# Patient Record
Sex: Female | Born: 1945
Health system: Southern US, Community
[De-identification: ages and names within clinical notes are randomized; demographics above are authoritative.]

## PROBLEM LIST (undated history)

## (undated) DIAGNOSIS — I10 Essential (primary) hypertension: Secondary | ICD-10-CM

## (undated) DIAGNOSIS — M1990 Unspecified osteoarthritis, unspecified site: Secondary | ICD-10-CM

## (undated) DIAGNOSIS — E119 Type 2 diabetes mellitus without complications: Secondary | ICD-10-CM

## (undated) DIAGNOSIS — J449 Chronic obstructive pulmonary disease, unspecified: Secondary | ICD-10-CM

## (undated) DIAGNOSIS — G8220 Paraplegia, unspecified: Secondary | ICD-10-CM

## (undated) DIAGNOSIS — K219 Gastro-esophageal reflux disease without esophagitis: Secondary | ICD-10-CM

## (undated) DIAGNOSIS — E079 Disorder of thyroid, unspecified: Secondary | ICD-10-CM

## (undated) DIAGNOSIS — N12 Tubulo-interstitial nephritis, not specified as acute or chronic: Secondary | ICD-10-CM

## (undated) DIAGNOSIS — J4489 Other specified chronic obstructive pulmonary disease: Secondary | ICD-10-CM

## (undated) DIAGNOSIS — F319 Bipolar disorder, unspecified: Secondary | ICD-10-CM

## (undated) DIAGNOSIS — E669 Obesity, unspecified: Secondary | ICD-10-CM

## (undated) DIAGNOSIS — R0600 Dyspnea, unspecified: Secondary | ICD-10-CM

## (undated) HISTORY — PX: ABDOMINAL HYSTERECTOMY: SHX81

## (undated) HISTORY — PX: BACK SURGERY: SHX140

## (undated) HISTORY — PX: CARPAL TUNNEL RELEASE: SHX101

## (undated) HISTORY — PX: BUNIONECTOMY: SHX129

---

## 2006-06-28 ENCOUNTER — Ambulatory Visit: Payer: Self-pay | Admitting: Cardiology

## 2006-06-28 ENCOUNTER — Encounter: Payer: Self-pay | Admitting: Cardiology

## 2006-06-28 ENCOUNTER — Inpatient Hospital Stay (HOSPITAL_COMMUNITY): Admission: AD | Admit: 2006-06-28 | Discharge: 2006-07-06 | Payer: Self-pay | Admitting: Internal Medicine

## 2006-06-28 ENCOUNTER — Ambulatory Visit: Payer: Self-pay | Admitting: Pulmonary Disease

## 2006-06-29 ENCOUNTER — Encounter (INDEPENDENT_AMBULATORY_CARE_PROVIDER_SITE_OTHER): Payer: Self-pay | Admitting: Specialist

## 2006-06-30 ENCOUNTER — Encounter (INDEPENDENT_AMBULATORY_CARE_PROVIDER_SITE_OTHER): Payer: Self-pay | Admitting: Specialist

## 2006-07-01 ENCOUNTER — Encounter (INDEPENDENT_AMBULATORY_CARE_PROVIDER_SITE_OTHER): Payer: Self-pay | Admitting: Specialist

## 2006-07-03 ENCOUNTER — Ambulatory Visit: Payer: Self-pay | Admitting: Physical Medicine & Rehabilitation

## 2006-07-06 ENCOUNTER — Ambulatory Visit: Payer: Self-pay | Admitting: Internal Medicine

## 2006-07-06 ENCOUNTER — Inpatient Hospital Stay (HOSPITAL_COMMUNITY)
Admission: RE | Admit: 2006-07-06 | Discharge: 2006-07-28 | Payer: Self-pay | Admitting: Physical Medicine & Rehabilitation

## 2006-07-06 ENCOUNTER — Ambulatory Visit: Payer: Self-pay | Admitting: Physical Medicine & Rehabilitation

## 2006-08-09 ENCOUNTER — Ambulatory Visit: Payer: Self-pay | Admitting: Cardiology

## 2006-08-18 ENCOUNTER — Ambulatory Visit: Payer: Self-pay | Admitting: Physician Assistant

## 2006-09-13 ENCOUNTER — Ambulatory Visit (HOSPITAL_COMMUNITY): Admission: RE | Admit: 2006-09-13 | Discharge: 2006-09-13 | Payer: Self-pay | Admitting: Radiology

## 2007-05-16 ENCOUNTER — Ambulatory Visit: Payer: Self-pay | Admitting: Orthopedic Surgery

## 2007-05-16 DIAGNOSIS — G5600 Carpal tunnel syndrome, unspecified upper limb: Secondary | ICD-10-CM

## 2007-06-07 ENCOUNTER — Encounter: Payer: Self-pay | Admitting: Orthopedic Surgery

## 2007-06-25 ENCOUNTER — Ambulatory Visit: Payer: Self-pay | Admitting: Orthopedic Surgery

## 2007-07-10 ENCOUNTER — Ambulatory Visit (HOSPITAL_COMMUNITY): Admission: RE | Admit: 2007-07-10 | Discharge: 2007-07-10 | Payer: Self-pay | Admitting: Orthopedic Surgery

## 2007-07-10 ENCOUNTER — Encounter: Payer: Self-pay | Admitting: Orthopedic Surgery

## 2007-07-12 ENCOUNTER — Ambulatory Visit: Payer: Self-pay | Admitting: Orthopedic Surgery

## 2007-07-13 ENCOUNTER — Ambulatory Visit: Payer: Self-pay | Admitting: Orthopedic Surgery

## 2007-07-23 ENCOUNTER — Ambulatory Visit: Payer: Self-pay | Admitting: Orthopedic Surgery

## 2007-07-25 ENCOUNTER — Telehealth: Payer: Self-pay | Admitting: Orthopedic Surgery

## 2007-09-03 ENCOUNTER — Ambulatory Visit: Payer: Self-pay | Admitting: Orthopedic Surgery

## 2007-09-03 DIAGNOSIS — M654 Radial styloid tenosynovitis [de Quervain]: Secondary | ICD-10-CM | POA: Insufficient documentation

## 2007-09-03 DIAGNOSIS — M19049 Primary osteoarthritis, unspecified hand: Secondary | ICD-10-CM | POA: Insufficient documentation

## 2007-09-13 ENCOUNTER — Telehealth: Payer: Self-pay | Admitting: Orthopedic Surgery

## 2007-09-17 ENCOUNTER — Encounter: Payer: Self-pay | Admitting: Orthopedic Surgery

## 2007-09-18 ENCOUNTER — Encounter: Payer: Self-pay | Admitting: Orthopedic Surgery

## 2007-09-21 ENCOUNTER — Telehealth: Payer: Self-pay | Admitting: Orthopedic Surgery

## 2007-11-19 ENCOUNTER — Ambulatory Visit: Payer: Self-pay | Admitting: Orthopedic Surgery

## 2007-11-19 DIAGNOSIS — M25519 Pain in unspecified shoulder: Secondary | ICD-10-CM

## 2010-08-10 NOTE — H&P (Signed)
[name], [name]                ACCOUNT NO.:  [id number]   MEDICAL RECORD NO.:  [id number]          PATIENT TYPE:  AMB   LOCATION:  SDS                          FACILITY:  [facility]   PHYSICIAN:  Delton See, P.A.   DATE OF BIRTH:  1946/01/10   DATE OF ADMISSION:  09/13/2006  DATE OF DISCHARGE:                              HISTORY & PHYSICAL   ADDENDUM   Please see the history and physical dictated on September 01, 2006.   LABORATORY DATA:  INR is 1.0, PT 13.1, PTT 35.  CBC:  Reveals hemoglobin  11.8, hematocrit 35.2, WBCs 5.6, platelets 264,000, BUN 9, creatinine  0.59, GFR greater than 60, glucose 103, potassium 3.7.  A chest x-ray is  pending.   REVIEW OF SYSTEMS:  Completely negative except for some recent wheezing.  She had a headache yesterday which is unusual for her.  She has  bowel and bladder incontinence as a result of her spinal cord injury.  She had an indwelling Foley catheter.  She is a T11 paraplegic.  She  reports bruising easily on anti-platelet therapy.  She quit smoking in  April.   PHYSICAL EXAMINATION:  Reveals a pleasant, alert, 65 year old white  female in no acute distress.  VITAL SIGNS:  Blood pressure 130/86, pulse 64, respirations 20,  temperature 97.6.  HEENT:  Unremarkable.  Her airway is rated at a 1.  NECK:  Reveals no bruits.  HEART:  Reveals regular rate and rhythm without murmur.  LUNGS:  Reveal slightly decreased breath sounds with wheezing on the  right.  ABDOMEN:  Obese, soft, nontender.  EXTREMITIES:  Reveal pulses to be intact with trace edema.  Her ASA Scale is a 4.  NEUROLOGICAL EXAM:  Mental status:  Patient is alert and oriented and  follows commands.  Cranial nerves II-XII are grossly intact.  Sensation  is intact to light touch in the upper extremities.  She has no sensation  in the lower  extremities.  Motor strength is 5/5 in the upper extremities.  There is  no movement in either lower extremity except for an occasional spasm.  Cerebellar testing is intact in the upper extremities.   For impression and plan, please see the H&P dictated yesterday.      Delton See, P.A.     DR/MEDQ  D:  09/13/2006  T:  09/13/2006  Job:  [id number]   cc:   Danae Orleans. Venetia Maxon, M.D.  Bevelyn Buckles. Bensimhon, MD  Roylene Reason A. Orlin Hilding, M.D.

## 2010-08-10 NOTE — Assessment & Plan Note (Signed)
[name]                          [name]   [name], [name]                       MRN:          [id number]  DATE:08/18/2006                            DOB:          Feb 21, 1946    PRIMARY CARE PHYSICIAN:  Dr. Donzetta Sprung.   HISTORY OF PRESENT ILLNESS:  [name] is a 65 year old female patient  with a fairly complicated Snyder course recently.  She initially  presented to [name] in transferred from [name]  with concerns for a non-ST elevation myocardial infarction.  There were  some concerns of a intraabdominal process ongoing and her EKG was non-  acute and her cardiac catheterization was placed on hold.  I looked into  her records, she did have elevations in her enzymes.  Troponin was as  high as 1.58.  There were some concerns of pyelonephritis, but there  were no signs of infection on testing.  She eventually developed some  severe back spasms and a MRI showed diffuse epidural spinal abscess.  She went to the operating room and had a large subdural and subarachnoid  hemorrhage removed.  She is now a paraplegic.  She eventually was  discharged to rehab.  She denied any chest discomfort.  She spent some  time in rehab and had a followup spinal angiogram prior to discharge.  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  The patient returns to our office today for  followup.  She denies chest pain, shortness of breath.  Denies syncope,  near syncope.  Denies orthopnea, paroxysmal nocturnal dyspnea.  Denies  any lower extremity edema.  Denies any palpitations.   CURRENT MEDICATIONS:  1. Synthroid 25 mcg daily.  2. Aspirin 81 mg daily.  3. HCTZ 25 mg daily.  4. Potassium 20 mEq daily.  5. Coreg 12.5 mg b.i.d.  6. Seroquel 25 mg nightly.  7. Prilosec 20 mg daily.  8. Os-Cal.  9. Dulcolax.  10.Senokot.  11.Zocor 20 mg nightly.  12.Mirapex 0.125  mg nightly.  13.Tylox p.r.n.  14.Flexeril p.r.n.   ALLERGIES:  No known drug allergies.   PHYSICAL EXAMINATION:  She is a well-nourished, well-developed female in  no acute distress arriving in a wheelchair.  Blood pressure 126/86, pulse 67.  HEENT:  Normal.  NECK:  Without JVD at 90 degrees.  CARDIAC:  Normal S1, S2, regular rate and rhythm without murmurs.  LUNGS:  Clear to auscultation bilaterally without wheezing, rhonchi or  rales.  ABDOMEN:  Soft, nontender with normal bowel sounds, no organomegaly.  EXTREMITIES:  Without edema, calves soft and nontender.  SKIN:  Warm and dry.  NEUROLOGIC:  She is alert and oriented x3, cranial nerves II-XII grossly  intact.   Electrocardiogram reveals sinus rhythm with a heart rate of 63, no acute  changes.   DATABASE:  Recent adenosine Myoview study revealing a ejection fraction  of 47% with global hypokinesis.  Images suggestive of reversible defects  in the anterior and inferior distribution - this was reviewed with Dr.  Andee Lineman.  He notes  that these changes were not definite and this was not  felt to be a high risk scan.   IMPRESSION:  1. Probable underlying coronary artery disease with recent non-ST      elevation myocardial infarction.      a.     Low risk Myoview study as noted above with a ejection       fraction of 47%.  2. 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  A  Resulting paraplegia.  B.  Subset neurogenic bowel and bladder.  1. Hypertension.  2. Hyperlipidemia.  3. Hypothyroidism.  4. Bipolar disorder.  5. Gastroesophageal reflux disease.   PLAN:  The patient presents back to the office today for followup.  From  a cardiovascular standpoint she is doing well.  She denies any chest  pain or shortness of breath.  Her Myoview scan is noted above and was  reviewed with Dr. Andee Lineman.  This is  fairly low risk and the changes are  not definite.  I discussed with the patient.  Given the fact that she  probably needs ongoing neurosurgery followup and would not be a  candidate for anticoagulation at this time and the fact that she is not  having any symptoms with a low risk Myoview scan, we have decided to  proceed with medical therapy.  She is in agreement to this.  She was on  Benicar prior to admission.  I think it would be best for her to be back  on a ACE inhibitor or a ARB.  Therefore, I have discontinued her HCTZ  and potassium and placed her on Benicar 10 mg a day.  We will get a BMET  in a week and have her followup with Dr. Andee Lineman in 6 weeks.  She knows  to contact us for sooner followup if needed.   Addendum:  Insurance will not pay for the Benicar.  She was switched to  Lisinopril 5mg  daily.      Tereso Newcomer, PA-C  Electronically Signed      Learta Codding, MD,FACC  Electronically Signed   SW/MedQ  DD: 08/18/2006  DT: 08/18/2006  Job #: [id number]   cc:   Kipp Laurence, MD

## 2010-08-10 NOTE — Op Note (Signed)
NAMECECIL, [name]                ACCOUNT NO.:  [id number]   MEDICAL RECORD NO.:  [id number]          PATIENT TYPE:  AMB   LOCATION:  DAY                           FACILITY:  [facility]   PHYSICIAN:  Vickki Hearing, M.D.DATE OF BIRTH:  April 19, 1945   DATE OF PROCEDURE:  07/10/2007  DATE OF DISCHARGE:                               OPERATIVE REPORT   HISTORY:  This is a 65 year old female with paraplegia who essentially  is an ambulator with assistive devices and primarily uses her upper  extremities, who has longstanding right carpal tunnel syndrome  documented by nerve conduction studies in 1998 and again in 2009.  She  was treated with Neurontin, right carpal tunnel splints did not improve,  symptoms became unbearable, and she presented for surgery.   PREOPERATIVE DIAGNOSIS:  Right carpal tunnel syndrome.   POSTOPERATIVE DIAGNOSIS:  Right carpal tunnel syndrome.   PROCEDURE:  Right carpal tunnel release.   SURGEON:  Vickki Hearing, MD.   ANESTHETIC:  Bier block.   OPERATIVE FINDINGS:  She had a severely compressed, flattened, and  discolored median nerve.  There were no space-occupying lesions.   DETAILS OF PROCEDURE:  The patient identified in the preop holding area  as [name], right hand was marked for surgery, countersigned by the  surgeon.  History and physical was updated.  The patient was taken to  surgery, given Ancef and a Bier block.  After successful Bier block, the  right hand was prepped and draped using sterile technique.  An incision  was made on the radial side of the ring finger from the distal aspect of  the carpal tunnel to the distal wrist, transverse crease, subcutaneous  tissue was divided, and palmar fascia was also divided sharply.  The  distal aspect of the transverse carpal ligament was explored with blunt  dissection.  Blunt dissection was carried down beneath the ligament and  the ligament was released sharply.  The release of the  transverse carpal  ligament was carried approximately with blunt dissection using scissors.  Carpal tunnel was opened, irrigated, explored, and then the incision was  closed with 3-0 nylon interrupted sutures.  10 mL of 0.5% Marcaine was  injected on the radial side of the incision and sterile bandages were  applied.  Tourniquet was released.  The fingertips were pink with good  capillary refill and color.  The patient was taken to recovery room in  stable condition.   POSTOPERATIVE PLAN:  Follow up in 2 days, sutures out in 10-12.  She can  take Percocet for pain.  She is to keep the hand iced, elevated, and she  is allowed to move the fingers.      Vickki Hearing, M.D.  Electronically Signed     SEH/MEDQ  D:  07/10/2007  T:  07/10/2007  Job:  [id number]

## 2010-08-10 NOTE — H&P (Signed)
[name], [name]                ACCOUNT NO.:  [id number]   MEDICAL RECORD NO.:  [id number]           PATIENT TYPE:   LOCATION:                                 FACILITY:   PHYSICIAN:  Marin Roberts, MDDATE OF BIRTH:  February 02, 1946   DATE OF ADMISSION:  09/13/2006  DATE OF DISCHARGE:                              HISTORY & PHYSICAL   CHIEF COMPLAINT:  Spinal cord injury with subsequent T11 paraplegia.   HISTORY OF PRESENT ILLNESS:  This is a very unfortunate 65 year old  female who was seen at [facility] in [place] with a 5-day history of  back pain.  While there she ruled in for a non-ST elevation MI.  She was  transferred to [facility] on June 28, 2006, where she was  admitted by Bevelyn Buckles. Bensimhon, MD.  Shortly after admission she  developed acute onset of T11 paraplegia and was seen in consultation by  Gustavus Messing. Orlin Hilding, M.D., on June 30, 2006.  On July 01, 2006, the  patient was taken to the operating room by Dr. Venetia Maxon as it was suspected  that she had a spinal epidural abscess; however, while in the OR the  patient underwent a T8 through T12 laminectomy for resection of a  subarachnoid and subdural hematoma with obliteration of an intradural  arteriovenous malformation.  The patient was subsequently admitted to  [facility] from April 10-Jul 28, 2006.  Shortly prior to discharge from the hospital the patient had a spinal  arteriogram performed by Dr. Alfredo Batty on Jul 27, 2006.  This showed a  possible fistula on the right at T12.  The patient was also noted to  have residual clot within the canal.  Following a discussion with Dr.  Venetia Maxon, an additional follow-up study was recommended in 6-8 weeks.  The  patient is to be admitted to [facility] on September 13, 2006, for  the follow-up spinal arteriogram to be performed by Dr. Alfredo Batty under  general anesthesia.   PAST MEDICAL HISTORY:  1. 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  She is status      post T8 through T12 laminectomy performed on July 01, 2006, by Dr.      Venetia Maxon.  2. She also has a history of a non-ST elevation MI during that      admission.  A 2 D echo revealed an ejection fraction of 60-65% at      that time.  Following discharge, the patient was seen by the      cardiologist in [place].  An adenosine Myoview was performed.  At that      time her ejection fraction was 47%.  She had global hypokinesis,      although this was felt to be a low-risk study for ongoing ischemia.  3. The patient has a history of hyperlipidemia.  4. History of hypertension.  5. Gastroesophageal reflux disease.  6. Hypothyroidism.  7. Bipolar disorder.  8. Exercise-induced asthma.  9. A  history of renal calculi.  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  11.She has a history of osteoarthritis.   SURGICAL HISTORY:  1. A hysterectomy.  2. She has had multiple lumbar surgeries.  3. She has had carpal tunnel surgery.   She has had previous problems with anesthesia resulting in nausea and  vomiting and headaches.   ALLERGIES:  The patient is allergic to CODEINE.   Medications at the time of her last cardiac follow-up included aspirin,  Synthroid, Coreg, Seroquel, Prilosec, Os-Cal, Dulcolax, Senokot, Zocor,  Mirapex, Tylox, Flexeril and Benicar.   SOCIAL HISTORY:  The patient is separated.  I believe she has several  children.  She lives in [place].  She has a history of tobacco use.  She  uses alcohol rarely.  She has been disabled for some time.   FAMILY HISTORY:  Her mother's medical history is unknown.  Her father  died at an early age from cirrhosis.  She has a brother and a sister  with coronary artery disease.   Review of systems, laboratory data and physical exam is currently  pending with a complete dictation to follow.    IMPRESSION:  1. History of T11 paraplegia secondary to a spinal cord injury felt      secondary to an intradural arteriovenous malformation with      subsequent hemorrhage.  2. Status post T8 through T12 laminectomy for resection of      subarachnoid and subdural hematomas.  3. Coronary artery disease with non-ST elevation myocardial infarction      in April 2008.  4. Recent adenosine Myoview in May 2008 revealing global hypokinesis      with an ejection fraction of 47% but felt to be at low risk for      ischemia.  5. History of hyperlipidemia.  6. History of hypertension.  7. History of tobacco use.  8. Gastroesophageal reflux disease.  9. Hypothyroidism.  10.Bipolar disorder.  11.Exercise-induced asthma.  12.History of renal calculi.  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  14.History of osteoarthritis.  15.Status post multiple lumbar surgeries as well as carpal tunnel      surgery and hysterectomy.  16.Allergy to CODEINE.  17.History of tobacco use.  18.History of spinal arteriogram performed under general anesthesia on      Jul 27, 2006, by Dr. Marin Roberts revealing a possible      fistula on the right at T12 with a residual clot within the canal.      A follow-up study has been recommended.      Delton See, P.A.      Marin Roberts, MD  Electronically Signed    DR/MEDQ  D:  09/12/2006  T:  09/12/2006  Job:  [id number]   cc:   Danae Orleans. Venetia Maxon, M.D.  Bevelyn Buckles. Bensimhon, MD  Roylene Reason A. Orlin Hilding, M.D.  Ellwood Dense, M.D.  Lonia Blood, M.D.

## 2010-08-10 NOTE — H&P (Signed)
[name], [name]                ACCOUNT NO.:  [id number]   MEDICAL RECORD NO.:  [id number]          PATIENT TYPE:  AMB   LOCATION:  SDS                          FACILITY:  [facility]   PHYSICIAN:  Marin Roberts, MDDATE OF BIRTH:  28-May-1945   DATE OF ADMISSION:  09/13/2006  DATE OF DISCHARGE:                              HISTORY & PHYSICAL   ADDENDUM.   Please send copies to the previously noted physicians.   CURRENT MEDICATIONS:  1. Phenylephrine nasal decongestant p.r.n.  2. Omeprazole 20 mg daily.  3. Synthroid 0.125 mg daily.  4. Lisinopril 10 mg daily.  5. Carvedilol 12.5 mg b.i.d.  6. Vytorin 10/40 one daily.  7. Mirapex 0.125 mg daily.  8. Seroquel 25 mg at bedtime.  9. Oxycodone 5/500 q.4 h. p.r.n.  10.Aspirin 81 mg daily.  11.Calcium with vitamin D twice daily.  12.Sennosides 8.6 mg three tablets Monday, Wednesday and Friday.  13.Bisacodyl 10 mg suppository Monday, Wednesday and Friday.      Delton See, P.A.      Marin Roberts, MD  Electronically Signed    DR/MEDQ  D:  09/13/2006  T:  09/13/2006  Job:  [id number]

## 2010-08-10 NOTE — H&P (Signed)
NAMEQUINLEY, [name]                ACCOUNT NO.:  [id number]   MEDICAL RECORD NO.:  [id number]          PATIENT TYPE:  AMB   LOCATION:  DAY                           FACILITY:  [facility]   PHYSICIAN:  Vickki Hearing, M.D.DATE OF BIRTH:  1945-11-18   DATE OF ADMISSION:  07/10/2007  DATE OF DISCHARGE:  LH                              HISTORY & PHYSICAL   CHIEF COMPLAINT:  Pain and paresthesias of the right upper extremity.   HISTORY OF PRESENT ILLNESS:  This is a 65 year old female who presented  to [place] with pain and paresthesias for several years.  She is paralyzed  from the waist down.  She uses her upper extremities for ambulation.  She reports that her whole hand is numb.  Her symptoms worsen in the  right ring finger with radiation up towards her shoulder.  She reports  weakness in the right upper extremity.   She has worn carpal tunnel braces at night and sometimes during the day  taking Percocet and naproxen as well as a pain patch, but did not get  relief.  She denies any neck pain.  She had a nerve conduction study  done in 1994, which was read as bilateral carpal tunnel syndrome.   PAST MEDICAL HISTORY:  She has hypertension, high cholesterol.  She is  bipolar.  She has T12 paraplegia, hypothyroidism, migraines, asthma,  fatty liver, stage III chronic renal disease, reflux, kidney stones,  osteopenia, and coronary artery disease.   PAST SURGICAL HISTORY:  Hysterectomy, bunionectomy, lumbar laminectomy,  left carpal tunnel release, right ureteral stent, and T8 and T12  laminectomy.   FAMILY HISTORY:  Coronary artery disease and arthritis.   SOCIAL HISTORY:  She is retired.  She is separated.  Risk factors  include no alcoholic beverage use.  No smoking.   REVIEW OF SYSTEMS:  She complains of fatigue, COPD, reflux, numbness,  joint pain, joint swelling, osteoporosis, thyroid disease, depression,  anxiety, bipolar disorder, poor vision, sinusitis, hoarseness, and  seasonal  allergies.  The other review of system was negative.   PHYSICAL EXAMINATION:  VITAL SIGNS:  Weight 246, pulse 78, and  respiratory rate 16.  GENERAL:  Appearance, she is well developed and well nourished.  She has  normal body habitus and normal grooming.  SKIN:  She has intact skin without lesions, rashes, or cafe-au-lait  spots.  No bruising.  VASCULAR EXAM:  Normal upper extremity pulses and capillary refill.  No  ischemia, clubbing, or cyanosis.  MOTOR EXAM:  Decreased right hand grip.  Strength to soft touch is  intact on the right with negative compression test and negative Phalen  test at the wrist.  Normal reflexes are noted in the upper extremities.  Her range of motion shows flexion of 60 degrees passive, and extension  of 50 degrees at the wrist.  Tinel sign is negative at the carpal  tunnel.   She is diagnosed with right carpal tunnel syndrome 354.0.   ASSESSMENT AND PLAN:  I would say, this is atypical carpal tunnel  syndrome, however, based on her failed treatment with Neurontin  100 mg 3  times a day and continued symptoms, positive nerve studies.  We were  able to give new nerve studies, which documented her carpal tunnel  syndrome.   I related to her that her chances of success of 80% with surgery,  especially in terms of weakness, which is one of her primary problem. I  discussed the open procedure with her.  I answered her questions.  We  discussed bleeding, infection, and neurovascular injury.  We discussed  the atypical nature of her symptoms.  She still wanted to go head and  have the surgery done with the 80% chance of success.  So, she will have  a right carpal tunnel release.      Vickki Hearing, M.D.  Electronically Signed     SEH/MEDQ  D:  07/09/2007  T:  07/10/2007  Job:  [id number]   cc:   [facility]  Fax: [phone]

## 2010-08-13 NOTE — H&P (Signed)
[name], [name]                ACCOUNT NO.:  [id number]   MEDICAL RECORD NO.:  [id number]          PATIENT TYPE:  IPS   LOCATION:  4011                         FACILITY:  [facility]   PHYSICIAN:  Ellwood Dense, M.D.   DATE OF BIRTH:  05-12-1945   DATE OF ADMISSION:  07/06/2006  DATE OF DISCHARGE:                              HISTORY & PHYSICAL   NEUROLOGIST:  Santina Evans A. Orlin Hilding, M.D.   PRIMARY CARE:  Donzetta Sprung, M.D., in [place].   CARDIOLOGIST:  Bevelyn Buckles. Bensimhon, M.D.   HISTORY OF THE PRESENT ILLNESS:  Ms. Osier is a 65 year old Caucasian  female with history of multiple lumbar laminectomies along with bipolar  disorder.   The patient was admitted into [facility] June 28, 2006, with  severe back pain and spasms for 5 days duration.  She was noted to have  acute loss of lower extremity movement and decreased sensation.  MRI  scan of the spine showed T9-T12 thoracic cord compression consistent  with spinal epidural abscess initially.   The patient underwent T8-T12 laminectomy with resection of a  subarachnoid and subdural hemorrhage with intradural arteriovenous  malformation noted July 01, 2006, with surgery performed by Dr. Venetia Maxon.   The patient was seen postoperatively by Dr. Gala Romney for elevated  troponin levels, although the EKG showed no acute abnormalities.  There  was questionable small non-ST-wave myocardial infarction reported at  that time.  Echocardiogram showed an ejection fraction of 60-65%.  No  anticoagulation was recommended by CVTS.  She was placed on sequential  hose on her lower extremities.  There was a questionable need for  aspirin, which was not decided on initially until they discussed that  plan with Dr. Venetia Maxon.  Cardiac status remained stable with no acute plan  for further workup except a Cardiolite study in the future after acute  rehabilitation.  Pain control has been managed with a Duragesic patch 25  mcg per hour, changed q.72h.,  along with p.r.n. Percocet.  Blood  pressure has been monitored on Coreg and Lasix.   Dr. Venetia Maxon today decided to add aspirin today.  Dr. Gala Romney has  discontinued Lasix and started her on Norvasc and hydrochlorothiazide.   The patient was evaluated by the rehabilitation physicians and felt to  be an appropriate candidate for inpatient rehabilitation.   REVIEW OF SYSTEMS:  Positive for incontinence, reflux, lumbago,  numbness, weakness, and bipolar disorder.   PAST MEDICAL HISTORY:  1. Hypertension.  2. Dyslipidemia.  3. GERD.  4. Bipolar disorder treated with Seroquel q.h.s.  5. Asthma.  6. History of kidney stones with pyelonephritis.  7. Hypothyroidism.  8. History of multiple lumbar laminectomies.   FAMILY HISTORY:  Positive for coronary artery disease.   SOCIAL HISTORY:  The patient lives alone and is on disability.  She  lives in a mobile home with four to five steps to enter.  There is a  local daughter who can assist as needed per patient report.  The patient  does not use alcohol or tobacco.   FUNCTIONAL HISTORY PRIOR TO ADMISSION:  Independent.   ALLERGIES:  CODEINE.   MEDICATIONS PRIOR TO ADMISSION:  1. Seroquel 25 mg p.o. q.h.s.  2. Zetia 10 mg daily.  3. Zocor 20 mg daily.  4. Synthroid 137 mcg daily.  5. Metoprolol 12.5 mg daily.  6. Vytorin 10/20 one tablet daily.  7. Clonidine 0.2 mg b.i.d.  8. Protonix daily.  9. Avapro 300 mg daily.  10.Detrol LA 4 mg daily.   LABORATORY:  Recent hemoglobin was 12.4; hematocrit of 36.7; platelet  count of 323,000; and white count of 11.7.  Recent sodium was 131,  potassium 4.1, chloride 96, CO2 30, BUN 9, and creatinine 0.6.   PHYSICAL EXAMINATION:  GENERAL:  Well-appearing, overweight adult female  lying in bed in no acute discomfort.  VITAL SIGNS:  Blood pressure 155/95 with a pulse 65, respiratory rate  22, temperature 98.7, and O2 saturation 98% on room air.  HEENT:  Normocephalic, nontraumatic.   CARDIOVASCULAR:  Regular rate and rhythm, S1, S2, without murmurs.  ABDOMEN:  Soft, obese, nontender, with positive bowel sounds.  LUNGS:  Clear to auscultation bilaterally.  NEUROLOGIC:  Alert and oriented x3.  Cranial nerves II-XII are intact.  Bilateral upper extremity exam showed 5/5 strength throughout.  Bulk and  tone were normal and reflexes were 2+ and symmetrical.  Sensation was  intact to light touch throughout the bilateral upper extremities.  Examination of her trunk showed decreased sensation below T10 with  normal sensation above.  She has essentially no deep pressure or light  touch sensation below T10 on either leg.  Lower extremity exam showed  flaccid paralysis with 0/5 strength throughout.   IMPRESSION:  1. Status post T9-T12 laminectomy with resection of epidural      hemorrhage.  2. 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  3. 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  4. Pain control with fentanyl patch along with p.r.n. Percocet.  5. Hypertension, on multiple medications.  6. Recent non-ST-wave myocardial infarction with aspirin therapy      initiated.   Presently, the patient has deficits in ADLs, transfers, ambulation, and  bowel and bladder function secondary to the above-noted lower thoracic  cord hemorrhage with subsequent paraplegia.   PLAN:  1. Admit to the rehabilitation unit for daily therapies to include      physical therapy for range of motion, strengthening, bed mobility,      transfers, pre-gait training, gait training and equipment      evaluation.  2. Occupational therapy for range of motion, strengthening, ADLs,      cognitive/perceptual training, splinting, and equipment evaluation.  3. Rehabilitation nursing for skin care, wound care and bowel and      bladder training.  4. Case management to assess home environment, assist with discharge      planning and arrange for appropriate followup care. 5. Social worker to assess family and  social support, counsel patient      and family regarding disability issues, and assist in discharge      planning.  6. Check admission labs including CBC and CMET Friday, July 04, 2006.  7. Monitor hypertension on Coreg 12.5 mg p.o. b.i.d.,      hydrochlorothiazide 25 mg p.o. daily, and Norvasc 5 mg p.o. daily.  8. Continue aspirin 81 mg p.o. daily.  9. Seroquel 25 mg p.o. q.h.s. for bipolar disorder.  10.Zocor 20 mg p.o. daily for dyslipidemia.  11.Flexeril 10 mg p.o. t.i.d. p.r.n. for spasms.  12.Continue Foley tube to bedside drainage at present.  13.Continue Synthroid 25  mcg p.o. daily.  14.Protonix 40 mg p.o. daily.  15.PAS hose when in bed.  16.Keep heels off bed.  17.Bowel program per nursing care.  18.Sorbitol 30 mL p.o. q.12h. p.r.n.  19.Dulcolax suppository one per rectum daily p.r.n.  20.Routine turning to prevent skin breakdown.  21.Nursing instruction in bowel and bladder care along with skin care.   PROGNOSIS:  Fair.   ESTIMATED LENGTH OF STAY:  20-30 days.   GOALS:  Modified independent bed mobility with standby assist to min  assist basic transfers and min assist for more substantial transfers,  with modified independent wheelchair mobility.           ______________________________  Ellwood Dense, M.D.     DC/MEDQ  D:  07/06/2006  T:  07/06/2006  Job:  [id number]

## 2010-08-13 NOTE — Op Note (Signed)
NAMESHAWONDA, KERCE                ACCOUNT NO.:  [id number]   MEDICAL RECORD NO.:  [id number]          PATIENT TYPE:  INP   LOCATION:  3108                         FACILITY:  [facility]   PHYSICIAN:  Danae Orleans. Venetia Maxon, M.D.  DATE OF BIRTH:  [birth year]   DATE OF PROCEDURE:  07/01/2006  DATE OF DISCHARGE:                               OPERATIVE REPORT   PREOPERATIVE DIAGNOSIS:  Spinal epidural abscess T9 through T12.   POSTOPERATIVE DIAGNOSIS:  Subarachnoid and subdural hemorrhage with  intradural arteriovenous malformation.   PROCEDURE:  1. T8 through T12 laminectomy.  2. Resection of subarachnoid and subdural hematoma.  3. Obliteration of intradural arteriovenous malformation.   SURGEON:  Danae Orleans. Venetia Maxon, M.D.   ASSISTANT:  Coletta Memos, M.D.   ANESTHESIA:  General endotracheal anesthesia.   ESTIMATED BLOOD LOSS:  Approximately 600 mL.   COMPLICATIONS:  None.   DISPOSITION:  Neuro ICU.   INDICATIONS:  Loren Vicens is a 65 year old woman with acute onset of  paralysis in both lower extremities and had an MRI which was suggestive  of a spinal epidural abscess.  It was elected to take her emergently to  the operating room.   DESCRIPTION OF PROCEDURE:  The patient was brought to the operating  room. Following satisfactory and uncomplicated induction of general  endotracheal anesthesia and placement of intravenous lines, including  central line and arterial line, she was placed in the prone position on  a Wilson frame.  Her mid back was then prepped and draped in the usual  sterile fashion.  The area of planned incision was infiltrated with  0.25% Marcaine and 0.5% lidocaine with 1:100,000 epinephrine.  An  incision was made over what was felt to be from T8 through T12 level and  carried through copious adipose tissue to the posterior thoracolumbar  fascia which was incised bilaterally with electrocautery.  Subperiosteal  dissection was performed, exposure was performed from what  was felt to  be T8 through L1 levels and this was confirmed on intraoperative x-ray  with an AP film. The total laminectomy of the inferior portion of T8,  T9, T10, T11, and T12, was then performed.   I anticipated finding an epidural spinal abscess but I did and found,  instead, tense dura which appeared bluish and discolored suggestive of  an intradural process. Following the laminectomy, the dura was incised  from T8 to T12 in the midline and this revealed subarachnoid hemorrhage  as well as subdural blood.  There did not appear to be signs of  significant infection.  There was a tremendous amount of clotted blood  in the spinal canal and the microscope was brought into the field and  because of my concern for what appeared to be subarachnoid hemorrhage  and possible arteriovenous malformation, my partner came in to assist  me.  He helped me under the operating microscope to very carefully  removed sheets of laminated blood suggestive of multi-age hemorrhage and  then to remove blood, both ventral to the cord as well as lateral to the  cord.   On right side of midline  at approximately the T10 level, there was a  densely adherent area with arterial bleeding and what appeared to be  arterialized veins and these were cauterized with bipolar cautery as  well as what appeared to be a feeding artery to this abnormality and  this was all carefully resected and hemostasis was assured.  The  intradural space was then irrigated copiously with saline, approximately  2 liters of saline, with clearing of blood and subarachnoid space and  irrigation was also performed caudad and cephalad along the spinal  canal.  Subsequently, the dura was then closed with running 6-0 Prolene  stitches and was a watertight closure. The wound was then closed with #1  Vicryl sutures reapproximating the fascia, 2-0 Vicryl inverted sutures  reapproximated the subcutaneous tissues, and a running 3-0 nylon stitch.  The  wound was dressed with a sterile occlusive dressing.  The patient  was taken intubated to the neuro ICU for further care.      Danae Orleans. Venetia Maxon, M.D.  Electronically Signed     JDS/MEDQ  D:  07/01/2006  T:  07/01/2006  Job:  [id number]

## 2010-08-13 NOTE — Consult Note (Signed)
[name], [name]                ACCOUNT NO.:  [id number]   MEDICAL RECORD NO.:  [id number]          PATIENT TYPE:  INP   LOCATION:  3108                         FACILITY:  [facility]   PHYSICIAN:  Lonia Blood, M.D.       DATE OF BIRTH:  04/20/1945   DATE OF CONSULTATION:  DATE OF DISCHARGE:                                 CONSULTATION   REQUESTING PHYSICIAN:  Dr. [name]   REASON FOR CONSULTATION:  Back pain and abdominal pain.   HISTORY OF PRESENT ILLNESS:  Mrs. Batie is a 65 year old woman with  past medical history of hypertension, hyperlipidemia who woke up 5 days  prior to admission with severe back pain and spasms.  The patient  presented to her primary care physician who admitted the patient to  [facility] on April 1st with the presumed diagnosis of  pyelonephritis.  The patient was noted to have significant leukocytosis,  sepsis-like picture and also to have some elevated troponins.  Because  of the elevated troponin, the patient was promptly transferred to [facility] to the cardiology service.  The patient though continued  to deteriorate to a point where her pain is much worse now and she  currently reports that she is unable to move her lower extremities.  The  patient denies any severe abdominal pain but reports she has got some  diffuse abdominal discomfort and she overall does not feel like she  wants to eat anything.   PAST MEDICAL HISTORY:  1. Hypertension.  2. Hyperlipidemia.  3. Hypothyroidism.  4. Gastroesophageal reflux disease.  5. Chronic back pain status post diskectomy in the past.   CURRENT MEDICATIONS:  1. Norvasc 5 mg daily.  2. Unasyn intravenously.  3. Aspirin.  4. Duragesic patch.  5. NovoLog insulin sliding scale.  6. Labetalol 200 mg three times a day.  7. Tylenol.  8. Xanax.  9. Flexeril as needed.  10.Milk of Magnesia as needed.  11.Nitroglycerin drip.   PATIENT'S SOCIAL HISTORY:  She is separated.  She smokes a  half pack of  cigarettes a day.  Does not drink alcohol.  She is on disability.   PATIENT'S FAMILY HISTORY:  Positive for cirrhosis in father and possible  coronary artery disease in brother and sister.   REVIEW OF SYSTEMS:  Positive for depression, anxiety, diffuse body  aches, severe headache, deconditioning.  Other systems as per HPI.  All  other systems negative.   PHYSICAL EXAMINATION:  Temperature 97.7, blood pressure is 137/71, heart  rate 86, saturation about 95% on 2 liters of oxygen.  The patient is in  no acute distress, lying in the bed.  Alert, oriented to place, person  and time.  HEAD:  Normocephalic, atraumatic.  EYES:  Pupils equal, round, react to light and accommodation.  Extraocular movements intact.  THROAT:  Clear.  NECK:  Supple.  No JVD, no carotid bruits.  CHEST:  Clear to auscultation bilaterally without wheezes, rhonchi or  crackles.  HEART:  Shows regular rate and rhythm without murmurs, rubs or gallops.  ABDOMEN:  Obese and soft.  Bowel sounds are present.  LOWER EXTREMITIES:  Have +1 edema.  Objectively, the patient cannot move  the lower extremities that I can tell.   LABORATORY VALUES:  CK is 1635.  ESR is 52, sodium 137, potassium 4.5,  chloride 106, bicarb 23, BUN 12, creatinine 0.6.  abdominal ultrasound  shows steatosis.  AST 74, ALT 47, white blood cell count 16,000,  hemoglobin 11.  CT scan of the abdomen and pelvis is negative for  anything acute.  Urine culture and blood cultures, no growth to date.   IMPRESSION/RECOMMENDATIONS:  1. Severe back pain of unclear etiology, but I shared concern with the      neurologist consultation that is seeing the patient right now, that      this could be an epidural abscess.  The patient is on her way to a      stat MRI of her back.  This also could be an incomplete treated      pyelonephritis but this is really less likely with a completely      normal renal ultrasound, normal CT scan of the abdomen and  pelvis      and 2 negative urine cultures.  I was actually able to obtain the      results of the urine culture at [facility] and it was      negative for pathogens.  Sepsis-like picture, this is probably      related to an infectious process that remains obscure at this point      in time.  Continuous monitoring of the patient is appropriate until      further testing can be done.  2. 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  3. Mild rhabdomyolysis secondary to the sepsis:  The patient will be      kept on intravenous fluids and her CK total will be closely      monitored.  4. Impaired glucose tolerance:  Outpatient diet and exercise and      follow up with her primary care physician.  We will follow along      this patient with you and adjust treatment as needed depending on      results of the findings.      Lonia Blood, M.D.  Electronically Signed     SL/MEDQ  D:  06/30/2006  T:  07/01/2006  Job:  [id number]

## 2010-08-13 NOTE — Consult Note (Signed)
NAMESUMAYYA, MUHA                ACCOUNT NO.:  [id number]   MEDICAL RECORD NO.:  [id number]          PATIENT TYPE:  INP   LOCATION:  3108                         FACILITY:  [facility]   PHYSICIAN:  Danae Orleans. Venetia Maxon, M.D.  DATE OF BIRTH:  [birth year]   DATE OF CONSULTATION:  DATE OF DISCHARGE:                                 CONSULTATION   DATE OF CONSULTATION:  June 30, 2006   REASON FOR CONSULTATION:  Acute pleuralgia.   HISTORY OF ILLNESS:  [name] is a 65 year old woman with an acute  loss of lower extremity movement and sensation this afternoon.  I was  called at approximately 9:30 p.m. to see about the patient.  I came to  see her about 9:40 p.m. and came to see patient at 10 p.m.  At that  point I elected to take her emergently to the operating room for  thoracic laminectomy for presumed epidural spinal abscess.  She has  flaccid paraplegia in T12 sensory level with decreased rectal tone.  She  was initially admitted to [facility] with nausea and vomiting and  was felt to have presumptively to have pyelonephritis.  She had elevated  cardiac enzymes and was brought to [facility] for rule out MI  and cath but she had [place] 956[address].  Today she complained  of bladder spasm.  This severe spasm of her back and whole body this  afternoon followed by acute paralysis of both lower extremities.  Patient was evaluated by Dr. Orlin Hilding and a MRI was obtained.  The MRI  showed T9-T12 thoracic cord compression with altered signal in the  spinal cord and with what was felt by the radiologist to be consistent  with a spinal epidural abscess.  The patient was on vancomycin and  unison and had received Lovenox this morning.  We had discussed this  with the patient's daughter and the patient plans emergent thoracic  laminectomy and evacuation of epidural spinal abscess.  The patient has  had 3 prior lumbar diskectomies by Dr. Newell Coral.  The risks of surgery  were  explained as was the significant possibility the patient may not  regain function in her lower extremities.      Danae Orleans. Venetia Maxon, M.D.  Electronically Signed     JDS/MEDQ  D:  07/01/2006  T:  07/02/2006  Job:  [id number]

## 2010-08-13 NOTE — Discharge Summary (Signed)
[name], [name]                ACCOUNT NO.:  [id number]   MEDICAL RECORD NO.:  [id number]          PATIENT TYPE:  IPS   LOCATION:  4011                         FACILITY:  [facility]   PHYSICIAN:  Ellwood Dense, M.D.   DATE OF BIRTH:  Aug 14, 1945   DATE OF ADMISSION:  07/06/2006  DATE OF DISCHARGE:  07/27/2006                               DISCHARGE SUMMARY   DISCHARGE DIAGNOSES:  1. 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  2. Neurogenic bowel and bladder.  3. Pain control.  4. Non-ST elevation myocardial infarction.  5. Hypertension.  6. Hyperlipidemia.  7. Hypothyroidism.  8. Bipolar disorder.  9. Gastroesophageal reflux disease.   HISTORY:  This is a 65 year old white female, history of multiple lumbar  laminectomies, bipolar disorder admitted by [facility] April 10  with severe back pain and spasms times 5 days.  Noted acute loss of  lower extremity movement with decreased sensation.  MRI of the spine  showed a thoracic T9-T12 thoracic cord compression consistent with  spinal epidural abscess.  Underwent thoracic T8-T12 laminectomy with  resection of subarachnoid and subdural hemorrhage with intradural  arteriovenous malformation April 5 per Dr. Venetia Maxon.  Follow-up cardiology  services for initial increase in troponin.  The EKG without acute  changes, questionable small NSTE myocardial infarction.  Echocardiogram  with ejection fraction 60 to 65%.  No anticoagulation per neurosurgery  with pulsatile stockings placed.  Cardiac status remained stable.  No  current plan for any further care at this time.  She would receive a  Cardiolite study in the future.  Pain control with Duragesic patch.  Blood pressures monitored with Coreg and Lasix.  Aspirin had been added  to regimen after discussed with neurosurgery per cardiology services.  She was admitted for comprehensive rehab program.   PAST MEDICAL HISTORY:  See discharge diagnoses.   ALLERGIES:  CODEINE.   SOCIAL HISTORY:  No alcohol or tobacco.   Lives alone.  She is on disability.  Local daughter can assist.   MEDICATIONS:  Medications prior to admission were Seroquel, Zetia,  Zocor, Synthroid, metoprolol, Vytorin, Clonidine, Protonix, Avapro,  Detrol and Prilosec.   REHABILITATION HOSPITAL COURSE:  The patient was admitted to inpatient  rehab services with therapies initiated on a 3-hour daily basis  consisting of physical therapy, occupational therapy and rehabilitation  nursing.  The following issues were addressed during the patient's  rehabilitation stay.  Pertaining to Mrs. Magid complete thoracic spinal  cord injury, epidural abscess with paraplegia, surgical site of thoracic  T9-T12 laminectomy, resection of epidural abscess was healing nicely.  Sutures had been removed.  Foley catheter tube remained in place for  neurogenic bowel and bladder.  Initial attempts at intermittent  catheterizations failed as the patient was not able to participate with  this procedure and limited support of family again for catheterizations.  Pain control ongoing.  She remained on low-dose fentanyl patch as well  as Tylox  for breakthrough pain.  Blood pressures were monitored with  follow-up per cardiology services.  She remained on hydrochlorothiazide,  Norvasc and Coreg with diastolic pressures 73, 74.  She denied any  headache or dizziness.  During her rehabilitation course she did have  some elevated white blood cell counts of 34,800 on April 14.  She had  been placed on Primaxin.  Workup of urine study and chest x-ray  negative.  Follow-up MRI of thoracic spine April 14 showed postoperative  changes.  There was some mild residual fluid collection posteriorly at  thoracic T6-T7.  No cord compression noted.  She remained on Primaxin.  White count improved to with 7.5  ultimately.  Her Primaxin had been  discontinued on April 21 and monitored.  She remained on Synthroid for  hypothyroidism.  She had a documented history of bipolar disorder.  She  remained on Seroquel 25 mg at bedtime.  Functionally she was simple set  up supervision for upper body dressing in the sitting position, minimal  assist for sliding board transfers, max assist car transfers, propelling  her wheelchair with supervision.  She was discharged to home with  family.   Latest labs showed a hemoglobin 10.8, hematocrit 31.7, WBC 7.5,  platelets 370,000.  Sodium 132, potassium 4.1, BUN 12, creatinine of  0.8.   Discharge medications at time of dictation included Synthroid 25 mcg  daily, fentanyl patch 25 mcg change every 72 hours, aspirin 81 mg daily,  hydrochlorothiazide 25 mg daily, Norvasc 5 mg daily, potassium chloride  20 mEq daily, Coreg 12.5 mg twice daily, Zocor 20 mg daily, Seroquel 25  mg at bedtime, Prilosec 20 mg daily, Os-Cal 500 mg daily, Ambien 10 mg  at bedtime as needed, Tylox 5-325 mg one or two tablets every 6 hours as  needed pain dispense of 60 tablets.   ACTIVITY:  As tolerated.   DIET:  Regular.   SPECIAL INSTRUCTIONS:  Continue therapies as advised per rehab services.   FOLLOW UP:  Follow-up with Dr. Ellwood Dense outpatient rehab services  as advised.  Dr. Kerri Perches neurosurgery.  Follow-up  cardiology  services for Cardiolite study in the near future.      [name], P.A.    ______________________________  Ellwood Dense, M.D.    DA/MEDQ  D:  07/26/2006  T:  07/26/2006  Job:  [id number]   cc:   Ellwood Dense, M.D.  Catherine A. Orlin Hilding, M.D.  Donzetta Sprung  Dr. Hermenia Fiscal D. Venetia Maxon, M.D.

## 2010-08-13 NOTE — H&P (Signed)
NAMESAYDA, GRABLE                ACCOUNT NO.:  [id number]   MEDICAL RECORD NO.:  [id number]          PATIENT TYPE:  INP   LOCATION:  2807                         FACILITY:  [facility]   PHYSICIAN:  Bevelyn Buckles. Bensimhon, MDDATE OF BIRTH:  14-Mar-1946   DATE OF ADMISSION:  06/28/2006  DATE OF DISCHARGE:                              HISTORY & PHYSICAL   PRIMARY CARDIOLOGIST:  Bevelyn Buckles. Bensimhon, MD.   CHIEF COMPLAINT:  Ms. Espaillat is a 65 year old Caucasian female initially  admitted to [facility] with complaints of vomiting, sweat, chills  and back pain that started around 4 a.m. on April 1st.  The back pain  started around 2 p.m. the prior day.  She states she was unable to keep  any medicines down.  She went to her primary care physician's office, a  Dr. Dimas Aguas, up in [employer] [employer].  The patient was sent to the emergency room from  there around 4 o'clock on the 1st.  On arrival to [facility] Emergency  Room, the patient was found to be afebrile.  However, she was very  diaphoretic, pale, complained of back pain and flank pain and nauseated.  Her blood pressure was 206/106 with a heart rate of 55, temp of 97.9.  She was satting 100 on 2 L.  The patient had a stat CT scan done of the  pelvis and lumbar spine.  CT scan showed perinephric stranding on the  right side, most compatible with pyelonephritis.  The patient was  treated with morphine, Tylenol, Zofran and Ativan with mild relief in  symptoms.  EKG showed sinus tach with no acute ST or T wave changes.  For some reason, it was decided to check cardiac markers.  The patient  was found to have a troponin of 1.88 and a CK-MB of 23.8.  Second set,  troponin 2.54, CK-MB 27.1.  The patient also had a flu culture that was  negative, strep screen that was negative.  Urinalysis showed moderate  blood, 10-20 epithelials, moderate bacteria and no WBCs.  The patient's  blood sugar level was elevated at 159.  BUN and creatinine within normal  at 16  and 0.9.  The patient was also found to have lipase within normal  limits, SGOT elevated at 74 and an SGPT at 59.  Chest x-ray showed no  acute findings.  The patient was transferred to ICU at [facility].  In the ICU, her vital signs remained reasonably stable  although she was still hypertensive and extremely diaphoretic.  At 3  a.m. she began having sinus tachycardia at a rate of 110-140 in the  setting of elevated troponin.  It was felt that her nausea and  diaphoresis were most likely related to a cardiac event.  EKG was  repeated that showed no significant changes.  She was started on IV  nitroglycerin and received an injection of Lovenox.  Dr. Dimas Aguas then  spoke with Joellyn Rued, PA-C here at [facility] with request to  transfer the patient here for further evaluation.  The patient was taken  immediately to the cath lab where  it was noted she had a WBC count of  23,000.  She was still extremely diaphoretic, denying any chest pain.  Dr. Gala Romney in to examine the patient.   PAST MEDICAL HISTORY:  1. Hypertension.  2. Hypercholesterolemia.  3. GERD.  4. Bipolar disorder.  5. Exercise-induced asthma.  6. History of kidney stones and pyelonephritis.  Questionable, the      patient may have a urinary stent.  7. Hypothyroidism.  8. Chronic back pain.  9. Osteoarthritis.  10.Status post discectomy times three.  She states she is disabled.   ALLERGIES:  CODEINE.   MEDICATIONS:  1. Seroquel 25 mg daily at bedtime.  2. Zetia 10 mg.  3. Zocor 20 mg.  4. Synthroid 112 mcg and 25 mcg daily.  5. Metoprolol 12.5 mg daily.  6. Vytorin 10/20.  7. Clonidine 0.2 twice daily.  8. Protonix.  9. Avapro 300.  10.Aspirin 325.  11.Detrol LA.  12.Fluticasone nasal spray for allergies.   SOCIAL HISTORY:  She lives in [place] alone.  She is disabled.  She has an  adult daughter.  She smokes one pack a day of cigarettes.  No exercise.  Has an occasional cocktail while watching  [employer].  Diet is  regular.   FAMILY HISTORY:  Essentially unknown, did not know her mother.  Father  deceased at an early age secondary to cirrhosis.  She has a brother and  sister who she states she thinks has coronary artery disease.   REVIEW OF SYSTEMS:  Positive for chills, sweats, headache, hoarseness,  shortness of breath, dyspnea on exertion, occasional palpitations,  increased frequency and urgency with voiding, generalized weakness over  the last few days, depression, anxiety, myalgia, arthralgia, pain in  back, nausea, vomiting, GERD symptoms and flank pain.   PHYSICAL EXAMINATION:  VITAL SIGNS:  Temp currently is 98.8 here, pulse  108, respirations 20, blood pressure 152/109.  She is satting 96% on 2  L.  GENERAL:  She is in no acute distress.  However, her skin is very warm  to touch.  Her face is flushed.  She is extremely diaphoretic.  HEENT:  Normocephalic atraumatic.  Pupils equal, round and reactive to  light.  Sclerae are clear.  NECK:  Supple without lymphadenopathy.  No bruits.  No JVD.  CARDIOVASCULAR:  S1 and S2.  Tachycardic.  Pulses 2+ and equal without  bruits.  LUNGS:  Clear to auscultation, somewhat distant breath sounds.  SKIN:  Flushed, warm, diaphoretic.  ABDOMEN:  Soft.  She has some tenderness in her left lower quadrant.  Otherwise, denies any discomfort.  Positive bowel sounds.  EXTREMITIES:  Lower extremities without clubbing, cyanosis or edema.  NEUROLOGICAL:  She is alert and oriented times three.  Cranial nerves II-  XII grossly intact.   LABORATORY DATA:  Chest x-ray at [facility] showing no acute findings.  CT  impression was pyelonephritis.  EKG:  Sinus tach without ST or T wave  changes.  Lab work done at [facility] this morning shows a WBC of  23, H&H 14.3 and 42, platelets 368,000.  Sodium 138, potassium 3.4,  chloride 101, CO2 21, BUN 16, creatinine 0.9 with a glucose of 159. Cardiac markers as stated above.  UA as stated  above.  Liver enzymes  elevated, as stated above.   IMPRESSION AND PLAN:  Dr. Jesusita Oka Bensimhon in to examine and assess the  patient with non-ST elevated myocardial infarction and questionable  pyelonephritis, questionable cholecystitis, stable from cardiac  perspective  at this time.  We will check an abdominal ultrasound, renal  ultrasound, echo, blood culture, urine culture, start Unasyn, continue  nitroglycerin, start heparin.  Plan on cathing once other issues are  sorted out.      Dorian Pod, ACNP      Bevelyn Buckles. Bensimhon, MD  Electronically Signed    MB/MEDQ  D:  06/28/2006  T:  06/28/2006  Job:  [id number]

## 2010-08-13 NOTE — Discharge Summary (Signed)
[name], DAUENHAUER                ACCOUNT NO.:  [id number]   MEDICAL RECORD NO.:  [id number]          PATIENT TYPE:  INP   LOCATION:  3028                         FACILITY:  [facility]   PHYSICIAN:  Bevelyn Buckles. 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   SECONDARY DIAGNOSES:  1. Probable non-ST-elevation myocardial infarction.  2. History of chronic back pain status post multiple surgeries.  3. History of nephrolithiasis.  4. Hypertension.   HISTORY OF PRESENT ILLNESS:  For complete history of present illness,  please see dictated H&P on June 28, 2006.   HOSPITAL COURSE:  [name] is a 65 year old woman with a history of  obesity, chronic back pain secondary to degenerative disc disease status  post multiple surgeries as well as nephrolithiasis and hypertension.  On  April 1, she presented to Dr. Jeannette How office in [place] complaining of  right flank pain, vomiting, sweats and chills.  She was unable to keep  any medicines down.  Dr. Dimas Aguas evaluated her and sent her to the  [facility] emergency room, she was diaphoretic and pale at the time.  Initial blood pressure was 206/106, she was afebrile, however had a  significant leukocytosis.  Abdominal CT was performed which showed right  perinephric stranding suggestive of pyelonephritis.  A urinalysis was  obtained which showed copious amounts of blood, but no significant  pyuria.  Cardiac markers were checked and these were elevated.  She  continued to have flank and abdominal pain with some radiation to her  chest so she was sent emergently to the [facility] catheterization lab  for possible catheterization.  I evaluated her in the holding area, at  that time her EKG was nonacute,  denied any chest pain, but did have some  mild abdominal and flank pain.  Her white count was 23,000.  At that  point, the decision was made to forego catheterization to further  evaluate what was thought to be an intraabdominal process.  Most  probably pyelonephritis or possible nephrolithiasis of cholecystitis.  Given the possible non-ST-elevation myocardial infarction, she was  continued on heparin.  Abdominal and renal ultrasound were ordered and  she was started on Unasyn.   Her abdominal ultrasound showed diffuse fatty infiltration of her liver  with a normal gallbladder and no evidence of hydronephrosis.  On the  floor, she continued to have ongoing back and flank pain, she said this  was fairly typical for her and just felt would feel better if she would  get out of bed.  She was evaluated by Dr. Laverle Patter in urology who had also  some concerns over pyelonephritis, but this was confounded by the lack  of pyuria or bacteria in her urine so I thought this was less likely.  Urine cytology was ordered to further evaluate her hematuria and a  hematuria protocol CT was ordered.  [name] continued  to complain of  chronic back pain and was requiring high amounts of  pain medication.   On April 4 given her ongoing symptoms, full abdominal and pelvis CT scan  was obtained with and without contrast to rule out abdominal aortic  dissection and to rule out any other intraabdominal process.  This  showed no evidence of intraabdominal abscess or inflammatory process,  there was a colonic ileus and diffuse fatty infiltration of the liver.  There was mention of lower lumbar spine degenerative and postoperative  changes, but no acute spinal process.  Later that day, the patient  experienced what she called a severe back spasm and developed acute  numbness and weakness in her lower extremities.  Neurology was consulted  and came to evaluate her immediately.  There was some concern over a  cord  compression.  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  Dr. Venetia Maxon from neurology was consulted and she was  taken immediately to the operating room for presumed epidural abscess.   In the operating room, it was discovered that the patient had a very  large subdural and subarachnoid hemorrhage with multi-age blood from  what appeared to be acute and chronic bleeding.  The blood was evacuated  and the patient was found to have arteriovenous malformation which was  cauterized.  Postoperatively, the patient had persistent paraplegia and  was insensate from the waist down.   Throughout the remainder of her hospital course she denied any chest  pain or shortness of breath.  Given her lack of symptoms and her  inability to use any further anticoagulants, invasive cardiac workup was  deferred and patient was treated medically.  Her antihypertensive  regimen was also titrated.  She is being transferred today to the rehab  floor.  Dr. Venetia Maxon from neurosurgery has recommended following up with a  spinal arteriogram in one week.   MEDICATIONS ON DISCHARGE:  Will be:  1. Fentanyl patch.  2. Protonix 40 a day.  3. Synthroid 25 a day.  4. Lasix 20 mg daily.  5. Potassium 20 daily.  6. Coreg 12.5 b.i.d.  7. Simvastatin 20.  8. Norvasc 5 mg a day.  9. Aspirin 81 mg a day.   FOLLOWUP AFTER DISCHARGE:  Will be with Dr. Venetia Maxon in neurosurgery for  followup arteriogram, she will also follow up with me, Dr. Gala Romney, in  the [facility] as scheduled.   Total time of discharge encounter is 45 minutes.      Bevelyn Buckles. Bensimhon, MD  Electronically Signed     DRB/MEDQ  D:  07/06/2006  T:  07/06/2006  Job:  [id number]

## 2010-08-13 NOTE — Consult Note (Signed)
[name], [name]                ACCOUNT NO.:  [id number]   MEDICAL RECORD NO.:  [id number]          PATIENT TYPE:  INP   LOCATION:  3316                         FACILITY:  [facility]   PHYSICIAN:  Heloise Purpura, MD      DATE OF BIRTH:  1945-09-22   DATE OF CONSULTATION:  06/29/2006  DATE OF DISCHARGE:                                 CONSULTATION   REQUESTING PHYSICIAN:  Bevelyn Buckles. Bensimhon, M.D.   REASON FOR CONSULTATION:  Pyelonephritis.   HISTORY:  [name] is a 65 year old female with a history of chronic  back pain, who is status post three procedures on her lumbar spine by  Dr. Newell Coral in the past.  She awoke from sleep approximately 3 days ago  with nausea and vomiting.  She then began having the onset of right  lower back pain approximately 2 p.m. the following day.  She did have  radiation of this pain to her right hip.  She denies any fever.  She was  therefore evaluated by her primary care physician and subsequently sent  to the emergency room at [facility].  She was admitted to  [facility], after she was found to have elevated cardiac enzymes  consistent with a non-ST elevated myocardial infarction.  She was noted  to have a leukocytosis with a white blood count of 23,000.  Based on the  fact that she had back pain, and a urinalysis, and CT scan, she was felt  to have pyelonephritis and was begun on empiric antibiotic therapy.  She  was subsequently sent to [facility] further cardiac treatment  and evaluation.   GENITOURINARY REVIEW OF SYSTEMS:  The patient does have a history of  overactive bladder that has been managed with Detrol.  Recently, her  urinary urgency and frequency have worsened.  She denies any dysuria.  She will have occasional urinary incontinence associated with her urge.  She denies a history of gross hematuria.  She does have a history of  kidney stones and is status post a ureteroscope procedure approximately  1 year ago.   She denies a history of GU surgery or trauma.   PAST MEDICAL HISTORY:  1. Hypertension.  2. Hypercholesterolemia.  3. Gastroesophageal reflux disease.  4. Bipolar disorder.  5. Exercise-induced asthma.  6. History of kidney stones.  7. Hypothyroidism.  8. Chronic back pain.  9. Osteoarthritis.   PAST SURGICAL HISTORY:  1. The patient has undergone a hysterectomy.  2. Ureteroscopic stone removal.  3. Lumbar back surgery three times.   MEDICATIONS:  Home medications include Seroquel, Zetia, Zocor,  Synthroid, Metoprolol, Vytorin, Clonidine, Protonix, Avapro, aspirin,  Detrol LA,  Fluticasone nasal spray.   CURRENT MEDICATIONS:  Unasyn, aspirin, Lovenox, Vytorin, Lopressor,  insulin, K-Dur, Xanax, Flexeril, Guaifenesin, Dilaudid, nitroglycerin,  Zofran,   ALLERGIES:  CODEINE.   FAMILY HISTORY:  The patient was not raised by her biological family.  She states that there is a significant history of coronary artery  disease.  She denies any knowledge of GU malignancy in the family.   SOCIAL HISTORY:  The patient  is a long-time smoker.  She smokes about a  half pack of cigarettes per day and has done this since age 73.  She  drinks alcohol only occasionally.  She lives alone.   REVIEW OF SYSTEMS:  A complete review of systems was performed.  Pertinent positives include a history recently of headache, hoarseness,  sweats, chills, shortness of breath, dyspnea on exertion, occasional  palpitations, generalized fatigue and weakness, anxiety, myalgias,  arthralgias, nausea, vomiting, and flank pain.  All other systems are  reviewed and are otherwise negative.   PHYSICAL EXAMINATION:  VITAL SIGNS:  The patient is currently afebrile  and with stable vital signs.  CONSTITUTIONAL:  Well-nourished and well-developed, obese woman who is  in no acute distress.  HEENT:  Normocephalic, atraumatic, oropharynx is clear.  CARDIOVASCULAR:  Regular rate and rhythm without obvious murmurs.   LUNGS:  Clear bilaterally.  ABDOMEN:  Obese, soft, nontender, nondistended without abdominal masses  or bruits.  BACK:  No CVA tenderness.  GU:  Normal female genitalia.  The patient has an in-dwelling Foley  catheter draining grossly clear urine.  EXTREMITIES:  Trace bilateral lower extremity edema.  NEUROLOGIC:  The patient does have some numbness over the lateral aspect  of her right upper thigh.  Otherwise no obvious focal deficits.   OUTSIDE RECORDS:  The patient's records from [facility] were  reviewed.  Specifically, the urinalysis demonstrated 75-100 red blood  cells.  However, this was nitrite and leukocyte esterase negative.  There were no white blood cells present.  There was moderate bacteria  and 10-20 epithelial cells, most likely consistent with a contaminated  specimen and unlikely to represent infection.  This does represent  significant hematuria, however.  The patient also had a serum creatinine  of 0.9.  Her white blood count was noted to be 23,000 on admission to  [facility].  The patient also had a CT scan performed in [place],  which was independently reviewed today.  This demonstrates bilateral  renal lesions consistent with simple cysts.  There is no hydronephrosis  or evidence of ureteral obstruction.  There is a very small 1-to-2-mm  lower pole left renal calculus which appears to be non-obstructing.  These findings do not indicate any obvious urologic source for the  patient's pain from a urologic standpoint.  The patient does have some  mild stranding around her right kidney.   CURRENT LABORATORY RESULTS AND IMAGING:  The patient's current  creatinine is 0.98.  Her white blood count is currently 18,000,  hemoglobin 13.4.  Blood cultures are negative, but currently pending.  The patient did undergo an abdominal ultrasound which was independently reviewed.  This demonstrates that the patient's renal lesions noted on  her non-enhanced CT scan were  consistent with simple renal cysts.  No  evidence of hydronephrosis or renal masses.   IMPRESSION:  1. Microscopic hematuria.  2. Right back pain.   RECOMMENDATIONS:  Based on the patient's physical examination as well as  reviewing her laboratory results and imaging studies, I think that she  most likely does not have pyelonephritis.  Her urinalysis at [facility] is more consistent with a contaminated specimen and not true  infection.  However, I think in light of not having another obvious  source for her leukocytosis, it would be reasonable to consider empiric  treatment, despite the fact her overall clinical scenario is not  consistent.  Regardless, there does not appear to be any evidence of  ureteral obstruction  which would require intervention from a urologic  standpoint at this time.   The patient does appear to have significant hematuria.  This will  require further evaluation with a hematuria protocol CT scan.  The  patient will also require a urine cytology which will be sent today.  She will then need followup with a urologist for outpatient cystoscopy.  Thank you very much for this consultation.           ______________________________  Heloise Purpura, MD  Electronically Signed     LB/MEDQ  D:  06/29/2006  T:  06/29/2006  Job:  [id number]   cc:   Bevelyn Buckles. Bensimhon, MD

## 2010-08-13 NOTE — Discharge Summary (Signed)
[name], HARVIE                ACCOUNT NO.:  [id number]   MEDICAL RECORD NO.:  [id number]          PATIENT TYPE:  IPS   LOCATION:  4011                         FACILITY:  [facility]   PHYSICIAN:  Ellwood Dense, M.D.   DATE OF BIRTH:  06-19-1945   DATE OF ADMISSION:  07/06/2006  DATE OF DISCHARGE:  07/28/2006                               DISCHARGE SUMMARY   ADDENDUM:  The patient initially scheduled for discharge Jul 27, 2006,  however, follow-up per neurosurgery had recommended spinal angiogram  prior to discharge.  Thus this was arranged for Jul 27, 2006, discharge  extended to Jul 28, 2006.  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  However, there was a contribution from a distal branch near the  presumed fistula site to the anterior spinal artery.  This was discussed  with Dr. Kerri Perches of neurosurgery who elected not to treat the fistula  at this time but to review the images with outside consultation and  possibly plan treatment at a future date.  This had all been discussed  with the patient and family.  [name] remained medically stable  throughout this ordeal.  Discharge set for Jul 28, 2006.  She was  discharged in stable condition.  No medication changes were noted from  previous body of prior discharge summary.      [name], P.A.    ______________________________  Ellwood Dense, M.D.    DA/MEDQ  D:  07/28/2006  T:  07/28/2006  Job:  [id number]

## 2010-08-13 NOTE — Consult Note (Signed)
NAMEANNALEIGHA, WOO                ACCOUNT NO.:  [id number]   MEDICAL RECORD NO.:  [id number]          PATIENT TYPE:  INP   LOCATION:  3316                         FACILITY:  [facility]   PHYSICIAN:  Gustavus Messing. Orlin Hilding, M.D.DATE OF BIRTH:  December 13, 1945   DATE OF CONSULTATION:  06/30/2006  DATE OF DISCHARGE:                                 CONSULTATION   NEUROLOGY CONSULTATION NOTE:   CHIEF COMPLAINT:  Leg numbness and weakness.   HISTORY OF PRESENT ILLNESS:  [name] is a 65 year old white woman  with past medical history significant for multiple back surgeries with  chronic back pain syndrome and history of bipolar disorder who presented  to [facility] with nausea and vomiting and fever, chills,  diaphoresis, unable to keep medication down, with back pain and flank  pain.  She was felt to likely have pyelonephritis with some elevated  blood pressures.  She was treated with medication.  While she was there,  an EKG showed some sinus tachycardia and cardiac enzymes were obtained  and they were positive, so she was transported to [facility] for question of a  non Q wave MI.  It was felt that she required a catheterization, so she  was sent over for that.  However, she had an elevated white blood cell  count and could not have a catheterization.  Since she has been here,  she has been complaining again of back pain because she is unable to get  up in a chair.  She says lying flat is uncomfortable.  She has had a  Foley in and has been complaining of what she calls bladder spasms even  though she really cannot be urinating with a Foley in.  These tend to be  associated with spasms of her whole body including her back and she said  that starting yesterday she began to have some tingling and numbness in  her legs.  Today she had a bad spasm of her bladder which triggered a  spasm of her whole back with arching of her back and what sounds like an  opisthotonic kind of posturing.   Following that she had immediate  weakness of her legs. She could not move them at all, and they were  completely numb.  The back pain is less now.   REVIEW OF SYSTEMS:  Negative for chest pain.  She does have the nausea  and the vomiting.  Negative for dizziness or blurry vision, double  vision.  No stool incontinence.  She does have what she calls spasms of  her abdomen all over.   PAST MEDICAL HISTORY:  Significant for hypertension, hyperlipidemia,  morbid obesity, GERD, bipolar disorder, exercise-induced asthma, history  of kidney stones and previous pyelonephritis, hypothyroidism,  osteoarthritis, remote hysterectomy for precancerous changes, multiple  lumbar laminectomies, followed by Dr. Newell Coral, on disability because of  this, carpal tunnel surgery, previous history of spastic bladder and  stress incontinence.   MEDICATIONS:  At home, have been Seroquel 25 mg nightly, Zetia 10 mg a  day, Zocor 20 mg daily, Synthroid 137 mcg daily, metoprolol 12.5 mg  daily, Vytorin 10/20 once a day, clonidine 0.2 twice a day, Protonix,  Avapro 300 mg daily, aspirin 325 mg daily, Detrol LA probably 4 mg, and  fluticasone nasal spray.   In the hospital she is also on Norvasc 5 mg a day, Unasyn 3 gm IV q.6h.  for 3 days, aspirin 125 mg daily, fentanyl 25 mcg every 72 hours,  sliding scale insulin, labetalol, Tylenol, Xanax, Flexeril, guaifenesin,  hydroxyzine, Dilaudid, Percocet, and morphine.   ALLERGIES:  TO CODEINE.   SOCIAL HISTORY:  No alcohol or drugs.  She does smoke, a pack and a half  of cigarettes a day.  She does use hydrocodone for her back pain.   FAMILY HISTORY:  Positive for heart disease.   OBJECTIVE:  VITAL SIGNS:  On exam, temperature is 97.  Blood pressure is  137/71, heart rate 86, respirations 13, 95% saturation on 2 liters.  GENERAL:  She is obese.  She is comfortable-appearing.  She does not  appear to be in distress or to be concerned about her situation.  HEAD:   Normocephalic, atraumatic.  NECK:  Supple.  ABDOMEN:  Obese.  She has lots of bruisings from injections and IV sites  around her arms.  NEUROLOGIC EXAM:  She is awake, alert, and appropriate, with normal  language.  Cranial nerves are grossly intact without any asymmetry.  On  her motor exam, she has normal upper extremities 5/5 strength, good  grips.  Her lower extremities are completely flaccid with no movement at  all.  They are externally rotated.  Reflex exam:  Reflexes are trace in  the upper extremities, absent in the lower extremities.  Downgoing toes  to plantar stimulation.  She cannot do coordination of lower  extremities.  Sensory:  There seems to be a level at about L1 on both  sides to pin prick.  She does have superficial abdominal reflexes, both  upper and lower.  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  It is not possible to really comment on the  condition of the spinal cord or conus.  Per  the resident (Dr. Liliane Channel)  exam, rectal tone is lax.   IMPRESSION:  Sudden onset of paraplegia, possibly lower thoracic upper  lumbar level.  Rule out a cord lesion or compression lesion.  Could be  psychogenic but this would be a diagnosis of exclusion.   RECOMMENDATIONS:  Stat MRI of the T-spine to include the conus and  proceed from there.      Catherine A. Orlin Hilding, M.D.  Electronically Signed     CAW/MEDQ  D:  06/30/2006  T:  07/02/2006  Job:  [id number]

## 2010-12-21 LAB — BASIC METABOLIC PANEL
CO2: 27
Chloride: 102
Creatinine, Ser: 0.71
GFR calc Af Amer: >60
Sodium: 136

## 2010-12-21 LAB — CBC
Hemoglobin: 11.1 — ABNORMAL LOW
MCHC: 34.5
MCV: 85.5
RBC: 3.74 — ABNORMAL LOW

## 2010-12-21 LAB — DIFFERENTIAL
Basophils Relative: 1
Eosinophils Absolute: 0.4
Monocytes Absolute: 0.5
Monocytes Relative: 8

## 2011-01-12 LAB — DIFFERENTIAL
Basophils Absolute: 0.1
Eosinophils Relative: 0
Lymphocytes Relative: 36
Neutro Abs: 3

## 2011-01-12 LAB — CBC
Platelets: 264
RDW: 14.4 — ABNORMAL HIGH

## 2011-01-12 LAB — BASIC METABOLIC PANEL
BUN: 9
Calcium: 9.5
GFR calc non Af Amer: >60
Glucose, Bld: 103 — ABNORMAL HIGH

## 2011-01-12 LAB — APTT: aPTT: 35

## 2011-01-12 LAB — PROTIME-INR: Prothrombin Time: 13.1

## 2011-11-17 ENCOUNTER — Telehealth: Payer: Self-pay | Admitting: Orthopedic Surgery

## 2011-11-17 NOTE — Telephone Encounter (Signed)
Per [facility], ph # [phone],  Requests appointment for patient for problem: bilateral shoulder pain, per the facility physician.  We had requested notes for Dr. Romeo Apple to review. Notes received.  Dr. Romeo Apple recommends: 1 - Get Xrays 2 - Can't evaluate here; evaluation will have to be done at facility  I had called and left messages at [facility] beginning 11/11/11.  Follow up calls through 11/17/11, and reached Patsy, [facility] contact person, on 11/17/11.  Relayed Dr. Mort Sawyers recommendations.   Order needs to be entered for Xrays and faxed to [facility] at # [phone], and to [facility], [phone].    Dr. Romeo Apple to then advise after Xray results are received.     (*NOTE* The patient had carpal tunnel release surgery by Dr. Romeo Apple on 07/10/07, per operative report in [facility] electronic records system.)

## 2012-09-03 DIAGNOSIS — E785 Hyperlipidemia, unspecified: Secondary | ICD-10-CM | POA: Diagnosis not present

## 2012-09-03 DIAGNOSIS — Z79899 Other long term (current) drug therapy: Secondary | ICD-10-CM | POA: Diagnosis not present

## 2012-09-03 DIAGNOSIS — E039 Hypothyroidism, unspecified: Secondary | ICD-10-CM | POA: Diagnosis not present

## 2012-09-11 DIAGNOSIS — B373 Candidiasis of vulva and vagina: Secondary | ICD-10-CM | POA: Diagnosis not present

## 2012-10-11 ENCOUNTER — Telehealth: Payer: Self-pay | Admitting: Orthopedic Surgery

## 2012-10-11 ENCOUNTER — Emergency Department (HOSPITAL_COMMUNITY): Payer: PRIVATE HEALTH INSURANCE

## 2012-10-11 ENCOUNTER — Encounter (HOSPITAL_COMMUNITY): Payer: Self-pay | Admitting: *Deleted

## 2012-10-11 ENCOUNTER — Emergency Department (HOSPITAL_COMMUNITY)
Admission: EM | Admit: 2012-10-11 | Discharge: 2012-10-11 | Disposition: A | Discharge status: Home / Self Care | Payer: PRIVATE HEALTH INSURANCE | Attending: Emergency Medicine | Admitting: Emergency Medicine

## 2012-10-11 DIAGNOSIS — E079 Disorder of thyroid, unspecified: Secondary | ICD-10-CM | POA: Insufficient documentation

## 2012-10-11 DIAGNOSIS — S82102A Unspecified fracture of upper end of left tibia, initial encounter for closed fracture: Secondary | ICD-10-CM

## 2012-10-11 DIAGNOSIS — S0990XA Unspecified injury of head, initial encounter: Secondary | ICD-10-CM | POA: Insufficient documentation

## 2012-10-11 DIAGNOSIS — Z791 Long term (current) use of non-steroidal anti-inflammatories (NSAID): Secondary | ICD-10-CM | POA: Insufficient documentation

## 2012-10-11 DIAGNOSIS — Y9210 Unspecified residential institution as the place of occurrence of the external cause: Secondary | ICD-10-CM | POA: Insufficient documentation

## 2012-10-11 DIAGNOSIS — E119 Type 2 diabetes mellitus without complications: Secondary | ICD-10-CM | POA: Insufficient documentation

## 2012-10-11 DIAGNOSIS — G8220 Paraplegia, unspecified: Secondary | ICD-10-CM | POA: Insufficient documentation

## 2012-10-11 DIAGNOSIS — S298XXA Other specified injuries of thorax, initial encounter: Secondary | ICD-10-CM | POA: Insufficient documentation

## 2012-10-11 DIAGNOSIS — Z87891 Personal history of nicotine dependence: Secondary | ICD-10-CM | POA: Insufficient documentation

## 2012-10-11 DIAGNOSIS — Z79899 Other long term (current) drug therapy: Secondary | ICD-10-CM | POA: Insufficient documentation

## 2012-10-11 DIAGNOSIS — Z7982 Long term (current) use of aspirin: Secondary | ICD-10-CM | POA: Insufficient documentation

## 2012-10-11 DIAGNOSIS — F319 Bipolar disorder, unspecified: Secondary | ICD-10-CM | POA: Insufficient documentation

## 2012-10-11 DIAGNOSIS — S82209A Unspecified fracture of shaft of unspecified tibia, initial encounter for closed fracture: Secondary | ICD-10-CM | POA: Insufficient documentation

## 2012-10-11 DIAGNOSIS — Y9389 Activity, other specified: Secondary | ICD-10-CM | POA: Insufficient documentation

## 2012-10-11 DIAGNOSIS — R509 Fever, unspecified: Secondary | ICD-10-CM | POA: Insufficient documentation

## 2012-10-11 DIAGNOSIS — S8990XA Unspecified injury of unspecified lower leg, initial encounter: Secondary | ICD-10-CM | POA: Diagnosis not present

## 2012-10-11 DIAGNOSIS — S82101A Unspecified fracture of upper end of right tibia, initial encounter for closed fracture: Secondary | ICD-10-CM

## 2012-10-11 DIAGNOSIS — R279 Unspecified lack of coordination: Secondary | ICD-10-CM | POA: Diagnosis not present

## 2012-10-11 DIAGNOSIS — W06XXXA Fall from bed, initial encounter: Secondary | ICD-10-CM | POA: Insufficient documentation

## 2012-10-11 DIAGNOSIS — I10 Essential (primary) hypertension: Secondary | ICD-10-CM | POA: Insufficient documentation

## 2012-10-11 HISTORY — DX: Disorder of thyroid, unspecified: E07.9

## 2012-10-11 HISTORY — DX: Essential (primary) hypertension: I10

## 2012-10-11 HISTORY — DX: Paraplegia, unspecified: G82.20

## 2012-10-11 HISTORY — DX: Bipolar disorder, unspecified: F31.9

## 2012-10-11 HISTORY — DX: Type 2 diabetes mellitus without complications: E11.9

## 2012-10-11 NOTE — Telephone Encounter (Signed)
You can tell them this and please be specific I am not to make any commentary on this patient until I see the x-rays and on not talking about a report I want to see the film

## 2012-10-11 NOTE — Telephone Encounter (Signed)
10/11/12, 12:52 p.m - attempted call back to [facility] - no answer following several rings. 10/11/12, 1:01 p.m.- same - no answer (try fax if no answer by next contact attempt)  10/11/12 2:59 p.m. - called back to [facility], asked for Tracey, not available at this time, therefore, asked for nurse, who was overhead paged, Judy Pimple.  I relayed response per Dr. Mort Sawyers note.  Per Harriett Sine, states that house physician notes that there have been conflicting reports on the mobile Xray report, so he has recommended that patient be transported to [facility].  She states EMS is there now to transport there.

## 2012-10-11 NOTE — ED Notes (Signed)
Pt Presents from [facility] secondary x-rays taken in regards to a fall sustained on 7/14. Staff states pt c/o bilateral lower extremity pain. Pt is a paraplegic x 6 yrs and has no feeling below umbilicus.  Pt reports is here for a CT of her head due to decreasing memory and headaches. Pt denies pain at this time. Pt currently has a foley catheter intact, patent and draining lt yellow urine. NAD noted.

## 2012-10-11 NOTE — ED Provider Notes (Signed)
History  This chart was scribed for Ward Givens, MD by Bennett Scrape, ED Scribe. This patient was seen in room APA14/APA14 and the patient's care was started at 3:30 PM.  CSN: [id number] Arrival date & time 10/11/12  1507  First MD Initiated Contact with Patient 10/11/12 1520     Chief Complaint  Patient presents with  . Fall  . Sent by Nursing Home     Patient is a 67 y.o. female presenting with fall. The history is provided by the patient. No language interpreter was used.  Fall This is a new problem. The current episode started more than 2 days ago. Episode frequency: once. The problem has been resolved. Associated symptoms include headaches. Nothing aggravates the symptoms. Nothing relieves the symptoms.    HPI Comments: [name] is a 67 y.o. female who is a paraplegicfrom around the umbilicus and down brought in by ambulance from [facility], who presents to the Emergency Department for radiology reports secondary to a fall. Pt states that her legs fell out of her bed  on her bed on 10/08/12 while she was being bathed. She states that she caught herself on the railing and her left ribs became pressed against the railing and her knees were bent up under her. Pt states that she was wearing her foot braces at the time but thinks she had trauma to the feet or ankles. She denies head trauma but states that since the fall she has experienced increased leg swelling. Per [facility] personnel, pt has had three different radiology reports that have conflicting results and they are requesting clarification. Right leg film was reported on the left leg. Left leg was reported as a right leg fx. Staff has tried to call radiologist but "the situation was just too confusing". Pt also reports a fever today of 99 and worsening of chronic HAs. Baseline is 97.2 and pt states EMS reported temp was 99. She denies being on oxygen at home. She reports recent upper chest congestion with dry cough that  is being treated with a nebulizer for several weeks. 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  She denies pain in her legs b/o her paraplegia, just has a lot of swelling.   PCP is Dr. Felecia Shelling NP Larose Kells with [facility] is [facility] PCP Orthopedist Dr Romeo Apple  Past Medical History  Diagnosis Date  . Paraplegic spinal paralysis   . Hypertension   . Bipolar 1 disorder   . Thyroid disease   . Diabetes mellitus without complication    Past Surgical History  Procedure Laterality Date  . Back surgery    . Abdominal hysterectomy     No family history on file. History  Substance Use Topics  . Smoking status: Former Games developer  . Smokeless tobacco: Not on file  . Alcohol Use: No  lives in NH  No OB history provided.   Review of Systems  Constitutional: Positive for fever.  HENT: Positive for congestion ("upper chest" ).   Cardiovascular: Positive for leg swelling (increased from baseline).  Neurological: Positive for headaches. Negative for syncope.  All other systems reviewed and are negative.    Allergies  Codeine and Propranolol hcl  Home Medications   Current Outpatient Rx  Name  Route  Sig  Dispense  Refill  . acetaminophen (TYLENOL) 500 MG tablet   Oral   Take 500 mg by  mouth every 6 (six) hours as needed for pain.         Marland Kitchen albuterol (PROVENTIL HFA) 108 (90 BASE) MCG/ACT inhaler   Inhalation   Inhale 2 puffs into the lungs 3 (three) times daily.         Marland Kitchen aspirin EC 81 MG tablet   Oral   Take 81 mg by mouth daily.         . baclofen (LIORESAL) 10 MG tablet   Oral   Take 10 mg by mouth 3 (three) times daily.         Marland Kitchen buPROPion (WELLBUTRIN XL) 150 MG 24 hr tablet   Oral   Take 150 mg by mouth daily.         . calcium citrate-vitamin D 500-400 MG-UNIT   Oral   Chew 1 tablet by mouth 2 (two) times daily.         . carvedilol (COREG) 12.5 MG  tablet   Oral   Take 12.5 mg by mouth 2 (two) times daily with a meal.         . clonazePAM (KLONOPIN) 1 MG tablet   Oral   Take 1 mg by mouth 3 (three) times daily.         . collagenase (SANTYL) ointment   Topical   Apply 1 application topically daily. Apply to bilateral buttocks area(s), apply calcium alginate, cover with dressing         . diclofenac (VOLTAREN) 50 MG EC tablet   Oral   Take 50 mg by mouth 2 (two) times daily.         . divalproex (DEPAKOTE) 250 MG DR tablet   Oral   Take 250 mg by mouth daily.         . fentaNYL (DURAGESIC - DOSED MCG/HR) 100 MCG/HR   Transdermal   Place 1 patch onto the skin every 3 (three) days.         . furosemide (LASIX) 40 MG tablet   Oral   Take 40 mg by mouth daily.         Marland Kitchen gabapentin (NEURONTIN) 300 MG capsule   Oral   Take 300 mg by mouth 3 (three) times daily.         Marland Kitchen glipiZIDE (GLUCOTROL XL) 5 MG 24 hr tablet   Oral   Take 5 mg by mouth daily.         . Glucosamine-Chondroitin (GLUCOSAMINE CHONDR COMPLEX PO)   Oral   Take 1 tablet by mouth 3 (three) times daily.         Marland Kitchen guaiFENesin (ROBITUSSIN) 100 MG/5ML SOLN   Oral   Take 10 mLs by mouth every 6 (six) hours as needed (for cough/congestion).         Marland Kitchen ipratropium-albuterol (DUONEB) 0.5-2.5 (3) MG/3ML SOLN   Nebulization   Take 3 mLs by nebulization every 6 (six) hours.         . iron polysaccharides (POLY-IRON 150) 150 MG capsule   Oral   Take 150 mg by mouth daily.         Marland Kitchen levothyroxine (SYNTHROID, LEVOTHROID) 200 MCG tablet   Oral   Take 200 mcg by mouth daily. Takes with Synthroid (Levothyroxine) for a total of daily at 6:00am         . levothyroxine (SYNTHROID, LEVOTHROID) 25 MCG tablet   Oral   Take 25 mcg by mouth daily. Takes with Synthroid (Levothyroxine) for a total of  daily at 6:00am         . lisinopril (PRINIVIL,ZESTRIL) 5 MG tablet   Oral   Take 5 mg by mouth daily.         Marland Kitchen  loratadine (CLARITIN) 10 MG tablet   Oral   Take 10 mg by mouth daily.         . Menthol, Topical Analgesic, (BIOFREEZE) 4 % GEL   Apply externally   Apply 1 application topically 2 (two) times daily. Applied to right and left shoulders/arms twice daily on morning and evening shifts for pain         . metFORMIN (GLUCOPHAGE) 500 MG tablet   Oral   Take 500 mg by mouth daily.         . montelukast (SINGULAIR) 10 MG tablet   Oral   Take 10 mg by mouth daily.         . Multiple Vitamin (MULTIVITAMIN WITH MINERALS) TABS   Oral   Take 1 tablet by mouth daily. DAILY-VITE         . Omega-3 Fatty Acids (SEA-OMEGA PO)   Oral   Take 1 capsule by mouth 2 (two) times daily.         Marland Kitchen omeprazole (PRILOSEC) 20 MG capsule   Oral   Take 20 mg by mouth daily.         . OxyCODONE HCl 7.5 MG TABA   Oral   Take 1 tablet by mouth every 6 (six) hours as needed (for pain).         Bertram Gala Glycol-Propyl Glycol (SYSTANE) 0.4-0.3 % SOLN   Both Eyes   Place 1 drop into both eyes 2 (two) times daily.         . polyethylene glycol powder (GLYCOLAX/MIRALAX) powder   Oral   Take 17 g by mouth daily.         . potassium chloride (K-DUR) 10 MEQ tablet   Oral   Take 10 mEq by mouth daily.         . pseudoephedrine-guaifenesin (MUCINEX D) 60-600 MG per tablet   Oral   Take 1 tablet by mouth every 12 (twelve) hours as needed for congestion.         Marland Kitchen pyridOXINE (VITAMIN B-6) 100 MG tablet   Oral   Take 100 mg by mouth 2 (two) times daily.         . rizatriptan (MAXALT) 5 MG tablet   Oral   Take 5 mg by mouth every 6 (six) hours as needed (for headache).         . SALINE NASAL MIST NA   Nasal   Place 2 sprays into the nose 3 (three) times daily.         . sertraline (ZOLOFT) 25 MG tablet   Oral   Take 25 mg by mouth daily.           Triage Vitals: BP 96/48  Pulse 79  Temp(Src) 98.5 F (36.9 C) (Oral)  Resp 22  SpO2 95%  Vital signs normal except  borderline hypotension   Physical Exam  Nursing note and vitals reviewed. Constitutional: She is oriented to person, place, and time. She appears well-developed and well-nourished.  Non-toxic appearance. She does not appear ill. No distress.  HENT:  Head: Normocephalic and atraumatic.  Right Ear: External ear normal.  Left Ear: External ear normal.  Nose: Nose normal. No mucosal edema or rhinorrhea.  Mouth/Throat: Oropharynx is clear and moist  and mucous membranes are normal. No dental abscesses or edematous.  Eyes: Conjunctivae and EOM are normal. Pupils are equal, round, and reactive to light.  Neck: Normal range of motion and full passive range of motion without pain. Neck supple.  Cardiovascular: Normal rate, regular rhythm and normal heart sounds.  Exam reveals no gallop and no friction rub.   No murmur heard. Pulmonary/Chest: Effort normal and breath sounds normal. No respiratory distress. She has no wheezes. She has no rhonchi. She has no rales. She exhibits tenderness (tenderness to left lower rib cage). She exhibits no crepitus.  Abdominal: Soft. Normal appearance and bowel sounds are normal. She exhibits no distension. There is no tenderness. There is no rebound and no guarding.  Musculoskeletal: She exhibits edema.  Moves all extremities well. Pt is insensate and is moving her legs around, bilateral legs are diffusely swollen. She is noted to have a distal LE brace to support her foot/ankle bilaterally  Neurological: She is alert and oriented to person, place, and time. She has normal strength. No cranial nerve deficit.  Skin: Skin is warm, dry and intact. No rash noted. No erythema. No pallor.  Psychiatric: She has a normal mood and affect. Her speech is normal and behavior is normal. Her mood appears not anxious.    ED Course  Procedures (including critical care time)  DIAGNOSTIC STUDIES: Oxygen Saturation is 95% on [place], normal by my interpretation.    COORDINATION OF  CARE: 3:50 PM-Discussed treatment plan which includes xrays with pt at bedside and pt agreed to plan.   Pt given results of her xrays. Pt placed in a posterior splint.    Dg Ribs Unilateral W/chest Left  10/11/2012   *RADIOLOGY REPORT*  Clinical Data: Pain and tenderness at anterior lower left ribs post fall from bed, paraplegia, cough for 7 weeks  LEFT RIBS AND CHEST - 3+ VIEW  Comparison: 09/13/2006  Findings: Enlargement of cardiac silhouette with pulmonary vascular congestion. Tortuous aorta. Hyperlucent right lung versus left similar to previous exam. No definite infiltrate, pleural effusion or pneumothorax. Bones demineralized. No definite fracture or bone destruction.  IMPRESSION: Enlargement of cardiac silhouette. Emphysematous changes with asymmetric lucency of the right lung versus left, similar to previous exam. No definite acute left rib abnormalities.   Original Report Authenticated By: Ulyses Southward, M.D.   Dg Femur Left  10/11/2012   *RADIOLOGY REPORT*  Clinical Data: Pain, fell out of bed, paraplegia  LEFT FEMUR - 2 VIEW  Comparison: None  Findings: Osseous demineralization. Degenerative changes left knee with joint space narrowing and spur formation. No femoral fracture or dislocation. Questionable deformity of the proximal tibia.  IMPRESSION: No acute left femoral abnormalities. Questionable deformity of the proximal left tibia on lateral view, please refer to report of left tibia/fibula radiographs.   Original Report Authenticated By: Ulyses Southward, M.D.   Dg Femur Right  10/11/2012   *RADIOLOGY REPORT*  Clinical Data: Larey Seat out of bed, question fractures on outside radiographs, history paraplegia  RIGHT FEMUR - 2 VIEW  Comparison: None.  Findings: Degradation of image quality secondary to body habitus. Diffuse osseous demineralization. Knee joint space narrowing. Mild widening of right hip joint is seen. No definite acute femoral fracture, dislocation, or bone destruction. Questionable  deformity of the proximal tibia on lateral view.  IMPRESSION: Marked osseous demineralization. No acute femoral fracture or dislocation. Widening of the right hip joint, nonspecific but can be seen with joint effusions. Questionable deformity of the proximal tibia, see report of right  tibia/fibula radiographs.   Original Report Authenticated By: Ulyses Southward, M.D.   Dg Tibia/fibula Left  10/11/2012   *RADIOLOGY REPORT*  Clinical Data: Pain, tenderness, fell out of bed, paraplegia  LEFT TIBIA AND FIBULA - 2 VIEW  Comparison: None  Findings: Marked osseous demineralization. Metaphyseal fracture of the proximal left tibia, mildly displaced. Unable to assess for intra-articular extension due to body habitus and underpenetration at knee. Diffuse soft tissue swelling. Remainder of tibia appears intact. Suspect fracture of fibular neck as well.  IMPRESSION: Probable fibular neck fracture. Displaced metaphyseal fracture of the proximal left tibia; unable to adequately visualize the fracture or the knee joint to exclude intra-articular extension. Consider dedicated knee radiographs or CT imaging.   Original Report Authenticated By: Ulyses Southward, M.D.   Dg Tibia/fibula Right  10/11/2012   *RADIOLOGY REPORT*  Clinical Data: Paraplegia, fell from bed, abnormal outside radiographs question fracture  RIGHT TIBIA AND FIBULA - 2 VIEW  Comparison: None  Findings: Diffuse osseous demineralization. Scattered soft tissue swelling. Underpenetration at the knee. However an oblique fracture of the proximal tibial metaphysis is identified without definite articular extension. Fibular neck is suboptimally visualized, cannot exclude fibular neck fracture. Distal aspects of the tibia and fibula appear intact.  IMPRESSION: Oblique metaphyseal fracture of the proximal right tibia. Cannot exclude fibular neck fracture.   Original Report Authenticated By: Ulyses Southward, M.D.   Dg Knee Complete 4 Views Left  10/11/2012   *RADIOLOGY REPORT*   Clinical Data: Paraplegic and fall.  LEFT KNEE - COMPLETE 4+ VIEW  Comparison: None.  Findings: Mildly displaced fractures of the proximal tibia and fibula. The proximal tibia fracture is mildly comminuted. Difficult to exclude fracture extension towards the tibial plateau. Small suprapatellar joint effusion.  The knee is located.  IMPRESSION: Mild displaced fractures of the proximal tibia and fibula. Difficult to exclude tibial plateau involvement.   Original Report Authenticated By: Richarda Overlie, M.D.   Dg Knee Complete 4 Views Right  10/11/2012   *RADIOLOGY REPORT*  Clinical Data: Paraplegic and fall.  RIGHT KNEE - COMPLETE 4+ VIEW  Comparison: Left knee 10/11/2012  Findings: Four views of the right knee were obtained.  Mildly displaced fracture of the proximal fibula.  Mildly displaced fracture of the proximal tibia.  Cortical displacement along the medial aspect of the tibial fracture. The tibial fracture may extend to the tibial plateau and articulating surface.  Evidence for a small joint effusion.  Mild degenerative changes in the knee. The knee is located.  IMPRESSION:  Mildly displaced fractures of the proximal tibia and fibula. The tibial fracture probably extends to the tibial plateau.   Original Report Authenticated By: Richarda Overlie, M.D.   Dg Foot Complete Left  10/11/2012   *RADIOLOGY REPORT*  Clinical Data: Larey Seat out of bed, paraplegia  LEFT FOOT - COMPLETE 3+ VIEW  Comparison: None  Findings: Diffuse soft tissue swelling. Marked osseous demineralization. No acute fracture, dislocation, or bone destruction. Degenerative changes first MTP joint. Small plantar calcaneal spur.  IMPRESSION: No definite acute bony abnormalities identified on exam limited by degree of demineralization.   Original Report Authenticated By: Ulyses Southward, M.D.   Dg Foot Complete Right  10/11/2012   *RADIOLOGY REPORT*  Clinical Data: Paraplegia, fell from bed  RIGHT FOOT COMPLETE - 3+ VIEW  Comparison: None  Findings: Exam  limited by severe degree of osseous demineralization. Joint spaces grossly preserved. No definite acute fracture, dislocation or bone destruction. Plantar calcaneal spur.  IMPRESSION: No definite acute osseous abnormalities identified  on exam limited by the severe degree of osseous demineralization.   Original Report Authenticated By: Ulyses Southward, M.D.    1. Fracture, tibia and fibula, proximal, right, closed, initial encounter   2. Fracture, tibia and fibula, proximal, left, closed, initial encounter     Plan discharge  Devoria Albe, MD, FACEP     MDM  patient with paraplegia who has fractures of bilateral proximal tibia/fibulas. She is not ambulatory and she was placed into a splint until she can be put in a cast. Her fractures would heal with immobilization.    I personally performed the services described in this documentation, which was scribed in my presence. The recorded information has been reviewed and considered.   Devoria Albe, MD, Armando Gang    Ward Givens, MD 10/11/12 [id number]

## 2012-10-11 NOTE — Telephone Encounter (Signed)
Received call from [facility] [place], per Gerarda Gunther, regarding this patient, resident at their facility -- states patient had Xray through mobile unit, following injury, fall, 10/08/12, and that Xray shows Fracture of left tibia.  [facility] states that patient has "immobilizer brace" on left leg, however, would need to be transported by Extra large stretcher.  She's therefore asking if Dr. Romeo Apple can see her at [facility], as stretcher would not fit through office door.  Please advise.  [facility]'s ph# is [phone].

## 2012-10-11 NOTE — ED Notes (Signed)
Pt asked to finish her meal before splints applied.

## 2012-10-11 NOTE — ED Notes (Signed)
Pt presents from [facility] facility secondary to a fall sustained on 10/08/2012. X-rays confirm a fracture lt femoral  Shaft, per Staff at [facility]. Pt c/o bilateral lower extremity pain today.

## 2012-10-15 ENCOUNTER — Telehealth: Payer: Self-pay | Admitting: Orthopedic Surgery

## 2012-10-15 NOTE — Telephone Encounter (Signed)
Call received from Hal Neer, NP, at [facility] [place], ph [phone].  Previous phone note indicates that facility had taken patient to [facility] ED 10/11/12, for injury to right and left ankle, knee, for Xrays, due to the mobile Xray unit films not providing enough information.  [place] Constable NP states no records are available at this time through [facility] as to what type of fracture, which she needs to confirm; also, does patient need to be scheduled here for fol/up; if so, how soon?  In addition, she needs to know if patient needs to be on anti-coagulants?  (We have not heard directly from scheduling side of [facility] at this point)  Please advise.

## 2012-10-15 NOTE — Telephone Encounter (Signed)
Routing to Dr Harrison 

## 2012-10-16 ENCOUNTER — Other Ambulatory Visit (HOSPITAL_COMMUNITY): Payer: Self-pay | Admitting: Internal Medicine

## 2012-10-16 DIAGNOSIS — R4182 Altered mental status, unspecified: Secondary | ICD-10-CM

## 2012-10-16 NOTE — Telephone Encounter (Signed)
I have communicated this information back to Danise Mina (Her direct ph# [phone]).  [facility] facility provided her direct phone #.  She said she still needs to know what type of fracture(s) and also needs to know if patient is to be placed on anti-coagulant.

## 2012-10-16 NOTE — Telephone Encounter (Signed)
She needs to stay in knee immobilizers for 12 week   Check skin daily

## 2012-10-16 NOTE — Telephone Encounter (Signed)
Fracture type are in xray report  anticoag per medical physician

## 2012-10-16 NOTE — Telephone Encounter (Signed)
I called back to facility, [facility], as our nurse has been unable to reach Hal Neer, NP, regarding Dr. Mort Sawyers response.  I spoke with patient's nurse at [place], Alta Corning.  Faxed the note to her attention to fax# [phone].

## 2012-10-19 ENCOUNTER — Ambulatory Visit (HOSPITAL_COMMUNITY)
Admission: RE | Admit: 2012-10-19 | Discharge: 2012-10-19 | Disposition: A | Discharge status: Home / Self Care | Payer: PRIVATE HEALTH INSURANCE | Source: Ambulatory Visit | Attending: Internal Medicine | Admitting: Internal Medicine

## 2012-10-19 DIAGNOSIS — R279 Unspecified lack of coordination: Secondary | ICD-10-CM | POA: Diagnosis not present

## 2012-10-19 DIAGNOSIS — Z7401 Bed confinement status: Secondary | ICD-10-CM | POA: Diagnosis not present

## 2012-10-19 DIAGNOSIS — R4182 Altered mental status, unspecified: Secondary | ICD-10-CM | POA: Insufficient documentation

## 2012-10-24 ENCOUNTER — Telehealth: Payer: Self-pay | Admitting: Orthopedic Surgery

## 2012-10-24 NOTE — Telephone Encounter (Signed)
[name] at [facility] called today to ask about a followup appointment for [name], which she said should be 12 weeks from 10/15/12.  Will not be able to see patient in the office due to mobility issues. Please advise

## 2012-10-25 NOTE — Telephone Encounter (Signed)
xrays both knees 8/31 send report to me

## 2012-10-25 NOTE — Telephone Encounter (Signed)
Called [facility] for Thurnell [name], she was not in today so I gave Dr.Harrison's reply to Heriberto Antigua

## 2012-11-06 DIAGNOSIS — E119 Type 2 diabetes mellitus without complications: Secondary | ICD-10-CM | POA: Diagnosis not present

## 2012-11-06 DIAGNOSIS — Z79899 Other long term (current) drug therapy: Secondary | ICD-10-CM | POA: Diagnosis not present

## 2012-11-15 ENCOUNTER — Telehealth: Payer: Self-pay | Admitting: Orthopedic Surgery

## 2012-11-15 NOTE — Telephone Encounter (Signed)
Judy Pimple, nurse at [facility] wanted you to know that [name]' knee immoblizers will not stay in place.  Said they have them wrapped with Ace wrap, but  When moving her, they are moving.   Please advise.

## 2012-11-15 NOTE — Telephone Encounter (Signed)
Tell them to Do the best you can

## 2012-11-16 NOTE — Telephone Encounter (Signed)
Gave reply to April Roberts at [facility]

## 2012-11-21 ENCOUNTER — Telehealth: Payer: Self-pay | Admitting: *Deleted

## 2012-11-21 NOTE — Telephone Encounter (Signed)
Call from [facility], nurse at [facility], stating the DON at [facility] wanted her to make you aware of the situation with this patient.  States patient has two wounds from knee immobilizer, one on the back of the right ankle measuring 1cm x 2cm and one to the back of the left ankle. The wound on the left ankle is the worse measuring 5cm x 3.5 cm, unstageable, with black eschar. The nurse practiconer ordered duoderm dressings  every 3 days, until she sees wound doctor Monday. States patient is seeing you Friday at hospital. Please advise? [phone] # to [facility]

## 2012-11-22 ENCOUNTER — Telehealth: Payer: Self-pay | Admitting: Orthopedic Surgery

## 2012-11-22 NOTE — Telephone Encounter (Signed)
Call them back   I am not seeing her at hospital Friday  My advice is to send her to a wound care center

## 2012-11-27 ENCOUNTER — Ambulatory Visit (HOSPITAL_COMMUNITY)
Admission: RE | Admit: 2012-11-27 | Discharge: 2012-11-27 | Disposition: A | Discharge status: Home / Self Care | Payer: PRIVATE HEALTH INSURANCE | Source: Ambulatory Visit | Attending: Internal Medicine | Admitting: Internal Medicine

## 2012-11-27 ENCOUNTER — Other Ambulatory Visit (HOSPITAL_COMMUNITY): Payer: Self-pay | Admitting: Internal Medicine

## 2012-11-27 DIAGNOSIS — S82202A Unspecified fracture of shaft of left tibia, initial encounter for closed fracture: Secondary | ICD-10-CM

## 2012-11-27 DIAGNOSIS — Z4789 Encounter for other orthopedic aftercare: Secondary | ICD-10-CM | POA: Insufficient documentation

## 2012-11-27 DIAGNOSIS — S82201A Unspecified fracture of shaft of right tibia, initial encounter for closed fracture: Secondary | ICD-10-CM

## 2012-11-28 ENCOUNTER — Telehealth: Payer: Self-pay | Admitting: Orthopedic Surgery

## 2012-11-28 NOTE — Telephone Encounter (Signed)
Cala Bradford Burkhart/NP at [facility] asked if you will look at the XR for [name] done yesterday at [facility].  Also said the wound care doctor has removed the splint Due to it rubbing  The sore on Kamren's heel.  Said he recommended a different type of splint  Be used if possible. Please let her know if you want to make any changes after reviewing the XR Kimberly's # [phone]

## 2012-11-29 ENCOUNTER — Other Ambulatory Visit: Payer: Self-pay | Admitting: Orthopedic Surgery

## 2012-11-29 DIAGNOSIS — S82142D Displaced bicondylar fracture of left tibia, subsequent encounter for closed fracture with routine healing: Secondary | ICD-10-CM

## 2012-11-29 DIAGNOSIS — S82141A Displaced bicondylar fracture of right tibia, initial encounter for closed fracture: Secondary | ICD-10-CM

## 2012-11-29 NOTE — Telephone Encounter (Signed)
Please send patient for knee xrays   Ok to remove braces

## 2012-11-29 NOTE — Telephone Encounter (Signed)
Left a message for [name] to call our office

## 2012-11-30 ENCOUNTER — Ambulatory Visit (HOSPITAL_COMMUNITY)
Admission: RE | Admit: 2012-11-30 | Discharge: 2012-11-30 | Disposition: A | Discharge status: Home / Self Care | Payer: PRIVATE HEALTH INSURANCE | Source: Ambulatory Visit | Attending: Orthopedic Surgery | Admitting: Orthopedic Surgery

## 2012-11-30 DIAGNOSIS — S82141A Displaced bicondylar fracture of right tibia, initial encounter for closed fracture: Secondary | ICD-10-CM

## 2012-11-30 DIAGNOSIS — S82142D Displaced bicondylar fracture of left tibia, subsequent encounter for closed fracture with routine healing: Secondary | ICD-10-CM

## 2012-11-30 DIAGNOSIS — Z4789 Encounter for other orthopedic aftercare: Secondary | ICD-10-CM | POA: Insufficient documentation

## 2012-11-30 NOTE — Telephone Encounter (Signed)
11/30/12 - Spoke with April Roberts/[facility] and gave Dr. Mort Sawyers instructions.

## 2012-12-03 DIAGNOSIS — E119 Type 2 diabetes mellitus without complications: Secondary | ICD-10-CM | POA: Diagnosis not present

## 2012-12-03 DIAGNOSIS — Z79899 Other long term (current) drug therapy: Secondary | ICD-10-CM | POA: Diagnosis not present

## 2012-12-06 ENCOUNTER — Telehealth: Payer: Self-pay | Admitting: Orthopedic Surgery

## 2012-12-06 NOTE — Telephone Encounter (Signed)
Regarding results of knee Xrays (11/30/12)- -     Please contact [facility] at [place], [facility] [phone], per call from nurse, Audelia Acton, or you may directly call nurse supervisor, Moshe Cipro, to advise. States patient is not in braces, as previously noted and advised.

## 2012-12-13 NOTE — Telephone Encounter (Signed)
Routing to Dr Harrison 

## 2012-12-13 NOTE — Telephone Encounter (Signed)
12/13/12 Call received from [facility] facility nursing supervisor, Eloisa Northern, following up on Xray results, and also asking if Dr. Romeo Apple can see the patient, possibly at [facility]?  She said "patient's fractures of both legs are not healing".  She also states they can transport patient there, if so. Please call # [phone], and ask to be connected to her directly.

## 2012-12-18 ENCOUNTER — Telehealth: Payer: Self-pay | Admitting: Orthopedic Surgery

## 2012-12-19 NOTE — Telephone Encounter (Signed)
Advice: xrays are ok worry; about the wound   dont need to call returned call already

## 2012-12-20 ENCOUNTER — Telehealth: Payer: Self-pay | Admitting: Orthopedic Surgery

## 2013-06-10 NOTE — Telephone Encounter (Signed)
Patient came into office on 06/10/13 and spoke with Nurse Javier Glazier who provider her with a script for headaches.

## 2013-09-06 NOTE — Telephone Encounter (Signed)
Patient called and advised she had appt with [facility] on 11/26/12.

## 2013-10-22 ENCOUNTER — Emergency Department (HOSPITAL_COMMUNITY): Payer: PRIVATE HEALTH INSURANCE

## 2013-10-22 ENCOUNTER — Inpatient Hospital Stay (HOSPITAL_COMMUNITY)
Admission: EM | Admit: 2013-10-22 | Discharge: 2013-10-25 | DRG: 871 | Disposition: A | Discharge status: Skilled Nursing Facility | Payer: PRIVATE HEALTH INSURANCE | Attending: Internal Medicine | Admitting: Internal Medicine

## 2013-10-22 ENCOUNTER — Encounter (HOSPITAL_COMMUNITY): Payer: Self-pay | Admitting: Internal Medicine

## 2013-10-22 DIAGNOSIS — E119 Type 2 diabetes mellitus without complications: Secondary | ICD-10-CM | POA: Diagnosis present

## 2013-10-22 DIAGNOSIS — A0472 Enterocolitis due to Clostridium difficile, not specified as recurrent: Secondary | ICD-10-CM | POA: Diagnosis not present

## 2013-10-22 DIAGNOSIS — K219 Gastro-esophageal reflux disease without esophagitis: Secondary | ICD-10-CM

## 2013-10-22 DIAGNOSIS — D649 Anemia, unspecified: Secondary | ICD-10-CM | POA: Diagnosis not present

## 2013-10-22 DIAGNOSIS — F319 Bipolar disorder, unspecified: Secondary | ICD-10-CM

## 2013-10-22 DIAGNOSIS — Z87891 Personal history of nicotine dependence: Secondary | ICD-10-CM | POA: Diagnosis not present

## 2013-10-22 DIAGNOSIS — J449 Chronic obstructive pulmonary disease, unspecified: Secondary | ICD-10-CM | POA: Diagnosis present

## 2013-10-22 DIAGNOSIS — N12 Tubulo-interstitial nephritis, not specified as acute or chronic: Secondary | ICD-10-CM | POA: Diagnosis not present

## 2013-10-22 DIAGNOSIS — I10 Essential (primary) hypertension: Secondary | ICD-10-CM

## 2013-10-22 DIAGNOSIS — N319 Neuromuscular dysfunction of bladder, unspecified: Secondary | ICD-10-CM | POA: Diagnosis not present

## 2013-10-22 DIAGNOSIS — I119 Hypertensive heart disease without heart failure: Secondary | ICD-10-CM | POA: Diagnosis not present

## 2013-10-22 DIAGNOSIS — J45909 Unspecified asthma, uncomplicated: Secondary | ICD-10-CM | POA: Diagnosis not present

## 2013-10-22 DIAGNOSIS — F411 Generalized anxiety disorder: Secondary | ICD-10-CM | POA: Diagnosis not present

## 2013-10-22 DIAGNOSIS — A419 Sepsis, unspecified organism: Principal | ICD-10-CM

## 2013-10-22 DIAGNOSIS — D72829 Elevated white blood cell count, unspecified: Secondary | ICD-10-CM | POA: Diagnosis not present

## 2013-10-22 DIAGNOSIS — E039 Hypothyroidism, unspecified: Secondary | ICD-10-CM | POA: Diagnosis present

## 2013-10-22 DIAGNOSIS — N10 Acute tubulo-interstitial nephritis: Secondary | ICD-10-CM | POA: Diagnosis present

## 2013-10-22 DIAGNOSIS — M159 Polyosteoarthritis, unspecified: Secondary | ICD-10-CM | POA: Diagnosis not present

## 2013-10-22 DIAGNOSIS — Z7982 Long term (current) use of aspirin: Secondary | ICD-10-CM | POA: Diagnosis not present

## 2013-10-22 DIAGNOSIS — J4489 Other specified chronic obstructive pulmonary disease: Secondary | ICD-10-CM | POA: Diagnosis present

## 2013-10-22 DIAGNOSIS — G8220 Paraplegia, unspecified: Secondary | ICD-10-CM | POA: Diagnosis not present

## 2013-10-22 DIAGNOSIS — I739 Peripheral vascular disease, unspecified: Secondary | ICD-10-CM | POA: Diagnosis not present

## 2013-10-22 DIAGNOSIS — Z8701 Personal history of pneumonia (recurrent): Secondary | ICD-10-CM | POA: Diagnosis not present

## 2013-10-22 DIAGNOSIS — E079 Disorder of thyroid, unspecified: Secondary | ICD-10-CM

## 2013-10-22 DIAGNOSIS — Z79899 Other long term (current) drug therapy: Secondary | ICD-10-CM | POA: Diagnosis not present

## 2013-10-22 DIAGNOSIS — G9340 Encephalopathy, unspecified: Secondary | ICD-10-CM

## 2013-10-22 DIAGNOSIS — E669 Obesity, unspecified: Secondary | ICD-10-CM | POA: Diagnosis present

## 2013-10-22 DIAGNOSIS — F329 Major depressive disorder, single episode, unspecified: Secondary | ICD-10-CM | POA: Diagnosis not present

## 2013-10-22 DIAGNOSIS — F3289 Other specified depressive episodes: Secondary | ICD-10-CM | POA: Diagnosis not present

## 2013-10-22 DIAGNOSIS — E871 Hypo-osmolality and hyponatremia: Secondary | ICD-10-CM | POA: Diagnosis not present

## 2013-10-22 DIAGNOSIS — N390 Urinary tract infection, site not specified: Secondary | ICD-10-CM

## 2013-10-22 DIAGNOSIS — R4182 Altered mental status, unspecified: Secondary | ICD-10-CM | POA: Diagnosis not present

## 2013-10-22 DIAGNOSIS — M6281 Muscle weakness (generalized): Secondary | ICD-10-CM | POA: Diagnosis not present

## 2013-10-22 HISTORY — DX: Other specified chronic obstructive pulmonary disease: J44.89

## 2013-10-22 HISTORY — DX: Tubulo-interstitial nephritis, not specified as acute or chronic: N12

## 2013-10-22 HISTORY — DX: Obesity, unspecified: E66.9

## 2013-10-22 HISTORY — DX: Gastro-esophageal reflux disease without esophagitis: K21.9

## 2013-10-22 HISTORY — DX: Chronic obstructive pulmonary disease, unspecified: J44.9

## 2013-10-22 LAB — URINALYSIS, ROUTINE W REFLEX MICROSCOPIC
Glucose, UA: NEGATIVE mg/dL
Ketones, ur: NEGATIVE mg/dL
NITRITE: NEGATIVE
PH: 7.5 (ref 5.0–8.0)
SPECIFIC GRAVITY, URINE: 1.015 (ref 1.005–1.030)
UROBILINOGEN UA: 0.2 mg/dL (ref 0.0–1.0)

## 2013-10-22 LAB — CBC WITH DIFFERENTIAL/PLATELET
BASOS PCT: 0 % (ref 0–1)
Basophils Absolute: 0 10*3/uL (ref 0.0–0.1)
EOS PCT: 0 % (ref 0–5)
Eosinophils Absolute: 0.1 10*3/uL (ref 0.0–0.7)
HEMATOCRIT: 41.2 % (ref 36.0–46.0)
HEMOGLOBIN: 13.4 g/dL (ref 12.0–15.0)
Lymphocytes Relative: 20 % (ref 12–46)
Lymphs Abs: 5.2 10*3/uL — ABNORMAL HIGH (ref 0.7–4.0)
MCH: 28 pg (ref 26.0–34.0)
MCHC: 32.5 g/dL (ref 30.0–36.0)
MCV: 86 fL (ref 78.0–100.0)
MONO ABS: 2 10*3/uL — AB (ref 0.1–1.0)
MONOS PCT: 7 % (ref 3–12)
NEUTROS ABS: 19.1 10*3/uL — AB (ref 1.7–7.7)
Neutrophils Relative %: 72 % (ref 43–77)
Platelets: ADEQUATE 10*3/uL (ref 150–400)
RBC: 4.79 MIL/uL (ref 3.87–5.11)
RDW: 14.3 % (ref 11.5–15.5)
WBC: 26.4 10*3/uL — ABNORMAL HIGH (ref 4.0–10.5)

## 2013-10-22 LAB — COMPREHENSIVE METABOLIC PANEL
ALBUMIN: 3 g/dL — AB (ref 3.5–5.2)
ALK PHOS: 105 U/L (ref 39–117)
ALT: 16 U/L (ref 0–35)
ANION GAP: 11 (ref 5–15)
AST: 22 U/L (ref 0–37)
BUN: 32 mg/dL — ABNORMAL HIGH (ref 6–23)
CHLORIDE: 94 meq/L — AB (ref 96–112)
CO2: 30 mEq/L (ref 19–32)
Calcium: 8.8 mg/dL (ref 8.4–10.5)
Creatinine, Ser: 1 mg/dL (ref 0.50–1.10)
GFR calc Af Amer: 67 mL/min — ABNORMAL LOW (ref >90)
GFR calc non Af Amer: 58 mL/min — ABNORMAL LOW (ref >90)
Glucose, Bld: 127 mg/dL — ABNORMAL HIGH (ref 70–99)
POTASSIUM: 5.2 meq/L (ref 3.7–5.3)
SODIUM: 135 meq/L — AB (ref 137–147)
TOTAL PROTEIN: 7.1 g/dL (ref 6.0–8.3)
Total Bilirubin: 0.5 mg/dL (ref 0.3–1.2)

## 2013-10-22 LAB — URINE MICROSCOPIC-ADD ON

## 2013-10-22 LAB — HEMOGLOBIN A1C
HEMOGLOBIN A1C: 6.1 % — AB (ref <5.7)
Mean Plasma Glucose: 128 mg/dL — ABNORMAL HIGH (ref <117)

## 2013-10-22 LAB — GLUCOSE, CAPILLARY
GLUCOSE-CAPILLARY: 113 mg/dL — AB (ref 70–99)
GLUCOSE-CAPILLARY: 138 mg/dL — AB (ref 70–99)

## 2013-10-22 LAB — LACTIC ACID, PLASMA: Lactic Acid, Venous: 1.6 mmol/L (ref 0.5–2.2)

## 2013-10-22 LAB — CLOSTRIDIUM DIFFICILE BY PCR: Toxigenic C. Difficile by PCR: POSITIVE — AB

## 2013-10-22 LAB — TSH: TSH: 0.35 u[IU]/mL (ref 0.350–4.500)

## 2013-10-22 MED ORDER — PROMETHAZINE HCL 12.5 MG PO TABS
12.5000 mg | ORAL_TABLET | Freq: Four times a day (QID) | ORAL | Status: DC | PRN
  Administered 2013-10-25: 12.5 mg via ORAL
  Filled 2013-10-22: qty 1

## 2013-10-22 MED ORDER — MAGNESIUM CITRATE PO SOLN: 1.0000 | Freq: Once | ORAL | Status: AC | PRN

## 2013-10-22 MED ORDER — ACETAMINOPHEN 650 MG RE SUPP: 650.0000 mg | Freq: Four times a day (QID) | RECTAL | Status: DC | PRN

## 2013-10-22 MED ORDER — TIZANIDINE HCL 4 MG PO TABS: 2.0000 mg | ORAL_TABLET | Freq: Four times a day (QID) | ORAL | Status: DC | PRN

## 2013-10-22 MED ORDER — INSULIN ASPART 100 UNIT/ML ~~LOC~~ SOLN
0.0000 [IU] | Freq: Three times a day (TID) | SUBCUTANEOUS | Status: DC
  Administered 2013-10-23 – 2013-10-24 (×2): 2 [IU] via SUBCUTANEOUS

## 2013-10-22 MED ORDER — OXYCODONE HCL 5 MG PO TABS
7.5000 mg | ORAL_TABLET | Freq: Four times a day (QID) | ORAL | Status: DC | PRN
  Administered 2013-10-23 – 2013-10-25 (×6): 7.5 mg via ORAL
  Filled 2013-10-22 (×6): qty 2

## 2013-10-22 MED ORDER — LEVOTHYROXINE SODIUM 25 MCG PO TABS: 25.0000 ug | ORAL_TABLET | Freq: Every day | ORAL | Status: DC

## 2013-10-22 MED ORDER — RISAQUAD PO CAPS
1.0000 | ORAL_CAPSULE | Freq: Two times a day (BID) | ORAL | Status: AC
  Administered 2013-10-22 – 2013-10-23 (×4): 1 via ORAL
  Filled 2013-10-22 (×4): qty 1

## 2013-10-22 MED ORDER — ALENDRONATE SODIUM 70 MG PO TABS: 70.0000 mg | ORAL_TABLET | ORAL | Status: DC

## 2013-10-22 MED ORDER — EZETIMIBE 10 MG PO TABS
10.0000 mg | ORAL_TABLET | Freq: Every day | ORAL | Status: DC
  Administered 2013-10-22 – 2013-10-25 (×4): 10 mg via ORAL
  Filled 2013-10-22 (×4): qty 1

## 2013-10-22 MED ORDER — ONDANSETRON HCL 4 MG/2ML IJ SOLN: 4.0000 mg | Freq: Four times a day (QID) | INTRAMUSCULAR | Status: DC | PRN

## 2013-10-22 MED ORDER — IPRATROPIUM-ALBUTEROL 0.5-2.5 (3) MG/3ML IN SOLN
3.0000 mL | Freq: Three times a day (TID) | RESPIRATORY_TRACT | Status: DC
  Administered 2013-10-22 – 2013-10-24 (×5): 3 mL via RESPIRATORY_TRACT
  Filled 2013-10-22 (×5): qty 3

## 2013-10-22 MED ORDER — VANCOMYCIN 50 MG/ML ORAL SOLUTION
ORAL | Status: AC
  Filled 2013-10-22: qty 10

## 2013-10-22 MED ORDER — BUPROPION HCL ER (XL) 300 MG PO TB24
300.0000 mg | ORAL_TABLET | Freq: Every day | ORAL | Status: DC
  Administered 2013-10-22 – 2013-10-24 (×3): 300 mg via ORAL
  Filled 2013-10-22 (×7): qty 1

## 2013-10-22 MED ORDER — INSULIN ASPART 100 UNIT/ML ~~LOC~~ SOLN
0.0000 [IU] | Freq: Every day | SUBCUTANEOUS | Status: DC
  Administered 2013-10-24: 2 [IU] via SUBCUTANEOUS

## 2013-10-22 MED ORDER — LORATADINE 10 MG PO TABS
10.0000 mg | ORAL_TABLET | Freq: Every day | ORAL | Status: DC
  Administered 2013-10-22 – 2013-10-25 (×4): 10 mg via ORAL
  Filled 2013-10-22 (×4): qty 1

## 2013-10-22 MED ORDER — BUPROPION HCL ER (XL) 150 MG PO TB24
150.0000 mg | ORAL_TABLET | Freq: Every day | ORAL | Status: DC
  Administered 2013-10-22 – 2013-10-24 (×3): 150 mg via ORAL
  Filled 2013-10-22 (×7): qty 1

## 2013-10-22 MED ORDER — PANTOPRAZOLE SODIUM 40 MG PO TBEC
40.0000 mg | DELAYED_RELEASE_TABLET | Freq: Every day | ORAL | Status: DC
  Administered 2013-10-22 – 2013-10-25 (×4): 40 mg via ORAL
  Filled 2013-10-22 (×4): qty 1

## 2013-10-22 MED ORDER — ACETAMINOPHEN 325 MG PO TABS
650.0000 mg | ORAL_TABLET | Freq: Four times a day (QID) | ORAL | Status: DC | PRN
  Administered 2013-10-23: 650 mg via ORAL
  Filled 2013-10-22: qty 2

## 2013-10-22 MED ORDER — VITAMIN B-6 50 MG PO TABS
100.0000 mg | ORAL_TABLET | Freq: Two times a day (BID) | ORAL | Status: DC
  Administered 2013-10-22 – 2013-10-25 (×7): 100 mg via ORAL
  Filled 2013-10-22 (×7): qty 2

## 2013-10-22 MED ORDER — DEXTROSE 5 % IV SOLN
1.0000 g | Freq: Once | INTRAVENOUS | Status: AC
Start: 2013-10-22 — End: 2013-10-22
  Administered 2013-10-22: 1 g via INTRAVENOUS
  Filled 2013-10-22: qty 10

## 2013-10-22 MED ORDER — ALUM & MAG HYDROXIDE-SIMETH 200-200-20 MG/5ML PO SUSP: 30.0000 mL | ORAL | Status: DC | PRN

## 2013-10-22 MED ORDER — SODIUM CHLORIDE 0.9 % IV BOLUS (SEPSIS)
1000.0000 mL | Freq: Once | INTRAVENOUS | Status: AC
  Administered 2013-10-22: 1000 mL via INTRAVENOUS

## 2013-10-22 MED ORDER — ALBUTEROL SULFATE (2.5 MG/3ML) 0.083% IN NEBU: 3.0000 mL | INHALATION_SOLUTION | RESPIRATORY_TRACT | Status: DC | PRN

## 2013-10-22 MED ORDER — DEXTROSE 5 % IV SOLN
1.0000 g | INTRAVENOUS | Status: DC
  Administered 2013-10-23 – 2013-10-25 (×3): 1 g via INTRAVENOUS
  Filled 2013-10-22 (×7): qty 10

## 2013-10-22 MED ORDER — MONTELUKAST SODIUM 10 MG PO TABS
10.0000 mg | ORAL_TABLET | Freq: Every day | ORAL | Status: DC
  Administered 2013-10-22 – 2013-10-25 (×4): 10 mg via ORAL
  Filled 2013-10-22 (×4): qty 1

## 2013-10-22 MED ORDER — ONDANSETRON HCL 4 MG PO TABS: 4.0000 mg | ORAL_TABLET | Freq: Four times a day (QID) | ORAL | Status: DC | PRN

## 2013-10-22 MED ORDER — ASPIRIN EC 81 MG PO TBEC
81.0000 mg | DELAYED_RELEASE_TABLET | Freq: Every day | ORAL | Status: DC
  Administered 2013-10-22 – 2013-10-25 (×4): 81 mg via ORAL
  Filled 2013-10-22 (×4): qty 1

## 2013-10-22 MED ORDER — IPRATROPIUM-ALBUTEROL 0.5-2.5 (3) MG/3ML IN SOLN
3.0000 mL | Freq: Four times a day (QID) | RESPIRATORY_TRACT | Status: DC
  Administered 2013-10-22: 3 mL via RESPIRATORY_TRACT
  Filled 2013-10-22: qty 3

## 2013-10-22 MED ORDER — DM-GUAIFENESIN ER 30-600 MG PO TB12
1.0000 | ORAL_TABLET | Freq: Two times a day (BID) | ORAL | Status: DC
  Administered 2013-10-22 – 2013-10-25 (×7): 1 via ORAL
  Filled 2013-10-22 (×7): qty 1

## 2013-10-22 MED ORDER — SODIUM CHLORIDE 0.9 % IJ SOLN
3.0000 mL | Freq: Two times a day (BID) | INTRAMUSCULAR | Status: DC
  Administered 2013-10-22 – 2013-10-25 (×6): 3 mL via INTRAVENOUS

## 2013-10-22 MED ORDER — SODIUM CHLORIDE 0.9 % IV SOLN
INTRAVENOUS | Status: AC
  Administered 2013-10-22: 14:00:00 via INTRAVENOUS

## 2013-10-22 MED ORDER — VANCOMYCIN 50 MG/ML ORAL SOLUTION
125.0000 mg | Freq: Four times a day (QID) | ORAL | Status: DC
  Administered 2013-10-22 – 2013-10-25 (×10): 125 mg via ORAL
  Filled 2013-10-22 (×23): qty 2.5

## 2013-10-22 MED ORDER — LORAZEPAM 0.5 MG PO TABS
0.2500 mg | ORAL_TABLET | Freq: Four times a day (QID) | ORAL | Status: DC | PRN
  Administered 2013-10-23: 0.25 mg via ORAL
  Filled 2013-10-22: qty 1

## 2013-10-22 MED ORDER — GABAPENTIN 300 MG PO CAPS
300.0000 mg | ORAL_CAPSULE | Freq: Three times a day (TID) | ORAL | Status: DC
  Administered 2013-10-22 – 2013-10-25 (×9): 300 mg via ORAL
  Filled 2013-10-22 (×9): qty 1

## 2013-10-22 MED ORDER — BACLOFEN 10 MG PO TABS
10.0000 mg | ORAL_TABLET | Freq: Two times a day (BID) | ORAL | Status: DC
  Administered 2013-10-22 – 2013-10-25 (×7): 10 mg via ORAL
  Filled 2013-10-22 (×7): qty 1

## 2013-10-22 MED ORDER — LEVOTHYROXINE SODIUM 50 MCG PO TABS
225.0000 ug | ORAL_TABLET | Freq: Every day | ORAL | Status: DC
  Administered 2013-10-22 – 2013-10-25 (×4): 225 ug via ORAL
  Filled 2013-10-22 (×4): qty 5

## 2013-10-22 MED ORDER — POLYETHYLENE GLYCOL 3350 17 G PO PACK
17.0000 g | PACK | Freq: Every day | ORAL | Status: DC
  Administered 2013-10-22: 17 g via ORAL
  Filled 2013-10-22: qty 1

## 2013-10-22 MED ORDER — PSEUDOEPHEDRINE-GUAIFENESIN ER 60-600 MG PO TB12: 1.0000 | ORAL_TABLET | Freq: Two times a day (BID) | ORAL | Status: DC

## 2013-10-22 MED ORDER — ALBUTEROL SULFATE (2.5 MG/3ML) 0.083% IN NEBU: 3.0000 mL | INHALATION_SOLUTION | Freq: Three times a day (TID) | RESPIRATORY_TRACT | Status: DC

## 2013-10-22 MED ORDER — POLYSACCHARIDE IRON COMPLEX 150 MG PO CAPS
150.0000 mg | ORAL_CAPSULE | Freq: Every day | ORAL | Status: DC
  Administered 2013-10-22 – 2013-10-25 (×4): 150 mg via ORAL
  Filled 2013-10-22 (×4): qty 1

## 2013-10-22 MED ORDER — ADULT MULTIVITAMIN W/MINERALS CH
1.0000 | ORAL_TABLET | Freq: Every day | ORAL | Status: DC
  Administered 2013-10-22 – 2013-10-25 (×4): 1 via ORAL
  Filled 2013-10-22 (×4): qty 1

## 2013-10-22 MED ORDER — DICLOFENAC SODIUM 50 MG PO TBEC
50.0000 mg | DELAYED_RELEASE_TABLET | Freq: Two times a day (BID) | ORAL | Status: DC
  Administered 2013-10-22 – 2013-10-25 (×7): 50 mg via ORAL
  Filled 2013-10-22 (×13): qty 1

## 2013-10-22 MED ORDER — CLONAZEPAM 0.5 MG PO TABS
1.0000 mg | ORAL_TABLET | Freq: Three times a day (TID) | ORAL | Status: DC
  Administered 2013-10-22 – 2013-10-25 (×9): 1 mg via ORAL
  Filled 2013-10-22 (×9): qty 2

## 2013-10-22 MED ORDER — ENOXAPARIN SODIUM 80 MG/0.8ML ~~LOC~~ SOLN
80.0000 mg | SUBCUTANEOUS | Status: DC
  Administered 2013-10-22 – 2013-10-23 (×2): 80 mg via SUBCUTANEOUS
  Filled 2013-10-22 (×3): qty 0.8

## 2013-10-22 NOTE — H&P (Signed)
[facility] History and Physical  [name] MVE:[id number] DOB: [birth year] DOA: 10/22/2013  Referring physician:  PCP: Rosita Fire, MD   Chief Complaint: AMS  HPI: [name] is a 68 y.o. female with a past medical history of paraplegic spinal paralysis, hypertension, bipolar disorder, thyroid disease, diabetes and recently pneumonia presents to the emergency department from a nursing facility where she resides with the chief complaint of altered mental status. Initial evaluation in the emergency department revealed leukocytosis with a white count of 26.4, hypotension and tachycardia and temperature of 99.7 a likely related to a urinalysis consistent with UTI.  Information is obtained from the patient and the caregiver who is at the bedside. She reports she awakened this morning and felt confused. She denied any pain or discomfort. Caregiver confirms patient recently completed antibiotics for pneumonia diagnosed earlier this month. She denies any coughing shortness of breath or chest pain. There is been no report of fever chills. Patient has chronic indwelling Foley catheter that was changed July 5 of this year. There is been no abdominal pain nausea vomiting. No complaints of  headache or decreased appetite.  Workup in the emergency room reveals a sodium level of 135 serum glucose 127 albumin 3.0 white count 26.4. Urinalysis with many bacteria 7-10 RBCs too numerous to count WBCs moderate leukocytes. Her blood pressure was slightly soft at 90/50 and heart rate 101. CT of the head was without acute abnormality and chest x-ray with new right basilar atelectasis. She was provided with 3 L of normal saline intravenously and 1 g of Rocephin while in the emergency department. At the time of my exam she is much more alert and oriented to person and place. She is non-toxic appearing  Review of Systems:  10 point review of systems completed and all systems are negative except as indicated  in the history of present illness   Past Medical History  Diagnosis Date  . Paraplegic spinal paralysis   . Hypertension   . Bipolar 1 disorder   . Thyroid disease   . Diabetes mellitus without complication    Past Surgical History  Procedure Laterality Date  . Back surgery    . Abdominal hysterectomy     Social History:  reports that she has quit smoking. She does not have any smokeless tobacco history on file. She reports that she does not drink alcohol or use illicit drugs. She is a paraplegic and lives at [facility] facility. She does not smoke she does not drink. Allergies  Allergen Reactions  . Codeine   . Propranolol Hcl     No family history on file.   Prior to Admission medications   Medication Sig Start Date End Date Taking? Authorizing Provider  acetaminophen (TYLENOL) 500 MG tablet Take 500 mg by mouth every 6 (six) hours as needed for pain.   Yes Historical Provider, MD  acidophilus (RISAQUAD) CAPS capsule Take 1 capsule by mouth 2 (two) times daily. For 20 days starting on 10/03/13   Yes Historical Provider, MD  albuterol (PROVENTIL HFA) 108 (90 BASE) MCG/ACT inhaler Inhale 2 puffs into the lungs 3 (three) times daily.   Yes Historical Provider, MD  alendronate (FOSAMAX) 70 MG tablet Take 70 mg by mouth once a week. Take with a full glass of water on an empty stomach.   Yes Historical Provider, MD  alum & mag hydroxide-simeth (MAALOX/MYLANTA) 200-200-20 MG/5ML suspension Take 30 mLs by mouth every 4 (four) hours as needed for indigestion or heartburn.  Yes Historical Provider, MD  aspirin EC 81 MG tablet Take 81 mg by mouth daily.   Yes Historical Provider, MD  baclofen (LIORESAL) 10 MG tablet Take 10 mg by mouth 2 (two) times daily.    Yes Historical Provider, MD  buPROPion (WELLBUTRIN XL) 150 MG 24 hr tablet Take 150 mg by mouth daily.   Yes Historical Provider, MD  buPROPion (WELLBUTRIN XL) 300 MG 24 hr tablet Take 300 mg by mouth daily.   Yes Historical  Provider, MD  calcium citrate-vitamin D 500-400 MG-UNIT Chew 1 tablet by mouth 2 (two) times daily.   Yes Historical Provider, MD  carvedilol (COREG) 12.5 MG tablet Take 12.5 mg by mouth 2 (two) times daily with a meal.   Yes Historical Provider, MD  cetirizine (ZYRTEC) 10 MG tablet Take 10 mg by mouth daily.   Yes Historical Provider, MD  clonazePAM (KLONOPIN) 1 MG tablet Take 1 mg by mouth 3 (three) times daily.   Yes Historical Provider, MD  diclofenac (VOLTAREN) 50 MG EC tablet Take 50 mg by mouth 2 (two) times daily.   Yes Historical Provider, MD  ezetimibe (ZETIA) 10 MG tablet Take 10 mg by mouth daily.   Yes Historical Provider, MD  fentaNYL (DURAGESIC - DOSED MCG/HR) 25 MCG/HR patch Place 25 mcg onto the skin every 3 (three) days. In addition to the 100 mcg patch   Yes Historical Provider, MD  furosemide (LASIX) 40 MG tablet Take 40 mg by mouth daily.   Yes Historical Provider, MD  gabapentin (NEURONTIN) 300 MG capsule Take 300 mg by mouth 3 (three) times daily.   Yes Historical Provider, MD  Glucosamine-Chondroitin (GLUCOSAMINE CHONDR COMPLEX PO) Take 1 tablet by mouth 3 (three) times daily.   Yes Historical Provider, MD  ipratropium-albuterol (DUONEB) 0.5-2.5 (3) MG/3ML SOLN Take 3 mLs by nebulization every 6 (six) hours.   Yes Historical Provider, MD  iron polysaccharides (POLY-IRON 150) 150 MG capsule Take 150 mg by mouth daily.   Yes Historical Provider, MD  levothyroxine (SYNTHROID, LEVOTHROID) 200 MCG tablet Take 200 mcg by mouth daily. Takes with Synthroid (Levothyroxine) 71mcg for a total of 21mcg daily at 6:00am   Yes Historical Provider, MD  levothyroxine (SYNTHROID, LEVOTHROID) 25 MCG tablet Take 25 mcg by mouth daily. Takes with Synthroid (Levothyroxine) 234mcg for a total of 233mcg daily at 6:00am   Yes Historical Provider, MD  lisinopril (PRINIVIL,ZESTRIL) 5 MG tablet Take 5 mg by mouth daily.   Yes Historical Provider, MD  LORazepam (ATIVAN) 0.5 MG tablet Take 0.25 mg by mouth  every 6 (six) hours as needed for anxiety.   Yes Historical Provider, MD  montelukast (SINGULAIR) 10 MG tablet Take 10 mg by mouth daily.   Yes Historical Provider, MD  Multiple Vitamin (MULTIVITAMIN WITH MINERALS) TABS Take 1 tablet by mouth daily. DAILY-VITE   Yes Historical Provider, MD  Omega-3 Fatty Acids (SEA-OMEGA PO) Take 1 capsule by mouth 2 (two) times daily.   Yes Historical Provider, MD  omeprazole (PRILOSEC) 20 MG capsule Take 20 mg by mouth daily.   Yes Historical Provider, MD  OxyCODONE HCl 7.5 MG TABA Take 1 tablet by mouth every 6 (six) hours as needed (for pain).   Yes Historical Provider, MD  polyethylene glycol powder (GLYCOLAX/MIRALAX) powder Take 17 g by mouth daily.   Yes Historical Provider, MD  potassium chloride (K-DUR) 10 MEQ tablet Take 10 mEq by mouth daily.   Yes Historical Provider, MD  promethazine (PHENERGAN) 12.5 MG tablet Take 12.5  mg by mouth every 6 (six) hours as needed for nausea or vomiting.   Yes Historical Provider, MD  pseudoephedrine-guaifenesin (MUCINEX D) 60-600 MG per tablet Take 1 tablet by mouth every 12 (twelve) hours as needed for congestion.   Yes Historical Provider, MD  pyridOXINE (VITAMIN B-6) 100 MG tablet Take 100 mg by mouth 2 (two) times daily.   Yes Historical Provider, MD  rizatriptan (MAXALT) 10 MG tablet Take 10 mg by mouth every 8 (eight) hours as needed for migraine. May repeat in 2 hours if needed   Yes Historical Provider, MD  tiZANidine (ZANAFLEX) 2 MG tablet Take 2 mg by mouth every 6 (six) hours as needed for muscle spasms.   Yes Historical Provider, MD  fentaNYL (DURAGESIC - DOSED MCG/HR) 100 MCG/HR Place 1 patch onto the skin every 3 (three) days.    Historical Provider, MD   Physical Exam: Filed Vitals:   10/22/13 0730 10/22/13 0732 10/22/13 0922 10/22/13 1022  BP: 99/70 99/70 106/81 90/51  Pulse:  101 95 99  Temp:   98.5 F (36.9 C)   TempSrc:   Oral   Resp: 15 18 18 18   Weight:      SpO2:  100% 100% 94%    Wt  Readings from Last 3 Encounters:  10/22/13 156.945 kg (346 lb)  10/11/12 156.945 kg (346 lb)  05/16/07 111.585 kg (246 lb)    General:  Appears calm and comfortable obese somewhat pale Eyes: PERRL, normal lids, irises & conjunctiva ENT: Weakness membranes of her mouth are somewhat pale very dry, ears are clear nose without drainage Neck: no LAD, masses or thyromegaly Cardiovascular: Tachycardic but regular I hear no murmur no gallop no rub trace pitting lower extremity edema Respiratory: Normal effort Abdomen: Obese soft positive bowel sounds throughout nontender to palpation no mass organomegaly noted Skin: no rash or induration seen on limited exam, abrasion top of all right foot Musculoskeletal: Joints without swelling/erythema full range of motion to upper extremities Psychiatric: grossly normal mood and affect, speech fluent and appropriate Neurologic: Speech is clear facial symmetry cranial nerves II through XII grossly intact she is oriented to self and place. Has trouble with detail memory           Labs on Admission:  Basic Metabolic Panel:  Recent Labs Lab 10/22/13 0752  NA 135*  K 5.2  CL 94*  CO2 30  GLUCOSE 127*  BUN 32*  CREATININE 1.00  CALCIUM 8.8   Liver Function Tests:  Recent Labs Lab 10/22/13 0752  AST 22  ALT 16  ALKPHOS 105  BILITOT 0.5  PROT 7.1  ALBUMIN 3.0*   No results found for this basename: LIPASE, AMYLASE,  in the last 168 hours No results found for this basename: AMMONIA,  in the last 168 hours CBC:  Recent Labs Lab 10/22/13 0752  WBC 26.4*  NEUTROABS 19.1*  HGB 13.4  HCT 41.2  MCV 86.0  PLT PLATELET CLUMPS NOTED ON SMEAR, COUNT APPEARS ADEQUATE   Cardiac Enzymes: No results found for this basename: CKTOTAL, CKMB, CKMBINDEX, TROPONINI,  in the last 168 hours  BNP (last 3 results) No results found for this basename: PROBNP,  in the last 8760 hours CBG: No results found for this basename: GLUCAP,  in the last 168  hours  Radiological Exams on Admission: Ct Head Wo Contrast  10/22/2013   CLINICAL DATA:  Altered mental status.  EXAM: CT HEAD WITHOUT CONTRAST  TECHNIQUE: Contiguous axial images were obtained from the  base of the skull through the vertex without intravenous contrast.  COMPARISON:  Head CT 10/19/2012.  FINDINGS: No acute intracranial abnormalities. Specifically, no evidence of acute intracranial hemorrhage, no definite findings of acute/subacute cerebral ischemia, no mass, mass effect, hydrocephalus or abnormal intra or extra-axial fluid collections. Visualized paranasal sinuses and mastoids are well pneumatized. No acute displaced skull fractures are identified.  IMPRESSION: *No acute intracranial abnormalities. *The appearance of the brain is normal.   Electronically Signed   By: Vinnie Langton M.D.   On: 10/22/2013 08:11   Dg Chest Portable 1 View  10/22/2013   CLINICAL DATA:  Altered mental status.  EXAM: PORTABLE CHEST - 1 VIEW  COMPARISON:  10/11/2012.  FINDINGS: Cardiopericardial silhouette appears within normal limits. Monitoring leads project over the chest. The LEFT lung is clear. Mediastinal contours are within normal limits. There is new density at the RIGHT lung base which appears linear, most compatible with subsegmental atelectasis, probably within the RIGHT lower lobe.  IMPRESSION: New RIGHT basilar linear opacity most compatible subsegmental atelectasis.   Electronically Signed   By: Dereck Ligas M.D.   On: 10/22/2013 07:39    EKG: Independently reviewed. SR  Assessment/Plan Principal Problem:   Sepsis: Likely secondary to acute pyelonephritis. Will admit to telemetry. Will continue with supportive therapy specifically  IV fluids. Will hold home anti-hypertensive medications. VS every 4 hours x2.  Will continue Rocephin that was initiated in the emergency department. Will get urine cultures.  Asymmetric improved at the time of my exam.  Active Problems:    Pyelonephritis:  Patient has chronic indwelling Foley catheter with monthly changes. Last tubing changed July 5 with bag change on July 22. Will continue antibiotics as above. Will monitor intake and output. Await urine culture. If no improvement will consider renal ultrasound.    Leukocytosis: Related to above. Therapies as above. Will monitor    Hyponatremia: Mild.  Patient appears clinically dry. Will provide IV fluids.    Encephalopathy acute: Patient is already improved after her IV fluids and antibiotics was started in emergency department. T. of her head without acute changes. Will continue to monitor    Paraplegic spinal paralysis: Stable at baseline. Will continue her home medication. Consider physical therapy     Hypertension: Home  medications include lisinopril, Lasix and Coreg. Will hold these for now given her mild hypotension. Will monitor closely and resume as indicated.    Bipolar 1 disorder: Appears to be stable at baseline. Will continue her home medications    Thyroid disease: We'll check a TSH. Continue her home Synthroid    Diabetes mellitus without complication: Appears to be diet controlled. Will continue car modified diet will check a hemoglobin A1c will use sliding scale insulin for optimal control  COPD. Not oxygen dependent. Patient with recent pneumonia diagnosis as well. She completed antibiotics this month. Appears to be at baseline. Chest x-ray does indicate new atelectasis. Continue home inhalers and nebulizers. She appears to be a baseline.      Code Status: full  DVT Prophylaxis: Family Communication: caregiver at bedside Disposition Plan: back to facility hopefully 48 hours  Time spent: 65 minutes  [facility] Pager [phone]  **Disclaimer: This note may have been dictated with voice recognition software. Similar sounding words can inadvertently be transcribed and this note may contain transcription errors which may not have been corrected upon  publication of note.**

## 2013-10-22 NOTE — Progress Notes (Signed)

## 2013-10-22 NOTE — Progress Notes (Signed)
Nutrition Brief Note  RD screened due to low braden score of 11.  Wt Readings from Last 15 Encounters:  10/22/13 346 lb (156.945 kg)  10/11/12 346 lb (156.945 kg)  05/16/07 246 lb (111.585 kg)   Pt admitted with sepsis. No complaints of poor appetite. Noted hx of weight gain over the past several years. Wt has been stable x 1 year.  There is no height on file to calculate BMI. Unable to calculate BMI due to insufficient information.  Current diet order is Heart Healthy/ Carb modified, patient is consuming approximately n/a% of meals at this time. Labs and medications reviewed.   No nutrition interventions warranted at this time. If nutrition issues arise, please consult RD.   Sharyn Brilliant A. Jimmye Norman, RD, LDN Pager: [phone]

## 2013-10-22 NOTE — ED Notes (Signed)
Pt to department from [facility].  Per report, pt was found to be more confused than normal.  Per EMS, 2 Fentanyl patches were removed from leg.  Foley in place, urine dark and sediment.

## 2013-10-22 NOTE — Progress Notes (Signed)
ANTIBIOTIC CONSULT NOTE - INITIAL  Pharmacy Consult for Rocephin Indication: UTI  Allergies  Allergen Reactions  . Codeine   . Propranolol Hcl     Patient Measurements: Weight: 346 lb (156.945 kg)  Vital Signs: Temp: 98.5 F (36.9 C) (07/28 2423) Temp src: Oral (07/28 0922) BP: 92/58 mmHg (07/28 1155) Pulse Rate: 95 (07/28 1155) Intake/Output from previous day:   Intake/Output from this shift:    Labs:  Recent Labs  10/22/13 0752  WBC 26.4*  HGB 13.4  PLT PLATELET CLUMPS NOTED ON SMEAR, COUNT APPEARS ADEQUATE  CREATININE 1.00   CrCl is unknown because there is no height on file for the current visit. No results found for this basename: VANCOTROUGH, VANCOPEAK, VANCORANDOM, GENTTROUGH, GENTPEAK, GENTRANDOM, TOBRATROUGH, TOBRAPEAK, TOBRARND, AMIKACINPEAK, AMIKACINTROU, AMIKACIN,  in the last 72 hours   Microbiology: No results found for this or any previous visit (from the past 720 hour(s)).  Medical History: Past Medical History  Diagnosis Date  . Paraplegic spinal paralysis   . Hypertension   . Bipolar 1 disorder   . Thyroid disease   . Diabetes mellitus without complication   . Obesity   . Pyelonephritis   . COPD with asthma   . GERD (gastroesophageal reflux disease)     Medications:  Prescriptions prior to admission  Medication Sig Dispense Refill  . acetaminophen (TYLENOL) 500 MG tablet Take 500 mg by mouth every 6 (six) hours as needed for pain.      Marland Kitchen acidophilus (RISAQUAD) CAPS capsule Take 1 capsule by mouth 2 (two) times daily. For 20 days starting on 10/03/13      . albuterol (PROVENTIL HFA) 108 (90 BASE) MCG/ACT inhaler Inhale 2 puffs into the lungs 3 (three) times daily.      Marland Kitchen alendronate (FOSAMAX) 70 MG tablet Take 70 mg by mouth once a week. Take with a full glass of water on an empty stomach.      Marland Kitchen alum & mag hydroxide-simeth (MAALOX/MYLANTA) 200-200-20 MG/5ML suspension Take 30 mLs by mouth every 4 (four) hours as needed for indigestion or  heartburn.      Marland Kitchen aspirin EC 81 MG tablet Take 81 mg by mouth daily.      . baclofen (LIORESAL) 10 MG tablet Take 10 mg by mouth 2 (two) times daily.       Marland Kitchen buPROPion (WELLBUTRIN XL) 150 MG 24 hr tablet Take 150 mg by mouth daily.      Marland Kitchen buPROPion (WELLBUTRIN XL) 300 MG 24 hr tablet Take 300 mg by mouth daily.      . calcium citrate-vitamin D 500-400 MG-UNIT Chew 1 tablet by mouth 2 (two) times daily.      . carvedilol (COREG) 12.5 MG tablet Take 12.5 mg by mouth 2 (two) times daily with a meal.      . cetirizine (ZYRTEC) 10 MG tablet Take 10 mg by mouth daily.      . clonazePAM (KLONOPIN) 1 MG tablet Take 1 mg by mouth 3 (three) times daily.      . diclofenac (VOLTAREN) 50 MG EC tablet Take 50 mg by mouth 2 (two) times daily.      Marland Kitchen ezetimibe (ZETIA) 10 MG tablet Take 10 mg by mouth daily.      . fentaNYL (DURAGESIC - DOSED MCG/HR) 25 MCG/HR patch Place 25 mcg onto the skin every 3 (three) days. In addition to the 100 mcg patch      . furosemide (LASIX) 40 MG tablet Take 40 mg by  mouth daily.      Marland Kitchen gabapentin (NEURONTIN) 300 MG capsule Take 300 mg by mouth 3 (three) times daily.      . Glucosamine-Chondroitin (GLUCOSAMINE CHONDR COMPLEX PO) Take 1 tablet by mouth 3 (three) times daily.      Marland Kitchen ipratropium-albuterol (DUONEB) 0.5-2.5 (3) MG/3ML SOLN Take 3 mLs by nebulization every 6 (six) hours.      . iron polysaccharides (POLY-IRON 150) 150 MG capsule Take 150 mg by mouth daily.      Marland Kitchen levothyroxine (SYNTHROID, LEVOTHROID) 200 MCG tablet Take 200 mcg by mouth daily. Takes with Synthroid (Levothyroxine) 63mcg for a total of 232mcg daily at 6:00am      . levothyroxine (SYNTHROID, LEVOTHROID) 25 MCG tablet Take 25 mcg by mouth daily. Takes with Synthroid (Levothyroxine) 235mcg for a total of 242mcg daily at 6:00am      . lisinopril (PRINIVIL,ZESTRIL) 5 MG tablet Take 5 mg by mouth daily.      Marland Kitchen LORazepam (ATIVAN) 0.5 MG tablet Take 0.25 mg by mouth every 6 (six) hours as needed for anxiety.       . montelukast (SINGULAIR) 10 MG tablet Take 10 mg by mouth daily.      . Multiple Vitamin (MULTIVITAMIN WITH MINERALS) TABS Take 1 tablet by mouth daily. DAILY-VITE      . Omega-3 Fatty Acids (SEA-OMEGA PO) Take 1 capsule by mouth 2 (two) times daily.      Marland Kitchen omeprazole (PRILOSEC) 20 MG capsule Take 20 mg by mouth daily.      . OxyCODONE HCl 7.5 MG TABA Take 1 tablet by mouth every 6 (six) hours as needed (for pain).      . polyethylene glycol powder (GLYCOLAX/MIRALAX) powder Take 17 g by mouth daily.      . potassium chloride (K-DUR) 10 MEQ tablet Take 10 mEq by mouth daily.      . promethazine (PHENERGAN) 12.5 MG tablet Take 12.5 mg by mouth every 6 (six) hours as needed for nausea or vomiting.      . pseudoephedrine-guaifenesin (MUCINEX D) 60-600 MG per tablet Take 1 tablet by mouth every 12 (twelve) hours as needed for congestion.      Marland Kitchen pyridOXINE (VITAMIN B-6) 100 MG tablet Take 100 mg by mouth 2 (two) times daily.      . rizatriptan (MAXALT) 10 MG tablet Take 10 mg by mouth every 8 (eight) hours as needed for migraine. May repeat in 2 hours if needed      . tiZANidine (ZANAFLEX) 2 MG tablet Take 2 mg by mouth every 6 (six) hours as needed for muscle spasms.      . fentaNYL (DURAGESIC - DOSED MCG/HR) 100 MCG/HR Place 1 patch onto the skin every 3 (three) days.       Assessment: 68yo morbidly obese paraplegic female admitted with AMS.  Urinalysis consistent with UTI.  Urine cx pending.  Pt was give Rocephin 1gm IV on admission.  Goal of Therapy:  Eradicate infection.  Plan:  Rocephin 1gm IV q24hrs Deescalate antibiotics to PO choice when appropriate Monitor labs, progress, and cultures  Hart Robinsons A 10/22/2013,12:09 PM

## 2013-10-22 NOTE — Progress Notes (Signed)
Received call from lab. POSITIVE for C-DIFF. Notified MD. Placed on oral vancomycin.

## 2013-10-22 NOTE — ED Provider Notes (Signed)
CSN: [id number]     Arrival date & time 10/22/13  1607 History  This chart was scribed for Maudry Diego, MD by Ludger Nutting, ED Scribe. This patient was seen in room APA14/APA14 and the patient's care was started 7:24 AM.    Chief Complaint  Patient presents with  . Altered Mental Status    Patient is a 68 y.o. female presenting with altered mental status. The history is provided by the patient, the nursing home and medical records. No language interpreter was used.  Altered Mental Status Presenting symptoms: confusion and lethargy   Severity:  Moderate Most recent episode:  Today Episode history:  Single Timing:  Constant Progression:  Unchanged Chronicity:  New Context: nursing home resident and recent infection   Associated symptoms: no abdominal pain, no hallucinations, no headaches, no rash and no seizures     HPI Comments: [name] is a 68 y.o. female who presents to the Emergency Department complaining of AMS that began today. Patient presents from [facility] via EMS for confusion. Patient's nurse from [facility] states patient was also drooling and repeating words/sentences prior to arrival. Per nursing note, patient was diagnosed with right lower lobe pneumonia in early July and finished taking Levaquin on 10/12/13. Patient has a history of paraplegic spinal paralysis. Patient complains of lower back pain.     Past Medical History  Diagnosis Date  . Paraplegic spinal paralysis   . Hypertension   . Bipolar 1 disorder   . Thyroid disease   . Diabetes mellitus without complication    Past Surgical History  Procedure Laterality Date  . Back surgery    . Abdominal hysterectomy     No family history on file. History  Substance Use Topics  . Smoking status: Former Research scientist (life sciences)  . Smokeless tobacco: Not on file  . Alcohol Use: No   OB History   Grav Para Term Preterm Abortions TAB SAB Ect Mult Living                 Review of Systems  Constitutional: Negative for appetite  change and fatigue.  HENT: Negative for congestion, ear discharge and sinus pressure.   Eyes: Negative for discharge.  Respiratory: Negative for cough.   Cardiovascular: Negative for chest pain.  Gastrointestinal: Negative for abdominal pain and diarrhea.  Genitourinary: Negative for frequency and hematuria.  Musculoskeletal: Positive for back pain.  Skin: Negative for rash.  Neurological: Negative for seizures and headaches.  Psychiatric/Behavioral: Positive for confusion. Negative for hallucinations.      Allergies  Codeine and Propranolol hcl  Home Medications   Prior to Admission medications   Medication Sig Start Date End Date Taking? Authorizing Provider  acetaminophen (TYLENOL) 500 MG tablet Take 500 mg by mouth every 6 (six) hours as needed for pain.    Historical Provider, MD  albuterol (PROVENTIL HFA) 108 (90 BASE) MCG/ACT inhaler Inhale 2 puffs into the lungs 3 (three) times daily.    Historical Provider, MD  aspirin EC 81 MG tablet Take 81 mg by mouth daily.    Historical Provider, MD  baclofen (LIORESAL) 10 MG tablet Take 10 mg by mouth 3 (three) times daily.    Historical Provider, MD  buPROPion (WELLBUTRIN XL) 150 MG 24 hr tablet Take 150 mg by mouth daily.    Historical Provider, MD  calcium citrate-vitamin D 500-400 MG-UNIT Chew 1 tablet by mouth 2 (two) times daily.    Historical Provider, MD  carvedilol (COREG) 12.5 MG tablet Take 12.5  mg by mouth 2 (two) times daily with a meal.    Historical Provider, MD  clonazePAM (KLONOPIN) 1 MG tablet Take 1 mg by mouth 3 (three) times daily.    Historical Provider, MD  collagenase (SANTYL) ointment Apply 1 application topically daily. Apply to bilateral buttocks area(s), apply calcium alginate, cover with dressing    Historical Provider, MD  diclofenac (VOLTAREN) 50 MG EC tablet Take 50 mg by mouth 2 (two) times daily.    Historical Provider, MD  divalproex (DEPAKOTE) 250 MG DR tablet Take 250 mg by mouth daily.    Historical  Provider, MD  fentaNYL (DURAGESIC - DOSED MCG/HR) 100 MCG/HR Place 1 patch onto the skin every 3 (three) days.    Historical Provider, MD  furosemide (LASIX) 40 MG tablet Take 40 mg by mouth daily.    Historical Provider, MD  gabapentin (NEURONTIN) 300 MG capsule Take 300 mg by mouth 3 (three) times daily.    Historical Provider, MD  glipiZIDE (GLUCOTROL XL) 5 MG 24 hr tablet Take 5 mg by mouth daily.    Historical Provider, MD  Glucosamine-Chondroitin (GLUCOSAMINE CHONDR COMPLEX PO) Take 1 tablet by mouth 3 (three) times daily.    Historical Provider, MD  guaiFENesin (ROBITUSSIN) 100 MG/5ML SOLN Take 10 mLs by mouth every 6 (six) hours as needed (for cough/congestion).    Historical Provider, MD  ipratropium-albuterol (DUONEB) 0.5-2.5 (3) MG/3ML SOLN Take 3 mLs by nebulization every 6 (six) hours.    Historical Provider, MD  iron polysaccharides (POLY-IRON 150) 150 MG capsule Take 150 mg by mouth daily.    Historical Provider, MD  levothyroxine (SYNTHROID, LEVOTHROID) 200 MCG tablet Take 200 mcg by mouth daily. Takes with Synthroid (Levothyroxine) 26mcg for a total of 24mcg daily at 6:00am    Historical Provider, MD  levothyroxine (SYNTHROID, LEVOTHROID) 25 MCG tablet Take 25 mcg by mouth daily. Takes with Synthroid (Levothyroxine) 223mcg for a total of 214mcg daily at 6:00am    Historical Provider, MD  lisinopril (PRINIVIL,ZESTRIL) 5 MG tablet Take 5 mg by mouth daily.    Historical Provider, MD  loratadine (CLARITIN) 10 MG tablet Take 10 mg by mouth daily.    Historical Provider, MD  Menthol, Topical Analgesic, (BIOFREEZE) 4 % GEL Apply 1 application topically 2 (two) times daily. Applied to right and left shoulders/arms twice daily on morning and evening shifts for pain    Historical Provider, MD  metFORMIN (GLUCOPHAGE) 500 MG tablet Take 500 mg by mouth daily.    Historical Provider, MD  montelukast (SINGULAIR) 10 MG tablet Take 10 mg by mouth daily.    Historical Provider, MD  Multiple Vitamin  (MULTIVITAMIN WITH MINERALS) TABS Take 1 tablet by mouth daily. DAILY-VITE    Historical Provider, MD  Omega-3 Fatty Acids (SEA-OMEGA PO) Take 1 capsule by mouth 2 (two) times daily.    Historical Provider, MD  omeprazole (PRILOSEC) 20 MG capsule Take 20 mg by mouth daily.    Historical Provider, MD  OxyCODONE HCl 7.5 MG TABA Take 1 tablet by mouth every 6 (six) hours as needed (for pain).    Historical Provider, MD  Polyethyl Glycol-Propyl Glycol (SYSTANE) 0.4-0.3 % SOLN Place 1 drop into both eyes 2 (two) times daily.    Historical Provider, MD  polyethylene glycol powder (GLYCOLAX/MIRALAX) powder Take 17 g by mouth daily.    Historical Provider, MD  potassium chloride (K-DUR) 10 MEQ tablet Take 10 mEq by mouth daily.    Historical Provider, MD  pseudoephedrine-guaifenesin [facility] D)  60-600 MG per tablet Take 1 tablet by mouth every 12 (twelve) hours as needed for congestion.    Historical Provider, MD  pyridOXINE (VITAMIN B-6) 100 MG tablet Take 100 mg by mouth 2 (two) times daily.    Historical Provider, MD  rizatriptan (MAXALT) 5 MG tablet Take 5 mg by mouth every 6 (six) hours as needed (for headache).    Historical Provider, MD  SALINE NASAL MIST NA Place 2 sprays into the nose 3 (three) times daily.    Historical Provider, MD  sertraline (ZOLOFT) 25 MG tablet Take 25 mg by mouth daily.    Historical Provider, MD   BP 94/55  Pulse 100  Temp(Src) 99.7 F (37.6 C) (Oral)  Resp 18  Wt 346 lb (156.945 kg)  SpO2 98% Physical Exam  Nursing note and vitals reviewed. Constitutional: She is oriented to person, place, and time. She appears well-developed.  Mildly lethargic   HENT:  Head: Normocephalic.  Mouth/Throat: Mucous membranes are dry.  Eyes: Conjunctivae and EOM are normal. No scleral icterus.  Neck: Neck supple. No thyromegaly present.  Cardiovascular: Regular rhythm and normal heart sounds.  Tachycardia present.  Exam reveals no gallop and no friction rub.   No murmur  heard. Pulmonary/Chest: Effort normal and breath sounds normal. No stridor. She has no wheezes. She has no rales. She exhibits no tenderness.  Abdominal: Soft. She exhibits no distension. There is no tenderness. There is no rebound.  Musculoskeletal: She exhibits no edema.  Unable to move lower extremities due to paraplegia.   Lymphadenopathy:    She has no cervical adenopathy.  Neurological: She is oriented to person, place, and time. She exhibits normal muscle tone. Coordination normal.  Skin: No rash noted. No erythema.  Psychiatric: She has a normal mood and affect. Her behavior is normal.    ED Course  Procedures (including critical care time)  DIAGNOSTIC STUDIES: Oxygen Saturation is 98% on 2 L/min, normal by my interpretation.    COORDINATION OF CARE: 7:34 AM Discussed treatment plan with pt at bedside and pt agreed to plan.  8:58 AM Patient updated on lab results. She understands plans to be admitted.    Labs Review Labs Reviewed  URINALYSIS, ROUTINE W REFLEX MICROSCOPIC - Abnormal; Notable for the following:    APPearance CLOUDY (*)    Hgb urine dipstick TRACE (*)    Bilirubin Urine SMALL (*)    Protein, ur TRACE (*)    Leukocytes, UA MODERATE (*)    All other components within normal limits  COMPREHENSIVE METABOLIC PANEL - Abnormal; Notable for the following:    Sodium 135 (*)    Chloride 94 (*)    Glucose, Bld 127 (*)    BUN 32 (*)    Albumin 3.0 (*)    GFR calc non Af Amer 58 (*)    GFR calc Af Amer 67 (*)    All other components within normal limits  CBC WITH DIFFERENTIAL - Abnormal; Notable for the following:    WBC 26.4 (*)    Neutro Abs 19.1 (*)    Lymphs Abs 5.2 (*)    Monocytes Absolute 2.0 (*)    All other components within normal limits  URINE MICROSCOPIC-ADD ON - Abnormal; Notable for the following:    Bacteria, UA MANY (*)    Crystals CA OXALATE CRYSTALS (*)    All other components within normal limits  LACTIC ACID, PLASMA    Imaging  Review No results found.   EKG Interpretation  Date/Time:  Tuesday October 22 2013 07:42:21 EDT Ventricular Rate:  99 PR Interval:  163 QRS Duration: 92 QT Interval:  339 QTC Calculation: 435 R Axis:   31 Text Interpretation:  Sinus rhythm Low voltage, extremity leads Baseline  wander in lead(s) V2 Confirmed by Demarrion Meiklejohn  MD, Tenecia Ignasiak ([id number]) on 10/22/2013  9:24:58 AM     CRITICAL CARE Performed by: Lilas Diefendorf L Total critical care time: 40 Critical care time was exclusive of separately billable procedures and treating other patients. Critical care was necessary to treat or prevent imminent or life-threatening deterioration. Critical care was time spent personally by me on the following activities: development of treatment plan with patient and/or surrogate as well as nursing, discussions with consultants, evaluation of patient's response to treatment, examination of patient, obtaining history from patient or surrogate, ordering and performing treatments and interventions, ordering and review of laboratory studies, ordering and review of radiographic studies, pulse oximetry and re-evaluation of patient's condition.   MDM   Final diagnoses:  None    The chart was scribed for me under my direct supervision.  I personally performed the history, physical, and medical decision making and all procedures in the evaluation of this patient.Maudry Diego, MD 10/22/13 [id number]

## 2013-10-22 NOTE — ED Notes (Signed)
MD at bedside. 

## 2013-10-22 NOTE — H&P (Signed)
The patient was seen and examined. Her medical record, laboratory studies, and vital signs were reviewed. She was discussed with nurse practitioner, Ms. Renard Hamper. Agree with her assessment and plan. The patient is a 68 year old woman with a history of paraplegic spinal paralysis, hypertension, and hypothyroidism, who presented from [facility] skilled nursing facility when the nursing staff found her to have altered mental status. The CT scan of her head revealed no acute intracranial abnormalities. Her white blood cell count was elevated at 26.4. Her urinalysis revealed too numerous to count WBCs and many bacteria. This afternoon, she is totally alert and oriented. She just ate 100% of her lunch. She has been admitted for management and treatment of a urinary tract infection, consistent with acute pyelonephritis. We'll continue management as outlined by Ms. Black.

## 2013-10-22 NOTE — ED Notes (Signed)
Sheri from lab called and reported that clean catch urine sample "too solidified and had too much mucous in the sample to be tested." Lab requesting in and out cath. Pt primary RN aware and reported pt has indwelling urinary catheter. Primary RN reported would call lab back and discuss pt care plan.

## 2013-10-22 NOTE — Progress Notes (Signed)
Tech informed me that patient has had multiple loose stools today that smelled foul. Placed on contact [facility]ark) Specimen sent for c-diff.

## 2013-10-23 LAB — GLUCOSE, CAPILLARY
GLUCOSE-CAPILLARY: 82 mg/dL (ref 70–99)
Glucose-Capillary: 62 mg/dL — ABNORMAL LOW (ref 70–99)
Glucose-Capillary: 134 mg/dL — ABNORMAL HIGH (ref 70–99)
Glucose-Capillary: 85 mg/dL (ref 70–99)

## 2013-10-23 LAB — BASIC METABOLIC PANEL
Anion gap: 11 (ref 5–15)
BUN: 24 mg/dL — ABNORMAL HIGH (ref 6–23)
CO2: 25 meq/L (ref 19–32)
CREATININE: 0.56 mg/dL (ref 0.50–1.10)
Calcium: 7.9 mg/dL — ABNORMAL LOW (ref 8.4–10.5)
Chloride: 103 mEq/L (ref 96–112)
GFR calc Af Amer: >90 mL/min (ref >90)
Glucose, Bld: 74 mg/dL (ref 70–99)
Potassium: 4.2 mEq/L (ref 3.7–5.3)
Sodium: 139 mEq/L (ref 137–147)

## 2013-10-23 LAB — CBC
HEMATOCRIT: 33.9 % — AB (ref 36.0–46.0)
Hemoglobin: 10.8 g/dL — ABNORMAL LOW (ref 12.0–15.0)
MCH: 27.8 pg (ref 26.0–34.0)
MCHC: 31.9 g/dL (ref 30.0–36.0)
MCV: 87.4 fL (ref 78.0–100.0)
Platelets: 244 10*3/uL (ref 150–400)
RBC: 3.88 MIL/uL (ref 3.87–5.11)
RDW: 14.3 % (ref 11.5–15.5)
WBC: 12.3 10*3/uL — ABNORMAL HIGH (ref 4.0–10.5)

## 2013-10-23 MED ORDER — FENTANYL 25 MCG/HR TD PT72
25.0000 ug | MEDICATED_PATCH | TRANSDERMAL | Status: DC
  Administered 2013-10-23: 25 ug via TRANSDERMAL
  Filled 2013-10-23: qty 1

## 2013-10-23 MED ORDER — FENTANYL 100 MCG/HR TD PT72
100.0000 ug | MEDICATED_PATCH | TRANSDERMAL | Status: DC
  Administered 2013-10-23: 100 ug via TRANSDERMAL
  Filled 2013-10-23: qty 1

## 2013-10-23 NOTE — Progress Notes (Signed)
Subjective: Patient was admitted yesterday due to altered mental status. She was found to have UTI and C.diffi colitis. Patient is started on treatment and feeling much better. Her mental status has improved.  Objective: Vital signs in last 24 hours: Temp:  [98 F (36.7 C)-98.8 F (37.1 C)] 98 F (36.7 C) (07/29 0712) Pulse Rate:  [79-99] 83 (07/29 0712) Resp:  [9-21] 18 (07/29 0712) BP: (87-106)/(42-81) 99/49 mmHg (07/29 0712) SpO2:  [90 %-100 %] 94 % (07/29 0727) Weight change:  Last BM Date: 10/22/13  Intake/Output from previous day: 07/28 0701 - 07/29 0700 In: 240 [P.O.:240] Out: 2503 [Urine:2500; Stool:3]  PHYSICAL EXAM General appearance: alert and no distress Resp: clear to auscultation bilaterally Cardio: S1, S2 normal GI: soft, non-tender; bowel sounds normal; no masses,  no organomegaly Extremities: paraplegic  Lab Results:  Results for orders placed during the hospital encounter of 10/22/13 (from the past 48 hour(s))  URINALYSIS, ROUTINE W REFLEX MICROSCOPIC     Status: Abnormal   Collection Time    10/22/13  7:17 AM      Result Value Ref Range   Color, Urine YELLOW  YELLOW   APPearance CLOUDY (*) CLEAR   Specific Gravity, Urine 1.015  1.005 - 1.030   pH 7.5  5.0 - 8.0   Glucose, UA NEGATIVE  NEGATIVE mg/dL   Hgb urine dipstick TRACE (*) NEGATIVE   Bilirubin Urine SMALL (*) NEGATIVE   Ketones, ur NEGATIVE  NEGATIVE mg/dL   Protein, ur TRACE (*) NEGATIVE mg/dL   Urobilinogen, UA 0.2  0.0 - 1.0 mg/dL   Nitrite NEGATIVE  NEGATIVE   Leukocytes, UA MODERATE (*) NEGATIVE  URINE MICROSCOPIC-ADD ON     Status: Abnormal   Collection Time    10/22/13  7:17 AM      Result Value Ref Range   WBC, UA TOO NUMEROUS TO COUNT  <3 WBC/hpf   RBC / HPF 7-10  <3 RBC/hpf   Bacteria, UA MANY (*) RARE   Crystals CA OXALATE CRYSTALS (*) NEGATIVE  COMPREHENSIVE METABOLIC PANEL     Status: Abnormal   Collection Time    10/22/13  7:52 AM      Result Value Ref Range   Sodium  135 (*) 137 - 147 mEq/L   Potassium 5.2  3.7 - 5.3 mEq/L   Chloride 94 (*) 96 - 112 mEq/L   CO2 30  19 - 32 mEq/L   Glucose, Bld 127 (*) 70 - 99 mg/dL   BUN 32 (*) 6 - 23 mg/dL   Creatinine, Ser 1.00  0.50 - 1.10 mg/dL   Calcium 8.8  8.4 - 10.5 mg/dL   Total Protein 7.1  6.0 - 8.3 g/dL   Albumin 3.0 (*) 3.5 - 5.2 g/dL   AST 22  0 - 37 U/L   ALT 16  0 - 35 U/L   Alkaline Phosphatase 105  39 - 117 U/L   Total Bilirubin 0.5  0.3 - 1.2 mg/dL   GFR calc non Af Amer 58 (*) >90 mL/min   GFR calc Af Amer 67 (*) >90 mL/min   Comment: (NOTE)     The eGFR has been calculated using the CKD EPI equation.     This calculation has not been validated in all clinical situations.     eGFR's persistently <90 mL/min signify possible Chronic Kidney     Disease.   Anion gap 11  5 - 15  CBC WITH DIFFERENTIAL     Status: Abnormal  Collection Time    10/22/13  7:52 AM      Result Value Ref Range   WBC 26.4 (*) 4.0 - 10.5 K/uL   RBC 4.79  3.87 - 5.11 MIL/uL   Hemoglobin 13.4  12.0 - 15.0 g/dL   HCT 41.2  36.0 - 46.0 %   MCV 86.0  78.0 - 100.0 fL   MCH 28.0  26.0 - 34.0 pg   MCHC 32.5  30.0 - 36.0 g/dL   RDW 14.3  11.5 - 15.5 %   Platelets    150 - 400 K/uL   Value: PLATELET CLUMPS NOTED ON SMEAR, COUNT APPEARS ADEQUATE   Neutrophils Relative % 72  43 - 77 %   Neutro Abs 19.1 (*) 1.7 - 7.7 K/uL   Lymphocytes Relative 20  12 - 46 %   Lymphs Abs 5.2 (*) 0.7 - 4.0 K/uL   Monocytes Relative 7  3 - 12 %   Monocytes Absolute 2.0 (*) 0.1 - 1.0 K/uL   Eosinophils Relative 0  0 - 5 %   Eosinophils Absolute 0.1  0.0 - 0.7 K/uL   Basophils Relative 0  0 - 1 %   Basophils Absolute 0.0  0.0 - 0.1 K/uL  TSH     Status: None   Collection Time    10/22/13  7:52 AM      Result Value Ref Range   TSH 0.350  0.350 - 4.500 uIU/mL   Comment: Performed at [facility] A1C     Status: Abnormal   Collection Time    10/22/13  7:52 AM      Result Value Ref Range   Hemoglobin A1C 6.1 (*) <5.7 %    Comment: (NOTE)                                                                               According to the ADA Clinical Practice Recommendations for 2011, when     HbA1c is used as a screening test:      >=6.5%   Diagnostic of Diabetes Mellitus               (if abnormal result is confirmed)     5.7-6.4%   Increased risk of developing Diabetes Mellitus     References:Diagnosis and Classification of Diabetes Mellitus,Diabetes     TKZS,0109,32(TFTDD 1):S62-S69 and Standards of Medical Care in             Diabetes - 2011,Diabetes Care,2011,34 (Suppl 1):S11-S61.   Mean Plasma Glucose 128 (*) <117 mg/dL   Comment: Performed at [facility] ACID, PLASMA     Status: None   Collection Time    10/22/13  7:54 AM      Result Value Ref Range   Lactic Acid, Venous 1.6  0.5 - 2.2 mmol/L  GLUCOSE, CAPILLARY     Status: Abnormal   Collection Time    10/22/13  4:18 PM      Result Value Ref Range   Glucose-Capillary 113 (*) 70 - 99 mg/dL  CLOSTRIDIUM DIFFICILE BY PCR     Status: Abnormal   Collection Time    10/22/13  8:19 PM  Result Value Ref Range   C difficile by pcr POSITIVE (*) NEGATIVE   Comment: CRITICAL RESULT CALLED TO, READ BACK BY AND VERIFIED WITH:     WALKER,L ON 10/22/13 AT 2145 BY LOY,C  GLUCOSE, CAPILLARY     Status: Abnormal   Collection Time    10/22/13  9:33 PM      Result Value Ref Range   Glucose-Capillary 138 (*) 70 - 99 mg/dL  BASIC METABOLIC PANEL     Status: Abnormal   Collection Time    10/23/13  6:00 AM      Result Value Ref Range   Sodium 139  137 - 147 mEq/L   Potassium 4.2  3.7 - 5.3 mEq/L   Comment: DELTA CHECK NOTED   Chloride 103  96 - 112 mEq/L   CO2 25  19 - 32 mEq/L   Glucose, Bld 74  70 - 99 mg/dL   BUN 24 (*) 6 - 23 mg/dL   Creatinine, Ser 0.56  0.50 - 1.10 mg/dL   Calcium 7.9 (*) 8.4 - 10.5 mg/dL   GFR calc non Af Amer >90  >90 mL/min   GFR calc Af Amer >90  >90 mL/min   Comment: (NOTE)     The eGFR has been calculated  using the CKD EPI equation.     This calculation has not been validated in all clinical situations.     eGFR's persistently <90 mL/min signify possible Chronic Kidney     Disease.   Anion gap 11  5 - 15    ABGS No results found for this basename: PHART, PCO2, PO2ART, TCO2, HCO3,  in the last 72 hours CULTURES Recent Results (from the past 240 hour(s))  CLOSTRIDIUM DIFFICILE BY PCR     Status: Abnormal   Collection Time    10/22/13  8:19 PM      Result Value Ref Range Status   C difficile by pcr POSITIVE (*) NEGATIVE Final   Comment: CRITICAL RESULT CALLED TO, READ BACK BY AND VERIFIED WITH:     WALKER,L ON 10/22/13 AT 2145 BY LOY,C   Studies/Results: Ct Head Wo Contrast  10/22/2013   CLINICAL DATA:  Altered mental status.  EXAM: CT HEAD WITHOUT CONTRAST  TECHNIQUE: Contiguous axial images were obtained from the base of the skull through the vertex without intravenous contrast.  COMPARISON:  Head CT 10/19/2012.  FINDINGS: No acute intracranial abnormalities. Specifically, no evidence of acute intracranial hemorrhage, no definite findings of acute/subacute cerebral ischemia, no mass, mass effect, hydrocephalus or abnormal intra or extra-axial fluid collections. Visualized paranasal sinuses and mastoids are well pneumatized. No acute displaced skull fractures are identified.  IMPRESSION: *No acute intracranial abnormalities. *The appearance of the brain is normal.   Electronically Signed   By: Vinnie Langton M.D.   On: 10/22/2013 08:11   Dg Chest Portable 1 View  10/22/2013   CLINICAL DATA:  Altered mental status.  EXAM: PORTABLE CHEST - 1 VIEW  COMPARISON:  10/11/2012.  FINDINGS: Cardiopericardial silhouette appears within normal limits. Monitoring leads project over the chest. The LEFT lung is clear. Mediastinal contours are within normal limits. There is new density at the RIGHT lung base which appears linear, most compatible with subsegmental atelectasis, probably within the RIGHT lower  lobe.  IMPRESSION: New RIGHT basilar linear opacity most compatible subsegmental atelectasis.   Electronically Signed   By: Dereck Ligas M.D.   On: 10/22/2013 07:39    Medications: I have reviewed the patient's current medications.  Assesment: Principal Problem:   Sepsis secondary to UTI Active Problems:   UTI (lower urinary tract infection)   Paraplegic spinal paralysis   Hypertension   Bipolar 1 disorder   Diabetes mellitus without complication   Pyelonephritis   Leukocytosis   Hyponatremia   Encephalopathy acute   Obesity   Hypothyroidism c.diffi colitis   Plan: Medications reviewed Continue Iv Rocephin Continue oral vancomycin Continue regular treatment    LOS: 1 day   [name] 10/23/2013, 7:55 AM

## 2013-10-23 NOTE — Clinical Social Work Psychosocial (Signed)
Clinical Social Work Department BRIEF PSYCHOSOCIAL ASSESSMENT 10/23/2013  Patient:  [name], [name]     Account Number:  [id number]     Admit date:  10/22/2013  Clinical Social Worker:  Wyatt Haste  Date/Time:  10/23/2013 09:21 AM  Referred by:  CSW  Date Referred:  10/23/2013 Referred for  SNF Placement   Other Referral:   Interview type:  Patient Other interview type:    PSYCHOSOCIAL DATA Living Status:  FACILITY Admitted from facility:  [facility] Level of care:  [place] Primary support name:  [name] Primary support relationship to patient:  CHILD, ADULT Degree of support available:   limited    CURRENT CONCERNS Current Concerns  Post-Acute Placement   Other Concerns:    SOCIAL WORK ASSESSMENT / PLAN CSW met with pt at bedside. Pt alert and oriented and reports she has been a resident at [facility] for over 6 years. She states yesterday she was very confused and did not recognize any staff at [facility]. Came to ED for evaluation and admitted with sepsis due to UTI. Pt said she has a daughter, [name] who came to visit in hospital yesterday. She lives in [place], but only visits about monthly. Pt feels they are close, just do not have regular contact. Pt requests to return to [facility] at d/c. Per Jackelyn Poling at facility, pt is long term care. She requires a lift for transfers, but pt prefers to stay in bed per report. Okay for return.   Assessment/plan status:  Psychosocial Support/Ongoing Assessment of Needs Other assessment/ plan:   Information/referral to community resources:   [facility]    PATIENT'S/FAMILY'S RESPONSE TO PLAN OF CARE: Pt reports positive feelings regarding return to [facility] when medically stable. She is very relieved to be feeling more like herself this morning. CSW will continue to follow.       Benay Pike, [place]

## 2013-10-23 NOTE — Progress Notes (Signed)
UR chart review completed.  

## 2013-10-23 NOTE — Progress Notes (Signed)
Hypoglycemic Event  CBG: 62  Treatment: 4oz orange juice and breakfast was served within the next 15 minutes.  Symptoms: None  Follow-up CBG: [id number] CBG Result:82  Comments/MD notified:Dr. Albertina Senegal, Venita Sheffield  Remember to initiate Hypoglycemia Order Set & complete

## 2013-10-24 LAB — GLUCOSE, CAPILLARY
Glucose-Capillary: 106 mg/dL — ABNORMAL HIGH (ref 70–99)
Glucose-Capillary: 90 mg/dL (ref 70–99)
Glucose-Capillary: 137 mg/dL — ABNORMAL HIGH (ref 70–99)
Glucose-Capillary: 81 mg/dL (ref 70–99)
Glucose-Capillary: 207 mg/dL — ABNORMAL HIGH (ref 70–99)

## 2013-10-24 LAB — URINE CULTURE

## 2013-10-24 NOTE — Progress Notes (Signed)
Subjective: Patient feels better today. Her diarrhea is slowing. No fever or chills.  Objective: Vital signs in last 24 hours: Temp:  [98.3 F (36.8 C)-98.6 F (37 C)] 98.6 F (37 C) (07/30 0659) Pulse Rate:  [85-92] 92 (07/30 0659) Resp:  [20] 20 (07/30 0659) BP: (102-110)/(54-70) 110/70 mmHg (07/30 0659) SpO2:  [95 %-97 %] 96 % (07/30 0659) Weight change:  Last BM Date: 10/23/13  Intake/Output from previous day: 07/29 0701 - 07/30 0700 In: 893 [P.O.:840; I.V.:3; IV Piggyback:50] Out: 4851 [Urine:4850; Stool:1]  PHYSICAL EXAM General appearance: alert and no distress Resp: clear to auscultation bilaterally Cardio: S1, S2 normal GI: soft, non-tender; bowel sounds normal; no masses,  no organomegaly Extremities: paraplegic  Lab Results:  Results for orders placed during the hospital encounter of 10/22/13 (from the past 48 hour(s))  COMPREHENSIVE METABOLIC PANEL     Status: Abnormal   Collection Time    10/22/13  7:52 AM      Result Value Ref Range   Sodium 135 (*) 137 - 147 mEq/L   Potassium 5.2  3.7 - 5.3 mEq/L   Chloride 94 (*) 96 - 112 mEq/L   CO2 30  19 - 32 mEq/L   Glucose, Bld 127 (*) 70 - 99 mg/dL   BUN 32 (*) 6 - 23 mg/dL   Creatinine, Ser 1.00  0.50 - 1.10 mg/dL   Calcium 8.8  8.4 - 10.5 mg/dL   Total Protein 7.1  6.0 - 8.3 g/dL   Albumin 3.0 (*) 3.5 - 5.2 g/dL   AST 22  0 - 37 U/L   ALT 16  0 - 35 U/L   Alkaline Phosphatase 105  39 - 117 U/L   Total Bilirubin 0.5  0.3 - 1.2 mg/dL   GFR calc non Af Amer 58 (*) >90 mL/min   GFR calc Af Amer 67 (*) >90 mL/min   Comment: (NOTE)     The eGFR has been calculated using the CKD EPI equation.     This calculation has not been validated in all clinical situations.     eGFR's persistently <90 mL/min signify possible Chronic Kidney     Disease.   Anion gap 11  5 - 15  CBC WITH DIFFERENTIAL     Status: Abnormal   Collection Time    10/22/13  7:52 AM      Result Value Ref Range   WBC 26.4 (*) 4.0 - 10.5 K/uL    RBC 4.79  3.87 - 5.11 MIL/uL   Hemoglobin 13.4  12.0 - 15.0 g/dL   HCT 41.2  36.0 - 46.0 %   MCV 86.0  78.0 - 100.0 fL   MCH 28.0  26.0 - 34.0 pg   MCHC 32.5  30.0 - 36.0 g/dL   RDW 14.3  11.5 - 15.5 %   Platelets    150 - 400 K/uL   Value: PLATELET CLUMPS NOTED ON SMEAR, COUNT APPEARS ADEQUATE   Neutrophils Relative % 72  43 - 77 %   Neutro Abs 19.1 (*) 1.7 - 7.7 K/uL   Lymphocytes Relative 20  12 - 46 %   Lymphs Abs 5.2 (*) 0.7 - 4.0 K/uL   Monocytes Relative 7  3 - 12 %   Monocytes Absolute 2.0 (*) 0.1 - 1.0 K/uL   Eosinophils Relative 0  0 - 5 %   Eosinophils Absolute 0.1  0.0 - 0.7 K/uL   Basophils Relative 0  0 - 1 %   Basophils Absolute  0.0  0.0 - 0.1 K/uL  TSH     Status: None   Collection Time    10/22/13  7:52 AM      Result Value Ref Range   TSH 0.350  0.350 - 4.500 uIU/mL   Comment: Performed at [place] A1C     Status: Abnormal   Collection Time    10/22/13  7:52 AM      Result Value Ref Range   Hemoglobin A1C 6.1 (*) <5.7 %   Comment: (NOTE)                                                                               According to the ADA Clinical Practice Recommendations for 2011, when     HbA1c is used as a screening test:      >=6.5%   Diagnostic of Diabetes Mellitus               (if abnormal result is confirmed)     5.7-6.4%   Increased risk of developing Diabetes Mellitus     References:Diagnosis and Classification of Diabetes Mellitus,Diabetes     DEYC,1448,18(HUDJS 1):S62-S69 and Standards of Medical Care in             Diabetes - 2011,Diabetes Care,2011,34 (Suppl 1):S11-S61.   Mean Plasma Glucose 128 (*) <117 mg/dL   Comment: Performed at [place] ACID, PLASMA     Status: None   Collection Time    10/22/13  7:54 AM      Result Value Ref Range   Lactic Acid, Venous 1.6  0.5 - 2.2 mmol/L  GLUCOSE, CAPILLARY     Status: Abnormal   Collection Time    10/22/13  4:18 PM      Result Value Ref Range    Glucose-Capillary 113 (*) 70 - 99 mg/dL  CLOSTRIDIUM DIFFICILE BY PCR     Status: Abnormal   Collection Time    10/22/13  8:19 PM      Result Value Ref Range   C difficile by pcr POSITIVE (*) NEGATIVE   Comment: CRITICAL RESULT CALLED TO, READ BACK BY AND VERIFIED WITH:     WALKER,L ON 10/22/13 AT 2145 BY LOY,C  GLUCOSE, CAPILLARY     Status: Abnormal   Collection Time    10/22/13  9:33 PM      Result Value Ref Range   Glucose-Capillary 138 (*) 70 - 99 mg/dL  BASIC METABOLIC PANEL     Status: Abnormal   Collection Time    10/23/13  6:00 AM      Result Value Ref Range   Sodium 139  137 - 147 mEq/L   Potassium 4.2  3.7 - 5.3 mEq/L   Comment: DELTA CHECK NOTED   Chloride 103  96 - 112 mEq/L   CO2 25  19 - 32 mEq/L   Glucose, Bld 74  70 - 99 mg/dL   BUN 24 (*) 6 - 23 mg/dL   Creatinine, Ser 0.56  0.50 - 1.10 mg/dL   Calcium 7.9 (*) 8.4 - 10.5 mg/dL   GFR calc non Af Amer >90  >90 mL/min   GFR calc Af  Amer >90  >90 mL/min   Comment: (NOTE)     The eGFR has been calculated using the CKD EPI equation.     This calculation has not been validated in all clinical situations.     eGFR's persistently <90 mL/min signify possible Chronic Kidney     Disease.   Anion gap 11  5 - 15  GLUCOSE, CAPILLARY     Status: Abnormal   Collection Time    10/23/13  7:55 AM      Result Value Ref Range   Glucose-Capillary 62 (*) 70 - 99 mg/dL   Comment 1 Notify RN    CBC     Status: Abnormal   Collection Time    10/23/13  8:13 AM      Result Value Ref Range   WBC 12.3 (*) 4.0 - 10.5 K/uL   RBC 3.88  3.87 - 5.11 MIL/uL   Hemoglobin 10.8 (*) 12.0 - 15.0 g/dL   HCT 33.9 (*) 36.0 - 46.0 %   MCV 87.4  78.0 - 100.0 fL   MCH 27.8  26.0 - 34.0 pg   MCHC 31.9  30.0 - 36.0 g/dL   RDW 14.3  11.5 - 15.5 %   Platelets 244  150 - 400 K/uL  GLUCOSE, CAPILLARY     Status: None   Collection Time    10/23/13  8:23 AM      Result Value Ref Range   Glucose-Capillary 82  70 - 99 mg/dL   Comment 1 Notify RN     GLUCOSE, CAPILLARY     Status: Abnormal   Collection Time    10/23/13 11:31 AM      Result Value Ref Range   Glucose-Capillary 134 (*) 70 - 99 mg/dL   Comment 1 Notify RN    GLUCOSE, CAPILLARY     Status: None   Collection Time    10/23/13  4:30 PM      Result Value Ref Range   Glucose-Capillary 85  70 - 99 mg/dL   Comment 1 Notify RN    GLUCOSE, CAPILLARY     Status: Abnormal   Collection Time    10/23/13  9:02 PM      Result Value Ref Range   Glucose-Capillary 106 (*) 70 - 99 mg/dL  GLUCOSE, CAPILLARY     Status: None   Collection Time    10/24/13  7:32 AM      Result Value Ref Range   Glucose-Capillary 90  70 - 99 mg/dL   Comment 1 Notify RN      ABGS No results found for this basename: PHART, PCO2, PO2ART, TCO2, HCO3,  in the last 72 hours CULTURES Recent Results (from the past 240 hour(s))  CLOSTRIDIUM DIFFICILE BY PCR     Status: Abnormal   Collection Time    10/22/13  8:19 PM      Result Value Ref Range Status   C difficile by pcr POSITIVE (*) NEGATIVE Final   Comment: CRITICAL RESULT CALLED TO, READ BACK BY AND VERIFIED WITH:     WALKER,L ON 10/22/13 AT 2145 BY LOY,C   Studies/Results: Ct Head Wo Contrast  10/22/2013   CLINICAL DATA:  Altered mental status.  EXAM: CT HEAD WITHOUT CONTRAST  TECHNIQUE: Contiguous axial images were obtained from the base of the skull through the vertex without intravenous contrast.  COMPARISON:  Head CT 10/19/2012.  FINDINGS: No acute intracranial abnormalities. Specifically, no evidence of acute intracranial hemorrhage, no definite findings of  acute/subacute cerebral ischemia, no mass, mass effect, hydrocephalus or abnormal intra or extra-axial fluid collections. Visualized paranasal sinuses and mastoids are well pneumatized. No acute displaced skull fractures are identified.  IMPRESSION: *No acute intracranial abnormalities. *The appearance of the brain is normal.   Electronically Signed   By: Vinnie Langton M.D.   On: 10/22/2013  08:11    Medications: I have reviewed the patient's current medications.  Assesment: Principal Problem:   Sepsis secondary to UTI Active Problems:   UTI (lower urinary tract infection)   Paraplegic spinal paralysis   Hypertension   Bipolar 1 disorder   Diabetes mellitus without complication   Pyelonephritis   Leukocytosis   Hyponatremia   Encephalopathy acute   Obesity   Hypothyroidism c.diffi colitis   Plan: Medications reviewed Continue Iv Rocephin Continue oral vancomycin Continue regular treatment    LOS: 2 days   Konstantin Lehnen 10/24/2013, 7:44 AM

## 2013-10-24 NOTE — Care Management Note (Addendum)
    Page 1 of 1   10/25/2013     10:45:06 AM CARE MANAGEMENT NOTE 10/25/2013  Patient:  [name], [name]   Account Number:  [id number]  Date Initiated:  10/24/2013  Documentation initiated by:  Theophilus Kinds  Subjective/Objective Assessment:   Pt admitted from [facility] sepsis and c diff. Pt will return to facility at discharge.     Action/Plan:   CSW to arrange discharge to facility.   Anticipated DC Date:  10/28/2013   Anticipated DC Plan:  SKILLED NURSING FACILITY  In-house referral  Clinical Social Worker      DC Planning Services  CM consult      Choice offered to / List presented to:             Status of service:  Completed, signed off Medicare Important Message given?  YES (If response is "NO", the following Medicare IM given date fields will be blank) Date Medicare IM given:  10/25/2013 Medicare IM given by:  Jolene Provost Date Additional Medicare IM given:   Additional Medicare IM given by:    Discharge Disposition:  [place]  Per UR Regulation:    If discussed at Long Length of Stay Meetings, dates discussed:    Comments:  10/25/2013 [place], RN, BSN, [facility] Patient plans for D/C today to [facility]. CSW to make D/C arrangements to SNF. Patient and Pt's RN aware of D/C arrangements. No CM needs noted.  10/24/13 [place], Therapist, sports BSN CM

## 2013-10-25 DIAGNOSIS — A419 Sepsis, unspecified organism: Secondary | ICD-10-CM | POA: Diagnosis not present

## 2013-10-25 DIAGNOSIS — F319 Bipolar disorder, unspecified: Secondary | ICD-10-CM | POA: Diagnosis not present

## 2013-10-25 DIAGNOSIS — F411 Generalized anxiety disorder: Secondary | ICD-10-CM | POA: Diagnosis not present

## 2013-10-25 DIAGNOSIS — I739 Peripheral vascular disease, unspecified: Secondary | ICD-10-CM | POA: Diagnosis not present

## 2013-10-25 DIAGNOSIS — E119 Type 2 diabetes mellitus without complications: Secondary | ICD-10-CM | POA: Diagnosis not present

## 2013-10-25 DIAGNOSIS — N319 Neuromuscular dysfunction of bladder, unspecified: Secondary | ICD-10-CM | POA: Diagnosis not present

## 2013-10-25 DIAGNOSIS — M6281 Muscle weakness (generalized): Secondary | ICD-10-CM | POA: Diagnosis not present

## 2013-10-25 DIAGNOSIS — N12 Tubulo-interstitial nephritis, not specified as acute or chronic: Secondary | ICD-10-CM | POA: Diagnosis not present

## 2013-10-25 DIAGNOSIS — E669 Obesity, unspecified: Secondary | ICD-10-CM | POA: Diagnosis not present

## 2013-10-25 DIAGNOSIS — D72829 Elevated white blood cell count, unspecified: Secondary | ICD-10-CM | POA: Diagnosis not present

## 2013-10-25 DIAGNOSIS — M159 Polyosteoarthritis, unspecified: Secondary | ICD-10-CM | POA: Diagnosis not present

## 2013-10-25 DIAGNOSIS — J45909 Unspecified asthma, uncomplicated: Secondary | ICD-10-CM | POA: Diagnosis not present

## 2013-10-25 DIAGNOSIS — G9340 Encephalopathy, unspecified: Secondary | ICD-10-CM | POA: Diagnosis not present

## 2013-10-25 DIAGNOSIS — D649 Anemia, unspecified: Secondary | ICD-10-CM | POA: Diagnosis not present

## 2013-10-25 DIAGNOSIS — A0472 Enterocolitis due to Clostridium difficile, not specified as recurrent: Secondary | ICD-10-CM | POA: Diagnosis not present

## 2013-10-25 DIAGNOSIS — E039 Hypothyroidism, unspecified: Secondary | ICD-10-CM | POA: Diagnosis not present

## 2013-10-25 DIAGNOSIS — G8220 Paraplegia, unspecified: Secondary | ICD-10-CM | POA: Diagnosis not present

## 2013-10-25 DIAGNOSIS — F3289 Other specified depressive episodes: Secondary | ICD-10-CM | POA: Diagnosis not present

## 2013-10-25 DIAGNOSIS — I119 Hypertensive heart disease without heart failure: Secondary | ICD-10-CM | POA: Diagnosis not present

## 2013-10-25 DIAGNOSIS — F329 Major depressive disorder, single episode, unspecified: Secondary | ICD-10-CM | POA: Diagnosis not present

## 2013-10-25 DIAGNOSIS — E871 Hypo-osmolality and hyponatremia: Secondary | ICD-10-CM | POA: Diagnosis not present

## 2013-10-25 LAB — BASIC METABOLIC PANEL
ANION GAP: 11 (ref 5–15)
BUN: 11 mg/dL (ref 6–23)
CHLORIDE: 103 meq/L (ref 96–112)
CO2: 25 mEq/L (ref 19–32)
Calcium: 8.6 mg/dL (ref 8.4–10.5)
Creatinine, Ser: 0.47 mg/dL — ABNORMAL LOW (ref 0.50–1.10)
GFR calc Af Amer: >90 mL/min (ref >90)
GFR calc non Af Amer: >90 mL/min (ref >90)
GLUCOSE: 97 mg/dL (ref 70–99)
Potassium: 4.5 mEq/L (ref 3.7–5.3)
Sodium: 139 mEq/L (ref 137–147)

## 2013-10-25 LAB — CBC
HCT: 32.3 % — ABNORMAL LOW (ref 36.0–46.0)
Hemoglobin: 10.3 g/dL — ABNORMAL LOW (ref 12.0–15.0)
MCH: 27.6 pg (ref 26.0–34.0)
MCHC: 31.9 g/dL (ref 30.0–36.0)
MCV: 86.6 fL (ref 78.0–100.0)
PLATELETS: 225 10*3/uL (ref 150–400)
RBC: 3.73 MIL/uL — AB (ref 3.87–5.11)
RDW: 13.9 % (ref 11.5–15.5)
WBC: 6.1 10*3/uL (ref 4.0–10.5)

## 2013-10-25 LAB — GLUCOSE, CAPILLARY: GLUCOSE-CAPILLARY: 99 mg/dL (ref 70–99)

## 2013-10-25 MED ORDER — CIPROFLOXACIN HCL 500 MG PO TABS: 500.0000 mg | ORAL_TABLET | Freq: Two times a day (BID) | ORAL | Status: DC

## 2013-10-25 MED ORDER — VANCOMYCIN HCL 125 MG PO CAPS: 125.0000 mg | ORAL_CAPSULE | Freq: Four times a day (QID) | ORAL | Status: DC

## 2013-10-25 NOTE — Discharge Summary (Signed)
Physician Discharge Summary  Patient ID: [name] MRN: [id number] DOB/AGE: Jan 05, 1946 68 y.o. Primary Care Physician:Anayansi Rundquist, MD Admit date: 10/22/2013 Discharge date: 10/25/2013    Discharge Diagnoses:   Principal Problem:   Sepsis secondary to UTI Active Problems:   UTI (lower urinary tract infection)   Paraplegic spinal paralysis   Hypertension   Bipolar 1 disorder   Diabetes mellitus without complication   Pyelonephritis   Leukocytosis   Hyponatremia   Encephalopathy acute   Obesity   Hypothyroidism C.diffi Colitis   Medication List         acetaminophen 500 MG tablet  Commonly known as:  TYLENOL  Take 500 mg by mouth every 6 (six) hours as needed for pain.     acidophilus Caps capsule  Take 1 capsule by mouth 2 (two) times daily. For 20 days starting on 10/03/13     alendronate 70 MG tablet  Commonly known as:  FOSAMAX  Take 70 mg by mouth once a week. Take with a full glass of water on an empty stomach.     alum & mag hydroxide-simeth 200-200-20 MG/5ML suspension  Commonly known as:  MAALOX/MYLANTA  Take 30 mLs by mouth every 4 (four) hours as needed for indigestion or heartburn.     aspirin EC 81 MG tablet  Take 81 mg by mouth daily.     baclofen 10 MG tablet  Commonly known as:  LIORESAL  Take 10 mg by mouth 2 (two) times daily.     buPROPion 150 MG 24 hr tablet  Commonly known as:  WELLBUTRIN XL  Take 150 mg by mouth daily.     buPROPion 300 MG 24 hr tablet  Commonly known as:  WELLBUTRIN XL  Take 300 mg by mouth daily.     calcium citrate-vitamin D 500-400 MG-UNIT chewable tablet  Chew 1 tablet by mouth 2 (two) times daily.     carvedilol 12.5 MG tablet  Commonly known as:  COREG  Take 12.5 mg by mouth 2 (two) times daily with a meal.     cetirizine 10 MG tablet  Commonly known as:  ZYRTEC  Take 10 mg by mouth daily.     ciprofloxacin 500 MG tablet  Commonly known as:  CIPRO  Take 1 tablet (500 mg total) by mouth 2 (two)  times daily.     clonazePAM 1 MG tablet  Commonly known as:  KLONOPIN  Take 1 mg by mouth 3 (three) times daily.     diclofenac 50 MG EC tablet  Commonly known as:  VOLTAREN  Take 50 mg by mouth 2 (two) times daily.     ezetimibe 10 MG tablet  Commonly known as:  ZETIA  Take 10 mg by mouth daily.     fentaNYL 25 MCG/HR patch  Commonly known as:  DURAGESIC - dosed mcg/hr  Place 25 mcg onto the skin every 3 (three) days. In addition to the 100 mcg patch     fentaNYL 100 MCG/HR  Commonly known as:  Faison - dosed mcg/hr  Place 1 patch onto the skin every 3 (three) days.     furosemide 40 MG tablet  Commonly known as:  LASIX  Take 40 mg by mouth daily.     gabapentin 300 MG capsule  Commonly known as:  NEURONTIN  Take 300 mg by mouth 3 (three) times daily.     GLUCOSAMINE CHONDR COMPLEX PO  Take 1 tablet by mouth 3 (three) times daily.     ipratropium-albuterol  0.5-2.5 (3) MG/3ML Soln  Commonly known as:  DUONEB  Take 3 mLs by nebulization every 6 (six) hours.     levothyroxine 25 MCG tablet  Commonly known as:  SYNTHROID, LEVOTHROID  Take 25 mcg by mouth daily. Takes with Synthroid (Levothyroxine) 231mcg for a total of 254mcg daily at 6:00am     levothyroxine 200 MCG tablet  Commonly known as:  SYNTHROID, LEVOTHROID  Take 200 mcg by mouth daily. Takes with Synthroid (Levothyroxine) 40mcg for a total of 261mcg daily at 6:00am     lisinopril 5 MG tablet  Commonly known as:  PRINIVIL,ZESTRIL  Take 5 mg by mouth daily.     LORazepam 0.5 MG tablet  Commonly known as:  ATIVAN  Take 0.25 mg by mouth every 6 (six) hours as needed for anxiety.     montelukast 10 MG tablet  Commonly known as:  SINGULAIR  Take 10 mg by mouth daily.     multivitamin with minerals Tabs tablet  Take 1 tablet by mouth daily. DAILY-VITE     omeprazole 20 MG capsule  Commonly known as:  PRILOSEC  Take 20 mg by mouth daily.     OxyCODONE HCl 7.5 MG Taba  Take 1 tablet by mouth every 6  (six) hours as needed (for pain).     POLY-IRON 150 150 MG capsule  Generic drug:  iron polysaccharides  Take 150 mg by mouth daily.     polyethylene glycol powder powder  Commonly known as:  GLYCOLAX/MIRALAX  Take 17 g by mouth daily.     potassium chloride 10 MEQ tablet  Commonly known as:  K-DUR  Take 10 mEq by mouth daily.     promethazine 12.5 MG tablet  Commonly known as:  PHENERGAN  Take 12.5 mg by mouth every 6 (six) hours as needed for nausea or vomiting.     PROVENTIL HFA 108 (90 BASE) MCG/ACT inhaler  Generic drug:  albuterol  Inhale 2 puffs into the lungs 3 (three) times daily.     pseudoephedrine-guaifenesin 60-600 MG per tablet  Commonly known as:  MUCINEX D  Take 1 tablet by mouth every 12 (twelve) hours as needed for congestion.     pyridOXINE 100 MG tablet  Commonly known as:  VITAMIN B-6  Take 100 mg by mouth 2 (two) times daily.     rizatriptan 10 MG tablet  Commonly known as:  MAXALT  Take 10 mg by mouth every 8 (eight) hours as needed for migraine. May repeat in 2 hours if needed     SEA-OMEGA PO  Take 1 capsule by mouth 2 (two) times daily.     tiZANidine 2 MG tablet  Commonly known as:  ZANAFLEX  Take 2 mg by mouth every 6 (six) hours as needed for muscle spasms.     vancomycin 125 MG capsule  Commonly known as:  VANCOCIN HCL  Take 1 capsule (125 mg total) by mouth 4 (four) times daily.        Discharged Condition: improved    Consults: None  Significant Diagnostic Studies: Ct Head Wo Contrast  10/22/2013   CLINICAL DATA:  Altered mental status.  EXAM: CT HEAD WITHOUT CONTRAST  TECHNIQUE: Contiguous axial images were obtained from the base of the skull through the vertex without intravenous contrast.  COMPARISON:  Head CT 10/19/2012.  FINDINGS: No acute intracranial abnormalities. Specifically, no evidence of acute intracranial hemorrhage, no definite findings of acute/subacute cerebral ischemia, no mass, mass effect, hydrocephalus or  abnormal intra  or extra-axial fluid collections. Visualized paranasal sinuses and mastoids are well pneumatized. No acute displaced skull fractures are identified.  IMPRESSION: *No acute intracranial abnormalities. *The appearance of the brain is normal.   Electronically Signed   By: Vinnie Langton M.D.   On: 10/22/2013 08:11   Dg Chest Portable 1 View  10/22/2013   CLINICAL DATA:  Altered mental status.  EXAM: PORTABLE CHEST - 1 VIEW  COMPARISON:  10/11/2012.  FINDINGS: Cardiopericardial silhouette appears within normal limits. Monitoring leads project over the chest. The LEFT lung is clear. Mediastinal contours are within normal limits. There is new density at the RIGHT lung base which appears linear, most compatible with subsegmental atelectasis, probably within the RIGHT lower lobe.  IMPRESSION: New RIGHT basilar linear opacity most compatible subsegmental atelectasis.   Electronically Signed   By: Dereck Ligas M.D.   On: 10/22/2013 07:39    Lab Results: Basic Metabolic Panel:  Recent Labs  10/23/13 0600 10/25/13 0611  NA 139 139  K 4.2 4.5  CL 103 103  CO2 25 25  GLUCOSE 74 97  BUN 24* 11  CREATININE 0.56 0.47*  CALCIUM 7.9* 8.6   Liver Function Tests: No results found for this basename: AST, ALT, ALKPHOS, BILITOT, PROT, ALBUMIN,  in the last 72 hours   CBC:  Recent Labs  10/23/13 0813 10/25/13 0611  WBC 12.3* 6.1  HGB 10.8* 10.3*  HCT 33.9* 32.3*  MCV 87.4 86.6  PLT 244 225    Recent Results (from the past 240 hour(s))  URINE CULTURE     Status: None   Collection Time    10/22/13  3:38 PM      Result Value Ref Range Status   Specimen Description URINE, CLEAN CATCH   Final   Special Requests NONE   Final   Culture  Setup Time     Final   Value: 10/23/2013 00:16     Performed at [facility]     Final   Value: >=100,000 COLONIES/ML     Performed at [facility]   Culture     Final   Value: Multiple bacterial morphotypes  present, none predominant. Suggest appropriate recollection if clinically indicated.     Performed at [facility]   Report Status 10/24/2013 FINAL   Final  CLOSTRIDIUM DIFFICILE BY PCR     Status: Abnormal   Collection Time    10/22/13  8:19 PM      Result Value Ref Range Status   C difficile by pcr POSITIVE (*) NEGATIVE Final   Comment: CRITICAL RESULT CALLED TO, READ BACK BY AND VERIFIED WITH:     WALKER,L ON 10/22/13 AT 2145 BY [facility] Course:  This is a 68 years old female with history of multiple medical illnesses who was a resident of nursing admitted due to change in mental status and diarrhea. Patient was found to have UTI and C.diffi Colitis. Patient was treated treated with IV antibiotics and oral vancomycin. Patient has improved and is back to her baseline.  Discharge Exam: Blood pressure 140/69, pulse 75, temperature 98.1 F (36.7 C), temperature source Oral, resp. rate 20, height 5\' 9"  (1.753 m), weight 156.945 kg (346 lb), SpO2 96.00%.   Disposition:  Nursing home      Signed: Anelis Hrivnak   10/25/2013, 8:40 AM

## 2013-10-25 NOTE — Progress Notes (Signed)
Late entry for 1100: pt d/c via EMS, back to [facility]. IV's d/c without complications. Report was called to nurse at [place]. No questions at this time. Room was checked for pts belongings. Marry Guan

## 2013-10-25 NOTE — Clinical Social Work Note (Signed)
Pt d/c today back to [facility]. Pt and facility aware and agreeable. D/C summary faxed. Pt to transfer via [facility]. Pt does not want CSW to call her daughter and said she will do it later today.   Benay Pike, [place]

## 2013-10-26 NOTE — Progress Notes (Addendum)
Late entry for 10/25/13 at 0900: pt received 7.5 mg oxycodone, unable to waste in pyxis because had been d/c in pyxis when I went to chart the waste. 2.5 mg wasted. Marry Guan

## 2014-01-31 DIAGNOSIS — L539 Erythematous condition, unspecified: Secondary | ICD-10-CM | POA: Diagnosis not present

## 2014-01-31 DIAGNOSIS — G8221 Paraplegia, complete: Secondary | ICD-10-CM | POA: Diagnosis not present

## 2014-01-31 DIAGNOSIS — N311 Reflex neuropathic bladder, not elsewhere classified: Secondary | ICD-10-CM | POA: Diagnosis not present

## 2014-01-31 DIAGNOSIS — T63394A Toxic effect of venom of other spider, undetermined, initial encounter: Secondary | ICD-10-CM | POA: Diagnosis not present

## 2014-01-31 DIAGNOSIS — R6889 Other general symptoms and signs: Secondary | ICD-10-CM | POA: Diagnosis not present

## 2014-01-31 DIAGNOSIS — J45998 Other asthma: Secondary | ICD-10-CM | POA: Diagnosis not present

## 2014-01-31 DIAGNOSIS — N3289 Other specified disorders of bladder: Secondary | ICD-10-CM | POA: Diagnosis not present

## 2014-01-31 DIAGNOSIS — G8929 Other chronic pain: Secondary | ICD-10-CM | POA: Diagnosis not present

## 2014-02-02 DIAGNOSIS — G8929 Other chronic pain: Secondary | ICD-10-CM | POA: Diagnosis not present

## 2014-02-02 DIAGNOSIS — G8221 Paraplegia, complete: Secondary | ICD-10-CM | POA: Diagnosis not present

## 2014-02-02 DIAGNOSIS — L539 Erythematous condition, unspecified: Secondary | ICD-10-CM | POA: Diagnosis not present

## 2014-02-02 DIAGNOSIS — T63394A Toxic effect of venom of other spider, undetermined, initial encounter: Secondary | ICD-10-CM | POA: Diagnosis not present

## 2014-02-02 DIAGNOSIS — N311 Reflex neuropathic bladder, not elsewhere classified: Secondary | ICD-10-CM | POA: Diagnosis not present

## 2014-02-02 DIAGNOSIS — N3289 Other specified disorders of bladder: Secondary | ICD-10-CM | POA: Diagnosis not present

## 2014-02-02 DIAGNOSIS — J45998 Other asthma: Secondary | ICD-10-CM | POA: Diagnosis not present

## 2014-02-02 DIAGNOSIS — R6889 Other general symptoms and signs: Secondary | ICD-10-CM | POA: Diagnosis not present

## 2014-02-09 DIAGNOSIS — L0291 Cutaneous abscess, unspecified: Secondary | ICD-10-CM | POA: Diagnosis not present

## 2014-02-25 DIAGNOSIS — L539 Erythematous condition, unspecified: Secondary | ICD-10-CM | POA: Diagnosis not present

## 2014-02-25 DIAGNOSIS — R6889 Other general symptoms and signs: Secondary | ICD-10-CM | POA: Diagnosis not present

## 2014-02-25 DIAGNOSIS — G8221 Paraplegia, complete: Secondary | ICD-10-CM | POA: Diagnosis not present

## 2014-02-25 DIAGNOSIS — J45998 Other asthma: Secondary | ICD-10-CM | POA: Diagnosis not present

## 2014-02-25 DIAGNOSIS — N3289 Other specified disorders of bladder: Secondary | ICD-10-CM | POA: Diagnosis not present

## 2014-02-25 DIAGNOSIS — T63394A Toxic effect of venom of other spider, undetermined, initial encounter: Secondary | ICD-10-CM | POA: Diagnosis not present

## 2014-02-25 DIAGNOSIS — N311 Reflex neuropathic bladder, not elsewhere classified: Secondary | ICD-10-CM | POA: Diagnosis not present

## 2014-02-25 DIAGNOSIS — G8929 Other chronic pain: Secondary | ICD-10-CM | POA: Diagnosis not present

## 2014-03-02 DIAGNOSIS — N311 Reflex neuropathic bladder, not elsewhere classified: Secondary | ICD-10-CM | POA: Diagnosis not present

## 2014-03-02 DIAGNOSIS — M109 Gout, unspecified: Secondary | ICD-10-CM | POA: Diagnosis not present

## 2014-03-02 DIAGNOSIS — G8221 Paraplegia, complete: Secondary | ICD-10-CM | POA: Diagnosis not present

## 2014-03-02 DIAGNOSIS — N3289 Other specified disorders of bladder: Secondary | ICD-10-CM | POA: Diagnosis not present

## 2014-03-02 DIAGNOSIS — J45998 Other asthma: Secondary | ICD-10-CM | POA: Diagnosis not present

## 2014-03-02 DIAGNOSIS — L539 Erythematous condition, unspecified: Secondary | ICD-10-CM | POA: Diagnosis not present

## 2014-03-02 DIAGNOSIS — T63394A Toxic effect of venom of other spider, undetermined, initial encounter: Secondary | ICD-10-CM | POA: Diagnosis not present

## 2014-03-02 DIAGNOSIS — R6889 Other general symptoms and signs: Secondary | ICD-10-CM | POA: Diagnosis not present

## 2014-03-02 DIAGNOSIS — G8929 Other chronic pain: Secondary | ICD-10-CM | POA: Diagnosis not present

## 2014-03-11 DIAGNOSIS — N390 Urinary tract infection, site not specified: Secondary | ICD-10-CM | POA: Diagnosis not present

## 2014-03-11 DIAGNOSIS — R6889 Other general symptoms and signs: Secondary | ICD-10-CM | POA: Diagnosis not present

## 2014-03-11 DIAGNOSIS — M129 Arthropathy, unspecified: Secondary | ICD-10-CM | POA: Diagnosis not present

## 2014-03-11 DIAGNOSIS — M109 Gout, unspecified: Secondary | ICD-10-CM | POA: Diagnosis not present

## 2014-03-14 DIAGNOSIS — R6889 Other general symptoms and signs: Secondary | ICD-10-CM | POA: Diagnosis not present

## 2014-03-14 DIAGNOSIS — N390 Urinary tract infection, site not specified: Secondary | ICD-10-CM | POA: Diagnosis not present

## 2014-03-14 DIAGNOSIS — M109 Gout, unspecified: Secondary | ICD-10-CM | POA: Diagnosis not present

## 2014-03-14 DIAGNOSIS — M129 Arthropathy, unspecified: Secondary | ICD-10-CM | POA: Diagnosis not present

## 2014-03-16 DIAGNOSIS — R6889 Other general symptoms and signs: Secondary | ICD-10-CM | POA: Diagnosis not present

## 2014-03-16 DIAGNOSIS — M129 Arthropathy, unspecified: Secondary | ICD-10-CM | POA: Diagnosis not present

## 2014-03-16 DIAGNOSIS — N390 Urinary tract infection, site not specified: Secondary | ICD-10-CM | POA: Diagnosis not present

## 2014-03-16 DIAGNOSIS — M109 Gout, unspecified: Secondary | ICD-10-CM | POA: Diagnosis not present

## 2014-03-18 DIAGNOSIS — K5900 Constipation, unspecified: Secondary | ICD-10-CM | POA: Diagnosis not present

## 2014-03-18 DIAGNOSIS — M109 Gout, unspecified: Secondary | ICD-10-CM | POA: Diagnosis not present

## 2014-03-18 DIAGNOSIS — R6889 Other general symptoms and signs: Secondary | ICD-10-CM | POA: Diagnosis not present

## 2014-03-18 DIAGNOSIS — L0291 Cutaneous abscess, unspecified: Secondary | ICD-10-CM | POA: Diagnosis not present

## 2014-03-27 ENCOUNTER — Emergency Department (HOSPITAL_COMMUNITY): Payer: PRIVATE HEALTH INSURANCE

## 2014-03-27 ENCOUNTER — Inpatient Hospital Stay (HOSPITAL_COMMUNITY)
Admission: EM | Admit: 2014-03-27 | Discharge: 2014-04-01 | DRG: 871 | Disposition: A | Discharge status: Skilled Nursing Facility | Payer: PRIVATE HEALTH INSURANCE | Attending: Internal Medicine | Admitting: Internal Medicine

## 2014-03-27 ENCOUNTER — Encounter (HOSPITAL_COMMUNITY): Payer: Self-pay | Admitting: *Deleted

## 2014-03-27 DIAGNOSIS — Z87891 Personal history of nicotine dependence: Secondary | ICD-10-CM

## 2014-03-27 DIAGNOSIS — J449 Chronic obstructive pulmonary disease, unspecified: Secondary | ICD-10-CM | POA: Diagnosis present

## 2014-03-27 DIAGNOSIS — L89150 Pressure ulcer of sacral region, unstageable: Secondary | ICD-10-CM | POA: Diagnosis present

## 2014-03-27 DIAGNOSIS — D649 Anemia, unspecified: Secondary | ICD-10-CM | POA: Diagnosis not present

## 2014-03-27 DIAGNOSIS — F32A Depression, unspecified: Secondary | ICD-10-CM | POA: Diagnosis present

## 2014-03-27 DIAGNOSIS — Z9049 Acquired absence of other specified parts of digestive tract: Secondary | ICD-10-CM | POA: Diagnosis present

## 2014-03-27 DIAGNOSIS — G9340 Encephalopathy, unspecified: Secondary | ICD-10-CM | POA: Diagnosis present

## 2014-03-27 DIAGNOSIS — Z6841 Body Mass Index (BMI) 40.0 and over, adult: Secondary | ICD-10-CM

## 2014-03-27 DIAGNOSIS — R197 Diarrhea, unspecified: Secondary | ICD-10-CM | POA: Diagnosis present

## 2014-03-27 DIAGNOSIS — I10 Essential (primary) hypertension: Secondary | ICD-10-CM | POA: Diagnosis present

## 2014-03-27 DIAGNOSIS — J45909 Unspecified asthma, uncomplicated: Secondary | ICD-10-CM | POA: Diagnosis present

## 2014-03-27 DIAGNOSIS — F419 Anxiety disorder, unspecified: Secondary | ICD-10-CM | POA: Diagnosis not present

## 2014-03-27 DIAGNOSIS — R4182 Altered mental status, unspecified: Secondary | ICD-10-CM | POA: Diagnosis not present

## 2014-03-27 DIAGNOSIS — J9600 Acute respiratory failure, unspecified whether with hypoxia or hypercapnia: Secondary | ICD-10-CM | POA: Diagnosis not present

## 2014-03-27 DIAGNOSIS — E079 Disorder of thyroid, unspecified: Secondary | ICD-10-CM | POA: Diagnosis not present

## 2014-03-27 DIAGNOSIS — L89159 Pressure ulcer of sacral region, unspecified stage: Secondary | ICD-10-CM | POA: Diagnosis present

## 2014-03-27 DIAGNOSIS — E871 Hypo-osmolality and hyponatremia: Secondary | ICD-10-CM | POA: Diagnosis not present

## 2014-03-27 DIAGNOSIS — J9690 Respiratory failure, unspecified, unspecified whether with hypoxia or hypercapnia: Secondary | ICD-10-CM

## 2014-03-27 DIAGNOSIS — G8220 Paraplegia, unspecified: Secondary | ICD-10-CM | POA: Diagnosis present

## 2014-03-27 DIAGNOSIS — Z885 Allergy status to narcotic agent status: Secondary | ICD-10-CM

## 2014-03-27 DIAGNOSIS — L259 Unspecified contact dermatitis, unspecified cause: Secondary | ICD-10-CM | POA: Diagnosis present

## 2014-03-27 DIAGNOSIS — I119 Hypertensive heart disease without heart failure: Secondary | ICD-10-CM | POA: Diagnosis not present

## 2014-03-27 DIAGNOSIS — Z7401 Bed confinement status: Secondary | ICD-10-CM | POA: Diagnosis not present

## 2014-03-27 DIAGNOSIS — A419 Sepsis, unspecified organism: Secondary | ICD-10-CM | POA: Diagnosis not present

## 2014-03-27 DIAGNOSIS — F339 Major depressive disorder, recurrent, unspecified: Secondary | ICD-10-CM | POA: Diagnosis not present

## 2014-03-27 DIAGNOSIS — E119 Type 2 diabetes mellitus without complications: Secondary | ICD-10-CM | POA: Diagnosis not present

## 2014-03-27 DIAGNOSIS — E039 Hypothyroidism, unspecified: Secondary | ICD-10-CM | POA: Diagnosis present

## 2014-03-27 DIAGNOSIS — F0390 Unspecified dementia without behavioral disturbance: Secondary | ICD-10-CM | POA: Diagnosis present

## 2014-03-27 DIAGNOSIS — R279 Unspecified lack of coordination: Secondary | ICD-10-CM | POA: Diagnosis not present

## 2014-03-27 DIAGNOSIS — Z888 Allergy status to other drugs, medicaments and biological substances status: Secondary | ICD-10-CM | POA: Diagnosis not present

## 2014-03-27 DIAGNOSIS — M6281 Muscle weakness (generalized): Secondary | ICD-10-CM | POA: Diagnosis not present

## 2014-03-27 DIAGNOSIS — T50904A Poisoning by unspecified drugs, medicaments and biological substances, undetermined, initial encounter: Secondary | ICD-10-CM | POA: Diagnosis not present

## 2014-03-27 DIAGNOSIS — N12 Tubulo-interstitial nephritis, not specified as acute or chronic: Secondary | ICD-10-CM | POA: Diagnosis not present

## 2014-03-27 DIAGNOSIS — J9691 Respiratory failure, unspecified with hypoxia: Secondary | ICD-10-CM | POA: Diagnosis not present

## 2014-03-27 DIAGNOSIS — F319 Bipolar disorder, unspecified: Secondary | ICD-10-CM | POA: Diagnosis present

## 2014-03-27 DIAGNOSIS — R401 Stupor: Secondary | ICD-10-CM | POA: Diagnosis not present

## 2014-03-27 DIAGNOSIS — N390 Urinary tract infection, site not specified: Secondary | ICD-10-CM | POA: Diagnosis not present

## 2014-03-27 DIAGNOSIS — K219 Gastro-esophageal reflux disease without esophagitis: Secondary | ICD-10-CM | POA: Diagnosis present

## 2014-03-27 DIAGNOSIS — M150 Primary generalized (osteo)arthritis: Secondary | ICD-10-CM | POA: Diagnosis not present

## 2014-03-27 DIAGNOSIS — R6521 Severe sepsis with septic shock: Secondary | ICD-10-CM | POA: Diagnosis present

## 2014-03-27 DIAGNOSIS — E860 Dehydration: Secondary | ICD-10-CM | POA: Diagnosis present

## 2014-03-27 DIAGNOSIS — R509 Fever, unspecified: Secondary | ICD-10-CM

## 2014-03-27 DIAGNOSIS — N179 Acute kidney failure, unspecified: Secondary | ICD-10-CM | POA: Diagnosis present

## 2014-03-27 DIAGNOSIS — J9601 Acute respiratory failure with hypoxia: Secondary | ICD-10-CM | POA: Diagnosis present

## 2014-03-27 DIAGNOSIS — E785 Hyperlipidemia, unspecified: Secondary | ICD-10-CM | POA: Diagnosis not present

## 2014-03-27 DIAGNOSIS — I739 Peripheral vascular disease, unspecified: Secondary | ICD-10-CM | POA: Diagnosis not present

## 2014-03-27 DIAGNOSIS — E669 Obesity, unspecified: Secondary | ICD-10-CM | POA: Diagnosis not present

## 2014-03-27 DIAGNOSIS — J189 Pneumonia, unspecified organism: Secondary | ICD-10-CM | POA: Diagnosis not present

## 2014-03-27 DIAGNOSIS — Z7982 Long term (current) use of aspirin: Secondary | ICD-10-CM | POA: Diagnosis not present

## 2014-03-27 DIAGNOSIS — F329 Major depressive disorder, single episode, unspecified: Secondary | ICD-10-CM | POA: Diagnosis present

## 2014-03-27 DIAGNOSIS — A047 Enterocolitis due to Clostridium difficile: Secondary | ICD-10-CM | POA: Diagnosis not present

## 2014-03-27 DIAGNOSIS — D72829 Elevated white blood cell count, unspecified: Secondary | ICD-10-CM | POA: Diagnosis not present

## 2014-03-27 LAB — COMPREHENSIVE METABOLIC PANEL
ALT: 31 U/L (ref 0–35)
ANION GAP: 10 (ref 5–15)
AST: 46 U/L — ABNORMAL HIGH (ref 0–37)
Albumin: 3.7 g/dL (ref 3.5–5.2)
Alkaline Phosphatase: 128 U/L — ABNORMAL HIGH (ref 39–117)
BUN: 50 mg/dL — AB (ref 6–23)
CALCIUM: 8.9 mg/dL (ref 8.4–10.5)
CO2: 26 mmol/L (ref 19–32)
CREATININE: 1.65 mg/dL — AB (ref 0.50–1.10)
Chloride: 102 mEq/L (ref 96–112)
GFR, EST AFRICAN AMERICAN: 36 mL/min — AB (ref >90)
GFR, EST NON AFRICAN AMERICAN: 31 mL/min — AB (ref >90)
Glucose, Bld: 201 mg/dL — ABNORMAL HIGH (ref 70–99)
Potassium: 5.6 mmol/L — ABNORMAL HIGH (ref 3.5–5.1)
Sodium: 138 mmol/L (ref 135–145)
Total Bilirubin: 0.6 mg/dL (ref 0.3–1.2)
Total Protein: 7.6 g/dL (ref 6.0–8.3)

## 2014-03-27 LAB — URINALYSIS, ROUTINE W REFLEX MICROSCOPIC
BILIRUBIN URINE: NEGATIVE
GLUCOSE, UA: NEGATIVE mg/dL
KETONES UR: NEGATIVE mg/dL
Nitrite: NEGATIVE
PH: 5.5 (ref 5.0–8.0)
Protein, ur: NEGATIVE mg/dL
Specific Gravity, Urine: 1.015 (ref 1.005–1.030)
Urobilinogen, UA: 0.2 mg/dL (ref 0.0–1.0)

## 2014-03-27 LAB — RAPID URINE DRUG SCREEN, HOSP PERFORMED
Amphetamines: NOT DETECTED
BARBITURATES: NOT DETECTED
Benzodiazepines: NOT DETECTED
COCAINE: NOT DETECTED
Opiates: NOT DETECTED
Tetrahydrocannabinol: NOT DETECTED

## 2014-03-27 LAB — URINE MICROSCOPIC-ADD ON

## 2014-03-27 LAB — BLOOD GAS, ARTERIAL
Acid-base deficit: 2.7 mmol/L — ABNORMAL HIGH (ref 0.0–2.0)
Bicarbonate: 20.6 mEq/L (ref 20.0–24.0)
Drawn by: 25788
FIO2: 100 %
MECHVT: 500 mL
O2 Saturation: 97.7 %
PCO2 ART: 29.9 mmHg — AB (ref 35.0–45.0)
PEEP: 5 cmH2O
Patient temperature: 37
RATE: 14 resp/min
TCO2: 18 mmol/L (ref 0–100)
pH, Arterial: 7.453 — ABNORMAL HIGH (ref 7.350–7.450)
pO2, Arterial: 464 mmHg — ABNORMAL HIGH (ref 80.0–100.0)

## 2014-03-27 LAB — GLUCOSE, CAPILLARY
GLUCOSE-CAPILLARY: 184 mg/dL — AB (ref 70–99)
GLUCOSE-CAPILLARY: 169 mg/dL — AB (ref 70–99)
Glucose-Capillary: 123 mg/dL — ABNORMAL HIGH (ref 70–99)

## 2014-03-27 LAB — CBC WITH DIFFERENTIAL/PLATELET
Basophils Absolute: 0 10*3/uL (ref 0.0–0.1)
Basophils Relative: 0 % (ref 0–1)
EOS ABS: 0 10*3/uL (ref 0.0–0.7)
EOS PCT: 0 % (ref 0–5)
HCT: 41.5 % (ref 36.0–46.0)
Hemoglobin: 12.3 g/dL (ref 12.0–15.0)
LYMPHS ABS: 2.5 10*3/uL (ref 0.7–4.0)
Lymphocytes Relative: 8 % — ABNORMAL LOW (ref 12–46)
MCH: 25.9 pg — AB (ref 26.0–34.0)
MCHC: 29.6 g/dL — AB (ref 30.0–36.0)
MCV: 87.4 fL (ref 78.0–100.0)
MONO ABS: 1.6 10*3/uL — AB (ref 0.1–1.0)
Monocytes Relative: 5 % (ref 3–12)
Neutro Abs: 27.3 10*3/uL — ABNORMAL HIGH (ref 1.7–7.7)
Neutrophils Relative %: 87 % — ABNORMAL HIGH (ref 43–77)
PLATELETS: 404 10*3/uL — AB (ref 150–400)
RBC: 4.75 MIL/uL (ref 3.87–5.11)
RDW: 14.4 % (ref 11.5–15.5)
WBC: 31.4 10*3/uL — ABNORMAL HIGH (ref 4.0–10.5)

## 2014-03-27 LAB — PROTIME-INR
INR: 1.26 (ref 0.00–1.49)
Prothrombin Time: 15.9 seconds — ABNORMAL HIGH (ref 11.6–15.2)

## 2014-03-27 LAB — I-STAT CG4 LACTIC ACID, ED: LACTIC ACID, VENOUS: 2.81 mmol/L — AB (ref 0.5–2.2)

## 2014-03-27 LAB — ACETAMINOPHEN LEVEL

## 2014-03-27 LAB — CLOSTRIDIUM DIFFICILE BY PCR: Toxigenic C. Difficile by PCR: NEGATIVE

## 2014-03-27 LAB — SALICYLATE LEVEL

## 2014-03-27 LAB — TROPONIN I

## 2014-03-27 LAB — ETHANOL: Alcohol, Ethyl (B): <5 mg/dL (ref 0–9)

## 2014-03-27 LAB — MRSA PCR SCREENING: MRSA by PCR: POSITIVE — AB

## 2014-03-27 MED ORDER — SODIUM CHLORIDE 0.9 % IV SOLN
10.0000 ug/h | INTRAVENOUS | Status: DC
  Administered 2014-03-27: 10 ug/h via INTRAVENOUS
  Filled 2014-03-27 (×2): qty 50

## 2014-03-27 MED ORDER — ENOXAPARIN SODIUM 80 MG/0.8ML ~~LOC~~ SOLN
80.0000 mg | SUBCUTANEOUS | Status: DC
  Administered 2014-03-28 – 2014-03-31 (×4): 80 mg via SUBCUTANEOUS
  Filled 2014-03-27 (×4): qty 0.8

## 2014-03-27 MED ORDER — VANCOMYCIN HCL IN DEXTROSE 1-5 GM/200ML-% IV SOLN
1000.0000 mg | Freq: Once | INTRAVENOUS | Status: AC
  Administered 2014-03-27: 1000 mg via INTRAVENOUS
  Filled 2014-03-27: qty 200

## 2014-03-27 MED ORDER — SODIUM POLYSTYRENE SULFONATE 15 GM/60ML PO SUSP
30.0000 g | Freq: Once | ORAL | Status: AC
  Administered 2014-03-27: 30 g
  Filled 2014-03-27: qty 120

## 2014-03-27 MED ORDER — SODIUM CHLORIDE 0.9 % IV SOLN
5.0000 mg/h | INTRAVENOUS | Status: DC
  Administered 2014-03-27: 2 mg/h via INTRAVENOUS
  Filled 2014-03-27 (×2): qty 10

## 2014-03-27 MED ORDER — MIDAZOLAM HCL 50 MG/10ML IJ SOLN
INTRAMUSCULAR | Status: AC
  Filled 2014-03-27: qty 1

## 2014-03-27 MED ORDER — INSULIN ASPART 100 UNIT/ML ~~LOC~~ SOLN
0.0000 [IU] | Freq: Four times a day (QID) | SUBCUTANEOUS | Status: DC
  Administered 2014-03-27: 3 [IU] via SUBCUTANEOUS
  Administered 2014-03-28 (×2): 2 [IU] via SUBCUTANEOUS

## 2014-03-27 MED ORDER — SUCCINYLCHOLINE CHLORIDE 20 MG/ML IJ SOLN
100.0000 mg | Freq: Once | INTRAMUSCULAR | Status: AC
  Administered 2014-03-27: 100 mg via INTRAVENOUS

## 2014-03-27 MED ORDER — ETOMIDATE 2 MG/ML IV SOLN
20.0000 mg | Freq: Once | INTRAVENOUS | Status: AC
  Administered 2014-03-27: 20 mg via INTRAVENOUS

## 2014-03-27 MED ORDER — VANCOMYCIN HCL IN DEXTROSE 1-5 GM/200ML-% IV SOLN
1000.0000 mg | INTRAVENOUS | Status: DC
  Filled 2014-03-27 (×4): qty 200

## 2014-03-27 MED ORDER — SODIUM CHLORIDE 0.9 % IV SOLN: 250.0000 mL | INTRAVENOUS | Status: DC | PRN

## 2014-03-27 MED ORDER — MIDAZOLAM HCL 5 MG/5ML IJ SOLN
INTRAMUSCULAR | Status: AC
  Filled 2014-03-27: qty 5

## 2014-03-27 MED ORDER — ETOMIDATE 2 MG/ML IV SOLN
INTRAVENOUS | Status: AC
  Filled 2014-03-27: qty 20

## 2014-03-27 MED ORDER — SUCCINYLCHOLINE CHLORIDE 20 MG/ML IJ SOLN
INTRAMUSCULAR | Status: AC
  Filled 2014-03-27: qty 1

## 2014-03-27 MED ORDER — NALOXONE HCL 1 MG/ML IJ SOLN
2.0000 mg | Freq: Once | INTRAMUSCULAR | Status: AC
  Administered 2014-03-27: 2 mg via INTRAVENOUS

## 2014-03-27 MED ORDER — PIPERACILLIN-TAZOBACTAM 3.375 G IVPB
3.3750 g | Freq: Once | INTRAVENOUS | Status: AC
  Administered 2014-03-27: 3.375 g via INTRAVENOUS
  Filled 2014-03-27: qty 50

## 2014-03-27 MED ORDER — MIDAZOLAM HCL 2 MG/2ML IJ SOLN
4.0000 mg | Freq: Once | INTRAMUSCULAR | Status: AC
  Administered 2014-03-27: 4 mg via INTRAVENOUS

## 2014-03-27 MED ORDER — ENOXAPARIN SODIUM 30 MG/0.3ML ~~LOC~~ SOLN: 30.0000 mg | SUBCUTANEOUS | Status: DC

## 2014-03-27 MED ORDER — ROCURONIUM BROMIDE 50 MG/5ML IV SOLN
INTRAVENOUS | Status: AC
  Filled 2014-03-27: qty 2

## 2014-03-27 MED ORDER — SODIUM CHLORIDE 0.9 % IV BOLUS (SEPSIS)
1000.0000 mL | Freq: Once | INTRAVENOUS | Status: AC
  Administered 2014-03-27: 1000 mL via INTRAVENOUS

## 2014-03-27 MED ORDER — ONDANSETRON HCL 4 MG/2ML IJ SOLN: 4.0000 mg | Freq: Four times a day (QID) | INTRAMUSCULAR | Status: DC | PRN

## 2014-03-27 MED ORDER — PANTOPRAZOLE SODIUM 40 MG IV SOLR
40.0000 mg | Freq: Every day | INTRAVENOUS | Status: DC
  Administered 2014-03-27 – 2014-03-31 (×5): 40 mg via INTRAVENOUS
  Filled 2014-03-27 (×5): qty 40

## 2014-03-27 MED ORDER — VANCOMYCIN HCL 500 MG IV SOLR
500.0000 mg | Freq: Once | INTRAVENOUS | Status: AC
  Administered 2014-03-27: 500 mg via INTRAVENOUS
  Filled 2014-03-27: qty 500

## 2014-03-27 MED ORDER — SODIUM CHLORIDE 0.9 % IV SOLN
INTRAVENOUS | Status: DC
  Administered 2014-03-27 (×2): via INTRAVENOUS
  Administered 2014-03-28 – 2014-03-29 (×2): 1000 mL via INTRAVENOUS
  Administered 2014-03-29: 50 mL via INTRAVENOUS

## 2014-03-27 MED ORDER — PIPERACILLIN-TAZOBACTAM 3.375 G IVPB
3.3750 g | Freq: Three times a day (TID) | INTRAVENOUS | Status: DC
  Administered 2014-03-27 – 2014-04-01 (×13): 3.375 g via INTRAVENOUS
  Filled 2014-03-27 (×19): qty 50

## 2014-03-27 MED ORDER — CHLORHEXIDINE GLUCONATE 0.12 % MT SOLN
15.0000 mL | Freq: Two times a day (BID) | OROMUCOSAL | Status: DC
  Administered 2014-03-27 – 2014-04-01 (×10): 15 mL via OROMUCOSAL
  Filled 2014-03-27 (×10): qty 15

## 2014-03-27 MED ORDER — LIDOCAINE HCL (CARDIAC) 20 MG/ML IV SOLN
INTRAVENOUS | Status: AC
  Filled 2014-03-27: qty 5

## 2014-03-27 MED ORDER — SODIUM CHLORIDE 0.9 % IV SOLN: INTRAVENOUS | Status: DC

## 2014-03-27 MED ORDER — NALOXONE HCL 1 MG/ML IJ SOLN
INTRAMUSCULAR | Status: AC
  Filled 2014-03-27: qty 2

## 2014-03-27 MED ORDER — CETYLPYRIDINIUM CHLORIDE 0.05 % MT LIQD
7.0000 mL | Freq: Four times a day (QID) | OROMUCOSAL | Status: DC
  Administered 2014-03-28 – 2014-04-01 (×17): 7 mL via OROMUCOSAL

## 2014-03-27 NOTE — ED Notes (Signed)
Per Dr. Wyvonnia Dusky able to " titrate Fentanyl as needed"

## 2014-03-27 NOTE — ED Notes (Addendum)
Voice order per DR. Ray to increase Versed drip to 4mg /hr and 4mg  versed IV push.

## 2014-03-27 NOTE — ED Provider Notes (Signed)
CSN: [id number]     Arrival date & time    History  This chart was scribed for Ezequiel Essex, MD by Chester Holstein, ED Scribe. This patient was seen in room APA01/[facility]01 and the patient's care was started at 12:37 PM.    Chief Complaint  Patient presents with  . Loss of Consciousness    LEVEL 5 CAVEAT Patient is a 68 y.o. female presenting with syncope. The history is limited by the condition of the patient.  Loss of Consciousness   HPI Comments: [name] is a 68 y.o. female brought in by ambulance, who presents to the Emergency Department complaining of LOC 45 min PTA. Per EMS pt was conscious about 30 prior to their arrival. Pt was unresponsive on arrival. Pt is paraplegic with PMHx of DM, anxiety and chronic UTIs.  Per EMS pt is currently being treated for a UTI.  EMS notes pinpoint pupils, and regular pulse. Pt lives at [facility]. Staff notes pt may have taken extra pain medication, states her BP 90/50 en route.  Pt's bowels active on site.    Past Medical History  Diagnosis Date  . Paraplegic spinal paralysis   . Hypertension   . Bipolar 1 disorder   . Thyroid disease   . Diabetes mellitus without complication   . Obesity   . Pyelonephritis   . COPD with asthma   . GERD (gastroesophageal reflux disease)    Past Surgical History  Procedure Laterality Date  . Back surgery    . Abdominal hysterectomy     Family History  Problem Relation Age of Onset  . Heart disease Mother    History  Substance Use Topics  . Smoking status: Former Research scientist (life sciences)  . Smokeless tobacco: Not on file  . Alcohol Use: No   OB History    No data available     Review of Systems  Unable to perform ROS Cardiovascular: Positive for syncope.  Neurological: Positive for syncope.    Level 5 caveat, unresponsive   Allergies  Codeine and Propranolol hcl  Home Medications   Prior to Admission medications   Medication Sig Start Date End Date Taking? Authorizing Provider  acetaminophen  (TYLENOL) 500 MG tablet Take 500 mg by mouth every 6 (six) hours as needed for pain.   Yes Historical Provider, MD  acetaminophen (TYLENOL) 650 MG CR tablet Take 1,300 mg by mouth every 6 (six) hours as needed for pain.   Yes Historical Provider, MD  acidophilus (RISAQUAD) CAPS capsule Take 1 capsule by mouth daily.   Yes Historical Provider, MD  albuterol (PROVENTIL HFA) 108 (90 BASE) MCG/ACT inhaler Inhale 2 puffs into the lungs every 8 (eight) hours as needed (Asthma).    Yes Historical Provider, MD  alendronate (FOSAMAX) 70 MG tablet Take 70 mg by mouth once a week. Take with a full glass of water on an empty stomach.   Yes Historical Provider, MD  alum & mag hydroxide-simeth (MAALOX/MYLANTA) 200-200-20 MG/5ML suspension Take 30 mLs by mouth every 4 (four) hours as needed for indigestion or heartburn.   Yes Historical Provider, MD  aspirin EC 81 MG tablet Take 81 mg by mouth daily.   Yes Historical Provider, MD  baclofen (LIORESAL) 10 MG tablet Take 10 mg by mouth 2 (two) times daily.    Yes Historical Provider, MD  buPROPion (WELLBUTRIN XL) 150 MG 24 hr tablet Take 150 mg by mouth daily.   Yes Historical Provider, MD  buPROPion (WELLBUTRIN XL) 300 MG 24  hr tablet Take 300 mg by mouth daily.   Yes Historical Provider, MD  calcium citrate-vitamin D 500-400 MG-UNIT Chew 1 tablet by mouth 2 (two) times daily.   Yes Historical Provider, MD  carvedilol (COREG) 12.5 MG tablet Take 12.5 mg by mouth 2 (two) times daily with a meal.   Yes Historical Provider, MD  cetirizine (ZYRTEC) 10 MG tablet Take 10 mg by mouth daily.   Yes Historical Provider, MD  clonazePAM (KLONOPIN) 1 MG tablet Take 1 mg by mouth 3 (three) times daily.   Yes Historical Provider, MD  Cranberry 475 MG CAPS Take 1 capsule by mouth daily.   Yes Historical Provider, MD  diclofenac (VOLTAREN) 50 MG EC tablet Take 50 mg by mouth 2 (two) times daily.   Yes Historical Provider, MD  ezetimibe (ZETIA) 10 MG tablet Take 10 mg by mouth daily.    Yes Historical Provider, MD  fentaNYL (DURAGESIC - DOSED MCG/HR) 100 MCG/HR Place 1 patch onto the skin every 3 (three) days.   Yes Historical Provider, MD  fentaNYL (DURAGESIC - DOSED MCG/HR) 25 MCG/HR patch Place 25 mcg onto the skin every 3 (three) days. In addition to the 100 mcg patch   Yes Historical Provider, MD  furosemide (LASIX) 40 MG tablet Take 40 mg by mouth daily.   Yes Historical Provider, MD  gabapentin (NEURONTIN) 300 MG capsule Take 300 mg by mouth 3 (three) times daily.   Yes Historical Provider, MD  Glucosamine-Chondroitin (GLUCOSAMINE CHONDR COMPLEX PO) Take 1 tablet by mouth 3 (three) times daily.   Yes Historical Provider, MD  ipratropium-albuterol (DUONEB) 0.5-2.5 (3) MG/3ML SOLN Take 3 mLs by nebulization every 6 (six) hours as needed (shortness of breath).    Yes Historical Provider, MD  iron polysaccharides (POLY-IRON 150) 150 MG capsule Take 150 mg by mouth daily.   Yes Historical Provider, MD  levothyroxine (SYNTHROID, LEVOTHROID) 200 MCG tablet Take 200 mcg by mouth daily. Takes with Synthroid (Levothyroxine) 28mcg for a total of 240mcg daily at 6:00am   Yes Historical Provider, MD  levothyroxine (SYNTHROID, LEVOTHROID) 25 MCG tablet Take 25 mcg by mouth daily. Takes with Synthroid (Levothyroxine) 261mcg for a total of 273mcg daily at 6:00am   Yes Historical Provider, MD  lisinopril (PRINIVIL,ZESTRIL) 5 MG tablet Take 5 mg by mouth daily.   Yes Historical Provider, MD  LORazepam (ATIVAN) 0.5 MG tablet Take 0.25 mg by mouth every 6 (six) hours as needed for anxiety.   Yes Historical Provider, MD  montelukast (SINGULAIR) 10 MG tablet Take 10 mg by mouth daily.   Yes Historical Provider, MD  Multiple Vitamin (MULTIVITAMIN WITH MINERALS) TABS Take 1 tablet by mouth daily. DAILY-VITE   Yes Historical Provider, MD  Omega-3 Fatty Acids (SEA-OMEGA PO) Take 1 capsule by mouth 2 (two) times daily.   Yes Historical Provider, MD  omeprazole (PRILOSEC) 20 MG capsule Take 20 mg by  mouth daily.   Yes Historical Provider, MD  oxyCODONE (ROXICODONE) 15 MG immediate release tablet Take 7.5 mg by mouth every 6 (six) hours as needed for pain.  03/17/14  Yes Historical Provider, MD  OxyCODONE HCl 7.5 MG TABA Take 1 tablet by mouth every 6 (six) hours as needed (for pain).   Yes Historical Provider, MD  polyethylene glycol powder (GLYCOLAX/MIRALAX) powder Take 17 g by mouth daily.   Yes Historical Provider, MD  potassium chloride (K-DUR) 10 MEQ tablet Take 10 mEq by mouth daily.   Yes Historical Provider, MD  promethazine (PHENERGAN) 12.5 MG  tablet Take 12.5 mg by mouth every 6 (six) hours as needed for nausea or vomiting.   Yes Historical Provider, MD  pseudoephedrine-guaifenesin (MUCINEX D) 60-600 MG per tablet Take 1 tablet by mouth every 12 (twelve) hours as needed for congestion.   Yes Historical Provider, MD  pyridOXINE (VITAMIN B-6) 100 MG tablet Take 100 mg by mouth 2 (two) times daily.   Yes Historical Provider, MD  rizatriptan (MAXALT) 10 MG tablet Take 10 mg by mouth every 8 (eight) hours as needed for migraine. May repeat in 2 hours if needed   Yes Historical Provider, MD  sulfamethoxazole-trimethoprim (BACTRIM DS,SEPTRA DS) 800-160 MG per tablet Take 1 tablet by mouth daily.   Yes Historical Provider, MD  tiZANidine (ZANAFLEX) 2 MG tablet Take 2 mg by mouth every 6 (six) hours as needed for muscle spasms.   Yes Historical Provider, MD  ciprofloxacin (CIPRO) 500 MG tablet Take 1 tablet (500 mg total) by mouth 2 (two) times daily. Patient not taking: Reported on 03/27/2014 10/25/13   Rosita Fire, MD  vancomycin (VANCOCIN HCL) 125 MG capsule Take 1 capsule (125 mg total) by mouth 4 (four) times daily. Patient not taking: Reported on 03/27/2014 10/25/13   Rosita Fire, MD   BP 122/68 mmHg  Pulse 111  Temp(Src) 97.4 F (36.3 C) (Axillary)  Resp 18  Ht 5\' 10"  (1.778 m)  Wt 346 lb (156.945 kg)  BMI 49.65 kg/m2  SpO2 100% Physical Exam  Constitutional: She appears  well-developed and well-nourished. She appears distressed.  Responding to pain only, grimaces  HENT:  Head: Normocephalic and atraumatic.  Mouth/Throat: Oropharynx is clear and moist. No oropharyngeal exudate.  Eyes: Conjunctivae and EOM are normal. Pupils are equal, round, and reactive to light.  Pupils pinpoint  Neck: Normal range of motion. Neck supple.  Cardiovascular: Normal rate, regular rhythm and normal heart sounds.   No murmur heard. Pulmonary/Chest: Effort normal and breath sounds normal. No respiratory distress. She exhibits no tenderness.  Course rhonchi bilaterally  Abdominal: Soft. There is no tenderness. There is no rebound and no guarding.  Obese abdomen, indwelling Foley in place Extensive erythematous rash to perineum and buttocks  Musculoskeletal: Normal range of motion.  Paraplegia of lower extremities  Neurological:  Minimally responsive to pain, grimaces only, does not move extremities.  Skin: Skin is warm and dry.  Nursing note and vitals reviewed.   ED Course  INTUBATION Date/Time: 03/27/2014 1:26 PM Performed by: Ezequiel Essex Authorized by: Ezequiel Essex Consent: The procedure was performed in an emergent situation. Risks and benefits: risks, benefits and alternatives were discussed Consent given by: patient Patient identity confirmed: provided demographic data and verbally with patient Time out: Immediately prior to procedure a "time out" was called to verify the correct patient, procedure, equipment, support staff and site/side marked as required. Indications: respiratory failure and  airway protection Intubation method: video-assisted Patient status: paralyzed (RSI) Preoxygenation: nonrebreather mask Sedatives: etomidate Paralytic: succinylcholine Laryngoscope size: Mac 4 Tube size: 7.5 mm Tube type: cuffed Number of attempts: 1 Ventilation between attempts: BVM Cricoid pressure: no Cords visualized: yes Post-procedure assessment:  chest rise Breath sounds: equal Cuff inflated: yes ETT to lip: 23 cm Tube secured with: ETT holder Chest x-ray interpreted by me and radiologist. Chest x-ray findings: endotracheal tube in appropriate position Patient tolerance: Patient tolerated the procedure well with no immediate complications   (including critical care time) DIAGNOSTIC STUDIES: Oxygen Saturation is 99% on oxygen, normal by my interpretation.    COORDINATION OF CARE:  1:05 PM Discussed treatment plan with patient at beside, the patient agrees with the plan and has no further questions at this time.   Labs Review Labs Reviewed  CBC WITH DIFFERENTIAL - Abnormal; Notable for the following:    WBC 31.4 (*)    MCH 25.9 (*)    MCHC 29.6 (*)    Platelets 404 (*)    Neutrophils Relative % 87 (*)    Lymphocytes Relative 8 (*)    Neutro Abs 27.3 (*)    Monocytes Absolute 1.6 (*)    All other components within normal limits  COMPREHENSIVE METABOLIC PANEL - Abnormal; Notable for the following:    Potassium 5.6 (*)    Glucose, Bld 201 (*)    BUN 50 (*)    Creatinine, Ser 1.65 (*)    AST 46 (*)    Alkaline Phosphatase 128 (*)    GFR calc non Af Amer 31 (*)    GFR calc Af Amer 36 (*)    All other components within normal limits  PROTIME-INR - Abnormal; Notable for the following:    Prothrombin Time 15.9 (*)    All other components within normal limits  URINALYSIS, ROUTINE W REFLEX MICROSCOPIC - Abnormal; Notable for the following:    Hgb urine dipstick LARGE (*)    Leukocytes, UA SMALL (*)    All other components within normal limits  ACETAMINOPHEN LEVEL - Abnormal; Notable for the following:    Acetaminophen (Tylenol), Serum <10.0 (*)    All other components within normal limits  BLOOD GAS, ARTERIAL - Abnormal; Notable for the following:    pH, Arterial 7.453 (*)    pCO2 arterial 29.9 (*)    pO2, Arterial 464.0 (*)    Acid-base deficit 2.7 (*)    All other components within normal limits  URINE  MICROSCOPIC-ADD ON - Abnormal; Notable for the following:    Squamous Epithelial / LPF FEW (*)    Bacteria, UA FEW (*)    All other components within normal limits  GLUCOSE, CAPILLARY - Abnormal; Notable for the following:    Glucose-Capillary 184 (*)    All other components within normal limits  I-STAT CG4 LACTIC ACID, ED - Abnormal; Notable for the following:    Lactic Acid, Venous 2.81 (*)    All other components within normal limits  CULTURE, BLOOD (ROUTINE X 2)  CULTURE, BLOOD (ROUTINE X 2)  URINE CULTURE  MRSA PCR SCREENING  CLOSTRIDIUM DIFFICILE BY PCR  TROPONIN I  URINE RAPID DRUG SCREEN (HOSP PERFORMED)  ETHANOL  SALICYLATE LEVEL  COMPREHENSIVE METABOLIC PANEL  CBC  BLOOD GAS, ARTERIAL    Imaging Review Ct Head Wo Contrast  03/27/2014   CLINICAL DATA:  Hypoxia; unresponsive  EXAM: CT HEAD WITHOUT CONTRAST  TECHNIQUE: Contiguous axial images were obtained from the base of the skull through the vertex without intravenous contrast.  COMPARISON:  October 22, 2013  FINDINGS: A degree of motion artifact makes this study less than optimal. Ventricles are normal in size and configuration. There is no apparent intracranial mass, hemorrhage, extra-axial fluid collection, or midline shift. No focal gray-white compartment lesions are identified. No acute infarct is apparent. The bony calvarium appears intact. The mastoid air cells are clear. Endotracheal tube is present.  IMPRESSION: Motion artifact makes this study somewhat less than optimal. No intracranial mass or hemorrhage is appreciable. No extra-axial fluid collection is seen. The gray-white compartments appear unremarkable allowing for some degradation due to motion artifact. No acute infarct appreciable on  this study.   Electronically Signed   By: Lowella Grip M.D.   On: 03/27/2014 15:05   Dg Chest Portable 1 View  03/27/2014   CLINICAL DATA:  Loss of consciousness.  Unresponsive.  EXAM: PORTABLE CHEST - 1 VIEW  COMPARISON:   10/22/2013  FINDINGS: The endotracheal tube is 5 cm above carina. The NG tube is coursing down the esophagus into the stomach. Low lung volumes with vascular crowding atelectasis. There is tortuosity of the thoracic aorta.  IMPRESSION: Support apparatus in good position without complicating features.  Low lung volumes with vascular crowding and atelectasis.   Electronically Signed   By: Kalman Jewels M.D.   On: 03/27/2014 13:48     EKG Interpretation   Date/Time:  Thursday March 27 2014 12:42:00 EST Ventricular Rate:  90 PR Interval:  181 QRS Duration: 94 QT Interval:  364 QTC Calculation: 445 R Axis:   11 Text Interpretation:  Sinus rhythm Low voltage, extremity leads No  significant change was found Confirmed by Gabriellah Rabel  MD, Shanica Castellanos ([id number]) on  03/27/2014 1:20:14 PM     12:43 PM Pt is responding to painful stimuli only.  12:45 PM Pt given naloxone injection.  12:59 PM No change noted.  1:00 PM Pt given naloxone injection.    MDM   Final diagnoses:  Acute respiratory failure, unspecified whether with hypoxia or hypercapnia  Sepsis, due to unspecified organism  Overdose, undetermined intent, initial encounter   Patient from nursing home unresponsive. Concern for overdose of pain medication. Narcan given without response by EMS intranasally. Patient with history of paraplegia and indwelling Foley. Otherwise independent with ADLs by report.  CBG normal. Patient responds to sternal rub minimally does not follow commands does not open eyes. Additional Narcan given without response.  Emergency contact numbers have no answer. Patient for code by records. Patient intubated due to decreased mental status and inability to protect airway.  Intubated as above. CXR clear.  Labs with WBC 31, AKI, lactate 2.8, K 5.2 CT head negative.  IVF, antibiotics, cultures. Empiric treatment for probable sepsis from UTI.  Suspect element of sedation from medications as well. HR 100-120s.  BP  088-110 systolic. D/w Dr. Halford Chessman who feels patient can stay at [facility]. D/w Dr. Anastasio Champion who will admit to ICU.  CRITICAL CARE Performed by: Ezequiel Essex Total critical care time: 60 Critical care time was exclusive of separately billable procedures and treating other patients. Critical care was necessary to treat or prevent imminent or life-threatening deterioration. Critical care was time spent personally by me on the following activities: development of treatment plan with patient and/or surrogate as well as nursing, discussions with consultants, evaluation of patient's response to treatment, examination of patient, obtaining history from patient or surrogate, ordering and performing treatments and interventions, ordering and review of laboratory studies, ordering and review of radiographic studies, pulse oximetry and re-evaluation of patient's condition.   I personally performed the services described in this documentation, which was scribed in my presence. The recorded information has been reviewed and is accurate.   Ezequiel Essex, MD 03/27/14 [id number]

## 2014-03-27 NOTE — H&P (Signed)
[facility] History and Physical  [name]:[id number] DOB: 06/22/1945 DOA: 03/27/2014  Referring physician: ER PCP: Rosita Fire, MD   Chief Complaint: Altered mental status  HPI: [name] is a 68 y.o. female  This is a 68 year old lady, who is paraplegic, who presented with altered mental status which occurred rather rapidly. She was apparently in her usual state only hour before brought in by the emergency medical services and then became rapidly unresponsive. She was rapidly intubated and is on a ventilator for airway protection. It was felt that she may have had some sort of overdose on her pain medications. She was hypotensive. Patient cannot give me any history as she is currently on a ventilator. She is sedated. She was noted to have myoclonic jerking movements after being given several doses of Narcan to try and reverse the effect of opioids. Urine is noted to be consistent with UTI. She is now being admitted for further management.   Review of Systems:  Patient unable to give me a review of systems secondary to her altered mental status.  Past Medical History  Diagnosis Date  . Paraplegic spinal paralysis   . Hypertension   . Bipolar 1 disorder   . Thyroid disease   . Diabetes mellitus without complication   . Obesity   . Pyelonephritis   . COPD with asthma   . GERD (gastroesophageal reflux disease)    Past Surgical History  Procedure Laterality Date  . Back surgery    . Abdominal hysterectomy     Social History:  reports that she has quit smoking. She does not have any smokeless tobacco history on file. She reports that she does not drink alcohol or use illicit drugs.  Allergies  Allergen Reactions  . Codeine   . Propranolol Hcl     Family History  Problem Relation Age of Onset  . Heart disease Mother      Prior to Admission medications   Medication Sig Start Date End Date Taking? Authorizing Provider  acetaminophen (TYLENOL) 500 MG  tablet Take 500 mg by mouth every 6 (six) hours as needed for pain.   Yes Historical Provider, MD  acetaminophen (TYLENOL) 650 MG CR tablet Take 1,300 mg by mouth every 6 (six) hours as needed for pain.   Yes Historical Provider, MD  acidophilus (RISAQUAD) CAPS capsule Take 1 capsule by mouth daily.   Yes Historical Provider, MD  albuterol (PROVENTIL HFA) 108 (90 BASE) MCG/ACT inhaler Inhale 2 puffs into the lungs every 8 (eight) hours as needed (Asthma).    Yes Historical Provider, MD  alendronate (FOSAMAX) 70 MG tablet Take 70 mg by mouth once a week. Take with a full glass of water on an empty stomach.   Yes Historical Provider, MD  alum & mag hydroxide-simeth (MAALOX/MYLANTA) 200-200-20 MG/5ML suspension Take 30 mLs by mouth every 4 (four) hours as needed for indigestion or heartburn.   Yes Historical Provider, MD  aspirin EC 81 MG tablet Take 81 mg by mouth daily.   Yes Historical Provider, MD  baclofen (LIORESAL) 10 MG tablet Take 10 mg by mouth 2 (two) times daily.    Yes Historical Provider, MD  buPROPion (WELLBUTRIN XL) 150 MG 24 hr tablet Take 150 mg by mouth daily.   Yes Historical Provider, MD  buPROPion (WELLBUTRIN XL) 300 MG 24 hr tablet Take 300 mg by mouth daily.   Yes Historical Provider, MD  calcium citrate-vitamin D 500-400 MG-UNIT Chew 1 tablet by mouth 2 (two)  times daily.   Yes Historical Provider, MD  carvedilol (COREG) 12.5 MG tablet Take 12.5 mg by mouth 2 (two) times daily with a meal.   Yes Historical Provider, MD  cetirizine (ZYRTEC) 10 MG tablet Take 10 mg by mouth daily.   Yes Historical Provider, MD  clonazePAM (KLONOPIN) 1 MG tablet Take 1 mg by mouth 3 (three) times daily.   Yes Historical Provider, MD  Cranberry 475 MG CAPS Take 1 capsule by mouth daily.   Yes Historical Provider, MD  diclofenac (VOLTAREN) 50 MG EC tablet Take 50 mg by mouth 2 (two) times daily.   Yes Historical Provider, MD  ezetimibe (ZETIA) 10 MG tablet Take 10 mg by mouth daily.   Yes Historical  Provider, MD  fentaNYL (DURAGESIC - DOSED MCG/HR) 100 MCG/HR Place 1 patch onto the skin every 3 (three) days.   Yes Historical Provider, MD  fentaNYL (DURAGESIC - DOSED MCG/HR) 25 MCG/HR patch Place 25 mcg onto the skin every 3 (three) days. In addition to the 100 mcg patch   Yes Historical Provider, MD  furosemide (LASIX) 40 MG tablet Take 40 mg by mouth daily.   Yes Historical Provider, MD  gabapentin (NEURONTIN) 300 MG capsule Take 300 mg by mouth 3 (three) times daily.   Yes Historical Provider, MD  Glucosamine-Chondroitin (GLUCOSAMINE CHONDR COMPLEX PO) Take 1 tablet by mouth 3 (three) times daily.   Yes Historical Provider, MD  ipratropium-albuterol (DUONEB) 0.5-2.5 (3) MG/3ML SOLN Take 3 mLs by nebulization every 6 (six) hours as needed (shortness of breath).    Yes Historical Provider, MD  iron polysaccharides (POLY-IRON 150) 150 MG capsule Take 150 mg by mouth daily.   Yes Historical Provider, MD  levothyroxine (SYNTHROID, LEVOTHROID) 200 MCG tablet Take 200 mcg by mouth daily. Takes with Synthroid (Levothyroxine) 9mcg for a total of 252mcg daily at 6:00am   Yes Historical Provider, MD  levothyroxine (SYNTHROID, LEVOTHROID) 25 MCG tablet Take 25 mcg by mouth daily. Takes with Synthroid (Levothyroxine) 227mcg for a total of 226mcg daily at 6:00am   Yes Historical Provider, MD  lisinopril (PRINIVIL,ZESTRIL) 5 MG tablet Take 5 mg by mouth daily.   Yes Historical Provider, MD  LORazepam (ATIVAN) 0.5 MG tablet Take 0.25 mg by mouth every 6 (six) hours as needed for anxiety.   Yes Historical Provider, MD  montelukast (SINGULAIR) 10 MG tablet Take 10 mg by mouth daily.   Yes Historical Provider, MD  Multiple Vitamin (MULTIVITAMIN WITH MINERALS) TABS Take 1 tablet by mouth daily. DAILY-VITE   Yes Historical Provider, MD  Omega-3 Fatty Acids (SEA-OMEGA PO) Take 1 capsule by mouth 2 (two) times daily.   Yes Historical Provider, MD  omeprazole (PRILOSEC) 20 MG capsule Take 20 mg by mouth daily.   Yes  Historical Provider, MD  oxyCODONE (ROXICODONE) 15 MG immediate release tablet Take 7.5 mg by mouth every 6 (six) hours as needed for pain.  03/17/14  Yes Historical Provider, MD  OxyCODONE HCl 7.5 MG TABA Take 1 tablet by mouth every 6 (six) hours as needed (for pain).   Yes Historical Provider, MD  polyethylene glycol powder (GLYCOLAX/MIRALAX) powder Take 17 g by mouth daily.   Yes Historical Provider, MD  potassium chloride (K-DUR) 10 MEQ tablet Take 10 mEq by mouth daily.   Yes Historical Provider, MD  promethazine (PHENERGAN) 12.5 MG tablet Take 12.5 mg by mouth every 6 (six) hours as needed for nausea or vomiting.   Yes Historical Provider, MD  pseudoephedrine-guaifenesin [facility] D) 60-600  MG per tablet Take 1 tablet by mouth every 12 (twelve) hours as needed for congestion.   Yes Historical Provider, MD  pyridOXINE (VITAMIN B-6) 100 MG tablet Take 100 mg by mouth 2 (two) times daily.   Yes Historical Provider, MD  rizatriptan (MAXALT) 10 MG tablet Take 10 mg by mouth every 8 (eight) hours as needed for migraine. May repeat in 2 hours if needed   Yes Historical Provider, MD  sulfamethoxazole-trimethoprim (BACTRIM DS,SEPTRA DS) 800-160 MG per tablet Take 1 tablet by mouth daily.   Yes Historical Provider, MD  tiZANidine (ZANAFLEX) 2 MG tablet Take 2 mg by mouth every 6 (six) hours as needed for muscle spasms.   Yes Historical Provider, MD  ciprofloxacin (CIPRO) 500 MG tablet Take 1 tablet (500 mg total) by mouth 2 (two) times daily. Patient not taking: Reported on 03/27/2014 10/25/13   Rosita Fire, MD  vancomycin (VANCOCIN HCL) 125 MG capsule Take 1 capsule (125 mg total) by mouth 4 (four) times daily. Patient not taking: Reported on 03/27/2014 10/25/13   Rosita Fire, MD   Physical Exam: Filed Vitals:   03/27/14 1400 03/27/14 1430 03/27/14 1500 03/27/14 1515  BP: 100/78 118/77 113/96 122/68  Pulse: 96 112 108 111  Temp:      TempSrc:      Resp: 18 20 13 18   SpO2: 100% 100% 100% 100%      Wt Readings from Last 3 Encounters:  10/22/13 156.945 kg (346 lb)  10/11/12 156.945 kg (346 lb)  05/16/07 111.585 kg (246 lb)    General:  Intubated and on mechanical ventilation. She looks clinically dehydrated. She is hemodynamically stable. She is having intermittent myoclonic jerks. Eyes: PERRL, normal lids, irises & conjunctiva ENT: grossly normal hearing, lips & tongue Neck: no LAD, masses or thyromegaly Cardiovascular: RRR, no m/r/g. No LE edema. Telemetry: SR, no arrhythmias  Respiratory: CTA bilaterally, no w/r/r. Normal respiratory effort. Abdomen: soft, ntnd Skin: She apparently has skin breakdown in her lower back and posterior thigh area-I have not examined this at the present time due to difficulty moving her but this is been noted by nursing staff. Musculoskeletal: grossly normal tone BUE/BLE Psychiatric: Not assessed. Neurologic: grossly non-focal. myoclonic jerking movements.           Labs on Admission:  Basic Metabolic Panel:  Recent Labs Lab 03/27/14 1250  NA 138  K 5.6*  CL 102  CO2 26  GLUCOSE 201*  BUN 50*  CREATININE 1.65*  CALCIUM 8.9   Liver Function Tests:  Recent Labs Lab 03/27/14 1250  AST 46*  ALT 31  ALKPHOS 128*  BILITOT 0.6  PROT 7.6  ALBUMIN 3.7   No results for input(s): LIPASE, AMYLASE in the last 168 hours. No results for input(s): AMMONIA in the last 168 hours. CBC:  Recent Labs Lab 03/27/14 1250  WBC 31.4*  NEUTROABS 27.3*  HGB 12.3  HCT 41.5  MCV 87.4  PLT 404*   Cardiac Enzymes:  Recent Labs Lab 03/27/14 1250  TROPONINI <0.03    BNP (last 3 results) No results for input(s): PROBNP in the last 8760 hours. CBG: No results for input(s): GLUCAP in the last 168 hours.  Radiological Exams on Admission: Ct Head Wo Contrast  03/27/2014   CLINICAL DATA:  Hypoxia; unresponsive  EXAM: CT HEAD WITHOUT CONTRAST  TECHNIQUE: Contiguous axial images were obtained from the base of the skull through the vertex  without intravenous contrast.  COMPARISON:  October 22, 2013  FINDINGS: A degree  of motion artifact makes this study less than optimal. Ventricles are normal in size and configuration. There is no apparent intracranial mass, hemorrhage, extra-axial fluid collection, or midline shift. No focal gray-white compartment lesions are identified. No acute infarct is apparent. The bony calvarium appears intact. The mastoid air cells are clear. Endotracheal tube is present.  IMPRESSION: Motion artifact makes this study somewhat less than optimal. No intracranial mass or hemorrhage is appreciable. No extra-axial fluid collection is seen. The gray-white compartments appear unremarkable allowing for some degradation due to motion artifact. No acute infarct appreciable on this study.   Electronically Signed   By: Lowella Grip M.D.   On: 03/27/2014 15:05   Dg Chest Portable 1 View  03/27/2014   CLINICAL DATA:  Loss of consciousness.  Unresponsive.  EXAM: PORTABLE CHEST - 1 VIEW  COMPARISON:  10/22/2013  FINDINGS: The endotracheal tube is 5 cm above carina. The NG tube is coursing down the esophagus into the stomach. Low lung volumes with vascular crowding atelectasis. There is tortuosity of the thoracic aorta.  IMPRESSION: Support apparatus in good position without complicating features.  Low lung volumes with vascular crowding and atelectasis.   Electronically Signed   By: Kalman Jewels M.D.   On: 03/27/2014 13:48    EKG: Independently reviewed. Normal sinus rhythm without any acute ST-T wave changes.  Assessment/Plan   1. Altered mental status-I think this is a combination of acute renal failure/dehydration compounding excessive opioid ingestion together with UTI. We will treat her with aggressive intravenous fluids and broad-spectrum antibiotics. She'll be treated in intensive care unit. 2. Sepsis secondary to UTI. Treat with antibiotics. 3. Acute respiratory failure-she is really not been able to maintain  airway and she has been placed on a ventilator. We will ask pulmonology to manage the ventilator. 4. Hypertension, stable. 5. Acute renal failure/dehydration-she will need IV fluids. 6. Paraplegic, chronic. 7. Diabetes-monitor and sliding scale of insulin.  Further recommendations will depend on patient's hospital progress.  Code Status: Full code DVT Prophylaxis: Lovenox. Family Communication: No family available at the present time.  Disposition Plan: Depending on progress.   Time spent: 60 minutes.  Doree Albee [facility] Pager [phone].

## 2014-03-27 NOTE — Progress Notes (Signed)
ANTIBIOTIC CONSULT NOTE - INITIAL  Pharmacy Consult for Vancomycin and Zosyn Indication: sepsis  Allergies  Allergen Reactions  . Codeine   . Propranolol Hcl    Patient Measurements: Height: 5\' 10"  (177.8 cm) Weight: (!) 346 lb (156.945 kg) IBW/kg (Calculated) : 68.5  Vital Signs: Temp: 98.7 F (37.1 C) (12/31 1313) Temp Source: Rectal (12/31 1313) BP: 122/68 mmHg (12/31 1515) Pulse Rate: 111 (12/31 1515) Intake/Output from previous day:   Intake/Output from this shift: Total I/O In: -  Out: 200 [Urine:200]  Labs:  Recent Labs  03/27/14 1250  WBC 31.4*  HGB 12.3  PLT 404*  CREATININE 1.65*   Estimated Creatinine Clearance: 55 mL/min (by C-G formula based on Cr of 1.65). No results for input(s): VANCOTROUGH, VANCOPEAK, VANCORANDOM, GENTTROUGH, GENTPEAK, GENTRANDOM, TOBRATROUGH, TOBRAPEAK, TOBRARND, AMIKACINPEAK, AMIKACINTROU, AMIKACIN in the last 72 hours.   Microbiology: No results found for this or any previous visit (from the past 720 hour(s)).  Medical History: Past Medical History  Diagnosis Date  . Paraplegic spinal paralysis   . Hypertension   . Bipolar 1 disorder   . Thyroid disease   . Diabetes mellitus without complication   . Obesity   . Pyelonephritis   . COPD with asthma   . GERD (gastroesophageal reflux disease)    Anti-infectives    Start     Dose/Rate Route Frequency Ordered Stop   03/28/14 1000  vancomycin (VANCOCIN) IVPB 1000 mg/200 mL premix     1,000 mg200 mL/hr over 60 Minutes Intravenous Every 24 hours 03/27/14 1632     03/27/14 2200  piperacillin-tazobactam (ZOSYN) IVPB 3.375 g     3.375 g12.5 mL/hr over 240 Minutes Intravenous Every 8 hours 03/27/14 1557     03/27/14 1430  vancomycin (VANCOCIN) IVPB 1000 mg/200 mL premix     1,000 mg200 mL/hr over 60 Minutes Intravenous  Once 03/27/14 1419     03/27/14 1430  piperacillin-tazobactam (ZOSYN) IVPB 3.375 g     3.375 g12.5 mL/hr over 240 Minutes Intravenous  Once 03/27/14 1419  03/27/14 1603     Assessment: 68yo obese female who is paraplegic.  Admitted for LOC and suspected sepsis.  SCr elevated.  Normalized ClCr ~35-40 Pt received Zosyn 3.375gm and Vancomycin 1gm on admission  Goal of Therapy:  Vancomycin trough level 15-20 mcg/ml Eradicate infection.  Plan:  Zosyn 3.375gm IV q8h, each dose over 4 hrs Vancomycin 1500mg  IV total today (additional 500mg ) then Vancomycin 1000mg  IV q24hrs Check trough at steady state F/U SCr and renal fxn, adjust dosing if needed Monitor labs, renal fxn, and cultures  Hart Robinsons A 03/27/2014,4:33 PM

## 2014-03-27 NOTE — ED Notes (Signed)
Pt had large amount of BM. Pt cleaned total of 3 times. Lines changed. Pt currently on ventilator at this time. Pt starting to gag with tube. Orders received for sedation.

## 2014-03-27 NOTE — Progress Notes (Signed)
emD new arrival eval  Patient Active Problem List   Diagnosis Date Noted  . Respiratory failure 03/27/2014  . Acute respiratory failure 03/27/2014  . UTI (lower urinary tract infection) 10/22/2013  . Pyelonephritis 10/22/2013  . Sepsis 10/22/2013  . Leukocytosis 10/22/2013  . Hyponatremia 10/22/2013  . Encephalopathy acute 10/22/2013  . Obesity 10/22/2013  . Hypothyroidism 10/22/2013  . Sepsis secondary to UTI 10/22/2013  . Paraplegic spinal paralysis   . Hypertension   . Bipolar 1 disorder   . Thyroid disease   . Diabetes mellitus without complication   . GERD (gastroesophageal reflux disease)   . SHOULDER PAIN 11/19/2007  . HAND, ARTHRITIS, DEGEN./OSTEO 09/03/2007  . 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 CARPAL TUNNEL SYNDROME 05/16/2007    Camera exam Filed Vitals:   03/27/14 1515 03/27/14 1600 03/27/14 1758 03/27/14 2000  BP: 122/68   117/68  Pulse: 111     Temp:   97.4 F (36.3 C) 99.3 F (37.4 C)  TempSrc:   Axillary Axillary  Resp: 18   14  Height:  5\' 10"  (1.778 m)    Weight:  156.945 kg (346 lb)    SpO2: 100%       Exam  - synch with vent  - no agitation - looks stable   LABS PULMONARY  Recent Labs Lab 03/27/14 1434  PHART 7.453*  PCO2ART 29.9*  PO2ART 464.0*  HCO3 20.6  TCO2 18.0  O2SAT 97.7    CBC  Recent Labs Lab 03/27/14 1250  HGB 12.3  HCT 41.5  WBC 31.4*  PLT 404*    COAGULATION  Recent Labs Lab 03/27/14 1250  INR 1.26    CARDIAC   Recent Labs Lab 03/27/14 1250  TROPONINI <0.03   No results for input(s): PROBNP in the last 168 hours.   CHEMISTRY  Recent Labs Lab 03/27/14 1250  NA 138  K 5.6*  CL 102  CO2 26  GLUCOSE 201*  BUN 50*  CREATININE 1.65*  CALCIUM 8.9   Estimated Creatinine Clearance: 55 mL/min (by C-G formula based on Cr of 1.65).   LIVER  Recent Labs Lab 03/27/14 1250  AST 46*  ALT 31  ALKPHOS 128*  BILITOT 0.6  PROT 7.6  ALBUMIN 3.7  INR 1.26     INFECTIOUS  Recent Labs Lab  03/27/14 1435  LATICACIDVEN 2.81*     ENDOCRINE CBG (last 3)   Recent Labs  03/27/14 1249 03/27/14 1705  GLUCAP 184* 169*         IMAGING x48h Ct Head Wo Contrast  03/27/2014   CLINICAL DATA:  Hypoxia; unresponsive  EXAM: CT HEAD WITHOUT CONTRAST  TECHNIQUE: Contiguous axial images were obtained from the base of the skull through the vertex without intravenous contrast.  COMPARISON:  October 22, 2013  FINDINGS: A degree of motion artifact makes this study less than optimal. Ventricles are normal in size and configuration. There is no apparent intracranial mass, hemorrhage, extra-axial fluid collection, or midline shift. No focal gray-white compartment lesions are identified. No acute infarct is apparent. The bony calvarium appears intact. The mastoid air cells are clear. Endotracheal tube is present.  IMPRESSION: Motion artifact makes this study somewhat less than optimal. No intracranial mass or hemorrhage is appreciable. No extra-axial fluid collection is seen. The gray-white compartments appear unremarkable allowing for some degradation due to motion artifact. No acute infarct appreciable on this study.   Electronically Signed   By: Lowella Grip M.D.   On: 03/27/2014 15:05   Dg Chest Portable  1 View  03/27/2014   CLINICAL DATA:  Loss of consciousness.  Unresponsive.  EXAM: PORTABLE CHEST - 1 VIEW  COMPARISON:  10/22/2013  FINDINGS: The endotracheal tube is 5 cm above carina. The NG tube is coursing down the esophagus into the stomach. Low lung volumes with vascular crowding atelectasis. There is tortuosity of the thoracic aorta.  IMPRESSION: Support apparatus in good position without complicating features.  Low lung volumes with vascular crowding and atelectasis.   Electronically Signed   By: Kalman Jewels M.D.   On: 03/27/2014 13:48   A)   ICD-9-CM ICD-10-CM   1. Acute respiratory failure, unspecified whether with hypoxia or hypercapnia 518.81 J96.00   2. Altered mental  status 780.97 R41.82 DG Chest Portable 1 View     DG Chest Portable 1 View     CT Head Wo Contrast     CT Head Wo Contrast  3. Sepsis, due to unspecified organism 038.9 A41.9    995.91    4. Overdose, undetermined intent, initial encounter 977.9 T50.904A    E980.5      P No eICU intervention at the moment   Dr. Brand Males, M.D., [facility].C.P Pulmonary and Critical Care Medicine Staff Physician [facility] Pager: [phone], If no answer or between  15:00h - 7:00h: call [phone]  03/27/2014 9:47 PM

## 2014-03-27 NOTE — ED Notes (Signed)
Per Dr. Wyvonnia Dusky to intubate pt. Respiratory at [facility]sd.

## 2014-03-27 NOTE — ED Notes (Signed)
Daughter here and spoke with Dr. Wyvonnia Dusky

## 2014-03-27 NOTE — ED Notes (Signed)
76F foley from facility removed and new foley inserted.

## 2014-03-27 NOTE — ED Notes (Signed)
Pt was found by staff at [facility], pt is not arousable. EMS was called. Pt is not responsive on admission. Staff questioned to EMS that pt may have taken extra pain medication.

## 2014-03-27 NOTE — ED Notes (Signed)
Pt still restless and jerky motion with arms. Will consult MD for increase of sedation

## 2014-03-27 NOTE — ED Notes (Signed)
ETT tubed placed and placement checked. 23cc at lip rt. Positive color change.

## 2014-03-27 NOTE — ED Notes (Addendum)
Per Dr. Sharren Bridge increase versed to 5mg .

## 2014-03-28 ENCOUNTER — Inpatient Hospital Stay (HOSPITAL_COMMUNITY): Payer: PRIVATE HEALTH INSURANCE

## 2014-03-28 DIAGNOSIS — R6521 Severe sepsis with septic shock: Secondary | ICD-10-CM

## 2014-03-28 DIAGNOSIS — A419 Sepsis, unspecified organism: Secondary | ICD-10-CM | POA: Diagnosis present

## 2014-03-28 DIAGNOSIS — F329 Major depressive disorder, single episode, unspecified: Secondary | ICD-10-CM | POA: Diagnosis present

## 2014-03-28 DIAGNOSIS — N179 Acute kidney failure, unspecified: Secondary | ICD-10-CM | POA: Diagnosis present

## 2014-03-28 DIAGNOSIS — F32A Depression, unspecified: Secondary | ICD-10-CM | POA: Diagnosis present

## 2014-03-28 DIAGNOSIS — L89150 Pressure ulcer of sacral region, unstageable: Secondary | ICD-10-CM | POA: Diagnosis present

## 2014-03-28 DIAGNOSIS — E039 Hypothyroidism, unspecified: Secondary | ICD-10-CM | POA: Diagnosis present

## 2014-03-28 DIAGNOSIS — R197 Diarrhea, unspecified: Secondary | ICD-10-CM | POA: Diagnosis present

## 2014-03-28 LAB — BLOOD GAS, ARTERIAL
ACID-BASE DEFICIT: 1.4 mmol/L (ref 0.0–2.0)
Acid-Base Excess: 0.7 mmol/L (ref 0.0–2.0)
BICARBONATE: 22.1 meq/L (ref 20.0–24.0)
Drawn by: 22223
FIO2: 40 %
MECHVT: 500 mL
O2 SAT: 96.1 %
PCO2 ART: 32.5 mmHg — AB (ref 35.0–45.0)
PEEP/CPAP: 5 cmH2O
RATE: 14 resp/min
TCO2: 19.2 mmol/L (ref 0–100)
pH, Arterial: 7.447 (ref 7.350–7.450)
pO2, Arterial: 110 mmHg — ABNORMAL HIGH (ref 80.0–100.0)

## 2014-03-28 LAB — CBC
HEMATOCRIT: 40.8 % (ref 36.0–46.0)
Hemoglobin: 12.6 g/dL (ref 12.0–15.0)
MCH: 26.9 pg (ref 26.0–34.0)
MCHC: 30.9 g/dL (ref 30.0–36.0)
MCV: 87 fL (ref 78.0–100.0)
PLATELETS: 259 10*3/uL (ref 150–400)
RBC: 4.69 MIL/uL (ref 3.87–5.11)
RDW: 14.7 % (ref 11.5–15.5)
WBC: 19.3 10*3/uL — AB (ref 4.0–10.5)

## 2014-03-28 LAB — GLUCOSE, CAPILLARY
GLUCOSE-CAPILLARY: 113 mg/dL — AB (ref 70–99)
GLUCOSE-CAPILLARY: 134 mg/dL — AB (ref 70–99)
GLUCOSE-CAPILLARY: 132 mg/dL — AB (ref 70–99)
GLUCOSE-CAPILLARY: 128 mg/dL — AB (ref 70–99)
Glucose-Capillary: 110 mg/dL — ABNORMAL HIGH (ref 70–99)
Glucose-Capillary: 106 mg/dL — ABNORMAL HIGH (ref 70–99)

## 2014-03-28 LAB — COMPREHENSIVE METABOLIC PANEL
ALK PHOS: 79 U/L (ref 39–117)
ALT: 21 U/L (ref 0–35)
AST: 35 U/L (ref 0–37)
Albumin: 2.9 g/dL — ABNORMAL LOW (ref 3.5–5.2)
Anion gap: 13 (ref 5–15)
BUN: 40 mg/dL — ABNORMAL HIGH (ref 6–23)
CHLORIDE: 111 meq/L (ref 96–112)
CO2: 18 mmol/L — ABNORMAL LOW (ref 19–32)
Calcium: 7.7 mg/dL — ABNORMAL LOW (ref 8.4–10.5)
Creatinine, Ser: 0.92 mg/dL (ref 0.50–1.10)
GFR calc Af Amer: 74 mL/min — ABNORMAL LOW (ref >90)
GFR calc non Af Amer: 63 mL/min — ABNORMAL LOW (ref >90)
Glucose, Bld: 130 mg/dL — ABNORMAL HIGH (ref 70–99)
Potassium: 4.7 mmol/L (ref 3.5–5.1)
Sodium: 142 mmol/L (ref 135–145)
Total Bilirubin: 0.6 mg/dL (ref 0.3–1.2)
Total Protein: 6.1 g/dL (ref 6.0–8.3)

## 2014-03-28 MED ORDER — BACLOFEN 10 MG PO TABS
10.0000 mg | ORAL_TABLET | Freq: Two times a day (BID) | ORAL | Status: DC
  Administered 2014-03-28 – 2014-04-01 (×8): 10 mg via ORAL
  Filled 2014-03-28 (×13): qty 1

## 2014-03-28 MED ORDER — BUPROPION HCL ER (XL) 300 MG PO TB24
300.0000 mg | ORAL_TABLET | Freq: Every day | ORAL | Status: DC
  Administered 2014-03-29 – 2014-04-01 (×4): 300 mg via ORAL
  Filled 2014-03-28 (×6): qty 1

## 2014-03-28 MED ORDER — MUPIROCIN 2 % EX OINT
1.0000 "application " | TOPICAL_OINTMENT | Freq: Two times a day (BID) | CUTANEOUS | Status: DC
  Administered 2014-03-28 – 2014-04-01 (×8): 1 via NASAL
  Filled 2014-03-28 (×2): qty 22

## 2014-03-28 MED ORDER — BUPROPION HCL ER (XL) 150 MG PO TB24
ORAL_TABLET | ORAL | Status: AC
Start: 2014-03-28 — End: 2014-03-28
  Filled 2014-03-28: qty 3

## 2014-03-28 MED ORDER — LORAZEPAM 2 MG/ML IJ SOLN
2.0000 mg | INTRAMUSCULAR | Status: AC
  Administered 2014-03-28: 2 mg via INTRAMUSCULAR

## 2014-03-28 MED ORDER — VANCOMYCIN HCL IN DEXTROSE 1-5 GM/200ML-% IV SOLN
INTRAVENOUS | Status: AC
  Filled 2014-03-28: qty 200

## 2014-03-28 MED ORDER — VANCOMYCIN HCL 1000 MG IV SOLR
INTRAVENOUS | Status: AC
  Filled 2014-03-28: qty 1000

## 2014-03-28 MED ORDER — LEVOTHYROXINE SODIUM 25 MCG PO TABS
25.0000 ug | ORAL_TABLET | Freq: Every day | ORAL | Status: DC
  Administered 2014-03-29 – 2014-04-01 (×4): 25 ug via ORAL
  Filled 2014-03-28 (×4): qty 1

## 2014-03-28 MED ORDER — INSULIN ASPART 100 UNIT/ML ~~LOC~~ SOLN: 0.0000 [IU] | Freq: Every day | SUBCUTANEOUS | Status: DC

## 2014-03-28 MED ORDER — SODIUM CHLORIDE 0.9 % IJ SOLN: 10.0000 mL | INTRAMUSCULAR | Status: DC | PRN

## 2014-03-28 MED ORDER — INSULIN ASPART 100 UNIT/ML ~~LOC~~ SOLN
0.0000 [IU] | Freq: Three times a day (TID) | SUBCUTANEOUS | Status: DC
  Administered 2014-03-31: 2 [IU] via SUBCUTANEOUS
  Administered 2014-04-01: 1 [IU] via SUBCUTANEOUS

## 2014-03-28 MED ORDER — VANCOMYCIN 50 MG/ML ORAL SOLUTION
125.0000 mg | Freq: Four times a day (QID) | ORAL | Status: DC
  Administered 2014-03-29 (×3): 125 mg via ORAL
  Filled 2014-03-28 (×12): qty 2.5

## 2014-03-28 MED ORDER — ASPIRIN EC 81 MG PO TBEC
81.0000 mg | DELAYED_RELEASE_TABLET | Freq: Every day | ORAL | Status: DC
  Administered 2014-03-28 – 2014-04-01 (×5): 81 mg via ORAL
  Filled 2014-03-28 (×5): qty 1

## 2014-03-28 MED ORDER — CHLORHEXIDINE GLUCONATE CLOTH 2 % EX PADS
6.0000 | MEDICATED_PAD | Freq: Every day | CUTANEOUS | Status: DC
  Administered 2014-03-29 – 2014-04-01 (×3): 6 via TOPICAL

## 2014-03-28 MED ORDER — SODIUM CHLORIDE 0.9 % IJ SOLN
10.0000 mL | Freq: Two times a day (BID) | INTRAMUSCULAR | Status: DC
  Administered 2014-03-28: 30 mL
  Administered 2014-03-29 – 2014-04-01 (×6): 10 mL

## 2014-03-28 MED ORDER — LORAZEPAM 2 MG/ML IJ SOLN
INTRAMUSCULAR | Status: AC
  Filled 2014-03-28: qty 1

## 2014-03-28 MED ORDER — BUPROPION HCL ER (XL) 150 MG PO TB24
150.0000 mg | ORAL_TABLET | Freq: Every day | ORAL | Status: DC
  Administered 2014-03-28 – 2014-04-01 (×3): 150 mg via ORAL
  Filled 2014-03-28 (×6): qty 1

## 2014-03-28 MED ORDER — VANCOMYCIN HCL IN DEXTROSE 1-5 GM/200ML-% IV SOLN
1000.0000 mg | Freq: Three times a day (TID) | INTRAVENOUS | Status: DC
  Administered 2014-03-28 – 2014-03-30 (×6): 1000 mg via INTRAVENOUS
  Filled 2014-03-28 (×10): qty 200

## 2014-03-28 MED ORDER — LORAZEPAM 0.5 MG PO TABS: 2.0000 mg | ORAL_TABLET | ORAL | Status: AC

## 2014-03-28 MED ORDER — LEVOTHYROXINE SODIUM 100 MCG PO TABS
200.0000 ug | ORAL_TABLET | Freq: Every day | ORAL | Status: DC
  Administered 2014-03-29 – 2014-04-01 (×4): 200 ug via ORAL
  Filled 2014-03-28 (×4): qty 2

## 2014-03-28 NOTE — Care Management Utilization Note (Signed)
UR completed 

## 2014-03-28 NOTE — Progress Notes (Signed)
ANTIBIOTIC CONSULT NOTE - follow up  Pharmacy Consult for Vancomycin and Zosyn Indication: sepsis  Allergies  Allergen Reactions  . Codeine   . Propranolol Hcl    Patient Measurements: Height: 5\' 10"  (177.8 cm) Weight: (!) 350 lb 8.5 oz (159 kg) IBW/kg (Calculated) : 68.5  Vital Signs: Temp: 99.5 F (37.5 C) (01/01 0400) Temp Source: Axillary (01/01 0400) BP: 115/54 mmHg (01/01 0700) Pulse Rate: 96 (12/31 2300) Intake/Output from previous day: 12/31 0701 - 01/01 0700 In: 2873.9 [I.V.:1873.9; IV Piggyback:1000] Out: 900 [Urine:800; Emesis/NG output:100] Intake/Output from this shift:    Labs:  Recent Labs  03/27/14 1250 03/28/14 0521  WBC 31.4* 19.3*  HGB 12.3 12.6  PLT 404* 259  CREATININE 1.65* 0.92   Estimated Creatinine Clearance: 99.4 mL/min (by C-G formula based on Cr of 0.92). No results for input(s): VANCOTROUGH, VANCOPEAK, VANCORANDOM, GENTTROUGH, GENTPEAK, GENTRANDOM, TOBRATROUGH, TOBRAPEAK, TOBRARND, AMIKACINPEAK, AMIKACINTROU, AMIKACIN in the last 72 hours.   Microbiology: Recent Results (from the past 720 hour(s))  MRSA PCR Screening     Status: Abnormal   Collection Time: 03/27/14  4:30 PM  Result Value Ref Range Status   MRSA by PCR POSITIVE (A) NEGATIVE Final    Comment:        The GeneXpert MRSA Assay (FDA approved for NASAL specimens only), is one component of a comprehensive MRSA colonization surveillance program. It is not intended to diagnose MRSA infection nor to guide or monitor treatment for MRSA infections. RESULT CALLED TO, READ BACK BY AND VERIFIED WITH: DANIELS,J. AT 1933 ON 03/27/2014 BY BAUGHAM,M.   Clostridium Difficile by PCR     Status: None   Collection Time: 03/27/14  5:00 PM  Result Value Ref Range Status   C difficile by pcr NEGATIVE NEGATIVE Final   Medical History: Past Medical History  Diagnosis Date  . Paraplegic spinal paralysis   . Hypertension   . Bipolar 1 disorder   . Thyroid disease   . Diabetes  mellitus without complication   . Obesity   . Pyelonephritis   . COPD with asthma   . GERD (gastroesophageal reflux disease)    Anti-infectives    Start     Dose/Rate Route Frequency Ordered Stop   03/28/14 1000  vancomycin (VANCOCIN) IVPB 1000 mg/200 mL premix  Status:  Discontinued     1,000 mg200 mL/hr over 60 Minutes Intravenous Every 24 hours 03/27/14 1632 03/28/14 0855   03/28/14 1000  vancomycin (VANCOCIN) IVPB 1000 mg/200 mL premix     1,000 mg200 mL/hr over 60 Minutes Intravenous Every 8 hours 03/28/14 0855     03/27/14 2200  piperacillin-tazobactam (ZOSYN) IVPB 3.375 g     3.375 g12.5 mL/hr over 240 Minutes Intravenous Every 8 hours 03/27/14 1557     03/27/14 1800  vancomycin (VANCOCIN) 500 mg in sodium chloride 0.9 % 100 mL IVPB     500 mg100 mL/hr over 60 Minutes Intravenous  Once 03/27/14 1639 03/27/14 1843   03/27/14 1430  vancomycin (VANCOCIN) IVPB 1000 mg/200 mL premix     1,000 mg200 mL/hr over 60 Minutes Intravenous  Once 03/27/14 1419 03/27/14 1653   03/27/14 1430  piperacillin-tazobactam (ZOSYN) IVPB 3.375 g     3.375 g12.5 mL/hr over 240 Minutes Intravenous  Once 03/27/14 1419 03/27/14 1603     Assessment: 69yo morbidly obese female who is paraplegic.  Admitted for LOC and suspected sepsis.  SCr elevated on admission but has improved with hydration.  Estimated Creatinine Clearance: 99.4 mL/min (by C-G formula  based on Cr of 0.92).  Goal of Therapy:  Vancomycin trough level 15-20 mcg/ml Eradicate infection.  Plan:   Zosyn 3.375gm IV q8h, each dose over 4 hrs  Increase Vancomycin to 1000mg  IV q8hrs  Check trough at steady state  F/U SCr and renal fxn, adjust dosing if needed  Monitor labs, renal fxn, and cultures  Hart Robinsons A 03/28/2014,8:57 AM

## 2014-03-28 NOTE — Progress Notes (Addendum)
MULTIPLE ATTEMPTS TO RESTART IV BY  SEVERAL NURSES  AFTER PT PULLED BOTH IV'S OUT.

## 2014-03-28 NOTE — Procedures (Signed)
Central Venous Catheter Insertion Procedure Note KIERSTAN AUER [id number] Sep 19, 1945  Procedure: Insertion of Central Venous Catheter Indications: Assessment of intravascular volume, Drug and/or fluid administration and Frequent blood sampling  Procedure Details Consent: Risks of procedure as well as the alternatives and risks of each were explained to the (patient/caregiver).  Consent for procedure obtained. Time Out: Verified patient identification, verified procedure, site/side was marked, verified correct patient position, special equipment/implants available, medications/allergies/relevent history reviewed, required imaging and test results available.  Performed  Maximum sterile technique was used including antiseptics, cap, gloves, gown, hand hygiene, mask and sheet. Skin prep: Iodine solution; local anesthetic administered A antimicrobial bonded/coated triple lumen catheter was placed in the right femoral vein due to multiple attempts, no other available access using the Seldinger technique.  Evaluation Blood flow good Complications: No apparent complications Patient did tolerate procedure well.   Naylee Frankowski A 03/28/2014, 7:10 PM

## 2014-03-28 NOTE — Progress Notes (Signed)
Extubated Pt to a 50%VM. Pt's SATS were 100%. RT decreased O2 to 4 LNC and her SATS are still 100%. The Pt was very non compliant and had issues keeping her clam. She is able to talk and answer question appropriately. DR Maryland Pink wrote the extubation order.

## 2014-03-28 NOTE — Progress Notes (Signed)
Pt has severely excoriated red and open areas on the entirety of her Rectum, buttocks, upper posterior leg and popliteal region of bilateral legs. Areas were cleansed and covered in a thin cream, and foam dressings were applied to areas that were open. Flexi-seal was inserted due to constant thin stool.

## 2014-03-28 NOTE — Progress Notes (Signed)
Pt very agitated. She has mits on and restraints and is still moving all around the bed

## 2014-03-28 NOTE — Clinical Social Work Psychosocial (Signed)
Clinical Social Work Department BRIEF PSYCHOSOCIAL ASSESSMENT 03/28/2014  Patient:  [name], [name]     Account Number:  [id number]     Admit date:  03/27/2014  Clinical Social Worker:  Wyatt Haste  Date/Time:  03/28/2014 11:06 AM  Referred by:  CSW  Date Referred:  03/28/2014 Referred for  SNF Placement   Other Referral:   Interview type:  Family Other interview type:   daughter- [name]    PSYCHOSOCIAL DATA Living Status:  FACILITY Admitted from facility:  [facility] Level of care:  [place] Primary support name:  [name] Primary support relationship to patient:  CHILD, ADULT Degree of support available:   limited    CURRENT CONCERNS Current Concerns  Post-Acute Placement   Other Concerns:    SOCIAL WORK ASSESSMENT / PLAN CSW spoke with pt's daughter, [name] on phone. Pt admitted with acute respiratory failure and sepsis secondary to UTI. Pt is currently on ventilator. [name] reports she came to hospital last night. Pt has been a resident at [facility] for almost 7 years. 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 [name] states that she cared for pt for awhile, but then needed to seek placement to meet her needs. [name] has had limited involvement recently due to "problems" for her at home. CSW provided brief support. [name] is interested in pt possibly looking at placement in [place] to be closer to her. CSW asked her if she feels pt would be agreeable to that since she has been at [facility] for so long. [name] indicates she is not sure. CSW agreed to discuss this further with pt next week. Per [name] at [facility], pt is nursing level of care and okay to return. At one point, pt was using electric wheelchair, but [name] reports that recently she has been requesting to stay in bed. As of today, pt has traditional Medicare.   Assessment/plan status:  Psychosocial Support/Ongoing Assessment of Needs Other assessment/ plan:   Information/referral to  community resources:   [facility]    PATIENT'S/FAMILY'S RESPONSE TO PLAN OF CARE: Pt unable to participate in assessment. Daughter is interested in possible [facility] placement which will be explored further next week if pt is interested.       Benay Pike, [place]

## 2014-03-28 NOTE — Progress Notes (Signed)
PROGRESS NOTE  [name]:[id number] DOB: Jun 19, 1945 DOA: 03/27/2014 PCP: [name], MD  HPI/Recap of past 23 hours: 69 year old female with history of hypothyroidism, diabetes mellitus and paraplegia who resides at a skilled nursing facility and was brought in on 12/31 for altered mental status and then became unresponsive. Patient was intubated and placed on the ventilator. She was noted to have a white count of 31 and was hypotensive. Blood cultures were drawn and patient was placed in the ICU.  By the following day, white blood cell count had dropped from 31-19. Patient still somewhat hypotensive, but as the day progressed became more awake today point where she became agitated and removed her IVs. Given her degree of alertness, she was able to be extubated. When I went to talk to her, she stated that no one had been checking on her at her skilled nursing facility allegedly and she been having problems with continued diarrhea. C. difficile PCR was done on admission which was negative, however patient is noted to have foul-smelling very liquidy brown stool currently. She is also noted to have significant decubitus and skin breakdown on her backside. Patient himself actually denies any complaints.  Assessment/Plan: Principal Problem:   Severe sepsis with septic shock with acute respiratory failure and encephalopathy: Initial shock, encephalopathy and respiratory failure resolved. Suspect that the underlying cause given markedly elevated white blood cell count and diarrhea his C. difficile. Patient had been on Cipro in the last month. Repeat C. difficile PCR and have started by mouth vancomycin for completeness sake. Central line to be placed. Active Problems:   Paraplegic spinal paralysis: Continue air mattress   Hypertension: Holding antihypertensives   Diabetes mellitus without complication: Sliding scale, checking A1c  Morbid obesity: Patient is criteria with BMI greater than  40    Decubitus ulcer of sacral region, unstageable: Wound care to see   Diarrhea: As above, checking C. difficile again   Hypothyroid: Resuming Synthroid, checking TSH   Depression: Resuming Wellbutrin Acute renal failure: Secondary to septic shock with hypotension, resolved with IV fluids   Code Status: Full code  Family Communication: Left message with daughter  Disposition Plan: Keep in stepdown to pressure stabilized   Consultants:  Pulmonary  Procedures:  Central line placement done 1/1  Antibiotics:  IV vancomycin 12/31-present  IV Zosyn 12/31-present  by mouth vancomycin 1/1-present   Objective: BP 152/79 mmHg  Pulse 125  Temp(Src) 100.4 F (38 C) (Axillary)  Resp 26  Ht 5\' 10"  (1.778 m)  Wt 159 kg (350 lb 8.5 oz)  BMI 50.30 kg/m2  SpO2 100%  Intake/Output Summary (Last 24 hours) at 03/28/14 1816 Last data filed at 03/28/14 1200  Gross per 24 hour  Intake 3973.86 ml  Output    700 ml  Net 3273.86 ml   Filed Weights   03/27/14 1600 03/28/14 0500  Weight: 156.945 kg (346 lb) 159 kg (350 lb 8.5 oz)    Exam:   General:  Alert and oriented 3, slightly agitated  Cardiovascular: Regular rhythm, tachycardic  Respiratory: Decreased breath sounds throughout secondary to body habitus  Abdomen: Soft, obese, nontender, hypoactive bowel sounds  Musculoskeletal: Trace pitting edema bilaterally  Skin: Back side with unstageable decubitus and breakdown   Data Reviewed: Basic Metabolic Panel:  Recent Labs Lab 03/27/14 1250 03/28/14 0521  NA 138 142  K 5.6* 4.7  CL 102 111  CO2 26 18*  GLUCOSE 201* 130*  BUN 50* 40*  CREATININE 1.65* 0.92  CALCIUM  8.9 7.7*   Liver Function Tests:  Recent Labs Lab 03/27/14 1250 03/28/14 0521  AST 46* 35  ALT 31 21  ALKPHOS 128* 79  BILITOT 0.6 0.6  PROT 7.6 6.1  ALBUMIN 3.7 2.9*   No results for input(s): LIPASE, AMYLASE in the last 168 hours. No results for input(s): AMMONIA in the last 168  hours. CBC:  Recent Labs Lab 03/27/14 1250 03/28/14 0521  WBC 31.4* 19.3*  NEUTROABS 27.3*  --   HGB 12.3 12.6  HCT 41.5 40.8  MCV 87.4 87.0  PLT 404* 259   Cardiac Enzymes:    Recent Labs Lab 03/27/14 1250  TROPONINI <0.03   BNP (last 3 results) No results for input(s): PROBNP in the last 8760 hours. CBG:  Recent Labs Lab 03/28/14 0537 03/28/14 0917 03/28/14 1141 03/28/14 1414 03/28/14 1621  GLUCAP 113* 134* 110* 106* 132*    Recent Results (from the past 240 hour(s))  MRSA PCR Screening     Status: Abnormal   Collection Time: 03/27/14  4:30 PM  Result Value Ref Range Status   MRSA by PCR POSITIVE (A) NEGATIVE Final    Comment:        The GeneXpert MRSA Assay (FDA approved for NASAL specimens only), is one component of a comprehensive MRSA colonization surveillance program. It is not intended to diagnose MRSA infection nor to guide or monitor treatment for MRSA infections. RESULT CALLED TO, READ BACK BY AND VERIFIED WITH: DANIELS,J. AT 1933 ON 03/27/2014 BY BAUGHAM,M.   Clostridium Difficile by PCR     Status: None   Collection Time: 03/27/14  5:00 PM  Result Value Ref Range Status   C difficile by pcr NEGATIVE NEGATIVE Final     Studies: Ct Head Wo Contrast  03/27/2014   CLINICAL DATA:  Hypoxia; unresponsive  EXAM: CT HEAD WITHOUT CONTRAST  TECHNIQUE: Contiguous axial images were obtained from the base of the skull through the vertex without intravenous contrast.  COMPARISON:  October 22, 2013  FINDINGS: A degree of motion artifact makes this study less than optimal. Ventricles are normal in size and configuration. There is no apparent intracranial mass, hemorrhage, extra-axial fluid collection, or midline shift. No focal gray-white compartment lesions are identified. No acute infarct is apparent. The bony calvarium appears intact. The mastoid air cells are clear. Endotracheal tube is present.  IMPRESSION: Motion artifact makes this study somewhat less  than optimal. No intracranial mass or hemorrhage is appreciable. No extra-axial fluid collection is seen. The gray-white compartments appear unremarkable allowing for some degradation due to motion artifact. No acute infarct appreciable on this study.   Electronically Signed   By: Lowella Grip M.D.   On: 03/27/2014 15:05   Dg Chest Portable 1 View  03/27/2014   CLINICAL DATA:  Loss of consciousness.  Unresponsive.  EXAM: PORTABLE CHEST - 1 VIEW  COMPARISON:  10/22/2013  FINDINGS: The endotracheal tube is 5 cm above carina. The NG tube is coursing down the esophagus into the stomach. Low lung volumes with vascular crowding atelectasis. There is tortuosity of the thoracic aorta.  IMPRESSION: Support apparatus in good position without complicating features.  Low lung volumes with vascular crowding and atelectasis.   Electronically Signed   By: Kalman Jewels M.D.   On: 03/27/2014 13:48    Scheduled Meds: . antiseptic oral rinse  7 mL Mouth Rinse QID  . aspirin EC  81 mg Oral Daily  . baclofen  10 mg Oral BID  . buPROPion  150  mg Oral Daily  . buPROPion  300 mg Oral Daily  . chlorhexidine  15 mL Mouth Rinse BID  . enoxaparin (LOVENOX) injection  80 mg Subcutaneous Q24H  . insulin aspart  0-15 Units Subcutaneous QID  . levothyroxine  200 mcg Oral Daily  . levothyroxine  25 mcg Oral Daily  . pantoprazole (PROTONIX) IV  40 mg Intravenous QHS  . piperacillin-tazobactam (ZOSYN)  IV  3.375 g Intravenous Q8H  . vancomycin  125 mg Oral 4 times per day  . vancomycin  1,000 mg Intravenous Q8H    Continuous Infusions: . sodium chloride 150 mL/hr at 03/28/14 1200  . fentaNYL infusion INTRAVENOUS 25 mcg/hr (03/28/14 1200)  . midazolam (VERSED) infusion 2 mg/hr (03/28/14 1200)     Time spent: 45 minutes  [facility] Pager [phone]. If 7PM-7AM, please contact night-coverage at [email or web address], password [facility] 03/28/2014, 6:16 PM  LOS: 1 day

## 2014-03-28 NOTE — Clinical Documentation Improvement (Signed)
Presents with Sepsis secondary to UTI; has history of paraplegia and indwelling foley catheter; catheter changed in ED.   Please clarify if you feel the patient has: Sepsis 2/2 UTI 2/2 indwelling foley                 Sepsis 2/2 UTI unrelated to foley catheter                 Other Condition   Thank You, Zoila Shutter ,RN Clinical Documentation Specialist:  [facility]

## 2014-03-28 NOTE — Consult Note (Signed)
Consult requested by: Dr. Legrand Rams Consult requested for respiratory failure:  HPI: This is a 69 year old Caucasian female who is known to be paraplegic and who had respiratory failure from altered mental status. She had apparently been in her usual state of health and then had rapidly progressive alteration in mental status to the point that she became unresponsive. She was intubated and placed on a ventilator for airway protection. She remains intubated now. The history is from the medical record because she is unable to provide any history and there is no family available  Past Medical History  Diagnosis Date  . Paraplegic spinal paralysis   . Hypertension   . Bipolar 1 disorder   . Thyroid disease   . Diabetes mellitus without complication   . Obesity   . Pyelonephritis   . COPD with asthma   . GERD (gastroesophageal reflux disease)      Family History  Problem Relation Age of Onset  . Heart disease Mother      History   Social History  . Marital Status: Single    Spouse Name: N/A    Number of Children: N/A  . Years of Education: N/A   Social History Main Topics  . Smoking status: Former Research scientist (life sciences)  . Smokeless tobacco: None  . Alcohol Use: No  . Drug Use: No  . Sexual Activity: No   Other Topics Concern  . None   Social History Narrative     ROS: Unobtainable    Objective: Vital signs in last 24 hours: Temp:  [97.4 F (36.3 C)-100.1 F (37.8 C)] 99.5 F (37.5 C) (01/01 0400) Pulse Rate:  [89-123] 110 (01/01 1100) Resp:  [8-24] 17 (01/01 1100) BP: (80-143)/(17-96) 113/61 mmHg (01/01 1100) SpO2:  [89 %-100 %] 100 % (01/01 1100) FiO2 (%):  [40 %-100 %] 40 % (01/01 0726) Weight:  [156.945 kg (346 lb)-159 kg (350 lb 8.5 oz)] 159 kg (350 lb 8.5 oz) (01/01 0500) Weight change:  Last BM Date: 03/28/14  Intake/Output from previous day: 12/31 0701 - 01/01 0700 In: 2873.9 [I.V.:1873.9; IV Piggyback:1000] Out: 900 [Urine:800; Emesis/NG output:100]  PHYSICAL  EXAM She is intubated and sedated. She is obese. Her pupils reactand throat are clear. Her neck is supple. Her chest shows rhonchi bilaterally. Her heart is regular without gallop. Her abdomen is soft no masses are felt she does not have peripheral edema. I can't assess her central nervous system any further  Lab Results: Basic Metabolic Panel:  Recent Labs  03/27/14 1250 03/28/14 0521  NA 138 142  K 5.6* 4.7  CL 102 111  CO2 26 18*  GLUCOSE 201* 130*  BUN 50* 40*  CREATININE 1.65* 0.92  CALCIUM 8.9 7.7*   Liver Function Tests:  Recent Labs  03/27/14 1250 03/28/14 0521  AST 46* 35  ALT 31 21  ALKPHOS 128* 79  BILITOT 0.6 0.6  PROT 7.6 6.1  ALBUMIN 3.7 2.9*   No results for input(s): LIPASE, AMYLASE in the last 72 hours. No results for input(s): AMMONIA in the last 72 hours. CBC:  Recent Labs  03/27/14 1250 03/28/14 0521  WBC 31.4* 19.3*  NEUTROABS 27.3*  --   HGB 12.3 12.6  HCT 41.5 40.8  MCV 87.4 87.0  PLT 404* 259   Cardiac Enzymes:  Recent Labs  03/27/14 1250  TROPONINI <0.03   BNP: No results for input(s): PROBNP in the last 72 hours. D-Dimer: No results for input(s): DDIMER in the last 72 hours. CBG:  Recent Labs  03/27/14 1249 03/27/14 1705 03/27/14 2350 03/28/14 0537 03/28/14 0917  GLUCAP 184* 169* 123* 113* 134*   Hemoglobin A1C: No results for input(s): HGBA1C in the last 72 hours. Fasting Lipid Panel: No results for input(s): CHOL, HDL, LDLCALC, TRIG, CHOLHDL, LDLDIRECT in the last 72 hours. Thyroid Function Tests: No results for input(s): TSH, T4TOTAL, FREET4, T3FREE, THYROIDAB in the last 72 hours. Anemia Panel: No results for input(s): VITAMINB12, FOLATE, FERRITIN, TIBC, IRON, RETICCTPCT in the last 72 hours. Coagulation:  Recent Labs  03/27/14 1250  LABPROT 15.9*  INR 1.26   Urine Drug Screen: Drugs of Abuse     Component Value Date/Time   LABOPIA NONE DETECTED 03/27/2014 1505   COCAINSCRNUR NONE DETECTED  03/27/2014 1505   LABBENZ NONE DETECTED 03/27/2014 1505   AMPHETMU NONE DETECTED 03/27/2014 1505   THCU NONE DETECTED 03/27/2014 1505   LABBARB NONE DETECTED 03/27/2014 1505    Alcohol Level:  Recent Labs  03/27/14 1250  ETH <5   Urinalysis:  Recent Labs  03/27/14 1505  COLORURINE YELLOW  LABSPEC 1.015  PHURINE 5.5  GLUCOSEU NEGATIVE  HGBUR LARGE*  BILIRUBINUR NEGATIVE  KETONESUR NEGATIVE  PROTEINUR NEGATIVE  UROBILINOGEN 0.2  NITRITE NEGATIVE  LEUKOCYTESUR SMALL*   Misc. Labs:   ABGS:  Recent Labs  03/28/14 0500  PHART 7.447  PO2ART 110.0*  TCO2 19.2  HCO3 22.1     MICROBIOLOGY: Recent Results (from the past 240 hour(s))  MRSA PCR Screening     Status: Abnormal   Collection Time: 03/27/14  4:30 PM  Result Value Ref Range Status   MRSA by PCR POSITIVE (A) NEGATIVE Final    Comment:        The GeneXpert MRSA Assay (FDA approved for NASAL specimens only), is one component of a comprehensive MRSA colonization surveillance program. It is not intended to diagnose MRSA infection nor to guide or monitor treatment for MRSA infections. RESULT CALLED TO, READ BACK BY AND VERIFIED WITH: DANIELS,J. AT 1933 ON 03/27/2014 BY BAUGHAM,M.   Clostridium Difficile by PCR     Status: None   Collection Time: 03/27/14  5:00 PM  Result Value Ref Range Status   C difficile by pcr NEGATIVE NEGATIVE Final    Studies/Results: Ct Head Wo Contrast  03/27/2014   CLINICAL DATA:  Hypoxia; unresponsive  EXAM: CT HEAD WITHOUT CONTRAST  TECHNIQUE: Contiguous axial images were obtained from the base of the skull through the vertex without intravenous contrast.  COMPARISON:  October 22, 2013  FINDINGS: A degree of motion artifact makes this study less than optimal. Ventricles are normal in size and configuration. There is no apparent intracranial mass, hemorrhage, extra-axial fluid collection, or midline shift. No focal gray-white compartment lesions are identified. No acute infarct  is apparent. The bony calvarium appears intact. The mastoid air cells are clear. Endotracheal tube is present.  IMPRESSION: Motion artifact makes this study somewhat less than optimal. No intracranial mass or hemorrhage is appreciable. No extra-axial fluid collection is seen. The gray-white compartments appear unremarkable allowing for some degradation due to motion artifact. No acute infarct appreciable on this study.   Electronically Signed   By: Lowella Grip M.D.   On: 03/27/2014 15:05   Dg Chest Portable 1 View  03/27/2014   CLINICAL DATA:  Loss of consciousness.  Unresponsive.  EXAM: PORTABLE CHEST - 1 VIEW  COMPARISON:  10/22/2013  FINDINGS: The endotracheal tube is 5 cm above carina. The NG tube is coursing down the esophagus into the stomach. Low  lung volumes with vascular crowding atelectasis. There is tortuosity of the thoracic aorta.  IMPRESSION: Support apparatus in good position without complicating features.  Low lung volumes with vascular crowding and atelectasis.   Electronically Signed   By: Kalman Jewels M.D.   On: 03/27/2014 13:48    Medications:  Prior to Admission:  Prescriptions prior to admission  Medication Sig Dispense Refill Last Dose  . acetaminophen (TYLENOL) 500 MG tablet Take 500 mg by mouth every 6 (six) hours as needed for pain.   unknown  . acetaminophen (TYLENOL) 650 MG CR tablet Take 1,300 mg by mouth every 6 (six) hours as needed for pain.   03/13/2014 at Unknown time  . acidophilus (RISAQUAD) CAPS capsule Take 1 capsule by mouth daily.   03/27/2014 at Unknown time  . albuterol (PROVENTIL HFA) 108 (90 BASE) MCG/ACT inhaler Inhale 2 puffs into the lungs every 8 (eight) hours as needed (Asthma).    unknown  . alendronate (FOSAMAX) 70 MG tablet Take 70 mg by mouth once a week. Take with a full glass of water on an empty stomach.   03/19/2014 at Unknown time  . alum & mag hydroxide-simeth (MAALOX/MYLANTA) 200-200-20 MG/5ML suspension Take 30 mLs by mouth every 4  (four) hours as needed for indigestion or heartburn.   03/19/2014 at Unknown time  . aspirin EC 81 MG tablet Take 81 mg by mouth daily.   03/26/2014 at Unknown time  . baclofen (LIORESAL) 10 MG tablet Take 10 mg by mouth 2 (two) times daily.    03/27/2014 at Unknown time  . buPROPion (WELLBUTRIN XL) 150 MG 24 hr tablet Take 150 mg by mouth daily.   03/27/2014 at Unknown time  . buPROPion (WELLBUTRIN XL) 300 MG 24 hr tablet Take 300 mg by mouth daily.   03/27/2014 at Unknown time  . calcium citrate-vitamin D 500-400 MG-UNIT Chew 1 tablet by mouth 2 (two) times daily.   03/27/2014 at Unknown time  . carvedilol (COREG) 12.5 MG tablet Take 12.5 mg by mouth 2 (two) times daily with a meal.   03/27/2014 at 01000  . cetirizine (ZYRTEC) 10 MG tablet Take 10 mg by mouth daily.   03/27/2014 at Unknown time  . clonazePAM (KLONOPIN) 1 MG tablet Take 1 mg by mouth 3 (three) times daily.   03/27/2014 at Unknown time  . Cranberry 475 MG CAPS Take 1 capsule by mouth daily.   03/27/2014 at Unknown time  . diclofenac (VOLTAREN) 50 MG EC tablet Take 50 mg by mouth 2 (two) times daily.   03/26/2014 at Unknown time  . ezetimibe (ZETIA) 10 MG tablet Take 10 mg by mouth daily.   03/27/2014 at Unknown time  . fentaNYL (DURAGESIC - DOSED MCG/HR) 100 MCG/HR Place 1 patch onto the skin every 3 (three) days.   03/24/2014 at Unknown time  . fentaNYL (DURAGESIC - DOSED MCG/HR) 25 MCG/HR patch Place 25 mcg onto the skin every 3 (three) days. In addition to the 100 mcg patch   03/24/2014 at Unknown time  . furosemide (LASIX) 40 MG tablet Take 40 mg by mouth daily.   03/27/2014 at Unknown time  . gabapentin (NEURONTIN) 300 MG capsule Take 300 mg by mouth 3 (three) times daily.   03/27/2014 at Unknown time  . Glucosamine-Chondroitin (GLUCOSAMINE CHONDR COMPLEX PO) Take 1 tablet by mouth 3 (three) times daily.   03/27/2014 at Unknown time  . ipratropium-albuterol (DUONEB) 0.5-2.5 (3) MG/3ML SOLN Take 3 mLs by nebulization every 6  (six) hours as  needed (shortness of breath).    unknown  . iron polysaccharides (POLY-IRON 150) 150 MG capsule Take 150 mg by mouth daily.   03/27/2014 at Unknown time  . levothyroxine (SYNTHROID, LEVOTHROID) 200 MCG tablet Take 200 mcg by mouth daily. Takes with Synthroid (Levothyroxine) 37mcg for a total of 237mcg daily at 6:00am   03/27/2014 at Unknown time  . levothyroxine (SYNTHROID, LEVOTHROID) 25 MCG tablet Take 25 mcg by mouth daily. Takes with Synthroid (Levothyroxine) 276mcg for a total of 217mcg daily at 6:00am   03/27/2014  . lisinopril (PRINIVIL,ZESTRIL) 5 MG tablet Take 5 mg by mouth daily.   03/27/2014 at Unknown time  . LORazepam (ATIVAN) 0.5 MG tablet Take 0.25 mg by mouth every 6 (six) hours as needed for anxiety.   02/25/2014 at Unknown time  . montelukast (SINGULAIR) 10 MG tablet Take 10 mg by mouth daily.   03/27/2014 at Unknown time  . Multiple Vitamin (MULTIVITAMIN WITH MINERALS) TABS Take 1 tablet by mouth daily. DAILY-VITE   03/27/2014 at Unknown time  . Omega-3 Fatty Acids (SEA-OMEGA PO) Take 1 capsule by mouth 2 (two) times daily.   03/27/2014 at Unknown time  . omeprazole (PRILOSEC) 20 MG capsule Take 20 mg by mouth daily.   03/27/2014 at Unknown time  . oxyCODONE (ROXICODONE) 15 MG immediate release tablet Take 7.5 mg by mouth every 6 (six) hours as needed for pain.    unknown  . OxyCODONE HCl 7.5 MG TABA Take 1 tablet by mouth every 6 (six) hours as needed (for pain).   03/21/2014 at Unknown time  . polyethylene glycol powder (GLYCOLAX/MIRALAX) powder Take 17 g by mouth daily.   03/27/2014 at Unknown time  . potassium chloride (K-DUR) 10 MEQ tablet Take 10 mEq by mouth daily.   03/27/2014 at Unknown time  . promethazine (PHENERGAN) 12.5 MG tablet Take 12.5 mg by mouth every 6 (six) hours as needed for nausea or vomiting.   03/21/2014 at Unknown time  . pseudoephedrine-guaifenesin (MUCINEX D) 60-600 MG per tablet Take 1 tablet by mouth every 12 (twelve) hours as needed for  congestion.   03/21/2014 at Unknown time  . pyridOXINE (VITAMIN B-6) 100 MG tablet Take 100 mg by mouth 2 (two) times daily.   03/27/2014 at Unknown time  . rizatriptan (MAXALT) 10 MG tablet Take 10 mg by mouth every 8 (eight) hours as needed for migraine. May repeat in 2 hours if needed   03/21/2014 at Unknown time  . sulfamethoxazole-trimethoprim (BACTRIM DS,SEPTRA DS) 800-160 MG per tablet Take 1 tablet by mouth daily.   03/27/2014 at Unknown time  . tiZANidine (ZANAFLEX) 2 MG tablet Take 2 mg by mouth every 6 (six) hours as needed for muscle spasms.   unknown  . ciprofloxacin (CIPRO) 500 MG tablet Take 1 tablet (500 mg total) by mouth 2 (two) times daily. (Patient not taking: Reported on 03/27/2014) 10 tablet 0   . vancomycin (VANCOCIN HCL) 125 MG capsule Take 1 capsule (125 mg total) by mouth 4 (four) times daily. (Patient not taking: Reported on 03/27/2014) 30 capsule 0    Scheduled: . antiseptic oral rinse  7 mL Mouth Rinse QID  . chlorhexidine  15 mL Mouth Rinse BID  . enoxaparin (LOVENOX) injection  80 mg Subcutaneous Q24H  . insulin aspart  0-15 Units Subcutaneous QID  . pantoprazole (PROTONIX) IV  40 mg Intravenous QHS  . piperacillin-tazobactam (ZOSYN)  IV  3.375 g Intravenous Q8H  . vancomycin  1,000 mg Intravenous Q8H   Continuous: .  sodium chloride 150 mL/hr at 03/28/14 0600  . fentaNYL infusion INTRAVENOUS 25 mcg/hr (03/28/14 0600)  . midazolam (VERSED) infusion 3 mg/hr (03/28/14 0600)   XOV:ANVBTY chloride, ondansetron (ZOFRAN) IV  Assesment: She was admitted with urinary tract infection and sepsis. She has respiratory failure because of her altered mental status and for airway protection. Based on my review of the chart  she is known to have lung disease. I don't think she is ready for extubation. She is on broad-spectrum antibiotics. Active Problems:   UTI (lower urinary tract infection)   Paraplegic spinal paralysis   Hypertension   Diabetes mellitus without  complication   Encephalopathy acute   Obesity   Sepsis secondary to UTI   Respiratory failure   Acute respiratory failure    Plan: Continue current treatments. It appears that she is recovering from sepsis and her blood pressure is better. If she has more trouble with blood pressure stress dose steroids would be appropriate. Otherwise continue broad-spectrum antibiotics and ventilator support fentanyl and Versed    LOS: 1 day   Jansen Goodpasture L 03/28/2014, 11:12 AM

## 2014-03-28 NOTE — Progress Notes (Signed)
Attempted multiple sticks for picc with curling of wires and bending of introducers xs 3. Dr Arnoldo Morale in and inserted femeral line with difiiculty as well. Instructed nurses that if patient pulls out line she is to be sent to interventional radiology.

## 2014-03-29 ENCOUNTER — Encounter (HOSPITAL_COMMUNITY): Payer: PRIVATE HEALTH INSURANCE

## 2014-03-29 DIAGNOSIS — E079 Disorder of thyroid, unspecified: Secondary | ICD-10-CM

## 2014-03-29 LAB — BLOOD GAS, ARTERIAL
Acid-base deficit: 2.1 mmol/L — ABNORMAL HIGH (ref 0.0–2.0)
BICARBONATE: 21.1 meq/L (ref 20.0–24.0)
DRAWN BY: 21310
O2 Content: 3 L/min
O2 Saturation: 97.9 %
PATIENT TEMPERATURE: 37
TCO2: 18.8 mmol/L (ref 0–100)
pCO2 arterial: 29.5 mmHg — ABNORMAL LOW (ref 35.0–45.0)
pH, Arterial: 7.469 — ABNORMAL HIGH (ref 7.350–7.450)
pO2, Arterial: 112 mmHg — ABNORMAL HIGH (ref 80.0–100.0)

## 2014-03-29 LAB — BASIC METABOLIC PANEL
ANION GAP: 7 (ref 5–15)
Anion gap: 4 — ABNORMAL LOW (ref 5–15)
BUN: 20 mg/dL (ref 6–23)
BUN: 15 mg/dL (ref 6–23)
CO2: 22 mmol/L (ref 19–32)
CO2: 23 mmol/L (ref 19–32)
Calcium: 7.9 mg/dL — ABNORMAL LOW (ref 8.4–10.5)
Calcium: 7.4 mg/dL — ABNORMAL LOW (ref 8.4–10.5)
Chloride: 115 mEq/L — ABNORMAL HIGH (ref 96–112)
Chloride: 113 mEq/L — ABNORMAL HIGH (ref 96–112)
Creatinine, Ser: 0.54 mg/dL (ref 0.50–1.10)
Creatinine, Ser: 0.46 mg/dL — ABNORMAL LOW (ref 0.50–1.10)
GFR calc Af Amer: >90 mL/min (ref >90)
GFR calc Af Amer: >90 mL/min (ref >90)
GFR calc non Af Amer: >90 mL/min (ref >90)
GLUCOSE: 107 mg/dL — AB (ref 70–99)
Glucose, Bld: 130 mg/dL — ABNORMAL HIGH (ref 70–99)
Potassium: 2.4 mmol/L — CL (ref 3.5–5.1)
Potassium: 2.8 mmol/L — ABNORMAL LOW (ref 3.5–5.1)
SODIUM: 144 mmol/L (ref 135–145)
SODIUM: 140 mmol/L (ref 135–145)

## 2014-03-29 LAB — GLUCOSE, CAPILLARY
GLUCOSE-CAPILLARY: 83 mg/dL (ref 70–99)
Glucose-Capillary: 98 mg/dL (ref 70–99)
Glucose-Capillary: 118 mg/dL — ABNORMAL HIGH (ref 70–99)
Glucose-Capillary: 105 mg/dL — ABNORMAL HIGH (ref 70–99)

## 2014-03-29 LAB — CBC
HCT: 32.8 % — ABNORMAL LOW (ref 36.0–46.0)
Hemoglobin: 10.5 g/dL — ABNORMAL LOW (ref 12.0–15.0)
MCH: 27.4 pg (ref 26.0–34.0)
MCHC: 32 g/dL (ref 30.0–36.0)
MCV: 85.6 fL (ref 78.0–100.0)
Platelets: 217 10*3/uL (ref 150–400)
RBC: 3.83 MIL/uL — ABNORMAL LOW (ref 3.87–5.11)
RDW: 14.5 % (ref 11.5–15.5)
WBC: 13.1 10*3/uL — ABNORMAL HIGH (ref 4.0–10.5)

## 2014-03-29 LAB — CLOSTRIDIUM DIFFICILE BY PCR: CDIFFPCR: NEGATIVE

## 2014-03-29 LAB — TSH: TSH: 0.201 u[IU]/mL — AB (ref 0.350–4.500)

## 2014-03-29 LAB — BRAIN NATRIURETIC PEPTIDE: B Natriuretic Peptide: 73 pg/mL (ref 0.0–100.0)

## 2014-03-29 MED ORDER — POTASSIUM CHLORIDE 10 MEQ/100ML IV SOLN
10.0000 meq | INTRAVENOUS | Status: AC
  Administered 2014-03-29 (×4): 10 meq via INTRAVENOUS
  Filled 2014-03-29: qty 100

## 2014-03-29 MED ORDER — LORAZEPAM 0.5 MG PO TABS
0.5000 mg | ORAL_TABLET | Freq: Two times a day (BID) | ORAL | Status: DC
  Administered 2014-03-29 – 2014-04-01 (×7): 0.5 mg via ORAL
  Filled 2014-03-29 (×6): qty 1

## 2014-03-29 MED ORDER — LORAZEPAM 0.5 MG PO TABS
0.5000 mg | ORAL_TABLET | Freq: Four times a day (QID) | ORAL | Status: DC | PRN
  Filled 2014-03-29: qty 1

## 2014-03-29 MED ORDER — SODIUM CHLORIDE 0.9 % IJ SOLN
10.0000 mL | Freq: Two times a day (BID) | INTRAMUSCULAR | Status: DC
  Administered 2014-03-29 – 2014-03-31 (×4): 10 mL

## 2014-03-29 MED ORDER — PRO-STAT SUGAR FREE PO LIQD
30.0000 mL | Freq: Three times a day (TID) | ORAL | Status: DC
  Administered 2014-03-29 – 2014-04-01 (×9): 30 mL via ORAL
  Filled 2014-03-29 (×6): qty 30

## 2014-03-29 MED ORDER — VANCOMYCIN 50 MG/ML ORAL SOLUTION
ORAL | Status: AC
  Filled 2014-03-29: qty 5

## 2014-03-29 MED ORDER — CARVEDILOL 12.5 MG PO TABS
12.5000 mg | ORAL_TABLET | Freq: Two times a day (BID) | ORAL | Status: DC
  Administered 2014-03-29 – 2014-04-01 (×7): 12.5 mg via ORAL
  Filled 2014-03-29 (×7): qty 1

## 2014-03-29 NOTE — Consult Note (Addendum)
WOC wound consult note Reason for Consult: Severe contact dermatitis resulting from numerous diarrheal stools and periurethral catheter leakage.  Large area of erythema with pinpoint partial thickness tissue loss on buttocks, perineal area and bilateral thighs consistent with moisture associated dermatitis (MASD), specifically incontinence associate dermatitis (IAD). Additionally, the patient presents with intertriginous dermatitis (ITD) in the inframammary and beneath the pannus.   Wound type: Moisture associated skin damage, specifically ITD and IAD. There is evidence of  healed pressure ulcers on the left LE (posterior at Achilles tendon) and in the sacral area. Pressure Ulcer POA: No Wound bed: scattered open area on the bilateral thighs embedded within a large area of erythema with maceration and surface peeling Drainage (amount, consistency, odor) serous  Periwound:macerated. No satellite lesions noted. Dressing procedure/placement/frequency: Patient is on a therapeutic mattress with low air loss feature for moisture management.  I will add cleansing of affected tissue with our house pH balanced, no rinse cleanser, our Sween 24 moisturizer and application of our silicone based clear zinc moisture barrier.  Patient to be turned side to side with her HOB at or below a 30-degree angle except for meals.  Supine position to be avoided.  I will provide an antimicrobial textile (InterDry Ag+) for the areas of intertriginous dermatitis in the inframammary and abdominal skin folds. Instructions for use of this product are provide in the orders for the bedside Nursing staff. [facility] nursing team will not follow, but will remain available to this patient, the nursing and medical team.  Please re-consult if needed. Thanks, Maudie Flakes, MSN, RN, [place], [place], [place] [phone])

## 2014-03-29 NOTE — Progress Notes (Signed)
She is very confused but otherwise much improved. She has no new complaints. She was able to be extubated yesterday. She is still somewhat agitated. She continues to have significant diarrhea. Her oxygen saturation is 98% on room air now.  She is awake and alert but confused. Her chest is clear. Her heart is regular without gallop. Abdomen is soft bowel sounds are hyperactive she has a rectal tube in place and is producing copious amounts of liquid stool.  Laboratory work shows her white blood count has come down and her potassium is low.  She does not appear to have any current respiratory issues. I think she probably has Clostridium difficile and she is being worked up and treated for that. She certainly has some sort of severe diarrheal illness. I will plan to follow peripherally.

## 2014-03-29 NOTE — Progress Notes (Signed)
PROGRESS NOTE  [name] NAT:[id number] DOB: [birth year] DOA: 03/27/2014 PCP: [name], MD  HPI/Recap of past 8 hours: 69 year old female with history of hypothyroidism, diabetes mellitus and paraplegia who resides at a skilled nursing facility and was brought in on 12/31 for altered mental status and then became unresponsive. Patient was intubated and placed on the ventilator. She was noted to have a white count of 31 and was hypotensive. Blood cultures were drawn and patient was placed in the ICU.  By the following day, white blood cell count had dropped from 31-19. Patient still somewhat hypotensive, but as the day progressed became more awake today point where she became agitated and removed her IVs. Given her degree of alertness, she was able to be extubated. Patient stated that no one had been checking on her at her skilled nursing facility allegedly and she been having problems with continued diarrhea. C. difficile PCR was done on admission which was negative, however patient is noted to have foul-smelling very liquidy brown stool. She is also noted to have significant decubitus and skin breakdown on her backside. With some difficulty, surgery able to place a femoral line.  Overnight, patient with some confusion, somewhat agitated. Mittens placed to prevent central line removal. Lab work today much improved with stable blood pressures, white blood cell count. Continues to have liquid stool, however repeat C. difficile culture again today negative.  Assessment/Plan: Principal Problem:   Severe sepsis with septic shock with acute respiratory failure and encephalopathy: Initial shock, encephalopathy and respiratory failure resolved. Suspected that the underlying cause given markedly elevated white blood cell count and diarrhea, this was C. difficile. However repeat sleep differential PCR negative. We'll stop by mouth vancomycin and discussed with infectious disease. Check stool cultures.  Other potential source is backside. Diarrhea could be from hyperthyroidism. See below..   Active Problems:   Paraplegic spinal paralysis: Continue air mattress   Hypertension: Elevated blood pressures, resuming Coreg.   Diabetes mellitus without complication: Sliding scale, checking A1c  Acute encephalopathy: Patient suspected underlying dementia.  Morbid obesity: Patient is criteria with BMI greater than 40    Decubitus ulcer of sacral region, unstageable: Wound care to see. Could be source of septic shock.    Diarrhea: C. difficile negative. Checking stool cultures. Could be from hyperthyroidism. See below.    Hypothyroid: Resuming Synthroid. TSH noted to be moderately suppressed, checking free T4 and free T3. Given agitation, tachycardia, diarrhea, delirium-we certainly need to be concerned about thyroid storm.     Depression: Resuming Wellbutrin Acute renal failure: Secondary to septic shock with hypotension, resolved with IV fluids   Code Status: Full code  Family Communication: Left message with daughter  Disposition Plan: With improvement in blood pressure and white blood cell count, transferring out of stepdown to telemetry bed   Consultants:  Pulmonary-Signed off  Procedures:  Central line placement done 1/1  Antibiotics:  IV vancomycin 12/31-present  IV Zosyn 12/31-present  by mouth vancomycin 1/1-1/2   Objective: BP 122/62 mmHg  Pulse 117  Temp(Src) 99.3 F (37.4 C) (Oral)  Resp 14  Ht 5\' 10"  (1.778 m)  Wt 156.945 kg (346 lb)  BMI 49.65 kg/m2  SpO2 98%  Intake/Output Summary (Last 24 hours) at 03/29/14 1340 Last data filed at 03/29/14 1100  Gross per 24 hour  Intake 5766.57 ml  Output      0 ml  Net 5766.57 ml   Filed Weights   03/27/14 1600 03/28/14 0500 03/29/14 0725  Weight:  156.945 kg (346 lb) 159 kg (350 lb 8.5 oz) 156.945 kg (346 lb)    Exam:   General:  Alert and oriented 2, still somewhat delirious and agitated    Cardiovascular: Regular rhythm, tachycardic  Respiratory: Decreased breath sounds throughout secondary to body habitus  Abdomen: Soft, obese, nontender, hypoactive bowel sounds  Musculoskeletal: Trace pitting edema bilaterally  Skin: Back side with unstageable decubitus and breakdown   Data Reviewed: Basic Metabolic Panel:  Recent Labs Lab 03/27/14 1250 03/28/14 0521 03/29/14 0429  NA 138 142 144  K 5.6* 4.7 2.4*  CL 102 111 115*  CO2 26 18* 22  GLUCOSE 201* 130* 130*  BUN 50* 40* 20  CREATININE 1.65* 0.92 0.54  CALCIUM 8.9 7.7* 7.9*   Liver Function Tests:  Recent Labs Lab 03/27/14 1250 03/28/14 0521  AST 46* 35  ALT 31 21  ALKPHOS 128* 79  BILITOT 0.6 0.6  PROT 7.6 6.1  ALBUMIN 3.7 2.9*   No results for input(s): LIPASE, AMYLASE in the last 168 hours. No results for input(s): AMMONIA in the last 168 hours. CBC:  Recent Labs Lab 03/27/14 1250 03/28/14 0521 03/29/14 0429  WBC 31.4* 19.3* 13.1*  NEUTROABS 27.3*  --   --   HGB 12.3 12.6 10.5*  HCT 41.5 40.8 32.8*  MCV 87.4 87.0 85.6  PLT 404* 259 217   Cardiac Enzymes:    Recent Labs Lab 03/27/14 1250  TROPONINI <0.03   BNP (last 3 results) No results for input(s): PROBNP in the last 8760 hours. CBG:  Recent Labs Lab 03/28/14 1414 03/28/14 1621 03/28/14 2123 03/29/14 0805 03/29/14 1141  GLUCAP 106* 132* 128* 98 118*    Recent Results (from the past 240 hour(s))  Blood culture (routine x 2)     Status: None (Preliminary result)   Collection Time: 03/27/14  1:55 PM  Result Value Ref Range Status   Specimen Description BLOOD RIGHT HAND  Final   Special Requests BOTTLES DRAWN AEROBIC ONLY 2CC  Final   Culture NO GROWTH 2 DAYS  Final   Report Status PENDING  Incomplete  Blood culture (routine x 2)     Status: None (Preliminary result)   Collection Time: 03/27/14  3:11 PM  Result Value Ref Range Status   Specimen Description BLOOD LEFT HAND  Final   Special Requests   Final     BOTTLES DRAWN AEROBIC AND ANAEROBIC AEB=5CC ANA=4CC   Culture NO GROWTH 2 DAYS  Final   Report Status PENDING  Incomplete  MRSA PCR Screening     Status: Abnormal   Collection Time: 03/27/14  4:30 PM  Result Value Ref Range Status   MRSA by PCR POSITIVE (A) NEGATIVE Final    Comment:        The GeneXpert MRSA Assay (FDA approved for NASAL specimens only), is one component of a comprehensive MRSA colonization surveillance program. It is not intended to diagnose MRSA infection nor to guide or monitor treatment for MRSA infections. RESULT CALLED TO, READ BACK BY AND VERIFIED WITH: DANIELS,J. AT 1933 ON 03/27/2014 BY BAUGHAM,M.   Clostridium Difficile by PCR     Status: None   Collection Time: 03/27/14  5:00 PM  Result Value Ref Range Status   C difficile by pcr NEGATIVE NEGATIVE Final  Clostridium Difficile by PCR     Status: None   Collection Time: 03/29/14  5:50 AM  Result Value Ref Range Status   C difficile by pcr NEGATIVE NEGATIVE Final  Studies: Dg Chest Port 1 View  03/28/2014   CLINICAL DATA:  Respiratory failure.  EXAM: PORTABLE CHEST - 1 VIEW  COMPARISON:  March 27, 2014.  FINDINGS: Endotracheal and nasogastric tubes have been removed. No pneumothorax or pleural effusion is noted. Stable cardiomediastinal silhouette. Stable mild central pulmonary vascular congestion. Stable minimal bibasilar subsegmental atelectasis is noted. Bony thorax is intact.  IMPRESSION: Stable mild central pulmonary vascular congestion. Stable minimal bibasilar subsegmental atelectasis.   Electronically Signed   By: Sabino Dick M.D.   On: 03/28/2014 21:07    Scheduled Meds: . antiseptic oral rinse  7 mL Mouth Rinse QID  . aspirin EC  81 mg Oral Daily  . baclofen  10 mg Oral BID  . buPROPion  150 mg Oral Daily  . buPROPion  300 mg Oral Daily  . carvedilol  12.5 mg Oral BID WC  . chlorhexidine  15 mL Mouth Rinse BID  . Chlorhexidine Gluconate Cloth  6 each Topical Q0600  . enoxaparin  (LOVENOX) injection  80 mg Subcutaneous Q24H  . feeding supplement (PRO-STAT SUGAR FREE 64)  30 mL Oral TID WC  . insulin aspart  0-5 Units Subcutaneous QHS  . insulin aspart  0-9 Units Subcutaneous TID WC  . levothyroxine  200 mcg Oral QAC breakfast  . levothyroxine  25 mcg Oral QAC breakfast  . LORazepam  0.5 mg Oral BID  . mupirocin ointment  1 application Nasal BID  . pantoprazole (PROTONIX) IV  40 mg Intravenous QHS  . piperacillin-tazobactam (ZOSYN)  IV  3.375 g Intravenous Q8H  . sodium chloride  10-40 mL Intracatheter Q12H  . vancomycin  1,000 mg Intravenous Q8H    Continuous Infusions: . sodium chloride 150 mL/hr at 03/29/14 1100     Time spent: 40 minutes  [facility] Pager [phone]. If 7PM-7AM, please contact night-coverage at [email or web address], password [facility] 03/29/2014, 1:40 PM  LOS: 2 days

## 2014-03-29 NOTE — Plan of Care (Signed)
Problem: ICU Phase Progression Outcomes Goal: O2 sats trending toward baseline Outcome: Progressing Extubated 03/28/14, at present on 3Liters nasal cannula Goal: Hemodynamically stable Outcome: Progressing Remains tachycardic Goal: Pain controlled with appropriate interventions Outcome: Not Applicable Date Met:  03/50/09 No complaints of pain Goal: Initial discharge plan identified Outcome: Progressing Return to [facility] Goal: Voiding-avoid urinary catheter unless indicated Outcome: Not Progressing Patient came to hospital with chronic foley catheter.  Was changed out in ED

## 2014-03-29 NOTE — Progress Notes (Signed)
INITIAL NUTRITION ASSESSMENT  DOCUMENTATION CODES Per approved criteria  -Morbid Obesity   INTERVENTION: 30 ml Prostat TID  NUTRITION DIAGNOSIS: Increased nutrient needs related to wound healing as evidenced by estimated needs.   Goal: Pt will meet >90% of estimated needs  Monitor:  PO/supplement intake, labs, weight changes, I/O's  Reason for Assessment: Consult to assess needs  69 y.o. female  Admitting Dx: Severe sepsis with septic shock  This is a 69 year old lady, who is paraplegic, who presented with altered mental status which occurred rather rapidly. She was apparently in her usual state only hour before brought in by the emergency medical services and then became rapidly unresponsive.  ASSESSMENT: Pt is a resident of [facility] who was admitted with AMS. She was intubated on admission, but extubated on 03/28/13.  She is currently on a Heart Healthy diet, but eating poorly (15%).  Her weight has been stable over the past year, She has increased nutritional needs due to wound healing. Will add protein modular for additional nutrition support.  Labs reviewed. K: 2.4, Cl: 115, Calcium: 7.9, Glucose: 130, CBGS: 98-128.   Height: Ht Readings from Last 1 Encounters:  03/27/14 5\' 10"  (1.778 m)    Weight: Wt Readings from Last 1 Encounters:  03/29/14 346 lb (156.945 kg)    Ideal Body Weight: 144#  % Ideal Body Weight: 240%  Wt Readings from Last 10 Encounters:  03/29/14 346 lb (156.945 kg)  10/22/13 346 lb (156.945 kg)  10/11/12 346 lb (156.945 kg)  05/16/07 246 lb (111.585 kg)    Usual Body Weight: 346#  % Usual Body Weight: 100%  BMI:  Body mass index is 49.65 kg/(m^2). Extreme obesity, class III  Estimated Nutritional Needs: Kcal: 2400-2600 Protein: 115-125 grams Fluid: 2.4-2.6 L  Skin: stage II pressure ulcer on lower buttocks  Diet Order: Diet Heart  EDUCATION NEEDS: -Education not appropriate at this time   Intake/Output Summary (Last 24 hours)  at 03/29/14 1240 Last data filed at 03/29/14 1100  Gross per 24 hour  Intake 5766.57 ml  Output      0 ml  Net 5766.57 ml    Last BM: 03/29/14  Labs:   Recent Labs Lab 03/27/14 1250 03/28/14 0521 03/29/14 0429  NA 138 142 144  K 5.6* 4.7 2.4*  CL 102 111 115*  CO2 26 18* 22  BUN 50* 40* 20  CREATININE 1.65* 0.92 0.54  CALCIUM 8.9 7.7* 7.9*  GLUCOSE 201* 130* 130*    CBG (last 3)   Recent Labs  03/28/14 2123 03/29/14 0805 03/29/14 1141  GLUCAP 128* 98 118*    Scheduled Meds: . antiseptic oral rinse  7 mL Mouth Rinse QID  . aspirin EC  81 mg Oral Daily  . baclofen  10 mg Oral BID  . buPROPion  150 mg Oral Daily  . buPROPion  300 mg Oral Daily  . carvedilol  12.5 mg Oral BID WC  . chlorhexidine  15 mL Mouth Rinse BID  . Chlorhexidine Gluconate Cloth  6 each Topical Q0600  . enoxaparin (LOVENOX) injection  80 mg Subcutaneous Q24H  . insulin aspart  0-5 Units Subcutaneous QHS  . insulin aspart  0-9 Units Subcutaneous TID WC  . levothyroxine  200 mcg Oral QAC breakfast  . levothyroxine  25 mcg Oral QAC breakfast  . LORazepam  0.5 mg Oral BID  . mupirocin ointment  1 application Nasal BID  . pantoprazole (PROTONIX) IV  40 mg Intravenous QHS  . piperacillin-tazobactam (ZOSYN)  IV  3.375 g Intravenous Q8H  . sodium chloride  10-40 mL Intracatheter Q12H  . vancomycin  125 mg Oral 4 times per day  . vancomycin  1,000 mg Intravenous Q8H    Continuous Infusions: . sodium chloride 150 mL/hr at 03/29/14 1100    Past Medical History  Diagnosis Date  . Paraplegic spinal paralysis   . Hypertension   . Bipolar 1 disorder   . Thyroid disease   . Diabetes mellitus without complication   . Obesity   . Pyelonephritis   . COPD with asthma   . GERD (gastroesophageal reflux disease)     Past Surgical History  Procedure Laterality Date  . Back surgery    . Abdominal hysterectomy      Redonna Wilbert A. Jimmye Norman, RD, LDN, CDE Pager: [phone]

## 2014-03-29 NOTE — Progress Notes (Signed)
PT IS ALERT AND CONFUSED. FORGETFUL.REPEATING QUESTION. OCCASSIONAL HALLCINATIONS. O2 SAT 100% ON ROOM AIR. HR 110 IN SINUS TACH. FOLEY CATH PATENT DRAINING AMBER URINE. FLEXISEAL INTACT DRAINING WATERY BROWN LIQUID STOOL.- BILATERAL MITTS IN PLACE ON HANDS TO PREVENT PT FROM PULLING OUT RT FEMORAL.PT REMAINS ON SIZE WISE BED. REPORT CALLED TO Hebert Soho RN.

## 2014-03-30 DIAGNOSIS — E039 Hypothyroidism, unspecified: Secondary | ICD-10-CM

## 2014-03-30 LAB — CBC
HEMATOCRIT: 30.6 % — AB (ref 36.0–46.0)
Hemoglobin: 9.7 g/dL — ABNORMAL LOW (ref 12.0–15.0)
MCH: 27.2 pg (ref 26.0–34.0)
MCHC: 31.7 g/dL (ref 30.0–36.0)
MCV: 85.7 fL (ref 78.0–100.0)
Platelets: 179 10*3/uL (ref 150–400)
RBC: 3.57 MIL/uL — ABNORMAL LOW (ref 3.87–5.11)
RDW: 14.2 % (ref 11.5–15.5)
WBC: 8.6 10*3/uL (ref 4.0–10.5)

## 2014-03-30 LAB — URINE CULTURE
Colony Count: NO GROWTH
Culture: NO GROWTH

## 2014-03-30 LAB — BASIC METABOLIC PANEL
Anion gap: 6 (ref 5–15)
BUN: 10 mg/dL (ref 6–23)
CALCIUM: 8 mg/dL — AB (ref 8.4–10.5)
CO2: 23 mmol/L (ref 19–32)
Chloride: 113 mEq/L — ABNORMAL HIGH (ref 96–112)
Creatinine, Ser: 0.42 mg/dL — ABNORMAL LOW (ref 0.50–1.10)
GFR calc Af Amer: >90 mL/min (ref >90)
GFR calc non Af Amer: >90 mL/min (ref >90)
Glucose, Bld: 92 mg/dL (ref 70–99)
Potassium: 2.8 mmol/L — ABNORMAL LOW (ref 3.5–5.1)
Sodium: 142 mmol/L (ref 135–145)

## 2014-03-30 LAB — GLUCOSE, CAPILLARY
GLUCOSE-CAPILLARY: 103 mg/dL — AB (ref 70–99)
GLUCOSE-CAPILLARY: 93 mg/dL (ref 70–99)
Glucose-Capillary: 80 mg/dL (ref 70–99)
Glucose-Capillary: 91 mg/dL (ref 70–99)

## 2014-03-30 LAB — MAGNESIUM: Magnesium: 1.6 mg/dL (ref 1.5–2.5)

## 2014-03-30 LAB — HEMOGLOBIN A1C
Hgb A1c MFr Bld: 5.9 % — ABNORMAL HIGH (ref <5.7)
MEAN PLASMA GLUCOSE: 123 mg/dL — AB (ref <117)

## 2014-03-30 LAB — VANCOMYCIN, TROUGH: Vancomycin Tr: 25.9 ug/mL (ref 10.0–20.0)

## 2014-03-30 LAB — T4, FREE: Free T4: 1.52 ng/dL (ref 0.80–1.80)

## 2014-03-30 LAB — T3, FREE: T3, Free: 2 pg/mL — ABNORMAL LOW (ref 2.3–4.2)

## 2014-03-30 MED ORDER — VANCOMYCIN HCL IN DEXTROSE 1-5 GM/200ML-% IV SOLN
1000.0000 mg | Freq: Two times a day (BID) | INTRAVENOUS | Status: DC
  Administered 2014-03-30 – 2014-04-01 (×4): 1000 mg via INTRAVENOUS
  Filled 2014-03-30 (×6): qty 200

## 2014-03-30 MED ORDER — QUETIAPINE FUMARATE 25 MG PO TABS
25.0000 mg | ORAL_TABLET | Freq: Every day | ORAL | Status: DC
  Administered 2014-03-30 – 2014-03-31 (×2): 25 mg via ORAL
  Filled 2014-03-30 (×2): qty 1

## 2014-03-30 MED ORDER — POTASSIUM CHLORIDE CRYS ER 20 MEQ PO TBCR
40.0000 meq | EXTENDED_RELEASE_TABLET | Freq: Three times a day (TID) | ORAL | Status: AC
  Administered 2014-03-30 (×3): 40 meq via ORAL
  Filled 2014-03-30: qty 4
  Filled 2014-03-30 (×2): qty 2

## 2014-03-30 NOTE — Progress Notes (Signed)
ANTIBIOTIC CONSULT NOTE - follow up  Pharmacy Consult for Vancomycin and Zosyn Indication: sepsis  Allergies  Allergen Reactions  . Codeine   . Propranolol Hcl    Patient Measurements: Height: 5\' 10"  (177.8 cm) Weight: (!) 346 lb (156.945 kg) IBW/kg (Calculated) : 68.5  Vital Signs: Temp: 98.6 F (37 C) (01/03 0619) Temp Source: Oral (01/03 0619) BP: 140/68 mmHg (01/03 0619) Pulse Rate: 76 (01/03 0619) Intake/Output from previous day: 01/02 0701 - 01/03 0700 In: 2200 [P.O.:1200; I.V.:600; IV Piggyback:400] Out: 1400 [Urine:1400] Intake/Output from this shift:    Labs:  Recent Labs  03/28/14 0521 03/29/14 0429 03/29/14 1257 03/30/14 0829  WBC 19.3* 13.1*  --  8.6  HGB 12.6 10.5*  --  9.7*  PLT 259 217  --  179  CREATININE 0.92 0.54 0.46* 0.42*   Estimated Creatinine Clearance: 113.5 mL/min (by C-G formula based on Cr of 0.42).  Recent Labs  03/30/14 0829  VANCOTROUGH 25.9*    Microbiology: Recent Results (from the past 720 hour(s))  Blood culture (routine x 2)     Status: None (Preliminary result)   Collection Time: 03/27/14  1:55 PM  Result Value Ref Range Status   Specimen Description BLOOD RIGHT HAND  Final   Special Requests BOTTLES DRAWN AEROBIC ONLY 2CC  Final   Culture NO GROWTH 2 DAYS  Final   Report Status PENDING  Incomplete  Blood culture (routine x 2)     Status: None (Preliminary result)   Collection Time: 03/27/14  3:11 PM  Result Value Ref Range Status   Specimen Description BLOOD LEFT HAND  Final   Special Requests   Final    BOTTLES DRAWN AEROBIC AND ANAEROBIC AEB=5CC ANA=4CC   Culture NO GROWTH 2 DAYS  Final   Report Status PENDING  Incomplete  MRSA PCR Screening     Status: Abnormal   Collection Time: 03/27/14  4:30 PM  Result Value Ref Range Status   MRSA by PCR POSITIVE (A) NEGATIVE Final    Comment:        The GeneXpert MRSA Assay (FDA approved for NASAL specimens only), is one component of a comprehensive MRSA  colonization surveillance program. It is not intended to diagnose MRSA infection nor to guide or monitor treatment for MRSA infections. RESULT CALLED TO, READ BACK BY AND VERIFIED WITH: DANIELS,J. AT 1933 ON 03/27/2014 BY BAUGHAM,M.   Clostridium Difficile by PCR     Status: None   Collection Time: 03/27/14  5:00 PM  Result Value Ref Range Status   C difficile by pcr NEGATIVE NEGATIVE Final  Clostridium Difficile by PCR     Status: None   Collection Time: 03/29/14  5:50 AM  Result Value Ref Range Status   C difficile by pcr NEGATIVE NEGATIVE Final   Medical History: Past Medical History  Diagnosis Date  . Paraplegic spinal paralysis   . Hypertension   . Bipolar 1 disorder   . Thyroid disease   . Diabetes mellitus without complication   . Obesity   . Pyelonephritis   . COPD with asthma   . GERD (gastroesophageal reflux disease)    Anti-infectives    Start     Dose/Rate Route Frequency Ordered Stop   03/30/14 1800  vancomycin (VANCOCIN) IVPB 1000 mg/200 mL premix     1,000 mg200 mL/hr over 60 Minutes Intravenous Every 12 hours 03/30/14 0920     03/28/14 1830  vancomycin (VANCOCIN) 50 mg/mL oral solution 125 mg  Status:  Discontinued  125 mg Oral 4 times per day 03/28/14 1815 03/29/14 1340   03/28/14 1000  vancomycin (VANCOCIN) IVPB 1000 mg/200 mL premix  Status:  Discontinued     1,000 mg200 mL/hr over 60 Minutes Intravenous Every 24 hours 03/27/14 1632 03/28/14 0855   03/28/14 1000  vancomycin (VANCOCIN) IVPB 1000 mg/200 mL premix  Status:  Discontinued     1,000 mg200 mL/hr over 60 Minutes Intravenous Every 8 hours 03/28/14 0855 03/30/14 0920   03/27/14 2200  piperacillin-tazobactam (ZOSYN) IVPB 3.375 g     3.375 g12.5 mL/hr over 240 Minutes Intravenous Every 8 hours 03/27/14 1557     03/27/14 1800  vancomycin (VANCOCIN) 500 mg in sodium chloride 0.9 % 100 mL IVPB     500 mg100 mL/hr over 60 Minutes Intravenous  Once 03/27/14 1639 03/27/14 1843   03/27/14 1430   vancomycin (VANCOCIN) IVPB 1000 mg/200 mL premix     1,000 mg200 mL/hr over 60 Minutes Intravenous  Once 03/27/14 1419 03/27/14 1653   03/27/14 1430  piperacillin-tazobactam (ZOSYN) IVPB 3.375 g     3.375 g12.5 mL/hr over 240 Minutes Intravenous  Once 03/27/14 1419 03/27/14 1603     Assessment: 69yo morbidly obese female who is paraplegic.  Admitted for LOC and suspected sepsis.  SCr elevated on admission but has improved with hydration.  Estimated Creatinine Clearance: 113.5 mL/min (by C-G formula based on Cr of 0.42).   Afebrile.    Recent Labs  03/30/14 0829  VANCOTROUGH 25.9*   Goal of Therapy:  Vancomycin trough level 15-20 mcg/ml Eradicate infection.  Plan:   Zosyn 3.375gm IV q8h, each dose over 4 hrs  Decrease Vancomycin to 1000mg  IV q12hrs  Re-Check trough at steady state if warranted  Deescalate antibiotics when improved / appropriate  Monitor labs, renal fxn, and cultures  Hart Robinsons A 03/30/2014,9:21 AM

## 2014-03-30 NOTE — Progress Notes (Signed)
Pt has been awake all night - carrying on a conversation as if someone is in the room with her.  She appears to be talking to someone but noone is there.

## 2014-03-30 NOTE — Progress Notes (Signed)
PROGRESS NOTE  [name] AST:[id number] DOB: [birth year] DOA: 03/27/2014 PCP: [name], MD  HPI/Recap of past 31 hours: 69 year old female with history of hypothyroidism, diabetes mellitus and paraplegia who resides at a skilled nursing facility and was brought in on 12/31 for altered mental status and then became unresponsive. Patient was intubated and placed on the ventilator. She was noted to have a white count of 31 and was hypotensive. Blood cultures were drawn and patient was placed in the ICU.  By the following day, white blood cell count had dropped from 31-19. Patient still somewhat hypotensive, but as the day progressed became more awake today point where she became agitated and removed her IVs. Given her degree of alertness, she was able to be extubated. Patient stated that no one had been checking on her at her skilled nursing facility allegedly and she been having problems with continued diarrhea. C. difficile PCR was done on admission which was negative, however patient is noted to have foul-smelling very liquidy brown stool. She is also noted to have significant decubitus and skin breakdown on her backside. With some difficulty, surgery able to place a femoral line.  Patient remained confused. Easily agitated. Able to be transferred to floor. Second C. difficile PCR negative  Today patient with no focal complaints, but has been quite agitated all night. Suspect that this is less encephalopathy and more delirium from mania from her bipolar disorder. Heart rate has since stabilized  Assessment/Plan: Principal Problem:   Severe sepsis with septic shock with acute respiratory failure and encephalopathy: Initial shock, encephalopathy and respiratory failure resolved. Suspected that the underlying cause given markedly elevated white blood cell count and diarrhea, this was C. difficile. However repeat sleep differential PCR negative. We'll stop by mouth vancomycin and case discussed  with infectious disease. Check stool cultures. Other potential source is backside. Diarrhea could be from hyperthyroidism. See below..   Active Problems:   Paraplegic spinal paralysis: Continue air mattress   Hypertension: Elevated blood pressures, resuming Coreg.   Diabetes mellitus without complication: Sliding scale, checking A1c, CBGs staying in check  Bipolar disorder: Suspect she is in acute mania phase and delirious from this. Have started nightly Seroquel and will follow daily morning EKGs  Acute encephalopathy: Patient suspected underlying dementia.  Morbid obesity: Patient is criteria with BMI greater than 40    Decubitus ulcer of sacral region, unstageable: Wound care to see. Could be source of septic shock.    Diarrhea: C. difficile negative. Checking stool cultures. Could be from hyperthyroidism. See below.    Hypothyroid: Resuming Synthroid. TSH noted to be moderately suppressed, checking free T4 and free T3. Given agitation, tachycardia, diarrhea, delirium-we certainly need to be concerned about thyroid storm.   -However with significant improvement in heart rate overnight, I am less worried about this    Depression: Resuming Wellbutrin Acute renal failure: Secondary to septic shock with hypotension, resolved with IV fluids   Code Status: Full code  Family Communication: Left message with daughter  Disposition Plan: With improvement in blood pressure and white blood cell count, transferring out of stepdown to telemetry bed   Consultants:  Pulmonary-Signed off  Discussed with infectious disease  Procedures:  Femoral Central line placement done 1/1  Antibiotics:  IV vancomycin 12/31-present  IV Zosyn 12/31-present  by mouth vancomycin 1/1-1/2   Objective: BP 140/68 mmHg  Pulse 76  Temp(Src) 98.6 F (37 C) (Oral)  Resp 16  Ht 5\' 10"  (1.778 m)  Wt 156.945 kg (  346 lb)  BMI 49.65 kg/m2  SpO2 95%  Intake/Output Summary (Last 24 hours) at 03/30/14  1332 Last data filed at 03/30/14 0620  Gross per 24 hour  Intake    480 ml  Output   1400 ml  Net   -920 ml   Filed Weights   03/27/14 1600 03/28/14 0500 03/29/14 0725  Weight: 156.945 kg (346 lb) 159 kg (350 lb 8.5 oz) 156.945 kg (346 lb)    Exam:   General:  Agitated, delirious  Cardiovascular: Regular rhythm, regular rate  Respiratory: Decreased breath sounds throughout secondary to body habitus  Abdomen: Soft, obese, nontender, hypoactive bowel sounds  Musculoskeletal: Trace pitting edema bilaterally  Skin: Back side with unstageable decubitus and breakdown   Data Reviewed: Basic Metabolic Panel:  Recent Labs Lab 03/27/14 1250 03/28/14 0521 03/29/14 0429 03/29/14 1257 03/30/14 0829  NA 138 142 144 140 142  K 5.6* 4.7 2.4* 2.8* 2.8*  CL 102 111 115* 113* 113*  CO2 26 18* 22 23 23   GLUCOSE 201* 130* 130* 107* 92  BUN 50* 40* 20 15 10   CREATININE 1.65* 0.92 0.54 0.46* 0.42*  CALCIUM 8.9 7.7* 7.9* 7.4* 8.0*  MG  --   --   --   --  1.6   Liver Function Tests:  Recent Labs Lab 03/27/14 1250 03/28/14 0521  AST 46* 35  ALT 31 21  ALKPHOS 128* 79  BILITOT 0.6 0.6  PROT 7.6 6.1  ALBUMIN 3.7 2.9*   No results for input(s): LIPASE, AMYLASE in the last 168 hours. No results for input(s): AMMONIA in the last 168 hours. CBC:  Recent Labs Lab 03/27/14 1250 03/28/14 0521 03/29/14 0429 03/30/14 0829  WBC 31.4* 19.3* 13.1* 8.6  NEUTROABS 27.3*  --   --   --   HGB 12.3 12.6 10.5* 9.7*  HCT 41.5 40.8 32.8* 30.6*  MCV 87.4 87.0 85.6 85.7  PLT 404* 259 217 179   Cardiac Enzymes:    Recent Labs Lab 03/27/14 1250  TROPONINI <0.03   BNP (last 3 results) No results for input(s): PROBNP in the last 8760 hours. CBG:  Recent Labs Lab 03/29/14 1141 03/29/14 1627 03/29/14 2204 03/30/14 0742 03/30/14 1151  GLUCAP 118* 105* 83 80 91    Recent Results (from the past 240 hour(s))  Blood culture (routine x 2)     Status: None (Preliminary result)    Collection Time: 03/27/14  1:55 PM  Result Value Ref Range Status   Specimen Description BLOOD RIGHT HAND  Final   Special Requests BOTTLES DRAWN AEROBIC ONLY 2CC  Final   Culture NO GROWTH 3 DAYS  Final   Report Status PENDING  Incomplete  Urine culture     Status: None   Collection Time: 03/27/14  3:05 PM  Result Value Ref Range Status   Specimen Description URINE, CATHETERIZED  Final   Special Requests NONE  Final   Colony Count NO GROWTH Performed at [facility]   Final   Culture NO GROWTH Performed at [facility]   Final   Report Status 03/30/2014 FINAL  Final  Blood culture (routine x 2)     Status: None (Preliminary result)   Collection Time: 03/27/14  3:11 PM  Result Value Ref Range Status   Specimen Description BLOOD LEFT HAND  Final   Special Requests   Final    BOTTLES DRAWN AEROBIC AND ANAEROBIC AEB=5CC ANA=4CC   Culture NO GROWTH 3 DAYS  Final   Report  Status PENDING  Incomplete  MRSA PCR Screening     Status: Abnormal   Collection Time: 03/27/14  4:30 PM  Result Value Ref Range Status   MRSA by PCR POSITIVE (A) NEGATIVE Final    Comment:        The GeneXpert MRSA Assay (FDA approved for NASAL specimens only), is one component of a comprehensive MRSA colonization surveillance program. It is not intended to diagnose MRSA infection nor to guide or monitor treatment for MRSA infections. RESULT CALLED TO, READ BACK BY AND VERIFIED WITH: DANIELS,J. AT 1933 ON 03/27/2014 BY BAUGHAM,M.   Clostridium Difficile by PCR     Status: None   Collection Time: 03/27/14  5:00 PM  Result Value Ref Range Status   C difficile by pcr NEGATIVE NEGATIVE Final  Clostridium Difficile by PCR     Status: None   Collection Time: 03/29/14  5:50 AM  Result Value Ref Range Status   C difficile by pcr NEGATIVE NEGATIVE Final     Studies: No results found.  Scheduled Meds: . antiseptic oral rinse  7 mL Mouth Rinse QID  . aspirin EC  81 mg Oral Daily  .  baclofen  10 mg Oral BID  . buPROPion  150 mg Oral Daily  . buPROPion  300 mg Oral Daily  . carvedilol  12.5 mg Oral BID WC  . chlorhexidine  15 mL Mouth Rinse BID  . Chlorhexidine Gluconate Cloth  6 each Topical Q0600  . enoxaparin (LOVENOX) injection  80 mg Subcutaneous Q24H  . feeding supplement (PRO-STAT SUGAR FREE 64)  30 mL Oral TID WC  . insulin aspart  0-5 Units Subcutaneous QHS  . insulin aspart  0-9 Units Subcutaneous TID WC  . levothyroxine  200 mcg Oral QAC breakfast  . levothyroxine  25 mcg Oral QAC breakfast  . LORazepam  0.5 mg Oral BID  . mupirocin ointment  1 application Nasal BID  . pantoprazole (PROTONIX) IV  40 mg Intravenous QHS  . piperacillin-tazobactam (ZOSYN)  IV  3.375 g Intravenous Q8H  . potassium chloride  40 mEq Oral TID WC  . QUEtiapine  25 mg Oral QHS  . sodium chloride  10-40 mL Intracatheter Q12H  . sodium chloride  10-40 mL Intracatheter Q12H  . vancomycin  1,000 mg Intravenous Q12H    Continuous Infusions:     Time spent: 25 minutes  [facility] Pager [phone]. If 7PM-7AM, please contact night-coverage at [email or web address], password [facility] 03/30/2014, 1:32 PM  LOS: 3 days

## 2014-03-31 ENCOUNTER — Inpatient Hospital Stay (HOSPITAL_COMMUNITY): Payer: PRIVATE HEALTH INSURANCE

## 2014-03-31 LAB — BASIC METABOLIC PANEL
Anion gap: 7 (ref 5–15)
BUN: 7 mg/dL (ref 6–23)
CALCIUM: 8.1 mg/dL — AB (ref 8.4–10.5)
CO2: 21 mmol/L (ref 19–32)
Chloride: 115 mEq/L — ABNORMAL HIGH (ref 96–112)
Creatinine, Ser: 0.51 mg/dL (ref 0.50–1.10)
GFR calc Af Amer: >90 mL/min (ref >90)
GLUCOSE: 102 mg/dL — AB (ref 70–99)
Potassium: 2.8 mmol/L — ABNORMAL LOW (ref 3.5–5.1)
SODIUM: 143 mmol/L (ref 135–145)

## 2014-03-31 LAB — GLUCOSE, CAPILLARY
GLUCOSE-CAPILLARY: 105 mg/dL — AB (ref 70–99)
GLUCOSE-CAPILLARY: 98 mg/dL (ref 70–99)
Glucose-Capillary: 155 mg/dL — ABNORMAL HIGH (ref 70–99)
Glucose-Capillary: 115 mg/dL — ABNORMAL HIGH (ref 70–99)

## 2014-03-31 MED ORDER — ACETAMINOPHEN 325 MG PO TABS
650.0000 mg | ORAL_TABLET | Freq: Four times a day (QID) | ORAL | Status: DC | PRN
  Administered 2014-03-31: 650 mg via ORAL
  Filled 2014-03-31: qty 2

## 2014-03-31 MED ORDER — POTASSIUM CHLORIDE CRYS ER 20 MEQ PO TBCR
40.0000 meq | EXTENDED_RELEASE_TABLET | Freq: Two times a day (BID) | ORAL | Status: AC
  Administered 2014-03-31 (×2): 40 meq via ORAL
  Filled 2014-03-31 (×2): qty 2

## 2014-03-31 MED ORDER — METRONIDAZOLE 500 MG PO TABS
500.0000 mg | ORAL_TABLET | Freq: Three times a day (TID) | ORAL | Status: DC
  Administered 2014-03-31 – 2014-04-01 (×4): 500 mg via ORAL
  Filled 2014-03-31 (×4): qty 1

## 2014-03-31 NOTE — Clinical Social Work Note (Signed)
CSW attempted to meet with pt, but pt did not respond to any questions. CSW followed up with her daughter, [name], regarding possible placement in [place] as she mentioned last week. [name] states that after further thought, she feels pt would prefer to stay at [facility] as she does not like change. She has been in touch with [facility] regarding pt's room. CSW will continue to follow.  Benay Pike, [place]

## 2014-03-31 NOTE — Care Management Note (Addendum)
    Page 1 of 1   04/01/2014     2:54:38 PM CARE MANAGEMENT NOTE 04/01/2014  Patient:  [name], [name]   Account Number:  [id number]  Date Initiated:  03/31/2014  Documentation initiated by:  Theophilus Kinds  Subjective/Objective Assessment:   Pt admitted from [facility] with sepsis. Pt will return to facility at discharge.     Action/Plan:   CSW is aware and will arrange discharge to facility when medically stable.   Anticipated DC Date:  04/02/2014   Anticipated DC Plan:  SKILLED NURSING FACILITY  In-house referral  Clinical Social Worker      DC Planning Services  CM consult      Choice offered to / List presented to:             Status of service:  Completed, signed off Medicare Important Message given?  YES (If response is "NO", the following Medicare IM given date fields will be blank) Date Medicare IM given:  04/01/2014 Medicare IM given by:  Theophilus Kinds Date Additional Medicare IM given:   Additional Medicare IM given by:    Discharge Disposition:  [place]  Per UR Regulation:    If discussed at Long Length of Stay Meetings, dates discussed:    Comments:  04/01/14 [place], RN BSN CM Pt discharged back to [facility] today. CSw to arrange discharge to facility.  03/31/14 [place], RN BSN CM

## 2014-03-31 NOTE — Progress Notes (Signed)
Notified Dr. Maryland Pink to inquire if the patient could have tylenol order.  New orders given and followed.

## 2014-03-31 NOTE — Progress Notes (Signed)
PROGRESS NOTE  [name] ZTI:[id number] DOB: 08-16-1945 DOA: 03/27/2014 PCP: [name], MD  HPI/Recap of past 55 hours: 69 year old female with history of hypothyroidism, diabetes mellitus and paraplegia who resides at a skilled nursing facility and was brought in on 12/31 for altered mental status and then became unresponsive. Patient was intubated and placed on the ventilator. She was noted to have a white count of 31 and was hypotensive. Blood cultures were drawn and patient was placed in the ICU.  By the following day, white blood cell count had dropped from 31-19. Patient still somewhat hypotensive, but as the day progressed became more awake today point where she became agitated and removed her IVs. Given her degree of alertness, she was able to be extubated. Patient stated that no one had been checking on her at her skilled nursing facility allegedly and she been having problems with continued diarrhea. C. difficile PCR was done on admission which was negative, however patient is noted to have foul-smelling very liquidy brown stool. She is also noted to have significant decubitus and skin breakdown on her backside. With some difficulty, surgery able to place a femoral line.  Patient remained confused. Easily agitated. Able to be transferred to floor. Second C. difficile PCR negative.  Tachycardia had resolved at that point.  Patient has slowly become more agitated and delirious, suspect she is in manic portion of bipolar disorder and started on Seroquel on evening of 1/3.  Today, patient is complete opposite where she will not verbally respond, but does stare. She does not really follow commands. Still having low-grade temperatures  Assessment/Plan: Principal Problem:   Severe sepsis with septic shock with acute respiratory failure and encephalopathy: Initial shock, encephalopathy and respiratory failure resolved. Suspected that the underlying cause given markedly elevated white blood  cell count and diarrhea, this was C. difficile. However repeat sleep differential PCR negative. We'll stop by mouth vancomycin and case discussed with infectious disease. Check stool cultures. Given low-grade temps, will repeat chest x-ray and if unrevealing, would favor re-starting by mouth vancomycin  Active Problems:   Paraplegic spinal paralysis: Continue air mattress   Hypertension: Elevated blood pressures, resuming Coreg.   Diabetes mellitus without complication: Sliding scale, checking A1c, CBGs staying in check  Bipolar disorder: Suspect she is in acute mania phase and delirious from this. Have started nightly Seroquel and will follow daily morning EKGs. Now she looks to be much more withdrawn  Acute encephalopathy: Patient suspected underlying dementia and bipolar disorder.  Morbid obesity: Patient is criteria with BMI greater than 40    Decubitus ulcer of sacral region, unstageable: Appreciate wound care's help. Does not appear to be the actual source of her septic shock    Diarrhea: C. difficile negative. Checking stool cultures.    Hypothyroid: Resuming Synthroid. TSH noted to be moderately suppressed, checking free T4 and free T3. Given agitation, tachycardia, diarrhea, delirium-initially was concerned about are starting, however things improved too quickly for that. Lab work only notes normal free T4 and low free T3, likely subacute hyperthyroidism if that. Would favor when she is stable, repeat labs in 1-2 months    Depression: Resuming Wellbutrin Acute renal failure: Secondary to septic shock with hypotension, resolved with IV fluids   Code Status: Full code  Family Communication: Left message with daughter  Disposition Plan: Discharged to skilled nursing once infection source felt to be recognized and psychosis better stabilized   Consultants:  Pulmonary-Signed off  Discussed with infectious disease  Procedures:  Femoral Central line placement done  1/1  Antibiotics:  IV vancomycin 12/31-present  IV Zosyn 12/31-present  by mouth vancomycin 1/1-1/2   Objective: BP 169/78 mmHg  Pulse 67  Temp(Src) 100.8 F (38.2 C) (Oral)  Resp 20  Ht 5\' 10"  (1.778 m)  Wt 156.945 kg (346 lb)  BMI 49.65 kg/m2  SpO2 97%  Intake/Output Summary (Last 24 hours) at 03/31/14 1647 Last data filed at 03/31/14 1425  Gross per 24 hour  Intake    600 ml  Output   1500 ml  Net   -900 ml   Filed Weights   03/27/14 1600 03/28/14 0500 03/29/14 0725  Weight: 156.945 kg (346 lb) 159 kg (350 lb 8.5 oz) 156.945 kg (346 lb)    Exam:   General:  Nonverbal, stares  Cardiovascular: Regular rhythm, regular rate  Respiratory: Decreased breath sounds throughout secondary to body habitus  Abdomen: Soft, obese, nontender, hypoactive bowel sounds  Musculoskeletal: Trace pitting edema bilaterally  Skin: Back side with unstageable decubitus and breakdown   Data Reviewed: Basic Metabolic Panel:  Recent Labs Lab 03/28/14 0521 03/29/14 0429 03/29/14 1257 03/30/14 0829 03/31/14 0636  NA 142 144 140 142 143  K 4.7 2.4* 2.8* 2.8* 2.8*  CL 111 115* 113* 113* 115*  CO2 18* 22 23 23 21   GLUCOSE 130* 130* 107* 92 102*  BUN 40* 20 15 10 7   CREATININE 0.92 0.54 0.46* 0.42* 0.51  CALCIUM 7.7* 7.9* 7.4* 8.0* 8.1*  MG  --   --   --  1.6  --    Liver Function Tests:  Recent Labs Lab 03/27/14 1250 03/28/14 0521  AST 46* 35  ALT 31 21  ALKPHOS 128* 79  BILITOT 0.6 0.6  PROT 7.6 6.1  ALBUMIN 3.7 2.9*   No results for input(s): LIPASE, AMYLASE in the last 168 hours. No results for input(s): AMMONIA in the last 168 hours. CBC:  Recent Labs Lab 03/27/14 1250 03/28/14 0521 03/29/14 0429 03/30/14 0829  WBC 31.4* 19.3* 13.1* 8.6  NEUTROABS 27.3*  --   --   --   HGB 12.3 12.6 10.5* 9.7*  HCT 41.5 40.8 32.8* 30.6*  MCV 87.4 87.0 85.6 85.7  PLT 404* 259 217 179   Cardiac Enzymes:    Recent Labs Lab 03/27/14 1250  TROPONINI <0.03   BNP  (last 3 results) No results for input(s): PROBNP in the last 8760 hours. CBG:  Recent Labs Lab 03/30/14 1647 03/30/14 2202 03/31/14 0806 03/31/14 1148 03/31/14 1639  GLUCAP 103* 93 105* 155* 115*    Recent Results (from the past 240 hour(s))  Blood culture (routine x 2)     Status: None (Preliminary result)   Collection Time: 03/27/14  1:55 PM  Result Value Ref Range Status   Specimen Description BLOOD RIGHT HAND  Final   Special Requests BOTTLES DRAWN AEROBIC ONLY 2CC  Final   Culture NO GROWTH 4 DAYS  Final   Report Status PENDING  Incomplete  Urine culture     Status: None   Collection Time: 03/27/14  3:05 PM  Result Value Ref Range Status   Specimen Description URINE, CATHETERIZED  Final   Special Requests NONE  Final   Colony Count NO GROWTH Performed at [facility]   Final   Culture NO GROWTH Performed at [facility]   Final   Report Status 03/30/2014 FINAL  Final  Blood culture (routine x 2)     Status: None (Preliminary result)  Collection Time: 03/27/14  3:11 PM  Result Value Ref Range Status   Specimen Description BLOOD LEFT HAND  Final   Special Requests   Final    BOTTLES DRAWN AEROBIC AND ANAEROBIC AEB=5CC ANA=4CC   Culture NO GROWTH 4 DAYS  Final   Report Status PENDING  Incomplete  MRSA PCR Screening     Status: Abnormal   Collection Time: 03/27/14  4:30 PM  Result Value Ref Range Status   MRSA by PCR POSITIVE (A) NEGATIVE Final    Comment:        The GeneXpert MRSA Assay (FDA approved for NASAL specimens only), is one component of a comprehensive MRSA colonization surveillance program. It is not intended to diagnose MRSA infection nor to guide or monitor treatment for MRSA infections. RESULT CALLED TO, READ BACK BY AND VERIFIED WITH: DANIELS,J. AT 1933 ON 03/27/2014 BY BAUGHAM,M.   Clostridium Difficile by PCR     Status: None   Collection Time: 03/27/14  5:00 PM  Result Value Ref Range Status   C difficile by pcr  NEGATIVE NEGATIVE Final  Clostridium Difficile by PCR     Status: None   Collection Time: 03/29/14  5:50 AM  Result Value Ref Range Status   C difficile by pcr NEGATIVE NEGATIVE Final     Studies: No results found.  Scheduled Meds: . antiseptic oral rinse  7 mL Mouth Rinse QID  . aspirin EC  81 mg Oral Daily  . baclofen  10 mg Oral BID  . buPROPion  150 mg Oral Daily  . buPROPion  300 mg Oral Daily  . carvedilol  12.5 mg Oral BID WC  . chlorhexidine  15 mL Mouth Rinse BID  . Chlorhexidine Gluconate Cloth  6 each Topical Q0600  . enoxaparin (LOVENOX) injection  80 mg Subcutaneous Q24H  . feeding supplement (PRO-STAT SUGAR FREE 64)  30 mL Oral TID WC  . insulin aspart  0-5 Units Subcutaneous QHS  . insulin aspart  0-9 Units Subcutaneous TID WC  . levothyroxine  200 mcg Oral QAC breakfast  . levothyroxine  25 mcg Oral QAC breakfast  . LORazepam  0.5 mg Oral BID  . mupirocin ointment  1 application Nasal BID  . pantoprazole (PROTONIX) IV  40 mg Intravenous QHS  . piperacillin-tazobactam (ZOSYN)  IV  3.375 g Intravenous Q8H  . QUEtiapine  25 mg Oral QHS  . sodium chloride  10-40 mL Intracatheter Q12H  . sodium chloride  10-40 mL Intracatheter Q12H  . vancomycin  1,000 mg Intravenous Q12H    Continuous Infusions:     Time spent: 25 minutes  [place] [facility] Pager [phone]. If 7PM-7AM, please contact night-coverage at [email or web address], password [facility] 03/31/2014, 4:47 PM  LOS: 4 days

## 2014-04-01 DIAGNOSIS — E871 Hypo-osmolality and hyponatremia: Secondary | ICD-10-CM | POA: Diagnosis not present

## 2014-04-01 DIAGNOSIS — F411 Generalized anxiety disorder: Secondary | ICD-10-CM | POA: Diagnosis present

## 2014-04-01 DIAGNOSIS — I959 Hypotension, unspecified: Secondary | ICD-10-CM | POA: Diagnosis not present

## 2014-04-01 DIAGNOSIS — B377 Candidal sepsis: Secondary | ICD-10-CM | POA: Diagnosis present

## 2014-04-01 DIAGNOSIS — G9340 Encephalopathy, unspecified: Secondary | ICD-10-CM | POA: Diagnosis not present

## 2014-04-01 DIAGNOSIS — R739 Hyperglycemia, unspecified: Secondary | ICD-10-CM | POA: Diagnosis not present

## 2014-04-01 DIAGNOSIS — Z90710 Acquired absence of both cervix and uterus: Secondary | ICD-10-CM | POA: Diagnosis not present

## 2014-04-01 DIAGNOSIS — F419 Anxiety disorder, unspecified: Secondary | ICD-10-CM | POA: Diagnosis not present

## 2014-04-01 DIAGNOSIS — I739 Peripheral vascular disease, unspecified: Secondary | ICD-10-CM | POA: Diagnosis not present

## 2014-04-01 DIAGNOSIS — M479 Spondylosis, unspecified: Secondary | ICD-10-CM | POA: Diagnosis present

## 2014-04-01 DIAGNOSIS — R279 Unspecified lack of coordination: Secondary | ICD-10-CM | POA: Diagnosis not present

## 2014-04-01 DIAGNOSIS — Z79899 Other long term (current) drug therapy: Secondary | ICD-10-CM | POA: Diagnosis not present

## 2014-04-01 DIAGNOSIS — B9689 Other specified bacterial agents as the cause of diseases classified elsewhere: Secondary | ICD-10-CM | POA: Diagnosis not present

## 2014-04-01 DIAGNOSIS — R079 Chest pain, unspecified: Secondary | ICD-10-CM | POA: Diagnosis not present

## 2014-04-01 DIAGNOSIS — F339 Major depressive disorder, recurrent, unspecified: Secondary | ICD-10-CM | POA: Diagnosis not present

## 2014-04-01 DIAGNOSIS — R197 Diarrhea, unspecified: Secondary | ICD-10-CM | POA: Diagnosis not present

## 2014-04-01 DIAGNOSIS — L8995 Pressure ulcer of unspecified site, unstageable: Secondary | ICD-10-CM | POA: Diagnosis not present

## 2014-04-01 DIAGNOSIS — Z885 Allergy status to narcotic agent status: Secondary | ICD-10-CM | POA: Diagnosis not present

## 2014-04-01 DIAGNOSIS — N390 Urinary tract infection, site not specified: Secondary | ICD-10-CM | POA: Diagnosis not present

## 2014-04-01 DIAGNOSIS — G894 Chronic pain syndrome: Secondary | ICD-10-CM | POA: Diagnosis present

## 2014-04-01 DIAGNOSIS — Z7982 Long term (current) use of aspirin: Secondary | ICD-10-CM | POA: Diagnosis not present

## 2014-04-01 DIAGNOSIS — E876 Hypokalemia: Secondary | ICD-10-CM | POA: Diagnosis not present

## 2014-04-01 DIAGNOSIS — E46 Unspecified protein-calorie malnutrition: Secondary | ICD-10-CM | POA: Diagnosis not present

## 2014-04-01 DIAGNOSIS — L739 Follicular disorder, unspecified: Secondary | ICD-10-CM | POA: Diagnosis present

## 2014-04-01 DIAGNOSIS — H04123 Dry eye syndrome of bilateral lacrimal glands: Secondary | ICD-10-CM | POA: Diagnosis not present

## 2014-04-01 DIAGNOSIS — J9600 Acute respiratory failure, unspecified whether with hypoxia or hypercapnia: Secondary | ICD-10-CM | POA: Diagnosis not present

## 2014-04-01 DIAGNOSIS — M4649 Discitis, unspecified, multiple sites in spine: Secondary | ICD-10-CM | POA: Diagnosis present

## 2014-04-01 DIAGNOSIS — N183 Chronic kidney disease, stage 3 (moderate): Secondary | ICD-10-CM | POA: Diagnosis not present

## 2014-04-01 DIAGNOSIS — R000 Tachycardia, unspecified: Secondary | ICD-10-CM | POA: Diagnosis not present

## 2014-04-01 DIAGNOSIS — Z87891 Personal history of nicotine dependence: Secondary | ICD-10-CM | POA: Diagnosis not present

## 2014-04-01 DIAGNOSIS — E039 Hypothyroidism, unspecified: Secondary | ICD-10-CM | POA: Diagnosis not present

## 2014-04-01 DIAGNOSIS — J449 Chronic obstructive pulmonary disease, unspecified: Secondary | ICD-10-CM | POA: Diagnosis present

## 2014-04-01 DIAGNOSIS — F3131 Bipolar disorder, current episode depressed, mild: Secondary | ICD-10-CM | POA: Diagnosis not present

## 2014-04-01 DIAGNOSIS — M4626 Osteomyelitis of vertebra, lumbar region: Secondary | ICD-10-CM | POA: Diagnosis present

## 2014-04-01 DIAGNOSIS — G8220 Paraplegia, unspecified: Secondary | ICD-10-CM | POA: Diagnosis present

## 2014-04-01 DIAGNOSIS — Z7983 Long term (current) use of bisphosphonates: Secondary | ICD-10-CM | POA: Diagnosis not present

## 2014-04-01 DIAGNOSIS — R0902 Hypoxemia: Secondary | ICD-10-CM | POA: Diagnosis not present

## 2014-04-01 DIAGNOSIS — M6281 Muscle weakness (generalized): Secondary | ICD-10-CM | POA: Diagnosis not present

## 2014-04-01 DIAGNOSIS — F3130 Bipolar disorder, current episode depressed, mild or moderate severity, unspecified: Secondary | ICD-10-CM | POA: Diagnosis not present

## 2014-04-01 DIAGNOSIS — Z7289 Other problems related to lifestyle: Secondary | ICD-10-CM | POA: Diagnosis not present

## 2014-04-01 DIAGNOSIS — N12 Tubulo-interstitial nephritis, not specified as acute or chronic: Secondary | ICD-10-CM | POA: Diagnosis not present

## 2014-04-01 DIAGNOSIS — D72829 Elevated white blood cell count, unspecified: Secondary | ICD-10-CM | POA: Diagnosis not present

## 2014-04-01 DIAGNOSIS — M109 Gout, unspecified: Secondary | ICD-10-CM | POA: Diagnosis not present

## 2014-04-01 DIAGNOSIS — K219 Gastro-esophageal reflux disease without esophagitis: Secondary | ICD-10-CM | POA: Diagnosis not present

## 2014-04-01 DIAGNOSIS — I10 Essential (primary) hypertension: Secondary | ICD-10-CM | POA: Diagnosis present

## 2014-04-01 DIAGNOSIS — G061 Intraspinal abscess and granuloma: Secondary | ICD-10-CM | POA: Diagnosis present

## 2014-04-01 DIAGNOSIS — W06XXXA Fall from bed, initial encounter: Secondary | ICD-10-CM | POA: Diagnosis not present

## 2014-04-01 DIAGNOSIS — H04213 Epiphora due to excess lacrimation, bilateral lacrimal glands: Secondary | ICD-10-CM | POA: Diagnosis not present

## 2014-04-01 DIAGNOSIS — N179 Acute kidney failure, unspecified: Secondary | ICD-10-CM | POA: Diagnosis not present

## 2014-04-01 DIAGNOSIS — M160 Bilateral primary osteoarthritis of hip: Secondary | ICD-10-CM | POA: Diagnosis not present

## 2014-04-01 DIAGNOSIS — M150 Primary generalized (osteo)arthritis: Secondary | ICD-10-CM | POA: Diagnosis not present

## 2014-04-01 DIAGNOSIS — A047 Enterocolitis due to Clostridium difficile: Secondary | ICD-10-CM | POA: Diagnosis not present

## 2014-04-01 DIAGNOSIS — J189 Pneumonia, unspecified organism: Secondary | ICD-10-CM | POA: Diagnosis not present

## 2014-04-01 DIAGNOSIS — L89150 Pressure ulcer of sacral region, unstageable: Secondary | ICD-10-CM | POA: Diagnosis not present

## 2014-04-01 DIAGNOSIS — Z7401 Bed confinement status: Secondary | ICD-10-CM | POA: Diagnosis not present

## 2014-04-01 DIAGNOSIS — B009 Herpesviral infection, unspecified: Secondary | ICD-10-CM | POA: Diagnosis not present

## 2014-04-01 DIAGNOSIS — L259 Unspecified contact dermatitis, unspecified cause: Secondary | ICD-10-CM | POA: Diagnosis not present

## 2014-04-01 DIAGNOSIS — Z888 Allergy status to other drugs, medicaments and biological substances status: Secondary | ICD-10-CM | POA: Diagnosis not present

## 2014-04-01 DIAGNOSIS — A419 Sepsis, unspecified organism: Secondary | ICD-10-CM | POA: Diagnosis not present

## 2014-04-01 DIAGNOSIS — F319 Bipolar disorder, unspecified: Secondary | ICD-10-CM | POA: Diagnosis present

## 2014-04-01 DIAGNOSIS — R401 Stupor: Secondary | ICD-10-CM | POA: Diagnosis not present

## 2014-04-01 DIAGNOSIS — I119 Hypertensive heart disease without heart failure: Secondary | ICD-10-CM | POA: Diagnosis not present

## 2014-04-01 DIAGNOSIS — E669 Obesity, unspecified: Secondary | ICD-10-CM | POA: Diagnosis not present

## 2014-04-01 DIAGNOSIS — M129 Arthropathy, unspecified: Secondary | ICD-10-CM | POA: Diagnosis not present

## 2014-04-01 DIAGNOSIS — Z960 Presence of urogenital implants: Secondary | ICD-10-CM | POA: Diagnosis present

## 2014-04-01 DIAGNOSIS — K5900 Constipation, unspecified: Secondary | ICD-10-CM | POA: Diagnosis present

## 2014-04-01 DIAGNOSIS — J45909 Unspecified asthma, uncomplicated: Secondary | ICD-10-CM | POA: Diagnosis not present

## 2014-04-01 DIAGNOSIS — S29019A Strain of muscle and tendon of unspecified wall of thorax, initial encounter: Secondary | ICD-10-CM | POA: Diagnosis not present

## 2014-04-01 DIAGNOSIS — E119 Type 2 diabetes mellitus without complications: Secondary | ICD-10-CM | POA: Diagnosis present

## 2014-04-01 DIAGNOSIS — D649 Anemia, unspecified: Secondary | ICD-10-CM | POA: Diagnosis not present

## 2014-04-01 DIAGNOSIS — S134XXA Sprain of ligaments of cervical spine, initial encounter: Secondary | ICD-10-CM | POA: Diagnosis not present

## 2014-04-01 DIAGNOSIS — R251 Tremor, unspecified: Secondary | ICD-10-CM | POA: Diagnosis present

## 2014-04-01 DIAGNOSIS — R6521 Severe sepsis with septic shock: Secondary | ICD-10-CM | POA: Diagnosis not present

## 2014-04-01 DIAGNOSIS — Z79891 Long term (current) use of opiate analgesic: Secondary | ICD-10-CM | POA: Diagnosis not present

## 2014-04-01 DIAGNOSIS — E785 Hyperlipidemia, unspecified: Secondary | ICD-10-CM | POA: Diagnosis not present

## 2014-04-01 LAB — CULTURE, BLOOD (ROUTINE X 2)
CULTURE: NO GROWTH
Culture: NO GROWTH

## 2014-04-01 LAB — GLUCOSE, CAPILLARY
GLUCOSE-CAPILLARY: 131 mg/dL — AB (ref 70–99)
GLUCOSE-CAPILLARY: 110 mg/dL — AB (ref 70–99)

## 2014-04-01 LAB — BASIC METABOLIC PANEL
ANION GAP: 8 (ref 5–15)
BUN: 9 mg/dL (ref 6–23)
CALCIUM: 7.8 mg/dL — AB (ref 8.4–10.5)
CHLORIDE: 118 meq/L — AB (ref 96–112)
CO2: 21 mmol/L (ref 19–32)
Creatinine, Ser: 0.71 mg/dL (ref 0.50–1.10)
GFR calc non Af Amer: 88 mL/min — ABNORMAL LOW (ref >90)
Glucose, Bld: 133 mg/dL — ABNORMAL HIGH (ref 70–99)
Potassium: 3 mmol/L — ABNORMAL LOW (ref 3.5–5.1)
SODIUM: 147 mmol/L — AB (ref 135–145)

## 2014-04-01 LAB — CBC
HCT: 31 % — ABNORMAL LOW (ref 36.0–46.0)
Hemoglobin: 10 g/dL — ABNORMAL LOW (ref 12.0–15.0)
MCH: 27.5 pg (ref 26.0–34.0)
MCHC: 32.3 g/dL (ref 30.0–36.0)
MCV: 85.2 fL (ref 78.0–100.0)
Platelets: 167 10*3/uL (ref 150–400)
RBC: 3.64 MIL/uL — AB (ref 3.87–5.11)
RDW: 14.4 % (ref 11.5–15.5)
WBC: 8.9 10*3/uL (ref 4.0–10.5)

## 2014-04-01 MED ORDER — QUETIAPINE FUMARATE 25 MG PO TABS: 12.5000 mg | ORAL_TABLET | Freq: Every day | ORAL | Status: DC

## 2014-04-01 MED ORDER — METRONIDAZOLE 500 MG PO TABS: 500.0000 mg | ORAL_TABLET | Freq: Three times a day (TID) | ORAL | Status: AC

## 2014-04-01 MED ORDER — OXYCODONE HCL 5 MG PO CAPS
5.0000 mg | ORAL_CAPSULE | Freq: Four times a day (QID) | ORAL | Status: DC | PRN
Start: 2014-04-01 — End: 2014-05-20

## 2014-04-01 MED ORDER — PRO-STAT SUGAR FREE PO LIQD: 30.0000 mL | Freq: Three times a day (TID) | ORAL | Status: DC

## 2014-04-01 MED ORDER — FENTANYL 25 MCG/HR TD PT72: 25.0000 ug | MEDICATED_PATCH | TRANSDERMAL | Status: DC

## 2014-04-01 MED ORDER — LORAZEPAM 0.5 MG PO TABS
0.2500 mg | ORAL_TABLET | Freq: Four times a day (QID) | ORAL | Status: DC | PRN
Start: 2014-04-01 — End: 2014-05-20

## 2014-04-01 NOTE — Discharge Summary (Signed)
Discharge Summary  [name] CBS:[id number] DOB: 01-Jul-1945  PCP: [name], MD  Admit date: 03/27/2014 Discharge date: 04/01/2014  Time spent: 35 minutes  Recommendations for Outpatient Follow-up:  1. New medication: Flagyl 500 mg every 8 hours 13 days 2. Medication change: Patient's fentanyl patch decreased from 125 g per hour to 25 mcg/hr 3. Medication change: Patient's scheduled Klonopin discontinued. She will continue on her when necessary Xanax 4. Medication change: Patient on OxyIR 15 mg and 7.5 mg as needed. This is being decreased to 5 mg by mouth every 4 hours when necessary 5. New medication: Seroquel 7.5 mg by mouth daily at bedtime 6. Medication change: Patient previous in Prilosec. This medication be discontinued given concerns for C. Difficile 7. Patient being discharged back to [facility] skilled nursing facility 8. Patient will continue with chronic Foley catheter which was in prior to admission. 9. Patient will continue with flexiseal rectal pouch under her stools have become more solid 10. Patient will need follow-up thyroid function studies done in 6 weeks 11. Patient needs to be on an air mattress overlay 12. Wound care instructions: Skin care to buttocks, perineal area and bilateral thighs affected by contact dermatitis:  Cleanse with bedside care bathing solution, apply Sween 24 moisturizer and top with Critic Aid clear moisture barrier once daily and PRN immediately following any episodes of incontinence not contained by indwelling bowel management system or urinary catheter.  Turn and reposition every two hours and keep HOB at or below a 30 degree Further wound care: Skin care for intertriginous dermatitis between skin folds of abdomen and beneath bilateral breasts (inframammory areas):  Use InterDry AG+  per the instructions below: Measure and cut length of InterDry Ag+ to fit in skin folds that have skin breakdown Tuck InterDry  Ag+ fabric into skin folds in a  single layer, allow for 2 inches of overhang from skin edges to allow for wicking to occur May remove to bathe; dry area thoroughly and then tuck into affected areas again  Do not apply any creams or ointments when using InterDry Ag+ DO NOT THROW AWAY FOR 5 DAYS unless soiled with stool DO NOT [facility] product, this will inactivate the silver in the material  New sheet of Interdry Ag+ should be applied after 5 days of use if patient continues to have skin breakdown    Discharge Diagnoses:  Active Hospital Problems   Diagnosis Date Noted  . Severe sepsis with septic shock 03/28/2014  . Decubitus ulcer of sacral region, unstageable 03/28/2014  . Diarrhea 03/28/2014  . Hypothyroid 03/28/2014  . Depression 03/28/2014  . ARF (acute renal failure) 03/28/2014  . Acute respiratory failure 03/27/2014  . Encephalopathy acute 10/22/2013  . Morbid obesity 10/22/2013  . Diabetes mellitus without complication   . Hypertension   . Paraplegic spinal paralysis     Resolved Hospital Problems   Diagnosis Date Noted Date Resolved  No resolved problems to display.    Discharge Condition: Improved, being discharged to skilled nursing  Diet recommendation: Carb modified  Filed Weights   03/27/14 1600 03/28/14 0500 03/29/14 0725  Weight: 156.945 kg (346 lb) 159 kg (350 lb 8.5 oz) 156.945 kg (346 lb)    History of present illness:  69 year old female with history of hypothyroidism, diabetes mellitus and paraplegia who resides at a skilled nursing facility and was brought in on 12/31 for altered mental status and then became unresponsive. Patient was intubated and placed on the ventilator. She was noted to have a  white count of 31 and was hypotensive. Blood cultures were drawn and patient was placed in the ICU.  Hospital Course:  By the following day, white blood cell count had dropped from 31-19. Patient still somewhat hypotensive, but as the day progressed became more awake today point where she became  agitated and removed her IVs. Given her degree of alertness, she was able to be extubated. Patient stated that no one had been checking on her at her skilled nursing facility allegedly and she been having problems with continued diarrhea. C. difficile PCR was done on admission which was negative, however patient is noted to have foul-smelling very liquidy brown stool. She is also noted to have significant decubitus and skin breakdown on her backside. With some difficulty, surgery able to place a femoral line. Patient remained confused. Easily agitated. Able to be transferred to floor. Second C. difficile PCR negative. Tachycardia had resolved at that point. Patient has slowly become more agitated and delirious, suspect she is in manic portion of bipolar disorder and started on Seroquel on evening of 1/3.  Principal Problem:  Severe sepsis with septic shock with acute respiratory failure and encephalopathy: Initial shock, encephalopathy and respiratory failure resolved. Suspected that the underlying cause given markedly elevated white blood cell count and diarrhea, this was C. difficile. However repeat C. difficile PCR negative. We'll stop by mouth vancomycin and case discussed with infectious disease. Check stool cultures. Given low-grade temps, chest x-ray repeated which was unrevealing so patient started on by mouth Flagyl and has remained afebrile since. At this time, we'll discontinue broad-spectrum IV antibiotics after 6 days and continue by mouth Flagyl treating source of infection is bacterial got causing diarrhea, possibly C. difficile  Active Problems:  Paraplegic spinal paralysis: Continue air mattress   Hypertension: Following resolution of septic shock, pressure started to trend up. Patient's antihypertensive slowly resumed. She will continue Lasix and Coreg and lisinopril as she was previously taking after discharge.   Diabetes mellitus without complication: Sliding scale only. CBG  states stable during this admission. A1c done at 5.9.  Bipolar disorder: Suspect she is in acute mania phase and delirious from this. Have started nightly Seroquel and followed with daily EKGs which noted no QT prolongation. Patient more withdrawn so upon discharge, will decrease to 7.5 daily at bedtime  Acute encephalopathy: Patient suspected underlying dementia and bipolar disorder.  Morbid obesity: Patient is criteria with BMI greater than 40   Decubitus ulcer of sacral region, unstageable: Appreciate wound care's help. Does not appear to be the actual source of her septic shock. Wound care as above   Diarrhea: C. difficile negative. Stool cultures no growth to date. Given no other source found and patient has been on intermittent antibiotics prior to admission, would go ahead and treat this as a bacterial diarrheal infection, possibly C. difficile and patient be discharged on 13 more days of by mouth Flagyl every 8 hours   Hypothyroid: Resuming Synthroid. TSH noted to be moderately suppressed, checking free T4 and free T3. Given agitation, tachycardia, diarrhea, delirium-initially was concerned about are starting, however things improved too quickly for that. Lab work only notes normal free T4 and low free T3, likely subacute hyperthyroidism if that. Would favor when she is stable, repeat labs in 1-2 months   Depression: Resuming Wellbutrin  Acute renal failure: Secondary to septic shock with hypotension, resolved with IV fluids   Procedures:  Placement central line 1/1  Intubation 12/31-1/1  Consultations:  Pulmonary critical care  Case  discussed with infectious disease  Discharge Exam: BP 154/86 mmHg  Pulse 57  Temp(Src) 99.9 F (37.7 C) (Oral)  Resp 20  Ht 5\' 10"  (1.778 m)  Wt 156.945 kg (346 lb)  BMI 49.65 kg/m2  SpO2 98%  General: With drawn, no acute distress Cardiovascular: Regular rate and rhythm, S1-S2 Respiratory: Clear to auscultation  bilaterally  Discharge Instructions You were cared for by a hospitalist during your hospital stay. If you have any questions about your discharge medications or the care you received while you were in the hospital after you are discharged, you can call the unit and asked to speak with the hospitalist on call if the hospitalist that took care of you is not available. Once you are discharged, your primary care physician will handle any further medical issues. Please note that NO REFILLS for any discharge medications will be authorized once you are discharged, as it is imperative that you return to your primary care physician (or establish a relationship with a primary care physician if you do not have one) for your aftercare needs so that they can reassess your need for medications and monitor your lab values.     Medication List    STOP taking these medications        clonazePAM 1 MG tablet  Commonly known as:  KLONOPIN     GLUCOSAMINE CHONDR COMPLEX PO     multivitamin with minerals Tabs tablet     omeprazole 20 MG capsule  Commonly known as:  PRILOSEC     oxyCODONE 15 MG immediate release tablet  Commonly known as:  ROXICODONE     OxyCODONE HCl 7.5 MG Taba  Replaced by:  oxycodone 5 MG capsule     polyethylene glycol powder powder  Commonly known as:  GLYCOLAX/MIRALAX     potassium chloride 10 MEQ tablet  Commonly known as:  K-DUR     pseudoephedrine-guaifenesin 60-600 MG per tablet  Commonly known as:  MUCINEX D     pyridOXINE 100 MG tablet  Commonly known as:  VITAMIN B-6     rizatriptan 10 MG tablet  Commonly known as:  MAXALT     sulfamethoxazole-trimethoprim 800-160 MG per tablet  Commonly known as:  BACTRIM DS,SEPTRA DS      TAKE these medications        acetaminophen 500 MG tablet  Commonly known as:  TYLENOL  Take 500 mg by mouth every 6 (six) hours as needed for pain.     acidophilus Caps capsule  Take 1 capsule by mouth daily.     alendronate 70 MG  tablet  Commonly known as:  FOSAMAX  Take 70 mg by mouth once a week. Take with a full glass of water on an empty stomach.     alum & mag hydroxide-simeth 200-200-20 MG/5ML suspension  Commonly known as:  MAALOX/MYLANTA  Take 30 mLs by mouth every 4 (four) hours as needed for indigestion or heartburn.     aspirin EC 81 MG tablet  Take 81 mg by mouth daily.     baclofen 10 MG tablet  Commonly known as:  LIORESAL  Take 10 mg by mouth 2 (two) times daily.     buPROPion 150 MG 24 hr tablet  Commonly known as:  WELLBUTRIN XL  Take 150 mg by mouth daily.     buPROPion 300 MG 24 hr tablet  Commonly known as:  WELLBUTRIN XL  Take 300 mg by mouth daily.     calcium citrate-vitamin D  500-400 MG-UNIT chewable tablet  Chew 1 tablet by mouth 2 (two) times daily.     carvedilol 12.5 MG tablet  Commonly known as:  COREG  Take 12.5 mg by mouth 2 (two) times daily with a meal.     cetirizine 10 MG tablet  Commonly known as:  ZYRTEC  Take 10 mg by mouth daily.     Cranberry 475 MG Caps  Take 1 capsule by mouth daily.     diclofenac 50 MG EC tablet  Commonly known as:  VOLTAREN  Take 50 mg by mouth 2 (two) times daily.     ezetimibe 10 MG tablet  Commonly known as:  ZETIA  Take 10 mg by mouth daily.     feeding supplement (PRO-STAT SUGAR FREE 64) Liqd  Take 30 mLs by mouth 3 (three) times daily with meals.     fentaNYL 25 MCG/HR patch  Commonly known as:  DURAGESIC - dosed mcg/hr  Place 1 patch (25 mcg total) onto the skin every 3 (three) days.     furosemide 40 MG tablet  Commonly known as:  LASIX  Take 40 mg by mouth daily.     gabapentin 300 MG capsule  Commonly known as:  NEURONTIN  Take 300 mg by mouth 3 (three) times daily.     ipratropium-albuterol 0.5-2.5 (3) MG/3ML Soln  Commonly known as:  DUONEB  Take 3 mLs by nebulization every 6 (six) hours as needed (shortness of breath).     levothyroxine 25 MCG tablet  Commonly known as:  SYNTHROID, LEVOTHROID  Take 25  mcg by mouth daily. Takes with Synthroid (Levothyroxine) 239mcg for a total of 226mcg daily at 6:00am     levothyroxine 200 MCG tablet  Commonly known as:  SYNTHROID, LEVOTHROID  Take 200 mcg by mouth daily. Takes with Synthroid (Levothyroxine) 59mcg for a total of 21mcg daily at 6:00am     lisinopril 5 MG tablet  Commonly known as:  PRINIVIL,ZESTRIL  Take 5 mg by mouth daily.     LORazepam 0.5 MG tablet  Commonly known as:  ATIVAN  Take 0.5 tablets (0.25 mg total) by mouth every 6 (six) hours as needed for anxiety.     metroNIDAZOLE 500 MG tablet  Commonly known as:  FLAGYL  Take 1 tablet (500 mg total) by mouth 3 (three) times daily.     montelukast 10 MG tablet  Commonly known as:  SINGULAIR  Take 10 mg by mouth daily.     oxycodone 5 MG capsule  Commonly known as:  OXY-IR  Take 1 capsule (5 mg total) by mouth every 6 (six) hours as needed.     POLY-IRON 150 150 MG capsule  Generic drug:  iron polysaccharides  Take 150 mg by mouth daily.     promethazine 12.5 MG tablet  Commonly known as:  PHENERGAN  Take 12.5 mg by mouth every 6 (six) hours as needed for nausea or vomiting.     PROVENTIL HFA 108 (90 BASE) MCG/ACT inhaler  Generic drug:  albuterol  Inhale 2 puffs into the lungs every 8 (eight) hours as needed (Asthma).     QUEtiapine 25 MG tablet  Commonly known as:  SEROQUEL  Take 0.5 tablets (12.5 mg total) by mouth at bedtime.     SEA-OMEGA PO  Take 1 capsule by mouth 2 (two) times daily.     tiZANidine 2 MG tablet  Commonly known as:  ZANAFLEX  Take 2 mg by mouth every 6 (six) hours as  needed for muscle spasms.       Allergies  Allergen Reactions  . Codeine   . Propranolol Hcl       The results of significant diagnostics from this hospitalization (including imaging, microbiology, ancillary and laboratory) are listed below for reference.    Significant Diagnostic Studies: Ct Head Wo Contrast  03/27/2014   CLINICAL DATA:  Hypoxia; unresponsive   EXAM: CT HEAD WITHOUT CONTRAST  TECHNIQUE: Contiguous axial images were obtained from the base of the skull through the vertex without intravenous contrast.  COMPARISON:  October 22, 2013  FINDINGS: A degree of motion artifact makes this study less than optimal. Ventricles are normal in size and configuration. There is no apparent intracranial mass, hemorrhage, extra-axial fluid collection, or midline shift. No focal gray-white compartment lesions are identified. No acute infarct is apparent. The bony calvarium appears intact. The mastoid air cells are clear. Endotracheal tube is present.  IMPRESSION: Motion artifact makes this study somewhat less than optimal. No intracranial mass or hemorrhage is appreciable. No extra-axial fluid collection is seen. The gray-white compartments appear unremarkable allowing for some degradation due to motion artifact. No acute infarct appreciable on this study.   Electronically Signed   By: Lowella Grip M.D.   On: 03/27/2014 15:05   Dg Chest Port 1 View  03/31/2014   CLINICAL DATA:  Fever.  Cough.  Possible aspiration.  EXAM: PORTABLE CHEST - 1 VIEW  COMPARISON:  03/28/2014  FINDINGS: Low lung volumes are demonstrated. Diffuse pulmonary vascular congestion again demonstrated. No focal areas of pulmonary consolidation seen. No evidence of pleural effusion. Heart size is stable.  IMPRESSION: Low lung volumes and diffuse pulmonary vascular congestion. No focal consolidation or pleural effusion.   Electronically Signed   By: Earle Gell M.D.   On: 03/31/2014 16:33   Dg Chest Port 1 View  03/28/2014   CLINICAL DATA:  Respiratory failure.  EXAM: PORTABLE CHEST - 1 VIEW  COMPARISON:  March 27, 2014.  FINDINGS: Endotracheal and nasogastric tubes have been removed. No pneumothorax or pleural effusion is noted. Stable cardiomediastinal silhouette. Stable mild central pulmonary vascular congestion. Stable minimal bibasilar subsegmental atelectasis is noted. Bony thorax is intact.   IMPRESSION: Stable mild central pulmonary vascular congestion. Stable minimal bibasilar subsegmental atelectasis.   Electronically Signed   By: Sabino Dick M.D.   On: 03/28/2014 21:07   Dg Chest Portable 1 View  03/27/2014   CLINICAL DATA:  Loss of consciousness.  Unresponsive.  EXAM: PORTABLE CHEST - 1 VIEW  COMPARISON:  10/22/2013  FINDINGS: The endotracheal tube is 5 cm above carina. The NG tube is coursing down the esophagus into the stomach. Low lung volumes with vascular crowding atelectasis. There is tortuosity of the thoracic aorta.  IMPRESSION: Support apparatus in good position without complicating features.  Low lung volumes with vascular crowding and atelectasis.   Electronically Signed   By: Kalman Jewels M.D.   On: 03/27/2014 13:48    Microbiology: Recent Results (from the past 240 hour(s))  Blood culture (routine x 2)     Status: None   Collection Time: 03/27/14  1:55 PM  Result Value Ref Range Status   Specimen Description BLOOD RIGHT HAND  Final   Special Requests BOTTLES DRAWN AEROBIC ONLY [place]  Final   Culture NO GROWTH 5 DAYS  Final   Report Status 04/01/2014 FINAL  Final  Urine culture     Status: None   Collection Time: 03/27/14  3:05 PM  Result Value Ref  Range Status   Specimen Description URINE, CATHETERIZED  Final   Special Requests NONE  Final   Colony Count NO GROWTH Performed at [facility]   Final   Culture NO GROWTH Performed at [facility]   Final   Report Status 03/30/2014 FINAL  Final  Blood culture (routine x 2)     Status: None   Collection Time: 03/27/14  3:11 PM  Result Value Ref Range Status   Specimen Description BLOOD LEFT HAND  Final   Special Requests   Final    BOTTLES DRAWN AEROBIC AND ANAEROBIC AEB=5CC ANA=4CC   Culture NO GROWTH 5 DAYS  Final   Report Status 04/01/2014 FINAL  Final  MRSA PCR Screening     Status: Abnormal   Collection Time: 03/27/14  4:30 PM  Result Value Ref Range Status   MRSA by PCR POSITIVE  (A) NEGATIVE Final    Comment:        The GeneXpert MRSA Assay (FDA approved for NASAL specimens only), is one component of a comprehensive MRSA colonization surveillance program. It is not intended to diagnose MRSA infection nor to guide or monitor treatment for MRSA infections. RESULT CALLED TO, READ BACK BY AND VERIFIED WITH: DANIELS,J. AT 1933 ON 03/27/2014 BY BAUGHAM,M.   Clostridium Difficile by PCR     Status: None   Collection Time: 03/27/14  5:00 PM  Result Value Ref Range Status   C difficile by pcr NEGATIVE NEGATIVE Final  Clostridium Difficile by PCR     Status: None   Collection Time: 03/29/14  5:50 AM  Result Value Ref Range Status   C difficile by pcr NEGATIVE NEGATIVE Final  Stool culture     Status: None (Preliminary result)   Collection Time: 03/30/14  3:58 PM  Result Value Ref Range Status   Specimen Description STOOL  Final   Special Requests NONE  Final   Culture   Final    NO SUSPICIOUS COLONIES, CONTINUING TO HOLD Performed at [facility]    Report Status PENDING  Incomplete     Labs: Basic Metabolic Panel:  Recent Labs Lab 03/29/14 0429 03/29/14 1257 03/30/14 0829 03/31/14 0636 04/01/14 0634  NA 144 140 142 143 147*  K 2.4* 2.8* 2.8* 2.8* 3.0*  CL 115* 113* 113* 115* 118*  CO2 22 23 23 21 21   GLUCOSE 130* 107* 92 102* 133*  BUN 20 15 10 7 9   CREATININE 0.54 0.46* 0.42* 0.51 0.71  CALCIUM 7.9* 7.4* 8.0* 8.1* 7.8*  MG  --   --  1.6  --   --    Liver Function Tests:  Recent Labs Lab 03/27/14 1250 03/28/14 0521  AST 46* 35  ALT 31 21  ALKPHOS 128* 79  BILITOT 0.6 0.6  PROT 7.6 6.1  ALBUMIN 3.7 2.9*   No results for input(s): LIPASE, AMYLASE in the last 168 hours. No results for input(s): AMMONIA in the last 168 hours. CBC:  Recent Labs Lab 03/27/14 1250 03/28/14 0521 03/29/14 0429 03/30/14 0829 04/01/14 0634  WBC 31.4* 19.3* 13.1* 8.6 8.9  NEUTROABS 27.3*  --   --   --   --   HGB 12.3 12.6 10.5* 9.7* 10.0*   HCT 41.5 40.8 32.8* 30.6* 31.0*  MCV 87.4 87.0 85.6 85.7 85.2  PLT 404* 259 217 179 167   Cardiac Enzymes:  Recent Labs Lab 03/27/14 1250  TROPONINI <0.03   BNP: BNP (last 3 results) No results for input(s): PROBNP in  the last 8760 hours. CBG:  Recent Labs Lab 03/31/14 1148 03/31/14 1639 03/31/14 2113 04/01/14 0735 04/01/14 1123  GLUCAP 155* 115* 98 131* 110*       Signed:  Tad Fancher K  [facility] 04/01/2014, 2:32 PM

## 2014-04-01 NOTE — Progress Notes (Signed)
Patient with orders to be discharged to [facility]. Report called to Nurse. Discharge packet sent with patient. Patient stable. Patient transported via EMS.

## 2014-04-01 NOTE — Clinical Social Work Note (Signed)
Pt d/c today back to [facility]. CSW notified pt's daughter, [name] by [employer]. Debbie at facility aware of foley and flexiseal. D/C summary faxed. Pt to transfer via [facility].  Benay Pike, [place]

## 2014-04-02 DIAGNOSIS — L259 Unspecified contact dermatitis, unspecified cause: Secondary | ICD-10-CM | POA: Diagnosis not present

## 2014-04-02 DIAGNOSIS — E876 Hypokalemia: Secondary | ICD-10-CM | POA: Diagnosis not present

## 2014-04-02 DIAGNOSIS — L8995 Pressure ulcer of unspecified site, unstageable: Secondary | ICD-10-CM | POA: Diagnosis not present

## 2014-04-02 DIAGNOSIS — R197 Diarrhea, unspecified: Secondary | ICD-10-CM | POA: Diagnosis not present

## 2014-04-02 DIAGNOSIS — N183 Chronic kidney disease, stage 3 (moderate): Secondary | ICD-10-CM | POA: Diagnosis not present

## 2014-04-02 DIAGNOSIS — N390 Urinary tract infection, site not specified: Secondary | ICD-10-CM | POA: Diagnosis not present

## 2014-04-02 DIAGNOSIS — R401 Stupor: Secondary | ICD-10-CM | POA: Diagnosis not present

## 2014-04-02 LAB — STOOL CULTURE

## 2014-04-02 NOTE — Progress Notes (Signed)
UR chart review completed.  

## 2014-04-03 DIAGNOSIS — N183 Chronic kidney disease, stage 3 (moderate): Secondary | ICD-10-CM | POA: Diagnosis not present

## 2014-04-03 DIAGNOSIS — R197 Diarrhea, unspecified: Secondary | ICD-10-CM | POA: Diagnosis not present

## 2014-04-03 DIAGNOSIS — E876 Hypokalemia: Secondary | ICD-10-CM | POA: Diagnosis not present

## 2014-04-03 DIAGNOSIS — L8995 Pressure ulcer of unspecified site, unstageable: Secondary | ICD-10-CM | POA: Diagnosis not present

## 2014-04-03 DIAGNOSIS — R401 Stupor: Secondary | ICD-10-CM | POA: Diagnosis not present

## 2014-04-03 DIAGNOSIS — N390 Urinary tract infection, site not specified: Secondary | ICD-10-CM | POA: Diagnosis not present

## 2014-04-04 DIAGNOSIS — E876 Hypokalemia: Secondary | ICD-10-CM | POA: Diagnosis not present

## 2014-04-04 DIAGNOSIS — N183 Chronic kidney disease, stage 3 (moderate): Secondary | ICD-10-CM | POA: Diagnosis not present

## 2014-04-04 DIAGNOSIS — R197 Diarrhea, unspecified: Secondary | ICD-10-CM | POA: Diagnosis not present

## 2014-04-04 DIAGNOSIS — N390 Urinary tract infection, site not specified: Secondary | ICD-10-CM | POA: Diagnosis not present

## 2014-04-04 DIAGNOSIS — R401 Stupor: Secondary | ICD-10-CM | POA: Diagnosis not present

## 2014-04-04 DIAGNOSIS — L8995 Pressure ulcer of unspecified site, unstageable: Secondary | ICD-10-CM | POA: Diagnosis not present

## 2014-04-08 DIAGNOSIS — W06XXXA Fall from bed, initial encounter: Secondary | ICD-10-CM | POA: Diagnosis not present

## 2014-04-08 DIAGNOSIS — E46 Unspecified protein-calorie malnutrition: Secondary | ICD-10-CM | POA: Diagnosis not present

## 2014-04-08 DIAGNOSIS — B009 Herpesviral infection, unspecified: Secondary | ICD-10-CM | POA: Diagnosis not present

## 2014-04-08 DIAGNOSIS — H04213 Epiphora due to excess lacrimation, bilateral lacrimal glands: Secondary | ICD-10-CM | POA: Diagnosis not present

## 2014-04-09 DIAGNOSIS — L259 Unspecified contact dermatitis, unspecified cause: Secondary | ICD-10-CM | POA: Diagnosis not present

## 2014-04-15 DIAGNOSIS — E785 Hyperlipidemia, unspecified: Secondary | ICD-10-CM | POA: Diagnosis not present

## 2014-04-15 DIAGNOSIS — R739 Hyperglycemia, unspecified: Secondary | ICD-10-CM | POA: Diagnosis not present

## 2014-04-16 DIAGNOSIS — L259 Unspecified contact dermatitis, unspecified cause: Secondary | ICD-10-CM | POA: Diagnosis not present

## 2014-04-21 DIAGNOSIS — N390 Urinary tract infection, site not specified: Secondary | ICD-10-CM | POA: Diagnosis not present

## 2014-04-22 DIAGNOSIS — F419 Anxiety disorder, unspecified: Secondary | ICD-10-CM | POA: Diagnosis not present

## 2014-04-22 DIAGNOSIS — F3130 Bipolar disorder, current episode depressed, mild or moderate severity, unspecified: Secondary | ICD-10-CM | POA: Diagnosis not present

## 2014-04-23 DIAGNOSIS — L259 Unspecified contact dermatitis, unspecified cause: Secondary | ICD-10-CM | POA: Diagnosis not present

## 2014-05-01 DIAGNOSIS — K5900 Constipation, unspecified: Secondary | ICD-10-CM | POA: Diagnosis not present

## 2014-05-01 DIAGNOSIS — M129 Arthropathy, unspecified: Secondary | ICD-10-CM | POA: Diagnosis not present

## 2014-05-01 DIAGNOSIS — H04123 Dry eye syndrome of bilateral lacrimal glands: Secondary | ICD-10-CM | POA: Diagnosis not present

## 2014-05-01 DIAGNOSIS — M109 Gout, unspecified: Secondary | ICD-10-CM | POA: Diagnosis not present

## 2014-05-02 DIAGNOSIS — H04123 Dry eye syndrome of bilateral lacrimal glands: Secondary | ICD-10-CM | POA: Diagnosis not present

## 2014-05-02 DIAGNOSIS — M109 Gout, unspecified: Secondary | ICD-10-CM | POA: Diagnosis not present

## 2014-05-02 DIAGNOSIS — M129 Arthropathy, unspecified: Secondary | ICD-10-CM | POA: Diagnosis not present

## 2014-05-02 DIAGNOSIS — K5900 Constipation, unspecified: Secondary | ICD-10-CM | POA: Diagnosis not present

## 2014-05-05 DIAGNOSIS — R0902 Hypoxemia: Secondary | ICD-10-CM | POA: Diagnosis not present

## 2014-05-05 DIAGNOSIS — R000 Tachycardia, unspecified: Secondary | ICD-10-CM | POA: Diagnosis not present

## 2014-05-05 DIAGNOSIS — I959 Hypotension, unspecified: Secondary | ICD-10-CM | POA: Diagnosis not present

## 2014-05-05 DIAGNOSIS — S134XXA Sprain of ligaments of cervical spine, initial encounter: Secondary | ICD-10-CM | POA: Diagnosis not present

## 2014-05-05 DIAGNOSIS — S29019A Strain of muscle and tendon of unspecified wall of thorax, initial encounter: Secondary | ICD-10-CM | POA: Diagnosis not present

## 2014-05-05 DIAGNOSIS — R401 Stupor: Secondary | ICD-10-CM | POA: Diagnosis not present

## 2014-05-06 DIAGNOSIS — R0902 Hypoxemia: Secondary | ICD-10-CM | POA: Diagnosis not present

## 2014-05-06 DIAGNOSIS — R000 Tachycardia, unspecified: Secondary | ICD-10-CM | POA: Diagnosis not present

## 2014-05-06 DIAGNOSIS — I959 Hypotension, unspecified: Secondary | ICD-10-CM | POA: Diagnosis not present

## 2014-05-06 DIAGNOSIS — R401 Stupor: Secondary | ICD-10-CM | POA: Diagnosis not present

## 2014-05-06 DIAGNOSIS — S29019A Strain of muscle and tendon of unspecified wall of thorax, initial encounter: Secondary | ICD-10-CM | POA: Diagnosis not present

## 2014-05-06 DIAGNOSIS — S134XXA Sprain of ligaments of cervical spine, initial encounter: Secondary | ICD-10-CM | POA: Diagnosis not present

## 2014-05-07 ENCOUNTER — Inpatient Hospital Stay (HOSPITAL_COMMUNITY)
Admission: EM | Admit: 2014-05-07 | Discharge: 2014-05-20 | DRG: 853 | Disposition: A | Discharge status: Skilled Nursing Facility | Payer: Medicare Other | Attending: Internal Medicine | Admitting: Internal Medicine

## 2014-05-07 ENCOUNTER — Emergency Department (HOSPITAL_COMMUNITY): Payer: Medicare Other

## 2014-05-07 ENCOUNTER — Encounter (HOSPITAL_COMMUNITY): Payer: Self-pay

## 2014-05-07 DIAGNOSIS — D72829 Elevated white blood cell count, unspecified: Secondary | ICD-10-CM | POA: Diagnosis not present

## 2014-05-07 DIAGNOSIS — Z885 Allergy status to narcotic agent status: Secondary | ICD-10-CM | POA: Diagnosis not present

## 2014-05-07 DIAGNOSIS — J9600 Acute respiratory failure, unspecified whether with hypoxia or hypercapnia: Secondary | ICD-10-CM | POA: Diagnosis not present

## 2014-05-07 DIAGNOSIS — I10 Essential (primary) hypertension: Secondary | ICD-10-CM | POA: Diagnosis present

## 2014-05-07 DIAGNOSIS — R0902 Hypoxemia: Secondary | ICD-10-CM | POA: Diagnosis not present

## 2014-05-07 DIAGNOSIS — Z888 Allergy status to other drugs, medicaments and biological substances status: Secondary | ICD-10-CM | POA: Diagnosis not present

## 2014-05-07 DIAGNOSIS — G8220 Paraplegia, unspecified: Secondary | ICD-10-CM | POA: Diagnosis not present

## 2014-05-07 DIAGNOSIS — B999 Unspecified infectious disease: Secondary | ICD-10-CM | POA: Diagnosis not present

## 2014-05-07 DIAGNOSIS — G8929 Other chronic pain: Secondary | ICD-10-CM | POA: Diagnosis not present

## 2014-05-07 DIAGNOSIS — L739 Follicular disorder, unspecified: Secondary | ICD-10-CM | POA: Diagnosis present

## 2014-05-07 DIAGNOSIS — M5136 Other intervertebral disc degeneration, lumbar region: Secondary | ICD-10-CM | POA: Diagnosis not present

## 2014-05-07 DIAGNOSIS — M869 Osteomyelitis, unspecified: Secondary | ICD-10-CM | POA: Diagnosis not present

## 2014-05-07 DIAGNOSIS — M4646 Discitis, unspecified, lumbar region: Secondary | ICD-10-CM | POA: Diagnosis not present

## 2014-05-07 DIAGNOSIS — A419 Sepsis, unspecified organism: Secondary | ICD-10-CM

## 2014-05-07 DIAGNOSIS — R251 Tremor, unspecified: Secondary | ICD-10-CM | POA: Diagnosis present

## 2014-05-07 DIAGNOSIS — R6521 Severe sepsis with septic shock: Secondary | ICD-10-CM | POA: Diagnosis not present

## 2014-05-07 DIAGNOSIS — E119 Type 2 diabetes mellitus without complications: Secondary | ICD-10-CM | POA: Diagnosis present

## 2014-05-07 DIAGNOSIS — M5186 Other intervertebral disc disorders, lumbar region: Secondary | ICD-10-CM | POA: Diagnosis not present

## 2014-05-07 DIAGNOSIS — K5900 Constipation, unspecified: Secondary | ICD-10-CM | POA: Diagnosis present

## 2014-05-07 DIAGNOSIS — Z7982 Long term (current) use of aspirin: Secondary | ICD-10-CM

## 2014-05-07 DIAGNOSIS — M4804 Spinal stenosis, thoracic region: Secondary | ICD-10-CM | POA: Diagnosis not present

## 2014-05-07 DIAGNOSIS — M479 Spondylosis, unspecified: Secondary | ICD-10-CM | POA: Diagnosis present

## 2014-05-07 DIAGNOSIS — R401 Stupor: Secondary | ICD-10-CM | POA: Diagnosis not present

## 2014-05-07 DIAGNOSIS — G062 Extradural and subdural abscess, unspecified: Secondary | ICD-10-CM | POA: Diagnosis not present

## 2014-05-07 DIAGNOSIS — M549 Dorsalgia, unspecified: Secondary | ICD-10-CM | POA: Diagnosis not present

## 2014-05-07 DIAGNOSIS — A047 Enterocolitis due to Clostridium difficile: Secondary | ICD-10-CM | POA: Diagnosis not present

## 2014-05-07 DIAGNOSIS — J449 Chronic obstructive pulmonary disease, unspecified: Secondary | ICD-10-CM | POA: Diagnosis present

## 2014-05-07 DIAGNOSIS — M4300 Spondylolysis, site unspecified: Secondary | ICD-10-CM | POA: Diagnosis not present

## 2014-05-07 DIAGNOSIS — IMO0001 Reserved for inherently not codable concepts without codable children: Secondary | ICD-10-CM | POA: Diagnosis present

## 2014-05-07 DIAGNOSIS — Z960 Presence of urogenital implants: Secondary | ICD-10-CM | POA: Diagnosis present

## 2014-05-07 DIAGNOSIS — S29019A Strain of muscle and tendon of unspecified wall of thorax, initial encounter: Secondary | ICD-10-CM | POA: Diagnosis not present

## 2014-05-07 DIAGNOSIS — R197 Diarrhea, unspecified: Secondary | ICD-10-CM | POA: Diagnosis not present

## 2014-05-07 DIAGNOSIS — M109 Gout, unspecified: Secondary | ICD-10-CM | POA: Diagnosis present

## 2014-05-07 DIAGNOSIS — G894 Chronic pain syndrome: Secondary | ICD-10-CM | POA: Diagnosis present

## 2014-05-07 DIAGNOSIS — M47816 Spondylosis without myelopathy or radiculopathy, lumbar region: Secondary | ICD-10-CM | POA: Diagnosis not present

## 2014-05-07 DIAGNOSIS — E038 Other specified hypothyroidism: Secondary | ICD-10-CM | POA: Diagnosis present

## 2014-05-07 DIAGNOSIS — M4645 Discitis, unspecified, thoracolumbar region: Secondary | ICD-10-CM | POA: Diagnosis not present

## 2014-05-07 DIAGNOSIS — E079 Disorder of thyroid, unspecified: Secondary | ICD-10-CM | POA: Diagnosis not present

## 2014-05-07 DIAGNOSIS — N390 Urinary tract infection, site not specified: Secondary | ICD-10-CM | POA: Diagnosis not present

## 2014-05-07 DIAGNOSIS — B377 Candidal sepsis: Principal | ICD-10-CM | POA: Diagnosis present

## 2014-05-07 DIAGNOSIS — K219 Gastro-esophageal reflux disease without esophagitis: Secondary | ICD-10-CM | POA: Diagnosis present

## 2014-05-07 DIAGNOSIS — E669 Obesity, unspecified: Secondary | ICD-10-CM | POA: Diagnosis not present

## 2014-05-07 DIAGNOSIS — Z9889 Other specified postprocedural states: Secondary | ICD-10-CM | POA: Diagnosis not present

## 2014-05-07 DIAGNOSIS — M4626 Osteomyelitis of vertebra, lumbar region: Secondary | ICD-10-CM | POA: Diagnosis present

## 2014-05-07 DIAGNOSIS — N179 Acute kidney failure, unspecified: Secondary | ICD-10-CM | POA: Diagnosis not present

## 2014-05-07 DIAGNOSIS — E039 Hypothyroidism, unspecified: Secondary | ICD-10-CM | POA: Diagnosis not present

## 2014-05-07 DIAGNOSIS — Z90710 Acquired absence of both cervix and uterus: Secondary | ICD-10-CM | POA: Diagnosis not present

## 2014-05-07 DIAGNOSIS — L89150 Pressure ulcer of sacral region, unstageable: Secondary | ICD-10-CM | POA: Diagnosis not present

## 2014-05-07 DIAGNOSIS — Z978 Presence of other specified devices: Secondary | ICD-10-CM

## 2014-05-07 DIAGNOSIS — Z7983 Long term (current) use of bisphosphonates: Secondary | ICD-10-CM | POA: Diagnosis not present

## 2014-05-07 DIAGNOSIS — N12 Tubulo-interstitial nephritis, not specified as acute or chronic: Secondary | ICD-10-CM | POA: Diagnosis not present

## 2014-05-07 DIAGNOSIS — F319 Bipolar disorder, unspecified: Secondary | ICD-10-CM | POA: Diagnosis not present

## 2014-05-07 DIAGNOSIS — J45909 Unspecified asthma, uncomplicated: Secondary | ICD-10-CM | POA: Diagnosis present

## 2014-05-07 DIAGNOSIS — J189 Pneumonia, unspecified organism: Secondary | ICD-10-CM | POA: Diagnosis not present

## 2014-05-07 DIAGNOSIS — I953 Hypotension of hemodialysis: Secondary | ICD-10-CM

## 2014-05-07 DIAGNOSIS — S134XXA Sprain of ligaments of cervical spine, initial encounter: Secondary | ICD-10-CM | POA: Diagnosis not present

## 2014-05-07 DIAGNOSIS — M4806 Spinal stenosis, lumbar region: Secondary | ICD-10-CM | POA: Diagnosis not present

## 2014-05-07 DIAGNOSIS — Z79891 Long term (current) use of opiate analgesic: Secondary | ICD-10-CM | POA: Diagnosis not present

## 2014-05-07 DIAGNOSIS — R000 Tachycardia, unspecified: Secondary | ICD-10-CM | POA: Diagnosis not present

## 2014-05-07 DIAGNOSIS — Z79899 Other long term (current) drug therapy: Secondary | ICD-10-CM | POA: Diagnosis not present

## 2014-05-07 DIAGNOSIS — G061 Intraspinal abscess and granuloma: Secondary | ICD-10-CM | POA: Diagnosis present

## 2014-05-07 DIAGNOSIS — M4649 Discitis, unspecified, multiple sites in spine: Secondary | ICD-10-CM | POA: Diagnosis present

## 2014-05-07 DIAGNOSIS — B9689 Other specified bacterial agents as the cause of diseases classified elsewhere: Secondary | ICD-10-CM | POA: Diagnosis not present

## 2014-05-07 DIAGNOSIS — Z87891 Personal history of nicotine dependence: Secondary | ICD-10-CM | POA: Diagnosis not present

## 2014-05-07 DIAGNOSIS — F411 Generalized anxiety disorder: Secondary | ICD-10-CM | POA: Diagnosis present

## 2014-05-07 DIAGNOSIS — Z7289 Other problems related to lifestyle: Secondary | ICD-10-CM | POA: Diagnosis not present

## 2014-05-07 DIAGNOSIS — M4620 Osteomyelitis of vertebra, site unspecified: Secondary | ICD-10-CM | POA: Diagnosis not present

## 2014-05-07 DIAGNOSIS — I959 Hypotension, unspecified: Secondary | ICD-10-CM | POA: Insufficient documentation

## 2014-05-07 DIAGNOSIS — E785 Hyperlipidemia, unspecified: Secondary | ICD-10-CM | POA: Diagnosis not present

## 2014-05-07 DIAGNOSIS — G9340 Encephalopathy, unspecified: Secondary | ICD-10-CM | POA: Diagnosis not present

## 2014-05-07 DIAGNOSIS — E871 Hypo-osmolality and hyponatremia: Secondary | ICD-10-CM | POA: Diagnosis not present

## 2014-05-07 DIAGNOSIS — E069 Thyroiditis, unspecified: Secondary | ICD-10-CM | POA: Diagnosis not present

## 2014-05-07 DIAGNOSIS — F339 Major depressive disorder, recurrent, unspecified: Secondary | ICD-10-CM | POA: Diagnosis not present

## 2014-05-07 DIAGNOSIS — A0472 Enterocolitis due to Clostridium difficile, not specified as recurrent: Secondary | ICD-10-CM | POA: Diagnosis present

## 2014-05-07 DIAGNOSIS — T8351XD Infection and inflammatory reaction due to indwelling urinary catheter, subsequent encounter: Secondary | ICD-10-CM | POA: Diagnosis not present

## 2014-05-07 LAB — CBC WITH DIFFERENTIAL/PLATELET
BASOS PCT: 1 % (ref 0–1)
Basophils Absolute: 0.1 10*3/uL (ref 0.0–0.1)
Eosinophils Absolute: 0.1 10*3/uL (ref 0.0–0.7)
Eosinophils Relative: 0 % (ref 0–5)
HCT: 28.6 % — ABNORMAL LOW (ref 36.0–46.0)
HEMOGLOBIN: 9 g/dL — AB (ref 12.0–15.0)
LYMPHS ABS: 2.6 10*3/uL (ref 0.7–4.0)
Lymphocytes Relative: 12 % (ref 12–46)
MCH: 26.9 pg (ref 26.0–34.0)
MCHC: 31.5 g/dL (ref 30.0–36.0)
MCV: 85.6 fL (ref 78.0–100.0)
MONO ABS: 1.5 10*3/uL — AB (ref 0.1–1.0)
MONOS PCT: 7 % (ref 3–12)
Neutro Abs: 17.8 10*3/uL — ABNORMAL HIGH (ref 1.7–7.7)
Neutrophils Relative %: 80 % — ABNORMAL HIGH (ref 43–77)
Platelets: 331 10*3/uL (ref 150–400)
RBC: 3.34 MIL/uL — AB (ref 3.87–5.11)
RDW: 16.4 % — ABNORMAL HIGH (ref 11.5–15.5)
WBC: 22.1 10*3/uL — ABNORMAL HIGH (ref 4.0–10.5)

## 2014-05-07 LAB — I-STAT CG4 LACTIC ACID, ED
LACTIC ACID, VENOUS: 1.55 mmol/L (ref 0.5–2.0)
LACTIC ACID, VENOUS: 0.76 mmol/L (ref 0.5–2.0)

## 2014-05-07 LAB — COMPREHENSIVE METABOLIC PANEL
ALT: 27 U/L (ref 0–35)
ANION GAP: 10 (ref 5–15)
AST: 18 U/L (ref 0–37)
Albumin: 2.6 g/dL — ABNORMAL LOW (ref 3.5–5.2)
Alkaline Phosphatase: 211 U/L — ABNORMAL HIGH (ref 39–117)
BILIRUBIN TOTAL: 0.8 mg/dL (ref 0.3–1.2)
BUN: 64 mg/dL — AB (ref 6–23)
CALCIUM: 8.7 mg/dL (ref 8.4–10.5)
CHLORIDE: 100 mmol/L (ref 96–112)
CO2: 20 mmol/L (ref 19–32)
CREATININE: 1.32 mg/dL — AB (ref 0.50–1.10)
GFR, EST AFRICAN AMERICAN: 48 mL/min — AB (ref >90)
GFR, EST NON AFRICAN AMERICAN: 41 mL/min — AB (ref >90)
Glucose, Bld: 80 mg/dL (ref 70–99)
Potassium: 4.5 mmol/L (ref 3.5–5.1)
Sodium: 130 mmol/L — ABNORMAL LOW (ref 135–145)
Total Protein: 7 g/dL (ref 6.0–8.3)

## 2014-05-07 LAB — URINALYSIS, ROUTINE W REFLEX MICROSCOPIC
BILIRUBIN URINE: NEGATIVE
Glucose, UA: NEGATIVE mg/dL
Ketones, ur: NEGATIVE mg/dL
NITRITE: NEGATIVE
PH: 5.5 (ref 5.0–8.0)
SPECIFIC GRAVITY, URINE: 1.015 (ref 1.005–1.030)
UROBILINOGEN UA: 0.2 mg/dL (ref 0.0–1.0)

## 2014-05-07 LAB — URINE MICROSCOPIC-ADD ON

## 2014-05-07 LAB — TSH: TSH: 0.372 u[IU]/mL (ref 0.350–4.500)

## 2014-05-07 LAB — I-STAT CHEM 8, ED
BUN: 61 mg/dL — ABNORMAL HIGH (ref 6–23)
Calcium, Ion: 1.24 mmol/L (ref 1.13–1.30)
Chloride: 98 mmol/L (ref 96–112)
Creatinine, Ser: 1.5 mg/dL — ABNORMAL HIGH (ref 0.50–1.10)
Glucose, Bld: 82 mg/dL (ref 70–99)
HCT: 30 % — ABNORMAL LOW (ref 36.0–46.0)
Hemoglobin: 10.2 g/dL — ABNORMAL LOW (ref 12.0–15.0)
Potassium: 4.5 mmol/L (ref 3.5–5.1)
SODIUM: 134 mmol/L — AB (ref 135–145)
TCO2: 17 mmol/L (ref 0–100)

## 2014-05-07 LAB — TROPONIN I

## 2014-05-07 MED ORDER — EZETIMIBE 10 MG PO TABS
10.0000 mg | ORAL_TABLET | Freq: Every day | ORAL | Status: DC
  Administered 2014-05-08 – 2014-05-20 (×13): 10 mg via ORAL
  Filled 2014-05-07 (×13): qty 1

## 2014-05-07 MED ORDER — ACETAMINOPHEN 650 MG RE SUPP: 650.0000 mg | Freq: Four times a day (QID) | RECTAL | Status: DC | PRN

## 2014-05-07 MED ORDER — SODIUM CHLORIDE 0.9 % IV BOLUS (SEPSIS)
1000.0000 mL | Freq: Once | INTRAVENOUS | Status: AC
  Administered 2014-05-07: 1000 mL via INTRAVENOUS

## 2014-05-07 MED ORDER — QUETIAPINE FUMARATE 25 MG PO TABS
50.0000 mg | ORAL_TABLET | Freq: Every day | ORAL | Status: DC
  Administered 2014-05-09: 50 mg via ORAL
  Filled 2014-05-07: qty 2
  Filled 2014-05-07 (×2): qty 1
  Filled 2014-05-07: qty 2

## 2014-05-07 MED ORDER — LORAZEPAM 0.5 MG PO TABS
0.2500 mg | ORAL_TABLET | Freq: Four times a day (QID) | ORAL | Status: DC | PRN
  Administered 2014-05-09: 0.25 mg via ORAL
  Filled 2014-05-07: qty 1

## 2014-05-07 MED ORDER — QUETIAPINE 12.5 MG HALF TABLET: 12.5000 mg | ORAL_TABLET | Freq: Every day | ORAL | Status: DC

## 2014-05-07 MED ORDER — ONDANSETRON HCL 4 MG PO TABS: 4.0000 mg | ORAL_TABLET | Freq: Four times a day (QID) | ORAL | Status: DC | PRN

## 2014-05-07 MED ORDER — SODIUM CHLORIDE 0.9 % IV SOLN
INTRAVENOUS | Status: DC
  Administered 2014-05-07 – 2014-05-18 (×5): via INTRAVENOUS

## 2014-05-07 MED ORDER — BUPROPION HCL ER (XL) 150 MG PO TB24
150.0000 mg | ORAL_TABLET | Freq: Every day | ORAL | Status: DC
  Administered 2014-05-08 – 2014-05-20 (×12): 150 mg via ORAL
  Filled 2014-05-07 (×14): qty 1

## 2014-05-07 MED ORDER — BACLOFEN 10 MG PO TABS
10.0000 mg | ORAL_TABLET | Freq: Two times a day (BID) | ORAL | Status: DC
  Administered 2014-05-08 – 2014-05-20 (×24): 10 mg via ORAL
  Filled 2014-05-07 (×28): qty 1

## 2014-05-07 MED ORDER — TIZANIDINE HCL 4 MG PO TABS
2.0000 mg | ORAL_TABLET | Freq: Four times a day (QID) | ORAL | Status: DC | PRN
  Administered 2014-05-13 – 2014-05-19 (×8): 2 mg via ORAL
  Filled 2014-05-07 (×12): qty 1

## 2014-05-07 MED ORDER — ASPIRIN EC 81 MG PO TBEC
81.0000 mg | DELAYED_RELEASE_TABLET | Freq: Every day | ORAL | Status: DC
  Administered 2014-05-08 – 2014-05-20 (×13): 81 mg via ORAL
  Filled 2014-05-07 (×13): qty 1

## 2014-05-07 MED ORDER — CETYLPYRIDINIUM CHLORIDE 0.05 % MT LIQD
7.0000 mL | Freq: Two times a day (BID) | OROMUCOSAL | Status: DC
  Administered 2014-05-08 – 2014-05-12 (×8): 7 mL via OROMUCOSAL

## 2014-05-07 MED ORDER — LEVOTHYROXINE SODIUM 25 MCG PO TABS
25.0000 ug | ORAL_TABLET | Freq: Every day | ORAL | Status: DC
  Administered 2014-05-08 – 2014-05-20 (×13): 25 ug via ORAL
  Filled 2014-05-07 (×15): qty 1

## 2014-05-07 MED ORDER — IPRATROPIUM-ALBUTEROL 0.5-2.5 (3) MG/3ML IN SOLN: 3.0000 mL | Freq: Four times a day (QID) | RESPIRATORY_TRACT | Status: DC | PRN

## 2014-05-07 MED ORDER — VANCOMYCIN HCL IN DEXTROSE 1-5 GM/200ML-% IV SOLN
1000.0000 mg | Freq: Once | INTRAVENOUS | Status: AC
  Administered 2014-05-07: 1000 mg via INTRAVENOUS
  Filled 2014-05-07: qty 200

## 2014-05-07 MED ORDER — ONDANSETRON HCL 4 MG/2ML IJ SOLN
4.0000 mg | Freq: Four times a day (QID) | INTRAMUSCULAR | Status: DC | PRN
  Administered 2014-05-19: 4 mg via INTRAVENOUS
  Filled 2014-05-07: qty 2

## 2014-05-07 MED ORDER — GABAPENTIN 300 MG PO CAPS
300.0000 mg | ORAL_CAPSULE | Freq: Three times a day (TID) | ORAL | Status: DC
  Administered 2014-05-08 – 2014-05-09 (×5): 300 mg via ORAL
  Filled 2014-05-07 (×6): qty 1

## 2014-05-07 MED ORDER — LEVOTHYROXINE SODIUM 200 MCG PO TABS
200.0000 ug | ORAL_TABLET | Freq: Every day | ORAL | Status: DC
  Administered 2014-05-08 – 2014-05-20 (×13): 200 ug via ORAL
  Filled 2014-05-07 (×10): qty 1
  Filled 2014-05-07: qty 2
  Filled 2014-05-07 (×5): qty 1

## 2014-05-07 MED ORDER — SODIUM CHLORIDE 0.9 % IV BOLUS (SEPSIS)
1000.0000 mL | Freq: Once | INTRAVENOUS | Status: DC
  Administered 2014-05-07: 1000 mL via INTRAVENOUS

## 2014-05-07 MED ORDER — SODIUM CHLORIDE 0.9 % IV SOLN
1000.0000 mL | Freq: Once | INTRAVENOUS | Status: AC
  Administered 2014-05-07: 500 mL via INTRAVENOUS

## 2014-05-07 MED ORDER — ACETAMINOPHEN 325 MG PO TABS
650.0000 mg | ORAL_TABLET | Freq: Four times a day (QID) | ORAL | Status: DC | PRN
  Administered 2014-05-10: 650 mg via ORAL
  Administered 2014-05-12 – 2014-05-13 (×3): 325 mg via ORAL
  Administered 2014-05-13 – 2014-05-20 (×11): 650 mg via ORAL
  Filled 2014-05-07 (×16): qty 2

## 2014-05-07 MED ORDER — PIPERACILLIN-TAZOBACTAM 3.375 G IVPB 30 MIN
3.3750 g | Freq: Once | INTRAVENOUS | Status: AC
  Administered 2014-05-07: 3.375 g via INTRAVENOUS
  Filled 2014-05-07: qty 50

## 2014-05-07 MED ORDER — ENOXAPARIN SODIUM 40 MG/0.4ML ~~LOC~~ SOLN
40.0000 mg | SUBCUTANEOUS | Status: DC
  Administered 2014-05-08: 40 mg via SUBCUTANEOUS
  Filled 2014-05-07: qty 0.4

## 2014-05-07 NOTE — ED Notes (Signed)
Pt here from [facility] for evaluation of tremors. Pt states she has has these tremors for three days. When nurse at [facility], called report she said pt has altered mental status, tremors that started this morning and possible C- DIFF. Pt is alert and oriented. States she had a BM yesterday and one today

## 2014-05-07 NOTE — H&P (Addendum)
PCP:   [name], MD   Chief Complaint:  Tremors  HPI: 69 year old female who   has a past medical history of Paraplegic spinal paralysis; Hypertension; Bipolar 1 disorder; Thyroid disease; Diabetes mellitus without complication; Obesity; Pyelonephritis; COPD with asthma; and GERD (gastroesophageal reflux disease). Patient was brought from [place] home after she complained of jerking tremors to both upper extremities for past 3 days. In the ED patient was found to be hypotensive with blood pressure 82/52, also patient found to have abnormal UA and complained of auditory hallucinations. Patient started on vancomycin and Zosyn for sepsis due to UTI. Lactic acid was normal with 1.55 and repeat lactic acid 0.76. Patient did have high white count of 22.1. She complains of diarrhea for past few days. Denies nausea or vomiting. Patient is alert and oriented 3, able to answer all the questions she is mentating very well despite the low blood pressure. Abdominal x-ray showed advanced destructive changes in the L1-L2 level, with recommendation for MRI for possible osteoma mellitus/discitis for further evaluation. Patient has chronic Foley catheter, she was diagnosed with UTI recently on January 27.   Allergies:   Allergies  Allergen Reactions  . Codeine   . Propranolol Hcl       Past Medical History  Diagnosis Date  . Paraplegic spinal paralysis   . Hypertension   . Bipolar 1 disorder   . Thyroid disease   . Diabetes mellitus without complication   . Obesity   . Pyelonephritis   . COPD with asthma   . GERD (gastroesophageal reflux disease)     Past Surgical History  Procedure Laterality Date  . Back surgery    . Abdominal hysterectomy      Prior to Admission medications   Medication Sig Start Date End Date Taking? Authorizing Provider  acetaminophen (TYLENOL) 500 MG tablet Take 500 mg by mouth every 6 (six) hours as needed for pain.   Yes Historical Provider, MD    acidophilus (RISAQUAD) CAPS capsule Take 1 capsule by mouth daily.   Yes Historical Provider, MD  aspirin EC 81 MG tablet Take 81 mg by mouth daily.   Yes Historical Provider, MD  b complex vitamins tablet Take 1 tablet by mouth daily.   Yes Historical Provider, MD  baclofen (LIORESAL) 10 MG tablet Take 10 mg by mouth 2 (two) times daily.    Yes Historical Provider, MD  buPROPion (WELLBUTRIN XL) 150 MG 24 hr tablet Take 150 mg by mouth daily.   Yes Historical Provider, MD  calcium citrate-vitamin D 500-400 MG-UNIT Chew 1 tablet by mouth 2 (two) times daily.   Yes Historical Provider, MD  carvedilol (COREG) 12.5 MG tablet Take 12.5 mg by mouth 2 (two) times daily with a meal.   Yes Historical Provider, MD  cetirizine (ZYRTEC) 10 MG tablet Take 10 mg by mouth daily.   Yes Historical Provider, MD  colchicine 0.6 MG tablet Take 0.6 mg by mouth 2 (two) times daily.   Yes Historical Provider, MD  Cranberry 475 MG CAPS Take 1 capsule by mouth daily.   Yes Historical Provider, MD  dextromethorphan-guaiFENesin (MUCINEX DM) 30-600 MG per 12 hr tablet Take 1 tablet by mouth 2 (two) times daily as needed for cough.   Yes Historical Provider, MD  diphenhydrAMINE (BENADRYL) 12.5 MG chewable tablet Chew 12.5 mg by mouth 4 (four) times daily as needed for allergies.   Yes Historical Provider, MD  ezetimibe (ZETIA) 10 MG tablet Take 10 mg by mouth  daily.   Yes Historical Provider, MD  fentaNYL (DURAGESIC - DOSED MCG/HR) 25 MCG/HR patch Place 1 patch (25 mcg total) onto the skin every 3 (three) days. 04/01/14  Yes Annita Brod, MD  furosemide (LASIX) 40 MG tablet Take 40 mg by mouth daily.   Yes Historical Provider, MD  gabapentin (NEURONTIN) 300 MG capsule Take 300 mg by mouth 3 (three) times daily.   Yes Historical Provider, MD  hydroxypropyl methylcellulose / hypromellose (ISOPTO TEARS / GONIOVISC) 2.5 % ophthalmic solution Place 1 drop into both eyes as needed for dry eyes.   Yes Historical Provider, MD   iron polysaccharides (POLY-IRON 150) 150 MG capsule Take 150 mg by mouth daily.   Yes Historical Provider, MD  levothyroxine (SYNTHROID, LEVOTHROID) 200 MCG tablet Take 200 mcg by mouth daily. Takes with Synthroid (Levothyroxine) 25 mcg for a total of 240mcg daily at 6:00am   Yes Historical Provider, MD  levothyroxine (SYNTHROID, LEVOTHROID) 25 MCG tablet Take 25 mcg by mouth daily. Takes with Synthroid (Levothyroxine) 214mcg for a total of 28mcg daily at 6:00am   Yes Historical Provider, MD  lisinopril (PRINIVIL,ZESTRIL) 5 MG tablet Take 5 mg by mouth daily.   Yes Historical Provider, MD  montelukast (SINGULAIR) 10 MG tablet Take 10 mg by mouth daily.   Yes Historical Provider, MD  Omega-3 Fatty Acids (SEA-OMEGA PO) Take 1 capsule by mouth 2 (two) times daily.   Yes Historical Provider, MD  QUEtiapine (SEROQUEL) 50 MG tablet Take 50 mg by mouth at bedtime.   Yes Historical Provider, MD  senna-docusate (SENOKOT-S) 8.6-50 MG per tablet Take 1 tablet by mouth at bedtime.   Yes Historical Provider, MD  vitamin C (ASCORBIC ACID) 500 MG tablet Take 500 mg by mouth 2 (two) times daily.   Yes Historical Provider, MD  albuterol (PROVENTIL HFA) 108 (90 BASE) MCG/ACT inhaler Inhale 2 puffs into the lungs every 8 (eight) hours as needed (Asthma).     Historical Provider, MD  alendronate (FOSAMAX) 70 MG tablet Take 70 mg by mouth once a week. Take with a full glass of water on an empty stomach.    Historical Provider, MD  alum & mag hydroxide-simeth (MAALOX/MYLANTA) 200-200-20 MG/5ML suspension Take 30 mLs by mouth every 4 (four) hours as needed for indigestion or heartburn.    Historical Provider, MD  Amino Acids-Protein Hydrolys (FEEDING SUPPLEMENT, PRO-STAT SUGAR FREE 64,) LIQD Take 30 mLs by mouth 3 (three) times daily with meals. Patient not taking: Reported on 05/07/2014 04/01/14   Annita Brod, MD  ipratropium-albuterol (DUONEB) 0.5-2.5 (3) MG/3ML SOLN Take 3 mLs by nebulization every 6 (six) hours as  needed (shortness of breath).     Historical Provider, MD  LORazepam (ATIVAN) 0.5 MG tablet Take 0.5 tablets (0.25 mg total) by mouth every 6 (six) hours as needed for anxiety. 04/01/14   Annita Brod, MD  oxycodone (OXY-IR) 5 MG capsule Take 1 capsule (5 mg total) by mouth every 6 (six) hours as needed. 04/01/14   Annita Brod, MD  promethazine (PHENERGAN) 12.5 MG tablet Take 12.5 mg by mouth every 6 (six) hours as needed for nausea or vomiting.    Historical Provider, MD  QUEtiapine (SEROQUEL) 25 MG tablet Take 0.5 tablets (12.5 mg total) by mouth at bedtime. Patient not taking: Reported on 05/07/2014 04/01/14   Annita Brod, MD  tiZANidine (ZANAFLEX) 2 MG tablet Take 2 mg by mouth every 6 (six) hours as needed for muscle spasms.    Historical  Provider, MD    Social History:  reports that she has quit smoking. She does not have any smokeless tobacco history on file. She reports that she does not drink alcohol or use illicit drugs.  Family History  Problem Relation Age of Onset  . Heart disease Mother      All the positives are listed in BOLD  Review of Systems:  HEENT: Headache, blurred vision, runny nose, sore throat Neck: Hypothyroidism, hyperthyroidism,,lymphadenopathy Chest : Shortness of breath, history of COPD, Asthma Heart : Chest pain, history of coronary arterey disease GI:  Nausea, vomiting, diarrhea, constipation, GERD GU: Dysuria, urgency, frequency of urination, hematuria Neuro: Stroke, seizures, syncope Psych: Depression, anxiety, hallucinations   Physical Exam: Blood pressure 91/76, pulse 76, temperature 99.4 F (37.4 C), temperature source Rectal, resp. rate 26, SpO2 99 %. Constitutional:   Patient is a well-developed and well-nourished female* in no acute distress and cooperative with exam. Head: Normocephalic and atraumatic Mouth: Mucus membranes moist Eyes: PERRL, EOMI, conjunctivae normal Neck: Supple, No Thyromegaly Cardiovascular: RRR, S1 normal,  S2 normal Pulmonary/Chest: CTAB, no wheezes, rales, or rhonchi Abdominal: Soft. Non-tender, non-distended, bowel sounds are normal, no masses, organomegaly, or guarding present.  Neurological: A&O x3, Strength is normal and symmetric bilaterallyin the upper extremities , paraplegia, cranial nerve II-XII are grossly intact, no focal motor deficit, sensory intact to light touch bilaterally.  Extremities : No Cyanosis, Clubbing, trace edema bilaterally in the lower extremities. Mild tremors in the upper extremities.  Labs on Admission:  Basic Metabolic Panel:  Recent Labs Lab 05/07/14 1330 05/07/14 1559  NA 130* 134*  K 4.5 4.5  CL 100 98  CO2 20  --   GLUCOSE 80 82  BUN 64* 61*  CREATININE 1.32* 1.50*  CALCIUM 8.7  --    Liver Function Tests:  Recent Labs Lab 05/07/14 1330  AST 18  ALT 27  ALKPHOS 211*  BILITOT 0.8  PROT 7.0  ALBUMIN 2.6*   No results for input(s): LIPASE, AMYLASE in the last 168 hours. No results for input(s): AMMONIA in the last 168 hours. CBC:  Recent Labs Lab 05/07/14 1330 05/07/14 1559  WBC 22.1*  --   NEUTROABS 17.8*  --   HGB 9.0* 10.2*  HCT 28.6* 30.0*  MCV 85.6  --   PLT 331  --    Cardiac Enzymes:  Recent Labs Lab 05/07/14 1330  TROPONINI <0.03    BNP (last 3 results)  Recent Labs  03/29/14 0429  BNP 73.0    ProBNP (last 3 results) No results for input(s): PROBNP in the last 8760 hours.  CBG: No results for input(s): GLUCAP in the last 168 hours.  Radiological Exams on Admission: Dg Abd Acute W/chest  05/07/2014   CLINICAL DATA:  69 year old female with diaphoresis, diarrhea and hypotension.  EXAM: ACUTE ABDOMEN SERIES (ABDOMEN 2 VIEW & CHEST 1 VIEW)  COMPARISON:  Prior chest x-ray 03/31/2014 ; CT abdomen/pelvis 06/30/2006  FINDINGS: Cardiac and mediastinal contours are within normal limits. Bibasilar atelectasis. No focal airspace consolidation. No evidence of free air on the up right radiograph. Multiple radiographs  are used to encompass the entirety of the abdomen. Radiographs are limited by underpenetration secondary to patient body habitus. No evidence of small bowel obstruction. Moderate colonic stool burden. Advanced destructive changes at the L1-L2 level  IMPRESSION: 1. No acute cardiopulmonary process. 2. Normal, not obstructed bowel gas pattern. 3. Advanced destructive changes are noted at the L1-L2 level which represents an interval finding compared to a  remote CT scan of the abdomen from April of 2008. More precise acuity of these findings is limited without prior imaging for comparison. If there is clinical concern for osteomyelitis/discitis consider further evaluation with MRI.   Electronically Signed   By: Jacqulynn Cadet M.D.   On: 05/07/2014 17:07    EKG: Independently reviewed. Accelerated junctional rhythm   Assessment/Plan Active Problems:   Sepsis   Hypothyroidism   UTI   Paraplegia  Sepsis Patient presenting with high white count, hypotension abnormal UA. Lactic acid has been normal. We'll start the patient empirically on vancomycin and Zosyn for sepsis due to UTI. Urine culture has been obtained. Her previous urine culture obtained from the 27th January showed sensitivity to Zosyn as per the ED physician. Will continue with Zosyn and vancomycin at this time. Patient is hypotensive, and has received 2 L of IV fluids in the ED. One extra liters of fluid has been ordered by the ED physician. Patient will be continued on IV fluids normal saline at 125 mL per hour.  Destructive changes at L1-L2 on x-ray X-ray of the abdomen showed destructive changes of L1-L2, will order MRI of the lumbar spine to rule out underlying osteitis/discitis. In the meantime patient is already on antibiotics with vancomycin and Zosyn.  Acute kidney injury Patient has creatinine of 1.5, her baseline creatinine is 0.71. Will continue with IV fluids and follow BMP in a.m.  Hypothyroidism Continue  Synthroid  Tremors Patient has h/o tremors, denies any h/o seizures.  Diarrhea Will check stool for C. Difficile  History of paraplegia Patient has paraplegia, has chronic Foley catheter in place.  DVT prophylaxis Lovenox  Code status: full code   Family discussion: no family at bedside    Time Spent on Admission:  60 minutes  [facility] Pager: [phone] 05/07/2014, 8:41 PM  If 7PM-7AM, please contact night-coverage  [email or web address]  Password [email or web address]

## 2014-05-07 NOTE — Plan of Care (Signed)
Problem: Phase I Progression Outcomes Goal: Voiding-avoid urinary catheter unless indicated Outcome: Not Met (add Reason) Chronic catheter use       

## 2014-05-07 NOTE — ED Notes (Signed)
Pericare done.  Entire area to buttock noted to have broken, redness.  Protective cream applied.

## 2014-05-07 NOTE — ED Notes (Signed)
Pt arrived to ED with foley intact

## 2014-05-07 NOTE — ED Notes (Signed)
Dr pickering informed of pts SBP 70's when asleep and 90's when awake.  Verbal order givne to given 500 ml NS bolus.

## 2014-05-07 NOTE — ED Provider Notes (Signed)
CSN: [id number]     Arrival date & time 05/07/14  1342 History  This chart was scribed for [employer]. Alvino Chapel, MD by Edison Simon, ED Scribe. This patient was seen in room 3S06C/3S06C-01 and the patient's care was started at 3:06 PM.    Chief Complaint  Patient presents with  . Tremors   The history is provided by the patient. No language interpreter was used.    HPI Comments: [name] is a 69 y.o. female resident at [facility] who presents to the Emergency Department complaining of "jerking" tremors to both arms with onset 3 days ago. She states she has had tremors for 8 years but has not had "jerking" until 3 days ago, and states it has worsened progressively. She reports associated decreased food intake since yesterday; she states she thinks she can eat but needs help eating due to jerking. She reports associated diaphoresis and diarrhea. She is hypotensive, measuring at 82/52. She is paralyzed from the waist down. She denies vomiting, fever, lightheadedness, or dizziness.  Past Medical History  Diagnosis Date  . Paraplegic spinal paralysis   . Hypertension   . Bipolar 1 disorder   . Thyroid disease   . Diabetes mellitus without complication   . Obesity   . Pyelonephritis   . COPD with asthma   . GERD (gastroesophageal reflux disease)    Past Surgical History  Procedure Laterality Date  . Back surgery    . Abdominal hysterectomy     Family History  Problem Relation Age of Onset  . Heart disease Mother    History  Substance Use Topics  . Smoking status: Former Research scientist (life sciences)  . Smokeless tobacco: Not on file  . Alcohol Use: No   OB History    No data available     Review of Systems  Constitutional: Positive for diaphoresis. Negative for fever.  Gastrointestinal: Positive for diarrhea. Negative for nausea and vomiting.  Neurological: Positive for tremors. Negative for dizziness and light-headedness.  All other systems reviewed and are negative.    Allergies  Codeine and  Propranolol hcl  Home Medications   Prior to Admission medications   Medication Sig Start Date End Date Taking? Authorizing Provider  acetaminophen (TYLENOL) 500 MG tablet Take 500 mg by mouth every 6 (six) hours as needed for pain.   Yes Historical Provider, MD  acidophilus (RISAQUAD) CAPS capsule Take 1 capsule by mouth daily.   Yes Historical Provider, MD  aspirin EC 81 MG tablet Take 81 mg by mouth daily.   Yes Historical Provider, MD  b complex vitamins tablet Take 1 tablet by mouth daily.   Yes Historical Provider, MD  baclofen (LIORESAL) 10 MG tablet Take 10 mg by mouth 2 (two) times daily.    Yes Historical Provider, MD  buPROPion (WELLBUTRIN XL) 150 MG 24 hr tablet Take 150 mg by mouth daily.   Yes Historical Provider, MD  calcium citrate-vitamin D 500-400 MG-UNIT Chew 1 tablet by mouth 2 (two) times daily.   Yes Historical Provider, MD  carvedilol (COREG) 12.5 MG tablet Take 12.5 mg by mouth 2 (two) times daily with a meal.   Yes Historical Provider, MD  cetirizine (ZYRTEC) 10 MG tablet Take 10 mg by mouth daily.   Yes Historical Provider, MD  colchicine 0.6 MG tablet Take 0.6 mg by mouth 2 (two) times daily.   Yes Historical Provider, MD  Cranberry 475 MG CAPS Take 1 capsule by mouth daily.   Yes Historical Provider, MD  dextromethorphan-guaiFENesin (  MUCINEX DM) 30-600 MG per 12 hr tablet Take 1 tablet by mouth 2 (two) times daily as needed for cough.   Yes Historical Provider, MD  diphenhydrAMINE (BENADRYL) 12.5 MG chewable tablet Chew 12.5 mg by mouth 4 (four) times daily as needed for allergies.   Yes Historical Provider, MD  ezetimibe (ZETIA) 10 MG tablet Take 10 mg by mouth daily.   Yes Historical Provider, MD  fentaNYL (DURAGESIC - DOSED MCG/HR) 25 MCG/HR patch Place 1 patch (25 mcg total) onto the skin every 3 (three) days. 04/01/14  Yes Annita Brod, MD  furosemide (LASIX) 40 MG tablet Take 40 mg by mouth daily.   Yes Historical Provider, MD  gabapentin (NEURONTIN) 300 MG  capsule Take 300 mg by mouth 3 (three) times daily.   Yes Historical Provider, MD  hydroxypropyl methylcellulose / hypromellose (ISOPTO TEARS / GONIOVISC) 2.5 % ophthalmic solution Place 1 drop into both eyes as needed for dry eyes.   Yes Historical Provider, MD  iron polysaccharides (POLY-IRON 150) 150 MG capsule Take 150 mg by mouth daily.   Yes Historical Provider, MD  levothyroxine (SYNTHROID, LEVOTHROID) 200 MCG tablet Take 200 mcg by mouth daily. Takes with Synthroid (Levothyroxine) 25 mcg for a total of 224mcg daily at 6:00am   Yes Historical Provider, MD  levothyroxine (SYNTHROID, LEVOTHROID) 25 MCG tablet Take 25 mcg by mouth daily. Takes with Synthroid (Levothyroxine) 239mcg for a total of 278mcg daily at 6:00am   Yes Historical Provider, MD  lisinopril (PRINIVIL,ZESTRIL) 5 MG tablet Take 5 mg by mouth daily.   Yes Historical Provider, MD  montelukast (SINGULAIR) 10 MG tablet Take 10 mg by mouth daily.   Yes Historical Provider, MD  Omega-3 Fatty Acids (SEA-OMEGA PO) Take 1 capsule by mouth 2 (two) times daily.   Yes Historical Provider, MD  QUEtiapine (SEROQUEL) 50 MG tablet Take 50 mg by mouth at bedtime.   Yes Historical Provider, MD  senna-docusate (SENOKOT-S) 8.6-50 MG per tablet Take 1 tablet by mouth at bedtime.   Yes Historical Provider, MD  vitamin C (ASCORBIC ACID) 500 MG tablet Take 500 mg by mouth 2 (two) times daily.   Yes Historical Provider, MD  albuterol (PROVENTIL HFA) 108 (90 BASE) MCG/ACT inhaler Inhale 2 puffs into the lungs every 8 (eight) hours as needed (Asthma).     Historical Provider, MD  alendronate (FOSAMAX) 70 MG tablet Take 70 mg by mouth once a week. Take with a full glass of water on an empty stomach.    Historical Provider, MD  alum & mag hydroxide-simeth (MAALOX/MYLANTA) 200-200-20 MG/5ML suspension Take 30 mLs by mouth every 4 (four) hours as needed for indigestion or heartburn.    Historical Provider, MD  Amino Acids-Protein Hydrolys (FEEDING SUPPLEMENT,  PRO-STAT SUGAR FREE 64,) LIQD Take 30 mLs by mouth 3 (three) times daily with meals. Patient not taking: Reported on 05/07/2014 04/01/14   Annita Brod, MD  ipratropium-albuterol (DUONEB) 0.5-2.5 (3) MG/3ML SOLN Take 3 mLs by nebulization every 6 (six) hours as needed (shortness of breath).     Historical Provider, MD  LORazepam (ATIVAN) 0.5 MG tablet Take 0.5 tablets (0.25 mg total) by mouth every 6 (six) hours as needed for anxiety. 04/01/14   Annita Brod, MD  oxycodone (OXY-IR) 5 MG capsule Take 1 capsule (5 mg total) by mouth every 6 (six) hours as needed. 04/01/14   Annita Brod, MD  promethazine (PHENERGAN) 12.5 MG tablet Take 12.5 mg by mouth every 6 (six) hours as  needed for nausea or vomiting.    Historical Provider, MD  QUEtiapine (SEROQUEL) 25 MG tablet Take 0.5 tablets (12.5 mg total) by mouth at bedtime. Patient not taking: Reported on 05/07/2014 04/01/14   Annita Brod, MD  tiZANidine (ZANAFLEX) 2 MG tablet Take 2 mg by mouth every 6 (six) hours as needed for muscle spasms.    Historical Provider, MD   BP 119/54 mmHg  Pulse 82  Temp(Src) 98.5 F (36.9 C) (Oral)  Resp 18  Ht 5\' 7"  (1.702 m)  Wt 238 lb 5.1 oz (108.1 kg)  BMI 37.32 kg/m2  SpO2 100% Physical Exam  Constitutional: She is oriented to person, place, and time. She appears well-developed and well-nourished.  HENT:  Head: Normocephalic and atraumatic.  Eyes: Conjunctivae are normal.  Neck: Normal range of motion. Neck supple.  Pulmonary/Chest: Effort normal and breath sounds normal. No respiratory distress. She has no wheezes. She has no rales.  Abdominal: Soft. She exhibits no distension. There is no tenderness.  Genitourinary:  Reddening of perianal area No infection or abscess  Musculoskeletal: Normal range of motion.  good grip strength bilaterally  Neurological: She is alert and oriented to person, place, and time.  Skin: Skin is warm. She is diaphoretic.  Psychiatric: She has a normal mood and  affect.  Nursing note and vitals reviewed.   ED Course  Procedures   COORDINATION OF CARE: 11:37 PM Discussed treatment plan with patient at beside, the patient agrees with the plan and has no further questions at this time.   Labs Review Labs Reviewed  CLOSTRIDIUM DIFFICILE BY PCR - Abnormal; Notable for the following:    C difficile by pcr POSITIVE (*)    All other components within normal limits  COMPREHENSIVE METABOLIC PANEL - Abnormal; Notable for the following:    Sodium 130 (*)    BUN 64 (*)    Creatinine, Ser 1.32 (*)    Albumin 2.6 (*)    Alkaline Phosphatase 211 (*)    GFR calc non Af Amer 41 (*)    GFR calc Af Amer 48 (*)    All other components within normal limits  CBC WITH DIFFERENTIAL/PLATELET - Abnormal; Notable for the following:    WBC 22.1 (*)    RBC 3.34 (*)    Hemoglobin 9.0 (*)    HCT 28.6 (*)    RDW 16.4 (*)    Neutrophils Relative % 80 (*)    Neutro Abs 17.8 (*)    Monocytes Absolute 1.5 (*)    All other components within normal limits  URINALYSIS, ROUTINE W REFLEX MICROSCOPIC - Abnormal; Notable for the following:    Hgb urine dipstick LARGE (*)    Protein, ur TRACE (*)    Leukocytes, UA LARGE (*)    All other components within normal limits  URINE MICROSCOPIC-ADD ON - Abnormal; Notable for the following:    Squamous Epithelial / LPF FEW (*)    Bacteria, UA MANY (*)    All other components within normal limits  CBC - Abnormal; Notable for the following:    WBC 13.6 (*)    RBC 3.37 (*)    Hemoglobin 9.0 (*)    HCT 29.2 (*)    RDW 16.5 (*)    All other components within normal limits  COMPREHENSIVE METABOLIC PANEL - Abnormal; Notable for the following:    BUN 51 (*)    Albumin 2.5 (*)    Alkaline Phosphatase 183 (*)    GFR calc non  Af Amer 66 (*)    GFR calc Af Amer 77 (*)    All other components within normal limits  I-STAT CHEM 8, ED - Abnormal; Notable for the following:    Sodium 134 (*)    BUN 61 (*)    Creatinine, Ser 1.50 (*)     Hemoglobin 10.2 (*)    HCT 30.0 (*)    All other components within normal limits  CULTURE, BLOOD (ROUTINE X 2)  CULTURE, BLOOD (ROUTINE X 2)  MRSA PCR SCREENING  URINE CULTURE  TSH  TROPONIN I  BASIC METABOLIC PANEL  CBC  I-STAT CG4 LACTIC ACID, ED  I-STAT CG4 LACTIC ACID, ED    Imaging Review Mr Thoracic Spine W Wo Contrast  05/08/2014   : ADDENDUM REPORT: 05/08/2014 19:55  ADDENDUM: Critical Value/emergent results were called by telephone at the time of interpretation on 05/08/2014 at 7:55 pm to Dr. Darrick Meigs , who verbally acknowledged these results.  Electronically Signed By: Franchot Gallo M.D. On: 05/08/2014 19:55  CLINICAL DATA: History of spinal abscess. Sepsis. Elevated white blood count 22000. Hypertension. Abnormal x-ray at L2-3.  EXAM: MRI THORACIC AND LUMBAR SPINE WITHOUT AND WITH CONTRAST  TECHNIQUE: Multiplanar and multiecho pulse sequences of the thoracic and lumbar spine were obtained without and with intravenous contrast.  CONTRAST: 34mL MULTIHANCE GADOBENATE DIMEGLUMINE 529 MG/ML IV SOLN  COMPARISON: Thoracic MRI 07/10/2006 and 06/30/2006  FINDINGS: MR THORACIC SPINE FINDINGS  Image quality degraded by motion.  Multiple hemangiomata in the thoracic spine vertebral bodies including T4, T8, T10, T12. Negative for fracture. Postcontrast imaging significantly degraded by motion however no evidence of neoplasm is identified  The spinal canal is markedly abnormal. There is a large fluid collection in the spinal canal beginning at approximately T3 and extending third T11. Based on axial images, this appears to be extramedullary and has CSF signal intensity. There is some layering low signal material within the cyst on gradient echo imaging suggesting there may be blood within the fluid. This is compressing the cord beginning at T4 and extending through T12. The cord is difficult to identify below T7 and appears markedly atrophic. Postcontrast imaging does not have significant dural  enhancement suggesting epidural abscess.  Large mass within the spinal canal the T7 level of uncertain etiology. This is low signal on T1 and T2 and could represent a hematoma or large calcified disc fragment. This also is contributing to significant cord compression.  Multilevel disc degeneration and spondylosis most prominent at C6-7, T7-8, T8-9, and T9-10 contribute to spinal stenosis.  There has been decompressive laminectomy T10 through T12. Correlate with prior surgical findings.  MR LUMBAR SPINE FINDINGS  Image quality degraded by significant motion.  Bony destruction at L2-3 centered at the disc space with extensive fluid between the upper L2 lower L3 vertebral bodies. This area shows diffuse enhancement following contrast infusion. In addition, there is diffuse dural enhancement in the lower lumbar canal suggesting spinal infection and epidural infection. Enhancement extends into the facet joints bilaterally. There is rotation of L2 on L3 secondary to instability. There is severe spinal stenosis at L2-3 with marked compression of the thecal sac.  Epidural fluid collection extends to T11 with compression of the cord as described above. Conus medullaris terminates at T12-L1. Possible syrinx in the distal conus.  Chronic degenerative changes are present throughout the lumbar spine involving the disc spaces and facet joints.  IMPRESSION: Image quality is significantly degraded by motion in the thoracic and lumbar spine.  Large  extramedullary fluid collection in the thoracic spine from T4 through T11. This is compressing the cord. There is marked cord atrophy especially at T7-8 and below. This fluid collection could be related to chronic hematoma as some of the signal characteristics suggests layering blood. There is also a non cystic mass within the spinal canal compressing the cord at the T7 level which could be hematoma. Calcified disc or epidural mass are other considerations.  The marked destructive process  at L2-3 with severe spinal stenosis. There is extensive enhancement of a large fluid collection between the L2 and L3 vertebral bodies extending into the facet joints bilaterally, concerning for osteomyelitis and abscess, particularly given the patient's clinical findings.   Electronically Signed   By: Franchot Gallo M.D.   On: 05/08/2014 20:03   Mr Lumbar Spine W Wo Contrast  05/08/2014   ADDENDUM REPORT: 05/08/2014 19:55  ADDENDUM: Critical Value/emergent results were called by telephone at the time of interpretation on 05/08/2014 at 7:55 pm to Dr. Darrick Meigs , who verbally acknowledged these results.   Electronically Signed   By: Franchot Gallo M.D.   On: 05/08/2014 19:55   05/08/2014   CLINICAL DATA:  History of spinal abscess. Sepsis. Elevated white blood count 22000. Hypertension. Abnormal x-ray at L2-3.  EXAM: MRI THORACIC AND LUMBAR SPINE WITHOUT AND WITH CONTRAST  TECHNIQUE: Multiplanar and multiecho pulse sequences of the thoracic and lumbar spine were obtained without and with intravenous contrast.  CONTRAST:  59mL MULTIHANCE GADOBENATE DIMEGLUMINE 529 MG/ML IV SOLN  COMPARISON:  Thoracic MRI 07/10/2006 and 06/30/2006  FINDINGS: MR THORACIC SPINE FINDINGS  Image quality degraded by motion.  Multiple hemangiomata in the thoracic spine vertebral bodies including T4, T8, T10, T12. Negative for fracture. Postcontrast imaging significantly degraded by motion however no evidence of neoplasm is identified  The spinal canal is markedly abnormal. There is a large fluid collection in the spinal canal beginning at approximately T3 and extending third T11. Based on axial images, this appears to be extramedullary and has CSF signal intensity. There is some layering low signal material within the cyst on gradient echo imaging suggesting there may be blood within the fluid. This is compressing the cord beginning at T4 and extending through T12. The cord is difficult to identify below T7 and appears markedly atrophic.  Postcontrast imaging does not have significant dural enhancement suggesting epidural abscess.  Large mass within the spinal canal the T7 level of uncertain etiology. This is low signal on T1 and T2 and could represent a hematoma or large calcified disc fragment. This also is contributing to significant cord compression.  Multilevel disc degeneration and spondylosis most prominent at C6-7, T7-8, T8-9, and T9-10 contribute to spinal stenosis.  There has been decompressive laminectomy T10 through T12. Correlate with prior surgical findings.  MR LUMBAR SPINE FINDINGS  Image quality degraded by significant motion.  Bony destruction at L2-3 centered at the disc space with extensive fluid between the upper L2 lower L3 vertebral bodies. This area shows diffuse enhancement following contrast infusion. In addition, there is diffuse dural enhancement in the lower lumbar canal suggesting spinal infection and epidural infection. Enhancement extends into the facet joints bilaterally. There is rotation of L2 on L3 secondary to instability. There is severe spinal stenosis at L2-3 with marked compression of the thecal sac.  Epidural fluid collection extends to T11 with compression of the cord as described above. Conus medullaris terminates at T12-L1. Possible syrinx in the distal conus.  Chronic degenerative changes are present  throughout the lumbar spine involving the disc spaces and facet joints.  IMPRESSION: Image quality is significantly degraded by motion in the thoracic and lumbar spine.  Large extramedullary fluid collection in the thoracic spine from T4 through T11. This is compressing the cord. There is marked cord atrophy especially at T7-8 and below. This fluid collection could be related to chronic hematoma as some of the signal characteristics suggests layering blood. There is also a non cystic mass within the spinal canal compressing the cord at the T7 level which could be hematoma. Calcified disc or epidural mass are  other considerations.  The marked destructive process at L2-3 with severe spinal stenosis. There is extensive enhancement of a large fluid collection between the L2 and L3 vertebral bodies extending into the facet joints bilaterally, concerning for osteomyelitis and abscess, particularly given the patient's clinical findings.  Electronically Signed: By: Franchot Gallo M.D. On: 05/08/2014 19:46   Dg Abd Acute W/chest  05/07/2014   CLINICAL DATA:  69 year old female with diaphoresis, diarrhea and hypotension.  EXAM: ACUTE ABDOMEN SERIES (ABDOMEN 2 VIEW & CHEST 1 VIEW)  COMPARISON:  Prior chest x-ray 03/31/2014 ; CT abdomen/pelvis 06/30/2006  FINDINGS: Cardiac and mediastinal contours are within normal limits. Bibasilar atelectasis. No focal airspace consolidation. No evidence of free air on the up right radiograph. Multiple radiographs are used to encompass the entirety of the abdomen. Radiographs are limited by underpenetration secondary to patient body habitus. No evidence of small bowel obstruction. Moderate colonic stool burden. Advanced destructive changes at the L1-L2 level  IMPRESSION: 1. No acute cardiopulmonary process. 2. Normal, not obstructed bowel gas pattern. 3. Advanced destructive changes are noted at the L1-L2 level which represents an interval finding compared to a remote CT scan of the abdomen from April of 2008. More precise acuity of these findings is limited without prior imaging for comparison. If there is clinical concern for osteomyelitis/discitis consider further evaluation with MRI.   Electronically Signed   By: Jacqulynn Cadet M.D.   On: 05/07/2014 17:07     EKG Interpretation None      MDM   Final diagnoses:  Hypotension  UTI Sepsis  Patient presents with hypotension and fever. Has urinary tract infection. Also had x-ray on acute abdominal series that showed some generation or destructive changes of L1-L2. Osteomyelitis is considered. Patient has repeated fluid boluses.  Lactic acid has been normal twice. Covered with Zosyn and vancomycin. Required repeated fluid boluses for hypotension patient had blood pressures measuring the 60s but she was awake and appropriate so there was some question of the validity of the measurement. Admitted to internal medicine.  I personally performed the services described in this documentation, which was scribed in my presence. The recorded information has been reviewed and is accurate.    Jasper Riling. Alvino Chapel, MD 05/08/14 [phone]

## 2014-05-08 ENCOUNTER — Inpatient Hospital Stay (HOSPITAL_COMMUNITY): Payer: Medicare Other

## 2014-05-08 ENCOUNTER — Ambulatory Visit (HOSPITAL_COMMUNITY)
Admit: 2014-05-08 | Discharge: 2014-05-08 | Disposition: A | Discharge status: Home / Self Care | Payer: Medicare Other | Source: Home / Self Care | Attending: Internal Medicine | Admitting: Internal Medicine

## 2014-05-08 DIAGNOSIS — A0472 Enterocolitis due to Clostridium difficile, not specified as recurrent: Secondary | ICD-10-CM | POA: Diagnosis present

## 2014-05-08 DIAGNOSIS — T8351XD Infection and inflammatory reaction due to indwelling urinary catheter, subsequent encounter: Secondary | ICD-10-CM

## 2014-05-08 DIAGNOSIS — Z960 Presence of urogenital implants: Secondary | ICD-10-CM

## 2014-05-08 DIAGNOSIS — I10 Essential (primary) hypertension: Secondary | ICD-10-CM

## 2014-05-08 DIAGNOSIS — A419 Sepsis, unspecified organism: Secondary | ICD-10-CM

## 2014-05-08 DIAGNOSIS — Z978 Presence of other specified devices: Secondary | ICD-10-CM

## 2014-05-08 DIAGNOSIS — M4804 Spinal stenosis, thoracic region: Secondary | ICD-10-CM | POA: Diagnosis not present

## 2014-05-08 DIAGNOSIS — M4806 Spinal stenosis, lumbar region: Secondary | ICD-10-CM | POA: Diagnosis not present

## 2014-05-08 DIAGNOSIS — N390 Urinary tract infection, site not specified: Secondary | ICD-10-CM | POA: Diagnosis present

## 2014-05-08 LAB — MRSA PCR SCREENING: MRSA by PCR: NEGATIVE

## 2014-05-08 LAB — CBC
HCT: 29.2 % — ABNORMAL LOW (ref 36.0–46.0)
Hemoglobin: 9 g/dL — ABNORMAL LOW (ref 12.0–15.0)
MCH: 26.7 pg (ref 26.0–34.0)
MCHC: 30.8 g/dL (ref 30.0–36.0)
MCV: 86.6 fL (ref 78.0–100.0)
PLATELETS: 304 10*3/uL (ref 150–400)
RBC: 3.37 MIL/uL — ABNORMAL LOW (ref 3.87–5.11)
RDW: 16.5 % — ABNORMAL HIGH (ref 11.5–15.5)
WBC: 13.6 10*3/uL — ABNORMAL HIGH (ref 4.0–10.5)

## 2014-05-08 LAB — COMPREHENSIVE METABOLIC PANEL
ALT: 22 U/L (ref 0–35)
AST: 14 U/L (ref 0–37)
Albumin: 2.5 g/dL — ABNORMAL LOW (ref 3.5–5.2)
Alkaline Phosphatase: 183 U/L — ABNORMAL HIGH (ref 39–117)
Anion gap: 11 (ref 5–15)
BUN: 51 mg/dL — AB (ref 6–23)
CHLORIDE: 107 mmol/L (ref 96–112)
CO2: 22 mmol/L (ref 19–32)
CREATININE: 0.89 mg/dL (ref 0.50–1.10)
Calcium: 8.5 mg/dL (ref 8.4–10.5)
GFR, EST AFRICAN AMERICAN: 77 mL/min — AB (ref >90)
GFR, EST NON AFRICAN AMERICAN: 66 mL/min — AB (ref >90)
GLUCOSE: 85 mg/dL (ref 70–99)
Potassium: 4.3 mmol/L (ref 3.5–5.1)
Sodium: 140 mmol/L (ref 135–145)
Total Bilirubin: 0.7 mg/dL (ref 0.3–1.2)
Total Protein: 6.6 g/dL (ref 6.0–8.3)

## 2014-05-08 LAB — CLOSTRIDIUM DIFFICILE BY PCR: Toxigenic C. Difficile by PCR: POSITIVE — AB

## 2014-05-08 MED ORDER — LORAZEPAM 2 MG/ML IJ SOLN: 1.0000 mg | Freq: Once | INTRAMUSCULAR | Status: DC

## 2014-05-08 MED ORDER — GADOBENATE DIMEGLUMINE 529 MG/ML IV SOLN
20.0000 mL | Freq: Once | INTRAVENOUS | Status: AC | PRN
  Administered 2014-05-08: 20 mL via INTRAVENOUS

## 2014-05-08 MED ORDER — VANCOMYCIN 50 MG/ML ORAL SOLUTION
125.0000 mg | Freq: Four times a day (QID) | ORAL | Status: DC
  Administered 2014-05-08 – 2014-05-09 (×3): 125 mg via ORAL
  Filled 2014-05-08 (×9): qty 2.5

## 2014-05-08 MED ORDER — VANCOMYCIN HCL 10 G IV SOLR
1250.0000 mg | Freq: Two times a day (BID) | INTRAVENOUS | Status: DC
  Administered 2014-05-08 – 2014-05-09 (×3): 1250 mg via INTRAVENOUS
  Filled 2014-05-08 (×6): qty 1250

## 2014-05-08 MED ORDER — PIPERACILLIN-TAZOBACTAM 3.375 G IVPB
3.3750 g | Freq: Three times a day (TID) | INTRAVENOUS | Status: DC
  Administered 2014-05-08 – 2014-05-09 (×4): 3.375 g via INTRAVENOUS
  Filled 2014-05-08 (×8): qty 50

## 2014-05-08 MED ORDER — VANCOMYCIN HCL 10 G IV SOLR
1250.0000 mg | INTRAVENOUS | Status: DC
  Filled 2014-05-08: qty 1250

## 2014-05-08 MED ORDER — PIPERACILLIN-TAZOBACTAM 3.375 G IVPB
INTRAVENOUS | Status: AC
  Filled 2014-05-08: qty 50

## 2014-05-08 MED ORDER — ENOXAPARIN SODIUM 60 MG/0.6ML ~~LOC~~ SOLN
50.0000 mg | SUBCUTANEOUS | Status: DC
  Administered 2014-05-09 – 2014-05-10 (×2): 50 mg via SUBCUTANEOUS
  Filled 2014-05-08 (×4): qty 0.6

## 2014-05-08 MED ORDER — LORAZEPAM 2 MG/ML IJ SOLN
INTRAMUSCULAR | Status: AC
  Administered 2014-05-08: 1 mg via INTRAVENOUS
  Filled 2014-05-08: qty 1

## 2014-05-08 MED ORDER — LORAZEPAM 2 MG/ML IJ SOLN
1.0000 mg | Freq: Once | INTRAMUSCULAR | Status: AC
  Administered 2014-05-08: 1 mg via INTRAVENOUS
  Filled 2014-05-08: qty 0.5

## 2014-05-08 NOTE — Progress Notes (Addendum)
Called and discussed with neurosurgeon on call Dr. Vertell Limber, who reviewed the MRI films from today. Most of the changes in the thoracic spine appear chronic, changes in the L1-L2 appear acute with osteomyelitis/abscess patient has history of long standing paraplegia. Nothing to offer from neurosurgical standpoint at this time. Patient will be transferred to [facility] stepdown. Will need infectious disease consultation in a.m.

## 2014-05-08 NOTE — Progress Notes (Signed)
Got a call from radiologist Dr. Carlis Abbott to discuss the MRI findings. 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 Also large extramedullary fluid collection in thoracic spine from T4 through the 11. Noncystic mass within the spinal canal compressing the cord at the T7 level which could be hematoma. Calcified disc or epidural mass are other considerations. Patient was transferred to [facility] for MRI and she has not still returned back to [facility]. Discussed with the nursing staff that patient needs to stay at [facility]. Burnis Medin try to arrange for the transfer if patient is already on way back to [facility] by ambulance.

## 2014-05-08 NOTE — Care Management Note (Signed)
    Page 1 of 1   05/08/2014     2:57:28 PM CARE MANAGEMENT NOTE 05/08/2014  Patient:  [name], [name]   Account Number:  [id number]  Date Initiated:  05/08/2014  Documentation initiated by:  Jolene Provost  Subjective/Objective Assessment:   Admitted for sepsis. Pt is from [facility]. Pt plans to return to [facility] at discharge. CSW is aware and arrange for placement at discharge. No CM needs identified.     Action/Plan:   Anticipated DC Date:  05/11/2014   Anticipated DC Plan:  SKILLED NURSING FACILITY  In-house referral  Clinical Social Worker      DC Planning Services  CM consult      Choice offered to / List presented to:             Status of service:  Completed, signed off Medicare Important Message given?   (If response is "NO", the following Medicare IM given date fields will be blank) Date Medicare IM given:   Medicare IM given by:   Date Additional Medicare IM given:   Additional Medicare IM given by:    Discharge Disposition:  [place]  Per UR Regulation:  Reviewed for med. necessity/level of care/duration of stay  If discussed at [place] of Stay Meetings, dates discussed:    Comments:  05/08/2014 [place], RN, MSN, CM

## 2014-05-08 NOTE — Progress Notes (Signed)
Patient transferring to [place] room 6, report called.

## 2014-05-08 NOTE — Clinical Social Work Psychosocial (Signed)
Clinical Social Work Department BRIEF PSYCHOSOCIAL ASSESSMENT 05/08/2014  Patient:  [name], [name]     Account Number:  [id number]     Admit date:  05/07/2014  Clinical Social Worker:  [name]  Date/Time:  05/08/2014 01:12 PM  Referred by:  CSW  Date Referred:  05/08/2014 Referred for  SNF Placement   Other Referral:   Interview type:  Patient Other interview type:    PSYCHOSOCIAL DATA Living Status:  FACILITY Admitted from facility:  [facility] Level of care:  [place] Primary support name:  [name] Primary support relationship to patient:  CHILD, ADULT Degree of support available:   limited    CURRENT CONCERNS Current Concerns  Post-Acute Placement   Other Concerns:    SOCIAL WORK ASSESSMENT / PLAN CSW met with pt at bedside. Pt alert and oriented and reports she has been a resident at [facility] for about 7 years. She has been paraplegic for about 8 years. Her daughter, [name] cared for her for awhile, but then needed to look at SNF to meet her needs. Pt states, "[facility] is exactly where I want to be." She became tearful when asking if she could return there. CSW assured her that per [name] at facility, pt is okay to return. Pt states that her daughter visits occasionally. [name] states that pt is nursing level of care. She was getting to her electric wheelchair, but has been refusing recently and wanting to stay in bed. Pt states that she is uncomfortable in the wheelchair and prefers to stay in bed.   Assessment/plan status:  Psychosocial Support/Ongoing Assessment of Needs Other assessment/ plan:   Information/referral to community resources:   [facility]    PATIENT'S/FAMILY'S RESPONSE TO PLAN OF CARE: Pt is eager to return to [facility] when medically stable. CSW will continue to follow.       [name], [place]

## 2014-05-08 NOTE — Progress Notes (Signed)
[facility] for Vancomycin & Zosyn Indication: rule out sepsis  Allergies  Allergen Reactions  . Codeine   . Propranolol Hcl     Patient Measurements: Height: 5\' 7"  (170.2 cm) Weight: 238 lb 5.1 oz (108.1 kg) IBW/kg (Calculated) : 61.6  Vital Signs: Temp: 98.2 F (36.8 C) (02/10 2145) Temp Source: Oral (02/10 2145) BP: 85/42 mmHg (02/10 2345) Pulse Rate: 70 (02/10 2345) Intake/Output from previous day: 02/10 0701 - 02/11 0700 In: 116.7 [I.V.:116.7] Out: -  Intake/Output from this shift: Total I/O In: 116.7 [I.V.:116.7] Out: -   Labs:  Recent Labs  05/07/14 1330 05/07/14 1559  WBC 22.1*  --   HGB 9.0* 10.2*  PLT 331  --   CREATININE 1.32* 1.50*   Estimated Creatinine Clearance: 46.7 mL/min (by C-G formula based on Cr of 1.5). No results for input(s): VANCOTROUGH, VANCOPEAK, VANCORANDOM, GENTTROUGH, GENTPEAK, GENTRANDOM, TOBRATROUGH, TOBRAPEAK, TOBRARND, AMIKACINPEAK, AMIKACINTROU, AMIKACIN in the last 72 hours.   Microbiology: Recent Results (from the past 720 hour(s))  Culture, blood (routine x 2)     Status: None (Preliminary result)   Collection Time: 05/07/14  3:30 PM  Result Value Ref Range Status   Specimen Description BLOOD LEFT ANTECUBITAL  Final   Special Requests BOTTLES DRAWN AEROBIC AND ANAEROBIC 6CC  Final   Culture PENDING  Incomplete   Report Status PENDING  Incomplete    Anti-infectives    Start     Dose/Rate Route Frequency Ordered Stop   05/08/14 0100  piperacillin-tazobactam (ZOSYN) IVPB 3.375 g     3.375 g 12.5 mL/hr over 240 Minutes Intravenous Every 8 hours 05/08/14 0033     05/07/14 1530  piperacillin-tazobactam (ZOSYN) IVPB 3.375 g     3.375 g 100 mL/hr over 30 Minutes Intravenous  Once 05/07/14 1519 05/07/14 1620   05/07/14 1530  vancomycin (VANCOCIN) IVPB 1000 mg/200 mL premix     1,000 mg 200 mL/hr over 60 Minutes Intravenous  Once 05/07/14 1519 05/07/14 1815      Assessment: [name]  admitted from NH with UTI, possible sepsis.  WBC elevated on admission.  Patient afebrile. Lactic acid normal & trending down.  Cx data pending.  MRI pending to r/o diskitis.  Acute kidney injury noted.  Scr increased from baseline & repeat labs trending up from admission.  Estimated NCrCl ~ 40-45 ml/min  Vancomycin 2/10>> Zosyn 2/10>>  Goal of Therapy:  Vancomycin trough level 15-20 mcg/ml  Plan:  Zosyn 3.375gm IV Q8h to be infused over 4hrs Vancomycin 1250mg  IV q24h Check Vancomycin trough at steady state Monitor renal function and cx data  Duration of therapy per MD- de-escalate antibiotics once appropriate  [name], [name] 05/08/2014,12:36 AM

## 2014-05-08 NOTE — Progress Notes (Signed)
Received call from [facility] that Dr. Darrick Meigs had initiated the transfer to [facility], a bed was available, and that the truck transporting the patient was turning back around and the patient would not be coming back to [facility]. Asked for me to take any patient belongings to the ED to be transferred by a [facility] truck that was picking up another patient. Placed patient's personal gown and foley leg strap along with personal care items and chart paperwork in a patient belonging bag, labled,  and taken to the ED.

## 2014-05-08 NOTE — Progress Notes (Signed)
ANTIBIOTIC CONSULT NOTE - follow up  Pharmacy Consult for Vancomycin & Zosyn Indication: rule out sepsis  Allergies  Allergen Reactions  . Codeine   . Propranolol Hcl    Patient Measurements: Height: 5\' 7"  (170.2 cm) Weight: 238 lb 5.1 oz (108.1 kg) IBW/kg (Calculated) : 61.6  Vital Signs: Temp: 98.1 F (36.7 C) (02/11 0400) Temp Source: Oral (02/11 0400) BP: 96/48 mmHg (02/11 0500) Pulse Rate: 72 (02/11 0230) Intake/Output from previous day: 02/10 0701 - 02/11 0700 In: 704.2 [I.V.:654.2; IV Piggyback:50] Out: 2650 [Urine:2650] Intake/Output from this shift:    Labs:  Recent Labs  05/07/14 1330 05/07/14 1559 05/08/14 0505  WBC 22.1*  --  13.6*  HGB 9.0* 10.2* 9.0*  PLT 331  --  304  CREATININE 1.32* 1.50* 0.89   Estimated Creatinine Clearance: 78.7 mL/min (by C-G formula based on Cr of 0.89). No results for input(s): VANCOTROUGH, VANCOPEAK, VANCORANDOM, GENTTROUGH, GENTPEAK, GENTRANDOM, TOBRATROUGH, TOBRAPEAK, TOBRARND, AMIKACINPEAK, AMIKACINTROU, AMIKACIN in the last 72 hours.   Microbiology: Recent Results (from the past 720 hour(s))  Culture, blood (routine x 2)     Status: None (Preliminary result)   Collection Time: 05/07/14  3:30 PM  Result Value Ref Range Status   Specimen Description BLOOD LEFT ANTECUBITAL  Final   Special Requests BOTTLES DRAWN AEROBIC AND ANAEROBIC 6CC  Final   Culture PENDING  Incomplete   Report Status PENDING  Incomplete  Clostridium Difficile by PCR     Status: Abnormal   Collection Time: 05/07/14  9:30 PM  Result Value Ref Range Status   C difficile by pcr POSITIVE (A) NEGATIVE Final    Comment: RESULT CALLED TO, READ BACK BY AND VERIFIED WITH: KEITH A AT 0154 ON [id number] BY FORSYTH K   MRSA PCR Screening     Status: None   Collection Time: 05/07/14  9:30 PM  Result Value Ref Range Status   MRSA by PCR NEGATIVE NEGATIVE Final    Comment:        The GeneXpert MRSA Assay (FDA approved for NASAL specimens only), is one  component of a comprehensive MRSA colonization surveillance program. It is not intended to diagnose MRSA infection nor to guide or monitor treatment for MRSA infections.     Anti-infectives    Start     Dose/Rate Route Frequency Ordered Stop   05/08/14 1000  vancomycin (VANCOCIN) 1,250 mg in sodium chloride 0.9 % 250 mL IVPB  Status:  Discontinued     1,250 mg 166.7 mL/hr over 90 Minutes Intravenous Every 24 hours 05/08/14 0043 05/08/14 0813   05/08/14 1000  vancomycin (VANCOCIN) 1,250 mg in sodium chloride 0.9 % 250 mL IVPB     1,250 mg 166.7 mL/hr over 90 Minutes Intravenous Every 12 hours 05/08/14 0813     05/08/14 0100  piperacillin-tazobactam (ZOSYN) IVPB 3.375 g     3.375 g 12.5 mL/hr over 240 Minutes Intravenous Every 8 hours 05/08/14 0033     05/07/14 1530  piperacillin-tazobactam (ZOSYN) IVPB 3.375 g     3.375 g 100 mL/hr over 30 Minutes Intravenous  Once 05/07/14 1519 05/07/14 1620   05/07/14 1530  vancomycin (VANCOCIN) IVPB 1000 mg/200 mL premix     1,000 mg 200 mL/hr over 60 Minutes Intravenous  Once 05/07/14 1519 05/07/14 1815     Assessment: [name] admitted from NH with UTI, possible sepsis.  WBC elevated on admission.  Patient afebrile. Lactic acid normal & trending down.  Cx data pending.  MRI pending to  r/o diskitis.  Acute kidney injury noted.  Scr has improved today.  Normalized clcr ~ 64ml/min  Vancomycin 2/10>> Zosyn 2/10>>  Goal of Therapy:  Vancomycin trough level 15-20 mcg/ml  Plan:  Zosyn 3.375gm IV Q8h to be infused over 4hrs Vancomycin 1250mg  IV q12h Check Vancomycin trough at steady state Monitor renal function and cx data  Duration of therapy per MD- de-escalate antibiotics once appropriate  Hart Robinsons A 05/08/2014,8:15 AM

## 2014-05-08 NOTE — Progress Notes (Signed)
Patient has not returned from having MRI at [facility]. Will assume care once she returns.

## 2014-05-08 NOTE — Care Management Utilization Note (Signed)
UR completed 

## 2014-05-08 NOTE — Progress Notes (Signed)
[facility] PROGRESS NOTE  [name] KZS:[id number] DOB: [birth year] DOA: 05/07/2014 PCP: [name], MD  Assessment/Plan: 1. Sepsis. Source could be multifactorial at this point. Patient's stool tested positive for C. Difficile. She's been started on oral vancomycin. Leukocytosis has started to improve. She does have an abnormal UA with urine culture currently pending. Her abdominal imaging indicated possible destructive changes in L1-L2. This will need to be further evaluated by MRI. Unfortunately, patient's body habitus prohibits MRI at [facility]. Since they have a larger bore scanner at [facility], we will try and obtain MRI at [facility]. She is on broad-spectrum antibiotics. Follow-up blood cultures. Hemodynamics appear to be improving 2. Acute kidney injury. Appears to be improving with hydration. Continue current treatments. 3. Urinary tract infection in a patient with chronic Foley catheter. Continue antibiotics and follow-up urine culture. 4. Clostridium difficile. Patient started on oral vancomycin. She will likely need a prolonged taper. 5. Possible discitis. Abdominal x-ray shows L1-L2 destructive changes. She will undergo MRI to further evaluate for possible discitis. 6. History of paraplegia. Patient reports that she's been a paraplegic due to ruptured blood vessel to her spine result in ischemia. She has been a paraplegic for approximately 8 years now 7. Hypothyroidism. Continue Synthroid  Code Status: full code Family Communication: no family present Disposition Plan: discharge back to [facility] when clinically improved   Consultants:    Procedures:    Antibiotics:  Vancomycin IV 2/10>>>  Zosyn 2/10>>  Vancomycin po 2/11>>  HPI/Subjective: Reports having loose stools. Feels tremors are improving. No shortness of breath  Objective: Filed Vitals:   05/08/14 0818  BP:   Pulse: 73  Temp:   Resp:     Intake/Output Summary (Last 24 hours) at  05/08/14 1026 Last data filed at 05/08/14 0559  Gross per 24 hour  Intake 704.17 ml  Output   2650 ml  Net -1945.83 ml   Filed Weights   05/07/14 2145 05/08/14 0500  Weight: 108.1 kg (238 lb 5.1 oz) 108.1 kg (238 lb 5.1 oz)    Exam:   General:  NAD  Cardiovascular: s1, s2, rrr  Respiratory: cta b  Abdomen: soft, nt, nd, bs+  Musculoskeletal: 1+ edema b/l  Skin: large area of erythema/excoriation noted over buttocks, no clear ulcer/skin break down   Data Reviewed: Basic Metabolic Panel:  Recent Labs Lab 05/07/14 1330 05/07/14 1559 05/08/14 0505  NA 130* 134* 140  K 4.5 4.5 4.3  CL 100 98 107  CO2 20  --  22  GLUCOSE 80 82 85  BUN 64* 61* 51*  CREATININE 1.32* 1.50* 0.89  CALCIUM 8.7  --  8.5   Liver Function Tests:  Recent Labs Lab 05/07/14 1330 05/08/14 0505  AST 18 14  ALT 27 22  ALKPHOS 211* 183*  BILITOT 0.8 0.7  PROT 7.0 6.6  ALBUMIN 2.6* 2.5*   No results for input(s): LIPASE, AMYLASE in the last 168 hours. No results for input(s): AMMONIA in the last 168 hours. CBC:  Recent Labs Lab 05/07/14 1330 05/07/14 1559 05/08/14 0505  WBC 22.1*  --  13.6*  NEUTROABS 17.8*  --   --   HGB 9.0* 10.2* 9.0*  HCT 28.6* 30.0* 29.2*  MCV 85.6  --  86.6  PLT 331  --  304   Cardiac Enzymes:  Recent Labs Lab 05/07/14 1330  TROPONINI <0.03   BNP (last 3 results)  Recent Labs  03/29/14 0429  BNP 73.0    ProBNP (  last 3 results) No results for input(s): PROBNP in the last 8760 hours.  CBG: No results for input(s): GLUCAP in the last 168 hours.  Recent Results (from the past 240 hour(s))  Culture, blood (routine x 2)     Status: None (Preliminary result)   Collection Time: 05/07/14  3:30 PM  Result Value Ref Range Status   Specimen Description BLOOD LEFT ANTECUBITAL  Final   Special Requests BOTTLES DRAWN AEROBIC AND ANAEROBIC 6CC  Final   Culture NO GROWTH 1 DAY  Final   Report Status PENDING  Incomplete  Culture, blood (routine x 2)      Status: None (Preliminary result)   Collection Time: 05/07/14  3:45 PM  Result Value Ref Range Status   Specimen Description BLOOD LEFT ARM DRAWN BY RN DMA  Final   Special Requests BOTTLES DRAWN AEROBIC AND ANAEROBIC 6CC  Final   Culture NO GROWTH 1 DAY  Final   Report Status PENDING  Incomplete  Clostridium Difficile by PCR     Status: Abnormal   Collection Time: 05/07/14  9:30 PM  Result Value Ref Range Status   C difficile by pcr POSITIVE (A) NEGATIVE Final    Comment: RESULT CALLED TO, READ BACK BY AND VERIFIED WITH: KEITH A AT 0154 ON [id number] BY FORSYTH K   MRSA PCR Screening     Status: None   Collection Time: 05/07/14  9:30 PM  Result Value Ref Range Status   MRSA by PCR NEGATIVE NEGATIVE Final    Comment:        The GeneXpert MRSA Assay (FDA approved for NASAL specimens only), is one component of a comprehensive MRSA colonization surveillance program. It is not intended to diagnose MRSA infection nor to guide or monitor treatment for MRSA infections.      Studies: Dg Abd Acute W/chest  05/07/2014   CLINICAL DATA:  69 year old female with diaphoresis, diarrhea and hypotension.  EXAM: ACUTE ABDOMEN SERIES (ABDOMEN 2 VIEW & CHEST 1 VIEW)  COMPARISON:  Prior chest x-ray 03/31/2014 ; CT abdomen/pelvis 06/30/2006  FINDINGS: Cardiac and mediastinal contours are within normal limits. Bibasilar atelectasis. No focal airspace consolidation. No evidence of free air on the up right radiograph. Multiple radiographs are used to encompass the entirety of the abdomen. Radiographs are limited by underpenetration secondary to patient body habitus. No evidence of small bowel obstruction. Moderate colonic stool burden. Advanced destructive changes at the L1-L2 level  IMPRESSION: 1. No acute cardiopulmonary process. 2. Normal, not obstructed bowel gas pattern. 3. Advanced destructive changes are noted at the L1-L2 level which represents an interval finding compared to a remote CT scan of the  abdomen from April of 2008. More precise acuity of these findings is limited without prior imaging for comparison. If there is clinical concern for osteomyelitis/discitis consider further evaluation with MRI.   Electronically Signed   By: Jacqulynn Cadet M.D.   On: 05/07/2014 17:07    Scheduled Meds: . antiseptic oral rinse  7 mL Mouth Rinse BID  . aspirin EC  81 mg Oral Daily  . baclofen  10 mg Oral BID  . buPROPion  150 mg Oral Daily  . enoxaparin (LOVENOX) injection  50 mg Subcutaneous Q24H  . ezetimibe  10 mg Oral Daily  . gabapentin  300 mg Oral TID  . levothyroxine  200 mcg Oral QAC breakfast  . levothyroxine  25 mcg Oral QAC breakfast  . piperacillin-tazobactam (ZOSYN)  IV  3.375 g Intravenous Q8H  . QUEtiapine  50 mg Oral QHS  . vancomycin  1,250 mg Intravenous Q12H  . vancomycin  125 mg Oral QID   Continuous Infusions: . sodium chloride 125 mL/hr at 05/08/14 2423    Active Problems:   Paraplegic spinal paralysis   Thyroid disease   Sepsis   Diarrhea   Hypothyroid   ARF (acute renal failure)   Enteritis due to Clostridium difficile   UTI (urinary tract infection)   Chronic indwelling Foley catheter    Time spent: 45mins    Shanna Un  [facility] Pager [phone]. If 7PM-7AM, please contact night-coverage at [email or web address], password [facility] 05/08/2014, 10:26 AM  LOS: 1 day

## 2014-05-08 NOTE — Clinical Documentation Improvement (Signed)
   MD's, NP's, and PA's   Patient with "chronic indwelling foley" documentation of "Sepsis" due to UTI please clarify if "indwelling foley" is also cause of UTI. Thank you  Possible Clinical Conditions?  Sepsis with UTI with indwelling foley  Sepsis due to an internal device  Bacterial infection of unknown etiology / source  Other Condition   Cannot clinically Determine     Risk Factors: Paraplegic w spinal stenosis h/o UTI, acute renal failure, pyelonephritis  Treatment: IV antibiotics started  Thank You, Ree Kida ,RN Clinical Documentation Specialist:  [place] [employer]

## 2014-05-09 DIAGNOSIS — M47816 Spondylosis without myelopathy or radiculopathy, lumbar region: Secondary | ICD-10-CM

## 2014-05-09 DIAGNOSIS — M869 Osteomyelitis, unspecified: Secondary | ICD-10-CM | POA: Diagnosis present

## 2014-05-09 DIAGNOSIS — G8220 Paraplegia, unspecified: Secondary | ICD-10-CM

## 2014-05-09 DIAGNOSIS — F319 Bipolar disorder, unspecified: Secondary | ICD-10-CM

## 2014-05-09 DIAGNOSIS — F411 Generalized anxiety disorder: Secondary | ICD-10-CM

## 2014-05-09 DIAGNOSIS — E038 Other specified hypothyroidism: Secondary | ICD-10-CM

## 2014-05-09 DIAGNOSIS — B377 Candidal sepsis: Principal | ICD-10-CM | POA: Diagnosis present

## 2014-05-09 DIAGNOSIS — G894 Chronic pain syndrome: Secondary | ICD-10-CM | POA: Diagnosis present

## 2014-05-09 DIAGNOSIS — A047 Enterocolitis due to Clostridium difficile: Secondary | ICD-10-CM

## 2014-05-09 DIAGNOSIS — M4620 Osteomyelitis of vertebra, site unspecified: Secondary | ICD-10-CM

## 2014-05-09 DIAGNOSIS — E069 Thyroiditis, unspecified: Secondary | ICD-10-CM

## 2014-05-09 DIAGNOSIS — M4646 Discitis, unspecified, lumbar region: Secondary | ICD-10-CM

## 2014-05-09 DIAGNOSIS — N390 Urinary tract infection, site not specified: Secondary | ICD-10-CM

## 2014-05-09 DIAGNOSIS — N179 Acute kidney failure, unspecified: Secondary | ICD-10-CM | POA: Diagnosis present

## 2014-05-09 DIAGNOSIS — M4645 Discitis, unspecified, thoracolumbar region: Secondary | ICD-10-CM | POA: Diagnosis present

## 2014-05-09 DIAGNOSIS — E039 Hypothyroidism, unspecified: Secondary | ICD-10-CM

## 2014-05-09 DIAGNOSIS — A419 Sepsis, unspecified organism: Secondary | ICD-10-CM

## 2014-05-09 DIAGNOSIS — R197 Diarrhea, unspecified: Secondary | ICD-10-CM

## 2014-05-09 DIAGNOSIS — M479 Spondylosis, unspecified: Secondary | ICD-10-CM | POA: Diagnosis present

## 2014-05-09 DIAGNOSIS — I10 Essential (primary) hypertension: Secondary | ICD-10-CM

## 2014-05-09 LAB — COMPREHENSIVE METABOLIC PANEL
ALK PHOS: 175 U/L — AB (ref 39–117)
ALT: 17 U/L (ref 0–35)
AST: 16 U/L (ref 0–37)
Albumin: 2.4 g/dL — ABNORMAL LOW (ref 3.5–5.2)
Anion gap: 13 (ref 5–15)
BILIRUBIN TOTAL: 1 mg/dL (ref 0.3–1.2)
BUN: 19 mg/dL (ref 6–23)
CO2: 17 mmol/L — AB (ref 19–32)
Calcium: 8.8 mg/dL (ref 8.4–10.5)
Chloride: 110 mmol/L (ref 96–112)
Creatinine, Ser: 0.71 mg/dL (ref 0.50–1.10)
GFR, EST NON AFRICAN AMERICAN: 88 mL/min — AB (ref >90)
GLUCOSE: 72 mg/dL (ref 70–99)
POTASSIUM: 3.9 mmol/L (ref 3.5–5.1)
SODIUM: 140 mmol/L (ref 135–145)
Total Protein: 6.7 g/dL (ref 6.0–8.3)

## 2014-05-09 LAB — CBC
HCT: 30.8 % — ABNORMAL LOW (ref 36.0–46.0)
Hemoglobin: 9.3 g/dL — ABNORMAL LOW (ref 12.0–15.0)
MCH: 26.3 pg (ref 26.0–34.0)
MCHC: 30.2 g/dL (ref 30.0–36.0)
MCV: 87.3 fL (ref 78.0–100.0)
PLATELETS: 345 10*3/uL (ref 150–400)
RBC: 3.53 MIL/uL — ABNORMAL LOW (ref 3.87–5.11)
RDW: 17 % — AB (ref 11.5–15.5)
WBC: 8.3 10*3/uL (ref 4.0–10.5)

## 2014-05-09 LAB — URINE CULTURE

## 2014-05-09 LAB — LACTIC ACID, PLASMA: LACTIC ACID, VENOUS: 0.8 mmol/L (ref 0.5–2.0)

## 2014-05-09 LAB — MAGNESIUM: Magnesium: 1.8 mg/dL (ref 1.5–2.5)

## 2014-05-09 MED ORDER — LORAZEPAM 0.5 MG PO TABS
0.5000 mg | ORAL_TABLET | ORAL | Status: DC | PRN
  Administered 2014-05-10 – 2014-05-12 (×4): 0.5 mg via ORAL
  Filled 2014-05-09 (×4): qty 1

## 2014-05-09 MED ORDER — QUETIAPINE FUMARATE 25 MG PO TABS
75.0000 mg | ORAL_TABLET | Freq: Every day | ORAL | Status: DC
  Administered 2014-05-09 – 2014-05-19 (×11): 75 mg via ORAL
  Filled 2014-05-09 (×12): qty 1

## 2014-05-09 MED ORDER — GABAPENTIN 400 MG PO CAPS
400.0000 mg | ORAL_CAPSULE | Freq: Three times a day (TID) | ORAL | Status: DC
  Administered 2014-05-09 – 2014-05-11 (×6): 400 mg via ORAL
  Filled 2014-05-09 (×7): qty 1

## 2014-05-09 MED ORDER — OXYCODONE HCL 5 MG PO TABS
5.0000 mg | ORAL_TABLET | Freq: Four times a day (QID) | ORAL | Status: DC | PRN
  Administered 2014-05-09 – 2014-05-16 (×21): 5 mg via ORAL
  Filled 2014-05-09 (×23): qty 1

## 2014-05-09 NOTE — Progress Notes (Signed)
[facility] TEAM 1 - Stepdown/ICU TEAM Progress Note  [name] ZOX:[id number] DOB: 09-10-1945 DOA: 05/07/2014 PCP: [name], MD  Admit HPI / Brief Narrative: 69 year old WF PMHx ; Bipolar 1 disorder, anxiety, chronic pain syndrome, Paraplegic spinal paralysis; Hypertension; Thyroid disease; Diabetes mellitus without complication; Obesity; Pyelonephritis; COPD with asthma; and GERD (gastroesophageal reflux disease). Patient was brought from [facility] home after she complained of jerking tremors to both upper extremities for past 3 days. In the ED patient was found to be hypotensive with blood pressure 82/52, also patient found to have abnormal UA and complained of auditory hallucinations. Patient started on vancomycin and Zosyn for sepsis due to UTI. Lactic acid was normal with 1.55 and repeat lactic acid 0.76. Patient did have high white count of 22.1. She complains of diarrhea for past few days. Denies nausea or vomiting. Patient is alert and oriented 3, able to answer all the questions she is mentating very well despite the low blood pressure. Abdominal x-ray showed advanced destructive changes in the L1-L2 level, with recommendation for MRI for possible osteoma mellitus/discitis for further evaluation. Patient has chronic Foley catheter, she was diagnosed with UTI recently on January 27.   HPI/Subjective: 2/12 A/O 4, complains of neuropathic pain in hands and arms, requests her narcotics.  Assessment/Plan: Bipolar 1 disorder -Seroquel 75 mg  QHS   Anxiety -Lorazepam 0.5 mg q 4 hr PRN   Chronic pain syndrome -Description patient's arm pain more consistent with neuropathic pain  -Oxycodone 5 mg q 6 hr PRN Pain (home regimen) -Increase Neurontin 400 mg TID -Continue Zanaflex 2 mg q 6hr PRN  Sepsis unspecified organism -Sepsis physiology beginning to resolve -Continue to monitor CBC, CMP, magnesium  Destructive changes at L1-L2 on x-ray -X-ray of the abdomen showed  destructive changes of L1-L2, will order MRI of the lumbar spine to rule out underlying osteitis/discitis.  -Per ID DC all antibiotics will obtain fluid from hematoma and back on Monday or Tuesday by IR.  Acute kidney injury Patient has creatinine of 1.5, her baseline creatinine is 0.71. Will continue with IV fluids and follow BMP in a.m.  Hypothyroidism -Obtain TSH -Continue Synthroid 225 g daily  HTN -Continue to hold BP medication -Continue normal saline 62ml/hr  Tremors -Most likely secondary to patient's extremely low BP have resolved   Diarrhea -Positive for C. difficile, colonized per ID   Paraplegia -Patient has paraplegia, has chronic Foley catheter in place; when last change?   Code Status: FULL Family Communication: no family present at time of exam Disposition Plan: Resolution sepsis    Consultants: Dr.Cornelius N [facility] (ID)    Procedure/Significant Events: 05/14/2009 MRI T-spine/L-spine with and without contrast;Large extramedullary fluid collection in the thoracic spine from T4- T11. This is compressing the cord. -marked cord atrophy especially at T7-8 and below. Could be related to chronic hematoma as some of the signal characteristics suggests layering blood.  -non cystic mass within the spinal canal compressing the cord at the T7 level could be hematoma.  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 -Extensive enhancement large fluid collection between L2-L3 vertebral bodies extending into the facet joints bilaterally, concerning for osteomyelitis and abscess,    Culture 2/10 blood left antecubital/arm NGTD 2/10 urine multiple bacterial morphotypes 2/10 C. difficile positive by PCR, (ID believes colonization)   Antibiotics: Zosyn 2/10>> stopped 2/12 Vancomycin 2/10>> stopped 2/12  DVT prophylaxis: Lovenox   Devices    LINES / TUBES:      Continuous Infusions: .  sodium chloride 75 mL/hr at 05/09/14 0031     Objective: VITAL SIGNS: Temp: 99 F (37.2 C) (02/12 2002) Temp Source: Oral (02/12 2002) BP: 128/65 mmHg (02/12 2002) Pulse Rate: 84 (02/12 2002) SPO2; FIO2:   Intake/Output Summary (Last 24 hours) at 05/09/14 2057 Last data filed at 05/09/14 2003  Gross per 24 hour  Intake    825 ml  Output   1175 ml  Net   -350 ml     Exam: General: A/O 4, NAD, No acute respiratory distress Lungs: Clear to auscultation bilaterally without wheezes or crackles Cardiovascular: Regular rate and rhythm without murmur gallop or rub normal S1 and S2 Abdomen: Morbidly Obese, Nontender, nondistended, soft, bowel sounds positive, no rebound, no ascites, no appreciable mass Extremities: No significant cyanosis, positive clubbing, positive 3+  bilateral lower extremities edema     Data Reviewed: Basic Metabolic Panel:  Recent Labs Lab 05/07/14 1330 05/07/14 1559 05/08/14 0505 05/09/14 0900  NA 130* 134* 140 140  K 4.5 4.5 4.3 3.9  CL 100 98 107 110  CO2 20  --  22 17*  GLUCOSE 80 82 85 72  BUN 64* 61* 51* 19  CREATININE 1.32* 1.50* 0.89 0.71  CALCIUM 8.7  --  8.5 8.8  MG  --   --   --  1.8   Liver Function Tests:  Recent Labs Lab 05/07/14 1330 05/08/14 0505 05/09/14 0900  AST 18 14 16   ALT 27 22 17   ALKPHOS 211* 183* 175*  BILITOT 0.8 0.7 1.0  PROT 7.0 6.6 6.7  ALBUMIN 2.6* 2.5* 2.4*   No results for input(s): LIPASE, AMYLASE in the last 168 hours. No results for input(s): AMMONIA in the last 168 hours. CBC:  Recent Labs Lab 05/07/14 1330 05/07/14 1559 05/08/14 0505 05/09/14 0900  WBC 22.1*  --  13.6* 8.3  NEUTROABS 17.8*  --   --   --   HGB 9.0* 10.2* 9.0* 9.3*  HCT 28.6* 30.0* 29.2* 30.8*  MCV 85.6  --  86.6 87.3  PLT 331  --  304 345   Cardiac Enzymes:  Recent Labs Lab 05/07/14 1330  TROPONINI <0.03   BNP (last 3 results)  Recent Labs  03/29/14 0429  BNP 73.0    ProBNP (last 3 results) No results for input(s): PROBNP in the last 8760  hours.  CBG: No results for input(s): GLUCAP in the last 168 hours.  Recent Results (from the past 240 hour(s))  Culture, blood (routine x 2)     Status: None (Preliminary result)   Collection Time: 05/07/14  3:30 PM  Result Value Ref Range Status   Specimen Description BLOOD LEFT ANTECUBITAL  Final   Special Requests BOTTLES DRAWN AEROBIC AND ANAEROBIC 6CC  Final   Culture NO GROWTH 2 DAYS  Final   Report Status PENDING  Incomplete  Culture, blood (routine x 2)     Status: None (Preliminary result)   Collection Time: 05/07/14  3:45 PM  Result Value Ref Range Status   Specimen Description BLOOD LEFT ARM DRAWN BY RN DMA  Final   Special Requests BOTTLES DRAWN AEROBIC AND ANAEROBIC 6CC  Final   Culture NO GROWTH 2 DAYS  Final   Report Status PENDING  Incomplete  Urine culture     Status: None   Collection Time: 05/07/14  4:38 PM  Result Value Ref Range Status   Specimen Description URINE, CATHETERIZED  Final   Special Requests NONE  Final   Colony Count  Final    70,000 COLONIES/ML Performed at [facility]    Culture   Final    Multiple bacterial morphotypes present, none predominant. Suggest appropriate recollection if clinically indicated. Performed at [facility]    Report Status 05/09/2014 FINAL  Final  Clostridium Difficile by PCR     Status: Abnormal   Collection Time: 05/07/14  9:30 PM  Result Value Ref Range Status   C difficile by pcr POSITIVE (A) NEGATIVE Final    Comment: RESULT CALLED TO, READ BACK BY AND VERIFIED WITH: KEITH A AT 0154 ON [id number] BY FORSYTH K   MRSA PCR Screening     Status: None   Collection Time: 05/07/14  9:30 PM  Result Value Ref Range Status   MRSA by PCR NEGATIVE NEGATIVE Final    Comment:        The GeneXpert MRSA Assay (FDA approved for NASAL specimens only), is one component of a comprehensive MRSA colonization surveillance program. It is not intended to diagnose MRSA infection nor to guide or monitor treatment  for MRSA infections.      Studies:  Recent x-ray studies have been reviewed in detail by the Attending Physician  Scheduled Meds:  Scheduled Meds: . antiseptic oral rinse  7 mL Mouth Rinse BID  . aspirin EC  81 mg Oral Daily  . baclofen  10 mg Oral BID  . buPROPion  150 mg Oral Daily  . enoxaparin (LOVENOX) injection  50 mg Subcutaneous Q24H  . ezetimibe  10 mg Oral Daily  . gabapentin  400 mg Oral TID  . levothyroxine  200 mcg Oral QAC breakfast  . levothyroxine  25 mcg Oral QAC breakfast  . LORazepam  1 mg Intravenous Once  . QUEtiapine  75 mg Oral QHS    Time spent on care of this patient: 40 mins   Allie Bossier [facility]  [facility] Office  [phone] Pager - [phone]  On-Call/Text Page:      [email or web address]      password [email or web address]  If 7PM-7AM, please contact night-coverage [email or web address] Password [facility] 05/09/2014, 8:57 PM   LOS: 2 days   Care during the described time interval was provided by me .  I have reviewed this patient's available data, including medical history, events of note, physical examination, radiology studies and test results as part of my evaluation  Dia Crawford, MD [phone] Pager

## 2014-05-09 NOTE — Consult Note (Signed)
[facility] for Infectious Disease    Date of Admission:  05/07/2014  Date of Consult:  05/09/2014  Reason for Consult: Lumbar discitis that is progressive Referring Physician: Dr Sherral Hammers   HPI: [name] is an 69 y.o. female. With history of paraplegia due to spinal stenosis bipolar disorder diabetes mellitus obesity COPD who is brought to [facility] after complaining of tremors in both upper extremities for 3 days. In the ER she was hypotensive and had an abnormal urinalysis. She was having some auditory hallucinations. Patient was initially started on vancomycin and Zosyn.  Blood cultures had been taken at admission and are no growth to date. Urine culture only grew 75,000 colony-forming units.  In the interim she had an MRI performed of her spine which showed:     Large extramedullary fluid collection in the thoracic spine from T4 through T11. This is compressing the cord. There is marked cord atrophy especially at T7-8 and below. This fluid collection could be related to chronic hematoma as some of the signal characteristics suggests layering blood. There is also a non cystic mass within the spinal canal compressing the cord at the T7 level which could be hematoma. Calcified disc or epidural mass are other considerations.  The marked destructive process at L2-3 with severe spinal stenosis. There is extensive enhancement of a large fluid collection between the L2 and L3 vertebral bodies extending into the facet joints bilaterally, concerning for osteomyelitis and abscess,    Dr. Julaine Fusi of neurosurgery view the films and felt that the L1-L2 findings were consistent with acute osteomyelitis abscess. He do not feel that neurosurgery would improve her clinical situation. He recommended consultation with [place].  Patient currently is in the stepdown unit and is hemodynamically stable. Apparently she was constipated and underwent a disimpaction. The stool that was taken from  her after she was disimpacted has been tested for C. difficile and been found positive for C. difficile PCR though she was not having diarrhea and still does not have diarrhea but rather constipation.   Past Medical History  Diagnosis Date  . Paraplegic spinal paralysis   . Hypertension   . Bipolar 1 disorder   . Thyroid disease   . Diabetes mellitus without complication   . Obesity   . Pyelonephritis   . COPD with asthma   . GERD (gastroesophageal reflux disease)     Past Surgical History  Procedure Laterality Date  . Back surgery    . Abdominal hysterectomy    ergies:   Allergies  Allergen Reactions  . Codeine   . Propranolol Hcl      Medications: I have reviewed patients current medications as documented in Epic Anti-infectives    Start     Dose/Rate Route Frequency Ordered Stop   05/08/14 1000  vancomycin (VANCOCIN) 1,250 mg in sodium chloride 0.9 % 250 mL IVPB  Status:  Discontinued     1,250 mg 166.7 mL/hr over 90 Minutes Intravenous Every 24 hours 05/08/14 0043 05/08/14 0813   05/08/14 1000  vancomycin (VANCOCIN) 1,250 mg in sodium chloride 0.9 % 250 mL IVPB  Status:  Discontinued     1,250 mg 166.7 mL/hr over 90 Minutes Intravenous Every 12 hours 05/08/14 0813 05/09/14 1526   05/08/14 1000  vancomycin (VANCOCIN) 50 mg/mL oral solution 125 mg  Status:  Discontinued     125 mg Oral 4 times daily 05/08/14 0942 05/09/14 1526   05/08/14 0100  piperacillin-tazobactam (ZOSYN) IVPB 3.375 g  Status:  Discontinued     3.375 g 12.5 mL/hr over 240 Minutes Intravenous Every 8 hours 05/08/14 0033 05/09/14 1526   05/07/14 1530  piperacillin-tazobactam (ZOSYN) IVPB 3.375 g     3.375 g 100 mL/hr over 30 Minutes Intravenous  Once 05/07/14 1519 05/07/14 1620   05/07/14 1530  vancomycin (VANCOCIN) IVPB 1000 mg/200 mL premix     1,000 mg 200 mL/hr over 60 Minutes Intravenous  Once 05/07/14 1519 05/07/14 1815      Social History:  reports that she has quit smoking. She does not  have any smokeless tobacco history on file. She reports that she does not drink alcohol or use illicit drugs.  Family History  Problem Relation Age of Onset  . Heart disease Mother     As in HPI and primary teams notes otherwise 12 point review of systems is negative  Blood pressure 123/67, pulse 94, temperature 98.7 F (37.1 C), temperature source Oral, resp. rate 23, height '5\' 7"'  (1.702 m), weight 238 lb 5.1 oz (108.1 kg), SpO2 100 %. General: Alert and awake, oriented x3, not in any acute distress. HEENT: anicteric sclera, , EOMI, oropharynx clear and without exudate CVS regular rate, normal r,  no murmur rubs or gallops Chest: clear to auscultation bilaterally, no wheezing, rales or rhonchi Abdomen: soft nontender, nondistended, normal bowel sounds, Extremities: No ulcers or breakdown  Skin: no rashes Neuro: Paraplegic   Results for orders placed or performed during the hospital encounter of 05/07/14 (from the past 48 hour(s))  I-Stat CG4 Lactic Acid, ED     Status: None   Collection Time: 05/07/14  7:29 PM  Result Value Ref Range   Lactic Acid, Venous 0.76 0.5 - 2.0 mmol/L  Clostridium Difficile by PCR     Status: Abnormal   Collection Time: 05/07/14  9:30 PM  Result Value Ref Range   C difficile by pcr POSITIVE (A) NEGATIVE    Comment: RESULT CALLED TO, READ BACK BY AND VERIFIED WITH: KEITH A AT 0154 ON [id number] BY FORSYTH K   MRSA PCR Screening     Status: None   Collection Time: 05/07/14  9:30 PM  Result Value Ref Range   MRSA by PCR NEGATIVE NEGATIVE    Comment:        The GeneXpert MRSA Assay (FDA approved for NASAL specimens only), is one component of a comprehensive MRSA colonization surveillance program. It is not intended to diagnose MRSA infection nor to guide or monitor treatment for MRSA infections.   CBC     Status: Abnormal   Collection Time: 05/08/14  5:05 AM  Result Value Ref Range   WBC 13.6 (H) 4.0 - 10.5 K/uL   RBC 3.37 (L) 3.87 - 5.11 MIL/uL    Hemoglobin 9.0 (L) 12.0 - 15.0 g/dL   HCT 29.2 (L) 36.0 - 46.0 %   MCV 86.6 78.0 - 100.0 fL   MCH 26.7 26.0 - 34.0 pg   MCHC 30.8 30.0 - 36.0 g/dL   RDW 16.5 (H) 11.5 - 15.5 %   Platelets 304 150 - 400 K/uL  Comprehensive metabolic panel     Status: Abnormal   Collection Time: 05/08/14  5:05 AM  Result Value Ref Range   Sodium 140 135 - 145 mmol/L   Potassium 4.3 3.5 - 5.1 mmol/L   Chloride 107 96 - 112 mmol/L    Comment: DELTA CHECK NOTED   CO2 22 19 - 32 mmol/L   Glucose, Bld 85 70 - 99 mg/dL  BUN 51 (H) 6 - 23 mg/dL   Creatinine, Ser 0.89 0.50 - 1.10 mg/dL   Calcium 8.5 8.4 - 10.5 mg/dL   Total Protein 6.6 6.0 - 8.3 g/dL   Albumin 2.5 (L) 3.5 - 5.2 g/dL   AST 14 0 - 37 U/L   ALT 22 0 - 35 U/L   Alkaline Phosphatase 183 (H) 39 - 117 U/L   Total Bilirubin 0.7 0.3 - 1.2 mg/dL   GFR calc non Af Amer 66 (L) >90 mL/min   GFR calc Af Amer 77 (L) >90 mL/min    Comment: (NOTE) The eGFR has been calculated using the CKD EPI equation. This calculation has not been validated in all clinical situations. eGFR's persistently <90 mL/min signify possible Chronic Kidney Disease.    Anion gap 11 5 - 15  Comprehensive metabolic panel     Status: Abnormal   Collection Time: 05/09/14  9:00 AM  Result Value Ref Range   Sodium 140 135 - 145 mmol/L   Potassium 3.9 3.5 - 5.1 mmol/L   Chloride 110 96 - 112 mmol/L   CO2 17 (L) 19 - 32 mmol/L   Glucose, Bld 72 70 - 99 mg/dL   BUN 19 6 - 23 mg/dL   Creatinine, Ser 0.71 0.50 - 1.10 mg/dL   Calcium 8.8 8.4 - 10.5 mg/dL   Total Protein 6.7 6.0 - 8.3 g/dL   Albumin 2.4 (L) 3.5 - 5.2 g/dL   AST 16 0 - 37 U/L   ALT 17 0 - 35 U/L   Alkaline Phosphatase 175 (H) 39 - 117 U/L   Total Bilirubin 1.0 0.3 - 1.2 mg/dL   GFR calc non Af Amer 88 (L) >90 mL/min   GFR calc Af Amer >90 >90 mL/min    Comment: (NOTE) The eGFR has been calculated using the CKD EPI equation. This calculation has not been validated in all clinical situations. eGFR's  persistently <90 mL/min signify possible Chronic Kidney Disease.    Anion gap 13 5 - 15  CBC     Status: Abnormal   Collection Time: 05/09/14  9:00 AM  Result Value Ref Range   WBC 8.3 4.0 - 10.5 K/uL   RBC 3.53 (L) 3.87 - 5.11 MIL/uL   Hemoglobin 9.3 (L) 12.0 - 15.0 g/dL   HCT 30.8 (L) 36.0 - 46.0 %   MCV 87.3 78.0 - 100.0 fL   MCH 26.3 26.0 - 34.0 pg   MCHC 30.2 30.0 - 36.0 g/dL   RDW 17.0 (H) 11.5 - 15.5 %   Platelets 345 150 - 400 K/uL  Magnesium     Status: None   Collection Time: 05/09/14  9:00 AM  Result Value Ref Range   Magnesium 1.8 1.5 - 2.5 mg/dL  Lactic acid, plasma     Status: None   Collection Time: 05/09/14  9:00 AM  Result Value Ref Range   Lactic Acid, Venous 0.8 0.5 - 2.0 mmol/L   '@BRIEFLABTABLE' (sdes,specrequest,cult,reptstatus)   ) Recent Results (from the past 720 hour(s))  Culture, blood (routine x 2)     Status: None (Preliminary result)   Collection Time: 05/07/14  3:30 PM  Result Value Ref Range Status   Specimen Description BLOOD LEFT ANTECUBITAL  Final   Special Requests BOTTLES DRAWN AEROBIC AND ANAEROBIC 6CC  Final   Culture NO GROWTH 2 DAYS  Final   Report Status PENDING  Incomplete  Culture, blood (routine x 2)     Status: None (Preliminary result)  Collection Time: 05/07/14  3:45 PM  Result Value Ref Range Status   Specimen Description BLOOD LEFT ARM DRAWN BY RN DMA  Final   Special Requests BOTTLES DRAWN AEROBIC AND ANAEROBIC 6CC  Final   Culture NO GROWTH 2 DAYS  Final   Report Status PENDING  Incomplete  Urine culture     Status: None   Collection Time: 05/07/14  4:38 PM  Result Value Ref Range Status   Specimen Description URINE, CATHETERIZED  Final   Special Requests NONE  Final   Colony Count   Final    70,000 COLONIES/ML Performed at [facility]    Culture   Final    Multiple bacterial morphotypes present, none predominant. Suggest appropriate recollection if clinically indicated. Performed at [facility]    Report Status 05/09/2014 FINAL  Final  Clostridium Difficile by PCR     Status: Abnormal   Collection Time: 05/07/14  9:30 PM  Result Value Ref Range Status   C difficile by pcr POSITIVE (A) NEGATIVE Final    Comment: RESULT CALLED TO, READ BACK BY AND VERIFIED WITH: KEITH A AT 0154 ON [id number] BY FORSYTH K   MRSA PCR Screening     Status: None   Collection Time: 05/07/14  9:30 PM  Result Value Ref Range Status   MRSA by PCR NEGATIVE NEGATIVE Final    Comment:        The GeneXpert MRSA Assay (FDA approved for NASAL specimens only), is one component of a comprehensive MRSA colonization surveillance program. It is not intended to diagnose MRSA infection nor to guide or monitor treatment for MRSA infections.      Impression/Recommendation  Active Problems:   Paraplegic spinal paralysis   Thyroid disease   Sepsis   Diarrhea   Hypothyroid   ARF (acute renal failure)   Enteritis due to Clostridium difficile   UTI (urinary tract infection)   Chronic indwelling Foley catheter   [name] is a 69 y.o. female with  paraplegia and new lumbar discitis osteomyelitis and abscess, C. difficile PCR positive stool.  #1 Lumbar discitis osteomyelitis abscess:  --Discontinue her IV antibiotics while she is in the hospital and can be observed safely.  --Carefully follow-up her admission blood cultures and I would not react to them unless they were both positive  --Plan on interventional radiology guided aspirate of her disc space plus minus abscess for cultures to be obtained for bacteria fungi and AFB.  #2 C. difficile positive stool:  Her clinical syndrome did not fit with C. difficile colitis and her stool should not of been tested in this scenario. At present I feel that she is a patient who is C. difficile positive and colonized with C. difficile but is not showing evidence of active colitis.  Therefore I would not start her on oral vancomycin but I would keep  her on enteric precautions.  #3 Screening: check HIV and hep C   05/09/2014, 7:14 PM   Thank you so much for this interesting consult  Fernando Salinas for [place] [phone] (pager) [phone] (office) 05/09/2014, 7:14 PM  Rhina Brackett [place] 05/09/2014, 7:14 PM

## 2014-05-10 DIAGNOSIS — E079 Disorder of thyroid, unspecified: Secondary | ICD-10-CM

## 2014-05-10 DIAGNOSIS — Z9889 Other specified postprocedural states: Secondary | ICD-10-CM

## 2014-05-10 DIAGNOSIS — M869 Osteomyelitis, unspecified: Secondary | ICD-10-CM

## 2014-05-10 LAB — CBC WITH DIFFERENTIAL/PLATELET
BASOS PCT: 1 % (ref 0–1)
Basophils Absolute: 0 10*3/uL (ref 0.0–0.1)
Eosinophils Absolute: 0.1 10*3/uL (ref 0.0–0.7)
Eosinophils Relative: 2 % (ref 0–5)
HCT: 29 % — ABNORMAL LOW (ref 36.0–46.0)
Hemoglobin: 9 g/dL — ABNORMAL LOW (ref 12.0–15.0)
Lymphocytes Relative: 25 % (ref 12–46)
Lymphs Abs: 2.1 10*3/uL (ref 0.7–4.0)
MCH: 26.9 pg (ref 26.0–34.0)
MCHC: 31 g/dL (ref 30.0–36.0)
MCV: 86.6 fL (ref 78.0–100.0)
MONO ABS: 0.7 10*3/uL (ref 0.1–1.0)
Monocytes Relative: 8 % (ref 3–12)
Neutro Abs: 5.7 10*3/uL (ref 1.7–7.7)
Neutrophils Relative %: 66 % (ref 43–77)
Platelets: 325 10*3/uL (ref 150–400)
RBC: 3.35 MIL/uL — AB (ref 3.87–5.11)
RDW: 16.9 % — AB (ref 11.5–15.5)
WBC: 8.7 10*3/uL (ref 4.0–10.5)

## 2014-05-10 LAB — COMPREHENSIVE METABOLIC PANEL
ALK PHOS: 149 U/L — AB (ref 39–117)
ALT: 16 U/L (ref 0–35)
AST: 17 U/L (ref 0–37)
Albumin: 2.2 g/dL — ABNORMAL LOW (ref 3.5–5.2)
Anion gap: 11 (ref 5–15)
BILIRUBIN TOTAL: 0.7 mg/dL (ref 0.3–1.2)
BUN: 9 mg/dL (ref 6–23)
CALCIUM: 8 mg/dL — AB (ref 8.4–10.5)
CHLORIDE: 105 mmol/L (ref 96–112)
CO2: 19 mmol/L (ref 19–32)
CREATININE: 0.51 mg/dL (ref 0.50–1.10)
GFR calc Af Amer: >90 mL/min (ref >90)
Glucose, Bld: 101 mg/dL — ABNORMAL HIGH (ref 70–99)
Potassium: 3.4 mmol/L — ABNORMAL LOW (ref 3.5–5.1)
SODIUM: 135 mmol/L (ref 135–145)
Total Protein: 6.4 g/dL (ref 6.0–8.3)

## 2014-05-10 LAB — MAGNESIUM: Magnesium: 1.4 mg/dL — ABNORMAL LOW (ref 1.5–2.5)

## 2014-05-10 LAB — TSH: TSH: 0.547 u[IU]/mL (ref 0.350–4.500)

## 2014-05-10 MED ORDER — POLYVINYL ALCOHOL 1.4 % OP SOLN
1.0000 [drp] | OPHTHALMIC | Status: DC | PRN
  Administered 2014-05-16 – 2014-05-18 (×2): 1 [drp] via OPHTHALMIC
  Filled 2014-05-10 (×2): qty 15

## 2014-05-10 MED ORDER — DIPHENHYDRAMINE HCL 25 MG PO CAPS
25.0000 mg | ORAL_CAPSULE | Freq: Four times a day (QID) | ORAL | Status: DC | PRN
  Administered 2014-05-10 – 2014-05-17 (×13): 25 mg via ORAL
  Filled 2014-05-10 (×13): qty 1

## 2014-05-10 MED ORDER — GERHARDT'S BUTT CREAM
TOPICAL_CREAM | Freq: Every day | CUTANEOUS | Status: DC
  Administered 2014-05-10 – 2014-05-13 (×4): via TOPICAL
  Administered 2014-05-14: 1 via TOPICAL
  Administered 2014-05-15 – 2014-05-17 (×3): via TOPICAL
  Administered 2014-05-18: 1 via TOPICAL
  Administered 2014-05-19 – 2014-05-20 (×2): via TOPICAL
  Filled 2014-05-10 (×6): qty 1

## 2014-05-10 NOTE — Progress Notes (Signed)
TEAM 1 - Stepdown/ICU TEAM Progress Note  [name] SWF:[id number] DOB: 1946/03/09 DOA: 05/07/2014 PCP: [name], MD  Admit HPI / Brief Narrative: 69 year old WF PMHx ; Bipolar 1 disorder, anxiety, chronic pain syndrome, Paraplegic spinal paralysis; Hypertension; Thyroid disease; Diabetes mellitus without complication; Obesity; Pyelonephritis; COPD with asthma; and GERD (gastroesophageal reflux disease). Patient was brought from [place] home after she complained of jerking tremors to both upper extremities for past 3 days. In the ED patient was found to be hypotensive with blood pressure 82/52, also patient found to have abnormal UA and complained of auditory hallucinations. Patient started on vancomycin and Zosyn for sepsis due to UTI. Lactic acid was normal with 1.55 and repeat lactic acid 0.76. Patient did have high white count of 22.1. She complains of diarrhea for past few days. Denies nausea or vomiting. Patient is alert and oriented 3, able to answer all the questions she is mentating very well despite the low blood pressure. Abdominal x-ray showed advanced destructive changes in the L1-L2 level, with recommendation for MRI for possible osteoma mellitus/discitis for further evaluation. Patient has chronic Foley catheter, she was diagnosed with UTI recently on January 27.   HPI/Subjective: 2/13 A/O 4, no complaints  Assessment/Plan: Bipolar 1 disorder -Continue Seroquel 75 mg  QHS   Anxiety -Continue Lorazepam 0.5 mg q 4 hr PRN   Chronic pain syndrome -Description patient's arm pain more consistent with neuropathic pain  -Oxycodone 5 mg q 6 hr PRN Pain (home regimen) -Continue Neurontin 400 mg TID -Continue Zanaflex 2 mg q 6hr PRN  Sepsis unspecified organism -Sepsis physiology beginning to resolve -Continue to monitor CBC, CMP, magnesium  Destructive changes at L1-L2 on x-ray -X-ray of the abdomen showed destructive changes of L1-L2, will order MRI  of the lumbar spine to rule out underlying osteitis/discitis.  -Per ID DC all antibiotics will obtain fluid from hematoma and back on Monday or Tuesday by IR.  Acute kidney injury(baseline creatinine is 0.71) -Patient Creatinine now within normal limits.  -Continue home saline 75 ml/hr  Hypothyroidism -TSH within normal limit -Continue Synthroid 225 g daily  HTN -Continue to hold BP medication -Continue normal saline 11ml/hr  Tremors -Most likely secondary to patient's extremely low BP have resolved   Diarrhea -Positive for C. difficile, colonized per ID   Paraplegia -Patient has paraplegia, has chronic Foley catheter in place; when last change?   Code Status: FULL Family Communication: no family present at time of exam Disposition Plan: Resolution sepsis    Consultants: Dr.Cornelius N [facility] (ID)    Procedure/Significant Events: 05/14/2009 MRI T-spine/L-spine with and without contrast;Large extramedullary fluid collection in the thoracic spine from T4- T11. This is compressing the cord. -marked cord atrophy especially at T7-8 and below. Could be related to chronic hematoma as some of the signal characteristics suggests layering blood.  -non cystic mass within the spinal canal compressing the cord at the T7 level could be hematoma.  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 -Extensive enhancement large fluid collection between L2-L3 vertebral bodies extending into the facet joints bilaterally, concerning for osteomyelitis and abscess,    Culture 2/10 blood left antecubital/arm NGTD 2/10 urine multiple bacterial morphotypes 2/10 C. difficile positive by PCR, (ID believes colonization)   Antibiotics: Zosyn 2/10>> stopped 2/12 Vancomycin 2/10>> stopped 2/12  DVT prophylaxis: Lovenox   Devices    LINES / TUBES:      Continuous Infusions: . sodium chloride 75 mL/hr at 05/09/14 2202  Objective: VITAL SIGNS: Temp: 98.9 F (37.2 C)  (02/13 1700) Temp Source: Oral (02/13 1700) BP: 120/84 mmHg (02/13 1241) Pulse Rate: 89 (02/13 1241) SPO2; FIO2:   Intake/Output Summary (Last 24 hours) at 05/10/14 1748 Last data filed at 05/10/14 1700  Gross per 24 hour  Intake   1125 ml  Output   2150 ml  Net  -1025 ml     Exam: General: A/O 4, NAD, No acute respiratory distress Lungs: Clear to auscultation bilaterally without wheezes or crackles Cardiovascular: Regular rate and rhythm without murmur gallop or rub normal S1 and S2 Abdomen: Morbidly Obese, Nontender, nondistended, soft, bowel sounds positive, no rebound, no ascites, no appreciable mass Extremities: No significant cyanosis, positive clubbing, positive 3+  bilateral lower extremities edema     Data Reviewed: Basic Metabolic Panel:  Recent Labs Lab 05/07/14 1330 05/07/14 1559 05/08/14 0505 05/09/14 0900 05/10/14 0320  NA 130* 134* 140 140 135  K 4.5 4.5 4.3 3.9 3.4*  CL 100 98 107 110 105  CO2 20  --  22 17* 19  GLUCOSE 80 82 85 72 101*  BUN 64* 61* 51* 19 9  CREATININE 1.32* 1.50* 0.89 0.71 0.51  CALCIUM 8.7  --  8.5 8.8 8.0*  MG  --   --   --  1.8 1.4*   Liver Function Tests:  Recent Labs Lab 05/07/14 1330 05/08/14 0505 05/09/14 0900 05/10/14 0320  AST 18 14 16 17   ALT 27 22 17 16   ALKPHOS 211* 183* 175* 149*  BILITOT 0.8 0.7 1.0 0.7  PROT 7.0 6.6 6.7 6.4  ALBUMIN 2.6* 2.5* 2.4* 2.2*   No results for input(s): LIPASE, AMYLASE in the last 168 hours. No results for input(s): AMMONIA in the last 168 hours. CBC:  Recent Labs Lab 05/07/14 1330 05/07/14 1559 05/08/14 0505 05/09/14 0900 05/10/14 0320  WBC 22.1*  --  13.6* 8.3 8.7  NEUTROABS 17.8*  --   --   --  5.7  HGB 9.0* 10.2* 9.0* 9.3* 9.0*  HCT 28.6* 30.0* 29.2* 30.8* 29.0*  MCV 85.6  --  86.6 87.3 86.6  PLT 331  --  304 345 325   Cardiac Enzymes:  Recent Labs Lab 05/07/14 1330  TROPONINI <0.03   BNP (last 3 results)  Recent Labs  03/29/14 0429  BNP 73.0     ProBNP (last 3 results) No results for input(s): PROBNP in the last 8760 hours.  CBG: No results for input(s): GLUCAP in the last 168 hours.  Recent Results (from the past 240 hour(s))  Culture, blood (routine x 2)     Status: None (Preliminary result)   Collection Time: 05/07/14  3:30 PM  Result Value Ref Range Status   Specimen Description BLOOD LEFT ANTECUBITAL  Final   Special Requests BOTTLES DRAWN AEROBIC AND ANAEROBIC 6CC  Final   Culture NO GROWTH 3 DAYS  Final   Report Status PENDING  Incomplete  Culture, blood (routine x 2)     Status: None (Preliminary result)   Collection Time: 05/07/14  3:45 PM  Result Value Ref Range Status   Specimen Description BLOOD LEFT ARM DRAWN BY RN DMA  Final   Special Requests BOTTLES DRAWN AEROBIC AND ANAEROBIC 6CC  Final   Culture NO GROWTH 3 DAYS  Final   Report Status PENDING  Incomplete  Urine culture     Status: None   Collection Time: 05/07/14  4:38 PM  Result Value Ref Range Status   Specimen Description URINE,  CATHETERIZED  Final   Special Requests NONE  Final   Colony Count   Final    70,000 COLONIES/ML Performed at [facility]    Culture   Final    Multiple bacterial morphotypes present, none predominant. Suggest appropriate recollection if clinically indicated. Performed at [facility]    Report Status 05/09/2014 FINAL  Final  Clostridium Difficile by PCR     Status: Abnormal   Collection Time: 05/07/14  9:30 PM  Result Value Ref Range Status   C difficile by pcr POSITIVE (A) NEGATIVE Final    Comment: RESULT CALLED TO, READ BACK BY AND VERIFIED WITH: KEITH A AT 0154 ON [id number] BY FORSYTH K   MRSA PCR Screening     Status: None   Collection Time: 05/07/14  9:30 PM  Result Value Ref Range Status   MRSA by PCR NEGATIVE NEGATIVE Final    Comment:        The GeneXpert MRSA Assay (FDA approved for NASAL specimens only), is one component of a comprehensive MRSA colonization surveillance program. It  is not intended to diagnose MRSA infection nor to guide or monitor treatment for MRSA infections.      Studies:  Recent x-ray studies have been reviewed in detail by the Attending Physician  Scheduled Meds:  Scheduled Meds: . antiseptic oral rinse  7 mL Mouth Rinse BID  . aspirin EC  81 mg Oral Daily  . baclofen  10 mg Oral BID  . buPROPion  150 mg Oral Daily  . enoxaparin (LOVENOX) injection  50 mg Subcutaneous Q24H  . ezetimibe  10 mg Oral Daily  . gabapentin  400 mg Oral TID  . Gerhardt's butt cream   Topical Daily  . levothyroxine  200 mcg Oral QAC breakfast  . levothyroxine  25 mcg Oral QAC breakfast  . LORazepam  1 mg Intravenous Once  . QUEtiapine  75 mg Oral QHS    Time spent on care of this patient: 40 mins   Allie Bossier [facility]  [facility] Office  [phone] Pager - [phone]  On-Call/Text Page:      [email or web address]      password [email or web address]  If 7PM-7AM, please contact night-coverage [email or web address] Password [facility] 05/10/2014, 5:48 PM   LOS: 3 days   Care during the described time interval was provided by me .  I have reviewed this patient's available data, including medical history, events of note, physical examination, radiology studies and test results as part of my evaluation  Dia Crawford, MD [phone] Pager

## 2014-05-10 NOTE — Progress Notes (Signed)
Pt converted spontaneously back to NSR.

## 2014-05-10 NOTE — Progress Notes (Signed)
Began receiving CCMD alarms regarding elevated heart rate. Upon assessment pts heart rate was ranging in the 103-140's. 12 lead EKG obtained and Dr. Sherral Hammers notified of results. New order received, wll implement and continue to monitor.

## 2014-05-11 DIAGNOSIS — G061 Intraspinal abscess and granuloma: Secondary | ICD-10-CM | POA: Diagnosis present

## 2014-05-11 DIAGNOSIS — IMO0001 Reserved for inherently not codable concepts without codable children: Secondary | ICD-10-CM | POA: Diagnosis present

## 2014-05-11 MED ORDER — GABAPENTIN 100 MG PO CAPS
500.0000 mg | ORAL_CAPSULE | Freq: Three times a day (TID) | ORAL | Status: DC
Start: 2014-05-11 — End: 2014-05-12
  Administered 2014-05-11 – 2014-05-12 (×3): 500 mg via ORAL
  Filled 2014-05-11 (×4): qty 1

## 2014-05-11 NOTE — Progress Notes (Signed)
[place] TEAM 1 - Stepdown/ICU TEAM Progress Note  [name] ALP:[id number] DOB: Aug 13, 1945 DOA: 05/07/2014 PCP: [name], MD  Admit HPI / Brief Narrative: 69 year old WF PMHx ; Bipolar 1 disorder, anxiety, chronic pain syndrome, Paraplegic spinal paralysis; Hypertension; Thyroid disease; Diabetes mellitus without complication; Obesity; Pyelonephritis; COPD with asthma; and GERD (gastroesophageal reflux disease). Patient was brought from [place] home after she complained of jerking tremors to both upper extremities for past 3 days. In the ED patient was found to be hypotensive with blood pressure 82/52, also patient found to have abnormal UA and complained of auditory hallucinations. Patient started on vancomycin and Zosyn for sepsis due to UTI. Lactic acid was normal with 1.55 and repeat lactic acid 0.76. Patient did have high white count of 22.1. She complains of diarrhea for past few days. Denies nausea or vomiting. Patient is alert and oriented 3, able to answer all the questions she is mentating very well despite the low blood pressure. Abdominal x-ray showed advanced destructive changes in the L1-L2 level, with recommendation for MRI for possible osteoma mellitus/discitis for further evaluation. Patient has chronic Foley catheter, she was diagnosed with UTI recently on January 27.   HPI/Subjective: 2/14 A/O 4, patient sitting comfortably in bed eating dinner however demands more narcotic medication.   Assessment/Plan: Bipolar 1 disorder -Continue Seroquel 75 mg  QHS   Anxiety -Continue Lorazepam 0.5 mg q 4 hr PRN   Chronic pain syndrome (primary team should only adjust pain medication) -Description patient's arm pain more consistent with neuropathic pain  -Oxycodone 5 mg q 6 hr PRN Pain (home regimen as prescribed) -Increase Neurontin 500 mg TID -Continue Zanaflex 2 mg q 6hr PRN  Drug seeking behavior -Patient continues to demand increasing doses of narcotics  even though the description of her pain is neuropathic and her gabapentin has been increased over her home dose on admission.  Sepsis unspecified organism -Sepsis physiology resolved -Continue to monitor CBC, CMP, magnesium  Destructive changes at L1-L2 on x-ray -X-ray of the abdomen showed destructive changes of L1-L2,  - MRI of the lumbar spine possible abscess/osteitis/discitis.  -Per ID DC all antibiotics will obtain fluid from hematoma and back on Monday or Tuesday by IR.  Acute kidney injury(baseline creatinine is 0.71) -Patient Creatinine now within normal limits.  -Patient eating and drinking normally decrease normal saline to KVO   Hypothyroidism -TSH within normal limit -Continue Synthroid 225 g daily  HTN -Controlled without BP medication -Continue normal saline at [place] -Most likely secondary to patient's extremely low BP have resolved   Diarrhea -Positive for C. difficile, colonized per ID   Paraplegia -Patient has paraplegia, has chronic Foley catheter in place; when last change?   Code Status: FULL Family Communication: no family present at time of exam Disposition Plan: Resolution sepsis    Consultants: Dr.Cornelius N [facility] (ID)    Procedure/Significant Events: 05/14/2009 MRI T-spine/L-spine with and without contrast;Large extramedullary fluid collection in the thoracic spine from T4- T11. This is compressing the cord. -marked cord atrophy especially at T7-8 and below. Could be related to chronic hematoma as some of the signal characteristics suggests layering blood.  -non cystic mass within the spinal canal compressing the cord at the T7 level could be hematoma.  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 -Extensive enhancement large fluid collection between L2-L3 vertebral bodies extending into the facet joints bilaterally, concerning for osteomyelitis and abscess,    Culture 2/10 blood left antecubital/arm NGTD 2/10  urine multiple bacterial morphotypes 2/10 C. difficile positive by PCR, (ID believes colonization)   Antibiotics: Zosyn 2/10>> stopped 2/12 Vancomycin 2/10>> stopped 2/12  DVT prophylaxis: Discontinue Lovenox 2/14 for procedure on Monday or Tuesday by IR SCD  Devices    LINES / TUBES:      Continuous Infusions: . sodium chloride 75 mL/hr at 05/11/14 0500    Objective: VITAL SIGNS: Temp: 98.3 F (36.8 C) (02/14 1423) Temp Source: Oral (02/14 1423) SPO2; FIO2:   Intake/Output Summary (Last 24 hours) at 05/11/14 1851 Last data filed at 05/11/14 1444  Gross per 24 hour  Intake 1688.75 ml  Output   1525 ml  Net 163.75 ml     Exam: General: A/O 4, NAD, No acute respiratory distress Lungs: Clear to auscultation bilaterally without wheezes or crackles Cardiovascular: Regular rate and rhythm without murmur gallop or rub normal S1 and S2 Abdomen: Morbidly Obese, Nontender, nondistended, soft, bowel sounds positive, no rebound, no ascites, no appreciable mass Extremities: No significant cyanosis, positive clubbing, positive 3+  bilateral lower extremities edema     Data Reviewed: Basic Metabolic Panel:  Recent Labs Lab 05/07/14 1330 05/07/14 1559 05/08/14 0505 05/09/14 0900 05/10/14 0320  NA 130* 134* 140 140 135  K 4.5 4.5 4.3 3.9 3.4*  CL 100 98 107 110 105  CO2 20  --  22 17* 19  GLUCOSE 80 82 85 72 101*  BUN 64* 61* 51* 19 9  CREATININE 1.32* 1.50* 0.89 0.71 0.51  CALCIUM 8.7  --  8.5 8.8 8.0*  MG  --   --   --  1.8 1.4*   Liver Function Tests:  Recent Labs Lab 05/07/14 1330 05/08/14 0505 05/09/14 0900 05/10/14 0320  AST 18 14 16 17   ALT 27 22 17 16   ALKPHOS 211* 183* 175* 149*  BILITOT 0.8 0.7 1.0 0.7  PROT 7.0 6.6 6.7 6.4  ALBUMIN 2.6* 2.5* 2.4* 2.2*   No results for input(s): LIPASE, AMYLASE in the last 168 hours. No results for input(s): AMMONIA in the last 168 hours. CBC:  Recent Labs Lab 05/07/14 1330 05/07/14 1559  05/08/14 0505 05/09/14 0900 05/10/14 0320  WBC 22.1*  --  13.6* 8.3 8.7  NEUTROABS 17.8*  --   --   --  5.7  HGB 9.0* 10.2* 9.0* 9.3* 9.0*  HCT 28.6* 30.0* 29.2* 30.8* 29.0*  MCV 85.6  --  86.6 87.3 86.6  PLT 331  --  304 345 325   Cardiac Enzymes:  Recent Labs Lab 05/07/14 1330  TROPONINI <0.03   BNP (last 3 results)  Recent Labs  03/29/14 0429  BNP 73.0    ProBNP (last 3 results) No results for input(s): PROBNP in the last 8760 hours.  CBG: No results for input(s): GLUCAP in the last 168 hours.  Recent Results (from the past 240 hour(s))  Culture, blood (routine x 2)     Status: None (Preliminary result)   Collection Time: 05/07/14  3:30 PM  Result Value Ref Range Status   Specimen Description BLOOD LEFT ANTECUBITAL  Final   Special Requests BOTTLES DRAWN AEROBIC AND ANAEROBIC 6CC  Final   Culture NO GROWTH 4 DAYS  Final   Report Status PENDING  Incomplete  Culture, blood (routine x 2)     Status: None (Preliminary result)   Collection Time: 05/07/14  3:45 PM  Result Value Ref Range Status   Specimen Description BLOOD LEFT ARM DRAWN BY RN DMA  Final   Special Requests BOTTLES  DRAWN AEROBIC AND ANAEROBIC 6CC  Final   Culture NO GROWTH 4 DAYS  Final   Report Status PENDING  Incomplete  Urine culture     Status: None   Collection Time: 05/07/14  4:38 PM  Result Value Ref Range Status   Specimen Description URINE, CATHETERIZED  Final   Special Requests NONE  Final   Colony Count   Final    70,000 COLONIES/ML Performed at [facility]    Culture   Final    Multiple bacterial morphotypes present, none predominant. Suggest appropriate recollection if clinically indicated. Performed at [facility]    Report Status 05/09/2014 FINAL  Final  Clostridium Difficile by PCR     Status: Abnormal   Collection Time: 05/07/14  9:30 PM  Result Value Ref Range Status   C difficile by pcr POSITIVE (A) NEGATIVE Final    Comment: RESULT CALLED TO, READ BACK  BY AND VERIFIED WITH: KEITH A AT 0154 ON [id number] BY FORSYTH K   MRSA PCR Screening     Status: None   Collection Time: 05/07/14  9:30 PM  Result Value Ref Range Status   MRSA by PCR NEGATIVE NEGATIVE Final    Comment:        The GeneXpert MRSA Assay (FDA approved for NASAL specimens only), is one component of a comprehensive MRSA colonization surveillance program. It is not intended to diagnose MRSA infection nor to guide or monitor treatment for MRSA infections.      Studies:  Recent x-ray studies have been reviewed in detail by the Attending Physician  Scheduled Meds:  Scheduled Meds: . antiseptic oral rinse  7 mL Mouth Rinse BID  . aspirin EC  81 mg Oral Daily  . baclofen  10 mg Oral BID  . buPROPion  150 mg Oral Daily  . ezetimibe  10 mg Oral Daily  . gabapentin  500 mg Oral TID  . Gerhardt's butt cream   Topical Daily  . levothyroxine  200 mcg Oral QAC breakfast  . levothyroxine  25 mcg Oral QAC breakfast  . LORazepam  1 mg Intravenous Once  . QUEtiapine  75 mg Oral QHS    Time spent on care of this patient: 40 mins   Allie Bossier [facility] [place]  [facility] Office  [phone] Pager - [phone]  On-Call/Text Page:      [email or web address]      password [email or web address]  If 7PM-7AM, please contact night-coverage [email or web address] Password [facility] 05/11/2014, 6:51 PM   LOS: 4 days   Care during the described time interval was provided by me .  I have reviewed this patient's available data, including medical history, events of note, physical examination, radiology studies and test results as part of my evaluation  Dia Crawford, MD [phone] Pager

## 2014-05-12 LAB — BASIC METABOLIC PANEL
Anion gap: 6 (ref 5–15)
BUN: <5 mg/dL — ABNORMAL LOW (ref 6–23)
CHLORIDE: 106 mmol/L (ref 96–112)
CO2: 25 mmol/L (ref 19–32)
Calcium: 8 mg/dL — ABNORMAL LOW (ref 8.4–10.5)
Creatinine, Ser: 0.31 mg/dL — ABNORMAL LOW (ref 0.50–1.10)
GFR calc non Af Amer: >90 mL/min (ref >90)
Glucose, Bld: 110 mg/dL — ABNORMAL HIGH (ref 70–99)
Potassium: 3.3 mmol/L — ABNORMAL LOW (ref 3.5–5.1)
Sodium: 137 mmol/L (ref 135–145)

## 2014-05-12 LAB — HEPATITIS C ANTIBODY (REFLEX): HCV AB: NEGATIVE

## 2014-05-12 LAB — HIV ANTIBODY (ROUTINE TESTING W REFLEX): HIV SCREEN 4TH GENERATION: NONREACTIVE

## 2014-05-12 MED ORDER — POTASSIUM CHLORIDE CRYS ER 20 MEQ PO TBCR
40.0000 meq | EXTENDED_RELEASE_TABLET | Freq: Once | ORAL | Status: AC
  Administered 2014-05-12: 40 meq via ORAL
  Filled 2014-05-12: qty 2

## 2014-05-12 MED ORDER — ENOXAPARIN SODIUM 60 MG/0.6ML ~~LOC~~ SOLN
50.0000 mg | SUBCUTANEOUS | Status: DC
  Filled 2014-05-12 (×2): qty 0.6

## 2014-05-12 MED ORDER — MAGNESIUM SULFATE 2 GM/50ML IV SOLN
2.0000 g | Freq: Once | INTRAVENOUS | Status: AC
  Administered 2014-05-12: 2 g via INTRAVENOUS
  Filled 2014-05-12: qty 50

## 2014-05-12 MED ORDER — GABAPENTIN 400 MG PO CAPS
400.0000 mg | ORAL_CAPSULE | Freq: Three times a day (TID) | ORAL | Status: DC
Start: 2014-05-12 — End: 2014-05-20
  Administered 2014-05-12 – 2014-05-20 (×24): 400 mg via ORAL
  Filled 2014-05-12 (×26): qty 1

## 2014-05-12 NOTE — Progress Notes (Signed)
Utilization review completed.  

## 2014-05-12 NOTE — Progress Notes (Signed)
Medicare Important Message given? YES  (If response is "NO", the following Medicare IM given date fields will be blank)  Date Medicare IM given: 05/12/14 Medicare IM given by:  Celinda Dethlefs  

## 2014-05-12 NOTE — Clinical Social Work Note (Signed)
Patient transferred to [facility] from [facility] on 05/09/13. Patient admitted from [facility] and plans return at dc.  CSW will update [facility] and follow for return. Eduard Clos, MSW, [place]

## 2014-05-12 NOTE — Progress Notes (Signed)
[facility] for Infectious Disease    Subjective: She has had 3 loose bm in past 24 hours now  Antibiotics:  Anti-infectives    Start     Dose/Rate Route Frequency Ordered Stop   05/08/14 1000  vancomycin (VANCOCIN) 1,250 mg in sodium chloride 0.9 % 250 mL IVPB  Status:  Discontinued     1,250 mg 166.7 mL/hr over 90 Minutes Intravenous Every 24 hours 05/08/14 0043 05/08/14 0813   05/08/14 1000  vancomycin (VANCOCIN) 1,250 mg in sodium chloride 0.9 % 250 mL IVPB  Status:  Discontinued     1,250 mg 166.7 mL/hr over 90 Minutes Intravenous Every 12 hours 05/08/14 0813 05/09/14 1526   05/08/14 1000  vancomycin (VANCOCIN) 50 mg/mL oral solution 125 mg  Status:  Discontinued     125 mg Oral 4 times daily 05/08/14 0942 05/09/14 1526   05/08/14 0100  piperacillin-tazobactam (ZOSYN) IVPB 3.375 g  Status:  Discontinued     3.375 g 12.5 mL/hr over 240 Minutes Intravenous Every 8 hours 05/08/14 0033 05/09/14 1526   05/07/14 1530  piperacillin-tazobactam (ZOSYN) IVPB 3.375 g     3.375 g 100 mL/hr over 30 Minutes Intravenous  Once 05/07/14 1519 05/07/14 1620   05/07/14 1530  vancomycin (VANCOCIN) IVPB 1000 mg/200 mL premix     1,000 mg 200 mL/hr over 60 Minutes Intravenous  Once 05/07/14 1519 05/07/14 1815      Medications: Scheduled Meds: . antiseptic oral rinse  7 mL Mouth Rinse BID  . aspirin EC  81 mg Oral Daily  . baclofen  10 mg Oral BID  . buPROPion  150 mg Oral Daily  . ezetimibe  10 mg Oral Daily  . gabapentin  500 mg Oral TID  . Gerhardt's butt cream   Topical Daily  . levothyroxine  200 mcg Oral QAC breakfast  . levothyroxine  25 mcg Oral QAC breakfast  . LORazepam  1 mg Intravenous Once  . QUEtiapine  75 mg Oral QHS   Continuous Infusions: . sodium chloride 10 mL/hr at 05/12/14 0500   PRN Meds:.acetaminophen **OR** acetaminophen, diphenhydrAMINE, ipratropium-albuterol, LORazepam, ondansetron **OR** ondansetron (ZOFRAN) IV, oxyCODONE, polyvinyl alcohol,  tiZANidine    Objective: Weight change:   Intake/Output Summary (Last 24 hours) at 05/12/14 1345 Last data filed at 05/12/14 1100  Gross per 24 hour  Intake 1374.83 ml  Output   2875 ml  Net -1500.17 ml   Blood pressure 125/78, pulse 97, temperature 98.5 F (36.9 C), temperature source Oral, resp. rate 32, height 5\' 7"  (1.702 m), weight 238 lb 5.1 oz (108.1 kg), SpO2 97 %. Temp:  [98 F (36.7 C)-100.3 F (37.9 C)] 98.5 F (36.9 C) (02/15 1100) Pulse Rate:  [87-97] 97 (02/15 0445) Resp:  [17-32] 32 (02/15 0445) BP: (125-146)/(74-82) 125/78 mmHg (02/15 0700) SpO2:  [97 %-100 %] 97 % (02/15 0445)  Physical Exam: General: Alert and awake, oriented x3, not in any acute distress. HEENT: anicteric sclera, , EOMI, oropharynx clear and without exudate CVS regular rate, normal r, no murmur rubs or gallops Chest: clear to auscultation bilaterally, no wheezing, rales or rhonchi Abdomen: soft nontender, nondistended, normal bowel sounds, Extremities: No ulcers or breakdown  Skin: no rashes Neuro: Paraplegic  CBC:  CBC Latest Ref Rng 05/10/2014 05/09/2014 05/08/2014  WBC 4.0 - 10.5 K/uL 8.7 8.3 13.6(H)  Hemoglobin 12.0 - 15.0 g/dL 9.0(L) 9.3(L) 9.0(L)  Hematocrit 36.0 - 46.0 % 29.0(L) 30.8(L) 29.2(L)  Platelets 150 - 400 K/uL 325 345 304  BMET  Recent Labs  05/10/14 0320 05/12/14 0245  NA 135 137  K 3.4* 3.3*  CL 105 106  CO2 19 25  GLUCOSE 101* 110*  BUN 9 <5*  CREATININE 0.51 0.31*  CALCIUM 8.0* 8.0*     Liver Panel   Recent Labs  05/10/14 0320  PROT 6.4  ALBUMIN 2.2*  AST 17  ALT 16  ALKPHOS 149*  BILITOT 0.7       Sedimentation Rate No results for input(s): ESRSEDRATE in the last 72 hours. C-Reactive Protein No results for input(s): CRP in the last 72 hours.  Micro Results: Recent Results (from the past 720 hour(s))  Culture, blood (routine x 2)     Status: None (Preliminary result)   Collection Time: 05/07/14  3:30 PM  Result Value  Ref Range Status   Specimen Description BLOOD LEFT ANTECUBITAL  Final   Special Requests BOTTLES DRAWN AEROBIC AND ANAEROBIC 6CC  Final   Culture NO GROWTH 4 DAYS  Final   Report Status PENDING  Incomplete  Culture, blood (routine x 2)     Status: None (Preliminary result)   Collection Time: 05/07/14  3:45 PM  Result Value Ref Range Status   Specimen Description BLOOD LEFT ARM DRAWN BY RN DMA  Final   Special Requests BOTTLES DRAWN AEROBIC AND ANAEROBIC 6CC  Final   Culture NO GROWTH 4 DAYS  Final   Report Status PENDING  Incomplete  Urine culture     Status: None   Collection Time: 05/07/14  4:38 PM  Result Value Ref Range Status   Specimen Description URINE, CATHETERIZED  Final   Special Requests NONE  Final   Colony Count   Final    70,000 COLONIES/ML Performed at [facility]    Culture   Final    Multiple bacterial morphotypes present, none predominant. Suggest appropriate recollection if clinically indicated. Performed at [facility]    Report Status 05/09/2014 FINAL  Final  Clostridium Difficile by PCR     Status: Abnormal   Collection Time: 05/07/14  9:30 PM  Result Value Ref Range Status   C difficile by pcr POSITIVE (A) NEGATIVE Final    Comment: RESULT CALLED TO, READ BACK BY AND VERIFIED WITH: KEITH A AT 0154 ON [id number] BY FORSYTH K   MRSA PCR Screening     Status: None   Collection Time: 05/07/14  9:30 PM  Result Value Ref Range Status   MRSA by PCR NEGATIVE NEGATIVE Final    Comment:        The GeneXpert MRSA Assay (FDA approved for NASAL specimens only), is one component of a comprehensive MRSA colonization surveillance program. It is not intended to diagnose MRSA infection nor to guide or monitor treatment for MRSA infections.     Studies/Results: No results found.    Assessment/Plan:  Active Problems:   Paraplegic spinal paralysis   Thyroid disease   Sepsis   Diarrhea   Hypothyroid   ARF (acute renal failure)   Enteritis  due to Clostridium difficile   UTI (urinary tract infection)   Chronic indwelling Foley catheter   Osteomyelitis   Discitis of thoracolumbar region   Anxiety state   Chronic pain syndrome   Sepsis due to Candida species   Spondylarthrosis   Acute renal failure syndrome   Other specified hypothyroidism   Essential hypertension   Paraplegia   Abscess in epidural space of lumbar spine   Blood poisoning    [name]  is a 69 y.o. female with with paraplegia and new lumbar discitis osteomyelitis and abscess, C. difficile PCR positive stool.   #1 Lumbar discitis osteomyelitis abscess: I had DC her IV antibiotics in hope that IR could get aspirate for cultures but they are uncomfortable doing so because part of the patient's abscess doesn't ball their cord.  There is a reconsult going back to neurosurgery. As mentioned in my initial note Dr.Stern felt no indication for Neurosurgery.  Whatever the means IF IT IS FEASABLE and SAFE I WOULD LIKE TO HAVE AN ASPIRATE FROM HER ABSCESS , DISC SPACE FOR BACTERIAL CULTURES SO THAT I CAN GIVE HER TARGETTED IV ANTIBIOTICS pRESSLEY GIVEN HER KNOWN PRIOR c. DIFFICILE INFECTION AND c. DIFFICILE COLONIZATION PLUS MINUS INFECTION.   #2 C. difficile positive stool:  Her clinical syndrome did not fit with C. difficile colitis when she was first checked (after manual disimpaction) She is having more loose stools now so I may pull trigger on treating her for CDI esp if we are starting other abx  Therefore I would not start her on oral vancomycin but I would keep her on enteric precautions.   #3 Screening:  HIV and hep C negative  Dr. Megan Salon pick up the service tomorrow.     LOS: 5 days   Alcide Evener 05/12/2014, 1:45 PM

## 2014-05-12 NOTE — Progress Notes (Signed)
Patient ID: [name], female   DOB: 18-Mar-1946, 69 y.o.   MRN: [id number]    Request made for T4-11 and or L2-3 epidural abscess aspiration/drain Dr Laurence Ferrari has reviewed imaging  Feels best to have Neuro surgery evaluate and maybe take pt to surgery. Abscess is involving spinal cord  Discussed with Dr Thereasa Solo

## 2014-05-12 NOTE — Progress Notes (Signed)
[facility] TEAM 1 - Stepdown/ICU TEAM Progress Note  DALICIA KISNER UYQ:[id number] DOB: 1945/08/04 DOA: 05/07/2014 PCP: [name], MD  Admit HPI / Brief Narrative: 69 year old F Hx Bipolar disorder, anxiety, chronic pain syndrome, Paraplegic spinal paralysis; Hypertension; Thyroid disease; Diabetes mellitus without complication; Obesity; COPD; and GERD who was brought from [place] home after she complained of jerking tremors to both upper extremities for 3 days.   In the ED patient was found to be hypotensive with blood pressure 82/52, found to have an abnormal UA, and complained of auditory hallucinations. Patient started on vancomycin and Zosyn for sepsis due to UTI. Lactic acid was normal with 1.55 and repeat lactic acid 0.76. Patient did have high white count of 22.  Abdominal x-ray showed advanced destructive changes in the L1-L2 level, with recommendation for MRI for possible osteomyellitus/discitis.  Patient has chronic Foley catheter, she was diagnosed with UTI recently on January 27.  HPI/Subjective: Pt has no new complaints today.  She is quite talkative, but denies any new problems.   Assessment/Plan:  Lumabar dicitis v/s osteomyelitis on Xray -X-ray of the abdomen suggested destructive changes of L1-L2 -MRI of the lumbar spine noted possible abscess/osteitis/discitis -Per ID DC all antibiotics and obtain fluid from hematoma on Monday or Tuesday by IR -IR has reviewed the films and states the area is not amenable to percutaneous sampling/aspiration -ID has asked NS to re-evaluate for sampling    C diff positive stool -per ID   Bipolar disorder -Continue Seroquel 75 mg  QHS   Anxiety -Continue Lorazepam 0.5 mg q 4 hr PRN   Chronic pain syndrome (primary team should be the only ones to adjust pain medication) -Oxycodone 5 mg q 6 hr PRN pain (home regimen as prescribed) -Neurontin 500 mg TID -Continue Zanaflex 2 mg q 6hr PRN  Drug seeking behavior -Pt made no  attempts today to have her med regimen adjusted   Sepsis unspecified organism -Sepsis physiology resolved -Continue to monitor   Acute kidney injury -baseline creatinine is 0.71 -creatinine now within normal limits  Hypothyroidism -TSH within normal limits -Continue Synthroid 225 g daily  HTN -Controlled without BP medication  Tremors -Most likely secondary to patient's extremely low BP - resolved    Paraplegia -Patient has paraplegia w/ chronic Foley catheter in place   Code Status: FULL Family Communication: no family present at time of exam Disposition Plan: SDU   Consultants: ID  Procedure/Significant Events: 05/14/2009 MRI T-spine/L-spine with and without contrast;Large extramedullary fluid collection in the thoracic spine from T4- T11. This is compressing the cord. -marked cord atrophy especially at T7-8 and below. Could be related to chronic hematoma as some of the signal characteristics suggests layering blood.  -non cystic mass within the spinal canal compressing the cord at the T7 level could be hematoma.  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 -Extensive enhancement large fluid collection between L2-L3 vertebral bodies extending into the facet joints bilaterally, concerning for osteomyelitis and abscess,   Antibiotics: Zosyn 2/10 > 2/12 Vancomycin 2/10 > 2/12  DVT prophylaxis: Discontinued Lovenox 2/14 for procedure on Monday or Tuesday - resume tonight until plans finalized  SCDs  Objective: Blood pressure 125/78, pulse 97, temperature 98 F (36.7 C), temperature source Oral, resp. rate 32, height 5\' 7"  (1.702 m), weight 108.1 kg (238 lb 5.1 oz), SpO2 97 %.  Intake/Output Summary (Last 24 hours) at 05/12/14 1128 Last data filed at 05/12/14 0700  Gross per 24 hour  Intake 1374.83 ml  Output   3175 ml  Net -1800.17 ml   Exam: General: No acute respiratory distress Lungs: Clear to auscultation bilaterally without wheezes or  crackles Cardiovascular: Regular rate and rhythm without murmur gallop or rub  Abdomen: Morbidly obese, nontender, nondistended, soft, bowel sounds positive, no rebound, no ascites, no appreciable mass Extremities: No significant cyanosis, 2+ bilateral lower extremities edema     Data Reviewed: Basic Metabolic Panel:  Recent Labs Lab 05/07/14 1330 05/07/14 1559 05/08/14 0505 05/09/14 0900 05/10/14 0320 05/12/14 0245  NA 130* 134* 140 140 135 137  K 4.5 4.5 4.3 3.9 3.4* 3.3*  CL 100 98 107 110 105 106  CO2 20  --  22 17* 19 25  GLUCOSE 80 82 85 72 101* 110*  BUN 64* 61* 51* 19 9 <5*  CREATININE 1.32* 1.50* 0.89 0.71 0.51 0.31*  CALCIUM 8.7  --  8.5 8.8 8.0* 8.0*  MG  --   --   --  1.8 1.4*  --    Liver Function Tests:  Recent Labs Lab 05/07/14 1330 05/08/14 0505 05/09/14 0900 05/10/14 0320  AST 18 14 16 17   ALT 27 22 17 16   ALKPHOS 211* 183* 175* 149*  BILITOT 0.8 0.7 1.0 0.7  PROT 7.0 6.6 6.7 6.4  ALBUMIN 2.6* 2.5* 2.4* 2.2*   CBC:  Recent Labs Lab 05/07/14 1330 05/07/14 1559 05/08/14 0505 05/09/14 0900 05/10/14 0320  WBC 22.1*  --  13.6* 8.3 8.7  NEUTROABS 17.8*  --   --   --  5.7  HGB 9.0* 10.2* 9.0* 9.3* 9.0*  HCT 28.6* 30.0* 29.2* 30.8* 29.0*  MCV 85.6  --  86.6 87.3 86.6  PLT 331  --  304 345 325   Cardiac Enzymes:  Recent Labs Lab 05/07/14 1330  TROPONINI <0.03   BNP (last 3 results)  Recent Labs  03/29/14 0429  BNP 73.0    Recent Results (from the past 240 hour(s))  Culture, blood (routine x 2)     Status: None (Preliminary result)   Collection Time: 05/07/14  3:30 PM  Result Value Ref Range Status   Specimen Description BLOOD LEFT ANTECUBITAL  Final   Special Requests BOTTLES DRAWN AEROBIC AND ANAEROBIC 6CC  Final   Culture NO GROWTH 4 DAYS  Final   Report Status PENDING  Incomplete  Culture, blood (routine x 2)     Status: None (Preliminary result)   Collection Time: 05/07/14  3:45 PM  Result Value Ref Range Status    Specimen Description BLOOD LEFT ARM DRAWN BY RN DMA  Final   Special Requests BOTTLES DRAWN AEROBIC AND ANAEROBIC 6CC  Final   Culture NO GROWTH 4 DAYS  Final   Report Status PENDING  Incomplete  Urine culture     Status: None   Collection Time: 05/07/14  4:38 PM  Result Value Ref Range Status   Specimen Description URINE, CATHETERIZED  Final   Special Requests NONE  Final   Colony Count   Final    70,000 COLONIES/ML Performed at [facility]    Culture   Final    Multiple bacterial morphotypes present, none predominant. Suggest appropriate recollection if clinically indicated. Performed at [facility]    Report Status 05/09/2014 FINAL  Final  Clostridium Difficile by PCR     Status: Abnormal   Collection Time: 05/07/14  9:30 PM  Result Value Ref Range Status   C difficile by pcr POSITIVE (A) NEGATIVE Final    Comment: RESULT  CALLED TO, READ BACK BY AND VERIFIED WITH: KEITH A AT 0154 ON [id number] BY FORSYTH K   MRSA PCR Screening     Status: None   Collection Time: 05/07/14  9:30 PM  Result Value Ref Range Status   MRSA by PCR NEGATIVE NEGATIVE Final    Comment:        The GeneXpert MRSA Assay (FDA approved for NASAL specimens only), is one component of a comprehensive MRSA colonization surveillance program. It is not intended to diagnose MRSA infection nor to guide or monitor treatment for MRSA infections.      Studies:  Recent x-ray studies have been reviewed in detail by the Attending Physician  Scheduled Meds:  Scheduled Meds: . antiseptic oral rinse  7 mL Mouth Rinse BID  . aspirin EC  81 mg Oral Daily  . baclofen  10 mg Oral BID  . buPROPion  150 mg Oral Daily  . ezetimibe  10 mg Oral Daily  . gabapentin  500 mg Oral TID  . Gerhardt's butt cream   Topical Daily  . levothyroxine  200 mcg Oral QAC breakfast  . levothyroxine  25 mcg Oral QAC breakfast  . LORazepam  1 mg Intravenous Once  . QUEtiapine  75 mg Oral QHS    Time spent on care  of this patient: 35 mins  Cherene Altes, MD [facility] For Consults/Admissions - Flow Manager - [phone] Office  [phone]  Contact MD directly via text page:      [email or web address]      password [facility]  05/12/2014, 11:28 AM   LOS: 5 days

## 2014-05-13 DIAGNOSIS — G062 Extradural and subdural abscess, unspecified: Secondary | ICD-10-CM

## 2014-05-13 DIAGNOSIS — M4645 Discitis, unspecified, thoracolumbar region: Secondary | ICD-10-CM

## 2014-05-13 LAB — COMPREHENSIVE METABOLIC PANEL
ALT: 13 U/L (ref 0–35)
AST: 18 U/L (ref 0–37)
Albumin: 2.3 g/dL — ABNORMAL LOW (ref 3.5–5.2)
Alkaline Phosphatase: 99 U/L (ref 39–117)
Anion gap: 6 (ref 5–15)
BILIRUBIN TOTAL: 0.4 mg/dL (ref 0.3–1.2)
BUN: 5 mg/dL — ABNORMAL LOW (ref 6–23)
CHLORIDE: 105 mmol/L (ref 96–112)
CO2: 26 mmol/L (ref 19–32)
Calcium: 8.1 mg/dL — ABNORMAL LOW (ref 8.4–10.5)
Creatinine, Ser: 0.46 mg/dL — ABNORMAL LOW (ref 0.50–1.10)
GLUCOSE: 104 mg/dL — AB (ref 70–99)
POTASSIUM: 3.8 mmol/L (ref 3.5–5.1)
SODIUM: 137 mmol/L (ref 135–145)
Total Protein: 6 g/dL (ref 6.0–8.3)

## 2014-05-13 LAB — CBC
HCT: 29.7 % — ABNORMAL LOW (ref 36.0–46.0)
Hemoglobin: 9.3 g/dL — ABNORMAL LOW (ref 12.0–15.0)
MCH: 26.1 pg (ref 26.0–34.0)
MCHC: 31.3 g/dL (ref 30.0–36.0)
MCV: 83.4 fL (ref 78.0–100.0)
PLATELETS: 268 10*3/uL (ref 150–400)
RBC: 3.56 MIL/uL — AB (ref 3.87–5.11)
RDW: 16.4 % — ABNORMAL HIGH (ref 11.5–15.5)
WBC: 8.4 10*3/uL (ref 4.0–10.5)

## 2014-05-13 LAB — MAGNESIUM: Magnesium: 1.6 mg/dL (ref 1.5–2.5)

## 2014-05-13 MED ORDER — VANCOMYCIN 50 MG/ML ORAL SOLUTION
125.0000 mg | Freq: Four times a day (QID) | ORAL | Status: DC
  Administered 2014-05-13 – 2014-05-20 (×30): 125 mg via ORAL
  Filled 2014-05-13 (×33): qty 2.5

## 2014-05-13 NOTE — Progress Notes (Signed)
Patient ID: [name], female   DOB: 1946/01/01, 69 y.o.   MRN: [id number]         [facility] for Infectious Disease    Date of Admission:  05/07/2014   off antibiotics 4 days  Active Problems:   Paraplegic spinal paralysis   Thyroid disease   Sepsis   Diarrhea   Hypothyroid   ARF (acute renal failure)   Enteritis due to Clostridium difficile   UTI (urinary tract infection)   Chronic indwelling Foley catheter   Osteomyelitis   Discitis of thoracolumbar region   Anxiety state   Chronic pain syndrome   Sepsis due to Candida species   Spondylarthrosis   Acute renal failure syndrome   Other specified hypothyroidism   Essential hypertension   Paraplegia   Abscess in epidural space of lumbar spine   Blood poisoning   . aspirin EC  81 mg Oral Daily  . baclofen  10 mg Oral BID  . buPROPion  150 mg Oral Daily  . enoxaparin (LOVENOX) injection  50 mg Subcutaneous Q24H  . ezetimibe  10 mg Oral Daily  . gabapentin  400 mg Oral TID  . Gerhardt's butt cream   Topical Daily  . levothyroxine  200 mcg Oral QAC breakfast  . levothyroxine  25 mcg Oral QAC breakfast  . QUEtiapine  75 mg Oral QHS    Subjective: Ms. 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 She underwent surgery in 2012. She also has a history of recurrent C. difficile colitis. She was treated for PCR positive colitis last July and treated presumptively again last December. She was hospitalized again recently with hallucinations and hypotension. Admission blood cultures are negative. She was found to have progressive destruction at L2-3 associated with a large paraspinous fluid compatible with osteomyelitis and abscess. She states that she waxes and wanes between constipation and diarrhea. She was disimpacted on admission. She is now having more loose stools than usual.  Review of Systems: Pertinent items are noted in HPI.  Past Medical  History  Diagnosis Date  . Paraplegic spinal paralysis   . Hypertension   . Bipolar 1 disorder   . Thyroid disease   . Diabetes mellitus without complication   . Obesity   . Pyelonephritis   . COPD with asthma   . GERD (gastroesophageal reflux disease)     History  Substance Use Topics  . Smoking status: Former Research scientist (life sciences)  . Smokeless tobacco: Not on file  . Alcohol Use: No    Family History  Problem Relation Age of Onset  . Heart disease Mother    Allergies  Allergen Reactions  . Codeine   . Propranolol Hcl     OBJECTIVE: Blood pressure 140/79, pulse 86, temperature 97.8 F (36.6 C), temperature source Oral, resp. rate 27, height 5\' 7"  (1.702 m), weight 238 lb 5.1 oz (108.1 kg), SpO2 95 %. General: Alert and talkative. She is in no distress Skin: No rash Lungs: Clear Cor: Regular S1 and S2 with no murmur Abdomen: Soft with quiet bowel sounds  Lab Results Lab Results  Component Value Date   WBC 8.4 05/13/2014   HGB 9.3* 05/13/2014   HCT 29.7* 05/13/2014   MCV 83.4 05/13/2014   PLT 268 05/13/2014    Lab Results  Component Value Date   CREATININE 0.46* 05/13/2014   BUN 5* 05/13/2014   NA 137 05/13/2014   K 3.8 05/13/2014   CL 105 05/13/2014  CO2 26 05/13/2014    Lab Results  Component Value Date   ALT 13 05/13/2014   AST 18 05/13/2014   ALKPHOS 99 05/13/2014   BILITOT 0.4 05/13/2014     Microbiology: Recent Results (from the past 240 hour(s))  Culture, blood (routine x 2)     Status: None (Preliminary result)   Collection Time: 05/07/14  3:30 PM  Result Value Ref Range Status   Specimen Description BLOOD LEFT ANTECUBITAL  Final   Special Requests BOTTLES DRAWN AEROBIC AND ANAEROBIC 6CC  Final   Culture NO GROWTH 4 DAYS  Final   Report Status PENDING  Incomplete  Culture, blood (routine x 2)     Status: None (Preliminary result)   Collection Time: 05/07/14  3:45 PM  Result Value Ref Range Status   Specimen Description BLOOD LEFT ARM DRAWN BY RN  DMA  Final   Special Requests BOTTLES DRAWN AEROBIC AND ANAEROBIC 6CC  Final   Culture NO GROWTH 4 DAYS  Final   Report Status PENDING  Incomplete  Urine culture     Status: None   Collection Time: 05/07/14  4:38 PM  Result Value Ref Range Status   Specimen Description URINE, CATHETERIZED  Final   Special Requests NONE  Final   Colony Count   Final    70,000 COLONIES/ML Performed at [facility]    Culture   Final    Multiple bacterial morphotypes present, none predominant. Suggest appropriate recollection if clinically indicated. Performed at [facility]    Report Status 05/09/2014 FINAL  Final  Clostridium Difficile by PCR     Status: Abnormal   Collection Time: 05/07/14  9:30 PM  Result Value Ref Range Status   C difficile by pcr POSITIVE (A) NEGATIVE Final    Comment: RESULT CALLED TO, READ BACK BY AND VERIFIED WITH: KEITH A AT 0154 ON [id number] BY FORSYTH K   MRSA PCR Screening     Status: None   Collection Time: 05/07/14  9:30 PM  Result Value Ref Range Status   MRSA by PCR NEGATIVE NEGATIVE Final    Comment:        The GeneXpert MRSA Assay (FDA approved for NASAL specimens only), is one component of a comprehensive MRSA colonization surveillance program. It is not intended to diagnose MRSA infection nor to guide or monitor treatment for MRSA infections.    MRI THORACIC AND LUMBAR SPINE WITHOUT AND WITH CONTRAST 05/08/2014  IMPRESSION: Image quality is significantly degraded by motion in the thoracic and lumbar spine.  Large extramedullary fluid collection in the thoracic spine from T4 through T11. This is compressing the cord. There is marked cord atrophy especially at T7-8 and below. This fluid collection could be related to chronic hematoma as some of the signal characteristics suggests layering blood. There is also a non cystic mass within the spinal canal compressing the cord at the T7 level which could be hematoma. Calcified disc or  epidural mass are other considerations.  The marked destructive process at L2-3 with severe spinal stenosis. There is extensive enhancement of a large fluid collection between the L2 and L3 vertebral bodies extending into the facet joints bilaterally, concerning for osteomyelitis and abscess, particularly given the patient's clinical findings.  Electronically Signed: By: Franchot Gallo M.D. On: 05/08/2014 19:46   Assessment: I suspect the primary source of her presenting sepsis is severe vertebral infection. I agree with my partner, Dr. Tommy Medal, that it is clearly preferable to obtain fluid for  stains and cultures to help guide antibiotic therapy. Empiric therapy would required a broad antibiotic regimen that may not be optimally effective and would almost certainly complicate her C. difficile infection/colonization. Given that her loose stools are now more frequent than what she considers normal I will start oral vancomycin. I feel she should be back on contact precautions.  Plan: 1. Contact/enteric precautions 2. Start oral vancomycin 3. Recommend paraspinal fluid sampling by interventional radiology or neurosurgery before making a decision about systemic antibiotic therapy  Michel Bickers, MD [facility] for [place] [phone] pager   [phone] cell 05/13/2014, 10:03 AM

## 2014-05-13 NOTE — Clinical Documentation Improvement (Signed)
    PLEASE NOTE IF PRESENT ON ADMISSION  MD's, NP's, and PA's    Per nursing notes patient has a stage II pressure ulcer to lower buttocks and a stage I to left heel. If these clinical conditions are appropriate to this admission, please document in notes and d/c summary.  Thank you    Possible Clinical Conditions?   Stage  I  Pressure Ulcer   (reddening of the skin) Stage  II Pressure Ulcer  (blister open or unopened) Stage  III Pressure Ulcer (through all layers skin) Stage IV Pressure Ulcer   (through skin & underlying  muscle, tendons, and bones) Other Condition Cannot Clinically Determine   Treatment: Skin Care protocal  Thank You, Ree Kida ,RN Clinical Documentation Specialist:  [phone]  [facility] Information Management

## 2014-05-13 NOTE — Progress Notes (Signed)
INITIAL NUTRITION ASSESSMENT  DOCUMENTATION CODES Per approved criteria  -Not Applicable   INTERVENTION:  Snacks TID between meals to maximize protein intake.  NUTRITION DIAGNOSIS: Increased nutrient needs related to multiple wounds as evidenced by estimated protein and calorie needs.   Goal: Intake to meet >90% of estimated nutrition needs.  Monitor:  PO intake, labs, weight trend.  Reason for Assessment: Low [place]  69 y.o. female  Admitting Dx: Tremors  ASSESSMENT: Patient admitted from nursing facility on 2/10 with complaints of jerking tremors to upper extremities for 3 days. History of paraplegic spinal paralysis.    Patient reports that she usually eats [place] Yogurt TID between meals to increase protein intake. She eats very well and likes to eat. Discussed the importance of adequate protein intake to promote wound healing. Reviewed good sources of dietary protein. Patient agreed to high protein snacks TID between meals to maximize protein intake.   Height: Ht Readings from Last 1 Encounters:  05/07/14 5\' 7"  (1.702 m)    Weight: Wt Readings from Last 1 Encounters:  05/08/14 238 lb 5.1 oz (108.1 kg)    Ideal Body Weight: 61.4 kg  % Ideal Body Weight: 176%  Wt Readings from Last 10 Encounters:  05/08/14 238 lb 5.1 oz (108.1 kg)  03/29/14 346 lb (156.945 kg)  10/22/13 346 lb (156.945 kg)  10/11/12 346 lb (156.945 kg)  05/16/07 246 lb (111.585 kg)    Usual Body Weight: unknown  % Usual Body Weight: N/A  BMI:  Body mass index is 37.32 kg/(m^2).  Estimated Nutritional Needs: Kcal: 1800-2000 Protein: 120-135 gm Fluid: >/= 2 L  Skin: stage 1 pressure ulcer to heel, stage 2 pressure ulcer to buttocks  Diet Order: Diet regular  EDUCATION NEEDS: -Education needs addressed   Intake/Output Summary (Last 24 hours) at 05/13/14 1419 Last data filed at 05/13/14 1120  Gross per 24 hour  Intake    630 ml  Output   2401 ml  Net  -1771 ml    Last BM:  2/14   Labs:   Recent Labs Lab 05/09/14 0900 05/10/14 0320 05/12/14 0245 05/13/14 0305  NA 140 135 137 137  K 3.9 3.4* 3.3* 3.8  CL 110 105 106 105  CO2 17* 19 25 26   BUN 19 9 <5* 5*  CREATININE 0.71 0.51 0.31* 0.46*  CALCIUM 8.8 8.0* 8.0* 8.1*  MG 1.8 1.4*  --  1.6  GLUCOSE 72 101* 110* 104*    CBG (last 3)  No results for input(s): GLUCAP in the last 72 hours.  Scheduled Meds: . aspirin EC  81 mg Oral Daily  . baclofen  10 mg Oral BID  . buPROPion  150 mg Oral Daily  . enoxaparin (LOVENOX) injection  50 mg Subcutaneous Q24H  . ezetimibe  10 mg Oral Daily  . gabapentin  400 mg Oral TID  . Gerhardt's butt cream   Topical Daily  . levothyroxine  200 mcg Oral QAC breakfast  . levothyroxine  25 mcg Oral QAC breakfast  . QUEtiapine  75 mg Oral QHS  . vancomycin  125 mg Oral 4 times per day    Continuous Infusions: . sodium chloride 10 mL/hr at 05/12/14 2000    Past Medical History  Diagnosis Date  . Paraplegic spinal paralysis   . Hypertension   . Bipolar 1 disorder   . Thyroid disease   . Diabetes mellitus without complication   . Obesity   . Pyelonephritis   . COPD with asthma   .  GERD (gastroesophageal reflux disease)     Past Surgical History  Procedure Laterality Date  . Back surgery    . Abdominal hysterectomy      Molli Barrows, [place], LDN, [place] Pager [phone] After Hours Pager [phone]

## 2014-05-13 NOTE — Consult Note (Signed)
CC:  Chief Complaint  Patient presents with  . Tremors    HPI: [name] is a 69 y.o. female seen at the request of Dr. Sherral Hammers. The patient is admitted over the last 6 days initially presenting with tremors, and found to be hypotensive with sepsis. 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 Abdominal x-ray was done during hospitalization which demonstrated destructive changes at the upper lumbar levels and therefore MRI of the thoracic and lumbar spine was recently done. The stem inserted to the possibility of spinal osteomyelitis/discitis especially at the L2-3 level.  PMH: Past Medical History  Diagnosis Date  . Paraplegic spinal paralysis   . Hypertension   . Bipolar 1 disorder   . Thyroid disease   . Diabetes mellitus without complication   . Obesity   . Pyelonephritis   . COPD with asthma   . GERD (gastroesophageal reflux disease)     PSH: Past Surgical History  Procedure Laterality Date  . Back surgery    . Abdominal hysterectomy      SH: History  Substance Use Topics  . Smoking status: Former Research scientist (life sciences)  . Smokeless tobacco: Not on file  . Alcohol Use: No    MEDS: Prior to Admission medications   Medication Sig Start Date End Date Taking? Authorizing Provider  acetaminophen (TYLENOL) 500 MG tablet Take 500 mg by mouth every 6 (six) hours as needed for pain.   Yes Historical Provider, MD  acidophilus (RISAQUAD) CAPS capsule Take 1 capsule by mouth daily.   Yes Historical Provider, MD  aspirin EC 81 MG tablet Take 81 mg by mouth daily.   Yes Historical Provider, MD  b complex vitamins tablet Take 1 tablet by mouth daily.   Yes Historical Provider, MD  baclofen (LIORESAL) 10 MG tablet Take 10 mg by mouth 2 (two) times daily.    Yes Historical Provider, MD  buPROPion (WELLBUTRIN XL) 150 MG 24 hr tablet Take 150 mg by mouth daily.   Yes Historical Provider, MD  calcium citrate-vitamin  D 500-400 MG-UNIT Chew 1 tablet by mouth 2 (two) times daily.   Yes Historical Provider, MD  carvedilol (COREG) 12.5 MG tablet Take 12.5 mg by mouth 2 (two) times daily with a meal.   Yes Historical Provider, MD  cetirizine (ZYRTEC) 10 MG tablet Take 10 mg by mouth daily.   Yes Historical Provider, MD  colchicine 0.6 MG tablet Take 0.6 mg by mouth 2 (two) times daily.   Yes Historical Provider, MD  Cranberry 475 MG CAPS Take 1 capsule by mouth daily.   Yes Historical Provider, MD  dextromethorphan-guaiFENesin (MUCINEX DM) 30-600 MG per 12 hr tablet Take 1 tablet by mouth 2 (two) times daily as needed for cough.   Yes Historical Provider, MD  diphenhydrAMINE (BENADRYL) 12.5 MG chewable tablet Chew 12.5 mg by mouth 4 (four) times daily as needed for allergies.   Yes Historical Provider, MD  ezetimibe (ZETIA) 10 MG tablet Take 10 mg by mouth daily.   Yes Historical Provider, MD  fentaNYL (DURAGESIC - DOSED MCG/HR) 25 MCG/HR patch Place 1 patch (25 mcg total) onto the skin every 3 (three) days. 04/01/14  Yes Annita Brod, MD  furosemide (LASIX) 40 MG tablet Take 40 mg by mouth daily.   Yes Historical Provider, MD  gabapentin (NEURONTIN) 300 MG capsule Take 300 mg by mouth 3 (three) times daily.   Yes Historical Provider, MD  hydroxypropyl methylcellulose / hypromellose (ISOPTO TEARS / GONIOVISC) 2.5 % ophthalmic solution Place 1 drop into both eyes as needed for dry eyes.   Yes Historical Provider, MD  iron polysaccharides (POLY-IRON 150) 150 MG capsule Take 150 mg by mouth daily.   Yes Historical Provider, MD  levothyroxine (SYNTHROID, LEVOTHROID) 200 MCG tablet Take 200 mcg by mouth daily. Takes with Synthroid (Levothyroxine) 25 mcg for a total of 225mcg daily at 6:00am   Yes Historical Provider, MD  levothyroxine (SYNTHROID, LEVOTHROID) 25 MCG tablet Take 25 mcg by mouth daily. Takes with Synthroid (Levothyroxine) 267mcg for a total of 269mcg daily at 6:00am   Yes Historical Provider, MD  lisinopril  (PRINIVIL,ZESTRIL) 5 MG tablet Take 5 mg by mouth daily.   Yes Historical Provider, MD  montelukast (SINGULAIR) 10 MG tablet Take 10 mg by mouth daily.   Yes Historical Provider, MD  Omega-3 Fatty Acids (SEA-OMEGA PO) Take 1 capsule by mouth 2 (two) times daily.   Yes Historical Provider, MD  QUEtiapine (SEROQUEL) 50 MG tablet Take 50 mg by mouth at bedtime.   Yes Historical Provider, MD  senna-docusate (SENOKOT-S) 8.6-50 MG per tablet Take 1 tablet by mouth at bedtime.   Yes Historical Provider, MD  vitamin C (ASCORBIC ACID) 500 MG tablet Take 500 mg by mouth 2 (two) times daily.   Yes Historical Provider, MD  albuterol (PROVENTIL HFA) 108 (90 BASE) MCG/ACT inhaler Inhale 2 puffs into the lungs every 8 (eight) hours as needed (Asthma).     Historical Provider, MD  alendronate (FOSAMAX) 70 MG tablet Take 70 mg by mouth once a week. Take with a full glass of water on an empty stomach.    Historical Provider, MD  alum & mag hydroxide-simeth (MAALOX/MYLANTA) 200-200-20 MG/5ML suspension Take 30 mLs by mouth every 4 (four) hours as needed for indigestion or heartburn.    Historical Provider, MD  Amino Acids-Protein Hydrolys (FEEDING SUPPLEMENT, PRO-STAT SUGAR FREE 64,) LIQD Take 30 mLs by mouth 3 (three) times daily with meals. Patient not taking: Reported on 05/07/2014 04/01/14   Annita Brod, MD  ipratropium-albuterol (DUONEB) 0.5-2.5 (3) MG/3ML SOLN Take 3 mLs by nebulization every 6 (six) hours as needed (shortness of breath).     Historical Provider, MD  LORazepam (ATIVAN) 0.5 MG tablet Take 0.5 tablets (0.25 mg total) by mouth every 6 (six) hours as needed for anxiety. 04/01/14   Annita Brod, MD  oxycodone (OXY-IR) 5 MG capsule Take 1 capsule (5 mg total) by mouth every 6 (six) hours as needed. 04/01/14   Annita Brod, MD  promethazine (PHENERGAN) 12.5 MG tablet Take 12.5 mg by mouth every 6 (six) hours as needed for nausea or vomiting.    Historical Provider, MD  QUEtiapine (SEROQUEL) 25  MG tablet Take 0.5 tablets (12.5 mg total) by mouth at bedtime. Patient not taking: Reported on 05/07/2014 04/01/14   Annita Brod, MD  tiZANidine (ZANAFLEX) 2 MG tablet Take 2 mg by mouth every 6 (six) hours as needed for muscle spasms.    Historical Provider, MD    ALLERGY: Allergies  Allergen Reactions  . Codeine   . Propranolol Hcl     ROS: ROS  NEUROLOGIC EXAM: Awake, alert, oriented Memory and concentration grossly intact Speech fluent, appropriate CN grossly intact Motor exam: Upper Extremities Deltoid Bicep Tricep Grip  Right 5/5 5/5 5/5 5/5  Left 5/5 5/5 5/5 5/5   Lower Extremity IP Quad PF DF EHL  Right 0/5 -->  Left 0/5 -->       Long-term Foley catheter in place  [facility]: MRI of the thoracic and lumbar spine was reviewed. 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 In the lumbar spine, there is distraction of the inferior portion of L2, L2-L3 disc space, and the superior portion of L3. There is some enhancement at this area. This is superimposed on fairly advanced degenerative lumbar disease causing significant L2-3 stenosis.  IMPRESSION: - 69 y.o. 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  PLAN: - Proceed with percutaneous biopsy of the L2-3 lesion  I did discuss the MRI findings with the patient. Given her lack of neurologic function in the lower extremities, I did tell her that she did not require any type of decompressive surgical procedure for her thoracic or lumbar spine. I therefore told her that it would be my recommendation to proceed with biopsy in interventional radiology. The patient appeared to understand our discussion. All her questions were answered.

## 2014-05-13 NOTE — Progress Notes (Signed)
[place] TEAM 1 - Stepdown/ICU TEAM Progress Note  [name] SHF:[id number] DOB: 30-Apr-1945 DOA: 05/07/2014 PCP: [name], MD  Admit HPI / Brief Narrative: 69 year old WF PMHx ; Bipolar 1 disorder, anxiety, chronic pain syndrome, Paraplegic spinal paralysis; Hypertension; Thyroid disease; Diabetes mellitus without complication; Obesity; Pyelonephritis; COPD with asthma; and GERD (gastroesophageal reflux disease). Patient was brought from [facility] home after she complained of jerking tremors to both upper extremities for past 3 days. In the ED patient was found to be hypotensive with blood pressure 82/52, also patient found to have abnormal UA and complained of auditory hallucinations. Patient started on vancomycin and Zosyn for sepsis due to UTI. Lactic acid was normal with 1.55 and repeat lactic acid 0.76. Patient did have high white count of 22.1. She complains of diarrhea for past few days. Denies nausea or vomiting. Patient is alert and oriented 3, able to answer all the questions she is mentating very well despite the low blood pressure. Abdominal x-ray showed advanced destructive changes in the L1-L2 level, with recommendation for MRI for possible osteoma mellitus/discitis for further evaluation. Patient has chronic Foley catheter, she was diagnosed with UTI recently on January 27.   HPI/Subjective: 2/16 A/O 4, patient sitting comfortably in bed eating dinner however upset that LP did not take place today. Explained to patient that IR had requested neurosurgery first see her and give their okay for procedure.    Assessment/Plan: Bipolar 1 disorder -Continue Seroquel 75 mg  QHS   Anxiety -Continue Lorazepam 0.5 mg q 4 hr PRN   Chronic pain syndrome (primary team should only adjust pain medication) -Description patient's arm pain more consistent with neuropathic pain  -Oxycodone 5 mg q 6 hr PRN Pain (home regimen as prescribed) -Increase Neurontin 500 mg TID -Continue  Zanaflex 2 mg q 6hr PRN  Drug seeking behavior -Patient continues to demand increasing doses of narcotics even though the description of her pain is neuropathic and her gabapentin has been increased over her home dose on admission.  Sepsis unspecified organism -Sepsis physiology resolved -Continue to monitor CBC, CMP, magnesium  Destructive changes at L1-L2 on x-ray -X-ray of the abdomen showed destructive changes of L1-L2,  - MRI of the lumbar spine possible abscess/osteitis/discitis.  -Per ID DC all antibiotics will obtain fluid from hematoma and back on Monday or Tuesday by IR. -Counseled patient that IR had requested neurosurgery see her before they would perform requested LP procedure. -Spoke with neurosurgery today. Dr.Neelesh Nundkumar (neurosurgery) agreed to see patient and it was his recommendation to proceed with biopsy in interventional radiology -Again requested that IR provide LP for fluid analysis per infectious disease instruction  Acute kidney injury(baseline creatinine is 0.71) -Patient Creatinine now within normal limits.  -Patient eating and drinking normally decrease normal saline to KVO   Hypothyroidism -TSH within normal limit -Continue Synthroid 225 g daily  HTN -Controlled without BP medication -Continue normal saline at [place] -Most likely secondary to patient's extremely low BP have resolved   Diarrhea -Positive for C. difficile, colonized per ID   Paraplegia -Patient has paraplegia, has chronic Foley catheter in place; when last change?   Code Status: FULL Family Communication: no family present at time of exam Disposition Plan: Resolution sepsis    Consultants: Dr.Cornelius N [facility] (ID)    Procedure/Significant Events: 05/14/2009 MRI T-spine/L-spine with and without contrast;Large extramedullary fluid collection in the thoracic spine from T4- T11. This is compressing the cord. -marked cord atrophy especially at T7-8  and below.  Could be related to chronic hematoma as some of the signal characteristics suggests layering blood.  -non cystic mass within the spinal canal compressing the cord at the T7 level could be hematoma.  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 -Extensive enhancement large fluid collection between L2-L3 vertebral bodies extending into the facet joints bilaterally, concerning for osteomyelitis and abscess,    Culture 2/10 blood left antecubital/arm NGTD 2/10 urine multiple bacterial morphotypes 2/10 C. difficile positive by PCR, (ID believes colonization)   Antibiotics: Zosyn 2/10>> stopped 2/12 Vancomycin 2/10>> stopped 2/12  DVT prophylaxis: Discontinue Lovenox 2/16 for procedure on Wednesday by IR SCD until procedure is complete and then restart Lovenox  Devices    LINES / TUBES:      Continuous Infusions: . sodium chloride 10 mL/hr at 05/12/14 2000    Objective: VITAL SIGNS: Temp: 97.9 F (36.6 C) (02/16 1639) Temp Source: Axillary (02/16 1639) BP: 137/81 mmHg (02/16 1639) Pulse Rate: 77 (02/16 1118) SPO2; FIO2:   Intake/Output Summary (Last 24 hours) at 05/13/14 1946 Last data filed at 05/13/14 1700  Gross per 24 hour  Intake   1710 ml  Output   3401 ml  Net  -1691 ml     Exam: General: A/O 4, patient somewhat agitated that LP was not performed today, NAD, No acute respiratory distress Lungs: Clear to auscultation bilaterally without wheezes or crackles Cardiovascular: Regular rate and rhythm without murmur gallop or rub normal S1 and S2 Abdomen: Morbidly Obese, Nontender, nondistended, soft, bowel sounds positive, no rebound, no ascites, no appreciable mass Extremities: No significant cyanosis, positive clubbing, positive 3+  bilateral lower extremities edema     Data Reviewed: Basic Metabolic Panel:  Recent Labs Lab 05/08/14 0505 05/09/14 0900 05/10/14 0320 05/12/14 0245 05/13/14 0305  NA 140 140 135 137 137  K 4.3 3.9 3.4*  3.3* 3.8  CL 107 110 105 106 105  CO2 22 17* 19 25 26   GLUCOSE 85 72 101* 110* 104*  BUN 51* 19 9 <5* 5*  CREATININE 0.89 0.71 0.51 0.31* 0.46*  CALCIUM 8.5 8.8 8.0* 8.0* 8.1*  MG  --  1.8 1.4*  --  1.6   Liver Function Tests:  Recent Labs Lab 05/07/14 1330 05/08/14 0505 05/09/14 0900 05/10/14 0320 05/13/14 0305  AST 18 14 16 17 18   ALT 27 22 17 16 13   ALKPHOS 211* 183* 175* 149* 99  BILITOT 0.8 0.7 1.0 0.7 0.4  PROT 7.0 6.6 6.7 6.4 6.0  ALBUMIN 2.6* 2.5* 2.4* 2.2* 2.3*   No results for input(s): LIPASE, AMYLASE in the last 168 hours. No results for input(s): AMMONIA in the last 168 hours. CBC:  Recent Labs Lab 05/07/14 1330 05/07/14 1559 05/08/14 0505 05/09/14 0900 05/10/14 0320 05/13/14 0305  WBC 22.1*  --  13.6* 8.3 8.7 8.4  NEUTROABS 17.8*  --   --   --  5.7  --   HGB 9.0* 10.2* 9.0* 9.3* 9.0* 9.3*  HCT 28.6* 30.0* 29.2* 30.8* 29.0* 29.7*  MCV 85.6  --  86.6 87.3 86.6 83.4  PLT 331  --  304 345 325 268   Cardiac Enzymes:  Recent Labs Lab 05/07/14 1330  TROPONINI <0.03   BNP (last 3 results)  Recent Labs  03/29/14 0429  BNP 73.0    ProBNP (last 3 results) No results for input(s): PROBNP in the last 8760 hours.  CBG: No results for input(s): GLUCAP in the last 168 hours.  Recent Results (from  the past 240 hour(s))  Culture, blood (routine x 2)     Status: None (Preliminary result)   Collection Time: 05/07/14  3:30 PM  Result Value Ref Range Status   Specimen Description BLOOD LEFT ANTECUBITAL  Final   Special Requests BOTTLES DRAWN AEROBIC AND ANAEROBIC 6CC  Final   Culture NO GROWTH 4 DAYS  Final   Report Status PENDING  Incomplete  Culture, blood (routine x 2)     Status: None (Preliminary result)   Collection Time: 05/07/14  3:45 PM  Result Value Ref Range Status   Specimen Description BLOOD LEFT ARM DRAWN BY RN DMA  Final   Special Requests BOTTLES DRAWN AEROBIC AND ANAEROBIC 6CC  Final   Culture NO GROWTH 4 DAYS  Final   Report  Status PENDING  Incomplete  Urine culture     Status: None   Collection Time: 05/07/14  4:38 PM  Result Value Ref Range Status   Specimen Description URINE, CATHETERIZED  Final   Special Requests NONE  Final   Colony Count   Final    70,000 COLONIES/ML Performed at [facility]    Culture   Final    Multiple bacterial morphotypes present, none predominant. Suggest appropriate recollection if clinically indicated. Performed at [facility]    Report Status 05/09/2014 FINAL  Final  Clostridium Difficile by PCR     Status: Abnormal   Collection Time: 05/07/14  9:30 PM  Result Value Ref Range Status   C difficile by pcr POSITIVE (A) NEGATIVE Final    Comment: RESULT CALLED TO, READ BACK BY AND VERIFIED WITH: KEITH A AT 0154 ON [id number] BY FORSYTH K   MRSA PCR Screening     Status: None   Collection Time: 05/07/14  9:30 PM  Result Value Ref Range Status   MRSA by PCR NEGATIVE NEGATIVE Final    Comment:        The GeneXpert MRSA Assay (FDA approved for NASAL specimens only), is one component of a comprehensive MRSA colonization surveillance program. It is not intended to diagnose MRSA infection nor to guide or monitor treatment for MRSA infections.      Studies:  Recent x-ray studies have been reviewed in detail by the Attending Physician  Scheduled Meds:  Scheduled Meds: . aspirin EC  81 mg Oral Daily  . baclofen  10 mg Oral BID  . buPROPion  150 mg Oral Daily  . enoxaparin (LOVENOX) injection  50 mg Subcutaneous Q24H  . ezetimibe  10 mg Oral Daily  . gabapentin  400 mg Oral TID  . Gerhardt's butt cream   Topical Daily  . levothyroxine  200 mcg Oral QAC breakfast  . levothyroxine  25 mcg Oral QAC breakfast  . QUEtiapine  75 mg Oral QHS  . vancomycin  125 mg Oral 4 times per day    Time spent on care of this patient: 40 mins   Allie [facility]  [facility] Office  [phone] Pager - [phone]  On-Call/Text Page:       [email or web address]      password [email or web address]  If 7PM-7AM, please contact night-coverage [email or web address] Password [facility] 05/13/2014, 7:46 PM   LOS: 6 days   Care during the described time interval was provided by me .  I have reviewed this patient's available data, including medical history, events of note, physical examination, radiology studies and test results as part of my evaluation  Dia Crawford, MD [phone] Pager

## 2014-05-14 DIAGNOSIS — M4646 Discitis, unspecified, lumbar region: Secondary | ICD-10-CM | POA: Insufficient documentation

## 2014-05-14 LAB — CULTURE, BLOOD (ROUTINE X 2)
Culture: NO GROWTH
Culture: NO GROWTH

## 2014-05-14 LAB — PROTIME-INR
INR: 1.18 (ref 0.00–1.49)
PROTHROMBIN TIME: 15.1 s (ref 11.6–15.2)

## 2014-05-14 LAB — APTT: aPTT: 38 seconds — ABNORMAL HIGH (ref 24–37)

## 2014-05-14 NOTE — Progress Notes (Signed)
Patient ID: [name], female   DOB: 25-Feb-1946, 69 y.o.   MRN: [id number]         [facility] for Infectious Disease    Date of Admission:  05/07/2014           Day 2 oral vancomycin  Active Problems:   Paraplegic spinal paralysis   Thyroid disease   Sepsis   Diarrhea   Hypothyroid   ARF (acute renal failure)   Enteritis due to Clostridium difficile   UTI (urinary tract infection)   Chronic indwelling Foley catheter   Osteomyelitis   Discitis of thoracolumbar region   Anxiety state   Chronic pain syndrome   Sepsis due to Candida species   Spondylarthrosis   Acute renal failure syndrome   Other specified hypothyroidism   Essential hypertension   Paraplegia   Abscess in epidural space of lumbar spine   Blood poisoning   . aspirin EC  81 mg Oral Daily  . baclofen  10 mg Oral BID  . buPROPion  150 mg Oral Daily  . ezetimibe  10 mg Oral Daily  . gabapentin  400 mg Oral TID  . Gerhardt's butt cream   Topical Daily  . levothyroxine  200 mcg Oral QAC breakfast  . levothyroxine  25 mcg Oral QAC breakfast  . QUEtiapine  75 mg Oral QHS  . vancomycin  125 mg Oral 4 times per day    Subjective: She continues to have some frequent, loose stools. She's irritated about being n.p.o.pending aspiration of her paravertebral abscess.  Review of Systems: Pertinent items are noted in HPI.  Past Medical History  Diagnosis Date  . Paraplegic spinal paralysis   . Hypertension   . Bipolar 1 disorder   . Thyroid disease   . Diabetes mellitus without complication   . Obesity   . Pyelonephritis   . COPD with asthma   . GERD (gastroesophageal reflux disease)     History  Substance Use Topics  . Smoking status: Former Research scientist (life sciences)  . Smokeless tobacco: Not on file  . Alcohol Use: No    Family History  Problem Relation Age of Onset  . Heart disease Mother    Allergies  Allergen Reactions  . Codeine   . Propranolol Hcl     OBJECTIVE: Blood pressure 130/67, pulse  80, temperature 97.8 F (36.6 C), temperature source Oral, resp. rate 19, height 5\' 7"  (1.702 m), weight 238 lb 5.1 oz (108.1 kg), SpO2 97 %. General: irritated but otherwise in no distress  Lab Results Lab Results  Component Value Date   WBC 8.4 05/13/2014   HGB 9.3* 05/13/2014   HCT 29.7* 05/13/2014   MCV 83.4 05/13/2014   PLT 268 05/13/2014    Lab Results  Component Value Date   CREATININE 0.46* 05/13/2014   BUN 5* 05/13/2014   NA 137 05/13/2014   K 3.8 05/13/2014   CL 105 05/13/2014   CO2 26 05/13/2014    Lab Results  Component Value Date   ALT 13 05/13/2014   AST 18 05/13/2014   ALKPHOS 99 05/13/2014   BILITOT 0.4 05/13/2014     Microbiology: Recent Results (from the past 240 hour(s))  Culture, blood (routine x 2)     Status: None   Collection Time: 05/07/14  3:30 PM  Result Value Ref Range Status   Specimen Description BLOOD LEFT ANTECUBITAL  Final   Special Requests BOTTLES DRAWN AEROBIC AND ANAEROBIC 6CC  Final   Culture NO GROWTH 7  DAYS  Final   Report Status 05/14/2014 FINAL  Final  Culture, blood (routine x 2)     Status: None   Collection Time: 05/07/14  3:45 PM  Result Value Ref Range Status   Specimen Description BLOOD LEFT ARM DRAWN BY RN DMA  Final   Special Requests BOTTLES DRAWN AEROBIC AND ANAEROBIC 6CC  Final   Culture NO GROWTH 7 DAYS  Final   Report Status 05/14/2014 FINAL  Final  Urine culture     Status: None   Collection Time: 05/07/14  4:38 PM  Result Value Ref Range Status   Specimen Description URINE, CATHETERIZED  Final   Special Requests NONE  Final   Colony Count   Final    70,000 COLONIES/ML Performed at [facility]    Culture   Final    Multiple bacterial morphotypes present, none predominant. Suggest appropriate recollection if clinically indicated. Performed at [facility]    Report Status 05/09/2014 FINAL  Final  Clostridium Difficile by PCR     Status: Abnormal   Collection Time: 05/07/14  9:30 PM    Result Value Ref Range Status   C difficile by pcr POSITIVE (A) NEGATIVE Final    Comment: RESULT CALLED TO, READ BACK BY AND VERIFIED WITH: KEITH A AT 0154 ON [id number] BY FORSYTH K   MRSA PCR Screening     Status: None   Collection Time: 05/07/14  9:30 PM  Result Value Ref Range Status   MRSA by PCR NEGATIVE NEGATIVE Final    Comment:        The GeneXpert MRSA Assay (FDA approved for NASAL specimens only), is one component of a comprehensive MRSA colonization surveillance program. It is not intended to diagnose MRSA infection nor to guide or monitor treatment for MRSA infections.     Assessment: I will continue oral vancomycin for treatment of mild, recurrent C. Difficile colitis. I will withhold starting empiric IV antibiotics until after her she has had aspiration of her paravertebral abscess.  Plan: 1. Continue oral vancomycin for now  Michel Bickers, MD [facility] for [place] [phone] pager   [phone] cell 05/14/2014, 12:25 PM

## 2014-05-14 NOTE — Plan of Care (Signed)
Problem: Phase I Progression Outcomes Goal: Voiding-avoid urinary catheter unless indicated Outcome: Not Progressing Chronic catheter on admission  Problem: Phase III Progression Outcomes Goal: Voiding independently Outcome: Not Progressing Chronic foley

## 2014-05-14 NOTE — Progress Notes (Signed)
Patient ID: [name], female   DOB: Jan 17, 1946, 69 y.o.   MRN: [id number]    IR procedure has been rescheduled to 2/18 am Awaiting labs Pt aware  New orders in chart

## 2014-05-14 NOTE — Progress Notes (Signed)
Pt. Transferred to 3E off the monitor. Pt. VS stable. Belongings at bedside.

## 2014-05-14 NOTE — Progress Notes (Signed)
Attempted to call report to [facility] x1

## 2014-05-14 NOTE — Progress Notes (Signed)
[facility] [place] TEAM 1 - Stepdown/ICU TEAM Progress Note  [name] WFU:[id number] DOB: July 09, 1945 DOA: 05/07/2014 PCP: [name], MD  Admit HPI / Brief Narrative: 69 year old WF PMHx ; Bipolar 1 disorder, anxiety, chronic pain syndrome, Paraplegic spinal paralysis; Hypertension; Thyroid disease; Diabetes mellitus without complication; Obesity; Pyelonephritis; COPD with asthma; and GERD (gastroesophageal reflux disease). Patient was brought from [place] home after she complained of jerking tremors to both upper extremities for past 3 days. In the ED patient was found to be hypotensive with blood pressure 82/52, also patient found to have abnormal UA and complained of auditory hallucinations. Patient started on vancomycin and Zosyn for sepsis due to UTI. Lactic acid was normal with 1.55 and repeat lactic acid 0.76. Patient did have high white count of 22.1. She complains of diarrhea for past few days. Denies nausea or vomiting. Patient is alert and oriented 3, able to answer all the questions she is mentating very well despite the low blood pressure. Abdominal x-ray showed advanced destructive changes in the L1-L2 level, with recommendation for MRI for possible osteomyleitis/discitis for further evaluation. Patient has chronic Foley catheter, she was diagnosed with UTI recently on January 27.   HPI/Subjective: 2/17. Patient is upset that she is still NPO today for possible IR procedure. She is not having significant diarrhea, shortness of breath or cough.    Assessment/Plan: Bipolar 1 disorder -Continue Seroquel 75 mg  QHS   Anxiety -Continue Lorazepam 0.5 mg q 4 hr PRN   Chronic pain syndrome (primary team should only adjust pain medication) -Description patient's arm pain more consistent with neuropathic pain  -Oxycodone 5 mg q 6 hr PRN Pain (home regimen as prescribed) -Increase Neurontin 500 mg TID -Continue Zanaflex 2 mg q 6hr PRN  Drug seeking behavior -Patient continues  to demand increasing doses of narcotics even though the description of her pain is neuropathic and her gabapentin has been increased over her home dose on admission.  Sepsis unspecified organism -Sepsis physiology resolved -Continue to monitor CBC, CMP, magnesium  Destructive changes at L1-L2 on x-ray -X-ray of the abdomen showed destructive changes of L1-L2,  - MRI of the lumbar spine possible abscess/osteitis/discitis.  -Per ID, DC all antibiotics will obtain fluid from hematoma and back by IR. -Counseled patient that IR had requested neurosurgery see her before they would perform requested LP procedure. -Patient evaluated by Dr.Neelesh Nundkumar (neurosurgery) who agreed to see patient and it was his recommendation to proceed with biopsy in interventional radiology -Plans are for IR guided aspiration on 2/18 -Antibiotics to be started by ID based on aspiration results  Acute kidney injury(baseline creatinine is 0.71) -Patient Creatinine now within normal limits.  -Patient eating and drinking normally decrease normal saline to KVO   Hypothyroidism -TSH within normal limit -Continue Synthroid 225 g daily  HTN -Controlled without BP medication -Continue normal saline at [place] -Most likely secondary to patient's extremely low BP have resolved   Diarrhea -Positive for C. Difficile. Started on oral vancomycin per ID  Paraplegia -Patient has paraplegia, has chronic Foley catheter in place; when last change?   Code Status: FULL Family Communication: no family present at time of exam Disposition Plan: Resolution sepsis    Consultants: Dr.Cornelius N Tommy Medal (ID) Neurosurgery, Dr. Kathyrn Sheriff Interventional radiology    Procedure/Significant Events: 05/14/2009 MRI T-spine/L-spine with and without contrast;Large extramedullary fluid collection in the thoracic spine from T4- T11. This is compressing the cord. -marked cord atrophy especially at T7-8 and below. Could be  related to chronic hematoma as some of the signal characteristics suggests layering blood.  -non cystic mass within the spinal canal compressing the cord at the T7 level could be hematoma.  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 -Extensive enhancement large fluid collection between L2-L3 vertebral bodies extending into the facet joints bilaterally, concerning for osteomyelitis and abscess,    Culture 2/10 blood left antecubital/arm NGTD 2/10 urine multiple bacterial morphotypes 2/10 C. difficile positive by PCR, (ID believes colonization)   Antibiotics: Zosyn 2/10>> stopped 2/12 Vancomycin 2/10>> stopped 2/12, restarted on 2/16  DVT prophylaxis: Discontinue Lovenox 2/16 for procedure on Wednesday by IR SCD until procedure is complete and then restart Lovenox  Devices    LINES / TUBES:      Continuous Infusions: . sodium chloride 10 mL/hr at 05/12/14 2000    Objective: VITAL SIGNS: Temp: 98.6 F (37 C) (02/17 1700) Temp Source: Oral (02/17 1700) BP: 130/67 mmHg (02/17 1100) Pulse Rate: 80 (02/17 1100) SPO2; FIO2:   Intake/Output Summary (Last 24 hours) at 05/14/14 1813 Last data filed at 05/14/14 1700  Gross per 24 hour  Intake    360 ml  Output   2350 ml  Net  -1990 ml     Exam: General: A/O 4, NAD, No acute respiratory distress Lungs: Clear to auscultation bilaterally without wheezes or crackles Cardiovascular: Regular rate and rhythm without murmur gallop or rub normal S1 and S2 Abdomen: Morbidly Obese, Nontender, nondistended, soft, bowel sounds positive, no rebound, no ascites, no appreciable mass Extremities: No significant cyanosis, positive clubbing, positive 2+  bilateral lower extremities edema     Data Reviewed: Basic Metabolic Panel:  Recent Labs Lab 05/08/14 0505 05/09/14 0900 05/10/14 0320 05/12/14 0245 05/13/14 0305  NA 140 140 135 137 137  K 4.3 3.9 3.4* 3.3* 3.8  CL 107 110 105 106 105  CO2 22 17* 19 25 26    GLUCOSE 85 72 101* 110* 104*  BUN 51* 19 9 <5* 5*  CREATININE 0.89 0.71 0.51 0.31* 0.46*  CALCIUM 8.5 8.8 8.0* 8.0* 8.1*  MG  --  1.8 1.4*  --  1.6   Liver Function Tests:  Recent Labs Lab 05/08/14 0505 05/09/14 0900 05/10/14 0320 05/13/14 0305  AST 14 16 17 18   ALT 22 17 16 13   ALKPHOS 183* 175* 149* 99  BILITOT 0.7 1.0 0.7 0.4  PROT 6.6 6.7 6.4 6.0  ALBUMIN 2.5* 2.4* 2.2* 2.3*   No results for input(s): LIPASE, AMYLASE in the last 168 hours. No results for input(s): AMMONIA in the last 168 hours. CBC:  Recent Labs Lab 05/08/14 0505 05/09/14 0900 05/10/14 0320 05/13/14 0305  WBC 13.6* 8.3 8.7 8.4  NEUTROABS  --   --  5.7  --   HGB 9.0* 9.3* 9.0* 9.3*  HCT 29.2* 30.8* 29.0* 29.7*  MCV 86.6 87.3 86.6 83.4  PLT 304 345 325 268   Cardiac Enzymes: No results for input(s): CKTOTAL, CKMB, CKMBINDEX, TROPONINI in the last 168 hours. BNP (last 3 results)  Recent Labs  03/29/14 0429  BNP 73.0    ProBNP (last 3 results) No results for input(s): PROBNP in the last 8760 hours.  CBG: No results for input(s): GLUCAP in the last 168 hours.  Recent Results (from the past 240 hour(s))  Culture, blood (routine x 2)     Status: None   Collection Time: 05/07/14  3:30 PM  Result Value Ref Range Status   Specimen Description BLOOD LEFT ANTECUBITAL  Final  Special Requests BOTTLES DRAWN AEROBIC AND ANAEROBIC 6CC  Final   Culture NO GROWTH 7 DAYS  Final   Report Status 05/14/2014 FINAL  Final  Culture, blood (routine x 2)     Status: None   Collection Time: 05/07/14  3:45 PM  Result Value Ref Range Status   Specimen Description BLOOD LEFT ARM DRAWN BY RN DMA  Final   Special Requests BOTTLES DRAWN AEROBIC AND ANAEROBIC 6CC  Final   Culture NO GROWTH 7 DAYS  Final   Report Status 05/14/2014 FINAL  Final  Urine culture     Status: None   Collection Time: 05/07/14  4:38 PM  Result Value Ref Range Status   Specimen Description URINE, CATHETERIZED  Final   Special  Requests NONE  Final   Colony Count   Final    70,000 COLONIES/ML Performed at [facility]    Culture   Final    Multiple bacterial morphotypes present, none predominant. Suggest appropriate recollection if clinically indicated. Performed at [facility]    Report Status 05/09/2014 FINAL  Final  Clostridium Difficile by PCR     Status: Abnormal   Collection Time: 05/07/14  9:30 PM  Result Value Ref Range Status   C difficile by pcr POSITIVE (A) NEGATIVE Final    Comment: RESULT CALLED TO, READ BACK BY AND VERIFIED WITH: KEITH A AT 0154 ON [id number] BY FORSYTH K   MRSA PCR Screening     Status: None   Collection Time: 05/07/14  9:30 PM  Result Value Ref Range Status   MRSA by PCR NEGATIVE NEGATIVE Final    Comment:        The GeneXpert MRSA Assay (FDA approved for NASAL specimens only), is one component of a comprehensive MRSA colonization surveillance program. It is not intended to diagnose MRSA infection nor to guide or monitor treatment for MRSA infections.      Studies:  Recent x-ray studies have been reviewed in detail by the Attending Physician  Scheduled Meds:  Scheduled Meds: . aspirin EC  81 mg Oral Daily  . baclofen  10 mg Oral BID  . buPROPion  150 mg Oral Daily  . ezetimibe  10 mg Oral Daily  . gabapentin  400 mg Oral TID  . Gerhardt's butt cream   Topical Daily  . levothyroxine  200 mcg Oral QAC breakfast  . levothyroxine  25 mcg Oral QAC breakfast  . QUEtiapine  75 mg Oral QHS  . vancomycin  125 mg Oral 4 times per day    Time spent on care of this patient: 30 mins   [facility] Office  [phone] Pager - [phone]  On-Call/Text Page:      [email or web address]      password [email or web address]  If 7PM-7AM, please contact night-coverage [email or web address] Password [email or web address] 05/14/2014, 6:13 PM   LOS: 7 days

## 2014-05-14 NOTE — Consult Note (Signed)
Chief Complaint: Chief Complaint  Patient presents with  . Tremors  epidural abscess Discitis/osteomyelitis  Referring Physician(s): Dr Michel Bickers  History of Present Illness: [name] is a 69 y.o. 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 Paraplegic spinal paralysis   . Hypertension   . Bipolar 1 disorder   . Thyroid disease   . Diabetes mellitus without complication   . Obesity   . Pyelonephritis   . COPD with asthma   . GERD (gastroesophageal reflux disease)     Past Surgical History  Procedure Laterality Date  . Back surgery    . Abdominal hysterectomy      Allergies: Codeine and Propranolol hcl  Medications: Prior to Admission medications   Medication Sig Start Date End Date Taking? Authorizing Provider  acetaminophen (TYLENOL) 500 MG tablet Take 500 mg by mouth every 6 (six) hours as needed for pain.   Yes Historical Provider, MD  acidophilus (RISAQUAD) CAPS capsule Take 1 capsule by mouth daily.   Yes Historical Provider, MD  aspirin EC 81 MG tablet Take 81 mg by mouth daily.   Yes Historical Provider, MD  b complex vitamins tablet Take 1 tablet by mouth daily.   Yes Historical Provider, MD  baclofen (LIORESAL) 10 MG tablet Take 10 mg by mouth 2 (two) times daily.    Yes Historical Provider, MD  buPROPion (WELLBUTRIN XL) 150 MG 24 hr tablet Take 150 mg by mouth daily.   Yes Historical  Provider, MD  calcium citrate-vitamin D 500-400 MG-UNIT Chew 1 tablet by mouth 2 (two) times daily.   Yes Historical Provider, MD  carvedilol (COREG) 12.5 MG tablet Take 12.5 mg by mouth 2 (two) times daily with a meal.   Yes Historical Provider, MD  cetirizine (ZYRTEC) 10 MG tablet Take 10 mg by mouth daily.   Yes Historical Provider, MD  colchicine 0.6 MG tablet Take 0.6 mg by mouth 2 (two) times daily.   Yes Historical Provider, MD  Cranberry 475 MG CAPS Take 1 capsule by mouth daily.   Yes Historical Provider, MD  dextromethorphan-guaiFENesin (MUCINEX DM) 30-600 MG per 12 hr tablet Take 1 tablet by mouth 2 (two) times daily as needed for cough.   Yes Historical Provider, MD  diphenhydrAMINE (BENADRYL) 12.5 MG chewable tablet Chew 12.5 mg by mouth 4 (four) times daily as needed for allergies.   Yes Historical Provider, MD  ezetimibe (ZETIA) 10 MG tablet Take 10 mg by mouth daily.   Yes Historical Provider, MD  fentaNYL (DURAGESIC - DOSED MCG/HR) 25 MCG/HR patch Place 1 patch (25 mcg total) onto the skin every 3 (three) days. 04/01/14  Yes Annita Brod, MD  furosemide (LASIX) 40 MG tablet Take 40 mg by mouth daily.   Yes Historical Provider, MD  gabapentin (NEURONTIN) 300 MG capsule Take 300 mg by mouth 3 (three) times daily.   Yes Historical Provider, MD  hydroxypropyl methylcellulose / hypromellose (  ISOPTO TEARS / GONIOVISC) 2.5 % ophthalmic solution Place 1 drop into both eyes as needed for dry eyes.   Yes Historical Provider, MD  iron polysaccharides (POLY-IRON 150) 150 MG capsule Take 150 mg by mouth daily.   Yes Historical Provider, MD  levothyroxine (SYNTHROID, LEVOTHROID) 200 MCG tablet Take 200 mcg by mouth daily. Takes with Synthroid (Levothyroxine) 25 mcg for a total of 265mcg daily at 6:00am   Yes Historical Provider, MD  levothyroxine (SYNTHROID, LEVOTHROID) 25 MCG tablet Take 25 mcg by mouth daily. Takes with Synthroid (Levothyroxine) 29mcg for a total of 276mcg daily at 6:00am    Yes Historical Provider, MD  lisinopril (PRINIVIL,ZESTRIL) 5 MG tablet Take 5 mg by mouth daily.   Yes Historical Provider, MD  montelukast (SINGULAIR) 10 MG tablet Take 10 mg by mouth daily.   Yes Historical Provider, MD  Omega-3 Fatty Acids (SEA-OMEGA PO) Take 1 capsule by mouth 2 (two) times daily.   Yes Historical Provider, MD  QUEtiapine (SEROQUEL) 50 MG tablet Take 50 mg by mouth at bedtime.   Yes Historical Provider, MD  senna-docusate (SENOKOT-S) 8.6-50 MG per tablet Take 1 tablet by mouth at bedtime.   Yes Historical Provider, MD  vitamin C (ASCORBIC ACID) 500 MG tablet Take 500 mg by mouth 2 (two) times daily.   Yes Historical Provider, MD  albuterol (PROVENTIL HFA) 108 (90 BASE) MCG/ACT inhaler Inhale 2 puffs into the lungs every 8 (eight) hours as needed (Asthma).     Historical Provider, MD  alendronate (FOSAMAX) 70 MG tablet Take 70 mg by mouth once a week. Take with a full glass of water on an empty stomach.    Historical Provider, MD  alum & mag hydroxide-simeth (MAALOX/MYLANTA) 200-200-20 MG/5ML suspension Take 30 mLs by mouth every 4 (four) hours as needed for indigestion or heartburn.    Historical Provider, MD  Amino Acids-Protein Hydrolys (FEEDING SUPPLEMENT, PRO-STAT SUGAR FREE 64,) LIQD Take 30 mLs by mouth 3 (three) times daily with meals. Patient not taking: Reported on 05/07/2014 04/01/14   Annita Brod, MD  ipratropium-albuterol (DUONEB) 0.5-2.5 (3) MG/3ML SOLN Take 3 mLs by nebulization every 6 (six) hours as needed (shortness of breath).     Historical Provider, MD  LORazepam (ATIVAN) 0.5 MG tablet Take 0.5 tablets (0.25 mg total) by mouth every 6 (six) hours as needed for anxiety. 04/01/14   Annita Brod, MD  oxycodone (OXY-IR) 5 MG capsule Take 1 capsule (5 mg total) by mouth every 6 (six) hours as needed. 04/01/14   Annita Brod, MD  promethazine (PHENERGAN) 12.5 MG tablet Take 12.5 mg by mouth every 6 (six) hours as needed for nausea or vomiting.    Historical  Provider, MD  QUEtiapine (SEROQUEL) 25 MG tablet Take 0.5 tablets (12.5 mg total) by mouth at bedtime. Patient not taking: Reported on 05/07/2014 04/01/14   Annita Brod, MD  tiZANidine (ZANAFLEX) 2 MG tablet Take 2 mg by mouth every 6 (six) hours as needed for muscle spasms.    Historical Provider, MD     Family History  Problem Relation Age of Onset  . Heart disease Mother     History   Social History  . Marital Status: Single    Spouse Name: N/A  . Number of Children: N/A  . Years of Education: N/A   Social History Main Topics  . Smoking status: Former Research scientist (life sciences)  . Smokeless tobacco: Not on file  . Alcohol Use: No  . Drug Use: No  .  Sexual Activity: No   Other Topics Concern  . None   Social History Narrative     Review of Systems: A 12 point ROS discussed and pertinent positives are indicated in the HPI above.  All other systems are negative.  Review of Systems  Constitutional: Negative for fever, activity change and appetite change.  Respiratory: Negative for chest tightness and shortness of breath.   Cardiovascular: Negative for chest pain.  Gastrointestinal: Positive for diarrhea.  Musculoskeletal: Negative for back pain.  Psychiatric/Behavioral: Negative for behavioral problems and confusion.    Vital Signs: BP 130/67 mmHg  Pulse 80  Temp(Src) 97.8 F (36.6 C) (Oral)  Resp 19  Ht 5\' 7"  (1.702 m)  Wt 108.1 kg (238 lb 5.1 oz)  BMI 37.32 kg/m2  SpO2 97%  Physical Exam  Constitutional: She is oriented to person, place, and time. She appears well-nourished.  Cardiovascular: Normal rate, regular rhythm and normal heart sounds.   No murmur heard. Pulmonary/Chest: Effort normal. She has wheezes.  Abdominal: Soft. Bowel sounds are normal.  Musculoskeletal:  Uses arms well No movement of legs  Neurological: She is alert and oriented to person, place, and time.  Skin: Skin is warm and dry.  Psychiatric: She has a normal mood and affect. Her behavior is  normal. Thought content normal.  Nursing note and vitals reviewed.   Mallampati Score:  MD Evaluation Airway: WNL Heart: WNL Abdomen: WNL Chest/ Lungs: WNL ASA  Classification: 3 Mallampati/Airway Score: Two  Imaging: Mr Thoracic Spine W Wo Contrast  05/08/2014   : ADDENDUM REPORT: 05/08/2014 19:55  ADDENDUM: Critical Value/emergent results were called by telephone at the time of interpretation on 05/08/2014 at 7:55 pm to Dr. Darrick Meigs , who verbally acknowledged these results.  Electronically Signed By: Franchot Gallo M.D. On: 05/08/2014 19:55  CLINICAL DATA: History of spinal abscess. Sepsis. Elevated white blood count 22000. Hypertension. Abnormal x-ray at L2-3.  EXAM: MRI THORACIC AND LUMBAR SPINE WITHOUT AND WITH CONTRAST  TECHNIQUE: Multiplanar and multiecho pulse sequences of the thoracic and lumbar spine were obtained without and with intravenous contrast.  CONTRAST: 66mL MULTIHANCE GADOBENATE DIMEGLUMINE 529 MG/ML IV SOLN  COMPARISON: Thoracic MRI 07/10/2006 and 06/30/2006  FINDINGS: MR THORACIC SPINE FINDINGS  Image quality degraded by motion.  Multiple hemangiomata in the thoracic spine vertebral bodies including T4, T8, T10, T12. Negative for fracture. Postcontrast imaging significantly degraded by motion however no evidence of neoplasm is identified  The spinal canal is markedly abnormal. There is a large fluid collection in the spinal canal beginning at approximately T3 and extending third T11. Based on axial images, this appears to be extramedullary and has CSF signal intensity. There is some layering low signal material within the cyst on gradient echo imaging suggesting there may be blood within the fluid. This is compressing the cord beginning at T4 and extending through T12. The cord is difficult to identify below T7 and appears markedly atrophic. Postcontrast imaging does not have significant dural enhancement suggesting epidural abscess.  Large mass within the spinal canal the T7 level  of uncertain etiology. This is low signal on T1 and T2 and could represent a hematoma or large calcified disc fragment. This also is contributing to significant cord compression.  Multilevel disc degeneration and spondylosis most prominent at C6-7, T7-8, T8-9, and T9-10 contribute to spinal stenosis.  There has been decompressive laminectomy T10 through T12. Correlate with prior surgical findings.  MR LUMBAR SPINE FINDINGS  Image quality degraded by significant motion.  Bony destruction  at L2-3 centered at the disc space with extensive fluid between the upper L2 lower L3 vertebral bodies. This area shows diffuse enhancement following contrast infusion. In addition, there is diffuse dural enhancement in the lower lumbar canal suggesting spinal infection and epidural infection. Enhancement extends into the facet joints bilaterally. There is rotation of L2 on L3 secondary to instability. There is severe spinal stenosis at L2-3 with marked compression of the thecal sac.  Epidural fluid collection extends to T11 with compression of the cord as described above. Conus medullaris terminates at T12-L1. Possible syrinx in the distal conus.  Chronic degenerative changes are present throughout the lumbar spine involving the disc spaces and facet joints.  IMPRESSION: Image quality is significantly degraded by motion in the thoracic and lumbar spine.  Large extramedullary fluid collection in the thoracic spine from T4 through T11. This is compressing the cord. There is marked cord atrophy especially at T7-8 and below. This fluid collection could be related to chronic hematoma as some of the signal characteristics suggests layering blood. There is also a non cystic mass within the spinal canal compressing the cord at the T7 level which could be hematoma. Calcified disc or epidural mass are other considerations.  The marked destructive process at L2-3 with severe spinal stenosis. There is extensive enhancement of a large fluid  collection between the L2 and L3 vertebral bodies extending into the facet joints bilaterally, concerning for osteomyelitis and abscess, particularly given the patient's clinical findings.   Electronically Signed   By: Franchot Gallo M.D.   On: 05/08/2014 20:03   Mr Lumbar Spine W Wo Contrast  05/08/2014   ADDENDUM REPORT: 05/08/2014 19:55  ADDENDUM: Critical Value/emergent results were called by telephone at the time of interpretation on 05/08/2014 at 7:55 pm to Dr. Darrick Meigs , who verbally acknowledged these results.   Electronically Signed   By: Franchot Gallo M.D.   On: 05/08/2014 19:55   05/08/2014   CLINICAL DATA:  History of spinal abscess. Sepsis. Elevated white blood count 22000. Hypertension. Abnormal x-ray at L2-3.  EXAM: MRI THORACIC AND LUMBAR SPINE WITHOUT AND WITH CONTRAST  TECHNIQUE: Multiplanar and multiecho pulse sequences of the thoracic and lumbar spine were obtained without and with intravenous contrast.  CONTRAST:  74mL MULTIHANCE GADOBENATE DIMEGLUMINE 529 MG/ML IV SOLN  COMPARISON:  Thoracic MRI 07/10/2006 and 06/30/2006  FINDINGS: MR THORACIC SPINE FINDINGS  Image quality degraded by motion.  Multiple hemangiomata in the thoracic spine vertebral bodies including T4, T8, T10, T12. Negative for fracture. Postcontrast imaging significantly degraded by motion however no evidence of neoplasm is identified  The spinal canal is markedly abnormal. There is a large fluid collection in the spinal canal beginning at approximately T3 and extending third T11. Based on axial images, this appears to be extramedullary and has CSF signal intensity. There is some layering low signal material within the cyst on gradient echo imaging suggesting there may be blood within the fluid. This is compressing the cord beginning at T4 and extending through T12. The cord is difficult to identify below T7 and appears markedly atrophic. Postcontrast imaging does not have significant dural enhancement suggesting epidural  abscess.  Large mass within the spinal canal the T7 level of uncertain etiology. This is low signal on T1 and T2 and could represent a hematoma or large calcified disc fragment. This also is contributing to significant cord compression.  Multilevel disc degeneration and spondylosis most prominent at C6-7, T7-8, T8-9, and T9-10 contribute to spinal stenosis.  There has been decompressive laminectomy T10 through T12. Correlate with prior surgical findings.  MR LUMBAR SPINE FINDINGS  Image quality degraded by significant motion.  Bony destruction at L2-3 centered at the disc space with extensive fluid between the upper L2 lower L3 vertebral bodies. This area shows diffuse enhancement following contrast infusion. In addition, there is diffuse dural enhancement in the lower lumbar canal suggesting spinal infection and epidural infection. Enhancement extends into the facet joints bilaterally. There is rotation of L2 on L3 secondary to instability. There is severe spinal stenosis at L2-3 with marked compression of the thecal sac.  Epidural fluid collection extends to T11 with compression of the cord as described above. Conus medullaris terminates at T12-L1. Possible syrinx in the distal conus.  Chronic degenerative changes are present throughout the lumbar spine involving the disc spaces and facet joints.  IMPRESSION: Image quality is significantly degraded by motion in the thoracic and lumbar spine.  Large extramedullary fluid collection in the thoracic spine from T4 through T11. This is compressing the cord. There is marked cord atrophy especially at T7-8 and below. This fluid collection could be related to chronic hematoma as some of the signal characteristics suggests layering blood. There is also a non cystic mass within the spinal canal compressing the cord at the T7 level which could be hematoma. Calcified disc or epidural mass are other considerations.  The marked destructive process at L2-3 with severe spinal  stenosis. There is extensive enhancement of a large fluid collection between the L2 and L3 vertebral bodies extending into the facet joints bilaterally, concerning for osteomyelitis and abscess, particularly given the patient's clinical findings.  Electronically Signed: By: Franchot Gallo M.D. On: 05/08/2014 19:46   Dg Abd Acute W/chest  05/07/2014   CLINICAL DATA:  69 year old female with diaphoresis, diarrhea and hypotension.  EXAM: ACUTE ABDOMEN SERIES (ABDOMEN 2 VIEW & CHEST 1 VIEW)  COMPARISON:  Prior chest x-ray 03/31/2014 ; CT abdomen/pelvis 06/30/2006  FINDINGS: Cardiac and mediastinal contours are within normal limits. Bibasilar atelectasis. No focal airspace consolidation. No evidence of free air on the up right radiograph. Multiple radiographs are used to encompass the entirety of the abdomen. Radiographs are limited by underpenetration secondary to patient body habitus. No evidence of small bowel obstruction. Moderate colonic stool burden. Advanced destructive changes at the L1-L2 level  IMPRESSION: 1. No acute cardiopulmonary process. 2. Normal, not obstructed bowel gas pattern. 3. Advanced destructive changes are noted at the L1-L2 level which represents an interval finding compared to a remote CT scan of the abdomen from April of 2008. More precise acuity of these findings is limited without prior imaging for comparison. If there is clinical concern for osteomyelitis/discitis consider further evaluation with MRI.   Electronically Signed   By: Jacqulynn Cadet M.D.   On: 05/07/2014 17:07    Labs:  CBC:  Recent Labs  05/08/14 0505 05/09/14 0900 05/10/14 0320 05/13/14 0305  WBC 13.6* 8.3 8.7 8.4  HGB 9.0* 9.3* 9.0* 9.3*  HCT 29.2* 30.8* 29.0* 29.7*  PLT 304 345 325 268    COAGS:  Recent Labs  03/27/14 1250  INR 1.26    BMP:  Recent Labs  05/09/14 0900 05/10/14 0320 05/12/14 0245 05/13/14 0305  NA 140 135 137 137  K 3.9 3.4* 3.3* 3.8  CL 110 105 106 105  CO2 17*  19 25 26   GLUCOSE 72 101* 110* 104*  BUN 19 9 <5* 5*  CALCIUM 8.8 8.0* 8.0* 8.1*  CREATININE 0.71  0.51 0.31* 0.46*  GFRNONAA 88* >90 >90 >90  GFRAA >90 >90 >90 >90    LIVER FUNCTION TESTS:  Recent Labs  05/08/14 0505 05/09/14 0900 05/10/14 0320 05/13/14 0305  BILITOT 0.7 1.0 0.7 0.4  AST 14 16 17 18   ALT 22 17 16 13   ALKPHOS 183* 175* 149* 99  PROT 6.6 6.7 6.4 6.0  ALBUMIN 2.5* 2.4* 2.2* 2.3*    TUMOR MARKERS: No results for input(s): AFPTM, CEA, CA199, CHROMGRNA in the last 8760 hours.  Assessment and Plan:  Paraplegic Living in SNF Osteomyelitis L2 and 3 Discitis L2-3 Epidural abscess thoracic spine Scheduled now for disc aspiration and bx at Lumbar 2-3  Pt aware of procedure benefits and risks including but not limited to Infection; bleeding; damage to bone or cord or surrounding structures Agreeable to proceed Consent signed andin chart   Thank you for this interesting consult.  I greatly enjoyed meeting LURIE MULLANE and look forward to participating in their care.  Signed: Greta Yung A 05/14/2014, 1:26 PM  I spent a total of 40 Minutes  in face to face in clinical consultation, greater than 50% of which was counseling/coordinating care for Lumbar 2-3 disc asp and bx

## 2014-05-15 ENCOUNTER — Inpatient Hospital Stay (HOSPITAL_COMMUNITY): Payer: Medicare Other

## 2014-05-15 MED ORDER — MIDAZOLAM HCL 2 MG/2ML IJ SOLN
INTRAMUSCULAR | Status: AC | PRN
  Administered 2014-05-15: 1 mg via INTRAVENOUS

## 2014-05-15 MED ORDER — FENTANYL CITRATE 0.05 MG/ML IJ SOLN
INTRAMUSCULAR | Status: AC
  Filled 2014-05-15: qty 6

## 2014-05-15 MED ORDER — SODIUM CHLORIDE 0.9 % IJ SOLN
INTRAMUSCULAR | Status: AC
  Filled 2014-05-15: qty 10

## 2014-05-15 MED ORDER — FENTANYL CITRATE 0.05 MG/ML IJ SOLN
INTRAMUSCULAR | Status: AC | PRN
  Administered 2014-05-15: 25 ug via INTRAVENOUS

## 2014-05-15 MED ORDER — MIDAZOLAM HCL 2 MG/2ML IJ SOLN
INTRAMUSCULAR | Status: AC
  Filled 2014-05-15: qty 6

## 2014-05-15 MED ORDER — HYDROMORPHONE HCL 1 MG/ML IJ SOLN
INTRAMUSCULAR | Status: AC
  Filled 2014-05-15: qty 1

## 2014-05-15 MED ORDER — BUPIVACAINE HCL (PF) 0.25 % IJ SOLN
INTRAMUSCULAR | Status: AC
  Filled 2014-05-15: qty 30

## 2014-05-15 NOTE — Procedures (Signed)
S/P L2-L3 disc aspiration with fluoro  Guidance.

## 2014-05-15 NOTE — Sedation Documentation (Signed)
Pt placed in prone position to monitor. Tolerating well.

## 2014-05-15 NOTE — Progress Notes (Addendum)
[facility] TEAM 1 - Stepdown/ICU TEAM Progress Note  [name] WER:[id number] DOB: 08-01-1945 DOA: 05/07/2014 PCP: [name], MD  Admit HPI / Brief Narrative: 69 year old WF PMHx ; Bipolar 1 disorder, anxiety, chronic pain syndrome, Paraplegic spinal paralysis; Hypertension; Thyroid disease; Diabetes mellitus without complication; Obesity; Pyelonephritis; COPD with asthma; and GERD (gastroesophageal reflux disease). Patient was brought from [place] home after she complained of jerking tremors to both upper extremities for past 3 days. In the ED patient was found to be hypotensive with blood pressure 82/52, also patient found to have abnormal UA and complained of auditory hallucinations. Patient started on vancomycin and Zosyn for sepsis due to UTI. Lactic acid was normal with 1.55 and repeat lactic acid 0.76. Patient did have high white count of 22.1. She complains of diarrhea for past few days. Denies nausea or vomiting. Patient is alert and oriented 3, able to answer all the questions she is mentating very well despite the low blood pressure. Abdominal x-ray showed advanced destructive changes in the L1-L2 level, with recommendation for MRI for possible osteomyleitis/discitis for further evaluation. Patient has chronic Foley catheter, she was diagnosed with UTI recently on January 27.   HPI/Subjective: upset that she is still NPO   On 2/18 , did not have IR procedure yesterday. She denies any other symptoms .  She  Lives in [place]   Assessment/Plan: Bipolar 1 disorder -Continue Seroquel 75 mg  QHS   Anxiety -Continue Lorazepam 0.5 mg q 4 hr PRN   Chronic pain syndrome (primary team should only adjust pain medication)  continue-Oxycodone 5 mg q 6 hr PRN Pain (home regimen as prescribed)  continue Neurontin 500 mg TID -Continue Zanaflex 2 mg q 6hr PRN  pain regimen reviewed  Drug seeking behavior -Patient continues to demand increasing doses of narcotics even though the  description of her pain is neuropathic and her gabapentin has been increased over her home dose on admission.  Sepsis unspecified organism -Sepsis physiology resolved -Continue to monitor CBC, CMP, magnesium  Destructive changes at L1-L2 on x-ray -X-ray of the abdomen showed destructive changes of L1-L2,  - MRI of the lumbar spine possible abscess/osteitis/discitis.  -Per ID, DC all antibiotics will obtain fluid from hematoma and back by IR. -Counseled patient that IR had requested neurosurgery see her before they would perform requested LP procedure. -Patient evaluated by Dr.Neelesh Nundkumar (neurosurgery) who agreed to see patient and it was his recommendation to proceed with biopsy in interventional radiology -Plans are for IR guided aspiration on 2/18 -Antibiotics to be started by ID based on aspiration culture results , AFB   patient may need insertion of a PICC line for long-term antibiotics    Acute kidney injury(baseline creatinine is 0.71) -Patient Creatinine now within normal limits.  0.46 on 2/18     Hypothyroidism -TSH within normal limit -Continue Synthroid 225 g daily  HTN -Controlled without BP medication -Continue normal saline at [place] -Most likely secondary to patient's extremely low BP have resolved   Diarrhea -Positive for C. Difficile.  Continue on oral vancomycin per ID  Paraplegia -Patient has paraplegia, has chronic Foley catheter in place; when last change?   Code Status: FULL Family Communication: no family present at time of exam Disposition Plan:  Pending results the patient's culture    Consultants: Dr.Cornelius N Tommy Medal (ID) Neurosurgery, Dr. Kathyrn Sheriff Interventional radiology    Procedure/Significant Events: 05/14/2009 MRI T-spine/L-spine with and without contrast;Large extramedullary fluid collection in the thoracic spine from  T4- T11. This is compressing the cord. -marked cord atrophy especially at T7-8 and below. Could  be related to chronic hematoma as some of the signal characteristics suggests layering blood.  -non cystic mass within the spinal canal compressing the cord at the T7 level could be hematoma.  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 -Extensive enhancement large fluid collection between L2-L3 vertebral bodies extending into the facet joints bilaterally, concerning for osteomyelitis and abscess,    Culture 2/10 blood left antecubital/arm NGTD 2/10 urine multiple bacterial morphotypes 2/10 C. difficile positive by PCR, (ID believes colonization)   Antibiotics: Zosyn 2/10>> stopped 2/12 Vancomycin 2/10>> stopped 2/12, restarted on 2/16  DVT prophylaxis: Discontinue Lovenox 2/16 for procedure on Wednesday by IR SCD until procedure is complete and then restart Lovenox  Devices    LINES / TUBES:      Continuous Infusions: . sodium chloride 10 mL/hr at 05/12/14 2000    Objective: VITAL SIGNS: Temp: 98.4 F (36.9 C) (02/18 0746) Temp Source: Oral (02/18 0746) BP: 139/87 mmHg (02/18 1255) Pulse Rate: 93 (02/18 1300) SPO2; FIO2:   Intake/Output Summary (Last 24 hours) at 05/15/14 1300 Last data filed at 05/14/14 2100  Gross per 24 hour  Intake      0 ml  Output   1600 ml  Net  -1600 ml     Exam: General: A/O 4, NAD, No acute respiratory distress Lungs: Clear to auscultation bilaterally without wheezes or crackles Cardiovascular: Regular rate and rhythm without murmur gallop or rub normal S1 and S2 Abdomen: Morbidly Obese, Nontender, nondistended, soft, bowel sounds positive, no rebound, no ascites, no appreciable mass Extremities: No significant cyanosis, positive clubbing, positive 2+  bilateral lower extremities edema     Data Reviewed: Basic Metabolic Panel:  Recent Labs Lab 05/09/14 0900 05/10/14 0320 05/12/14 0245 05/13/14 0305  NA 140 135 137 137  K 3.9 3.4* 3.3* 3.8  CL 110 105 106 105  CO2 17* 19 25 26   GLUCOSE 72 101* 110*  104*  BUN 19 9 <5* 5*  CREATININE 0.71 0.51 0.31* 0.46*  CALCIUM 8.8 8.0* 8.0* 8.1*  MG 1.8 1.4*  --  1.6   Liver Function Tests:  Recent Labs Lab 05/09/14 0900 05/10/14 0320 05/13/14 0305  AST 16 17 18   ALT 17 16 13   ALKPHOS 175* 149* 99  BILITOT 1.0 0.7 0.4  PROT 6.7 6.4 6.0  ALBUMIN 2.4* 2.2* 2.3*   No results for input(s): LIPASE, AMYLASE in the last 168 hours. No results for input(s): AMMONIA in the last 168 hours. CBC:  Recent Labs Lab 05/09/14 0900 05/10/14 0320 05/13/14 0305  WBC 8.3 8.7 8.4  NEUTROABS  --  5.7  --   HGB 9.3* 9.0* 9.3*  HCT 30.8* 29.0* 29.7*  MCV 87.3 86.6 83.4  PLT 345 325 268   Cardiac Enzymes: No results for input(s): CKTOTAL, CKMB, CKMBINDEX, TROPONINI in the last 168 hours. BNP (last 3 results)  Recent Labs  03/29/14 0429  BNP 73.0    ProBNP (last 3 results) No results for input(s): PROBNP in the last 8760 hours.  CBG: No results for input(s): GLUCAP in the last 168 hours.  Recent Results (from the past 240 hour(s))  Culture, blood (routine x 2)     Status: None   Collection Time: 05/07/14  3:30 PM  Result Value Ref Range Status   Specimen Description BLOOD LEFT ANTECUBITAL  Final   Special Requests BOTTLES DRAWN AEROBIC AND ANAEROBIC [place]  Final  Culture NO GROWTH 7 DAYS  Final   Report Status 05/14/2014 FINAL  Final  Culture, blood (routine x 2)     Status: None   Collection Time: 05/07/14  3:45 PM  Result Value Ref Range Status   Specimen Description BLOOD LEFT ARM DRAWN BY RN DMA  Final   Special Requests BOTTLES DRAWN AEROBIC AND ANAEROBIC 6CC  Final   Culture NO GROWTH 7 DAYS  Final   Report Status 05/14/2014 FINAL  Final  Urine culture     Status: None   Collection Time: 05/07/14  4:38 PM  Result Value Ref Range Status   Specimen Description URINE, CATHETERIZED  Final   Special Requests NONE  Final   Colony Count   Final    70,000 COLONIES/ML Performed at [facility]    Culture   Final     Multiple bacterial morphotypes present, none predominant. Suggest appropriate recollection if clinically indicated. Performed at [facility]    Report Status 05/09/2014 FINAL  Final  Clostridium Difficile by PCR     Status: Abnormal   Collection Time: 05/07/14  9:30 PM  Result Value Ref Range Status   C difficile by pcr POSITIVE (A) NEGATIVE Final    Comment: RESULT CALLED TO, READ BACK BY AND VERIFIED WITH: KEITH A AT 0154 ON [id number] BY FORSYTH K   MRSA PCR Screening     Status: None   Collection Time: 05/07/14  9:30 PM  Result Value Ref Range Status   MRSA by PCR NEGATIVE NEGATIVE Final    Comment:        The GeneXpert MRSA Assay (FDA approved for NASAL specimens only), is one component of a comprehensive MRSA colonization surveillance program. It is not intended to diagnose MRSA infection nor to guide or monitor treatment for MRSA infections.      Studies:  Recent x-ray studies have been reviewed in detail by the Attending Physician  Scheduled Meds:  Scheduled Meds: . aspirin EC  81 mg Oral Daily  . baclofen  10 mg Oral BID  . bupivacaine (PF)      . buPROPion  150 mg Oral Daily  . ezetimibe  10 mg Oral Daily  . fentaNYL      . gabapentin  400 mg Oral TID  . Gerhardt's butt cream   Topical Daily  . HYDROmorphone      . levothyroxine  200 mcg Oral QAC breakfast  . levothyroxine  25 mcg Oral QAC breakfast  . midazolam      . QUEtiapine  75 mg Oral QHS  . sodium chloride      . vancomycin  125 mg Oral 4 times per day    Time spent on care of this patient: 30 mins   [facility] Office  [phone] Pager - [phone]  On-Call/Text Page:      [email or web address]      password [email or web address]  If 7PM-7AM, please contact night-coverage [email or web address] Password [email or web address] 05/15/2014, 1:00 PM   LOS: 8 days

## 2014-05-15 NOTE — Sedation Documentation (Signed)
Patient denies pain and is resting comfortably.  

## 2014-05-15 NOTE — Sedation Documentation (Signed)
Patient is resting comfortably. 

## 2014-05-15 NOTE — Sedation Documentation (Signed)
Transferring to procedure room, IR2

## 2014-05-15 NOTE — Sedation Documentation (Addendum)
Pt arrived to nurses station,AAOx4.  C/o headache pain so lights dimmed. Stated she got pain meds at 9.  Ordered every 6hrs.  Nothing by mouth right now.  Offered tylenol suppository, declined. Requested TV on, done.  Explained my role as nurse during her procedure and answered questions. Pt on Contact precautions for Cdiff.

## 2014-05-15 NOTE — Sedation Documentation (Signed)
Cleaned pt of loose watery yellow stool in IR2.

## 2014-05-15 NOTE — Progress Notes (Signed)
Patient ID: [name], female   DOB: September 10, 1945, 69 y.o.   MRN: [id number]         [facility] for Infectious Disease    Date of Admission:  05/07/2014           Day 3 oral vancomycin  Active Problems:   Paraplegic spinal paralysis   Thyroid disease   Sepsis   Diarrhea   Hypothyroid   ARF (acute renal failure)   Enteritis due to Clostridium difficile   UTI (urinary tract infection)   Chronic indwelling Foley catheter   Osteomyelitis   Discitis of thoracolumbar region   Anxiety state   Chronic pain syndrome   Sepsis due to Candida species   Spondylarthrosis   Acute renal failure syndrome   Other specified hypothyroidism   Essential hypertension   Paraplegia   Abscess in epidural space of lumbar spine   Blood poisoning   Discitis of lumbar region   . aspirin EC  81 mg Oral Daily  . baclofen  10 mg Oral BID  . buPROPion  150 mg Oral Daily  . ezetimibe  10 mg Oral Daily  . gabapentin  400 mg Oral TID  . Gerhardt's butt cream   Topical Daily  . levothyroxine  200 mcg Oral QAC breakfast  . levothyroxine  25 mcg Oral QAC breakfast  . QUEtiapine  75 mg Oral QHS  . vancomycin  125 mg Oral 4 times per day    Subjective: She continues to have some frequent watery stools. She's irritated and frustrated about being n.p.o. for the third day pending aspiration of her paravertebral abscess.  Review of Systems: Pertinent items are noted in HPI.  Past Medical History  Diagnosis Date  . Paraplegic spinal paralysis   . Hypertension   . Bipolar 1 disorder   . Thyroid disease   . Diabetes mellitus without complication   . Obesity   . Pyelonephritis   . COPD with asthma   . GERD (gastroesophageal reflux disease)     History  Substance Use Topics  . Smoking status: Former Research scientist (life sciences)  . Smokeless tobacco: Not on file  . Alcohol Use: No    Family History  Problem Relation Age of Onset  . Heart disease Mother    Allergies  Allergen Reactions  . Codeine   .  Propranolol Hcl     OBJECTIVE: Blood pressure 128/79, pulse 82, temperature 98.4 F (36.9 C), temperature source Oral, resp. rate 18, height 5\' 7"  (1.702 m), weight 238 lb 5.1 oz (108.1 kg), SpO2 97 %. General: irritated but otherwise in no distress Abdomen: Soft and nontender with quiet bowel sounds  Lab Results Lab Results  Component Value Date   WBC 8.4 05/13/2014   HGB 9.3* 05/13/2014   HCT 29.7* 05/13/2014   MCV 83.4 05/13/2014   PLT 268 05/13/2014    Lab Results  Component Value Date   CREATININE 0.46* 05/13/2014   BUN 5* 05/13/2014   NA 137 05/13/2014   K 3.8 05/13/2014   CL 105 05/13/2014   CO2 26 05/13/2014    Lab Results  Component Value Date   ALT 13 05/13/2014   AST 18 05/13/2014   ALKPHOS 99 05/13/2014   BILITOT 0.4 05/13/2014     Microbiology: Recent Results (from the past 240 hour(s))  Culture, blood (routine x 2)     Status: None   Collection Time: 05/07/14  3:30 PM  Result Value Ref Range Status   Specimen Description BLOOD LEFT  ANTECUBITAL  Final   Special Requests BOTTLES DRAWN AEROBIC AND ANAEROBIC 6CC  Final   Culture NO GROWTH 7 DAYS  Final   Report Status 05/14/2014 FINAL  Final  Culture, blood (routine x 2)     Status: None   Collection Time: 05/07/14  3:45 PM  Result Value Ref Range Status   Specimen Description BLOOD LEFT ARM DRAWN BY RN DMA  Final   Special Requests BOTTLES DRAWN AEROBIC AND ANAEROBIC 6CC  Final   Culture NO GROWTH 7 DAYS  Final   Report Status 05/14/2014 FINAL  Final  Urine culture     Status: None   Collection Time: 05/07/14  4:38 PM  Result Value Ref Range Status   Specimen Description URINE, CATHETERIZED  Final   Special Requests NONE  Final   Colony Count   Final    70,000 COLONIES/ML Performed at [facility]    Culture   Final    Multiple bacterial morphotypes present, none predominant. Suggest appropriate recollection if clinically indicated. Performed at [facility]    Report Status  05/09/2014 FINAL  Final  Clostridium Difficile by PCR     Status: Abnormal   Collection Time: 05/07/14  9:30 PM  Result Value Ref Range Status   C difficile by pcr POSITIVE (A) NEGATIVE Final    Comment: RESULT CALLED TO, READ BACK BY AND VERIFIED WITH: KEITH A AT 0154 ON [id number] BY FORSYTH K   MRSA PCR Screening     Status: None   Collection Time: 05/07/14  9:30 PM  Result Value Ref Range Status   MRSA by PCR NEGATIVE NEGATIVE Final    Comment:        The GeneXpert MRSA Assay (FDA approved for NASAL specimens only), is one component of a comprehensive MRSA colonization surveillance program. It is not intended to diagnose MRSA infection nor to guide or monitor treatment for MRSA infections.     Assessment: I will continue oral vancomycin for treatment of mild, recurrent C. Difficile colitis. I will withhold starting empiric IV antibiotics until after her she has had aspiration of her paravertebral abscess.  Plan: 1. Continue oral vancomycin for now  Michel Bickers, MD [facility] [phone] pager   [phone] cell 05/15/2014, 11:26 AM

## 2014-05-15 NOTE — Sedation Documentation (Signed)
Dr. Estanislado Pandy in speaking with patient. Questions answered.

## 2014-05-16 DIAGNOSIS — G061 Intraspinal abscess and granuloma: Secondary | ICD-10-CM

## 2014-05-16 DIAGNOSIS — B9689 Other specified bacterial agents as the cause of diseases classified elsewhere: Secondary | ICD-10-CM

## 2014-05-16 MED ORDER — VANCOMYCIN HCL 10 G IV SOLR
1250.0000 mg | Freq: Two times a day (BID) | INTRAVENOUS | Status: DC
  Administered 2014-05-16 – 2014-05-20 (×7): 1250 mg via INTRAVENOUS
  Filled 2014-05-16 (×11): qty 1250

## 2014-05-16 MED ORDER — OXYCODONE HCL 5 MG PO TABS
5.0000 mg | ORAL_TABLET | ORAL | Status: DC | PRN
  Administered 2014-05-16 – 2014-05-20 (×10): 5 mg via ORAL
  Filled 2014-05-16 (×12): qty 1

## 2014-05-16 MED ORDER — CEFTRIAXONE SODIUM IN DEXTROSE 40 MG/ML IV SOLN
2.0000 g | INTRAVENOUS | Status: DC
  Administered 2014-05-16 – 2014-05-20 (×5): 2 g via INTRAVENOUS
  Filled 2014-05-16 (×5): qty 50

## 2014-05-16 MED ORDER — SODIUM CHLORIDE 0.9 % IJ SOLN
10.0000 mL | INTRAMUSCULAR | Status: DC | PRN
  Administered 2014-05-19: 20 mL
  Administered 2014-05-20 (×2): 10 mL
  Filled 2014-05-16 (×3): qty 40

## 2014-05-16 MED ORDER — SODIUM CHLORIDE 0.9 % IJ SOLN
10.0000 mL | Freq: Two times a day (BID) | INTRAMUSCULAR | Status: DC
  Administered 2014-05-18 – 2014-05-19 (×2): 10 mL

## 2014-05-16 MED ORDER — MUPIROCIN CALCIUM 2 % EX CREA
TOPICAL_CREAM | Freq: Two times a day (BID) | CUTANEOUS | Status: DC
  Administered 2014-05-16 – 2014-05-17 (×4): via TOPICAL
  Administered 2014-05-18: 1 via TOPICAL
  Administered 2014-05-19: 10:00:00 via TOPICAL
  Administered 2014-05-19: 1 via TOPICAL
  Administered 2014-05-19 – 2014-05-20 (×2): via TOPICAL
  Filled 2014-05-16 (×2): qty 15

## 2014-05-16 NOTE — Progress Notes (Signed)
[facility] for Infectious Disease    Date of Admission:  05/07/2014           Day 4 oral vancomycin  Active Problems:   Paraplegic spinal paralysis   Thyroid disease   Sepsis   Diarrhea   Hypothyroid   ARF (acute renal failure)   Enteritis due to Clostridium difficile   UTI (urinary tract infection)   Chronic indwelling Foley catheter   Osteomyelitis   Discitis of thoracolumbar region   Anxiety state   Chronic pain syndrome   Sepsis due to Candida species   Spondylarthrosis   Acute renal failure syndrome   Other specified hypothyroidism   Essential hypertension   Paraplegia   Abscess in epidural space of lumbar spine   Blood poisoning   Discitis of lumbar region   . aspirin EC  81 mg Oral Daily  . baclofen  10 mg Oral BID  . buPROPion  150 mg Oral Daily  . ezetimibe  10 mg Oral Daily  . gabapentin  400 mg Oral TID  . Gerhardt's butt cream   Topical Daily  . levothyroxine  200 mcg Oral QAC breakfast  . levothyroxine  25 mcg Oral QAC breakfast  . mupirocin cream   Topical BID  . QUEtiapine  75 mg Oral QHS  . vancomycin  125 mg Oral 4 times per day    Subjective: She is doing well today. She denies any pain. Since she is paraplegic she has no sensation when she is having diarrhea.  Review of Systems: Pertinent items are noted in HPI.  Past Medical History  Diagnosis Date  . Paraplegic spinal paralysis   . Hypertension   . Bipolar 1 disorder   . Thyroid disease   . Diabetes mellitus without complication   . Obesity   . Pyelonephritis   . COPD with asthma   . GERD (gastroesophageal reflux disease)     History  Substance Use Topics  . Smoking status: Former Research scientist (life sciences)  . Smokeless tobacco: Not on file  . Alcohol Use: No    Family History  Problem Relation Age of Onset  . Heart disease Mother    Allergies  Allergen Reactions  . Codeine   . Propranolol Hcl     OBJECTIVE: Blood pressure 135/75, pulse 83, temperature 98.9 F (37.2  C), temperature source Oral, resp. rate 17, height 5\' 7"  (1.702 m), weight 221 lb 12.5 oz (100.6 kg), SpO2 97 %. General: She is smiling and in better spirits today Abdomen: Soft with quiet bowel sounds  MRI THORACIC AND LUMBAR SPINE WITHOUT AND WITH CONTRAST 05/08/2014  IMPRESSION: Image quality is significantly degraded by motion in the thoracic and lumbar spine.  Large extramedullary fluid collection in the thoracic spine from T4 through T11. This is compressing the cord. There is marked cord atrophy especially at T7-8 and below. This fluid collection could be related to chronic hematoma as some of the signal characteristics suggests layering blood. There is also a non cystic mass within the spinal canal compressing the cord at the T7 level which could be hematoma. Calcified disc or epidural mass are other considerations.  The marked destructive process at L2-3 with severe spinal stenosis. There is extensive enhancement of a large fluid collection between the L2 and L3 vertebral bodies extending into the facet joints bilaterally, concerning for osteomyelitis and abscess, particularly given the patient's clinical findings.   Electronically Signed  By: Franchot Gallo M.D.  On: 05/08/2014  20:03  Assessment: She underwent lumbar aspirate in 2 locations yesterday and a total of 4.5 mL of bloody fluid was obtained. No organisms were seen on Gram stain and cultures are pending. 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 She will also remain on oral vancomycin for her mild, relapsed C. difficile colitis.  Plan: 1. Continue oral vancomycin 2. Start IV vancomycin and ceftriaxone pending final culture results 3. PICC placement  Michel Bickers, MD [facility] [place]  [facility] [phone] pager   [phone] cell 05/16/2014, 10:34 AM

## 2014-05-16 NOTE — Progress Notes (Signed)
ANTIBIOTIC CONSULT NOTE - INITIAL  Pharmacy Consult for Vancomycin Indication: Osteomyelitis  Allergies  Allergen Reactions  . Codeine   . Propranolol Hcl     Patient Measurements: Height: 5\' 7"  (170.2 cm) Weight: 221 lb 12.5 oz (100.6 kg) IBW/kg (Calculated) : 61.6  Vital Signs: Temp: 98.9 F (37.2 C) (02/19 0926) Temp Source: Oral (02/19 0926) BP: 135/75 mmHg (02/19 0926) Pulse Rate: 83 (02/19 0926) Intake/Output from previous day: 02/18 0701 - 02/19 0700 In: 960 [P.O.:960] Out: 1275 [Urine:1275] Intake/Output from this shift: Total I/O In: 240 [P.O.:240] Out: -   Labs: No results for input(s): WBC, HGB, PLT, LABCREA, CREATININE in the last 72 hours. Estimated Creatinine Clearance: 84.3 mL/min (by C-G formula based on Cr of 0.46). No results for input(s): VANCOTROUGH, VANCOPEAK, VANCORANDOM, GENTTROUGH, GENTPEAK, GENTRANDOM, TOBRATROUGH, TOBRAPEAK, TOBRARND, AMIKACINPEAK, AMIKACINTROU, AMIKACIN in the last 72 hours.   Microbiology: Recent Results (from the past 720 hour(s))  Culture, blood (routine x 2)     Status: None   Collection Time: 05/07/14  3:30 PM  Result Value Ref Range Status   Specimen Description BLOOD LEFT ANTECUBITAL  Final   Special Requests BOTTLES DRAWN AEROBIC AND ANAEROBIC 6CC  Final   Culture NO GROWTH 7 DAYS  Final   Report Status 05/14/2014 FINAL  Final  Culture, blood (routine x 2)     Status: None   Collection Time: 05/07/14  3:45 PM  Result Value Ref Range Status   Specimen Description BLOOD LEFT ARM DRAWN BY RN DMA  Final   Special Requests BOTTLES DRAWN AEROBIC AND ANAEROBIC 6CC  Final   Culture NO GROWTH 7 DAYS  Final   Report Status 05/14/2014 FINAL  Final  Urine culture     Status: None   Collection Time: 05/07/14  4:38 PM  Result Value Ref Range Status   Specimen Description URINE, CATHETERIZED  Final   Special Requests NONE  Final   Colony Count   Final    70,000 COLONIES/ML Performed at [facility]    Culture    Final    Multiple bacterial morphotypes present, none predominant. Suggest appropriate recollection if clinically indicated. Performed at [facility]    Report Status 05/09/2014 FINAL  Final  Clostridium Difficile by PCR     Status: Abnormal   Collection Time: 05/07/14  9:30 PM  Result Value Ref Range Status   C difficile by pcr POSITIVE (A) NEGATIVE Final    Comment: RESULT CALLED TO, READ BACK BY AND VERIFIED WITH: KEITH A AT 0154 ON [id number] BY FORSYTH K   MRSA PCR Screening     Status: None   Collection Time: 05/07/14  9:30 PM  Result Value Ref Range Status   MRSA by PCR NEGATIVE NEGATIVE Final    Comment:        The GeneXpert MRSA Assay (FDA approved for NASAL specimens only), is one component of a comprehensive MRSA colonization surveillance program. It is not intended to diagnose MRSA infection nor to guide or monitor treatment for MRSA infections.   Culture, routine-abscess     Status: None (Preliminary result)   Collection Time: 05/15/14  1:24 PM  Result Value Ref Range Status   Specimen Description ABSCESS BACK  Final   Special Requests L2 THRU 3 DISC  Final   Gram Stain   Final    RARE WBC PRESENT,BOTH PMN AND MONONUCLEAR NO SQUAMOUS EPITHELIAL CELLS SEEN NO ORGANISMS SEEN Performed at [facility] PENDING  Incomplete   Report Status PENDING  Incomplete    Medical History: Past Medical History  Diagnosis Date  . Paraplegic spinal paralysis   . Hypertension   . Bipolar 1 disorder   . Thyroid disease   . Diabetes mellitus without complication   . Obesity   . Pyelonephritis   . COPD with asthma   . GERD (gastroesophageal reflux disease)     Medications:  Scheduled:  . aspirin EC  81 mg Oral Daily  . baclofen  10 mg Oral BID  . buPROPion  150 mg Oral Daily  . cefTRIAXone (ROCEPHIN)  IV  2 g Intravenous Q24H  . ezetimibe  10 mg Oral Daily  . gabapentin  400 mg Oral TID  . Gerhardt's butt cream   Topical Daily  .  levothyroxine  200 mcg Oral QAC breakfast  . levothyroxine  25 mcg Oral QAC breakfast  . mupirocin cream   Topical BID  . QUEtiapine  75 mg Oral QHS  . vancomycin  125 mg Oral 4 times per day   Assessment: 69 yo female admitted on 05/07/14 with Lumbar discitis osteomyelitis abscess. 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 She underwent surgery in 2012. Also has a history of recurrent C. difficile colitis.  ID had DC'd her IV antibiotics on 05/09/14 in hopes that IR could get aspirate for cultures. Now patient is s/p IR guided lumbar aspirate on yesterday 2/18 for cultures to be obtained for bacteria fungi and AFB.. ID notes that no organisms were seen on gram stain and cultures are pending. ID is restarting IV vancomycin and starting ceftriaxone pending final culture results.  Plan for PICC placement.  SCr  0.46 on 2/17, SCr 0.89 on 2/11; Estimated CrCl ~ 84 ml/min.  I/O 960/1275. Aferbile with Tm 99.4. On 2/16 the WBC was  8.4K down from 13.6K on 05/08/14   Vancomycin 2/10>>2/12;  2/19>> Zosyn 2/10>>2/12 Ceftriaxone 2/19>>  Will also remain on oral vancomycin for her mild, relapsed C. difficile colitis.   Goal of Therapy:  Vancomycin trough level 15-20 mcg/ml  Plan:  Vancomycin 1250 mg IV q12h Monitor clinical status and culture results daily. Vancomycin steady state trough prn per protocol.  Nicole Cella, RPh Clinical Pharmacist Pager: [phone] 05/16/2014, 11:23 AM

## 2014-05-16 NOTE — Progress Notes (Signed)
Peripherally Inserted Central Catheter/Midline Placement  The IV Nurse has discussed with the patient and/or persons authorized to consent for the patient, the purpose of this procedure and the potential benefits and risks involved with this procedure.  The benefits include less needle sticks, lab draws from the catheter and patient may be discharged home with the catheter.  Risks include, but not limited to, infection, bleeding, blood clot (thrombus formation), and puncture of an artery; nerve damage and irregular heat beat.  Alternatives to this procedure were also discussed.  PICC/Midline Placement Documentation  PICC / Midline Single Lumen 31/59/45 PICC Right Basilic 42 cm 0 cm (Active)  Indication for Insertion or Continuance of Line Home intravenous therapies (PICC only) 05/16/2014  3:05 PM  Exposed Catheter (cm) 0 cm 05/16/2014  3:05 PM  Site Assessment Clean;Dry;Intact 05/16/2014  3:05 PM  Line Status Flushed;Saline locked;Blood return noted 05/16/2014  3:05 PM  Dressing Type Transparent 05/16/2014  3:05 PM  Dressing Status Clean;Dry;Intact 05/16/2014  3:05 PM  Dressing Intervention New dressing 05/16/2014  3:05 PM  Dressing Change Due 05/23/14 05/16/2014  3:05 PM       [facility] Payment 05/16/2014, 3:08 PM

## 2014-05-16 NOTE — Progress Notes (Addendum)
[facility] TEAM 1 - Stepdown/ICU TEAM Progress Note  [name] WJX:[id number] DOB: 1946-01-17 DOA: 05/07/2014 PCP: [name], MD  Admit HPI / Brief Narrative: 69 year old WF PMHx ; Bipolar 1 disorder, anxiety, chronic pain syndrome, Paraplegic spinal paralysis; Hypertension; Thyroid disease; Diabetes mellitus without complication; Obesity; Pyelonephritis; COPD with asthma; and GERD (gastroesophageal reflux disease). Patient was brought from [facility] home after she complained of jerking tremors to both upper extremities for past 3 days. In the ED patient was found to be hypotensive with blood pressure 82/52, also patient found to have abnormal UA and complained of auditory hallucinations. Patient started on vancomycin and Zosyn for sepsis due to UTI. Lactic acid was normal with 1.55 and repeat lactic acid 0.76. Patient did have high white count of 22.1. She complains of diarrhea for past few days. Denies nausea or vomiting. Patient is alert and oriented 3, able to answer all the questions she is mentating very well despite the low blood pressure. Abdominal x-ray showed advanced destructive changes in the L1-L2 level, with recommendation for MRI for possible osteomyleitis/discitis for further evaluation. Patient has chronic Foley catheter, she was diagnosed with UTI recently on January 27.   HPI/Subjective: Complaining of uncontrolled pain and requesting oxycodone to be increased to every 4 hours Also complaining of a cyst in her left nostril  Assessment/Plan: Bipolar 1 disorder -Continue Seroquel 75 mg  QHS   Anxiety -Continue Lorazepam 0.5 mg q 4 hr PRN   Chronic pain syndrome (primary team should only adjust pain medication) Increased oxycodone to 5 mg every 4 hours as needed   continue Neurontin 500 mg TID -Continue Zanaflex 2 mg q 6hr PRN and baclofen  pain regimen reviewed   Sepsis unspecified organism -Sepsis physiology resolved -Continue to monitor CBC, CMP,  magnesium  Destructive changes at L1-L2 on x-ray -X-ray of the abdomen showed destructive changes of L1-L2,  - MRI of the lumbar spine possible abscess/osteitis/discitis.  -Per ID, DC all antibiotics will obtain fluid from hematoma and back by IR. -Counseled patient that IR had requested neurosurgery see her before they would perform requested LP procedure. -Patient evaluated by Dr.Neelesh Nundkumar (neurosurgery) who agreed to see patient and it was his recommendation to proceed with biopsy in interventional radiology -Plans are for IR guided aspiration on 2/18 -Antibiotics to be started by ID based on aspiration culture results , AFB  PICC line placement today, discussed with infectious disease To be started on IV vancomycin and ceftriaxone pending final culture results    Acute kidney injury(baseline creatinine is 0.71) -Patient Creatinine now within normal limits.  0.46 on 2/18   Submucosal nasal cyst Probably intranasal folliculitis Start the patient on Bactroban  Hypothyroidism -TSH within normal limit -Continue Synthroid 225 g daily  HTN -Controlled without BP medication   Diarrhea -Positive for C. Difficile.  Continue on oral vancomycin per ID  Paraplegia -Patient has paraplegia, has chronic Foley catheter in place;    Code Status: FULL Family Communication: no family present at time of exam Disposition Plan:  Pending results the patient's culture    Consultants: Dr.Cornelius N Tommy Medal (ID) Neurosurgery, Dr. Kathyrn Sheriff Interventional radiology    Procedure/Significant Events: 05/14/2009 MRI T-spine/L-spine with and without contrast;Large extramedullary fluid collection in the thoracic spine from T4- T11. This is compressing the cord. -marked cord atrophy especially at T7-8 and below. Could be related to chronic hematoma as some of the signal characteristics suggests layering blood.  -non cystic mass within the spinal canal compressing the  cord at the T7  level could be hematoma.  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 -Extensive enhancement large fluid collection between L2-L3 vertebral bodies extending into the facet joints bilaterally, concerning for osteomyelitis and abscess,    Culture 2/10 blood left antecubital/arm NGTD 2/10 urine multiple bacterial morphotypes 2/10 C. difficile positive by PCR, (ID believes colonization)   Antibiotics: Zosyn 2/10>> stopped 2/12 Vancomycin 2/10>> stopped 2/12, restarted on 2/16  DVT prophylaxis: Discontinue Lovenox 2/16 for procedure on Wednesday by IR SCD until procedure is complete and then restart Lovenox  Devices    LINES / TUBES:      Continuous Infusions: . sodium chloride 10 mL/hr at 05/12/14 2000    Objective: VITAL SIGNS: Temp: 98.9 F (37.2 C) (02/19 0926) Temp Source: Oral (02/19 0926) BP: 135/75 mmHg (02/19 0926) Pulse Rate: 83 (02/19 0926) SPO2; FIO2:   Intake/Output Summary (Last 24 hours) at 05/16/14 1245 Last data filed at 05/16/14 1155  Gross per 24 hour  Intake   1200 ml  Output   1925 ml  Net   -725 ml     Exam: General: A/O 4, NAD, No acute respiratory distress Lungs: Clear to auscultation bilaterally without wheezes or crackles Cardiovascular: Regular rate and rhythm without murmur gallop or rub normal S1 and S2 Abdomen: Morbidly Obese, Nontender, nondistended, soft, bowel sounds positive, no rebound, no ascites, no appreciable mass Extremities: No significant cyanosis, positive clubbing, positive 2+  bilateral lower extremities edema     Data Reviewed: Basic Metabolic Panel:  Recent Labs Lab 05/10/14 0320 05/12/14 0245 05/13/14 0305  NA 135 137 137  K 3.4* 3.3* 3.8  CL 105 106 105  CO2 19 25 26   GLUCOSE 101* 110* 104*  BUN 9 <5* 5*  CREATININE 0.51 0.31* 0.46*  CALCIUM 8.0* 8.0* 8.1*  MG 1.4*  --  1.6   Liver Function Tests:  Recent Labs Lab 05/10/14 0320 05/13/14 0305  AST 17 18  ALT 16 13  ALKPHOS  149* 99  BILITOT 0.7 0.4  PROT 6.4 6.0  ALBUMIN 2.2* 2.3*   No results for input(s): LIPASE, AMYLASE in the last 168 hours. No results for input(s): AMMONIA in the last 168 hours. CBC:  Recent Labs Lab 05/10/14 0320 05/13/14 0305  WBC 8.7 8.4  NEUTROABS 5.7  --   HGB 9.0* 9.3*  HCT 29.0* 29.7*  MCV 86.6 83.4  PLT 325 268   Cardiac Enzymes: No results for input(s): CKTOTAL, CKMB, CKMBINDEX, TROPONINI in the last 168 hours. BNP (last 3 results)  Recent Labs  03/29/14 0429  BNP 73.0    ProBNP (last 3 results) No results for input(s): PROBNP in the last 8760 hours.  CBG: No results for input(s): GLUCAP in the last 168 hours.  Recent Results (from the past 240 hour(s))  Culture, blood (routine x 2)     Status: None   Collection Time: 05/07/14  3:30 PM  Result Value Ref Range Status   Specimen Description BLOOD LEFT ANTECUBITAL  Final   Special Requests BOTTLES DRAWN AEROBIC AND ANAEROBIC 6CC  Final   Culture NO GROWTH 7 DAYS  Final   Report Status 05/14/2014 FINAL  Final  Culture, blood (routine x 2)     Status: None   Collection Time: 05/07/14  3:45 PM  Result Value Ref Range Status   Specimen Description BLOOD LEFT ARM DRAWN BY RN DMA  Final   Special Requests BOTTLES DRAWN AEROBIC AND ANAEROBIC 6CC  Final   Culture  NO GROWTH 7 DAYS  Final   Report Status 05/14/2014 FINAL  Final  Urine culture     Status: None   Collection Time: 05/07/14  4:38 PM  Result Value Ref Range Status   Specimen Description URINE, CATHETERIZED  Final   Special Requests NONE  Final   Colony Count   Final    70,000 COLONIES/ML Performed at [facility]    Culture   Final    Multiple bacterial morphotypes present, none predominant. Suggest appropriate recollection if clinically indicated. Performed at [facility]    Report Status 05/09/2014 FINAL  Final  Clostridium Difficile by PCR     Status: Abnormal   Collection Time: 05/07/14  9:30 PM  Result Value Ref Range  Status   C difficile by pcr POSITIVE (A) NEGATIVE Final    Comment: RESULT CALLED TO, READ BACK BY AND VERIFIED WITH: KEITH A AT 0154 ON [id number] BY FORSYTH K   MRSA PCR Screening     Status: None   Collection Time: 05/07/14  9:30 PM  Result Value Ref Range Status   MRSA by PCR NEGATIVE NEGATIVE Final    Comment:        The GeneXpert MRSA Assay (FDA approved for NASAL specimens only), is one component of a comprehensive MRSA colonization surveillance program. It is not intended to diagnose MRSA infection nor to guide or monitor treatment for MRSA infections.   Culture, routine-abscess     Status: None (Preliminary result)   Collection Time: 05/15/14  1:24 PM  Result Value Ref Range Status   Specimen Description ABSCESS BACK  Final   Special Requests L2 THRU 3 DISC  Final   Gram Stain   Final    RARE WBC PRESENT,BOTH PMN AND MONONUCLEAR NO SQUAMOUS EPITHELIAL CELLS SEEN NO ORGANISMS SEEN Performed at [facility]    Culture   Final    NO GROWTH 1 DAY Performed at [facility]    Report Status PENDING  Incomplete     Studies:  Recent x-ray studies have been reviewed in detail by the Attending Physician  Scheduled Meds:  Scheduled Meds: . aspirin EC  81 mg Oral Daily  . baclofen  10 mg Oral BID  . buPROPion  150 mg Oral Daily  . cefTRIAXone (ROCEPHIN)  IV  2 g Intravenous Q24H  . ezetimibe  10 mg Oral Daily  . gabapentin  400 mg Oral TID  . Gerhardt's butt cream   Topical Daily  . levothyroxine  200 mcg Oral QAC breakfast  . levothyroxine  25 mcg Oral QAC breakfast  . mupirocin cream   Topical BID  . QUEtiapine  75 mg Oral QHS  . vancomycin  1,250 mg Intravenous Q12H  . vancomycin  125 mg Oral 4 times per day    Time spent on care of this patient: 30 mins   [facility] Office  [phone] Pager - [phone]  On-Call/Text Page:      [email or web address]      password [email or web address]  If 7PM-7AM, please contact  night-coverage [email or web address] Password [email or web address] 05/16/2014, 12:45 PM   LOS: 9 days

## 2014-05-16 NOTE — Clinical Social Work Note (Signed)
CSW continuing to monitor patient's progress and will assist with discharge back to [facility] skilled nursing facility when ready.  Nathan Moctezuma Givens, MSW, LCSW Licensed Clinical Social Worker [place] [phone]

## 2014-05-17 LAB — CBC
HEMATOCRIT: 32.5 % — AB (ref 36.0–46.0)
Hemoglobin: 10 g/dL — ABNORMAL LOW (ref 12.0–15.0)
MCH: 26.1 pg (ref 26.0–34.0)
MCHC: 30.8 g/dL (ref 30.0–36.0)
MCV: 84.9 fL (ref 78.0–100.0)
Platelets: 348 10*3/uL (ref 150–400)
RBC: 3.83 MIL/uL — AB (ref 3.87–5.11)
RDW: 17 % — ABNORMAL HIGH (ref 11.5–15.5)
WBC: 11.6 10*3/uL — ABNORMAL HIGH (ref 4.0–10.5)

## 2014-05-17 LAB — COMPREHENSIVE METABOLIC PANEL
ALK PHOS: 92 U/L (ref 39–117)
ALT: 18 U/L (ref 0–35)
AST: 36 U/L (ref 0–37)
Albumin: 2.8 g/dL — ABNORMAL LOW (ref 3.5–5.2)
Anion gap: 6 (ref 5–15)
BILIRUBIN TOTAL: 0.5 mg/dL (ref 0.3–1.2)
BUN: 9 mg/dL (ref 6–23)
CHLORIDE: 103 mmol/L (ref 96–112)
CO2: 25 mmol/L (ref 19–32)
Calcium: 9.2 mg/dL (ref 8.4–10.5)
Creatinine, Ser: 0.47 mg/dL — ABNORMAL LOW (ref 0.50–1.10)
GFR calc Af Amer: >90 mL/min (ref >90)
GFR calc non Af Amer: >90 mL/min (ref >90)
Glucose, Bld: 128 mg/dL — ABNORMAL HIGH (ref 70–99)
Potassium: 4.6 mmol/L (ref 3.5–5.1)
Sodium: 134 mmol/L — ABNORMAL LOW (ref 135–145)
TOTAL PROTEIN: 6.9 g/dL (ref 6.0–8.3)

## 2014-05-17 MED ORDER — SODIUM CHLORIDE 0.9 % IV SOLN: INTRAVENOUS | Status: DC

## 2014-05-17 MED ORDER — FLUTICASONE PROPIONATE 50 MCG/ACT NA SUSP
1.0000 | Freq: Every day | NASAL | Status: DC
  Administered 2014-05-17 – 2014-05-20 (×4): 1 via NASAL
  Filled 2014-05-17: qty 16

## 2014-05-17 NOTE — Progress Notes (Signed)
[place] TEAM 1 - Stepdown/ICU TEAM Progress Note  DARRIONA DEHAAS BTD:[id number] DOB: 12-Sep-1945 DOA: 05/07/2014 PCP: [name], MD  Admit HPI / Brief Narrative: 69 year old WF PMHx ; Bipolar 1 disorder, anxiety, chronic pain syndrome, Paraplegic spinal paralysis; Hypertension; Thyroid disease; Diabetes mellitus without complication; Obesity; Pyelonephritis; COPD with asthma; and GERD (gastroesophageal reflux disease). Patient was brought from [place] home after she complained of jerking tremors to both upper extremities for past 3 days. In the ED patient was found to be hypotensive with blood pressure 82/52, also patient found to have abnormal UA and complained of auditory hallucinations. Patient started on vancomycin and Zosyn for sepsis due to UTI. Lactic acid was normal with 1.55 and repeat lactic acid 0.76. Patient did have high white count of 22.1. She complains of diarrhea for past few days. Denies nausea or vomiting. Patient is alert and oriented 3, able to answer all the questions she is mentating very well despite the low blood pressure. Abdominal x-ray showed advanced destructive changes in the L1-L2 level, with recommendation for MRI for possible osteomyleitis/discitis for further evaluation. Patient has chronic Foley catheter, she was diagnosed with UTI recently on January 27.   HPI/Subjective: Pain under control, really no complaints, eyes still itching, requesting Flonase  Assessment/Plan: Bipolar 1 disorder -Continue Seroquel 75 mg  QHS   Anxiety -Continue Lorazepam 0.5 mg q 4 hr PRN   Chronic pain syndrome (primary team should only adjust pain medication) Continue current pain regimen of oxycodone to 5 mg every 4 hours as needed   continue Neurontin 500 mg TID -Continue Zanaflex 2 mg q 6hr PRN and baclofen Pain is stable  Sepsis unspecified organism -Sepsis physiology resolved -Continue to monitor CBC, CMP, magnesium  Destructive changes at L1-L2 on  x-ray/osteomyelitis/spinal abscess -X-ray of the abdomen showed destructive changes of L1-L2,  - MRI of the lumbar spine possible abscess/osteitis/discitis.  Antibiotics restarted 2/19 Seen by neurosurgery, Dr.Neelesh Nundkumar (neurosurgery)  she status post IR guided aspiration on 2/18 Pending culture results , AFB  Day #2 IV vancomycin and ceftriaxone pending final culture results Infectious disease following   Acute kidney injury(baseline creatinine is 0.71) -Patient Creatinine now within normal limits.     Submucosal nasal cyst Probably intranasal folliculitis Continue Bactroban Also start the patient on Flonase  Hypothyroidism -TSH within normal limit -Continue Synthroid 225 g daily  HTN -Controlled without BP medication   C. difficile colitis -Positive for C. Difficile.  Continue on oral vancomycin per ID, day #5  Paraplegia -Patient has paraplegia, has chronic Foley catheter in place;    Code Status: FULL Family Communication: no family present at time of exam Disposition Plan:  Pending results the patient's culture    Consultants: Dr.Cornelius N Tommy Medal (ID) Neurosurgery, Dr. Kathyrn Sheriff Interventional radiology    Procedure/Significant Events: 05/14/2009 MRI T-spine/L-spine with and without contrast;Large extramedullary fluid collection in the thoracic spine from T4- T11. This is compressing the cord. -marked cord atrophy especially at T7-8 and below. Could be related to chronic hematoma as some of the signal characteristics suggests layering blood.  -non cystic mass within the spinal canal compressing the cord at the T7 level could be hematoma.  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 -Extensive enhancement large fluid collection between L2-L3 vertebral bodies extending into the facet joints bilaterally, concerning for osteomyelitis and abscess,    Culture 2/10 blood left antecubital/arm NGTD 2/10 urine multiple bacterial  morphotypes 2/10 C. difficile positive by PCR, (ID believes colonization)  Antibiotics: Zosyn 2/10>> stopped 2/12 Vancomycin 2/10>> stopped 2/12, restarted on 2/16  DVT prophylaxis: Discontinue Lovenox 2/16 for procedure on Wednesday by IR SCD until procedure is complete and then restart Lovenox  Devices    LINES / TUBES:      Continuous Infusions: . sodium chloride 10 mL/hr at 05/12/14 2000    Objective: VITAL SIGNS: Temp: 99.6 F (37.6 C) (02/20 1034) Temp Source: Oral (02/20 1034) BP: 118/67 mmHg (02/20 1034) Pulse Rate: 92 (02/20 1034) SPO2; FIO2:   Intake/Output Summary (Last 24 hours) at 05/17/14 1207 Last data filed at 05/17/14 0909  Gross per 24 hour  Intake    480 ml  Output    750 ml  Net   -270 ml     Exam: General: A/O 4, NAD, No acute respiratory distress Lungs: Clear to auscultation bilaterally without wheezes or crackles Cardiovascular: Regular rate and rhythm without murmur gallop or rub normal S1 and S2 Abdomen: Morbidly Obese, Nontender, nondistended, soft, bowel sounds positive, no rebound, no ascites, no appreciable mass Extremities: No significant cyanosis, positive clubbing, positive 2+  bilateral lower extremities edema     Data Reviewed: Basic Metabolic Panel:  Recent Labs Lab 05/12/14 0245 05/13/14 0305 05/17/14 0547  NA 137 137 134*  K 3.3* 3.8 4.6  CL 106 105 103  CO2 25 26 25   GLUCOSE 110* 104* 128*  BUN <5* 5* 9  CREATININE 0.31* 0.46* 0.47*  CALCIUM 8.0* 8.1* 9.2  MG  --  1.6  --    Liver Function Tests:  Recent Labs Lab 05/13/14 0305 05/17/14 0547  AST 18 36  ALT 13 18  ALKPHOS 99 92  BILITOT 0.4 0.5  PROT 6.0 6.9  ALBUMIN 2.3* 2.8*   No results for input(s): LIPASE, AMYLASE in the last 168 hours. No results for input(s): AMMONIA in the last 168 hours. CBC:  Recent Labs Lab 05/13/14 0305 05/17/14 0547  WBC 8.4 11.6*  HGB 9.3* 10.0*  HCT 29.7* 32.5*  MCV 83.4 84.9  PLT 268 348   Cardiac  Enzymes: No results for input(s): CKTOTAL, CKMB, CKMBINDEX, TROPONINI in the last 168 hours. BNP (last 3 results)  Recent Labs  03/29/14 0429  BNP 73.0    ProBNP (last 3 results) No results for input(s): PROBNP in the last 8760 hours.  CBG: No results for input(s): GLUCAP in the last 168 hours.  Recent Results (from the past 240 hour(s))  Culture, blood (routine x 2)     Status: None   Collection Time: 05/07/14  3:30 PM  Result Value Ref Range Status   Specimen Description BLOOD LEFT ANTECUBITAL  Final   Special Requests BOTTLES DRAWN AEROBIC AND ANAEROBIC 6CC  Final   Culture NO GROWTH 7 DAYS  Final   Report Status 05/14/2014 FINAL  Final  Culture, blood (routine x 2)     Status: None   Collection Time: 05/07/14  3:45 PM  Result Value Ref Range Status   Specimen Description BLOOD LEFT ARM DRAWN BY RN DMA  Final   Special Requests BOTTLES DRAWN AEROBIC AND ANAEROBIC 6CC  Final   Culture NO GROWTH 7 DAYS  Final   Report Status 05/14/2014 FINAL  Final  Urine culture     Status: None   Collection Time: 05/07/14  4:38 PM  Result Value Ref Range Status   Specimen Description URINE, CATHETERIZED  Final   Special Requests NONE  Final   Colony Count   Final    70,000 COLONIES/ML Performed  at [facility]   Final    Multiple bacterial morphotypes present, none predominant. Suggest appropriate recollection if clinically indicated. Performed at [facility]    Report Status 05/09/2014 FINAL  Final  Clostridium Difficile by PCR     Status: Abnormal   Collection Time: 05/07/14  9:30 PM  Result Value Ref Range Status   C difficile by pcr POSITIVE (A) NEGATIVE Final    Comment: RESULT CALLED TO, READ BACK BY AND VERIFIED WITH: KEITH A AT 0154 ON [id number] BY FORSYTH K   MRSA PCR Screening     Status: None   Collection Time: 05/07/14  9:30 PM  Result Value Ref Range Status   MRSA by PCR NEGATIVE NEGATIVE Final    Comment:        The GeneXpert MRSA  Assay (FDA approved for NASAL specimens only), is one component of a comprehensive MRSA colonization surveillance program. It is not intended to diagnose MRSA infection nor to guide or monitor treatment for MRSA infections.   AFB culture with smear     Status: None (Preliminary result)   Collection Time: 05/15/14  1:22 PM  Result Value Ref Range Status   Specimen Description ABSCESS BACK  Final   Special Requests L2 THRU 3 DISC  Final   Acid Fast Smear   Final    NO ACID FAST BACILLI SEEN Performed at [facility]    Culture   Final    CULTURE WILL BE EXAMINED FOR 6 WEEKS BEFORE ISSUING A FINAL REPORT Performed at [facility]    Report Status PENDING  Incomplete  Culture, routine-abscess     Status: None (Preliminary result)   Collection Time: 05/15/14  1:24 PM  Result Value Ref Range Status   Specimen Description ABSCESS BACK  Final   Special Requests L2 THRU 3 DISC  Final   Gram Stain   Final    RARE WBC PRESENT,BOTH PMN AND MONONUCLEAR NO SQUAMOUS EPITHELIAL CELLS SEEN NO ORGANISMS SEEN Performed at [facility]    Culture   Final    NO GROWTH 2 DAYS Performed at [facility]    Report Status PENDING  Incomplete     Studies:  Recent x-ray studies have been reviewed in detail by the Attending Physician  Scheduled Meds:  Scheduled Meds: . aspirin EC  81 mg Oral Daily  . baclofen  10 mg Oral BID  . buPROPion  150 mg Oral Daily  . cefTRIAXone (ROCEPHIN)  IV  2 g Intravenous Q24H  . ezetimibe  10 mg Oral Daily  . gabapentin  400 mg Oral TID  . Gerhardt's butt cream   Topical Daily  . levothyroxine  200 mcg Oral QAC breakfast  . levothyroxine  25 mcg Oral QAC breakfast  . mupirocin cream   Topical BID  . QUEtiapine  75 mg Oral QHS  . sodium chloride  10-40 mL Intracatheter Q12H  . vancomycin  1,250 mg Intravenous Q12H  . vancomycin  125 mg Oral 4 times per day    Time spent on care of this patient: 30  mins   [facility] Office  [phone] Pager - [phone]  On-Call/Text Page:      [email or web address]      password [email or web address]  If 7PM-7AM, please contact night-coverage [email or web address] Password [email or web address] 05/17/2014, 12:07 PM   LOS: 10 days

## 2014-05-17 NOTE — Progress Notes (Signed)
INFECTIOUS DISEASE PROGRESS NOTE  ID: [name] is a 69 y.o. female with  Active Problems:   Paraplegic spinal paralysis   Thyroid disease   Sepsis   Diarrhea   Hypothyroid   ARF (acute renal failure)   Enteritis due to Clostridium difficile   UTI (urinary tract infection)   Chronic indwelling Foley catheter   Osteomyelitis   Discitis of thoracolumbar region   Anxiety state   Chronic pain syndrome   Sepsis due to Candida species   Spondylarthrosis   Acute renal failure syndrome   Other specified hypothyroidism   Essential hypertension   Paraplegia   Abscess in epidural space of lumbar spine   Blood poisoning   Discitis of lumbar region  Subjective: Feels her regular self, would like to go home on 2-22  Abtx:  Anti-infectives    Start     Dose/Rate Route Frequency Ordered Stop   05/16/14 1230  vancomycin (VANCOCIN) 1,250 mg in sodium chloride 0.9 % 250 mL IVPB     1,250 mg 166.7 mL/hr over 90 Minutes Intravenous Every 12 hours 05/16/14 1125     05/16/14 1200  cefTRIAXone (ROCEPHIN) 2 g in dextrose 5 % 50 mL IVPB - Premix     2 g 100 mL/hr over 30 Minutes Intravenous Every 24 hours 05/16/14 1040     05/13/14 1200  vancomycin (VANCOCIN) 50 mg/mL oral solution 125 mg     125 mg Oral 4 times per day 05/13/14 1010     05/08/14 1000  vancomycin (VANCOCIN) 1,250 mg in sodium chloride 0.9 % 250 mL IVPB  Status:  Discontinued     1,250 mg 166.7 mL/hr over 90 Minutes Intravenous Every 24 hours 05/08/14 0043 05/08/14 0813   05/08/14 1000  vancomycin (VANCOCIN) 1,250 mg in sodium chloride 0.9 % 250 mL IVPB  Status:  Discontinued     1,250 mg 166.7 mL/hr over 90 Minutes Intravenous Every 12 hours 05/08/14 0813 05/09/14 1526   05/08/14 1000  vancomycin (VANCOCIN) 50 mg/mL oral solution 125 mg  Status:  Discontinued     125 mg Oral 4 times daily 05/08/14 0942 05/09/14 1526   05/08/14 0100  piperacillin-tazobactam (ZOSYN) IVPB 3.375 g  Status:  Discontinued     3.375 g 12.5  mL/hr over 240 Minutes Intravenous Every 8 hours 05/08/14 0033 05/09/14 1526   05/07/14 1530  piperacillin-tazobactam (ZOSYN) IVPB 3.375 g     3.375 g 100 mL/hr over 30 Minutes Intravenous  Once 05/07/14 1519 05/07/14 1620   05/07/14 1530  vancomycin (VANCOCIN) IVPB 1000 mg/200 mL premix     1,000 mg 200 mL/hr over 60 Minutes Intravenous  Once 05/07/14 1519 05/07/14 1815      Medications:  Scheduled: . aspirin EC  81 mg Oral Daily  . baclofen  10 mg Oral BID  . buPROPion  150 mg Oral Daily  . cefTRIAXone (ROCEPHIN)  IV  2 g Intravenous Q24H  . ezetimibe  10 mg Oral Daily  . gabapentin  400 mg Oral TID  . Gerhardt's butt cream   Topical Daily  . levothyroxine  200 mcg Oral QAC breakfast  . levothyroxine  25 mcg Oral QAC breakfast  . mupirocin cream   Topical BID  . QUEtiapine  75 mg Oral QHS  . sodium chloride  10-40 mL Intracatheter Q12H  . vancomycin  1,250 mg Intravenous Q12H  . vancomycin  125 mg Oral 4 times per day    Objective: Vital signs in last 24  hours: Temp:  [98.1 F (36.7 C)-99.6 F (37.6 C)] 99.6 F (37.6 C) (02/20 1034) Pulse Rate:  [68-94] 92 (02/20 1034) Resp:  [17-19] 18 (02/20 0508) BP: (118-135)/(63-71) 118/67 mmHg (02/20 1034) SpO2:  [98 %-99 %] 99 % (02/20 1034) Weight:  [100.9 kg (222 lb 7.1 oz)] 100.9 kg (222 lb 7.1 oz) (02/19 2213)   General appearance: alert, cooperative and no distress Resp: clear to auscultation bilaterally Cardio: regular rate and rhythm GI: normal findings: bowel sounds normal and soft, non-tender  Lab Results  Recent Labs  05/17/14 0547  WBC 11.6*  HGB 10.0*  HCT 32.5*  NA 134*  K 4.6  CL 103  CO2 25  BUN 9  CREATININE 0.47*   Liver Panel  Recent Labs  05/17/14 0547  PROT 6.9  ALBUMIN 2.8*  AST 36  ALT 18  ALKPHOS 92  BILITOT 0.5   Sedimentation Rate No results for input(s): ESRSEDRATE in the last 72 hours. C-Reactive Protein No results for input(s): CRP in the last 72  hours.  Microbiology: Recent Results (from the past 240 hour(s))  Culture, blood (routine x 2)     Status: None   Collection Time: 05/07/14  3:30 PM  Result Value Ref Range Status   Specimen Description BLOOD LEFT ANTECUBITAL  Final   Special Requests BOTTLES DRAWN AEROBIC AND ANAEROBIC 6CC  Final   Culture NO GROWTH 7 DAYS  Final   Report Status 05/14/2014 FINAL  Final  Culture, blood (routine x 2)     Status: None   Collection Time: 05/07/14  3:45 PM  Result Value Ref Range Status   Specimen Description BLOOD LEFT ARM DRAWN BY RN DMA  Final   Special Requests BOTTLES DRAWN AEROBIC AND ANAEROBIC 6CC  Final   Culture NO GROWTH 7 DAYS  Final   Report Status 05/14/2014 FINAL  Final  Urine culture     Status: None   Collection Time: 05/07/14  4:38 PM  Result Value Ref Range Status   Specimen Description URINE, CATHETERIZED  Final   Special Requests NONE  Final   Colony Count   Final    70,000 COLONIES/ML Performed at [facility]    Culture   Final    Multiple bacterial morphotypes present, none predominant. Suggest appropriate recollection if clinically indicated. Performed at [facility]    Report Status 05/09/2014 FINAL  Final  Clostridium Difficile by PCR     Status: Abnormal   Collection Time: 05/07/14  9:30 PM  Result Value Ref Range Status   C difficile by pcr POSITIVE (A) NEGATIVE Final    Comment: RESULT CALLED TO, READ BACK BY AND VERIFIED WITH: KEITH A AT 0154 ON [id number] BY FORSYTH K   MRSA PCR Screening     Status: None   Collection Time: 05/07/14  9:30 PM  Result Value Ref Range Status   MRSA by PCR NEGATIVE NEGATIVE Final    Comment:        The GeneXpert MRSA Assay (FDA approved for NASAL specimens only), is one component of a comprehensive MRSA colonization surveillance program. It is not intended to diagnose MRSA infection nor to guide or monitor treatment for MRSA infections.   AFB culture with smear     Status: None (Preliminary  result)   Collection Time: 05/15/14  1:22 PM  Result Value Ref Range Status   Specimen Description ABSCESS BACK  Final   Special Requests L2 THRU 3 DISC  Final   Acid Fast  Smear   Final    NO ACID FAST BACILLI SEEN Performed at [facility]    Culture   Final    CULTURE WILL BE EXAMINED FOR 6 WEEKS BEFORE ISSUING A FINAL REPORT Performed at [facility]    Report Status PENDING  Incomplete  Culture, routine-abscess     Status: None (Preliminary result)   Collection Time: 05/15/14  1:24 PM  Result Value Ref Range Status   Specimen Description ABSCESS BACK  Final   Special Requests L2 THRU 3 DISC  Final   Gram Stain   Final    RARE WBC PRESENT,BOTH PMN AND MONONUCLEAR NO SQUAMOUS EPITHELIAL CELLS SEEN NO ORGANISMS SEEN Performed at [facility]    Culture   Final    NO GROWTH 2 DAYS Performed at [facility]    Report Status PENDING  Incomplete    Studies/Results: Ir Fluoro Guide Ndl Plmt / Bx  05/16/2014   CLINICAL DATA:  Lumbar L2-L3 diskitis.  EXAM: IR FLUORO GUIDE NEEDLE PLACEMENT /BIOPSY  ANESTHESIA/SEDATION: Conscious sedation.  MEDICATIONS: Versed 1 mg IV.  Fentanyl 25 mcg IV.  CONTRAST:  None.  PROCEDURE: Following a full explanation the procedure along with the potential associated complications, an informed witnessed consent was obtained.  The patient was laid prone on the fluoroscopic table. The skin overlying the lumbar region was then prepped and draped in the usual sterile fashion.  The skin entry site overlying the right paraspinous region at L2-L3 was then infiltrated with 0.25% bupivacaine.  Using biplane intermittent fluoroscopy, a 21 gauge Franseen needle was advanced to the disc space at L2-L3.  Using a 20 mL syringe, blood-tinged thick aspirate was obtained and sent for microbiologic analysis.  A second pass was also made again using a new Franseen 21 gauge needle. Again after having confirmed location at a different site in the  disc space, more blood tinged like fluid was obtained and sent for microbiologic analysis.  The needle was removed. Hemostasis was achieved at the skin entry site. The patient tolerated the procedure well. Approximately 4.5 cc of total aspirate was sent for analysis.  COMPLICATIONS: None immediate.  FINDINGS: As above.  IMPRESSION: Status post fluoroscopic-guided needle placement at L2-L3 disc space with aspiration for discitis.   Electronically Signed   By: Luanne Bras M.D.   On: 05/15/2014 14:08     Assessment/Plan: L2-3 Fluid collection and destructive process T4-11 fluid collection C diff (2-10) Paraplegia  Total days of antibiotics: 5 po vanco 2 ceftriaxone/vanco IV  Continue her current anbx Await her Cx's Prolonged po vanco taper?         Bobby Rumpf Infectious Diseases (pager) [phone] [email or web address] 05/17/2014, 10:58 AM  LOS: 10 days

## 2014-05-18 LAB — CULTURE, ROUTINE-ABSCESS: CULTURE: NO GROWTH

## 2014-05-18 MED ORDER — LORATADINE 10 MG PO TABS
10.0000 mg | ORAL_TABLET | Freq: Every day | ORAL | Status: DC
  Administered 2014-05-18 – 2014-05-20 (×3): 10 mg via ORAL
  Filled 2014-05-18 (×3): qty 1

## 2014-05-18 MED ORDER — CARVEDILOL 12.5 MG PO TABS
12.5000 mg | ORAL_TABLET | Freq: Two times a day (BID) | ORAL | Status: DC
  Administered 2014-05-18 – 2014-05-20 (×5): 12.5 mg via ORAL
  Filled 2014-05-18 (×7): qty 1

## 2014-05-18 MED ORDER — FENTANYL 25 MCG/HR TD PT72: 25.0000 ug | MEDICATED_PATCH | TRANSDERMAL | Status: DC

## 2014-05-18 MED ORDER — RISAQUAD PO CAPS
1.0000 | ORAL_CAPSULE | Freq: Every day | ORAL | Status: DC
  Administered 2014-05-18 – 2014-05-20 (×3): 1 via ORAL
  Filled 2014-05-18 (×4): qty 1

## 2014-05-18 MED ORDER — COLCHICINE 0.6 MG PO TABS
0.6000 mg | ORAL_TABLET | Freq: Two times a day (BID) | ORAL | Status: DC
  Administered 2014-05-18 – 2014-05-20 (×5): 0.6 mg via ORAL
  Filled 2014-05-18 (×6): qty 1

## 2014-05-18 MED ORDER — MONTELUKAST SODIUM 10 MG PO TABS
10.0000 mg | ORAL_TABLET | Freq: Every day | ORAL | Status: DC
  Administered 2014-05-18 – 2014-05-20 (×3): 10 mg via ORAL
  Filled 2014-05-18 (×3): qty 1

## 2014-05-18 MED ORDER — SENNOSIDES-DOCUSATE SODIUM 8.6-50 MG PO TABS
1.0000 | ORAL_TABLET | Freq: Every day | ORAL | Status: DC
  Administered 2014-05-19: 1 via ORAL
  Filled 2014-05-18 (×3): qty 1

## 2014-05-18 NOTE — Progress Notes (Addendum)
[place] TEAM 1 - Stepdown/ICU TEAM Progress Note  [name] JEH:[id number] DOB: 12-Nov-1945 DOA: 05/07/2014 PCP: [name], MD  Admit HPI / Brief Narrative: 69 year old WF PMHx ; Bipolar 1 disorder, anxiety, chronic pain syndrome, Paraplegic spinal paralysis; Hypertension; Thyroid disease; Diabetes mellitus without complication; Obesity; Pyelonephritis; COPD with asthma; and GERD (gastroesophageal reflux disease). Patient was brought from [facility] home after she complained of jerking tremors to both upper extremities for past 3 days. In the ED patient was found to be hypotensive with blood pressure 82/52, also patient found to have abnormal UA and complained of auditory hallucinations. Patient started on vancomycin and Zosyn for sepsis due to UTI. Lactic acid was normal with 1.55 and repeat lactic acid 0.76. Patient did have high white count of 22.1. She complains of diarrhea for past few days. Denies nausea or vomiting. Patient is alert and oriented 3, able to answer all the questions she is mentating very well despite the low blood pressure. Abdominal x-ray showed advanced destructive changes in the L1-L2 level, with recommendation for MRI for possible osteomyleitis/discitis for further evaluation. Patient has chronic Foley catheter, she was diagnosed with UTI recently on January 27.   HPI/Subjective: Pain under control, complaining of pain in her right hand, right knuckle  Assessment/Plan: Bipolar 1 disorder -Continue Seroquel 75 mg  QHS   Anxiety -Continue Lorazepam 0.5 mg q 4 hr PRN   Chronic pain syndrome (primary team should only adjust pain medication) Continue current pain regimen of oxycodone to 5 mg every 4 hours as needed   continue Neurontin 500 mg TID -Continue Zanaflex 2 mg q 6hr PRN and baclofen Patient restarted on fentanyl patch   Sepsis unspecified organism -Sepsis physiology resolved Culture still pending   Destructive changes at L1-L2 on  x-ray/osteomyelitis/spinal abscess -X-ray of the abdomen showed destructive changes of L1-L2,  - MRI of the lumbar spine possible abscess/osteitis/discitis.  Antibiotics restarted 2/19 Seen by neurosurgery, Dr.Neelesh Nundkumar (neurosurgery)  she status post IR guided aspiration on 2/18 Pending culture results , AFB  Day #3 IV vancomycin and ceftriaxone pending final culture results Infectious disease following   Acute kidney injury(baseline creatinine is 0.71) -Patient Creatinine now within normal limits.     Submucosal nasal cyst Probably intranasal folliculitis Continue Bactroban Also start the patient on Flonase Restarted Claritin and Singulair  Acute gout flair Restarted on call to see twice a day   Hypothyroidism -TSH within normal limit -Continue Synthroid 225 g daily  HTN -Controlled without BP medication   C. difficile colitis -Positive for C. Difficile.  Continue on oral vancomycin per ID, day #6 Diarrhea improving   Paraplegia -Patient has paraplegia, has chronic Foley catheter in place;    Code Status: FULL Family Communication: no family present at time of exam Disposition Plan:  Pending results the patient's culture    Consultants: Dr.Cornelius N Tommy Medal (ID) Neurosurgery, Dr. Kathyrn Sheriff Interventional radiology    Procedure/Significant Events: 05/14/2009 MRI T-spine/L-spine with and without contrast;Large extramedullary fluid collection in the thoracic spine from T4- T11. This is compressing the cord. -marked cord atrophy especially at T7-8 and below. Could be related to chronic hematoma as some of the signal characteristics suggests layering blood.  -non cystic mass within the spinal canal compressing the cord at the T7 level could be hematoma.  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 -Extensive enhancement large fluid collection between L2-L3 vertebral bodies extending into the facet joints bilaterally, concerning for  osteomyelitis and abscess,  Culture 2/10 blood left antecubital/arm NGTD 2/10 urine multiple bacterial morphotypes 2/10 C. difficile positive by PCR, (ID believes colonization)   Antibiotics: Zosyn 2/10>> stopped 2/12 Vancomycin 2/10>> stopped 2/12, restarted on 2/16  DVT prophylaxis: Discontinue Lovenox 2/16 for procedure on Wednesday by IR SCD until procedure is complete and then restart Lovenox  Devices    LINES / TUBES:      Continuous Infusions: . sodium chloride 10 mL/hr at 05/18/14 0650    Objective: VITAL SIGNS: Temp: 98.8 F (37.1 C) (02/21 0937) Temp Source: Oral (02/21 0937) BP: 119/57 mmHg (02/21 0937) Pulse Rate: 109 (02/21 0937) SPO2; FIO2:   Intake/Output Summary (Last 24 hours) at 05/18/14 1203 Last data filed at 05/18/14 0940  Gross per 24 hour  Intake    800 ml  Output   2250 ml  Net  -1450 ml     Exam: General: A/O 4, NAD, No acute respiratory distress Lungs: Clear to auscultation bilaterally without wheezes or crackles Cardiovascular: Regular rate and rhythm without murmur gallop or rub normal S1 and S2 Abdomen: Morbidly Obese, Nontender, nondistended, soft, bowel sounds positive, no rebound, no ascites, no appreciable mass Extremities: No significant cyanosis, positive clubbing, positive 2+  bilateral lower extremities edema     Data Reviewed: Basic Metabolic Panel:  Recent Labs Lab 05/12/14 0245 05/13/14 0305 05/17/14 0547  NA 137 137 134*  K 3.3* 3.8 4.6  CL 106 105 103  CO2 25 26 25   GLUCOSE 110* 104* 128*  BUN <5* 5* 9  CREATININE 0.31* 0.46* 0.47*  CALCIUM 8.0* 8.1* 9.2  MG  --  1.6  --    Liver Function Tests:  Recent Labs Lab 05/13/14 0305 05/17/14 0547  AST 18 36  ALT 13 18  ALKPHOS 99 92  BILITOT 0.4 0.5  PROT 6.0 6.9  ALBUMIN 2.3* 2.8*   No results for input(s): LIPASE, AMYLASE in the last 168 hours. No results for input(s): AMMONIA in the last 168 hours. CBC:  Recent Labs Lab 05/13/14 0305  05/17/14 0547  WBC 8.4 11.6*  HGB 9.3* 10.0*  HCT 29.7* 32.5*  MCV 83.4 84.9  PLT 268 348   Cardiac Enzymes: No results for input(s): CKTOTAL, CKMB, CKMBINDEX, TROPONINI in the last 168 hours. BNP (last 3 results)  Recent Labs  03/29/14 0429  BNP 73.0    ProBNP (last 3 results) No results for input(s): PROBNP in the last 8760 hours.  CBG: No results for input(s): GLUCAP in the last 168 hours.  Recent Results (from the past 240 hour(s))  AFB culture with smear     Status: None (Preliminary result)   Collection Time: 05/15/14  1:22 PM  Result Value Ref Range Status   Specimen Description ABSCESS BACK  Final   Special Requests L2 THRU 3 DISC  Final   Acid Fast Smear   Final    NO ACID FAST BACILLI SEEN Performed at [facility]    Culture   Final    CULTURE WILL BE EXAMINED FOR 6 WEEKS BEFORE ISSUING A FINAL REPORT Performed at [facility]    Report Status PENDING  Incomplete  Culture, routine-abscess     Status: None   Collection Time: 05/15/14  1:24 PM  Result Value Ref Range Status   Specimen Description ABSCESS BACK  Final   Special Requests L2 THRU 3 DISC  Final   Gram Stain   Final    RARE WBC PRESENT,BOTH PMN AND MONONUCLEAR NO SQUAMOUS EPITHELIAL CELLS SEEN  NO ORGANISMS SEEN Performed at [facility]    Culture   Final    NO GROWTH 3 DAYS Performed at [facility]    Report Status 05/18/2014 FINAL  Final     Studies:  Recent x-ray studies have been reviewed in detail by the Attending Physician  Scheduled Meds:  Scheduled Meds: . acidophilus  1 capsule Oral Daily  . aspirin EC  81 mg Oral Daily  . baclofen  10 mg Oral BID  . buPROPion  150 mg Oral Daily  . carvedilol  12.5 mg Oral BID WC  . cefTRIAXone (ROCEPHIN)  IV  2 g Intravenous Q24H  . colchicine  0.6 mg Oral BID  . ezetimibe  10 mg Oral Daily  . fentaNYL  25 mcg Transdermal Q72H  . fluticasone  1 spray Each Nare Daily  . gabapentin  400 mg Oral TID    . Gerhardt's butt cream   Topical Daily  . levothyroxine  200 mcg Oral QAC breakfast  . levothyroxine  25 mcg Oral QAC breakfast  . loratadine  10 mg Oral Daily  . montelukast  10 mg Oral Daily  . mupirocin cream   Topical BID  . QUEtiapine  75 mg Oral QHS  . senna-docusate  1 tablet Oral QHS  . sodium chloride  10-40 mL Intracatheter Q12H  . vancomycin  1,250 mg Intravenous Q12H  . vancomycin  125 mg Oral 4 times per day    Time spent on care of this patient: 30 mins   [facility] [facility] Office  [phone] Pager - [phone]  On-Call/Text Page:      [email or web address]      password [email or web address]  If 7PM-7AM, please contact night-coverage [email or web address] Password [email or web address] 05/18/2014, 12:03 PM   LOS: 11 days

## 2014-05-18 NOTE — Plan of Care (Signed)
Problem: Phase I Progression Outcomes Goal: Pain controlled with appropriate interventions Outcome: Not Progressing Patient states she is having a gout flare up and having severe pain in her hands.

## 2014-05-18 NOTE — Progress Notes (Signed)
INFECTIOUS DISEASE PROGRESS NOTE  ID: [name] is a 69 y.o. female with  Active Problems:   Paraplegic spinal paralysis   Thyroid disease   Sepsis   Diarrhea   Hypothyroid   ARF (acute renal failure)   Enteritis due to Clostridium difficile   UTI (urinary tract infection)   Chronic indwelling Foley catheter   Osteomyelitis   Discitis of thoracolumbar region   Anxiety state   Chronic pain syndrome   Sepsis due to Candida species   Spondylarthrosis   Acute renal failure syndrome   Other specified hypothyroidism   Essential hypertension   Paraplegia   Abscess in epidural space of lumbar spine   Blood poisoning   Discitis of lumbar region  Subjective: 1 BM today, more formed per nursing.  Pt c/o rib pain and shoulder pain, would like her gout medicine.   Abtx:  Anti-infectives    Start     Dose/Rate Route Frequency Ordered Stop   05/16/14 1230  vancomycin (VANCOCIN) 1,250 mg in sodium chloride 0.9 % 250 mL IVPB     1,250 mg 166.7 mL/hr over 90 Minutes Intravenous Every 12 hours 05/16/14 1125     05/16/14 1200  cefTRIAXone (ROCEPHIN) 2 g in dextrose 5 % 50 mL IVPB - Premix     2 g 100 mL/hr over 30 Minutes Intravenous Every 24 hours 05/16/14 1040     05/13/14 1200  vancomycin (VANCOCIN) 50 mg/mL oral solution 125 mg     125 mg Oral 4 times per day 05/13/14 1010     05/08/14 1000  vancomycin (VANCOCIN) 1,250 mg in sodium chloride 0.9 % 250 mL IVPB  Status:  Discontinued     1,250 mg 166.7 mL/hr over 90 Minutes Intravenous Every 24 hours 05/08/14 0043 05/08/14 0813   05/08/14 1000  vancomycin (VANCOCIN) 1,250 mg in sodium chloride 0.9 % 250 mL IVPB  Status:  Discontinued     1,250 mg 166.7 mL/hr over 90 Minutes Intravenous Every 12 hours 05/08/14 0813 05/09/14 1526   05/08/14 1000  vancomycin (VANCOCIN) 50 mg/mL oral solution 125 mg  Status:  Discontinued     125 mg Oral 4 times daily 05/08/14 0942 05/09/14 1526   05/08/14 0100  piperacillin-tazobactam (ZOSYN)  IVPB 3.375 g  Status:  Discontinued     3.375 g 12.5 mL/hr over 240 Minutes Intravenous Every 8 hours 05/08/14 0033 05/09/14 1526   05/07/14 1530  piperacillin-tazobactam (ZOSYN) IVPB 3.375 g     3.375 g 100 mL/hr over 30 Minutes Intravenous  Once 05/07/14 1519 05/07/14 1620   05/07/14 1530  vancomycin (VANCOCIN) IVPB 1000 mg/200 mL premix     1,000 mg 200 mL/hr over 60 Minutes Intravenous  Once 05/07/14 1519 05/07/14 1815      Medications:  Scheduled: . acidophilus  1 capsule Oral Daily  . aspirin EC  81 mg Oral Daily  . baclofen  10 mg Oral BID  . buPROPion  150 mg Oral Daily  . carvedilol  12.5 mg Oral BID WC  . cefTRIAXone (ROCEPHIN)  IV  2 g Intravenous Q24H  . colchicine  0.6 mg Oral BID  . ezetimibe  10 mg Oral Daily  . fentaNYL  25 mcg Transdermal Q72H  . fluticasone  1 spray Each Nare Daily  . gabapentin  400 mg Oral TID  . Gerhardt's butt cream   Topical Daily  . levothyroxine  200 mcg Oral QAC breakfast  . levothyroxine  25 mcg Oral QAC breakfast  .  loratadine  10 mg Oral Daily  . montelukast  10 mg Oral Daily  . mupirocin cream   Topical BID  . QUEtiapine  75 mg Oral QHS  . senna-docusate  1 tablet Oral QHS  . sodium chloride  10-40 mL Intracatheter Q12H  . vancomycin  1,250 mg Intravenous Q12H  . vancomycin  125 mg Oral 4 times per day    Objective: Vital signs in last 24 hours: Temp:  [98.1 F (36.7 C)-99.6 F (37.6 C)] 98.8 F (37.1 C) (02/21 0937) Pulse Rate:  [92-109] 109 (02/21 0937) Resp:  [16-18] 17 (02/21 0937) BP: (114-129)/(57-76) 119/57 mmHg (02/21 0937) SpO2:  [9 %-99 %] 98 % (02/21 0937) Weight:  [98.793 kg (217 lb 12.8 oz)] 98.793 kg (217 lb 12.8 oz) (02/20 2038)   General appearance: alert, cooperative and no distress Resp: clear to auscultation bilaterally Cardio: regular rate and rhythm GI: normal findings: bowel sounds normal and soft, non-tender  Lab Results  Recent Labs  05/17/14 0547  WBC 11.6*  HGB 10.0*  HCT 32.5*  NA  134*  K 4.6  CL 103  CO2 25  BUN 9  CREATININE 0.47*   Liver Panel  Recent Labs  05/17/14 0547  PROT 6.9  ALBUMIN 2.8*  AST 36  ALT 18  ALKPHOS 92  BILITOT 0.5   Sedimentation Rate No results for input(s): ESRSEDRATE in the last 72 hours. C-Reactive Protein No results for input(s): CRP in the last 72 hours.  Microbiology: Recent Results (from the past 240 hour(s))  AFB culture with smear     Status: None (Preliminary result)   Collection Time: 05/15/14  1:22 PM  Result Value Ref Range Status   Specimen Description ABSCESS BACK  Final   Special Requests L2 THRU 3 DISC  Final   Acid Fast Smear   Final    NO ACID FAST BACILLI SEEN Performed at [facility]    Culture   Final    CULTURE WILL BE EXAMINED FOR 6 WEEKS BEFORE ISSUING A FINAL REPORT Performed at [facility]    Report Status PENDING  Incomplete  Culture, routine-abscess     Status: None (Preliminary result)   Collection Time: 05/15/14  1:24 PM  Result Value Ref Range Status   Specimen Description ABSCESS BACK  Final   Special Requests L2 THRU 3 DISC  Final   Gram Stain   Final    RARE WBC PRESENT,BOTH PMN AND MONONUCLEAR NO SQUAMOUS EPITHELIAL CELLS SEEN NO ORGANISMS SEEN Performed at [facility]    Culture   Final    NO GROWTH 2 DAYS Performed at [facility]    Report Status PENDING  Incomplete    Studies/Results: No results found.   Assessment/Plan: L2-3 Fluid collection and destructive process T4-11 fluid collection C diff (2-10) Paraplegia  Total days of antibiotic: 6 po vanco    3 vanco/ceftriaxone Fluid Cx is pending.  Sx of colitis improving.          Bobby Rumpf Infectious Diseases (pager) [phone] [email or web address] 05/18/2014, 10:32 AM  LOS: 11 days

## 2014-05-19 LAB — MAGNESIUM: Magnesium: 1.6 mg/dL (ref 1.5–2.5)

## 2014-05-19 MED ORDER — MAGNESIUM OXIDE 400 (241.3 MG) MG PO TABS
400.0000 mg | ORAL_TABLET | Freq: Every day | ORAL | Status: DC
  Administered 2014-05-19 – 2014-05-20 (×2): 400 mg via ORAL
  Filled 2014-05-19 (×2): qty 1

## 2014-05-19 NOTE — Progress Notes (Signed)
ANTIBIOTIC CONSULT NOTE - FOLLOW UP  Pharmacy Consult for Vancomycin Indication: osteomyelitis  Allergies  Allergen Reactions  . Codeine   . Propranolol Hcl     Patient Measurements: Height: 5\' 7"  (170.2 cm) Weight: 219 lb 14.9 oz (99.76 kg) IBW/kg (Calculated) : 61.6  Vital Signs: Temp: 98.8 F (37.1 C) (02/22 1059) Temp Source: Oral (02/22 1059) BP: 109/53 mmHg (02/22 1059) Pulse Rate: 90 (02/22 1059) Intake/Output from previous day: 02/21 0701 - 02/22 0700 In: 600 [P.O.:600] Out: 1300 [Urine:1300] Intake/Output from this shift: Total I/O In: 480 [P.O.:480] Out: -   Labs:  Recent Labs  05/17/14 0547  WBC 11.6*  HGB 10.0*  PLT 348  CREATININE 0.47*   Estimated Creatinine Clearance: 84 mL/min (by C-G formula based on Cr of 0.47).  Assessment:  Day # 4 Vancomycin and Ceftriaxone for discitis and epidural abscess, possible osteomyelitis.  Noted plan for at least 6 weeks of therapy.  To stay on oral Vancomycin for C diff, while on IV antibiotics.  Paraplegic spinal paralysis.  Low serum creatinine, last labs 05/17/14. Will need to confirm Vanc dosing via trough levels.   Goal of Therapy:  Vancomycin trough level 15-20 mcg/ml  Plan:   Continue Vancomycin 1250 mg IV q12hrs.  Also on Ceftriaxone 2 grams IV q24hrs and Vanc 125 mg oral solution PO qid.  Bmet and CBC in am.  Will need steady-state Vanc trough level later this week, and weekly while on IV Vancomycin.  Bmet and CBC at weekly as well.   Arty Baumgartner, [place] Pager: [phone] 05/19/2014,2:44 PM

## 2014-05-19 NOTE — Progress Notes (Signed)
Patient ID: [name], female   DOB: [birth year], 69 y.o.   MRN: [id number]         [facility] Infectious Disease    Date of Admission:  05/07/2014           Day 7 oral vancomycin        Day 4 IV vancomycin and ceftriaxone  Active Problems:   Paraplegic spinal paralysis   Thyroid disease   Sepsis   Diarrhea   Hypothyroid   ARF (acute renal failure)   Enteritis due to Clostridium difficile   UTI (urinary tract infection)   Chronic indwelling Foley catheter   Osteomyelitis   Discitis of thoracolumbar region   Anxiety state   Chronic pain syndrome   Sepsis due to Candida species   Spondylarthrosis   Acute renal failure syndrome   Other specified hypothyroidism   Essential hypertension   Paraplegia   Abscess in epidural space of lumbar spine   Blood poisoning   Discitis of lumbar region   . acidophilus  1 capsule Oral Daily  . aspirin EC  81 mg Oral Daily  . baclofen  10 mg Oral BID  . buPROPion  150 mg Oral Daily  . carvedilol  12.5 mg Oral BID WC  . cefTRIAXone (ROCEPHIN)  IV  2 g Intravenous Q24H  . colchicine  0.6 mg Oral BID  . ezetimibe  10 mg Oral Daily  . fentaNYL  25 mcg Transdermal Q72H  . fluticasone  1 spray Each Nare Daily  . gabapentin  400 mg Oral TID  . Gerhardt's butt cream   Topical Daily  . levothyroxine  200 mcg Oral QAC breakfast  . levothyroxine  25 mcg Oral QAC breakfast  . loratadine  10 mg Oral Daily  . montelukast  10 mg Oral Daily  . mupirocin cream   Topical BID  . QUEtiapine  75 mg Oral QHS  . senna-docusate  1 tablet Oral QHS  . sodium chloride  10-40 mL Intracatheter Q12H  . vancomycin  1,250 mg Intravenous Q12H  . vancomycin  125 mg Oral 4 times per day    Subjective: She is doing well today. She denies any pain.   Review of Systems: Pertinent items are noted in HPI.  Past Medical History  Diagnosis Date  . Paraplegic spinal paralysis   . Hypertension   . Bipolar 1 disorder   . Thyroid disease   .  Diabetes mellitus without complication   . Obesity   . Pyelonephritis   . COPD with asthma   . GERD (gastroesophageal reflux disease)     History  Substance Use Topics  . Smoking status: Former Research scientist (life sciences)  . Smokeless tobacco: Not on file  . Alcohol Use: No    Family History  Problem Relation Age of Onset  . Heart disease Mother    Allergies  Allergen Reactions  . Codeine   . Propranolol Hcl     OBJECTIVE: Blood pressure 109/53, pulse 90, temperature 98.8 F (37.1 C), temperature source Oral, resp. rate 16, height 5\' 7"  (1.702 m), weight 219 lb 14.9 oz (99.76 kg), SpO2 99 %. General: She is smiling and in better spirits today. She is very talkative. New right arm PICC in place Abdomen: Soft with quiet bowel sounds  Lumbar aspirate cultures remain negative at 72 hours  MRI THORACIC AND LUMBAR SPINE WITHOUT AND WITH CONTRAST 05/08/2014  IMPRESSION: Image quality is significantly degraded by motion in the thoracic and lumbar spine.  Large extramedullary fluid collection in the thoracic spine from T4 through T11. This is compressing the cord. There is marked cord atrophy especially at T7-8 and below. This fluid collection could be related to chronic hematoma as some of the signal characteristics suggests layering blood. There is also a non cystic mass within the spinal canal compressing the cord at the T7 level which could be hematoma. Calcified disc or epidural mass are other considerations.  The marked destructive process at L2-3 with severe spinal stenosis. There is extensive enhancement of a large fluid collection between the L2 and L3 vertebral bodies extending into the facet joints bilaterally, concerning for osteomyelitis and abscess, particularly given the patient's clinical findings.   Electronically Signed  By: Franchot Gallo M.D.  On: 05/08/2014 20:03  Assessment: Even with negative aspirate cultures she will need prolonged antibiotic therapy for  discitis and epidural abscess. She will also need to stay on oral vancomycin for C. difficile while she is on IV antibiotic therapy. She will probably need a follow-up MRI scan in 6-8 weeks  Plan: 1. Continue oral vancomycin while on IV antibiotics 2. Continue IV vancomycin and ceftriaxone for 6 weeks minimum 3. I will arrange follow-up in my clinic in about 6 weeks 4. I will follow-up on Wednesday, 05/21/2014 if she is still here  Michel Bickers, [place] [facility] [place] [phone] pager   [phone] cell 05/19/2014, 12:05 PM

## 2014-05-19 NOTE — Progress Notes (Signed)
CARE MANAGEMENT NOTE 05/19/2014  Patient:  [name], [name]   Account Number:  [id number]  Date Initiated:  05/08/2014  Documentation initiated by:  Jolene Provost  Subjective/Objective Assessment:   Admitted for sepsis. Pt is from [facility]. Pt plans to return to [facility] at discharge. CSW is aware and arrange for placement at discharge. No CM needs identified.     Action/Plan:   Anticipated DC Date:  05/16/2014   Anticipated DC Plan:  SKILLED NURSING FACILITY  In-house referral  Clinical Social Worker      DC Planning Services  CM consult      Choice offered to / List presented to:             Status of service:  Completed, signed off Medicare Important Message given?  YES (If response is "NO", the following Medicare IM given date fields will be blank) Date Medicare IM given:  05/12/2014 Medicare IM given by:  Marvetta Gibbons Date Additional Medicare IM given:  05/19/2014 Additional Medicare IM given by:  Jonnie Finner  Discharge Disposition:  [place]  Per UR Regulation:  Reviewed for med. necessity/level of care/duration of stay  If discussed at Home of Stay Meetings, dates discussed:   05/13/2014  05/15/2014    Comments:  05/19/2014 1300 Chart reviewed. dc to SNF when medically ready. CSW following for SNF placment. Jonnie Finner RN CCM Case Mgmt phone [phone]  05/12/14- 1500- Marvetta Gibbons RN, BSN [phone] Lumbar discitis osteomyelitis abscess: ID had DC her IV antibiotics in hope that IR could get aspirate for cultures awaiting IR vs Neurosurg for cultures  05/08/2014 1500 Jolene Provost, RN, MSN, CM

## 2014-05-19 NOTE — Progress Notes (Signed)
Pt had loose BM and refuses to be cleaned. Per pt, "I am very nauseous and I do not want to be moved." Education regarding skin breakdown provided. "I know I am lying in stool but I do not want to be cleaned at the moment." Pt understands teaching and continues to refuses. Will continue to monitor. Velora Mediate

## 2014-05-19 NOTE — Clinical Social Work Note (Signed)
Patient discussed in morning progression and per MD will discharge back to [facility] Tuesday, 2/23. Contact made with Jackelyn Poling, admissions director for [facility] and update provided.   [name], MSW, LCSW Licensed Clinical Social Worker [place] [phone]

## 2014-05-19 NOTE — Progress Notes (Signed)
[facility] TEAM 1 - Stepdown/ICU TEAM Progress Note  [name] IZT:[id number] DOB: October 29, 1945 DOA: 05/07/2014 PCP: [name], MD  Admit HPI / Brief Narrative: 69 year old WF PMHx ; Bipolar 1 disorder, anxiety, chronic pain syndrome, Paraplegic spinal paralysis; Hypertension; Thyroid disease; Diabetes mellitus without complication; Obesity; Pyelonephritis; COPD with asthma; and GERD (gastroesophageal reflux disease). Patient was brought from [facility] home after she complained of jerking tremors to both upper extremities for past 3 days. In the ED patient was found to be hypotensive with blood pressure 82/52, also patient found to have abnormal UA and complained of auditory hallucinations. Patient started on vancomycin and Zosyn for sepsis due to UTI. Lactic acid was normal with 1.55 and repeat lactic acid 0.76. Patient did have high white count of 22.1. She complains of diarrhea for past few days. Denies nausea or vomiting. Patient is alert and oriented 3, able to answer all the questions she is mentating very well despite the low blood pressure. Abdominal x-ray showed advanced destructive changes in the L1-L2 level, with recommendation for MRI for possible osteomyleitis/discitis for further evaluation. Patient has chronic Foley catheter, she was diagnosed with UTI recently on January 27.   HPI/Subjective: Improving symptoms of gout, right hand pain, patient still having some diarrhea  Assessment/Plan: Bipolar 1 disorder -Continue Seroquel 75 mg  QHS   Anxiety -Continue Lorazepam 0.5 mg q 4 hr PRN   Chronic pain syndrome (primary team should only adjust pain medication) Continue current pain regimen of oxycodone to 5 mg every 4 hours as needed   continue Neurontin 500 mg TID -Continue Zanaflex 2 mg q 6hr PRN and baclofen Continue fentanyl patch   Sepsis unspecified organism -Sepsis physiology resolved Culture still pending   Destructive changes at L1-L2 on  x-ray/osteomyelitis/spinal abscess -X-ray of the abdomen showed destructive changes of L1-L2,  - MRI of the lumbar spine possible abscess/osteitis/discitis.  Antibiotics restarted 2/19 Seen by neurosurgery, Dr.Neelesh Nundkumar (neurosurgery)  she status post IR guided aspiration on 2/18 Pending culture results , AFB  Day #4 IV vancomycin and ceftriaxone to be continued for another 6 weeks Patient would need repeat MRI in 6-8 weeks Follow-up with infectious disease in 6 weeks   Acute kidney injury(baseline creatinine is 0.71) Resolved    Submucosal nasal cyst Probably intranasal folliculitis Continue Bactroban Also start the patient on Flonase Restarted Claritin and Singulair  Acute gout flair Continue colchicine  twice a day   Hypothyroidism -TSH within normal limit -Continue Synthroid 225 g daily  HTN -Controlled without BP medication   C. difficile colitis -Positive for C. Difficile.  Continue on oral vancomycin per ID, day #7 She would need to be on by mouth vancomycin as long as she is on antibiotics Patient still has some diarrhea today  Paraplegia -Patient has paraplegia, has chronic Foley catheter in place;    Code Status: FULL Family Communication: no family present at time of exam Disposition Plan:  Plan is to discharge patient to SNF on 2/23    Consultants: Dr.Cornelius N Tommy Medal (ID) Neurosurgery, Dr. Kathyrn Sheriff Interventional radiology    Procedure/Significant Events: 05/14/2009 MRI T-spine/L-spine with and without contrast;Large extramedullary fluid collection in the thoracic spine from T4- T11. This is compressing the cord. -marked cord atrophy especially at T7-8 and below. Could be related to chronic hematoma as some of the signal characteristics suggests layering blood.  -non cystic mass within the spinal canal compressing the cord at the T7 level could be hematoma.  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 -Extensive  enhancement large fluid collection between L2-L3 vertebral bodies extending into the facet joints bilaterally, concerning for osteomyelitis and abscess,    Culture 2/10 blood left antecubital/arm NGTD 2/10 urine multiple bacterial morphotypes 2/10 C. difficile positive by PCR, (ID believes colonization)   Antibiotics: Zosyn 2/10>> stopped 2/12 Vancomycin 2/10>> stopped 2/12, restarted on 2/16  DVT prophylaxis: Discontinue Lovenox 2/16 for procedure on Wednesday by IR SCD until procedure is complete and then restart Lovenox  Devices    LINES / TUBES:      Continuous Infusions: . sodium chloride 10 mL/hr at 05/18/14 0650    Objective: VITAL SIGNS: Temp: 98.8 F (37.1 C) (02/22 1059) Temp Source: Oral (02/22 1059) BP: 109/53 mmHg (02/22 1059) Pulse Rate: 90 (02/22 1059) SPO2; FIO2:   Intake/Output Summary (Last 24 hours) at 05/19/14 1233 Last data filed at 05/19/14 0200  Gross per 24 hour  Intake    600 ml  Output   1300 ml  Net   -700 ml     Exam: General: A/O 4, NAD, No acute respiratory distress Lungs: Clear to auscultation bilaterally without wheezes or crackles Cardiovascular: Regular rate and rhythm without murmur gallop or rub normal S1 and S2 Abdomen: Morbidly Obese, Nontender, nondistended, soft, bowel sounds positive, no rebound, no ascites, no appreciable mass Extremities: No significant cyanosis, positive clubbing, positive 2+  bilateral lower extremities edema     Data Reviewed: Basic Metabolic Panel:  Recent Labs Lab 05/13/14 0305 05/17/14 0547  NA 137 134*  K 3.8 4.6  CL 105 103  CO2 26 25  GLUCOSE 104* 128*  BUN 5* 9  CREATININE 0.46* 0.47*  CALCIUM 8.1* 9.2  MG 1.6  --    Liver Function Tests:  Recent Labs Lab 05/13/14 0305 05/17/14 0547  AST 18 36  ALT 13 18  ALKPHOS 99 92  BILITOT 0.4 0.5  PROT 6.0 6.9  ALBUMIN 2.3* 2.8*   No results for input(s): LIPASE, AMYLASE in the last 168 hours. No results for input(s):  AMMONIA in the last 168 hours. CBC:  Recent Labs Lab 05/13/14 0305 05/17/14 0547  WBC 8.4 11.6*  HGB 9.3* 10.0*  HCT 29.7* 32.5*  MCV 83.4 84.9  PLT 268 348   Cardiac Enzymes: No results for input(s): CKTOTAL, CKMB, CKMBINDEX, TROPONINI in the last 168 hours. BNP (last 3 results)  Recent Labs  03/29/14 0429  BNP 73.0    ProBNP (last 3 results) No results for input(s): PROBNP in the last 8760 hours.  CBG: No results for input(s): GLUCAP in the last 168 hours.  Recent Results (from the past 240 hour(s))  AFB culture with smear     Status: None (Preliminary result)   Collection Time: 05/15/14  1:22 PM  Result Value Ref Range Status   Specimen Description ABSCESS BACK  Final   Special Requests L2 THRU 3 DISC  Final   Acid Fast Smear   Final    NO ACID FAST BACILLI SEEN Performed at [facility]    Culture   Final    CULTURE WILL BE EXAMINED FOR 6 WEEKS BEFORE ISSUING A FINAL REPORT Performed at [facility]    Report Status PENDING  Incomplete  Culture, routine-abscess     Status: None   Collection Time: 05/15/14  1:24 PM  Result Value Ref Range Status   Specimen Description ABSCESS BACK  Final   Special Requests L2 THRU 3 DISC  Final   Gram Stain  Final    RARE WBC PRESENT,BOTH PMN AND MONONUCLEAR NO SQUAMOUS EPITHELIAL CELLS SEEN NO ORGANISMS SEEN Performed at [facility]    Culture   Final    NO GROWTH 3 DAYS Performed at [facility]    Report Status 05/18/2014 FINAL  Final     Studies:  Recent x-ray studies have been reviewed in detail by the Attending Physician  Scheduled Meds:  Scheduled Meds: . acidophilus  1 capsule Oral Daily  . aspirin EC  81 mg Oral Daily  . baclofen  10 mg Oral BID  . buPROPion  150 mg Oral Daily  . carvedilol  12.5 mg Oral BID WC  . cefTRIAXone (ROCEPHIN)  IV  2 g Intravenous Q24H  . colchicine  0.6 mg Oral BID  . ezetimibe  10 mg Oral Daily  . fentaNYL  25 mcg Transdermal Q72H    . fluticasone  1 spray Each Nare Daily  . gabapentin  400 mg Oral TID  . Gerhardt's butt cream   Topical Daily  . levothyroxine  200 mcg Oral QAC breakfast  . levothyroxine  25 mcg Oral QAC breakfast  . loratadine  10 mg Oral Daily  . montelukast  10 mg Oral Daily  . mupirocin cream   Topical BID  . QUEtiapine  75 mg Oral QHS  . senna-docusate  1 tablet Oral QHS  . sodium chloride  10-40 mL Intracatheter Q12H  . vancomycin  1,250 mg Intravenous Q12H  . vancomycin  125 mg Oral 4 times per day    Time spent on care of this patient: 30 mins   [facility] Office  [phone] Pager - [phone]  On-Call/Text Page:      [email or web address]      password [email or web address]  If 7PM-7AM, please contact night-coverage [email or web address] Password [email or web address] 05/19/2014, 12:33 PM   LOS: 12 days

## 2014-05-20 DIAGNOSIS — Z743 Need for continuous supervision: Secondary | ICD-10-CM | POA: Diagnosis not present

## 2014-05-20 DIAGNOSIS — J189 Pneumonia, unspecified organism: Secondary | ICD-10-CM | POA: Diagnosis not present

## 2014-05-20 DIAGNOSIS — R279 Unspecified lack of coordination: Secondary | ICD-10-CM | POA: Diagnosis not present

## 2014-05-20 DIAGNOSIS — G43909 Migraine, unspecified, not intractable, without status migrainosus: Secondary | ICD-10-CM | POA: Diagnosis not present

## 2014-05-20 DIAGNOSIS — G9340 Encephalopathy, unspecified: Secondary | ICD-10-CM | POA: Diagnosis not present

## 2014-05-20 DIAGNOSIS — I10 Essential (primary) hypertension: Secondary | ICD-10-CM | POA: Diagnosis not present

## 2014-05-20 DIAGNOSIS — K219 Gastro-esophageal reflux disease without esophagitis: Secondary | ICD-10-CM | POA: Diagnosis not present

## 2014-05-20 DIAGNOSIS — M4645 Discitis, unspecified, thoracolumbar region: Secondary | ICD-10-CM | POA: Diagnosis not present

## 2014-05-20 DIAGNOSIS — M47816 Spondylosis without myelopathy or radiculopathy, lumbar region: Secondary | ICD-10-CM | POA: Diagnosis not present

## 2014-05-20 DIAGNOSIS — J9811 Atelectasis: Secondary | ICD-10-CM | POA: Diagnosis not present

## 2014-05-20 DIAGNOSIS — J45998 Other asthma: Secondary | ICD-10-CM | POA: Diagnosis not present

## 2014-05-20 DIAGNOSIS — D72829 Elevated white blood cell count, unspecified: Secondary | ICD-10-CM | POA: Diagnosis not present

## 2014-05-20 DIAGNOSIS — E039 Hypothyroidism, unspecified: Secondary | ICD-10-CM | POA: Diagnosis not present

## 2014-05-20 DIAGNOSIS — F339 Major depressive disorder, recurrent, unspecified: Secondary | ICD-10-CM | POA: Diagnosis not present

## 2014-05-20 DIAGNOSIS — J9600 Acute respiratory failure, unspecified whether with hypoxia or hypercapnia: Secondary | ICD-10-CM | POA: Diagnosis not present

## 2014-05-20 DIAGNOSIS — R05 Cough: Secondary | ICD-10-CM | POA: Diagnosis not present

## 2014-05-20 DIAGNOSIS — R197 Diarrhea, unspecified: Secondary | ICD-10-CM | POA: Diagnosis not present

## 2014-05-20 DIAGNOSIS — L271 Localized skin eruption due to drugs and medicaments taken internally: Secondary | ICD-10-CM | POA: Diagnosis not present

## 2014-05-20 DIAGNOSIS — D649 Anemia, unspecified: Secondary | ICD-10-CM | POA: Diagnosis not present

## 2014-05-20 DIAGNOSIS — R600 Localized edema: Secondary | ICD-10-CM | POA: Diagnosis not present

## 2014-05-20 DIAGNOSIS — N390 Urinary tract infection, site not specified: Secondary | ICD-10-CM | POA: Diagnosis not present

## 2014-05-20 DIAGNOSIS — F3130 Bipolar disorder, current episode depressed, mild or moderate severity, unspecified: Secondary | ICD-10-CM | POA: Diagnosis not present

## 2014-05-20 DIAGNOSIS — L89150 Pressure ulcer of sacral region, unstageable: Secondary | ICD-10-CM | POA: Diagnosis not present

## 2014-05-20 DIAGNOSIS — E785 Hyperlipidemia, unspecified: Secondary | ICD-10-CM | POA: Diagnosis not present

## 2014-05-20 DIAGNOSIS — M5136 Other intervertebral disc degeneration, lumbar region: Secondary | ICD-10-CM | POA: Diagnosis not present

## 2014-05-20 DIAGNOSIS — M4646 Discitis, unspecified, lumbar region: Secondary | ICD-10-CM | POA: Diagnosis not present

## 2014-05-20 DIAGNOSIS — M4624 Osteomyelitis of vertebra, thoracic region: Secondary | ICD-10-CM | POA: Diagnosis not present

## 2014-05-20 DIAGNOSIS — G8929 Other chronic pain: Secondary | ICD-10-CM | POA: Diagnosis not present

## 2014-05-20 DIAGNOSIS — M549 Dorsalgia, unspecified: Secondary | ICD-10-CM | POA: Diagnosis not present

## 2014-05-20 DIAGNOSIS — B009 Herpesviral infection, unspecified: Secondary | ICD-10-CM | POA: Diagnosis not present

## 2014-05-20 DIAGNOSIS — E669 Obesity, unspecified: Secondary | ICD-10-CM | POA: Diagnosis not present

## 2014-05-20 DIAGNOSIS — M519 Unspecified thoracic, thoracolumbar and lumbosacral intervertebral disc disorder: Secondary | ICD-10-CM | POA: Diagnosis not present

## 2014-05-20 DIAGNOSIS — B377 Candidal sepsis: Secondary | ICD-10-CM | POA: Diagnosis not present

## 2014-05-20 DIAGNOSIS — M419 Scoliosis, unspecified: Secondary | ICD-10-CM | POA: Diagnosis not present

## 2014-05-20 DIAGNOSIS — K760 Fatty (change of) liver, not elsewhere classified: Secondary | ICD-10-CM | POA: Diagnosis not present

## 2014-05-20 DIAGNOSIS — B999 Unspecified infectious disease: Secondary | ICD-10-CM | POA: Diagnosis not present

## 2014-05-20 DIAGNOSIS — N12 Tubulo-interstitial nephritis, not specified as acute or chronic: Secondary | ICD-10-CM | POA: Diagnosis not present

## 2014-05-20 DIAGNOSIS — A047 Enterocolitis due to Clostridium difficile: Secondary | ICD-10-CM | POA: Diagnosis not present

## 2014-05-20 DIAGNOSIS — Z9889 Other specified postprocedural states: Secondary | ICD-10-CM | POA: Diagnosis not present

## 2014-05-20 DIAGNOSIS — G061 Intraspinal abscess and granuloma: Secondary | ICD-10-CM | POA: Diagnosis not present

## 2014-05-20 DIAGNOSIS — N179 Acute kidney failure, unspecified: Secondary | ICD-10-CM | POA: Diagnosis not present

## 2014-05-20 DIAGNOSIS — F419 Anxiety disorder, unspecified: Secondary | ICD-10-CM | POA: Diagnosis not present

## 2014-05-20 DIAGNOSIS — M4300 Spondylolysis, site unspecified: Secondary | ICD-10-CM | POA: Diagnosis not present

## 2014-05-20 DIAGNOSIS — B360 Pityriasis versicolor: Secondary | ICD-10-CM | POA: Diagnosis not present

## 2014-05-20 DIAGNOSIS — E871 Hypo-osmolality and hyponatremia: Secondary | ICD-10-CM | POA: Diagnosis not present

## 2014-05-20 DIAGNOSIS — R6521 Severe sepsis with septic shock: Secondary | ICD-10-CM | POA: Diagnosis not present

## 2014-05-20 DIAGNOSIS — M47817 Spondylosis without myelopathy or radiculopathy, lumbosacral region: Secondary | ICD-10-CM | POA: Diagnosis not present

## 2014-05-20 DIAGNOSIS — J309 Allergic rhinitis, unspecified: Secondary | ICD-10-CM | POA: Diagnosis not present

## 2014-05-20 DIAGNOSIS — D1809 Hemangioma of other sites: Secondary | ICD-10-CM | POA: Diagnosis not present

## 2014-05-20 DIAGNOSIS — L282 Other prurigo: Secondary | ICD-10-CM | POA: Diagnosis not present

## 2014-05-20 DIAGNOSIS — M7989 Other specified soft tissue disorders: Secondary | ICD-10-CM | POA: Diagnosis not present

## 2014-05-20 DIAGNOSIS — M869 Osteomyelitis, unspecified: Secondary | ICD-10-CM | POA: Diagnosis not present

## 2014-05-20 LAB — COMPREHENSIVE METABOLIC PANEL
ALBUMIN: 2.7 g/dL — AB (ref 3.5–5.2)
ALT: 80 U/L — ABNORMAL HIGH (ref 0–35)
AST: 136 U/L — AB (ref 0–37)
Alkaline Phosphatase: 102 U/L (ref 39–117)
Anion gap: 7 (ref 5–15)
BUN: 13 mg/dL (ref 6–23)
CALCIUM: 8.6 mg/dL (ref 8.4–10.5)
CO2: 23 mmol/L (ref 19–32)
Chloride: 104 mmol/L (ref 96–112)
Creatinine, Ser: 0.4 mg/dL — ABNORMAL LOW (ref 0.50–1.10)
GFR calc Af Amer: >90 mL/min (ref >90)
Glucose, Bld: 126 mg/dL — ABNORMAL HIGH (ref 70–99)
Potassium: 4.6 mmol/L (ref 3.5–5.1)
Sodium: 134 mmol/L — ABNORMAL LOW (ref 135–145)
Total Bilirubin: 0.6 mg/dL (ref 0.3–1.2)
Total Protein: 6.2 g/dL (ref 6.0–8.3)

## 2014-05-20 LAB — CBC
HEMATOCRIT: 29 % — AB (ref 36.0–46.0)
HEMOGLOBIN: 9 g/dL — AB (ref 12.0–15.0)
MCH: 26.5 pg (ref 26.0–34.0)
MCHC: 31 g/dL (ref 30.0–36.0)
MCV: 85.5 fL (ref 78.0–100.0)
Platelets: 331 10*3/uL (ref 150–400)
RBC: 3.39 MIL/uL — ABNORMAL LOW (ref 3.87–5.11)
RDW: 16.9 % — ABNORMAL HIGH (ref 11.5–15.5)
WBC: 9.2 10*3/uL (ref 4.0–10.5)

## 2014-05-20 MED ORDER — OXYCODONE HCL 5 MG PO CAPS: 5.0000 mg | ORAL_CAPSULE | Freq: Four times a day (QID) | ORAL | Status: DC | PRN

## 2014-05-20 MED ORDER — MAGNESIUM OXIDE 400 (241.3 MG) MG PO TABS: 400.0000 mg | ORAL_TABLET | Freq: Every day | ORAL | Status: DC

## 2014-05-20 MED ORDER — MUPIROCIN CALCIUM 2 % EX CREA: TOPICAL_CREAM | Freq: Two times a day (BID) | CUTANEOUS | Status: DC

## 2014-05-20 MED ORDER — HEPARIN SOD (PORK) LOCK FLUSH 100 UNIT/ML IV SOLN: 250.0000 [IU] | INTRAVENOUS | Status: DC | PRN

## 2014-05-20 MED ORDER — VANCOMYCIN 50 MG/ML ORAL SOLUTION: 125.0000 mg | Freq: Four times a day (QID) | ORAL | Status: AC

## 2014-05-20 MED ORDER — LORAZEPAM 0.5 MG PO TABS: 0.2500 mg | ORAL_TABLET | Freq: Four times a day (QID) | ORAL | Status: DC | PRN

## 2014-05-20 MED ORDER — CEFTRIAXONE SODIUM IN DEXTROSE 40 MG/ML IV SOLN: 2.0000 g | INTRAVENOUS | Status: AC

## 2014-05-20 MED ORDER — VANCOMYCIN HCL 10 G IV SOLR: 1250.0000 mg | Freq: Two times a day (BID) | INTRAVENOUS | Status: DC

## 2014-05-20 MED ORDER — HEPARIN SOD (PORK) LOCK FLUSH 100 UNIT/ML IV SOLN
250.0000 [IU] | INTRAVENOUS | Status: AC | PRN
  Administered 2014-05-20: 250 [IU]

## 2014-05-20 NOTE — Discharge Summary (Signed)
Physician Discharge Summary  [name] MRN: [id number] DOB/AGE: 08/23/1945 69 y.o.  PCP: [name], MD   Admit date: 05/07/2014 Discharge date: 05/20/2014  Discharge Diagnoses:      Paraplegic spinal paralysis   Thyroid disease   Sepsis   Diarrhea   Hypothyroid   ARF (acute renal failure)   Enteritis due to Clostridium difficile   UTI (urinary tract infection)   Chronic indwelling Foley catheter   Osteomyelitis   Discitis of thoracolumbar region   Anxiety state   Chronic pain syndrome   Sepsis due to Candida species   Spondylarthrosis   Acute renal failure syndrome   Other specified hypothyroidism   Essential hypertension   Paraplegia   Abscess in epidural space of lumbar spine   Blood poisoning   Discitis of lumbar region  Follow-up recommendations 1. Continue oral vancomycin while on IV antibiotics 2. Continue IV vancomycin and ceftriaxone for 6 weeks minimum 3. Follow-up with Dr. Megan Salon infectious disease 4. Follow-up with PCP in 5-7 days in about 6 weeks 5. Follow-up MRI lumbar spine in 6-8 weeks      Medication List    STOP taking these medications        fentaNYL 25 MCG/HR patch  Commonly known as:  DURAGESIC - dosed mcg/hr     lisinopril 5 MG tablet  Commonly known as:  PRINIVIL,ZESTRIL      TAKE these medications        acetaminophen 500 MG tablet  Commonly known as:  TYLENOL  Take 500 mg by mouth every 6 (six) hours as needed for pain.     acidophilus Caps capsule  Take 1 capsule by mouth daily.     alendronate 70 MG tablet  Commonly known as:  FOSAMAX  Take 70 mg by mouth once a week. Take with a full glass of water on an empty stomach.     alum & mag hydroxide-simeth 200-200-20 MG/5ML suspension  Commonly known as:  MAALOX/MYLANTA  Take 30 mLs by mouth every 4 (four) hours as needed for indigestion or heartburn.     aspirin EC 81 MG tablet  Take 81 mg by mouth daily.     b complex vitamins tablet  Take 1 tablet by  mouth daily.     baclofen 10 MG tablet  Commonly known as:  LIORESAL  Take 10 mg by mouth 2 (two) times daily.     buPROPion 150 MG 24 hr tablet  Commonly known as:  WELLBUTRIN XL  Take 150 mg by mouth daily.     calcium citrate-vitamin D 500-400 MG-UNIT chewable tablet  Chew 1 tablet by mouth 2 (two) times daily.     carvedilol 12.5 MG tablet  Commonly known as:  COREG  Take 12.5 mg by mouth 2 (two) times daily with a meal.     cefTRIAXone 40 MG/ML IVPB  Commonly known as:  ROCEPHIN  Inject 50 mLs (2 g total) into the vein daily.     cetirizine 10 MG tablet  Commonly known as:  ZYRTEC  Take 10 mg by mouth daily.     colchicine 0.6 MG tablet  Take 0.6 mg by mouth 2 (two) times daily.     Cranberry 475 MG Caps  Take 1 capsule by mouth daily.     dextromethorphan-guaiFENesin 30-600 MG per 12 hr tablet  Commonly known as:  MUCINEX DM  Take 1 tablet by mouth 2 (two) times daily as needed for cough.  diphenhydrAMINE 12.5 MG chewable tablet  Commonly known as:  BENADRYL  Chew 12.5 mg by mouth 4 (four) times daily as needed for allergies.     ezetimibe 10 MG tablet  Commonly known as:  ZETIA  Take 10 mg by mouth daily.     feeding supplement (PRO-STAT SUGAR FREE 64) Liqd  Take 30 mLs by mouth 3 (three) times daily with meals.     furosemide 40 MG tablet  Commonly known as:  LASIX  Take 40 mg by mouth daily.     gabapentin 300 MG capsule  Commonly known as:  NEURONTIN  Take 300 mg by mouth 3 (three) times daily.     hydroxypropyl methylcellulose / hypromellose 2.5 % ophthalmic solution  Commonly known as:  ISOPTO TEARS / GONIOVISC  Place 1 drop into both eyes as needed for dry eyes.     ipratropium-albuterol 0.5-2.5 (3) MG/3ML Soln  Commonly known as:  DUONEB  Take 3 mLs by nebulization every 6 (six) hours as needed (shortness of breath).     levothyroxine 25 MCG tablet  Commonly known as:  SYNTHROID, LEVOTHROID  Take 25 mcg by mouth daily. Takes with  Synthroid (Levothyroxine) 228mg for a total of 2279m daily at 6:00am     levothyroxine 200 MCG tablet  Commonly known as:  SYNTHROID, LEVOTHROID  Take 200 mcg by mouth daily. Takes with Synthroid (Levothyroxine) 25 mcg for a total of 22534mdaily at 6:00am     LORazepam 0.5 MG tablet  Commonly known as:  ATIVAN  Take 0.5 tablets (0.25 mg total) by mouth every 6 (six) hours as needed for anxiety.     magnesium oxide 400 (241.3 MG) MG tablet  Commonly known as:  MAG-OX  Take 1 tablet (400 mg total) by mouth daily.     montelukast 10 MG tablet  Commonly known as:  SINGULAIR  Take 10 mg by mouth daily.     mupirocin cream 2 %  Commonly known as:  BACTROBAN  Apply topically 2 (two) times daily.     oxycodone 5 MG capsule  Commonly known as:  OXY-IR  Take 1 capsule (5 mg total) by mouth every 6 (six) hours as needed.     POLY-IRON 150 150 MG capsule  Generic drug:  iron polysaccharides  Take 150 mg by mouth daily.     promethazine 12.5 MG tablet  Commonly known as:  PHENERGAN  Take 12.5 mg by mouth every 6 (six) hours as needed for nausea or vomiting.     PROVENTIL HFA 108 (90 BASE) MCG/ACT inhaler  Generic drug:  albuterol  Inhale 2 puffs into the lungs every 8 (eight) hours as needed (Asthma).     QUEtiapine 50 MG tablet  Commonly known as:  SEROQUEL  Take 50 mg by mouth at bedtime.     QUEtiapine 25 MG tablet  Commonly known as:  SEROQUEL  Take 0.5 tablets (12.5 mg total) by mouth at bedtime.     SEA-OMEGA PO  Take 1 capsule by mouth 2 (two) times daily.     senna-docusate 8.6-50 MG per tablet  Commonly known as:  Senokot-S  Take 1 tablet by mouth at bedtime.     tiZANidine 2 MG tablet  Commonly known as:  ZANAFLEX  Take 2 mg by mouth every 6 (six) hours as needed for muscle spasms.     vancomycin 1,250 mg in sodium chloride 0.9 % 250 mL  Inject 1,250 mg into the vein every 12 (twelve) hours.  vancomycin 50 mg/mL oral solution  Commonly known as:   VANCOCIN  Take 2.5 mLs (125 mg total) by mouth every 6 (six) hours.     vitamin C 500 MG tablet  Commonly known as:  ASCORBIC ACID  Take 500 mg by mouth 2 (two) times daily.        Discharge Condition: Stable   Disposition: 03-Skilled Nursing Facility   Consults:  Infectious disease    Significant Diagnostic Studies: Mr Thoracic Spine W Wo Contrast  05/08/2014   : ADDENDUM REPORT: 05/08/2014 19:55  ADDENDUM: Critical Value/emergent results were called by telephone at the time of interpretation on 05/08/2014 at 7:55 pm to Dr. Darrick Meigs , who verbally acknowledged these results.  Electronically Signed By: Franchot Gallo M.D. On: 05/08/2014 19:55  CLINICAL DATA: History of spinal abscess. Sepsis. Elevated white blood count 22000. Hypertension. Abnormal x-ray at L2-3.  EXAM: MRI THORACIC AND LUMBAR SPINE WITHOUT AND WITH CONTRAST  TECHNIQUE: Multiplanar and multiecho pulse sequences of the thoracic and lumbar spine were obtained without and with intravenous contrast.  CONTRAST: 60m MULTIHANCE GADOBENATE DIMEGLUMINE 529 MG/ML IV SOLN  COMPARISON: Thoracic MRI 07/10/2006 and 06/30/2006  FINDINGS: MR THORACIC SPINE FINDINGS  Image quality degraded by motion.  Multiple hemangiomata in the thoracic spine vertebral bodies including T4, T8, T10, T12. Negative for fracture. Postcontrast imaging significantly degraded by motion however no evidence of neoplasm is identified  The spinal canal is markedly abnormal. There is a large fluid collection in the spinal canal beginning at approximately T3 and extending third T11. Based on axial images, this appears to be extramedullary and has CSF signal intensity. There is some layering low signal material within the cyst on gradient echo imaging suggesting there may be blood within the fluid. This is compressing the cord beginning at T4 and extending through T12. The cord is difficult to identify below T7 and appears markedly atrophic. Postcontrast imaging does not have  significant dural enhancement suggesting epidural abscess.  Large mass within the spinal canal the T7 level of uncertain etiology. This is low signal on T1 and T2 and could represent a hematoma or large calcified disc fragment. This also is contributing to significant cord compression.  Multilevel disc degeneration and spondylosis most prominent at C6-7, T7-8, T8-9, and T9-10 contribute to spinal stenosis.  There has been decompressive laminectomy T10 through T12. Correlate with prior surgical findings.  MR LUMBAR SPINE FINDINGS  Image quality degraded by significant motion.  Bony destruction at L2-3 centered at the disc space with extensive fluid between the upper L2 lower L3 vertebral bodies. This area shows diffuse enhancement following contrast infusion. In addition, there is diffuse dural enhancement in the lower lumbar canal suggesting spinal infection and epidural infection. Enhancement extends into the facet joints bilaterally. There is rotation of L2 on L3 secondary to instability. There is severe spinal stenosis at L2-3 with marked compression of the thecal sac.  Epidural fluid collection extends to T11 with compression of the cord as described above. Conus medullaris terminates at T12-L1. Possible syrinx in the distal conus.  Chronic degenerative changes are present throughout the lumbar spine involving the disc spaces and facet joints.  IMPRESSION: Image quality is significantly degraded by motion in the thoracic and lumbar spine.  Large extramedullary fluid collection in the thoracic spine from T4 through T11. This is compressing the cord. There is marked cord atrophy especially at T7-8 and below. This fluid collection could be related to chronic hematoma as some of the signal characteristics suggests  layering blood. There is also a non cystic mass within the spinal canal compressing the cord at the T7 level which could be hematoma. Calcified disc or epidural mass are other considerations.  The marked  destructive process at L2-3 with severe spinal stenosis. There is extensive enhancement of a large fluid collection between the L2 and L3 vertebral bodies extending into the facet joints bilaterally, concerning for osteomyelitis and abscess, particularly given the patient's clinical findings.   Electronically Signed   By: Franchot Gallo M.D.   On: 05/08/2014 20:03   Mr Lumbar Spine W Wo Contrast  05/08/2014   ADDENDUM REPORT: 05/08/2014 19:55  ADDENDUM: Critical Value/emergent results were called by telephone at the time of interpretation on 05/08/2014 at 7:55 pm to Dr. Darrick Meigs , who verbally acknowledged these results.   Electronically Signed   By: Franchot Gallo M.D.   On: 05/08/2014 19:55   05/08/2014   CLINICAL DATA:  History of spinal abscess. Sepsis. Elevated white blood count 22000. Hypertension. Abnormal x-ray at L2-3.  EXAM: MRI THORACIC AND LUMBAR SPINE WITHOUT AND WITH CONTRAST  TECHNIQUE: Multiplanar and multiecho pulse sequences of the thoracic and lumbar spine were obtained without and with intravenous contrast.  CONTRAST:  63m MULTIHANCE GADOBENATE DIMEGLUMINE 529 MG/ML IV SOLN  COMPARISON:  Thoracic MRI 07/10/2006 and 06/30/2006  FINDINGS: MR THORACIC SPINE FINDINGS  Image quality degraded by motion.  Multiple hemangiomata in the thoracic spine vertebral bodies including T4, T8, T10, T12. Negative for fracture. Postcontrast imaging significantly degraded by motion however no evidence of neoplasm is identified  The spinal canal is markedly abnormal. There is a large fluid collection in the spinal canal beginning at approximately T3 and extending third T11. Based on axial images, this appears to be extramedullary and has CSF signal intensity. There is some layering low signal material within the cyst on gradient echo imaging suggesting there may be blood within the fluid. This is compressing the cord beginning at T4 and extending through T12. The cord is difficult to identify below T7 and appears  markedly atrophic. Postcontrast imaging does not have significant dural enhancement suggesting epidural abscess.  Large mass within the spinal canal the T7 level of uncertain etiology. This is low signal on T1 and T2 and could represent a hematoma or large calcified disc fragment. This also is contributing to significant cord compression.  Multilevel disc degeneration and spondylosis most prominent at C6-7, T7-8, T8-9, and T9-10 contribute to spinal stenosis.  There has been decompressive laminectomy T10 through T12. Correlate with prior surgical findings.  MR LUMBAR SPINE FINDINGS  Image quality degraded by significant motion.  Bony destruction at L2-3 centered at the disc space with extensive fluid between the upper L2 lower L3 vertebral bodies. This area shows diffuse enhancement following contrast infusion. In addition, there is diffuse dural enhancement in the lower lumbar canal suggesting spinal infection and epidural infection. Enhancement extends into the facet joints bilaterally. There is rotation of L2 on L3 secondary to instability. There is severe spinal stenosis at L2-3 with marked compression of the thecal sac.  Epidural fluid collection extends to T11 with compression of the cord as described above. Conus medullaris terminates at T12-L1. Possible syrinx in the distal conus.  Chronic degenerative changes are present throughout the lumbar spine involving the disc spaces and facet joints.  IMPRESSION: Image quality is significantly degraded by motion in the thoracic and lumbar spine.  Large extramedullary fluid collection in the thoracic spine from T4 through T11. This is compressing  the cord. There is marked cord atrophy especially at T7-8 and below. This fluid collection could be related to chronic hematoma as some of the signal characteristics suggests layering blood. There is also a non cystic mass within the spinal canal compressing the cord at the T7 level which could be hematoma. Calcified disc or  epidural mass are other considerations.  The marked destructive process at L2-3 with severe spinal stenosis. There is extensive enhancement of a large fluid collection between the L2 and L3 vertebral bodies extending into the facet joints bilaterally, concerning for osteomyelitis and abscess, particularly given the patient's clinical findings.  Electronically Signed: By: Franchot Gallo M.D. On: 05/08/2014 19:46   Ir Fluoro Guide Ndl Plmt / Bx  05/16/2014   CLINICAL DATA:  Lumbar L2-L3 diskitis.  EXAM: IR FLUORO GUIDE NEEDLE PLACEMENT /BIOPSY  ANESTHESIA/SEDATION: Conscious sedation.  MEDICATIONS: Versed 1 mg IV.  Fentanyl 25 mcg IV.  CONTRAST:  None.  PROCEDURE: Following a full explanation the procedure along with the potential associated complications, an informed witnessed consent was obtained.  The patient was laid prone on the fluoroscopic table. The skin overlying the lumbar region was then prepped and draped in the usual sterile fashion.  The skin entry site overlying the right paraspinous region at L2-L3 was then infiltrated with 0.25% bupivacaine.  Using biplane intermittent fluoroscopy, a 21 gauge Franseen needle was advanced to the disc space at L2-L3.  Using a 20 mL syringe, blood-tinged thick aspirate was obtained and sent for microbiologic analysis.  A second pass was also made again using a new Franseen 21 gauge needle. Again after having confirmed location at a different site in the disc space, more blood tinged like fluid was obtained and sent for microbiologic analysis.  The needle was removed. Hemostasis was achieved at the skin entry site. The patient tolerated the procedure well. Approximately 4.5 cc of total aspirate was sent for analysis.  COMPLICATIONS: None immediate.  FINDINGS: As above.  IMPRESSION: Status post fluoroscopic-guided needle placement at L2-L3 disc space with aspiration for discitis.   Electronically Signed   By: Luanne Bras M.D.   On: 05/15/2014 14:08   Dg Abd  Acute W/chest  05/07/2014   CLINICAL DATA:  69 year old female with diaphoresis, diarrhea and hypotension.  EXAM: ACUTE ABDOMEN SERIES (ABDOMEN 2 VIEW & CHEST 1 VIEW)  COMPARISON:  Prior chest x-ray 03/31/2014 ; CT abdomen/pelvis 06/30/2006  FINDINGS: Cardiac and mediastinal contours are within normal limits. Bibasilar atelectasis. No focal airspace consolidation. No evidence of free air on the up right radiograph. Multiple radiographs are used to encompass the entirety of the abdomen. Radiographs are limited by underpenetration secondary to patient body habitus. No evidence of small bowel obstruction. Moderate colonic stool burden. Advanced destructive changes at the L1-L2 level  IMPRESSION: 1. No acute cardiopulmonary process. 2. Normal, not obstructed bowel gas pattern. 3. Advanced destructive changes are noted at the L1-L2 level which represents an interval finding compared to a remote CT scan of the abdomen from April of 2008. More precise acuity of these findings is limited without prior imaging for comparison. If there is clinical concern for osteomyelitis/discitis consider further evaluation with MRI.   Electronically Signed   By: Jacqulynn Cadet M.D.   On: 05/07/2014 17:07     Microbiology: Recent Results (from the past 240 hour(s))  AFB culture with smear     Status: None (Preliminary result)   Collection Time: 05/15/14  1:22 PM  Result Value Ref Range Status  Specimen Description ABSCESS BACK  Final   Special Requests L2 THRU 3 DISC  Final   Acid Fast Smear   Final    NO ACID FAST BACILLI SEEN Performed at [facility]    Culture   Final    CULTURE WILL BE EXAMINED FOR 6 WEEKS BEFORE ISSUING A FINAL REPORT Performed at [facility]    Report Status PENDING  Incomplete  Culture, routine-abscess     Status: None   Collection Time: 05/15/14  1:24 PM  Result Value Ref Range Status   Specimen Description ABSCESS BACK  Final   Special Requests L2 THRU 3 DISC  Final    Gram Stain   Final    RARE WBC PRESENT,BOTH PMN AND MONONUCLEAR NO SQUAMOUS EPITHELIAL CELLS SEEN NO ORGANISMS SEEN Performed at [facility]    Culture   Final    NO GROWTH 3 DAYS Performed at [facility]    Report Status 05/18/2014 FINAL  Final     Labs: Results for orders placed or performed during the hospital encounter of 05/07/14 (from the past 48 hour(s))  Magnesium     Status: None   Collection Time: 05/19/14  3:15 PM  Result Value Ref Range   Magnesium 1.6 1.5 - 2.5 mg/dL  Comprehensive metabolic panel     Status: Abnormal   Collection Time: 05/20/14  4:30 AM  Result Value Ref Range   Sodium 134 (L) 135 - 145 mmol/L   Potassium 4.6 3.5 - 5.1 mmol/L   Chloride 104 96 - 112 mmol/L   CO2 23 19 - 32 mmol/L   Glucose, Bld 126 (H) 70 - 99 mg/dL   BUN 13 6 - 23 mg/dL   Creatinine, Ser 0.40 (L) 0.50 - 1.10 mg/dL   Calcium 8.6 8.4 - 10.5 mg/dL   Total Protein 6.2 6.0 - 8.3 g/dL   Albumin 2.7 (L) 3.5 - 5.2 g/dL   AST 136 (H) 0 - 37 U/L   ALT 80 (H) 0 - 35 U/L   Alkaline Phosphatase 102 39 - 117 U/L   Total Bilirubin 0.6 0.3 - 1.2 mg/dL   GFR calc non Af Amer >90 >90 mL/min   GFR calc Af Amer >90 >90 mL/min    Comment: (NOTE) The eGFR has been calculated using the CKD EPI equation. This calculation has not been validated in all clinical situations. eGFR's persistently <90 mL/min signify possible Chronic Kidney Disease.    Anion gap 7 5 - 15  CBC     Status: Abnormal   Collection Time: 05/20/14  4:30 AM  Result Value Ref Range   WBC 9.2 4.0 - 10.5 K/uL   RBC 3.39 (L) 3.87 - 5.11 MIL/uL   Hemoglobin 9.0 (L) 12.0 - 15.0 g/dL   HCT 29.0 (L) 36.0 - 46.0 %   MCV 85.5 78.0 - 100.0 fL   MCH 26.5 26.0 - 34.0 pg   MCHC 31.0 30.0 - 36.0 g/dL   RDW 16.9 (H) 11.5 - 15.5 %   Platelets 331 150 - 400 K/uL     HPI :*Admit HPI / Brief Narrative: 69 year old WF PMHx ; Bipolar 1 disorder, anxiety, chronic pain syndrome, Paraplegic spinal paralysis;  Hypertension; Thyroid disease; Diabetes mellitus without complication; Obesity; Pyelonephritis; COPD with asthma; and GERD (gastroesophageal reflux disease). Patient was brought [facility] after she complained of jerking tremors to both upper extremities for past 3 days. In the ED patient was found to be hypotensive with  blood pressure 82/52, also patient found to have abnormal UA and complained of auditory hallucinations. Patient started on vancomycin and Zosyn for sepsis due to UTI. Lactic acid was normal with 1.55 and repeat lactic acid 0.76. Patient did have high white count of 22.1. She complains of diarrhea for past few days. Denies nausea or vomiting. Patient is alert and oriented 3, able to answer all the questions she is mentating very well despite the low blood pressure. Abdominal x-ray showed advanced destructive changes in the L1-L2 level, with recommendation for MRI for possible osteomyleitis/discitis for further evaluation. Patient has chronic Foley catheter, she was diagnosed with UTI recently on January 27.   HOSPITAL COURSE:  Bipolar 1 disorder -Continue Seroquel 75 mg QHS   Anxiety -Continue Lorazepam 0.5 mg q 4 hr PRN   Chronic pain syndrome (primary team should only adjust pain medication) Continue current pain regimen of oxycodone to 5 mg every 4 hours as needed  continue Neurontin 500 mg TID -Continue Zanaflex 2 mg q 6hr PRN and baclofen Continue fentanyl patch   Sepsis unspecified organism -Sepsis physiology resolved Culture still pending   Destructive changes at L1-L2 on x-ray/osteomyelitis/spinal abscess -X-ray of the abdomen showed destructive changes of L1-L2,  - MRI of the lumbar spine possible abscess/osteitis/discitis.  Antibiotics restarted 2/19 Seen by neurosurgery, Dr.Neelesh Nundkumar (neurosurgery)  she status post IR guided aspiration on 2/18 Pending culture results , AFB  Day #5 IV vancomycin and ceftriaxone to be continued  for another 6 weeks, stop date 4/5 Patient would need repeat MRI in 6-8 weeks Follow-up with infectious disease in 6 weeks, to determine duration of treatment   Acute kidney injury(baseline creatinine is 0.71) Resolved   Submucosal nasal cyst Probably intranasal folliculitis Continue Bactroban Also start the patient on Flonase Continue Claritin and Singulair  Acute gout flair Continue colchicine twice a day   Hypothyroidism -TSH within normal limit -Continue Synthroid 225 g daily  HTN -Controlled without BP medication   C. difficile colitis -Positive for C. Difficile. Continue on oral vancomycin per ID, day #8 She would need to be on by mouth vancomycin as long as she is on antibiotics Patient still has some diarrhea today  Paraplegia -Patient has paraplegia, has chronic Foley catheter in place;    Code Status: FULL Family Communication: no family present at time of exam Disposition Plan: Plan is to discharge patient to SNF on 2/23    Consultants: Dr.Cornelius N Tommy Medal (ID) Neurosurgery, Dr. Kathyrn Sheriff Interventional radiology        Discharge Exam:    Blood pressure 115/70, pulse 99, temperature 99.4 F (37.4 C), temperature source Oral, resp. rate 18, height '5\' 7"'  (1.702 m), weight 99.76 kg (219 lb 14.9 oz), SpO2 97 %.  General: A/O 4, NAD, No acute respiratory distress Lungs: Clear to auscultation bilaterally without wheezes or crackles Cardiovascular: Regular rate and rhythm without murmur gallop or rub normal S1 and S2 Abdomen: Morbidly Obese, Nontender, nondistended, soft, bowel sounds positive, no rebound, no ascites, no appreciable mass Extremities: No significant cyanosis, positive clubbing, positive 2+ bilateral lower extremities edema        Discharge Instructions    Diet - low sodium heart healthy    Complete by:  As directed      Increase activity slowly    Complete by:  As directed            Follow-up Information     Follow up with Michel Bickers, MD. Schedule an appointment as soon  as possible for a visit in 1 week.   Specialty:  Infectious Diseases   Contact information:   [address] [place] [address] [phone]       Follow up with [name], MD. Schedule an appointment as soon as possible for a visit in 1 week.   Specialty:  Internal Medicine   Contact information:   [address] [place]  [address] [phone]       Signed: Reyne Dumas 05/20/2014, 11:55 AM

## 2014-05-20 NOTE — Clinical Social Work Note (Signed)
CSW talked with patient about returning to [facility], as she is medically stable for discharge today. [name] has been at facility for several years, but has issue with some of the employees who work there, but don't do their job as she feels they should. When asked, patient has not reported any employee to the appropriate staff at the facility. CSW encouraged her to do so, especially as it concerns the care she receives. Discharge information forwarded to facility and patient will be transported to facility by ambulance today.   [name], MSW, LCSW Licensed Clinical Social Worker [place] [phone]

## 2014-05-21 DIAGNOSIS — G061 Intraspinal abscess and granuloma: Secondary | ICD-10-CM | POA: Diagnosis not present

## 2014-05-21 DIAGNOSIS — A047 Enterocolitis due to Clostridium difficile: Secondary | ICD-10-CM | POA: Diagnosis not present

## 2014-05-21 DIAGNOSIS — N390 Urinary tract infection, site not specified: Secondary | ICD-10-CM | POA: Diagnosis not present

## 2014-05-21 DIAGNOSIS — E785 Hyperlipidemia, unspecified: Secondary | ICD-10-CM | POA: Diagnosis not present

## 2014-05-21 DIAGNOSIS — M519 Unspecified thoracic, thoracolumbar and lumbosacral intervertebral disc disorder: Secondary | ICD-10-CM | POA: Diagnosis not present

## 2014-05-21 DIAGNOSIS — D649 Anemia, unspecified: Secondary | ICD-10-CM | POA: Diagnosis not present

## 2014-05-21 DIAGNOSIS — K760 Fatty (change of) liver, not elsewhere classified: Secondary | ICD-10-CM | POA: Diagnosis not present

## 2014-05-22 DIAGNOSIS — N390 Urinary tract infection, site not specified: Secondary | ICD-10-CM | POA: Diagnosis not present

## 2014-05-22 DIAGNOSIS — G061 Intraspinal abscess and granuloma: Secondary | ICD-10-CM | POA: Diagnosis not present

## 2014-05-22 DIAGNOSIS — K760 Fatty (change of) liver, not elsewhere classified: Secondary | ICD-10-CM | POA: Diagnosis not present

## 2014-05-22 DIAGNOSIS — D649 Anemia, unspecified: Secondary | ICD-10-CM | POA: Diagnosis not present

## 2014-05-22 DIAGNOSIS — A047 Enterocolitis due to Clostridium difficile: Secondary | ICD-10-CM | POA: Diagnosis not present

## 2014-05-22 DIAGNOSIS — M519 Unspecified thoracic, thoracolumbar and lumbosacral intervertebral disc disorder: Secondary | ICD-10-CM | POA: Diagnosis not present

## 2014-05-22 DIAGNOSIS — E785 Hyperlipidemia, unspecified: Secondary | ICD-10-CM | POA: Diagnosis not present

## 2014-05-28 ENCOUNTER — Other Ambulatory Visit (HOSPITAL_COMMUNITY): Payer: Self-pay | Admitting: Internal Medicine

## 2014-05-28 DIAGNOSIS — A047 Enterocolitis due to Clostridium difficile: Secondary | ICD-10-CM | POA: Diagnosis not present

## 2014-05-28 DIAGNOSIS — M4646 Discitis, unspecified, lumbar region: Secondary | ICD-10-CM

## 2014-05-28 DIAGNOSIS — M519 Unspecified thoracic, thoracolumbar and lumbosacral intervertebral disc disorder: Secondary | ICD-10-CM | POA: Diagnosis not present

## 2014-05-28 DIAGNOSIS — G061 Intraspinal abscess and granuloma: Secondary | ICD-10-CM | POA: Diagnosis not present

## 2014-05-28 DIAGNOSIS — D649 Anemia, unspecified: Secondary | ICD-10-CM | POA: Diagnosis not present

## 2014-05-28 DIAGNOSIS — M4645 Discitis, unspecified, thoracolumbar region: Secondary | ICD-10-CM

## 2014-05-30 DIAGNOSIS — G8929 Other chronic pain: Secondary | ICD-10-CM | POA: Diagnosis not present

## 2014-05-30 DIAGNOSIS — K219 Gastro-esophageal reflux disease without esophagitis: Secondary | ICD-10-CM | POA: Diagnosis not present

## 2014-05-30 DIAGNOSIS — E039 Hypothyroidism, unspecified: Secondary | ICD-10-CM | POA: Diagnosis not present

## 2014-05-30 DIAGNOSIS — G061 Intraspinal abscess and granuloma: Secondary | ICD-10-CM | POA: Diagnosis not present

## 2014-05-30 DIAGNOSIS — G43909 Migraine, unspecified, not intractable, without status migrainosus: Secondary | ICD-10-CM | POA: Diagnosis not present

## 2014-05-30 DIAGNOSIS — A047 Enterocolitis due to Clostridium difficile: Secondary | ICD-10-CM | POA: Diagnosis not present

## 2014-05-31 DIAGNOSIS — G8929 Other chronic pain: Secondary | ICD-10-CM | POA: Diagnosis not present

## 2014-05-31 DIAGNOSIS — G43909 Migraine, unspecified, not intractable, without status migrainosus: Secondary | ICD-10-CM | POA: Diagnosis not present

## 2014-05-31 DIAGNOSIS — E039 Hypothyroidism, unspecified: Secondary | ICD-10-CM | POA: Diagnosis not present

## 2014-05-31 DIAGNOSIS — A047 Enterocolitis due to Clostridium difficile: Secondary | ICD-10-CM | POA: Diagnosis not present

## 2014-05-31 DIAGNOSIS — G061 Intraspinal abscess and granuloma: Secondary | ICD-10-CM | POA: Diagnosis not present

## 2014-05-31 DIAGNOSIS — K219 Gastro-esophageal reflux disease without esophagitis: Secondary | ICD-10-CM | POA: Diagnosis not present

## 2014-06-02 ENCOUNTER — Ambulatory Visit: Payer: Medicare Other | Admitting: Internal Medicine

## 2014-06-03 DIAGNOSIS — G061 Intraspinal abscess and granuloma: Secondary | ICD-10-CM | POA: Diagnosis not present

## 2014-06-03 DIAGNOSIS — M519 Unspecified thoracic, thoracolumbar and lumbosacral intervertebral disc disorder: Secondary | ICD-10-CM | POA: Diagnosis not present

## 2014-06-03 DIAGNOSIS — D649 Anemia, unspecified: Secondary | ICD-10-CM | POA: Diagnosis not present

## 2014-06-03 DIAGNOSIS — A047 Enterocolitis due to Clostridium difficile: Secondary | ICD-10-CM | POA: Diagnosis not present

## 2014-06-04 ENCOUNTER — Other Ambulatory Visit: Payer: Self-pay | Admitting: Internal Medicine

## 2014-06-04 ENCOUNTER — Other Ambulatory Visit (HOSPITAL_COMMUNITY): Payer: Medicare Other

## 2014-06-04 DIAGNOSIS — A047 Enterocolitis due to Clostridium difficile: Secondary | ICD-10-CM | POA: Diagnosis not present

## 2014-06-04 DIAGNOSIS — M519 Unspecified thoracic, thoracolumbar and lumbosacral intervertebral disc disorder: Secondary | ICD-10-CM | POA: Diagnosis not present

## 2014-06-04 DIAGNOSIS — J45998 Other asthma: Secondary | ICD-10-CM | POA: Diagnosis not present

## 2014-06-04 DIAGNOSIS — M4645 Discitis, unspecified, thoracolumbar region: Secondary | ICD-10-CM

## 2014-06-04 DIAGNOSIS — R05 Cough: Secondary | ICD-10-CM | POA: Diagnosis not present

## 2014-06-04 DIAGNOSIS — M4646 Discitis, unspecified, lumbar region: Secondary | ICD-10-CM

## 2014-06-05 DIAGNOSIS — J45998 Other asthma: Secondary | ICD-10-CM | POA: Diagnosis not present

## 2014-06-05 DIAGNOSIS — M519 Unspecified thoracic, thoracolumbar and lumbosacral intervertebral disc disorder: Secondary | ICD-10-CM | POA: Diagnosis not present

## 2014-06-05 DIAGNOSIS — A047 Enterocolitis due to Clostridium difficile: Secondary | ICD-10-CM | POA: Diagnosis not present

## 2014-06-05 DIAGNOSIS — R05 Cough: Secondary | ICD-10-CM | POA: Diagnosis not present

## 2014-06-10 DIAGNOSIS — A047 Enterocolitis due to Clostridium difficile: Secondary | ICD-10-CM | POA: Diagnosis not present

## 2014-06-10 DIAGNOSIS — J45998 Other asthma: Secondary | ICD-10-CM | POA: Diagnosis not present

## 2014-06-10 DIAGNOSIS — R05 Cough: Secondary | ICD-10-CM | POA: Diagnosis not present

## 2014-06-10 DIAGNOSIS — M519 Unspecified thoracic, thoracolumbar and lumbosacral intervertebral disc disorder: Secondary | ICD-10-CM | POA: Diagnosis not present

## 2014-06-21 DIAGNOSIS — M519 Unspecified thoracic, thoracolumbar and lumbosacral intervertebral disc disorder: Secondary | ICD-10-CM | POA: Diagnosis not present

## 2014-06-21 DIAGNOSIS — A047 Enterocolitis due to Clostridium difficile: Secondary | ICD-10-CM | POA: Diagnosis not present

## 2014-06-21 DIAGNOSIS — B009 Herpesviral infection, unspecified: Secondary | ICD-10-CM | POA: Diagnosis not present

## 2014-06-21 DIAGNOSIS — B360 Pityriasis versicolor: Secondary | ICD-10-CM | POA: Diagnosis not present

## 2014-06-23 DIAGNOSIS — B360 Pityriasis versicolor: Secondary | ICD-10-CM | POA: Diagnosis not present

## 2014-06-23 DIAGNOSIS — B009 Herpesviral infection, unspecified: Secondary | ICD-10-CM | POA: Diagnosis not present

## 2014-06-23 DIAGNOSIS — M519 Unspecified thoracic, thoracolumbar and lumbosacral intervertebral disc disorder: Secondary | ICD-10-CM | POA: Diagnosis not present

## 2014-06-23 DIAGNOSIS — A047 Enterocolitis due to Clostridium difficile: Secondary | ICD-10-CM | POA: Diagnosis not present

## 2014-06-24 ENCOUNTER — Telehealth: Payer: Self-pay | Admitting: Internal Medicine

## 2014-06-24 DIAGNOSIS — K760 Fatty (change of) liver, not elsewhere classified: Secondary | ICD-10-CM | POA: Diagnosis not present

## 2014-06-24 DIAGNOSIS — E785 Hyperlipidemia, unspecified: Secondary | ICD-10-CM | POA: Diagnosis not present

## 2014-06-24 DIAGNOSIS — M519 Unspecified thoracic, thoracolumbar and lumbosacral intervertebral disc disorder: Secondary | ICD-10-CM | POA: Diagnosis not present

## 2014-06-24 DIAGNOSIS — A047 Enterocolitis due to Clostridium difficile: Secondary | ICD-10-CM | POA: Diagnosis not present

## 2014-06-24 DIAGNOSIS — L271 Localized skin eruption due to drugs and medicaments taken internally: Secondary | ICD-10-CM | POA: Diagnosis not present

## 2014-06-24 DIAGNOSIS — B360 Pityriasis versicolor: Secondary | ICD-10-CM | POA: Diagnosis not present

## 2014-06-24 MED ORDER — DAPTOMYCIN 500 MG IV SOLR: INTRAVENOUS | Status: DC

## 2014-06-24 NOTE — Telephone Encounter (Signed)
I received a phone call today from the PA and [name] is skilled nursing facility in [place], [place]. [place] recently developed a pruritic red rash scattered in a generalized fashion. She's been treated for the past 5 days with fluconazole for possible fungal superinfection but is worsening today. I suggested stopping her vancomycin and starting daptomycin 6 mg per kilogram IV. She will continue ceftriaxone for now. She is also on oral vancomycin for C. difficile colitis but since it is not absorbed this should not cause any systemic reaction if her rash is due to IV vancomycin.

## 2014-06-25 DIAGNOSIS — A047 Enterocolitis due to Clostridium difficile: Secondary | ICD-10-CM | POA: Diagnosis not present

## 2014-06-25 DIAGNOSIS — M519 Unspecified thoracic, thoracolumbar and lumbosacral intervertebral disc disorder: Secondary | ICD-10-CM | POA: Diagnosis not present

## 2014-06-25 DIAGNOSIS — L271 Localized skin eruption due to drugs and medicaments taken internally: Secondary | ICD-10-CM | POA: Diagnosis not present

## 2014-06-25 DIAGNOSIS — E785 Hyperlipidemia, unspecified: Secondary | ICD-10-CM | POA: Diagnosis not present

## 2014-06-25 DIAGNOSIS — B360 Pityriasis versicolor: Secondary | ICD-10-CM | POA: Diagnosis not present

## 2014-06-25 DIAGNOSIS — K760 Fatty (change of) liver, not elsewhere classified: Secondary | ICD-10-CM | POA: Diagnosis not present

## 2014-06-26 ENCOUNTER — Other Ambulatory Visit: Payer: Medicare Other

## 2014-06-26 ENCOUNTER — Inpatient Hospital Stay: Admission: RE | Admit: 2014-06-26 | Payer: Medicare Other | Source: Ambulatory Visit

## 2014-06-26 DIAGNOSIS — M519 Unspecified thoracic, thoracolumbar and lumbosacral intervertebral disc disorder: Secondary | ICD-10-CM | POA: Diagnosis not present

## 2014-06-26 DIAGNOSIS — B360 Pityriasis versicolor: Secondary | ICD-10-CM | POA: Diagnosis not present

## 2014-06-26 DIAGNOSIS — L271 Localized skin eruption due to drugs and medicaments taken internally: Secondary | ICD-10-CM | POA: Diagnosis not present

## 2014-06-26 DIAGNOSIS — A047 Enterocolitis due to Clostridium difficile: Secondary | ICD-10-CM | POA: Diagnosis not present

## 2014-06-26 DIAGNOSIS — K760 Fatty (change of) liver, not elsewhere classified: Secondary | ICD-10-CM | POA: Diagnosis not present

## 2014-06-26 DIAGNOSIS — E785 Hyperlipidemia, unspecified: Secondary | ICD-10-CM | POA: Diagnosis not present

## 2014-06-27 LAB — AFB CULTURE WITH SMEAR (NOT AT ARMC): Acid Fast Smear: NONE SEEN

## 2014-06-30 DIAGNOSIS — A047 Enterocolitis due to Clostridium difficile: Secondary | ICD-10-CM | POA: Diagnosis not present

## 2014-06-30 DIAGNOSIS — L271 Localized skin eruption due to drugs and medicaments taken internally: Secondary | ICD-10-CM | POA: Diagnosis not present

## 2014-06-30 DIAGNOSIS — L282 Other prurigo: Secondary | ICD-10-CM | POA: Diagnosis not present

## 2014-06-30 DIAGNOSIS — M519 Unspecified thoracic, thoracolumbar and lumbosacral intervertebral disc disorder: Secondary | ICD-10-CM | POA: Diagnosis not present

## 2014-07-01 DIAGNOSIS — M419 Scoliosis, unspecified: Secondary | ICD-10-CM | POA: Diagnosis not present

## 2014-07-01 DIAGNOSIS — M47817 Spondylosis without myelopathy or radiculopathy, lumbosacral region: Secondary | ICD-10-CM | POA: Diagnosis not present

## 2014-07-01 DIAGNOSIS — L282 Other prurigo: Secondary | ICD-10-CM | POA: Diagnosis not present

## 2014-07-01 DIAGNOSIS — M4624 Osteomyelitis of vertebra, thoracic region: Secondary | ICD-10-CM | POA: Diagnosis not present

## 2014-07-01 DIAGNOSIS — D1809 Hemangioma of other sites: Secondary | ICD-10-CM | POA: Diagnosis not present

## 2014-07-01 DIAGNOSIS — A047 Enterocolitis due to Clostridium difficile: Secondary | ICD-10-CM | POA: Diagnosis not present

## 2014-07-01 DIAGNOSIS — M519 Unspecified thoracic, thoracolumbar and lumbosacral intervertebral disc disorder: Secondary | ICD-10-CM | POA: Diagnosis not present

## 2014-07-01 DIAGNOSIS — Z9889 Other specified postprocedural states: Secondary | ICD-10-CM | POA: Diagnosis not present

## 2014-07-01 DIAGNOSIS — M47816 Spondylosis without myelopathy or radiculopathy, lumbar region: Secondary | ICD-10-CM | POA: Diagnosis not present

## 2014-07-01 DIAGNOSIS — L271 Localized skin eruption due to drugs and medicaments taken internally: Secondary | ICD-10-CM | POA: Diagnosis not present

## 2014-07-01 DIAGNOSIS — M5136 Other intervertebral disc degeneration, lumbar region: Secondary | ICD-10-CM | POA: Diagnosis not present

## 2014-07-02 ENCOUNTER — Other Ambulatory Visit (HOSPITAL_COMMUNITY): Payer: Medicare Other

## 2014-07-03 DIAGNOSIS — L282 Other prurigo: Secondary | ICD-10-CM | POA: Diagnosis not present

## 2014-07-03 DIAGNOSIS — M519 Unspecified thoracic, thoracolumbar and lumbosacral intervertebral disc disorder: Secondary | ICD-10-CM | POA: Diagnosis not present

## 2014-07-03 DIAGNOSIS — L271 Localized skin eruption due to drugs and medicaments taken internally: Secondary | ICD-10-CM | POA: Diagnosis not present

## 2014-07-03 DIAGNOSIS — A047 Enterocolitis due to Clostridium difficile: Secondary | ICD-10-CM | POA: Diagnosis not present

## 2014-07-08 DIAGNOSIS — L282 Other prurigo: Secondary | ICD-10-CM | POA: Diagnosis not present

## 2014-07-08 DIAGNOSIS — R600 Localized edema: Secondary | ICD-10-CM | POA: Diagnosis not present

## 2014-07-08 DIAGNOSIS — E039 Hypothyroidism, unspecified: Secondary | ICD-10-CM | POA: Diagnosis not present

## 2014-07-09 ENCOUNTER — Inpatient Hospital Stay: Payer: Medicare Other | Admitting: Internal Medicine

## 2014-07-10 DIAGNOSIS — L271 Localized skin eruption due to drugs and medicaments taken internally: Secondary | ICD-10-CM | POA: Diagnosis not present

## 2014-07-10 DIAGNOSIS — J45998 Other asthma: Secondary | ICD-10-CM | POA: Diagnosis not present

## 2014-07-10 DIAGNOSIS — R600 Localized edema: Secondary | ICD-10-CM | POA: Diagnosis not present

## 2014-07-10 DIAGNOSIS — J309 Allergic rhinitis, unspecified: Secondary | ICD-10-CM | POA: Diagnosis not present

## 2014-07-15 DIAGNOSIS — J45998 Other asthma: Secondary | ICD-10-CM | POA: Diagnosis not present

## 2014-07-15 DIAGNOSIS — L282 Other prurigo: Secondary | ICD-10-CM | POA: Diagnosis not present

## 2014-07-15 DIAGNOSIS — A047 Enterocolitis due to Clostridium difficile: Secondary | ICD-10-CM | POA: Diagnosis not present

## 2014-07-17 ENCOUNTER — Other Ambulatory Visit: Payer: Medicare Other

## 2014-07-21 ENCOUNTER — Ambulatory Visit (INDEPENDENT_AMBULATORY_CARE_PROVIDER_SITE_OTHER): Payer: Medicare Other | Admitting: Infectious Diseases

## 2014-07-21 ENCOUNTER — Telehealth: Payer: Self-pay | Admitting: *Deleted

## 2014-07-21 ENCOUNTER — Encounter: Payer: Self-pay | Admitting: Infectious Diseases

## 2014-07-21 VITALS — BP 120/82 | HR 76 | Temp 98.5°F

## 2014-07-21 DIAGNOSIS — A0472 Enterocolitis due to Clostridium difficile, not specified as recurrent: Secondary | ICD-10-CM

## 2014-07-21 DIAGNOSIS — G8220 Paraplegia, unspecified: Secondary | ICD-10-CM | POA: Diagnosis not present

## 2014-07-21 DIAGNOSIS — A047 Enterocolitis due to Clostridium difficile: Secondary | ICD-10-CM | POA: Diagnosis not present

## 2014-07-21 DIAGNOSIS — M869 Osteomyelitis, unspecified: Secondary | ICD-10-CM

## 2014-07-21 DIAGNOSIS — M7989 Other specified soft tissue disorders: Secondary | ICD-10-CM | POA: Insufficient documentation

## 2014-07-21 DIAGNOSIS — G839 Paralytic syndrome, unspecified: Secondary | ICD-10-CM | POA: Diagnosis not present

## 2014-07-21 DIAGNOSIS — Z7401 Bed confinement status: Secondary | ICD-10-CM | POA: Diagnosis not present

## 2014-07-21 LAB — C-REACTIVE PROTEIN: CRP: 1.2 mg/dL — ABNORMAL HIGH (ref <0.60)

## 2014-07-21 MED ORDER — DOXYCYCLINE HYCLATE 100 MG PO TABS: 100.0000 mg | ORAL_TABLET | Freq: Two times a day (BID) | ORAL | Status: DC

## 2014-07-21 NOTE — Assessment & Plan Note (Signed)
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

## 2014-07-21 NOTE — Assessment & Plan Note (Signed)
Her "sepsis" has resolved, she is improved. Will pull her PIC and check her ESR and CRP.  I am not sure how to interpret her MRI findings, unfortunately they were done in separate systems and appears that films were not compared.  Will try to get her films for comparison.  Will start her on doxy, have her rtc in 3 months, consider repeat imaging then.

## 2014-07-21 NOTE — Assessment & Plan Note (Signed)
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

## 2014-07-21 NOTE — Assessment & Plan Note (Signed)
This appears to have resolved.  Will watch if this returns while on doxy.

## 2014-07-21 NOTE — Telephone Encounter (Signed)
Spoke to [place] at [employer] [facility] [phone] and requested a CD copy of patient's lumbar and thoracic MRI. Provided this clinic's address and it will be mailed tomorrow. [name]

## 2014-07-21 NOTE — Progress Notes (Signed)
RN received verbal order to discontinue the patient's PICC line.  Patient identified with name and date of birth. PICC dressing removed, site unremarkable.  PICC line removed using sterile procedure @ 1530. PICC length equal to that noted in patient's hospital chart of 42 cm. Sterile petroleum gauze + sterile 4X4 applied to PICC site, pressure applied for 10 minutes and covered with Medipore tape as a pressure dressing. Patient tolerated procedure without complaints.  Patient instructed to limit use of arm for 1 hour. Patient instructed that the pressure dressing should remain in place for 24 hours. Patient verbalized understanding of these instructions.

## 2014-07-21 NOTE — Assessment & Plan Note (Signed)
Will defer w/u of this to PCP. She states she had negative doppler.

## 2014-07-21 NOTE — Progress Notes (Signed)
   Subjective:    Patient ID: [name], female    DOB: [birth year], 69 y.o.   MRN: [id number]  HPI 69 yo M previously hospitalized 2-10 to 05-20-14 with paraplegia and L2-3 fluid collection and "destructive" process. She also had a fluid collection at T4-11. Her course was complicated by C diff. She had flouro guided aspirate of L2-3 on 2-18 that was negative for routine, afb. She was treated with oral vancomycin, IV vanco/ceftriaxone. She was planned to receive 6 weeks of anbx.  She was d/c to SNF and then changed to daptomycin/ceftriaxone, which she completed on 06-28-14. She was started on prednisone taper as well. She completed po vanco taper on 07-01-14.   She had repeat MRI on 07-01-14 which showed "extensive fluid collection in the thoracic spine from T3 through 11, probably intradural and likely related to previous hemorrhages and surgery.  This results in severe compression of the spinal cord throughout the spinal cord down to the T11 level. ..."this probably does not represent epidural abscess although I cannot be certain. 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 There is a disc destruction and a large amount of fluid within the disc space at this level. This could represent discitis/osteomyelitis. There is enhancement of the dura throughout the lumbar spine..." C/o allergies and sinuses.  No difference in her back, no pain. No further episodes of hypotension, muscle jerking.  Has been having firmer BM since she has been off anbx.  No problems with PIC line- no pain, no d/c. No redness or swelling.    Review of Systems  Constitutional: Negative for fever and chills.  Gastrointestinal: Negative for diarrhea and constipation.       Objective:   Physical Exam  Constitutional: She appears well-developed and well-nourished.    Cardiovascular: Normal rate, regular rhythm and normal heart sounds.   Pulmonary/Chest: Effort normal and breath sounds normal.  Abdominal: Soft. Bowel sounds are normal. She exhibits no distension. There is no tenderness.  Musculoskeletal:       Arms:      Legs:      Assessment & Plan:

## 2014-07-22 DIAGNOSIS — G061 Intraspinal abscess and granuloma: Secondary | ICD-10-CM | POA: Diagnosis not present

## 2014-07-22 DIAGNOSIS — R6521 Severe sepsis with septic shock: Secondary | ICD-10-CM | POA: Diagnosis not present

## 2014-07-22 DIAGNOSIS — N179 Acute kidney failure, unspecified: Secondary | ICD-10-CM | POA: Diagnosis not present

## 2014-07-22 DIAGNOSIS — D649 Anemia, unspecified: Secondary | ICD-10-CM | POA: Diagnosis not present

## 2014-07-22 DIAGNOSIS — A047 Enterocolitis due to Clostridium difficile: Secondary | ICD-10-CM | POA: Diagnosis not present

## 2014-07-22 DIAGNOSIS — E785 Hyperlipidemia, unspecified: Secondary | ICD-10-CM | POA: Diagnosis not present

## 2014-07-22 DIAGNOSIS — Z79899 Other long term (current) drug therapy: Secondary | ICD-10-CM | POA: Diagnosis not present

## 2014-07-22 DIAGNOSIS — L282 Other prurigo: Secondary | ICD-10-CM | POA: Diagnosis not present

## 2014-07-22 LAB — SEDIMENTATION RATE: SED RATE: 32 mm/h — AB (ref 0–30)

## 2014-07-24 DIAGNOSIS — I10 Essential (primary) hypertension: Secondary | ICD-10-CM | POA: Diagnosis not present

## 2014-07-24 DIAGNOSIS — E119 Type 2 diabetes mellitus without complications: Secondary | ICD-10-CM | POA: Diagnosis not present

## 2014-07-24 DIAGNOSIS — Z79899 Other long term (current) drug therapy: Secondary | ICD-10-CM | POA: Diagnosis not present

## 2014-07-24 DIAGNOSIS — D649 Anemia, unspecified: Secondary | ICD-10-CM | POA: Diagnosis not present

## 2014-07-29 DIAGNOSIS — R6521 Severe sepsis with septic shock: Secondary | ICD-10-CM | POA: Diagnosis not present

## 2014-07-29 DIAGNOSIS — Z79899 Other long term (current) drug therapy: Secondary | ICD-10-CM | POA: Diagnosis not present

## 2014-07-29 DIAGNOSIS — D649 Anemia, unspecified: Secondary | ICD-10-CM | POA: Diagnosis not present

## 2014-07-29 DIAGNOSIS — L282 Other prurigo: Secondary | ICD-10-CM | POA: Diagnosis not present

## 2014-07-29 DIAGNOSIS — A047 Enterocolitis due to Clostridium difficile: Secondary | ICD-10-CM | POA: Diagnosis not present

## 2014-07-29 DIAGNOSIS — G061 Intraspinal abscess and granuloma: Secondary | ICD-10-CM | POA: Diagnosis not present

## 2014-07-29 DIAGNOSIS — E785 Hyperlipidemia, unspecified: Secondary | ICD-10-CM | POA: Diagnosis not present

## 2014-07-29 DIAGNOSIS — D72829 Elevated white blood cell count, unspecified: Secondary | ICD-10-CM | POA: Diagnosis not present

## 2014-07-30 DIAGNOSIS — F419 Anxiety disorder, unspecified: Secondary | ICD-10-CM | POA: Diagnosis not present

## 2014-07-30 DIAGNOSIS — Z9889 Other specified postprocedural states: Secondary | ICD-10-CM | POA: Diagnosis not present

## 2014-07-30 DIAGNOSIS — M519 Unspecified thoracic, thoracolumbar and lumbosacral intervertebral disc disorder: Secondary | ICD-10-CM | POA: Diagnosis not present

## 2014-07-30 DIAGNOSIS — M545 Low back pain: Secondary | ICD-10-CM | POA: Diagnosis present

## 2014-07-30 DIAGNOSIS — D649 Anemia, unspecified: Secondary | ICD-10-CM | POA: Diagnosis not present

## 2014-07-30 DIAGNOSIS — N183 Chronic kidney disease, stage 3 (moderate): Secondary | ICD-10-CM | POA: Diagnosis not present

## 2014-07-30 DIAGNOSIS — I10 Essential (primary) hypertension: Secondary | ICD-10-CM | POA: Diagnosis not present

## 2014-07-30 DIAGNOSIS — M4645 Discitis, unspecified, thoracolumbar region: Secondary | ICD-10-CM | POA: Diagnosis not present

## 2014-07-30 DIAGNOSIS — B373 Candidiasis of vulva and vagina: Secondary | ICD-10-CM | POA: Diagnosis not present

## 2014-07-30 DIAGNOSIS — N179 Acute kidney failure, unspecified: Secondary | ICD-10-CM | POA: Diagnosis not present

## 2014-07-30 DIAGNOSIS — M79674 Pain in right toe(s): Secondary | ICD-10-CM | POA: Diagnosis not present

## 2014-07-30 DIAGNOSIS — R05 Cough: Secondary | ICD-10-CM | POA: Diagnosis not present

## 2014-07-30 DIAGNOSIS — K219 Gastro-esophageal reflux disease without esophagitis: Secondary | ICD-10-CM | POA: Diagnosis present

## 2014-07-30 DIAGNOSIS — R319 Hematuria, unspecified: Secondary | ICD-10-CM | POA: Diagnosis not present

## 2014-07-30 DIAGNOSIS — R110 Nausea: Secondary | ICD-10-CM | POA: Diagnosis not present

## 2014-07-30 DIAGNOSIS — G43909 Migraine, unspecified, not intractable, without status migrainosus: Secondary | ICD-10-CM | POA: Diagnosis not present

## 2014-07-30 DIAGNOSIS — R600 Localized edema: Secondary | ICD-10-CM | POA: Diagnosis not present

## 2014-07-30 DIAGNOSIS — T8351XD Infection and inflammatory reaction due to indwelling urinary catheter, subsequent encounter: Secondary | ICD-10-CM | POA: Diagnosis not present

## 2014-07-30 DIAGNOSIS — M4300 Spondylolysis, site unspecified: Secondary | ICD-10-CM | POA: Diagnosis not present

## 2014-07-30 DIAGNOSIS — N390 Urinary tract infection, site not specified: Secondary | ICD-10-CM | POA: Diagnosis not present

## 2014-07-30 DIAGNOSIS — Z6841 Body Mass Index (BMI) 40.0 and over, adult: Secondary | ICD-10-CM | POA: Diagnosis not present

## 2014-07-30 DIAGNOSIS — N12 Tubulo-interstitial nephritis, not specified as acute or chronic: Secondary | ICD-10-CM | POA: Diagnosis not present

## 2014-07-30 DIAGNOSIS — J9600 Acute respiratory failure, unspecified whether with hypoxia or hypercapnia: Secondary | ICD-10-CM | POA: Diagnosis not present

## 2014-07-30 DIAGNOSIS — I700 Atherosclerosis of aorta: Secondary | ICD-10-CM | POA: Diagnosis not present

## 2014-07-30 DIAGNOSIS — J45909 Unspecified asthma, uncomplicated: Secondary | ICD-10-CM | POA: Diagnosis present

## 2014-07-30 DIAGNOSIS — D72829 Elevated white blood cell count, unspecified: Secondary | ICD-10-CM | POA: Diagnosis not present

## 2014-07-30 DIAGNOSIS — I2510 Atherosclerotic heart disease of native coronary artery without angina pectoris: Secondary | ICD-10-CM | POA: Diagnosis not present

## 2014-07-30 DIAGNOSIS — L539 Erythematous condition, unspecified: Secondary | ICD-10-CM | POA: Diagnosis not present

## 2014-07-30 DIAGNOSIS — J189 Pneumonia, unspecified organism: Secondary | ICD-10-CM | POA: Diagnosis not present

## 2014-07-30 DIAGNOSIS — R6521 Severe sepsis with septic shock: Secondary | ICD-10-CM | POA: Diagnosis not present

## 2014-07-30 DIAGNOSIS — G061 Intraspinal abscess and granuloma: Secondary | ICD-10-CM | POA: Diagnosis not present

## 2014-07-30 DIAGNOSIS — L600 Ingrowing nail: Secondary | ICD-10-CM | POA: Diagnosis not present

## 2014-07-30 DIAGNOSIS — D509 Iron deficiency anemia, unspecified: Secondary | ICD-10-CM | POA: Diagnosis present

## 2014-07-30 DIAGNOSIS — E785 Hyperlipidemia, unspecified: Secondary | ICD-10-CM | POA: Diagnosis not present

## 2014-07-30 DIAGNOSIS — E871 Hypo-osmolality and hyponatremia: Secondary | ICD-10-CM | POA: Diagnosis not present

## 2014-07-30 DIAGNOSIS — L282 Other prurigo: Secondary | ICD-10-CM | POA: Diagnosis not present

## 2014-07-30 DIAGNOSIS — R197 Diarrhea, unspecified: Secondary | ICD-10-CM | POA: Diagnosis not present

## 2014-07-30 DIAGNOSIS — R6889 Other general symptoms and signs: Secondary | ICD-10-CM | POA: Diagnosis not present

## 2014-07-30 DIAGNOSIS — F339 Major depressive disorder, recurrent, unspecified: Secondary | ICD-10-CM | POA: Diagnosis not present

## 2014-07-30 DIAGNOSIS — E669 Obesity, unspecified: Secondary | ICD-10-CM | POA: Diagnosis not present

## 2014-07-30 DIAGNOSIS — A419 Sepsis, unspecified organism: Secondary | ICD-10-CM | POA: Diagnosis not present

## 2014-07-30 DIAGNOSIS — E119 Type 2 diabetes mellitus without complications: Secondary | ICD-10-CM | POA: Diagnosis present

## 2014-07-30 DIAGNOSIS — R6883 Chills (without fever): Secondary | ICD-10-CM | POA: Diagnosis not present

## 2014-07-30 DIAGNOSIS — G9340 Encephalopathy, unspecified: Secondary | ICD-10-CM | POA: Diagnosis not present

## 2014-07-30 DIAGNOSIS — M4646 Discitis, unspecified, lumbar region: Secondary | ICD-10-CM | POA: Diagnosis not present

## 2014-07-30 DIAGNOSIS — A047 Enterocolitis due to Clostridium difficile: Secondary | ICD-10-CM | POA: Diagnosis not present

## 2014-07-30 DIAGNOSIS — L89150 Pressure ulcer of sacral region, unstageable: Secondary | ICD-10-CM | POA: Diagnosis not present

## 2014-07-30 DIAGNOSIS — J449 Chronic obstructive pulmonary disease, unspecified: Secondary | ICD-10-CM | POA: Diagnosis present

## 2014-07-30 DIAGNOSIS — R509 Fever, unspecified: Secondary | ICD-10-CM | POA: Diagnosis not present

## 2014-07-30 DIAGNOSIS — E039 Hypothyroidism, unspecified: Secondary | ICD-10-CM | POA: Diagnosis present

## 2014-07-30 DIAGNOSIS — T8351XA Infection and inflammatory reaction due to indwelling urinary catheter, initial encounter: Secondary | ICD-10-CM | POA: Diagnosis not present

## 2014-07-30 DIAGNOSIS — B377 Candidal sepsis: Secondary | ICD-10-CM | POA: Diagnosis not present

## 2014-07-30 DIAGNOSIS — L271 Localized skin eruption due to drugs and medicaments taken internally: Secondary | ICD-10-CM | POA: Diagnosis not present

## 2014-07-30 DIAGNOSIS — L03115 Cellulitis of right lower limb: Secondary | ICD-10-CM | POA: Diagnosis not present

## 2014-07-30 DIAGNOSIS — M109 Gout, unspecified: Secondary | ICD-10-CM | POA: Diagnosis present

## 2014-07-30 DIAGNOSIS — Z87891 Personal history of nicotine dependence: Secondary | ICD-10-CM | POA: Diagnosis not present

## 2014-07-30 DIAGNOSIS — Z79899 Other long term (current) drug therapy: Secondary | ICD-10-CM | POA: Diagnosis not present

## 2014-07-30 DIAGNOSIS — Z7982 Long term (current) use of aspirin: Secondary | ICD-10-CM | POA: Diagnosis not present

## 2014-07-30 DIAGNOSIS — G894 Chronic pain syndrome: Secondary | ICD-10-CM | POA: Diagnosis present

## 2014-07-30 DIAGNOSIS — F319 Bipolar disorder, unspecified: Secondary | ICD-10-CM | POA: Diagnosis present

## 2014-07-30 DIAGNOSIS — M7989 Other specified soft tissue disorders: Secondary | ICD-10-CM | POA: Diagnosis not present

## 2014-07-30 DIAGNOSIS — G8929 Other chronic pain: Secondary | ICD-10-CM | POA: Diagnosis present

## 2014-07-30 DIAGNOSIS — G8220 Paraplegia, unspecified: Secondary | ICD-10-CM | POA: Diagnosis present

## 2014-07-30 DIAGNOSIS — M869 Osteomyelitis, unspecified: Secondary | ICD-10-CM | POA: Diagnosis present

## 2014-08-01 DIAGNOSIS — G061 Intraspinal abscess and granuloma: Secondary | ICD-10-CM | POA: Diagnosis not present

## 2014-08-01 DIAGNOSIS — D649 Anemia, unspecified: Secondary | ICD-10-CM | POA: Diagnosis not present

## 2014-08-01 DIAGNOSIS — L282 Other prurigo: Secondary | ICD-10-CM | POA: Diagnosis not present

## 2014-08-01 DIAGNOSIS — D72829 Elevated white blood cell count, unspecified: Secondary | ICD-10-CM | POA: Diagnosis not present

## 2014-08-01 DIAGNOSIS — A047 Enterocolitis due to Clostridium difficile: Secondary | ICD-10-CM | POA: Diagnosis not present

## 2014-08-02 DIAGNOSIS — L03115 Cellulitis of right lower limb: Secondary | ICD-10-CM | POA: Diagnosis not present

## 2014-08-02 DIAGNOSIS — A047 Enterocolitis due to Clostridium difficile: Secondary | ICD-10-CM | POA: Diagnosis not present

## 2014-08-02 DIAGNOSIS — M109 Gout, unspecified: Secondary | ICD-10-CM | POA: Diagnosis not present

## 2014-08-02 DIAGNOSIS — R600 Localized edema: Secondary | ICD-10-CM | POA: Diagnosis not present

## 2014-08-02 DIAGNOSIS — D72829 Elevated white blood cell count, unspecified: Secondary | ICD-10-CM | POA: Diagnosis not present

## 2014-08-02 DIAGNOSIS — G061 Intraspinal abscess and granuloma: Secondary | ICD-10-CM | POA: Diagnosis not present

## 2014-08-02 DIAGNOSIS — R110 Nausea: Secondary | ICD-10-CM | POA: Diagnosis not present

## 2014-08-05 DIAGNOSIS — G061 Intraspinal abscess and granuloma: Secondary | ICD-10-CM | POA: Diagnosis not present

## 2014-08-05 DIAGNOSIS — L271 Localized skin eruption due to drugs and medicaments taken internally: Secondary | ICD-10-CM | POA: Diagnosis not present

## 2014-08-05 DIAGNOSIS — G43909 Migraine, unspecified, not intractable, without status migrainosus: Secondary | ICD-10-CM | POA: Diagnosis not present

## 2014-08-05 DIAGNOSIS — D72829 Elevated white blood cell count, unspecified: Secondary | ICD-10-CM | POA: Diagnosis not present

## 2014-08-05 DIAGNOSIS — M109 Gout, unspecified: Secondary | ICD-10-CM | POA: Diagnosis not present

## 2014-08-05 DIAGNOSIS — A047 Enterocolitis due to Clostridium difficile: Secondary | ICD-10-CM | POA: Diagnosis not present

## 2014-08-05 DIAGNOSIS — L539 Erythematous condition, unspecified: Secondary | ICD-10-CM | POA: Diagnosis not present

## 2014-08-07 DIAGNOSIS — L271 Localized skin eruption due to drugs and medicaments taken internally: Secondary | ICD-10-CM | POA: Diagnosis not present

## 2014-08-11 DIAGNOSIS — G43909 Migraine, unspecified, not intractable, without status migrainosus: Secondary | ICD-10-CM | POA: Diagnosis not present

## 2014-08-11 DIAGNOSIS — L539 Erythematous condition, unspecified: Secondary | ICD-10-CM | POA: Diagnosis not present

## 2014-08-11 DIAGNOSIS — G061 Intraspinal abscess and granuloma: Secondary | ICD-10-CM | POA: Diagnosis not present

## 2014-08-11 DIAGNOSIS — A047 Enterocolitis due to Clostridium difficile: Secondary | ICD-10-CM | POA: Diagnosis not present

## 2014-08-11 DIAGNOSIS — L271 Localized skin eruption due to drugs and medicaments taken internally: Secondary | ICD-10-CM | POA: Diagnosis not present

## 2014-08-11 DIAGNOSIS — M109 Gout, unspecified: Secondary | ICD-10-CM | POA: Diagnosis not present

## 2014-08-11 DIAGNOSIS — D72829 Elevated white blood cell count, unspecified: Secondary | ICD-10-CM | POA: Diagnosis not present

## 2014-08-13 DIAGNOSIS — G061 Intraspinal abscess and granuloma: Secondary | ICD-10-CM | POA: Diagnosis not present

## 2014-08-13 DIAGNOSIS — L271 Localized skin eruption due to drugs and medicaments taken internally: Secondary | ICD-10-CM | POA: Diagnosis not present

## 2014-08-13 DIAGNOSIS — L539 Erythematous condition, unspecified: Secondary | ICD-10-CM | POA: Diagnosis not present

## 2014-08-13 DIAGNOSIS — G43909 Migraine, unspecified, not intractable, without status migrainosus: Secondary | ICD-10-CM | POA: Diagnosis not present

## 2014-08-13 DIAGNOSIS — A047 Enterocolitis due to Clostridium difficile: Secondary | ICD-10-CM | POA: Diagnosis not present

## 2014-08-13 DIAGNOSIS — D72829 Elevated white blood cell count, unspecified: Secondary | ICD-10-CM | POA: Diagnosis not present

## 2014-08-13 DIAGNOSIS — M109 Gout, unspecified: Secondary | ICD-10-CM | POA: Diagnosis not present

## 2014-08-16 DIAGNOSIS — N390 Urinary tract infection, site not specified: Secondary | ICD-10-CM | POA: Diagnosis not present

## 2014-08-16 DIAGNOSIS — M109 Gout, unspecified: Secondary | ICD-10-CM | POA: Diagnosis not present

## 2014-08-16 DIAGNOSIS — N183 Chronic kidney disease, stage 3 (moderate): Secondary | ICD-10-CM | POA: Diagnosis not present

## 2014-08-16 DIAGNOSIS — I2510 Atherosclerotic heart disease of native coronary artery without angina pectoris: Secondary | ICD-10-CM | POA: Diagnosis not present

## 2014-08-18 DIAGNOSIS — D72829 Elevated white blood cell count, unspecified: Secondary | ICD-10-CM | POA: Diagnosis not present

## 2014-08-18 DIAGNOSIS — L271 Localized skin eruption due to drugs and medicaments taken internally: Secondary | ICD-10-CM | POA: Diagnosis not present

## 2014-08-18 DIAGNOSIS — F419 Anxiety disorder, unspecified: Secondary | ICD-10-CM | POA: Diagnosis not present

## 2014-08-18 DIAGNOSIS — A047 Enterocolitis due to Clostridium difficile: Secondary | ICD-10-CM | POA: Diagnosis not present

## 2014-08-18 DIAGNOSIS — N390 Urinary tract infection, site not specified: Secondary | ICD-10-CM | POA: Diagnosis not present

## 2014-08-20 DIAGNOSIS — L600 Ingrowing nail: Secondary | ICD-10-CM | POA: Diagnosis not present

## 2014-08-20 DIAGNOSIS — M79674 Pain in right toe(s): Secondary | ICD-10-CM | POA: Diagnosis not present

## 2014-08-21 DIAGNOSIS — D72829 Elevated white blood cell count, unspecified: Secondary | ICD-10-CM | POA: Diagnosis not present

## 2014-08-21 DIAGNOSIS — N390 Urinary tract infection, site not specified: Secondary | ICD-10-CM | POA: Diagnosis not present

## 2014-08-21 DIAGNOSIS — A047 Enterocolitis due to Clostridium difficile: Secondary | ICD-10-CM | POA: Diagnosis not present

## 2014-08-21 DIAGNOSIS — F419 Anxiety disorder, unspecified: Secondary | ICD-10-CM | POA: Diagnosis not present

## 2014-08-21 DIAGNOSIS — L271 Localized skin eruption due to drugs and medicaments taken internally: Secondary | ICD-10-CM | POA: Diagnosis not present

## 2014-08-26 DIAGNOSIS — D72829 Elevated white blood cell count, unspecified: Secondary | ICD-10-CM | POA: Diagnosis not present

## 2014-08-26 DIAGNOSIS — M109 Gout, unspecified: Secondary | ICD-10-CM | POA: Diagnosis not present

## 2014-08-26 DIAGNOSIS — R110 Nausea: Secondary | ICD-10-CM | POA: Diagnosis not present

## 2014-08-26 DIAGNOSIS — A047 Enterocolitis due to Clostridium difficile: Secondary | ICD-10-CM | POA: Diagnosis not present

## 2014-08-26 DIAGNOSIS — L03115 Cellulitis of right lower limb: Secondary | ICD-10-CM | POA: Diagnosis not present

## 2014-08-26 DIAGNOSIS — G061 Intraspinal abscess and granuloma: Secondary | ICD-10-CM | POA: Diagnosis not present

## 2014-08-26 DIAGNOSIS — R600 Localized edema: Secondary | ICD-10-CM | POA: Diagnosis not present

## 2014-09-07 DIAGNOSIS — R509 Fever, unspecified: Secondary | ICD-10-CM | POA: Diagnosis not present

## 2014-09-07 DIAGNOSIS — D72829 Elevated white blood cell count, unspecified: Secondary | ICD-10-CM | POA: Diagnosis not present

## 2014-09-07 DIAGNOSIS — M519 Unspecified thoracic, thoracolumbar and lumbosacral intervertebral disc disorder: Secondary | ICD-10-CM | POA: Diagnosis not present

## 2014-09-07 DIAGNOSIS — N390 Urinary tract infection, site not specified: Secondary | ICD-10-CM | POA: Diagnosis not present

## 2014-09-08 ENCOUNTER — Emergency Department (HOSPITAL_COMMUNITY): Payer: Medicare Other

## 2014-09-08 ENCOUNTER — Inpatient Hospital Stay (HOSPITAL_COMMUNITY)
Admission: EM | Admit: 2014-09-08 | Discharge: 2014-09-10 | DRG: 872 | Disposition: A | Discharge status: Skilled Nursing Facility | Payer: Medicare Other | Attending: Internal Medicine | Admitting: Internal Medicine

## 2014-09-08 ENCOUNTER — Encounter (HOSPITAL_COMMUNITY): Payer: Self-pay | Admitting: *Deleted

## 2014-09-08 DIAGNOSIS — Z79899 Other long term (current) drug therapy: Secondary | ICD-10-CM | POA: Diagnosis not present

## 2014-09-08 DIAGNOSIS — M545 Low back pain: Secondary | ICD-10-CM | POA: Diagnosis present

## 2014-09-08 DIAGNOSIS — Z7982 Long term (current) use of aspirin: Secondary | ICD-10-CM

## 2014-09-08 DIAGNOSIS — F329 Major depressive disorder, single episode, unspecified: Secondary | ICD-10-CM | POA: Diagnosis present

## 2014-09-08 DIAGNOSIS — R509 Fever, unspecified: Secondary | ICD-10-CM | POA: Diagnosis not present

## 2014-09-08 DIAGNOSIS — G894 Chronic pain syndrome: Secondary | ICD-10-CM | POA: Diagnosis present

## 2014-09-08 DIAGNOSIS — A047 Enterocolitis due to Clostridium difficile: Secondary | ICD-10-CM | POA: Diagnosis present

## 2014-09-08 DIAGNOSIS — T8351XA Infection and inflammatory reaction due to indwelling urinary catheter, initial encounter: Secondary | ICD-10-CM

## 2014-09-08 DIAGNOSIS — N390 Urinary tract infection, site not specified: Secondary | ICD-10-CM | POA: Diagnosis present

## 2014-09-08 DIAGNOSIS — Z9889 Other specified postprocedural states: Secondary | ICD-10-CM

## 2014-09-08 DIAGNOSIS — J45909 Unspecified asthma, uncomplicated: Secondary | ICD-10-CM | POA: Diagnosis present

## 2014-09-08 DIAGNOSIS — T8351XD Infection and inflammatory reaction due to indwelling urinary catheter, subsequent encounter: Secondary | ICD-10-CM | POA: Diagnosis not present

## 2014-09-08 DIAGNOSIS — E119 Type 2 diabetes mellitus without complications: Secondary | ICD-10-CM | POA: Diagnosis present

## 2014-09-08 DIAGNOSIS — E039 Hypothyroidism, unspecified: Secondary | ICD-10-CM | POA: Diagnosis present

## 2014-09-08 DIAGNOSIS — R6883 Chills (without fever): Secondary | ICD-10-CM | POA: Diagnosis not present

## 2014-09-08 DIAGNOSIS — Z6841 Body Mass Index (BMI) 40.0 and over, adult: Secondary | ICD-10-CM | POA: Diagnosis not present

## 2014-09-08 DIAGNOSIS — G8220 Paraplegia, unspecified: Secondary | ICD-10-CM | POA: Diagnosis present

## 2014-09-08 DIAGNOSIS — Z960 Presence of urogenital implants: Secondary | ICD-10-CM

## 2014-09-08 DIAGNOSIS — D509 Iron deficiency anemia, unspecified: Secondary | ICD-10-CM | POA: Diagnosis present

## 2014-09-08 DIAGNOSIS — Z743 Need for continuous supervision: Secondary | ICD-10-CM | POA: Diagnosis not present

## 2014-09-08 DIAGNOSIS — M869 Osteomyelitis, unspecified: Secondary | ICD-10-CM | POA: Diagnosis present

## 2014-09-08 DIAGNOSIS — M109 Gout, unspecified: Secondary | ICD-10-CM | POA: Diagnosis present

## 2014-09-08 DIAGNOSIS — F419 Anxiety disorder, unspecified: Secondary | ICD-10-CM | POA: Diagnosis present

## 2014-09-08 DIAGNOSIS — G8929 Other chronic pain: Secondary | ICD-10-CM | POA: Diagnosis present

## 2014-09-08 DIAGNOSIS — J449 Chronic obstructive pulmonary disease, unspecified: Secondary | ICD-10-CM | POA: Diagnosis present

## 2014-09-08 DIAGNOSIS — F319 Bipolar disorder, unspecified: Secondary | ICD-10-CM | POA: Diagnosis present

## 2014-09-08 DIAGNOSIS — K219 Gastro-esophageal reflux disease without esophagitis: Secondary | ICD-10-CM | POA: Diagnosis present

## 2014-09-08 DIAGNOSIS — R279 Unspecified lack of coordination: Secondary | ICD-10-CM | POA: Diagnosis not present

## 2014-09-08 DIAGNOSIS — A419 Sepsis, unspecified organism: Principal | ICD-10-CM | POA: Diagnosis present

## 2014-09-08 DIAGNOSIS — Z87891 Personal history of nicotine dependence: Secondary | ICD-10-CM | POA: Diagnosis not present

## 2014-09-08 DIAGNOSIS — F32A Depression, unspecified: Secondary | ICD-10-CM | POA: Diagnosis present

## 2014-09-08 DIAGNOSIS — I10 Essential (primary) hypertension: Secondary | ICD-10-CM | POA: Diagnosis not present

## 2014-09-08 DIAGNOSIS — Z978 Presence of other specified devices: Secondary | ICD-10-CM

## 2014-09-08 DIAGNOSIS — G061 Intraspinal abscess and granuloma: Secondary | ICD-10-CM | POA: Diagnosis present

## 2014-09-08 LAB — CBC WITH DIFFERENTIAL/PLATELET
BASOS PCT: 0 % (ref 0–1)
Basophils Absolute: 0 10*3/uL (ref 0.0–0.1)
EOS ABS: 0 10*3/uL (ref 0.0–0.7)
Eosinophils Relative: 0 % (ref 0–5)
HEMATOCRIT: 38 % (ref 36.0–46.0)
HEMOGLOBIN: 11.7 g/dL — AB (ref 12.0–15.0)
Lymphocytes Relative: 8 % — ABNORMAL LOW (ref 12–46)
Lymphs Abs: 1.1 10*3/uL (ref 0.7–4.0)
MCH: 25.7 pg — ABNORMAL LOW (ref 26.0–34.0)
MCHC: 30.8 g/dL (ref 30.0–36.0)
MCV: 83.3 fL (ref 78.0–100.0)
MONO ABS: 0.6 10*3/uL (ref 0.1–1.0)
Monocytes Relative: 4 % (ref 3–12)
Neutro Abs: 12.3 10*3/uL — ABNORMAL HIGH (ref 1.7–7.7)
Neutrophils Relative %: 88 % — ABNORMAL HIGH (ref 43–77)
Platelets: 189 10*3/uL (ref 150–400)
RBC: 4.56 MIL/uL (ref 3.87–5.11)
RDW: 16.1 % — AB (ref 11.5–15.5)
WBC: 14 10*3/uL — ABNORMAL HIGH (ref 4.0–10.5)

## 2014-09-08 LAB — COMPREHENSIVE METABOLIC PANEL
ALK PHOS: 61 U/L (ref 38–126)
ALT: 18 U/L (ref 14–54)
ANION GAP: 11 (ref 5–15)
AST: 25 U/L (ref 15–41)
Albumin: 3.3 g/dL — ABNORMAL LOW (ref 3.5–5.0)
BUN: 15 mg/dL (ref 6–20)
CO2: 27 mmol/L (ref 22–32)
Calcium: 8.4 mg/dL — ABNORMAL LOW (ref 8.9–10.3)
Chloride: 96 mmol/L — ABNORMAL LOW (ref 101–111)
Creatinine, Ser: 0.51 mg/dL (ref 0.44–1.00)
GFR calc Af Amer: >60 mL/min (ref >60)
Glucose, Bld: 213 mg/dL — ABNORMAL HIGH (ref 65–99)
POTASSIUM: 4.3 mmol/L (ref 3.5–5.1)
SODIUM: 134 mmol/L — AB (ref 135–145)
TOTAL PROTEIN: 7.2 g/dL (ref 6.5–8.1)
Total Bilirubin: 0.9 mg/dL (ref 0.3–1.2)

## 2014-09-08 LAB — URINALYSIS, ROUTINE W REFLEX MICROSCOPIC
Bilirubin Urine: NEGATIVE
GLUCOSE, UA: NEGATIVE mg/dL
Nitrite: POSITIVE — AB
Protein, ur: 100 mg/dL — AB
SPECIFIC GRAVITY, URINE: 1.015 (ref 1.005–1.030)
UROBILINOGEN UA: 0.2 mg/dL (ref 0.0–1.0)
pH: 7 (ref 5.0–8.0)

## 2014-09-08 LAB — PROTIME-INR
INR: 1.32 (ref 0.00–1.49)
PROTHROMBIN TIME: 16.5 s — AB (ref 11.6–15.2)

## 2014-09-08 LAB — MRSA PCR SCREENING: MRSA by PCR: NEGATIVE

## 2014-09-08 LAB — GLUCOSE, CAPILLARY
GLUCOSE-CAPILLARY: 151 mg/dL — AB (ref 65–99)
GLUCOSE-CAPILLARY: 85 mg/dL (ref 65–99)
Glucose-Capillary: 122 mg/dL — ABNORMAL HIGH (ref 65–99)

## 2014-09-08 LAB — I-STAT CG4 LACTIC ACID, ED
LACTIC ACID, VENOUS: 2.56 mmol/L — AB (ref 0.5–2.0)
Lactic Acid, Venous: 2.37 mmol/L (ref 0.5–2.0)

## 2014-09-08 LAB — LACTIC ACID, PLASMA: Lactic Acid, Venous: 1.9 mmol/L (ref 0.5–2.0)

## 2014-09-08 LAB — APTT: aPTT: 39 seconds — ABNORMAL HIGH (ref 24–37)

## 2014-09-08 LAB — URINE MICROSCOPIC-ADD ON

## 2014-09-08 MED ORDER — BUPROPION HCL ER (XL) 150 MG PO TB24
150.0000 mg | ORAL_TABLET | Freq: Every day | ORAL | Status: DC
  Administered 2014-09-08 – 2014-09-10 (×3): 150 mg via ORAL
  Filled 2014-09-08 (×5): qty 1

## 2014-09-08 MED ORDER — LORATADINE 10 MG PO TABS
10.0000 mg | ORAL_TABLET | Freq: Every day | ORAL | Status: DC
  Administered 2014-09-08 – 2014-09-10 (×3): 10 mg via ORAL
  Filled 2014-09-08 (×3): qty 1

## 2014-09-08 MED ORDER — POLYSACCHARIDE IRON COMPLEX 150 MG PO CAPS
150.0000 mg | ORAL_CAPSULE | Freq: Every day | ORAL | Status: DC
  Administered 2014-09-09 – 2014-09-10 (×2): 150 mg via ORAL
  Filled 2014-09-08 (×3): qty 1

## 2014-09-08 MED ORDER — PRO-STAT SUGAR FREE PO LIQD
30.0000 mL | Freq: Three times a day (TID) | ORAL | Status: DC
  Filled 2014-09-08 (×3): qty 30

## 2014-09-08 MED ORDER — MAGNESIUM OXIDE 400 (241.3 MG) MG PO TABS
400.0000 mg | ORAL_TABLET | Freq: Two times a day (BID) | ORAL | Status: DC
  Administered 2014-09-08 – 2014-09-10 (×4): 400 mg via ORAL
  Filled 2014-09-08 (×5): qty 1

## 2014-09-08 MED ORDER — RISAQUAD PO CAPS
1.0000 | ORAL_CAPSULE | Freq: Every day | ORAL | Status: DC
  Administered 2014-09-08 – 2014-09-10 (×3): 1 via ORAL
  Filled 2014-09-08 (×3): qty 1

## 2014-09-08 MED ORDER — ALENDRONATE SODIUM 70 MG PO TABS: 70.0000 mg | ORAL_TABLET | ORAL | Status: DC

## 2014-09-08 MED ORDER — MONTELUKAST SODIUM 10 MG PO TABS
10.0000 mg | ORAL_TABLET | Freq: Every day | ORAL | Status: DC
  Administered 2014-09-08 – 2014-09-10 (×3): 10 mg via ORAL
  Filled 2014-09-08 (×3): qty 1

## 2014-09-08 MED ORDER — LEVOTHYROXINE SODIUM 100 MCG PO TABS: 200.0000 ug | ORAL_TABLET | Freq: Every day | ORAL | Status: DC

## 2014-09-08 MED ORDER — FENTANYL CITRATE (PF) 100 MCG/2ML IJ SOLN
50.0000 ug | Freq: Once | INTRAMUSCULAR | Status: AC
  Administered 2014-09-08: 50 ug via INTRAVENOUS
  Filled 2014-09-08: qty 2

## 2014-09-08 MED ORDER — B COMPLEX-C PO TABS
1.0000 | ORAL_TABLET | Freq: Every day | ORAL | Status: DC
  Administered 2014-09-09 – 2014-09-10 (×2): 1 via ORAL
  Filled 2014-09-08 (×5): qty 1

## 2014-09-08 MED ORDER — DIPHENHYDRAMINE HCL 12.5 MG/5ML PO ELIX
12.5000 mg | ORAL_SOLUTION | Freq: Four times a day (QID) | ORAL | Status: DC | PRN
Start: 2014-09-08 — End: 2014-09-10

## 2014-09-08 MED ORDER — VITAMIN C 500 MG PO TABS: 500.0000 mg | ORAL_TABLET | Freq: Two times a day (BID) | ORAL | Status: DC

## 2014-09-08 MED ORDER — BACLOFEN 10 MG PO TABS
10.0000 mg | ORAL_TABLET | Freq: Two times a day (BID) | ORAL | Status: DC
  Administered 2014-09-08 – 2014-09-10 (×4): 10 mg via ORAL
  Filled 2014-09-08 (×4): qty 1

## 2014-09-08 MED ORDER — DM-GUAIFENESIN ER 30-600 MG PO TB12
1.0000 | ORAL_TABLET | Freq: Two times a day (BID) | ORAL | Status: DC | PRN
  Administered 2014-09-09: 1 via ORAL
  Filled 2014-09-08: qty 1

## 2014-09-08 MED ORDER — TRAMADOL HCL 50 MG PO TABS
50.0000 mg | ORAL_TABLET | Freq: Four times a day (QID) | ORAL | Status: DC | PRN
  Administered 2014-09-08 – 2014-09-10 (×5): 50 mg via ORAL
  Filled 2014-09-08 (×5): qty 1

## 2014-09-08 MED ORDER — SODIUM CHLORIDE 0.9 % IV BOLUS (SEPSIS)
1000.0000 mL | Freq: Once | INTRAVENOUS | Status: AC
  Administered 2014-09-08: 1000 mL via INTRAVENOUS

## 2014-09-08 MED ORDER — QUETIAPINE FUMARATE 25 MG PO TABS
62.5000 mg | ORAL_TABLET | Freq: Every day | ORAL | Status: DC
  Administered 2014-09-08 – 2014-09-09 (×2): 62.5 mg via ORAL
  Filled 2014-09-08 (×2): qty 3

## 2014-09-08 MED ORDER — TIZANIDINE HCL 4 MG PO TABS: 2.0000 mg | ORAL_TABLET | Freq: Four times a day (QID) | ORAL | Status: DC | PRN

## 2014-09-08 MED ORDER — ENOXAPARIN SODIUM 40 MG/0.4ML ~~LOC~~ SOLN
40.0000 mg | Freq: Every day | SUBCUTANEOUS | Status: DC
  Administered 2014-09-08 – 2014-09-10 (×3): 40 mg via SUBCUTANEOUS
  Filled 2014-09-08 (×3): qty 0.4

## 2014-09-08 MED ORDER — VITAMIN D 1000 UNITS PO TABS
1000.0000 [IU] | ORAL_TABLET | Freq: Every day | ORAL | Status: DC
  Administered 2014-09-09 – 2014-09-10 (×2): 1000 [IU] via ORAL
  Filled 2014-09-08 (×3): qty 1

## 2014-09-08 MED ORDER — CARVEDILOL 12.5 MG PO TABS
12.5000 mg | ORAL_TABLET | Freq: Two times a day (BID) | ORAL | Status: DC
  Administered 2014-09-08 – 2014-09-10 (×4): 12.5 mg via ORAL
  Filled 2014-09-08 (×5): qty 1

## 2014-09-08 MED ORDER — ALBUTEROL SULFATE HFA 108 (90 BASE) MCG/ACT IN AERS
2.0000 | INHALATION_SPRAY | Freq: Three times a day (TID) | RESPIRATORY_TRACT | Status: DC | PRN
Start: 2014-09-08 — End: 2014-09-08

## 2014-09-08 MED ORDER — PANTOPRAZOLE SODIUM 40 MG PO TBEC
40.0000 mg | DELAYED_RELEASE_TABLET | Freq: Every day | ORAL | Status: DC
  Administered 2014-09-09 – 2014-09-10 (×2): 40 mg via ORAL
  Filled 2014-09-08 (×3): qty 1

## 2014-09-08 MED ORDER — CALCIUM CARBONATE-VITAMIN D 500-200 MG-UNIT PO TABS
1.0000 | ORAL_TABLET | Freq: Two times a day (BID) | ORAL | Status: DC
  Administered 2014-09-09 – 2014-09-10 (×3): 1 via ORAL
  Filled 2014-09-08 (×4): qty 1

## 2014-09-08 MED ORDER — EZETIMIBE 10 MG PO TABS
10.0000 mg | ORAL_TABLET | Freq: Every day | ORAL | Status: DC
  Administered 2014-09-09 – 2014-09-10 (×2): 10 mg via ORAL
  Filled 2014-09-08 (×3): qty 1

## 2014-09-08 MED ORDER — GABAPENTIN 300 MG PO CAPS
300.0000 mg | ORAL_CAPSULE | Freq: Three times a day (TID) | ORAL | Status: DC
  Administered 2014-09-08 – 2014-09-10 (×8): 300 mg via ORAL
  Filled 2014-09-08 (×8): qty 1

## 2014-09-08 MED ORDER — ONDANSETRON HCL 4 MG/2ML IJ SOLN
4.0000 mg | Freq: Once | INTRAMUSCULAR | Status: AC
  Administered 2014-09-08: 4 mg via INTRAMUSCULAR
  Filled 2014-09-08: qty 2

## 2014-09-08 MED ORDER — B COMPLEX PO TABS: 1.0000 | ORAL_TABLET | Freq: Every day | ORAL | Status: DC

## 2014-09-08 MED ORDER — SODIUM CHLORIDE 0.9 % IV SOLN
INTRAVENOUS | Status: DC
  Administered 2014-09-08: 100 mL/h via INTRAVENOUS
  Administered 2014-09-09 – 2014-09-10 (×5): via INTRAVENOUS

## 2014-09-08 MED ORDER — DOXYCYCLINE HYCLATE 100 MG PO TABS
100.0000 mg | ORAL_TABLET | Freq: Two times a day (BID) | ORAL | Status: DC
  Administered 2014-09-08 – 2014-09-10 (×5): 100 mg via ORAL
  Filled 2014-09-08 (×5): qty 1

## 2014-09-08 MED ORDER — LEVOTHYROXINE SODIUM 75 MCG PO TABS
225.0000 ug | ORAL_TABLET | ORAL | Status: DC
  Administered 2014-09-09 – 2014-09-10 (×2): 225 ug via ORAL
  Filled 2014-09-08 (×3): qty 3

## 2014-09-08 MED ORDER — CALCIUM CITRATE-VITAMIN D 500-400 MG-UNIT PO CHEW: 1.0000 | CHEWABLE_TABLET | Freq: Two times a day (BID) | ORAL | Status: DC

## 2014-09-08 MED ORDER — FUROSEMIDE 40 MG PO TABS
40.0000 mg | ORAL_TABLET | Freq: Every day | ORAL | Status: DC
  Administered 2014-09-08 – 2014-09-10 (×3): 40 mg via ORAL
  Filled 2014-09-08 (×3): qty 1

## 2014-09-08 MED ORDER — HYDROXYZINE HCL 25 MG PO TABS: 25.0000 mg | ORAL_TABLET | Freq: Four times a day (QID) | ORAL | Status: DC | PRN

## 2014-09-08 MED ORDER — CRANBERRY 475 MG PO CAPS
1.0000 | ORAL_CAPSULE | Freq: Every day | ORAL | Status: DC
Start: 2014-09-08 — End: 2014-09-08

## 2014-09-08 MED ORDER — ASPIRIN EC 81 MG PO TBEC
81.0000 mg | DELAYED_RELEASE_TABLET | Freq: Every day | ORAL | Status: DC
  Administered 2014-09-08 – 2014-09-10 (×3): 81 mg via ORAL
  Filled 2014-09-08 (×3): qty 1

## 2014-09-08 MED ORDER — DIPHENHYDRAMINE HCL 12.5 MG PO CHEW: 12.5000 mg | CHEWABLE_TABLET | Freq: Four times a day (QID) | ORAL | Status: DC | PRN

## 2014-09-08 MED ORDER — INSULIN ASPART 100 UNIT/ML ~~LOC~~ SOLN: 0.0000 [IU] | Freq: Every day | SUBCUTANEOUS | Status: DC

## 2014-09-08 MED ORDER — KRILL OIL 300 MG PO CAPS: 300.0000 mg | ORAL_CAPSULE | Freq: Two times a day (BID) | ORAL | Status: DC

## 2014-09-08 MED ORDER — SODIUM CHLORIDE 0.9 % IJ SOLN
3.0000 mL | Freq: Two times a day (BID) | INTRAMUSCULAR | Status: DC
  Administered 2014-09-08 – 2014-09-09 (×2): 3 mL via INTRAVENOUS

## 2014-09-08 MED ORDER — SEA-OMEGA 50 1000 MG PO CAPS: 1.0000 | ORAL_CAPSULE | Freq: Two times a day (BID) | ORAL | Status: DC

## 2014-09-08 MED ORDER — LEVOTHYROXINE SODIUM 25 MCG PO TABS: 25.0000 ug | ORAL_TABLET | Freq: Every day | ORAL | Status: DC

## 2014-09-08 MED ORDER — OXYCODONE HCL 5 MG PO TABS
5.0000 mg | ORAL_TABLET | Freq: Four times a day (QID) | ORAL | Status: DC | PRN
  Administered 2014-09-08: 5 mg via ORAL
  Filled 2014-09-08 (×2): qty 1

## 2014-09-08 MED ORDER — COLCHICINE 0.6 MG PO TABS
0.6000 mg | ORAL_TABLET | Freq: Two times a day (BID) | ORAL | Status: DC
  Administered 2014-09-08 – 2014-09-10 (×5): 0.6 mg via ORAL
  Filled 2014-09-08 (×5): qty 1

## 2014-09-08 MED ORDER — ALUM & MAG HYDROXIDE-SIMETH 200-200-20 MG/5ML PO SUSP: 30.0000 mL | ORAL | Status: DC | PRN

## 2014-09-08 MED ORDER — CIPROFLOXACIN IN D5W 400 MG/200ML IV SOLN
400.0000 mg | Freq: Once | INTRAVENOUS | Status: AC
  Administered 2014-09-08: 400 mg via INTRAVENOUS
  Filled 2014-09-08: qty 200

## 2014-09-08 MED ORDER — LORAZEPAM 0.5 MG PO TABS
0.2500 mg | ORAL_TABLET | Freq: Four times a day (QID) | ORAL | Status: DC | PRN
  Administered 2014-09-08 – 2014-09-10 (×3): 0.25 mg via ORAL
  Filled 2014-09-08 (×3): qty 1

## 2014-09-08 MED ORDER — SODIUM CHLORIDE 0.9 % IV SOLN
1.0000 g | INTRAVENOUS | Status: DC
  Administered 2014-09-09 – 2014-09-10 (×2): 1 g via INTRAVENOUS
  Filled 2014-09-08 (×3): qty 1

## 2014-09-08 MED ORDER — MORPHINE SULFATE 4 MG/ML IJ SOLN
4.0000 mg | Freq: Once | INTRAMUSCULAR | Status: AC
Start: 2014-09-08 — End: 2014-09-08
  Administered 2014-09-08: 4 mg via INTRAVENOUS
  Filled 2014-09-08: qty 1

## 2014-09-08 MED ORDER — INSULIN ASPART 100 UNIT/ML ~~LOC~~ SOLN: 0.0000 [IU] | SUBCUTANEOUS | Status: DC

## 2014-09-08 MED ORDER — SODIUM CHLORIDE 0.9 % IV SOLN
1.0000 g | Freq: Once | INTRAVENOUS | Status: AC
  Administered 2014-09-08: 1 g via INTRAVENOUS
  Filled 2014-09-08: qty 1

## 2014-09-08 MED ORDER — OMEGA-3-ACID ETHYL ESTERS 1 G PO CAPS
1.0000 g | ORAL_CAPSULE | Freq: Two times a day (BID) | ORAL | Status: DC
  Administered 2014-09-09 – 2014-09-10 (×3): 1 g via ORAL
  Filled 2014-09-08 (×4): qty 1

## 2014-09-08 MED ORDER — SENNOSIDES-DOCUSATE SODIUM 8.6-50 MG PO TABS
1.0000 | ORAL_TABLET | Freq: Every day | ORAL | Status: DC
  Filled 2014-09-08 (×3): qty 1

## 2014-09-08 MED ORDER — INSULIN ASPART 100 UNIT/ML ~~LOC~~ SOLN
0.0000 [IU] | Freq: Three times a day (TID) | SUBCUTANEOUS | Status: DC
  Administered 2014-09-08: 3 [IU] via SUBCUTANEOUS
  Administered 2014-09-08: 2 [IU] via SUBCUTANEOUS

## 2014-09-08 MED ORDER — QUETIAPINE FUMARATE 25 MG PO TABS: 50.0000 mg | ORAL_TABLET | Freq: Every day | ORAL | Status: DC

## 2014-09-08 MED ORDER — QUETIAPINE FUMARATE 25 MG PO TABS: 12.5000 mg | ORAL_TABLET | Freq: Every day | ORAL | Status: DC

## 2014-09-08 MED ORDER — ACETAMINOPHEN 500 MG PO TABS
500.0000 mg | ORAL_TABLET | Freq: Four times a day (QID) | ORAL | Status: DC | PRN
  Administered 2014-09-08 – 2014-09-10 (×5): 500 mg via ORAL
  Filled 2014-09-08 (×5): qty 1

## 2014-09-08 MED ORDER — PROMETHAZINE HCL 12.5 MG PO TABS: 12.5000 mg | ORAL_TABLET | Freq: Four times a day (QID) | ORAL | Status: DC | PRN

## 2014-09-08 MED ORDER — IPRATROPIUM-ALBUTEROL 0.5-2.5 (3) MG/3ML IN SOLN: 3.0000 mL | Freq: Four times a day (QID) | RESPIRATORY_TRACT | Status: DC | PRN

## 2014-09-08 NOTE — Care Management Note (Signed)
Case Management Note  Patient Details  Name: [name] MRN: [id number] Date of Birth: 02-Apr-1945  Subjective/Objective:                  Pt admitted from [facility] with sepsis. Anticipate discharge back to facility at discharge.  Action/Plan: CSW is aware and will arrange discharge back to facility when medically stable.  Expected Discharge Date:                  Expected Discharge Plan:  Skilled Nursing Facility  In-House Referral:  Clinical Social Work  Discharge planning Services  CM Consult  Post Acute Care Choice:  NA Choice offered to:  NA  DME Arranged:    DME Agency:     HH Arranged:    [place] Agency:     Status of Service:  Completed, signed off  Medicare Important Message Given:    Date Medicare IM Given:    Medicare IM give by:    Date Additional Medicare IM Given:    Additional Medicare Important Message give by:     If discussed at [place] of Stay Meetings, dates discussed:    Additional Comments:  Joylene Draft, RN 09/08/2014, 3:37 PM

## 2014-09-08 NOTE — Progress Notes (Signed)
Patient agreed to be turned and reposition for skin assessment.

## 2014-09-08 NOTE — Progress Notes (Signed)
PHARMACIST - PHYSICIAN ORDER COMMUNICATION  CONCERNING: P&T Medication Policy on Herbal Medications & P&T Medication Policy Regarding Oral Bisphosphonates  Herbal Meds: DESCRIPTION:  This patient's order for:  Cranberry has been noted.  This product(s) is classified as an "herbal" or natural product. Due to a lack of definitive safety studies or FDA approval, nonstandard manufacturing practices, plus the potential risk of unknown drug-drug interactions while on inpatient medications, the Pharmacy and Therapeutics Committee does not permit the use of "herbal" or natural products of this type within [facility].   ACTION TAKEN: The pharmacy department is unable to verify this order at this time and your patient has been informed of this safety policy. Please reevaluate patient's clinical condition at discharge and address if the herbal or natural product(s) should be resumed at that time.   Bisphosphates: RECOMMENDATION: Your order for alendronate (Fosamax), ibandronate (Boniva), or risedronate (Actonel) has been discontinued at this time.  If the patient's post-hospital medical condition warrants safe use of this class of drugs, please resume the pre-hospital regimen upon discharge.  DESCRIPTION:  Alendronate (Fosamax), ibandronate (Boniva), and risedronate (Actonel) can cause severe esophageal erosions in patients who are unable to remain upright at least 30 minutes after taking this medication.   Since brief interruptions in therapy are thought to have minimal impact on bone mineral density, the [place] has established that bisphosphonate orders should be routinely discontinued during hospitalization.   To override this safety policy and permit administration of Boniva, Fosamax, or Actonel in the hospital, prescribers must write "DO NOT HOLD" in the comments section when placing the order for this class of medications.  Netta Cedars, PharmD,  BCPS 09/08/2014@10 :41 AM    Netta Cedars, PharmD, BCPS 09/08/2014@10 :38 AM

## 2014-09-08 NOTE — ED Provider Notes (Signed)
CSN: [id number]     Arrival date & time 09/08/14  0422 History   First MD Initiated Contact with Patient 09/08/14 0435     Chief Complaint  Patient presents with  . Fever     (Consider location/radiation/quality/duration/timing/severity/associated sxs/prior Treatment) HPI  This is a 69 year old female with a history of paraplegia, hypertension, diabetes, pyelonephritis, chronic indwelling Foley who presents from [facility] with a fever. Patient reports that she has had fever on and off for the last 2 days. Reported temperature of 101 at the facility. Patient has a complex past medical history and recent medical history significant for a spinal fluid collection for which she is on doxycycline daily. She was admitted for this in February. Living facility reports that she had a white blood count of 24,000. For this she was empirically given 1 dose of ertapenem and one dose of Levaquin. Patient denies any cough, chest pain, or shortness of breath. Patient denies any back pain. She states that she did not have back pain prior to them finding this fluid collection on her back. She is followed by infectious disease.  Past Medical History  Diagnosis Date  . Paraplegic spinal paralysis   . Hypertension   . Bipolar 1 disorder   . Thyroid disease   . Diabetes mellitus without complication   . Obesity   . Pyelonephritis   . COPD with asthma   . GERD (gastroesophageal reflux disease)    Past Surgical History  Procedure Laterality Date  . Back surgery    . Abdominal hysterectomy     Family History  Problem Relation Age of Onset  . Heart disease Mother    History  Substance Use Topics  . Smoking status: Former Research scientist (life sciences)  . Smokeless tobacco: Never Used  . Alcohol Use: No   OB History    No data available     Review of Systems  Constitutional: Positive for fever and chills.  Respiratory: Negative for cough, chest tightness and shortness of breath.   Cardiovascular: Negative for chest pain.   Gastrointestinal: Positive for nausea. Negative for vomiting and abdominal pain.  Genitourinary: Negative for dysuria.       Chronic indwelling Foley  Musculoskeletal: Negative for back pain.  Neurological: Negative for headaches.  Psychiatric/Behavioral: Negative for confusion.  All other systems reviewed and are negative.     Allergies  Codeine; Propranolol hcl; Rocephin; and Vancomycin  Home Medications   Prior to Admission medications   Medication Sig Start Date End Date Taking? Authorizing Provider  acetaminophen (TYLENOL) 500 MG tablet Take 500 mg by mouth every 6 (six) hours as needed for pain.   Yes Historical Provider, MD  albuterol (PROVENTIL HFA) 108 (90 BASE) MCG/ACT inhaler Inhale 2 puffs into the lungs every 8 (eight) hours as needed (Asthma).    Yes Historical Provider, MD  alum & mag hydroxide-simeth (MAALOX/MYLANTA) 200-200-20 MG/5ML suspension Take 30 mLs by mouth every 4 (four) hours as needed for indigestion or heartburn.   Yes Historical Provider, MD  Amino Acids-Protein Hydrolys (FEEDING SUPPLEMENT, PRO-STAT SUGAR FREE 64,) LIQD Take 30 mLs by mouth 3 (three) times daily with meals. 04/01/14  Yes Annita Brod, MD  b complex vitamins tablet Take 1 tablet by mouth daily.   Yes Historical Provider, MD  baclofen (LIORESAL) 10 MG tablet Take 10 mg by mouth 2 (two) times daily.    Yes Historical Provider, MD  buPROPion (WELLBUTRIN XL) 150 MG 24 hr tablet Take 150 mg by mouth daily.  Yes Historical Provider, MD  calcium citrate-vitamin D 500-400 MG-UNIT Chew 1 tablet by mouth 2 (two) times daily.   Yes Historical Provider, MD  carvedilol (COREG) 12.5 MG tablet Take 12.5 mg by mouth 2 (two) times daily with a meal.   Yes Historical Provider, MD  cetirizine (ZYRTEC) 10 MG tablet Take 10 mg by mouth daily.   Yes Historical Provider, MD  cholecalciferol (VITAMIN D) 1000 UNITS tablet Take 1,000 Units by mouth daily.   Yes Historical Provider, MD  colchicine 0.6 MG tablet  Take 0.6 mg by mouth 2 (two) times daily.   Yes Historical Provider, MD  diphenhydrAMINE (BENADRYL) 12.5 MG chewable tablet Chew 12.5 mg by mouth 4 (four) times daily as needed for allergies.   Yes Historical Provider, MD  doxycycline (VIBRA-TABS) 100 MG tablet Take 1 tablet (100 mg total) by mouth 2 (two) times daily. 07/21/14  Yes Campbell Riches, MD  ezetimibe (ZETIA) 10 MG tablet Take 10 mg by mouth daily.   Yes Historical Provider, MD  furosemide (LASIX) 40 MG tablet Take 40 mg by mouth daily.   Yes Historical Provider, MD  gabapentin (NEURONTIN) 300 MG capsule Take 300 mg by mouth 3 (three) times daily.   Yes Historical Provider, MD  hydrOXYzine (ATARAX/VISTARIL) 25 MG tablet Take 25 mg by mouth every 6 (six) hours as needed for itching.   Yes Historical Provider, MD  ipratropium-albuterol (DUONEB) 0.5-2.5 (3) MG/3ML SOLN Take 3 mLs by nebulization every 6 (six) hours as needed (shortness of breath).    Yes Historical Provider, MD  iron polysaccharides (POLY-IRON 150) 150 MG capsule Take 150 mg by mouth daily.   Yes Historical Provider, MD  Javier Docker Oil 300 MG CAPS Take 300 mg by mouth 2 (two) times daily.   Yes Historical Provider, MD  levothyroxine (SYNTHROID, LEVOTHROID) 200 MCG tablet Take 200 mcg by mouth daily. Takes with Synthroid (Levothyroxine) 25 mcg for a total of 254mcg daily at 6:00am   Yes Historical Provider, MD  levothyroxine (SYNTHROID, LEVOTHROID) 25 MCG tablet Take 25 mcg by mouth daily. Takes with Synthroid (Levothyroxine) 263mcg for a total of 262mcg daily at 6:00am   Yes Historical Provider, MD  LORazepam (ATIVAN) 0.5 MG tablet Take 0.5 tablets (0.25 mg total) by mouth every 6 (six) hours as needed for anxiety. Patient taking differently: Take 0.5 mg by mouth every 6 (six) hours as needed for anxiety.  05/20/14  Yes Reyne Dumas, MD  magnesium oxide (MAG-OX) 400 MG tablet Take 400 mg by mouth 2 (two) times daily.   Yes Historical Provider, MD  montelukast (SINGULAIR) 10 MG  tablet Take 10 mg by mouth daily.   Yes Historical Provider, MD  omeprazole (PRILOSEC) 20 MG capsule Take 20 mg by mouth daily.   Yes Historical Provider, MD  oxycodone (OXY-IR) 5 MG capsule Take 1 capsule (5 mg total) by mouth every 6 (six) hours as needed. 05/20/14  Yes Reyne Dumas, MD  promethazine (PHENERGAN) 12.5 MG tablet Take 12.5 mg by mouth every 6 (six) hours as needed for nausea or vomiting.   Yes Historical Provider, MD  QUEtiapine (SEROQUEL) 25 MG tablet Take 0.5 tablets (12.5 mg total) by mouth at bedtime. 04/01/14  Yes Annita Brod, MD  QUEtiapine (SEROQUEL) 50 MG tablet Take 50 mg by mouth at bedtime.   Yes Historical Provider, MD  sennosides-docusate sodium (SENOKOT-S) 8.6-50 MG tablet Take 1 tablet by mouth daily.   Yes Historical Provider, MD  traMADol (ULTRAM) 50 MG tablet Take by  mouth every 6 (six) hours as needed.   Yes Historical Provider, MD  vitamin C (ASCORBIC ACID) 500 MG tablet Take 500 mg by mouth 2 (two) times daily.   Yes Historical Provider, MD  acidophilus (RISAQUAD) CAPS capsule Take 1 capsule by mouth daily.    Historical Provider, MD  alendronate (FOSAMAX) 70 MG tablet Take 70 mg by mouth once a week. Take with a full glass of water on an empty stomach.    Historical Provider, MD  aspirin EC 81 MG tablet Take 81 mg by mouth daily.    Historical Provider, MD  Cranberry 475 MG CAPS Take 1 capsule by mouth daily.    Historical Provider, MD  dextromethorphan-guaiFENesin (MUCINEX DM) 30-600 MG per 12 hr tablet Take 1 tablet by mouth 2 (two) times daily as needed for cough.    Historical Provider, MD  mupirocin cream (BACTROBAN) 2 % Apply topically 2 (two) times daily. Patient not taking: Reported on 07/21/2014 05/20/14   Reyne Dumas, MD  Omega-3 Fatty Acids (SEA-OMEGA PO) Take 1 capsule by mouth 2 (two) times daily.    Historical Provider, MD  tiZANidine (ZANAFLEX) 2 MG tablet Take 2 mg by mouth every 6 (six) hours as needed for muscle spasms.    Historical  Provider, MD   BP 119/73 mmHg  Pulse 106  Temp(Src) 102.1 F (38.9 C) (Oral)  Resp 17  Ht 5' 7.5" (1.715 m)  Wt 280 lb (127.007 kg)  BMI 43.18 kg/m2  SpO2 91% Physical Exam  Constitutional: She is oriented to person, place, and time.  Chronically ill-appearing, no acute distress  HENT:  Head: Normocephalic and atraumatic.  Mucous membranes dry  Eyes: Pupils are equal, round, and reactive to light.  Cardiovascular: Regular rhythm and normal heart sounds.   Tachycardia  Pulmonary/Chest: Effort normal. No respiratory distress. She has no wheezes.  Abdominal: Soft. Bowel sounds are normal. There is no tenderness. There is no rebound and no guarding.  Genitourinary:  Chronic indwelling Foley  Musculoskeletal: She exhibits edema.  3+ bilateral lower extremity edema  Neurological: She is alert and oriented to person, place, and time.  Skin: Skin is warm and dry.  Psychiatric: She has a normal mood and affect.  Nursing note and vitals reviewed.   ED Course  Procedures (including critical care time)  CRITICAL CARE Performed by: Merryl Hacker   Total critical care time: 45 min  Critical care time was exclusive of separately billable procedures and treating other patients.  Critical care was necessary to treat or prevent imminent or life-threatening deterioration.  Critical care was time spent personally by me on the following activities: development of treatment plan with patient and/or surrogate as well as nursing, discussions with consultants, evaluation of patient's response to treatment, examination of patient, obtaining history from patient or surrogate, ordering and performing treatments and interventions, ordering and review of laboratory studies, ordering and review of radiographic studies, pulse oximetry and re-evaluation of patient's condition.  Labs Review Labs Reviewed  COMPREHENSIVE METABOLIC PANEL - Abnormal; Notable for the following:    Sodium 134 (*)     Chloride 96 (*)    Glucose, Bld 213 (*)    Calcium 8.4 (*)    Albumin 3.3 (*)    All other components within normal limits  CBC WITH DIFFERENTIAL/PLATELET - Abnormal; Notable for the following:    WBC 14.0 (*)    Hemoglobin 11.7 (*)    MCH 25.7 (*)    RDW 16.1 (*)  Neutrophils Relative % 88 (*)    Neutro Abs 12.3 (*)    Lymphocytes Relative 8 (*)    All other components within normal limits  I-STAT CG4 LACTIC ACID, ED - Abnormal; Notable for the following:    Lactic Acid, Venous 2.37 (*)    All other components within normal limits  CULTURE, BLOOD (ROUTINE X 2)  CULTURE, BLOOD (ROUTINE X 2)  URINE CULTURE  URINALYSIS, ROUTINE W REFLEX MICROSCOPIC (NOT AT [facility])    Imaging Review Dg Chest Port 1 View  09/08/2014   CLINICAL DATA:  Code sepsis, fever for 2 days. History of hypertension, diabetes, discitis.  EXAM: PORTABLE CHEST - 1 VIEW  COMPARISON:  Chest radiograph March 31, 2014  FINDINGS: Cardiomediastinal silhouette is unremarkable, patient is rotated to the RIGHT. No pleural effusion or focal consolidation. No pneumothorax. Moderate LEFT acromioclavicular osteoarthrosis. Soft tissue planes are nonsuspicious.  IMPRESSION: No acute cardiopulmonary process.   Electronically Signed   By: Elon Alas M.D.   On: 09/08/2014 05:21     EKG Interpretation None      MDM   Final diagnoses:  None   patient presents with fever and tachycardia from her living facility. Complicated recent past medical history.  On chronic doxycycline for fluid collection on MRI. Also has a chronic indwelling Foley. These are possible sources for fever. Sepsis workup initiated. Lactate elevated to 2.37. Patient given fluids. She is not hypotensive.  Workup notable for white count of 14 with a left shift. Chest x-ray is unremarkable and urinalysis is pending.  I reviewed the patient's chart. She reports an allergy to vancomycin and Rocephin. She tolerated vancomycin and Zosyn while hospitalized in  February. Following discharge, at some point she developed hives to either vancomycin or Rocephin. Based on a course of events, my suspicion is her allergies to Rocephin. However, patient states that she received "an injection at the living facility" which was vancomycin and she immediately broke out in hives. She states that this was an IM injection. Makes me wonder if this was Rocephin. Patient refuses to allow me to give her vancomycin.  ID consulted. Discussed with Dr. Megan Salon.  Per Dr. Megan Salon, if patient will not allow vancomycin to be given, could start with ertapenem, aztreonam or Cipro and if you need to add additional coverage can add daptomycin. Given that the patient's white count was 14 today and reportedly greater than 20 at the living facility after being given ertapenem and Levaquin, patient was given a second dose of ertapenem and a dose of Cipro.  Urinalysis is nitrite positive. Suspect urinary source. Will admit for sepsis secondary to urinary tract infection.     Merryl Hacker, MD 09/08/14 [phone]

## 2014-09-08 NOTE — Progress Notes (Signed)
Patient is requesting a regular diet d/t her not experiencing nausea at this time. Paged on-call Md, will follow any new orders given.

## 2014-09-08 NOTE — Progress Notes (Signed)
Patient refused to be turned at this time for skin assessment. Patient wants to wait to be turned.

## 2014-09-08 NOTE — H&P (Addendum)
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 Patient currently on prolonged therapy with oral doxycycline and follows with ID Dr. Johnnye Sima as outpatient. The hospital course was also complicated by C. difficile colitis that was treated. Patient reports having fever off and on for the past 2-3 days and had a maximum temperature of 101F at the facility. At the facility she had a white count of 24,000 and was given an empiric dose of ertapenem and Levaquin. She reports having headache and nausea for the past 2 weeks. She is also on  pain medications for her chronic low back pain which has not worsened. She denies any cough, shortness of breath, chills, chest pain, palpitations, vomiting, dysuria or diarrhea. She has a chronic suprapubic catheter that was changed 2 weeks back. She has chronic bowel incontinence and cannot feel anything below her umbilicus. She denies any diarrhea.  Course in the ED Patient was septic with temperature of 102F, mild tachycardia, leukocytosis with WBC of 14 K an elevated lactate of 2.7. Patient received 1 L IV normal saline bolus, 2 mg IV fentanyl and 4 mg IV morphine for pain.  Foley suprapubic catheter was changed in the ED and UA suggested UTI which is likely the source of infection. Chest x-ray was unremarkable. After discussing with ID consult Dr. Megan Salon on the phone patient was given a dose of IV ertapenem and IV ciprofloxacin (patient  refused to be given vancomycin stating that she developed a diffuse rash at the facility after receiving vancomycin).  Patient denies headache, dizziness, fever, chills, nausea , vomiting, chest pain, palpitations, SOB, abdominal pain, bowel or urinary symptoms. Denies change in weight or appetite.  Review of Systems:  As outlined in history of present illness, 12 point review of systems otherwise unremarkable.   Past Medical History  Diagnosis Date  . Paraplegic spinal paralysis   . Hypertension   . Bipolar 1 disorder   . Thyroid disease   . Diabetes mellitus without complication   . Obesity   . Pyelonephritis   . COPD with asthma   . GERD (gastroesophageal reflux disease)    Past Surgical History  Procedure Laterality Date  . Back surgery    . Abdominal hysterectomy     Social History:  reports that she has quit smoking. She has never used smokeless tobacco. She reports that she does not drink alcohol or use illicit drugs.  Allergies  Allergen Reactions  . Codeine   . Propranolol Hcl   . Rocephin [Ceftriaxone] Hives  . Vancomycin Hives    Family History  Problem Relation Age of Onset  . Heart disease Mother     Prior to Admission medications   Medication Sig Start Date End Date Taking? Authorizing Provider  acetaminophen (TYLENOL) 500 MG tablet Take 500 mg by mouth every 6 (six) hours as needed for pain.   Yes Historical Provider, MD  albuterol (PROVENTIL HFA) 108 (90 BASE) MCG/ACT inhaler Inhale 2 puffs into the lungs every 8 (eight) hours as needed (Asthma).  Yes Historical Provider, MD  alum & mag hydroxide-simeth (MAALOX/MYLANTA) 200-200-20 MG/5ML suspension Take 30 mLs by mouth every 4 (four) hours as needed for indigestion or heartburn.   Yes Historical Provider, MD  Amino Acids-Protein Hydrolys (FEEDING SUPPLEMENT, PRO-STAT SUGAR FREE 64,) LIQD Take 30 mLs by mouth 3 (three) times daily with meals. 04/01/14  Yes Annita Brod, MD  b complex vitamins tablet  Take 1 tablet by mouth daily.   Yes Historical Provider, MD  baclofen (LIORESAL) 10 MG tablet Take 10 mg by mouth 2 (two) times daily.    Yes Historical Provider, MD  buPROPion (WELLBUTRIN XL) 150 MG 24 hr tablet Take 150 mg by mouth daily.   Yes Historical Provider, MD  calcium citrate-vitamin D 500-400 MG-UNIT Chew 1 tablet by mouth 2 (two) times daily.   Yes Historical Provider, MD  carvedilol (COREG) 12.5 MG tablet Take 12.5 mg by mouth 2 (two) times daily with a meal.   Yes Historical Provider, MD  cetirizine (ZYRTEC) 10 MG tablet Take 10 mg by mouth daily.   Yes Historical Provider, MD  cholecalciferol (VITAMIN D) 1000 UNITS tablet Take 1,000 Units by mouth daily.   Yes Historical Provider, MD  colchicine 0.6 MG tablet Take 0.6 mg by mouth 2 (two) times daily.   Yes Historical Provider, MD  diphenhydrAMINE (BENADRYL) 12.5 MG chewable tablet Chew 12.5 mg by mouth 4 (four) times daily as needed for allergies.   Yes Historical Provider, MD  doxycycline (VIBRA-TABS) 100 MG tablet Take 1 tablet (100 mg total) by mouth 2 (two) times daily. 07/21/14  Yes Campbell Riches, MD  ezetimibe (ZETIA) 10 MG tablet Take 10 mg by mouth daily.   Yes Historical Provider, MD  furosemide (LASIX) 40 MG tablet Take 40 mg by mouth daily.   Yes Historical Provider, MD  gabapentin (NEURONTIN) 300 MG capsule Take 300 mg by mouth 3 (three) times daily.   Yes Historical Provider, MD  hydrOXYzine (ATARAX/VISTARIL) 25 MG tablet Take 25 mg by mouth every 6 (six) hours as needed for itching.   Yes Historical Provider, MD  ipratropium-albuterol (DUONEB) 0.5-2.5 (3) MG/3ML SOLN Take 3 mLs by nebulization every 6 (six) hours as needed (shortness of breath).    Yes Historical Provider, MD  iron polysaccharides (POLY-IRON 150) 150 MG capsule Take 150 mg by mouth daily.   Yes Historical Provider, MD  Javier Docker Oil 300 MG CAPS Take 300 mg by mouth 2 (two) times daily.   Yes Historical Provider, MD  levothyroxine (SYNTHROID, LEVOTHROID)  200 MCG tablet Take 200 mcg by mouth daily. Takes with Synthroid (Levothyroxine) 25 mcg for a total of 252mcg daily at 6:00am   Yes Historical Provider, MD  levothyroxine (SYNTHROID, LEVOTHROID) 25 MCG tablet Take 25 mcg by mouth daily. Takes with Synthroid (Levothyroxine) 247mcg for a total of 274mcg daily at 6:00am   Yes Historical Provider, MD  LORazepam (ATIVAN) 0.5 MG tablet Take 0.5 tablets (0.25 mg total) by mouth every 6 (six) hours as needed for anxiety. Patient taking differently: Take 0.5 mg by mouth every 6 (six) hours as needed for anxiety.  05/20/14  Yes Reyne Dumas, MD  magnesium oxide (MAG-OX) 400 MG tablet Take 400 mg by mouth 2 (two) times daily.   Yes Historical Provider, MD  montelukast (SINGULAIR) 10 MG tablet Take 10 mg by mouth daily.   Yes Historical Provider, MD  omeprazole (PRILOSEC) 20 MG capsule Take 20 mg by mouth daily.   Yes Historical Provider, MD  oxycodone (OXY-IR)  5 MG capsule Take 1 capsule (5 mg total) by mouth every 6 (six) hours as needed. 05/20/14  Yes Reyne Dumas, MD  promethazine (PHENERGAN) 12.5 MG tablet Take 12.5 mg by mouth every 6 (six) hours as needed for nausea or vomiting.   Yes Historical Provider, MD  QUEtiapine (SEROQUEL) 25 MG tablet Take 0.5 tablets (12.5 mg total) by mouth at bedtime. 04/01/14  Yes Annita Brod, MD  QUEtiapine (SEROQUEL) 50 MG tablet Take 50 mg by mouth at bedtime.   Yes Historical Provider, MD  sennosides-docusate sodium (SENOKOT-S) 8.6-50 MG tablet Take 1 tablet by mouth daily.   Yes Historical Provider, MD  traMADol (ULTRAM) 50 MG tablet Take by mouth every 6 (six) hours as needed.   Yes Historical Provider, MD  vitamin C (ASCORBIC ACID) 500 MG tablet Take 500 mg by mouth 2 (two) times daily.   Yes Historical Provider, MD  acidophilus (RISAQUAD) CAPS capsule Take 1 capsule by mouth daily.    Historical Provider, MD  alendronate (FOSAMAX) 70 MG tablet Take 70 mg by mouth once a week. Take with a full glass of water on an  empty stomach.    Historical Provider, MD  aspirin EC 81 MG tablet Take 81 mg by mouth daily.    Historical Provider, MD  Cranberry 475 MG CAPS Take 1 capsule by mouth daily.    Historical Provider, MD  dextromethorphan-guaiFENesin (MUCINEX DM) 30-600 MG per 12 hr tablet Take 1 tablet by mouth 2 (two) times daily as needed for cough.    Historical Provider, MD  mupirocin cream (BACTROBAN) 2 % Apply topically 2 (two) times daily. Patient not taking: Reported on 07/21/2014 05/20/14   Reyne Dumas, MD  Omega-3 Fatty Acids (SEA-OMEGA PO) Take 1 capsule by mouth 2 (two) times daily.    Historical Provider, MD  tiZANidine (ZANAFLEX) 2 MG tablet Take 2 mg by mouth every 6 (six) hours as needed for muscle spasms.    Historical Provider, MD     Physical Exam:  Filed Vitals:   09/08/14 0603 09/08/14 0615 09/08/14 0630 09/08/14 0700  BP: 117/53  115/66 118/58  Pulse: 107 110 105 105  Temp:      TempSrc:      Resp: 18 14 15 16   Height:      Weight:      SpO2: 96% 96% 97% 96%    Constitutional: Vital signs reviewed.  Elderly female in no acute distress HEENT: no pallor, no icterus, moist oral mucosa, no cervical lymphadenopathy Cardiovascular: RRR, S1 normal, S2 normal, no MRG Chest: CTAB, no wheezes, rales, or rhonchi Abdominal: Soft. Non-tender, non-distended, bowel sounds are normal, suprapubic catheter in place draining clear urine Ext: warm, no edema, no sensation in lower extremities . Unable to examine for any pressure ulcer or back tenderness.  Neurological: A&O x3, paraplegic  Labs on Admission:  Basic Metabolic Panel:  Recent Labs Lab 09/08/14 0439  NA 134*  K 4.3  CL 96*  CO2 27  GLUCOSE 213*  BUN 15  CREATININE 0.51  CALCIUM 8.4*   Liver Function Tests:  Recent Labs Lab 09/08/14 0439  AST 25  ALT 18  ALKPHOS 61  BILITOT 0.9  PROT 7.2  ALBUMIN 3.3*   No results for input(s): LIPASE, AMYLASE in the last 168 hours. No results for input(s): AMMONIA in the last 168  hours. CBC:  Recent Labs Lab 09/08/14 0439  WBC 14.0*  NEUTROABS 12.3*  HGB 11.7*  HCT 38.0  MCV 83.3  PLT 189   Cardiac Enzymes: No results for input(s): CKTOTAL, CKMB, CKMBINDEX, TROPONINI in the last 168 hours. BNP: Invalid input(s): POCBNP CBG: No results for input(s): GLUCAP in the last 168 hours.  Radiological Exams on Admission: Dg Chest Port 1 View  09/08/2014   CLINICAL DATA:  Code sepsis, fever for 2 days. History of hypertension, diabetes, discitis.  EXAM: PORTABLE CHEST - 1 VIEW  COMPARISON:  Chest radiograph March 31, 2014  FINDINGS: Cardiomediastinal silhouette is unremarkable, patient is rotated to the RIGHT. No pleural effusion or focal consolidation. No pneumothorax. Moderate LEFT acromioclavicular osteoarthrosis. Soft tissue planes are nonsuspicious.  IMPRESSION: No acute cardiopulmonary process.   Electronically Signed   By: Elon Alas M.D.   On: 09/08/2014 05:21    EKG: Pending  Assessment/Plan   Principle problem  Sepsis  possible source is UTI. catheter changed in ED. Placed on empiric ertapenem. Pt refused to be on vancomycin as she had allergy to it with  diffuse rash at [facility]. ( she was however tolerant to it while in the hospital in February) Follow blood and urine cx. Sepsis protocol initiated in the ED. Continue maintenance fluid. Repeat lactate normal.   Active problem Abscess of lumbar spine  follows with Dr Johnnye Sima. On chr doxycycline. Denies worsening back pain. Will hold off on MRI of LS spine at this time unless sepsis persists or worsens.   Chronic pain syndrome Continue pain control with baclofen, Neurontin and oxycodone. Continue Zanaflex  Essential hypertension Blood pressure stable. Continue Coreg.  Hypothyroidism Continue Synthroid  Iron deficiency anemia Hemoglobin stable. Continue iron supplement  GERD Continue PPI  Anxiety Continue low-dose Ativan   Diabetes mellitus Not on any medications. Monitor on  SSI  Diet:clears  DVT prophylaxis: sq lovenox   Code Status: full code Family Communication:  None at bedside Disposition Plan: Admit to telemetry. Will return to skilled nursing facility once clinically improved.  Louellen Molder [facility] Pager [phone]  Total time spent on admission :70 minutes  If 7PM-7AM, please contact night-coverage [email or web address] Password [facility] 09/08/2014, 8:43 AM

## 2014-09-08 NOTE — ED Notes (Addendum)
Pt arrived by EMS from [facility]. Reported that pt has been running a fever for the past 2 days. Pt got Levaquin & Invanz at facility. Invanz 1g given @ 0541 on 09/07/14

## 2014-09-08 NOTE — Progress Notes (Signed)
Patient refused several medication .

## 2014-09-08 NOTE — ED Notes (Signed)
Dr. Venora Maples given i-stat Lactic acid, 2.56.

## 2014-09-09 DIAGNOSIS — N390 Urinary tract infection, site not specified: Secondary | ICD-10-CM

## 2014-09-09 DIAGNOSIS — I10 Essential (primary) hypertension: Secondary | ICD-10-CM

## 2014-09-09 DIAGNOSIS — T8351XD Infection and inflammatory reaction due to indwelling urinary catheter, subsequent encounter: Secondary | ICD-10-CM

## 2014-09-09 LAB — CBC
HEMATOCRIT: 31.1 % — AB (ref 36.0–46.0)
Hemoglobin: 9.4 g/dL — ABNORMAL LOW (ref 12.0–15.0)
MCH: 25.1 pg — ABNORMAL LOW (ref 26.0–34.0)
MCHC: 30.2 g/dL (ref 30.0–36.0)
MCV: 83.2 fL (ref 78.0–100.0)
PLATELETS: 144 10*3/uL — AB (ref 150–400)
RBC: 3.74 MIL/uL — AB (ref 3.87–5.11)
RDW: 16 % — ABNORMAL HIGH (ref 11.5–15.5)
WBC: 5.2 10*3/uL (ref 4.0–10.5)

## 2014-09-09 LAB — GLUCOSE, CAPILLARY
GLUCOSE-CAPILLARY: 107 mg/dL — AB (ref 65–99)
GLUCOSE-CAPILLARY: 111 mg/dL — AB (ref 65–99)
GLUCOSE-CAPILLARY: 121 mg/dL — AB (ref 65–99)
GLUCOSE-CAPILLARY: 123 mg/dL — AB (ref 65–99)
GLUCOSE-CAPILLARY: 137 mg/dL — AB (ref 65–99)
Glucose-Capillary: 101 mg/dL — ABNORMAL HIGH (ref 65–99)

## 2014-09-09 LAB — URINE CULTURE: Colony Count: 75000

## 2014-09-09 MED ORDER — FLUTICASONE PROPIONATE 50 MCG/ACT NA SUSP
1.0000 | Freq: Every day | NASAL | Status: DC
  Administered 2014-09-09 – 2014-09-10 (×2): 1 via NASAL
  Filled 2014-09-09: qty 16

## 2014-09-09 MED ORDER — INSULIN ASPART 100 UNIT/ML ~~LOC~~ SOLN
0.0000 [IU] | Freq: Three times a day (TID) | SUBCUTANEOUS | Status: DC
  Administered 2014-09-09 – 2014-09-10 (×2): 1 [IU] via SUBCUTANEOUS

## 2014-09-09 NOTE — Progress Notes (Signed)
Replaced patients chronic foley catheter due to patients foley continuing to leak. Patient tolerated insertion well. Denise, NT assisted me in the insertion. Sterile technique was used during insertion and urine return was noted. Will continue to monitor patient at this time.

## 2014-09-09 NOTE — Progress Notes (Signed)
[facility] PROGRESS NOTE  [name] ZDG:[id number] DOB: 12-Apr-1945 DOA: 09/08/2014 PCP: [name], MD  Assessment/Plan: 69 y/o female with PMH of HTN, DM, Gout, Paraplegic (h/o spinal cord hemorrhage) on chronic indwelling foley, ( h/o L1-L2 osteomyelitis/spinal abscess in February 2016 status post IR aspiration 6 weeks of empiric IV antibiotics, currently on prolonged therapy with oral doxycycline and follows with ID Dr. Johnnye Sima) presented with fever for 2-3 days at [facility].  At the facility she had a white count of 24,000 and was given an empiric dose of ertapenem and Levaquin for UTI.  -ED: Patient was febrile, 102F, mild tachycardia, leukocytosis with WBC of 14 K an elevated lactate of 2.7.    1. Sepsis/UTI. hemodynamically stable. Lactic acid trending down. febrile overnight  -we will cont IV atx pend cultures results. Prelim blood cultures NGTD. Cont IVF as needed   2. H/o lumbar spine abscess. follows with Dr Johnnye Sima. On chronic doxycycline.  -Patient denies any new/ or worsening back pains. CRP-1.2. ESR-32.  3. Chronic pain syndrome. Continue pain control with baclofen, Neurontin and oxycodone. Continue Zanaflex 4. Essential hypertension Blood pressure stable. Continue Coreg. 5. Anxiety Continue low-dose Ativan 6. Diabetes mellitus Not on any medications. Monitor on SSI  Code Status: full Family Communication:  D/w patient.  (indicate person spoken with, relationship, and if by phone, the number) Disposition Plan: pend clinical improvement    Consultants:  Admitting d/w ID consult Dr. Megan Salon on the phone patient was given a dose of IV ertapenem and IV ciprofloxacin (patient refused to be given vancomycin stating that she developed a diffuse rash at the facility after receiving vancomycin).   Procedures:  none  Antibiotics:  Ertapenem 6/13>>>   (indicate start date, and stop date if known)  HPI/Subjective: Alert, no distress   Objective: Filed  Vitals:   09/09/14 0913  BP: 101/50  Pulse: 81  Temp: 99.7 F (37.6 C)  Resp:     Intake/Output Summary (Last 24 hours) at 09/09/14 1107 Last data filed at 09/09/14 0900  Gross per 24 hour  Intake 1986.67 ml  Output   2300 ml  Net -313.33 ml   Filed Weights   09/08/14 0434 09/08/14 1000  Weight: 127.007 kg (280 lb) 117.028 kg (258 lb)    Exam:   General:  No distress   Cardiovascular: s1,s2 rrr  Respiratory: CTA BL  Abdomen: soft, nt,nd   Musculoskeletal: mild chronic edema   Data Reviewed: Basic Metabolic Panel:  Recent Labs Lab 09/08/14 0439  NA 134*  K 4.3  CL 96*  CO2 27  GLUCOSE 213*  BUN 15  CREATININE 0.51  CALCIUM 8.4*   Liver Function Tests:  Recent Labs Lab 09/08/14 0439  AST 25  ALT 18  ALKPHOS 61  BILITOT 0.9  PROT 7.2  ALBUMIN 3.3*   No results for input(s): LIPASE, AMYLASE in the last 168 hours. No results for input(s): AMMONIA in the last 168 hours. CBC:  Recent Labs Lab 09/08/14 0439 09/09/14 0557  WBC 14.0* 5.2  NEUTROABS 12.3*  --   HGB 11.7* 9.4*  HCT 38.0 31.1*  MCV 83.3 83.2  PLT 189 144*   Cardiac Enzymes: No results for input(s): CKTOTAL, CKMB, CKMBINDEX, TROPONINI in the last 168 hours. BNP (last 3 results)  Recent Labs  03/29/14 0429  BNP 73.0    ProBNP (last 3 results) No results for input(s): PROBNP in the last 8760 hours.  CBG:  Recent Labs Lab 09/08/14 1612 09/08/14 2119 09/09/14 0015  09/09/14 0336 09/09/14 0745  GLUCAP 122* 85 123* 111* 107*    Recent Results (from the past 240 hour(s))  Blood Culture (routine x 2)     Status: None (Preliminary result)   Collection Time: 09/08/14  4:55 AM  Result Value Ref Range Status   Specimen Description BLOOD LEFT HAND  Final   Special Requests BOTTLES DRAWN AEROBIC ONLY 6CC  Final   Culture NO GROWTH 1 DAY  Final   Report Status PENDING  Incomplete  Blood Culture (routine x 2)     Status: None (Preliminary result)   Collection Time:  09/08/14  5:05 AM  Result Value Ref Range Status   Specimen Description BLOOD LEFT HAND  Final   Special Requests BOTTLES DRAWN AEROBIC ONLY 6CC  Final   Culture NO GROWTH 1 DAY  Final   Report Status PENDING  Incomplete  Urine culture     Status: None   Collection Time: 09/08/14  5:20 AM  Result Value Ref Range Status   Specimen Description URINE, CATHETERIZED  Final   Special Requests NONE  Final   Colony Count   Final    75,000 COLONIES/ML Performed at [facility]    Culture   Final    DIPHTHEROIDS(CORYNEBACTERIUM SPECIES) Note: Standardized susceptibility testing for this organism is not available. Performed at [facility]    Report Status 09/09/2014 FINAL  Final  MRSA PCR Screening     Status: None   Collection Time: 09/08/14 11:11 AM  Result Value Ref Range Status   MRSA by PCR NEGATIVE NEGATIVE Final    Comment:        The GeneXpert MRSA Assay (FDA approved for NASAL specimens only), is one component of a comprehensive MRSA colonization surveillance program. It is not intended to diagnose MRSA infection nor to guide or monitor treatment for MRSA infections.      Studies: Dg Chest Port 1 View  09/08/2014   CLINICAL DATA:  Code sepsis, fever for 2 days. History of hypertension, diabetes, discitis.  EXAM: PORTABLE CHEST - 1 VIEW  COMPARISON:  Chest radiograph March 31, 2014  FINDINGS: Cardiomediastinal silhouette is unremarkable, patient is rotated to the RIGHT. No pleural effusion or focal consolidation. No pneumothorax. Moderate LEFT acromioclavicular osteoarthrosis. Soft tissue planes are nonsuspicious.  IMPRESSION: No acute cardiopulmonary process.   Electronically Signed   By: Elon Alas M.D.   On: 09/08/2014 05:21    Scheduled Meds: . acidophilus  1 capsule Oral Daily  . aspirin EC  81 mg Oral Daily  . B-complex with vitamin C  1 tablet Oral Daily  . baclofen  10 mg Oral BID  . buPROPion  150 mg Oral Daily  . calcium-vitamin D  1  tablet Oral BID  . carvedilol  12.5 mg Oral BID WC  . cholecalciferol  1,000 Units Oral Daily  . colchicine  0.6 mg Oral BID  . doxycycline  100 mg Oral BID  . enoxaparin (LOVENOX) injection  40 mg Subcutaneous Daily  . ertapenem  1 g Intravenous Q24H  . ezetimibe  10 mg Oral Daily  . feeding supplement (PRO-STAT SUGAR FREE 64)  30 mL Oral TID WC  . fluticasone  1 spray Each Nare Daily  . furosemide  40 mg Oral Daily  . gabapentin  300 mg Oral TID  . insulin aspart  0-9 Units Subcutaneous TID WC  . iron polysaccharides  150 mg Oral Daily  . levothyroxine  225 mcg Oral Q24H  .  loratadine  10 mg Oral Daily  . magnesium oxide  400 mg Oral BID  . montelukast  10 mg Oral Daily  . omega-3 acid ethyl esters  1 g Oral BID  . pantoprazole  40 mg Oral Daily  . QUEtiapine  62.5 mg Oral QHS  . senna-docusate  1 tablet Oral Daily  . sodium chloride  3 mL Intravenous Q12H   Continuous Infusions: . sodium chloride 100 mL/hr at 09/09/14 0250    Principal Problem:   Sepsis Active Problems:   Bipolar 1 disorder   Diabetes mellitus without complication   Morbid obesity   GERD (gastroesophageal reflux disease)   Depression   UTI (urinary tract infection)   Chronic indwelling Foley catheter   Essential hypertension   Abscess in epidural space of lumbar spine   Chronic paraplegia    Time spent: >35 minutes     Kinnie Feil  [facility] Pager [phone]. If 7PM-7AM, please contact night-coverage at [email or web address], password [facility] 09/09/2014, 11:07 AM  LOS: 1 day

## 2014-09-09 NOTE — Progress Notes (Signed)
UR chart review completed.  

## 2014-09-09 NOTE — Clinical Social Work Note (Signed)
Clinical Social Work Assessment  Patient Details  Name: [name] MRN: [id number] Date of Birth: 28-Oct-1945  Date of referral:  09/09/14               Reason for consult:  Facility Placement                Permission sought to share information with:    Permission granted to share information::     Name::        Agency::     Relationship::     Contact Information:     Housing/Transportation Living arrangements for the past 2 months:  [place] of Information:  Patient, Facility Patient Interpreter Needed:  None Criminal Activity/Legal Involvement Pertinent to Current Situation/Hospitalization:  No - Comment as needed Significant Relationships:  Adult Children Lives with:  Facility Resident Do you feel safe going back to the place where you live?  Yes Need for family participation in patient care:  Yes (Comment)  Care giving concerns:  Pt is long term resident at [facility]    Social Worker assessment / plan:  CSW met with pt at bedside. Pt alert and oriented and known to CSW from previous admissions. Pt has been resident at [facility] since 2009. She has limited support, but does indicate that she speaks to her daughter "every once in awhile." Pt states she has notified her daughter of admission. Admitted with sepsis. Pt reports she is feeling better today. Per Jackelyn [facility] at facility, pt is nursing level of care and okay to return. She used to use lift to get out of bed, but has been bed bound for some time now.   Employment status:  Disabled (Comment on whether or not currently receiving Disability) Insurance information:  Medicare PT Recommendations:  Not assessed at this time Information / Referral to community resources:  [place] (return to [facility])  Patient/Family's Response to care:  Pt requests return to [facility] at d/c. She reports she now considers this "home".   Patient/Family's Understanding of and Emotional Response to Diagnosis, Current  Treatment, and Prognosis:  Pt reports understanding of admissions diagnosis and treatment plan. She shared story of medical history and paraplegia. Support provided.   Emotional Assessment Appearance:  Appears stated age Attitude/Demeanor/Rapport:  Other (Cooperative) Affect (typically observed):  Accepting Orientation:  Oriented to Self, Oriented to Place, Oriented to  Time, Oriented to Situation Alcohol / Substance use:  Not Applicable Psych involvement (Current and /or in the community):  No (Comment)  Discharge Needs  Concerns to be addressed:  Discharge Planning Concerns Readmission within the last 30 days:  No Current discharge risk:  Chronically ill Barriers to Discharge:  Continued Medical Work up   [employer], [employer], [place] 09/09/2014, 9:11 AM [phone]

## 2014-09-10 DIAGNOSIS — A419 Sepsis, unspecified organism: Principal | ICD-10-CM

## 2014-09-10 LAB — CBC
HCT: 31.7 % — ABNORMAL LOW (ref 36.0–46.0)
Hemoglobin: 9.6 g/dL — ABNORMAL LOW (ref 12.0–15.0)
MCH: 25.3 pg — ABNORMAL LOW (ref 26.0–34.0)
MCHC: 30.3 g/dL (ref 30.0–36.0)
MCV: 83.4 fL (ref 78.0–100.0)
Platelets: 129 10*3/uL — ABNORMAL LOW (ref 150–400)
RBC: 3.8 MIL/uL — AB (ref 3.87–5.11)
RDW: 15.8 % — AB (ref 11.5–15.5)
WBC: 4.5 10*3/uL (ref 4.0–10.5)

## 2014-09-10 LAB — BASIC METABOLIC PANEL
ANION GAP: 5 (ref 5–15)
BUN: 10 mg/dL (ref 6–20)
CO2: 28 mmol/L (ref 22–32)
Calcium: 8.1 mg/dL — ABNORMAL LOW (ref 8.9–10.3)
Chloride: 105 mmol/L (ref 101–111)
Creatinine, Ser: 0.34 mg/dL — ABNORMAL LOW (ref 0.44–1.00)
GFR calc Af Amer: >60 mL/min (ref >60)
Glucose, Bld: 135 mg/dL — ABNORMAL HIGH (ref 65–99)
POTASSIUM: 4.2 mmol/L (ref 3.5–5.1)
SODIUM: 138 mmol/L (ref 135–145)

## 2014-09-10 LAB — GLUCOSE, CAPILLARY
GLUCOSE-CAPILLARY: 126 mg/dL — AB (ref 65–99)
Glucose-Capillary: 97 mg/dL (ref 65–99)
Glucose-Capillary: 105 mg/dL — ABNORMAL HIGH (ref 65–99)

## 2014-09-10 MED ORDER — CIPROFLOXACIN HCL 250 MG PO TABS: 250.0000 mg | ORAL_TABLET | Freq: Two times a day (BID) | ORAL | Status: DC

## 2014-09-10 NOTE — Progress Notes (Signed)
Pt discharged to [facility] facility. Report called to [facility]

## 2014-09-10 NOTE — Clinical Social Work Note (Signed)
Pt d/c today back to [facility]. Pt and facility aware and agreeable. D/C summary faxed. Pt will transfer via [facility]. CSW offered to call pt's daughter and she reports no need to call.   Benay Pike, [place]

## 2014-09-10 NOTE — Care Management Note (Signed)
Case Management Note  Patient Details  Name: [name] MRN: [id number] Date of Birth: 29-Dec-1945  Subjective/Objective:                    Action/Plan:   Expected Discharge Date:                  Expected Discharge Plan:  Skilled Nursing Facility  In-House Referral:  Clinical Social Work  Discharge planning Services  CM Consult  Post Acute Care Choice:  NA Choice offered to:  NA  DME Arranged:    DME Agency:     HH Arranged:    [place] Agency:     Status of Service:  Completed, signed off  Medicare Important Message Given:    Date Medicare IM Given:    Medicare IM give by:    Date Additional Medicare IM Given:    Additional Medicare Important Message give by:     If discussed at [place] of Stay Meetings, dates discussed:    Additional Comments: Pt discharged back to [facility] today. CSW to arrange discharge to facility. Christinia Gully [place], RN 09/10/2014, 4:08 PM

## 2014-09-10 NOTE — Clinical Documentation Improvement (Signed)
Presents with Sepsis from UTI; has indwelling foley which was changed in the ED.  Please clarify if causal relationship between   Sepsis: secondary to UTI secondary to indwelling foley               Sepsis with UTI as documented               Other Condition               Cannot Clinically Determine  Thank You, Zoila Shutter ,RN Clinical Documentation Specialist:  [facility]

## 2014-09-10 NOTE — Discharge Summary (Signed)
Physician Discharge Summary  [name] EHU:[id number] DOB: 03-26-1946 DOA: 09/08/2014  PCP: [name], MD  Admit date: 09/08/2014 Discharge date: 09/10/2014  Time spent: 45 minutes  Recommendations for Outpatient Follow-up:  -We discharge back to SNF today. -Continue Cipro for 7 days.   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 Patient currently on prolonged therapy with oral doxycycline and follows with ID Dr. Johnnye Sima as outpatient. The hospital course was also complicated by C. difficile colitis that was treated. Patient reports having fever off and on for the past 2-3 days and had a maximum temperature of 101F at the facility. At the facility she had a white count of 24,000 and was given an empiric dose of ertapenem and Levaquin. She reports having headache and nausea for the past 2 weeks. She is also on pain medications for her chronic low back pain which has not worsened. She denies any cough, shortness of breath, chills, chest pain, palpitations, vomiting, dysuria or diarrhea. She has a chronic suprapubic catheter that was changed 2  weeks back. She has chronic bowel incontinence and cannot feel anything below her umbilicus. She denies any diarrhea.  Hospital Course:   Sepsis secondary to UTI -Hemodynamically stable, sepsis parameters improved.  -Urine culture with diphtheroids and blood cultures remain negative to date. -Discussed case with infectious diseases, Dr. Megan Salon, via phone who recommended that without culture data to proceed with a seven-day course of Cipro to treat her UTI and continue doxycycline for her spinal abscess/osteomyelitis until follows up with Dr. Johnnye Sima.  Spinal osteomyelitis/abscess -Follows with Dr. Johnnye Sima, continue chronic doxycycline.  Rest of chronic conditions have been stable.  Procedures:  None   Consultations:  None  Discharge Instructions  Discharge Instructions    Diet - low sodium heart healthy    Complete by:  As directed      Increase activity slowly    Complete by:  As directed             Medication List    TAKE these medications        acetaminophen 500 MG tablet  Commonly known as:  TYLENOL  Take 500 mg by mouth every 6 (six) hours as needed for pain.     acidophilus Caps capsule  Take 1 capsule by mouth daily.     alendronate 70 MG tablet  Commonly known as:  FOSAMAX  Take 70 mg by mouth once a week. Take with a full glass of water on an empty stomach.     alum & mag hydroxide-simeth 200-200-20 MG/5ML suspension  Commonly known as:  MAALOX/MYLANTA  Take 30 mLs by mouth every 4 (four) hours  as needed for indigestion or heartburn.     aspirin EC 81 MG tablet  Take 81 mg by mouth daily.     b complex vitamins tablet  Take 1 tablet by mouth daily.     baclofen 10 MG tablet  Commonly known as:  LIORESAL  Take 10 mg by mouth 2 (two) times daily.     buPROPion 150 MG 24 hr tablet  Commonly known as:  WELLBUTRIN XL  Take 150 mg by mouth daily.     calcium citrate-vitamin D 500-400 MG-UNIT chewable tablet  Chew 1 tablet by mouth 2 (two)  times daily.     carvedilol 12.5 MG tablet  Commonly known as:  COREG  Take 12.5 mg by mouth 2 (two) times daily with a meal.     cetirizine 10 MG tablet  Commonly known as:  ZYRTEC  Take 10 mg by mouth daily.     cholecalciferol 1000 UNITS tablet  Commonly known as:  VITAMIN D  Take 1,000 Units by mouth daily.     ciprofloxacin 250 MG tablet  Commonly known as:  CIPRO  Take 1 tablet (250 mg total) by mouth 2 (two) times daily.     colchicine 0.6 MG tablet  Take 0.6 mg by mouth 2 (two) times daily.     Cranberry 475 MG Caps  Take 1 capsule by mouth daily.     dextromethorphan-guaiFENesin 30-600 MG per 12 hr tablet  Commonly known as:  MUCINEX DM  Take 1 tablet by mouth 2 (two) times daily as needed for cough.     diphenhydrAMINE 12.5 MG chewable tablet  Commonly known as:  BENADRYL  Chew 12.5 mg by mouth 4 (four) times daily as needed for allergies.     doxycycline 100 MG tablet  Commonly known as:  VIBRA-TABS  Take 1 tablet (100 mg total) by mouth 2 (two) times daily.     ezetimibe 10 MG tablet  Commonly known as:  ZETIA  Take 10 mg by mouth daily.     feeding supplement (PRO-STAT SUGAR FREE 64) Liqd  Take 30 mLs by mouth 3 (three) times daily with meals.     fentaNYL 25 MCG/HR patch  Commonly known as:  DURAGESIC - dosed mcg/hr  Place 25 mcg onto the skin every 3 (three) days.     fluticasone 50 MCG/ACT nasal spray  Commonly known as:  FLONASE  Place 2 sprays into both nostrils daily.     furosemide 40 MG tablet  Commonly known as:  LASIX  Take 40 mg by mouth daily.     gabapentin 300 MG capsule  Commonly known as:  NEURONTIN  Take 300 mg by mouth 3 (three) times daily.     hydrOXYzine 25 MG tablet  Commonly known as:  ATARAX/VISTARIL  Take 25 mg by mouth every 6 (six) hours as needed for itching.     ipratropium-albuterol 0.5-2.5 (3) MG/3ML Soln  Commonly known as:  DUONEB  Take 3 mLs by nebulization every 6 (six) hours as needed (shortness of  breath).     Krill Oil 300 MG Caps  Take 300 mg by mouth 2 (two) times daily.     levothyroxine 25 MCG tablet  Commonly known as:  SYNTHROID, LEVOTHROID  Take 25 mcg by mouth daily. Takes with Synthroid (Levothyroxine) 259mcg for a total of 213mcg daily at 6:00am     levothyroxine 200 MCG tablet  Commonly known as:  SYNTHROID, LEVOTHROID  Take 200 mcg by mouth daily.  Takes with Synthroid (Levothyroxine) 25 mcg for a total of 27mcg daily at 6:00am     LORazepam 0.5 MG tablet  Commonly known as:  ATIVAN  Take 0.5 tablets (0.25 mg total) by mouth every 6 (six) hours as needed for anxiety.     magnesium oxide 400 MG tablet  Commonly known as:  MAG-OX  Take 400 mg by mouth 2 (two) times daily.     montelukast 10 MG tablet  Commonly known as:  SINGULAIR  Take 10 mg by mouth daily.     omeprazole 20 MG capsule  Commonly known as:  PRILOSEC  Take 20 mg by mouth daily.     oxycodone 5 MG capsule  Commonly known as:  OXY-IR  Take 1 capsule (5 mg total) by mouth every 6 (six) hours as needed.     POLY-IRON 150 150 MG capsule  Generic drug:  iron polysaccharides  Take 150 mg by mouth daily.     promethazine 12.5 MG tablet  Commonly known as:  PHENERGAN  Take 12.5 mg by mouth every 6 (six) hours as needed for nausea or vomiting.     PROVENTIL HFA 108 (90 BASE) MCG/ACT inhaler  Generic drug:  albuterol  Inhale 2 puffs into the lungs every 8 (eight) hours as needed (Asthma).     QUEtiapine 50 MG tablet  Commonly known as:  SEROQUEL  Take 50 mg by mouth at bedtime.     rizatriptan 10 MG tablet  Commonly known as:  MAXALT  Take 1 tablet by mouth every 8 (eight) hours.     SEA-OMEGA PO  Take 1 capsule by mouth 2 (two) times daily.     sennosides-docusate sodium 8.6-50 MG tablet  Commonly known as:  SENOKOT-S  Take 1 tablet by mouth daily.     tiZANidine 2 MG tablet  Commonly known as:  ZANAFLEX  Take 2 mg by mouth every 6 (six) hours as needed for muscle spasms.      traMADol 50 MG tablet  Commonly known as:  ULTRAM  Take by mouth every 6 (six) hours as needed.     vitamin C 500 MG tablet  Commonly known as:  ASCORBIC ACID  Take 500 mg by mouth 2 (two) times daily.       Allergies  Allergen Reactions  . Codeine   . Propranolol Hcl   . Rocephin [Ceftriaxone] Hives  . Vancomycin Hives      The results of significant diagnostics from this hospitalization (including imaging, microbiology, ancillary and laboratory) are listed below for reference.    Significant Diagnostic Studies: Dg Chest Port 1 View  09/08/2014   CLINICAL DATA:  Code sepsis, fever for 2 days. History of hypertension, diabetes, discitis.  EXAM: PORTABLE CHEST - 1 VIEW  COMPARISON:  Chest radiograph March 31, 2014  FINDINGS: Cardiomediastinal silhouette is unremarkable, patient is rotated to the RIGHT. No pleural effusion or focal consolidation. No pneumothorax. Moderate LEFT acromioclavicular osteoarthrosis. Soft tissue planes are nonsuspicious.  IMPRESSION: No acute cardiopulmonary process.   Electronically Signed   By: Elon Alas M.D.   On: 09/08/2014 05:21    Microbiology: Recent Results (from the past 240 hour(s))  Blood Culture (routine x 2)     Status: None (Preliminary result)   Collection Time: 09/08/14  4:55 AM  Result Value Ref Range Status   Specimen Description BLOOD LEFT HAND  Final   Special Requests BOTTLES DRAWN AEROBIC ONLY 6CC  Final   Culture NO GROWTH 2 DAYS  Final   Report Status PENDING  Incomplete  Blood Culture (routine x 2)     Status: None (Preliminary result)   Collection Time: 09/08/14  5:05 AM  Result Value Ref Range Status   Specimen Description BLOOD LEFT HAND  Final   Special Requests BOTTLES DRAWN AEROBIC ONLY 6CC  Final   Culture NO GROWTH 2 DAYS  Final   Report Status PENDING  Incomplete  Urine culture     Status: None   Collection Time: 09/08/14  5:20 AM  Result Value Ref Range Status   Specimen Description URINE, CATHETERIZED   Final   Special Requests NONE  Final   Colony Count   Final    75,000 COLONIES/ML Performed at [facility]    Culture   Final    DIPHTHEROIDS(CORYNEBACTERIUM SPECIES) Note: Standardized susceptibility testing for this organism is not available. Performed at [facility]    Report Status 09/09/2014 FINAL  Final  MRSA PCR Screening     Status: None   Collection Time: 09/08/14 11:11 AM  Result Value Ref Range Status   MRSA by PCR NEGATIVE NEGATIVE Final    Comment:        The GeneXpert MRSA Assay (FDA approved for NASAL specimens only), is one component of a comprehensive MRSA colonization surveillance program. It is not intended to diagnose MRSA infection nor to guide or monitor treatment for MRSA infections.      Labs: Basic Metabolic Panel:  Recent Labs Lab 09/08/14 0439 09/10/14 0618  NA 134* 138  K 4.3 4.2  CL 96* 105  CO2 27 28  GLUCOSE 213* 135*  BUN 15 10  CREATININE 0.51 0.34*  CALCIUM 8.4* 8.1*   Liver Function Tests:  Recent Labs Lab 09/08/14 0439  AST 25  ALT 18  ALKPHOS 61  BILITOT 0.9  PROT 7.2  ALBUMIN 3.3*   No results for input(s): LIPASE, AMYLASE in the last 168 hours. No results for input(s): AMMONIA in the last 168 hours. CBC:  Recent Labs Lab 09/08/14 0439 09/09/14 0557 09/10/14 0618  WBC 14.0* 5.2 4.5  NEUTROABS 12.3*  --   --   HGB 11.7* 9.4* 9.6*  HCT 38.0 31.1* 31.7*  MCV 83.3 83.2 83.4  PLT 189 144* 129*   Cardiac Enzymes: No results for input(s): CKTOTAL, CKMB, CKMBINDEX, TROPONINI in the last 168 hours. BNP: BNP (last 3 results)  Recent Labs  03/29/14 0429  BNP 73.0    ProBNP (last 3 results) No results for input(s): PROBNP in the last 8760 hours.  CBG:  Recent Labs Lab 09/09/14 1133 09/09/14 1645 09/09/14 2112 09/10/14 0738 09/10/14 1119  GLUCAP 121* 101* 137* 126* 97       Signed:  HERNANDEZ ACOSTA,Beata Beason  [facility] Pager: [phone] 09/10/2014, 4:04  PM

## 2014-09-10 NOTE — Plan of Care (Signed)
Problem: Discharge Progression Outcomes Goal: Discharge plan in place and appropriate Outcome: Completed/Met Date Met:  09/10/14 [facility]

## 2014-09-15 DIAGNOSIS — F419 Anxiety disorder, unspecified: Secondary | ICD-10-CM | POA: Diagnosis not present

## 2014-09-15 DIAGNOSIS — F3130 Bipolar disorder, current episode depressed, mild or moderate severity, unspecified: Secondary | ICD-10-CM | POA: Diagnosis not present

## 2014-09-15 DIAGNOSIS — N390 Urinary tract infection, site not specified: Secondary | ICD-10-CM | POA: Diagnosis not present

## 2014-09-15 LAB — CULTURE, BLOOD (ROUTINE X 2)
CULTURE: NO GROWTH
Culture: NO GROWTH

## 2014-10-08 DIAGNOSIS — R319 Hematuria, unspecified: Secondary | ICD-10-CM | POA: Diagnosis not present

## 2014-10-08 DIAGNOSIS — D649 Anemia, unspecified: Secondary | ICD-10-CM | POA: Diagnosis not present

## 2014-10-08 DIAGNOSIS — E119 Type 2 diabetes mellitus without complications: Secondary | ICD-10-CM | POA: Diagnosis not present

## 2014-10-08 DIAGNOSIS — Z79899 Other long term (current) drug therapy: Secondary | ICD-10-CM | POA: Diagnosis not present

## 2014-10-08 DIAGNOSIS — I10 Essential (primary) hypertension: Secondary | ICD-10-CM | POA: Diagnosis not present

## 2014-10-08 DIAGNOSIS — R6889 Other general symptoms and signs: Secondary | ICD-10-CM | POA: Diagnosis not present

## 2014-10-08 DIAGNOSIS — N311 Reflex neuropathic bladder, not elsewhere classified: Secondary | ICD-10-CM | POA: Diagnosis not present

## 2014-10-08 DIAGNOSIS — N390 Urinary tract infection, site not specified: Secondary | ICD-10-CM | POA: Diagnosis not present

## 2014-10-10 DIAGNOSIS — N311 Reflex neuropathic bladder, not elsewhere classified: Secondary | ICD-10-CM | POA: Diagnosis not present

## 2014-10-10 DIAGNOSIS — R6889 Other general symptoms and signs: Secondary | ICD-10-CM | POA: Diagnosis not present

## 2014-10-10 DIAGNOSIS — D649 Anemia, unspecified: Secondary | ICD-10-CM | POA: Diagnosis not present

## 2014-10-10 DIAGNOSIS — N390 Urinary tract infection, site not specified: Secondary | ICD-10-CM | POA: Diagnosis not present

## 2014-10-13 DIAGNOSIS — M109 Gout, unspecified: Secondary | ICD-10-CM | POA: Diagnosis not present

## 2014-10-13 DIAGNOSIS — R079 Chest pain, unspecified: Secondary | ICD-10-CM | POA: Diagnosis not present

## 2014-10-13 DIAGNOSIS — R6889 Other general symptoms and signs: Secondary | ICD-10-CM | POA: Diagnosis not present

## 2014-10-13 DIAGNOSIS — G061 Intraspinal abscess and granuloma: Secondary | ICD-10-CM | POA: Diagnosis not present

## 2014-10-13 DIAGNOSIS — R05 Cough: Secondary | ICD-10-CM | POA: Diagnosis not present

## 2014-10-14 DIAGNOSIS — J00 Acute nasopharyngitis [common cold]: Secondary | ICD-10-CM | POA: Diagnosis not present

## 2014-10-14 DIAGNOSIS — D72829 Elevated white blood cell count, unspecified: Secondary | ICD-10-CM | POA: Diagnosis not present

## 2014-10-14 DIAGNOSIS — R6889 Other general symptoms and signs: Secondary | ICD-10-CM | POA: Diagnosis not present

## 2014-10-14 DIAGNOSIS — R079 Chest pain, unspecified: Secondary | ICD-10-CM | POA: Diagnosis not present

## 2014-10-16 DIAGNOSIS — R6889 Other general symptoms and signs: Secondary | ICD-10-CM | POA: Diagnosis not present

## 2014-10-16 DIAGNOSIS — J00 Acute nasopharyngitis [common cold]: Secondary | ICD-10-CM | POA: Diagnosis not present

## 2014-10-16 DIAGNOSIS — E871 Hypo-osmolality and hyponatremia: Secondary | ICD-10-CM | POA: Diagnosis not present

## 2014-10-16 DIAGNOSIS — R079 Chest pain, unspecified: Secondary | ICD-10-CM | POA: Diagnosis not present

## 2014-10-20 ENCOUNTER — Encounter: Payer: Self-pay | Admitting: Infectious Diseases

## 2014-10-20 ENCOUNTER — Ambulatory Visit (INDEPENDENT_AMBULATORY_CARE_PROVIDER_SITE_OTHER): Payer: Medicare Other | Admitting: Infectious Diseases

## 2014-10-20 ENCOUNTER — Telehealth: Payer: Self-pay | Admitting: *Deleted

## 2014-10-20 VITALS — BP 106/72 | HR 75 | Temp 98.4°F

## 2014-10-20 DIAGNOSIS — M869 Osteomyelitis, unspecified: Secondary | ICD-10-CM

## 2014-10-20 NOTE — Assessment & Plan Note (Signed)
She appears to be doing well.  Will repeat her MRI and call her with results. Determine length of doxy based on result of this study. Will not schedule f/u appt til we have MRI results.

## 2014-10-20 NOTE — Addendum Note (Signed)
Addended by: Landis Gandy on: 10/20/2014 05:03 PM   Modules accepted: Orders

## 2014-10-20 NOTE — Progress Notes (Signed)
   Subjective:    Patient ID: [name], female    DOB: 1945-06-07, 69 y.o.   MRN: [id number]  HPI 69 yo M previously hospitalized 2-10 to 05-20-14 with paraplegia (since 2008) and L2-3 fluid collection and "destructive" process. She also had a fluid collection at T4-11. Her course was complicated by C diff. She had flouro guided aspirate of L2-3 on 2-18 that was negative for routine, afb. She was treated with oral vancomycin, IV vanco/ceftriaxone. She was planned to receive 6 weeks of anbx.  She was d/c to SNF and then changed to daptomycin/ceftriaxone, which she completed on 06-28-14. She was started on prednisone taper as well. She completed po vanco taper on 07-01-14.   She had repeat MRI on 07-01-14 which showed "extensive fluid collection in the thoracic spine from T3 through 11, probably intradural and likely related to previous hemorrhages and surgery. This results in severe compression of the spinal cord throughout the spinal cord down to the T11 level. ..."this probably does not represent epidural abscess although I cannot be certain. 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 There is a disc destruction and a large amount of fluid within the disc space at this level. This could represent discitis/osteomyelitis. There is enhancement of the dura throughout the lumbar spine..." She was last seen in ID on 4-25 and was placed on doxy for 3 months. Her intervening course was complicated by urosepsis (hospitalized 6-13 to 6-14, UCx diphtheroids).   States she had bladder infection last week- took invanz 3 days IV then IM and po for 7 days. She continues to take doxy.  No fever or chills since. Did feel "cool" one day.  No back pain.   Review of Systems Has indwelling catheter, no change in urine- no odor, cloudiness, blood.  Bowels  have been loose but not diarrhea.  Glc was 218 at recent blood draw- states she does not have DM and takes no rx for it.     Objective:   Physical Exam  Constitutional: She appears well-developed and well-nourished.  HENT:  Mouth/Throat: No oropharyngeal exudate.  Eyes: EOM are normal. Pupils are equal, round, and reactive to light.  Neck: Neck supple.  Cardiovascular: Normal rate, regular rhythm and normal heart sounds.   Pulmonary/Chest: Effort normal and breath sounds normal.  Abdominal: Soft. Bowel sounds are normal. She exhibits no distension. There is no tenderness.  Lymphadenopathy:    She has no cervical adenopathy.      Assessment & Plan:

## 2014-10-20 NOTE — Telephone Encounter (Signed)
[facility] will need to call patient to screen and schedule patient for MRI.  RN faxed order to Preston at [facility] [phone]).  They will contact us with the appointment.  Patient is paraplegic, [facility] aware.  Will assess whether they can provide the service or if she needs to go to [facility] Landis Gandy, RN

## 2014-10-23 NOTE — Telephone Encounter (Signed)
Per [facility], they have been unsuccessful in getting in touch with someone to answer the patient's screening questions. RN attempted to call patient (home number in chart is [facility], she is in room A29).  Reached the nurse April.  April will contact [facility] to answer the screening questions, help facilitate the MRI to be scheduled.

## 2014-10-29 DIAGNOSIS — E039 Hypothyroidism, unspecified: Secondary | ICD-10-CM | POA: Diagnosis not present

## 2014-10-29 DIAGNOSIS — R6889 Other general symptoms and signs: Secondary | ICD-10-CM | POA: Diagnosis not present

## 2014-11-04 DIAGNOSIS — L84 Corns and callosities: Secondary | ICD-10-CM | POA: Diagnosis not present

## 2014-11-04 DIAGNOSIS — E1151 Type 2 diabetes mellitus with diabetic peripheral angiopathy without gangrene: Secondary | ICD-10-CM | POA: Diagnosis not present

## 2014-11-08 DIAGNOSIS — G43909 Migraine, unspecified, not intractable, without status migrainosus: Secondary | ICD-10-CM | POA: Diagnosis not present

## 2014-11-08 DIAGNOSIS — J309 Allergic rhinitis, unspecified: Secondary | ICD-10-CM | POA: Diagnosis not present

## 2014-11-11 ENCOUNTER — Ambulatory Visit
Admission: RE | Admit: 2014-11-11 | Discharge: 2014-11-11 | Disposition: A | Discharge status: Home / Self Care | Payer: Medicare Other | Source: Ambulatory Visit | Attending: Infectious Diseases | Admitting: Infectious Diseases

## 2014-11-11 DIAGNOSIS — B999 Unspecified infectious disease: Secondary | ICD-10-CM | POA: Diagnosis not present

## 2014-11-11 DIAGNOSIS — M869 Osteomyelitis, unspecified: Secondary | ICD-10-CM

## 2014-11-11 DIAGNOSIS — Z7401 Bed confinement status: Secondary | ICD-10-CM | POA: Diagnosis not present

## 2014-11-11 DIAGNOSIS — R279 Unspecified lack of coordination: Secondary | ICD-10-CM | POA: Diagnosis not present

## 2014-12-30 ENCOUNTER — Telehealth: Payer: Self-pay | Admitting: Infectious Diseases

## 2014-12-30 NOTE — Telephone Encounter (Signed)
Estill Bamberg RN at [facility] facility called to see if the patient could stop taking the Doxycycline, per RN MRI was normal. Per last note length of therapy would be determined due to imaging results. Please advise

## 2014-12-31 NOTE — Telephone Encounter (Signed)
She can stop thanks

## 2014-12-31 NOTE — Telephone Encounter (Signed)
Gave order to [facility], thank you

## 2015-01-07 DIAGNOSIS — R140 Abdominal distension (gaseous): Secondary | ICD-10-CM | POA: Diagnosis not present

## 2015-01-21 DIAGNOSIS — K219 Gastro-esophageal reflux disease without esophagitis: Secondary | ICD-10-CM | POA: Diagnosis not present

## 2015-01-27 DEATH — deceased

## 2015-01-28 DIAGNOSIS — L84 Corns and callosities: Secondary | ICD-10-CM | POA: Diagnosis not present

## 2015-01-28 DIAGNOSIS — E1151 Type 2 diabetes mellitus with diabetic peripheral angiopathy without gangrene: Secondary | ICD-10-CM | POA: Diagnosis not present

## 2015-02-11 ENCOUNTER — Encounter (INDEPENDENT_AMBULATORY_CARE_PROVIDER_SITE_OTHER): Payer: Self-pay | Admitting: *Deleted

## 2015-02-17 DIAGNOSIS — K5900 Constipation, unspecified: Secondary | ICD-10-CM | POA: Diagnosis not present

## 2015-03-09 DIAGNOSIS — Z79899 Other long term (current) drug therapy: Secondary | ICD-10-CM | POA: Diagnosis not present

## 2015-03-09 DIAGNOSIS — R4182 Altered mental status, unspecified: Secondary | ICD-10-CM | POA: Diagnosis not present

## 2015-03-10 ENCOUNTER — Encounter (INDEPENDENT_AMBULATORY_CARE_PROVIDER_SITE_OTHER): Payer: Self-pay | Admitting: *Deleted

## 2015-03-10 ENCOUNTER — Ambulatory Visit (INDEPENDENT_AMBULATORY_CARE_PROVIDER_SITE_OTHER): Payer: Medicare Other | Admitting: Internal Medicine

## 2015-04-14 DIAGNOSIS — I739 Peripheral vascular disease, unspecified: Secondary | ICD-10-CM | POA: Diagnosis not present

## 2015-04-14 DIAGNOSIS — L84 Corns and callosities: Secondary | ICD-10-CM | POA: Diagnosis not present

## 2015-04-15 DIAGNOSIS — T83038S Leakage of other indwelling urethral catheter, sequela: Secondary | ICD-10-CM | POA: Diagnosis not present

## 2015-04-15 DIAGNOSIS — G8220 Paraplegia, unspecified: Secondary | ICD-10-CM | POA: Diagnosis not present

## 2015-04-25 DIAGNOSIS — R4182 Altered mental status, unspecified: Secondary | ICD-10-CM | POA: Diagnosis not present

## 2015-04-25 DIAGNOSIS — R05 Cough: Secondary | ICD-10-CM | POA: Diagnosis not present

## 2015-04-25 DIAGNOSIS — I517 Cardiomegaly: Secondary | ICD-10-CM | POA: Diagnosis not present

## 2015-04-26 DIAGNOSIS — N390 Urinary tract infection, site not specified: Secondary | ICD-10-CM | POA: Diagnosis not present

## 2015-04-27 DIAGNOSIS — Z79899 Other long term (current) drug therapy: Secondary | ICD-10-CM | POA: Diagnosis not present

## 2015-04-27 DIAGNOSIS — N390 Urinary tract infection, site not specified: Secondary | ICD-10-CM | POA: Diagnosis not present

## 2015-05-04 DIAGNOSIS — R4182 Altered mental status, unspecified: Secondary | ICD-10-CM | POA: Diagnosis not present

## 2015-05-04 DIAGNOSIS — I10 Essential (primary) hypertension: Secondary | ICD-10-CM | POA: Diagnosis not present

## 2015-05-04 DIAGNOSIS — D649 Anemia, unspecified: Secondary | ICD-10-CM | POA: Diagnosis not present

## 2015-05-14 DIAGNOSIS — R6521 Severe sepsis with septic shock: Secondary | ICD-10-CM | POA: Diagnosis not present

## 2015-06-30 DIAGNOSIS — H04123 Dry eye syndrome of bilateral lacrimal glands: Secondary | ICD-10-CM | POA: Diagnosis not present

## 2015-06-30 DIAGNOSIS — E119 Type 2 diabetes mellitus without complications: Secondary | ICD-10-CM | POA: Diagnosis not present

## 2015-06-30 DIAGNOSIS — Z7951 Long term (current) use of inhaled steroids: Secondary | ICD-10-CM | POA: Diagnosis not present

## 2015-06-30 DIAGNOSIS — H2513 Age-related nuclear cataract, bilateral: Secondary | ICD-10-CM | POA: Diagnosis not present

## 2015-07-06 DIAGNOSIS — M109 Gout, unspecified: Secondary | ICD-10-CM | POA: Diagnosis not present

## 2015-07-06 DIAGNOSIS — J309 Allergic rhinitis, unspecified: Secondary | ICD-10-CM | POA: Diagnosis not present

## 2015-07-06 DIAGNOSIS — J45998 Other asthma: Secondary | ICD-10-CM | POA: Diagnosis not present

## 2015-07-06 DIAGNOSIS — I10 Essential (primary) hypertension: Secondary | ICD-10-CM | POA: Diagnosis not present

## 2015-07-07 DIAGNOSIS — I739 Peripheral vascular disease, unspecified: Secondary | ICD-10-CM | POA: Diagnosis not present

## 2015-07-07 DIAGNOSIS — L84 Corns and callosities: Secondary | ICD-10-CM | POA: Diagnosis not present

## 2015-07-30 DIAGNOSIS — G8221 Paraplegia, complete: Secondary | ICD-10-CM | POA: Diagnosis not present

## 2015-07-30 DIAGNOSIS — K5900 Constipation, unspecified: Secondary | ICD-10-CM | POA: Diagnosis not present

## 2015-09-22 DIAGNOSIS — L84 Corns and callosities: Secondary | ICD-10-CM | POA: Diagnosis not present

## 2015-09-22 DIAGNOSIS — I739 Peripheral vascular disease, unspecified: Secondary | ICD-10-CM | POA: Diagnosis not present

## 2015-09-28 DIAGNOSIS — R4182 Altered mental status, unspecified: Secondary | ICD-10-CM | POA: Diagnosis not present

## 2015-09-28 DIAGNOSIS — N390 Urinary tract infection, site not specified: Secondary | ICD-10-CM | POA: Diagnosis not present

## 2015-09-28 DIAGNOSIS — R6521 Severe sepsis with septic shock: Secondary | ICD-10-CM | POA: Diagnosis not present

## 2015-09-28 DIAGNOSIS — R319 Hematuria, unspecified: Secondary | ICD-10-CM | POA: Diagnosis not present

## 2015-10-05 DIAGNOSIS — N390 Urinary tract infection, site not specified: Secondary | ICD-10-CM | POA: Diagnosis not present

## 2015-10-05 DIAGNOSIS — F319 Bipolar disorder, unspecified: Secondary | ICD-10-CM | POA: Diagnosis not present

## 2015-10-05 DIAGNOSIS — R6889 Other general symptoms and signs: Secondary | ICD-10-CM | POA: Diagnosis not present

## 2015-10-05 DIAGNOSIS — F418 Other specified anxiety disorders: Secondary | ICD-10-CM | POA: Diagnosis not present

## 2015-10-05 DIAGNOSIS — R319 Hematuria, unspecified: Secondary | ICD-10-CM | POA: Diagnosis not present

## 2015-11-07 DIAGNOSIS — F418 Other specified anxiety disorders: Secondary | ICD-10-CM | POA: Diagnosis not present

## 2015-11-07 DIAGNOSIS — I10 Essential (primary) hypertension: Secondary | ICD-10-CM | POA: Diagnosis not present

## 2015-11-07 DIAGNOSIS — F319 Bipolar disorder, unspecified: Secondary | ICD-10-CM | POA: Diagnosis not present

## 2015-11-19 DIAGNOSIS — R51 Headache: Secondary | ICD-10-CM | POA: Diagnosis not present

## 2015-11-19 DIAGNOSIS — G5600 Carpal tunnel syndrome, unspecified upper limb: Secondary | ICD-10-CM | POA: Diagnosis not present

## 2015-12-15 DIAGNOSIS — I739 Peripheral vascular disease, unspecified: Secondary | ICD-10-CM | POA: Diagnosis not present

## 2015-12-15 DIAGNOSIS — E1151 Type 2 diabetes mellitus with diabetic peripheral angiopathy without gangrene: Secondary | ICD-10-CM | POA: Diagnosis not present

## 2015-12-31 DIAGNOSIS — G8221 Paraplegia, complete: Secondary | ICD-10-CM | POA: Diagnosis not present

## 2015-12-31 DIAGNOSIS — M1610 Unilateral primary osteoarthritis, unspecified hip: Secondary | ICD-10-CM | POA: Diagnosis not present

## 2015-12-31 DIAGNOSIS — S72002A Fracture of unspecified part of neck of left femur, initial encounter for closed fracture: Secondary | ICD-10-CM | POA: Diagnosis not present

## 2015-12-31 DIAGNOSIS — R600 Localized edema: Secondary | ICD-10-CM | POA: Diagnosis not present

## 2016-01-01 ENCOUNTER — Emergency Department (HOSPITAL_COMMUNITY)
Admission: EM | Admit: 2016-01-01 | Discharge: 2016-01-01 | Disposition: A | Discharge status: Home / Self Care | Payer: Medicare Other | Attending: Emergency Medicine | Admitting: Emergency Medicine

## 2016-01-01 ENCOUNTER — Emergency Department (HOSPITAL_COMMUNITY): Payer: Medicare Other

## 2016-01-01 ENCOUNTER — Encounter (HOSPITAL_COMMUNITY): Payer: Self-pay | Admitting: Emergency Medicine

## 2016-01-01 DIAGNOSIS — Z79899 Other long term (current) drug therapy: Secondary | ICD-10-CM | POA: Insufficient documentation

## 2016-01-01 DIAGNOSIS — Z87891 Personal history of nicotine dependence: Secondary | ICD-10-CM | POA: Diagnosis not present

## 2016-01-01 DIAGNOSIS — E039 Hypothyroidism, unspecified: Secondary | ICD-10-CM | POA: Diagnosis not present

## 2016-01-01 DIAGNOSIS — S72002A Fracture of unspecified part of neck of left femur, initial encounter for closed fracture: Secondary | ICD-10-CM | POA: Diagnosis not present

## 2016-01-01 DIAGNOSIS — Y999 Unspecified external cause status: Secondary | ICD-10-CM | POA: Insufficient documentation

## 2016-01-01 DIAGNOSIS — Y939 Activity, unspecified: Secondary | ICD-10-CM | POA: Insufficient documentation

## 2016-01-01 DIAGNOSIS — E119 Type 2 diabetes mellitus without complications: Secondary | ICD-10-CM | POA: Diagnosis not present

## 2016-01-01 DIAGNOSIS — M7989 Other specified soft tissue disorders: Secondary | ICD-10-CM | POA: Diagnosis not present

## 2016-01-01 DIAGNOSIS — I10 Essential (primary) hypertension: Secondary | ICD-10-CM | POA: Insufficient documentation

## 2016-01-01 DIAGNOSIS — R4182 Altered mental status, unspecified: Secondary | ICD-10-CM | POA: Diagnosis not present

## 2016-01-01 DIAGNOSIS — S72342A Displaced spiral fracture of shaft of left femur, initial encounter for closed fracture: Secondary | ICD-10-CM | POA: Diagnosis not present

## 2016-01-01 DIAGNOSIS — Y929 Unspecified place or not applicable: Secondary | ICD-10-CM | POA: Diagnosis not present

## 2016-01-01 DIAGNOSIS — Z7401 Bed confinement status: Secondary | ICD-10-CM | POA: Diagnosis not present

## 2016-01-01 DIAGNOSIS — S79922A Unspecified injury of left thigh, initial encounter: Secondary | ICD-10-CM | POA: Diagnosis present

## 2016-01-01 DIAGNOSIS — S72345A Nondisplaced spiral fracture of shaft of left femur, initial encounter for closed fracture: Secondary | ICD-10-CM | POA: Diagnosis not present

## 2016-01-01 DIAGNOSIS — X501XXA Overexertion from prolonged static or awkward postures, initial encounter: Secondary | ICD-10-CM | POA: Diagnosis not present

## 2016-01-01 DIAGNOSIS — R279 Unspecified lack of coordination: Secondary | ICD-10-CM | POA: Diagnosis not present

## 2016-01-01 DIAGNOSIS — J449 Chronic obstructive pulmonary disease, unspecified: Secondary | ICD-10-CM | POA: Insufficient documentation

## 2016-01-01 NOTE — ED Notes (Signed)
Pt transported to xray 

## 2016-01-01 NOTE — ED Notes (Signed)
Pt returned from xray

## 2016-01-01 NOTE — ED Notes (Signed)
EDP at bedside updating patient. 

## 2016-01-01 NOTE — ED Triage Notes (Signed)
Pt from [place]. Per EMS, pt was lifting her leg up on Sunday when she heard a loud pop in LT leg. Pt is paraplegic. Pt had an xray yesterday which showed a LT femur fracture. Pt also has bruising to posterior LT leg. Pt denies any pain.

## 2016-01-01 NOTE — Discharge Instructions (Signed)
Follow-up with orthopedic to evaluate the healing of the fracture. With your immobility, no splinting as needed.

## 2016-01-01 NOTE — ED Notes (Signed)
Report given to Margaretha Sheffield, RN  at [facility]

## 2016-01-01 NOTE — ED Notes (Signed)
Waiting on EMS to transport patient back home.

## 2016-01-01 NOTE — ED Notes (Signed)
Pt given dinner meal tray per MD order.

## 2016-01-01 NOTE — ED Provider Notes (Signed)
[facility] DEPT Provider Note   CSN: [id number] Arrival date & time: 01/01/16  1653     History   Chief Complaint Chief Complaint  Patient presents with  . Leg Injury    HPI ALISSANDRA BRUNETTI is a 70 y.o. female.  HPI Patient presents from the nursing home with a reported left femur fracture. She is a paraplegic and is basically confined to bed. She states she was lifting her left leg up yesterday and thinks she lifted too far and heard a crack in the leg. States that an x-ray was done and showed a fracture. She states today that it Doppler was done. States she was sent in here for the fracture. Patient does not feel below her umbilicus and does not have pain. There is some swelling in the leg. She is not on anticoagulation. No chest pain or trouble breathing. She has seen Dr. Aline Brochure in the past for bilateral broken knees.   Past Medical History:  Diagnosis Date  . Bipolar 1 disorder ([place])   . COPD with asthma ([place])   . Diabetes mellitus without complication ([place])   . GERD (gastroesophageal reflux disease)   . Hypertension   . Obesity   . 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 Pyelonephritis   . Thyroid disease     Patient Active Problem List   Diagnosis Date Noted  . Sepsis ([place]) 09/08/2014  . Chronic paraplegia ([place]) 09/08/2014  . Swelling of right lower extremity 07/21/2014  . Discitis of lumbar region   . Abscess in epidural space of lumbar spine   . Blood poisoning ([place])   . Osteomyelitis ([place])   . Discitis of thoracolumbar region   . Anxiety state   . Chronic pain syndrome   . Sepsis due to Candida species ([place])   . Spondylarthrosis   . Acute renal failure syndrome ([place])   . Other specified hypothyroidism   . Essential hypertension   . Paraplegia ([place])   . Enteritis due to Clostridium difficile 05/08/2014  . UTI (urinary tract infection) 05/08/2014  . Chronic indwelling Foley catheter 05/08/2014  . Hypotension   . Decubitus ulcer of sacral region,  unstageable ([place]) 03/28/2014  . Hypothyroid 03/28/2014  . Depression 03/28/2014  . ARF (acute renal failure) ([place]) 03/28/2014  . Acute respiratory failure ([place]) 03/27/2014  . Pyelonephritis 10/22/2013  . Leukocytosis 10/22/2013  . Hyponatremia 10/22/2013  . Encephalopathy acute 10/22/2013  . Morbid obesity ([place]) 10/22/2013  . Hypothyroidism 10/22/2013  . Hypertension   . Bipolar 1 disorder ([place])   . Thyroid disease   . Diabetes mellitus without complication ([place])   . GERD (gastroesophageal reflux disease)   . SHOULDER PAIN 11/19/2007  . HAND, ARTHRITIS, DEGEN./OSTEO 09/03/2007  . 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 CARPAL TUNNEL SYNDROME 05/16/2007    Past Surgical History:  Procedure Laterality Date  . ABDOMINAL HYSTERECTOMY    . BACK SURGERY      OB History    No data available       Home Medications    Prior to Admission medications   Medication Sig Start Date End Date Taking? Authorizing Provider  acetaminophen (TYLENOL) 325 MG tablet Take 650 mg by mouth 2 (two) times daily.   Yes Historical Provider, MD  albuterol (PROVENTIL HFA) 108 (90 BASE) MCG/ACT inhaler Inhale 2 puffs into the lungs every 8 (eight) hours as needed (Asthma).    Yes Historical Provider, MD  alendronate (FOSAMAX) 70 MG tablet Take 70 mg by mouth every Monday. Take with  a full glass of water on an empty stomach.    Yes Historical Provider, MD  allopurinol (ZYLOPRIM) 100 MG tablet Take 100 mg by mouth daily.  10/14/14  Yes Historical Provider, MD  alum & mag hydroxide-simeth (MAALOX/MYLANTA) 200-200-20 MG/5ML suspension Take 30 mLs by mouth every 4 (four) hours as needed for indigestion or heartburn.   Yes Historical Provider, MD  aspirin EC 81 MG tablet Take 81 mg by mouth daily.   Yes Historical Provider, MD  b complex vitamins tablet Take 1 tablet by mouth daily.   Yes Historical Provider, MD  baclofen (LIORESAL) 10 MG tablet Take 10 mg by mouth 2 (two) times daily.    Yes Historical Provider, MD    buPROPion (WELLBUTRIN XL) 150 MG 24 hr tablet Take 300 mg by mouth daily.    Yes Historical Provider, MD  calcium citrate-vitamin D 500-400 MG-UNIT Chew 1 tablet by mouth 2 (two) times daily.   Yes Historical Provider, MD  carvedilol (COREG) 12.5 MG tablet Take 12.5 mg by mouth 2 (two) times daily with a meal.   Yes Historical Provider, MD  cetirizine (ZYRTEC) 10 MG tablet Take 10 mg by mouth daily.   Yes Historical Provider, MD  cholecalciferol (VITAMIN D) 1000 UNITS tablet Take 1,000 Units by mouth daily.   Yes Historical Provider, MD  colchicine 0.6 MG tablet Take 0.6 mg by mouth 2 (two) times daily.   Yes Historical Provider, MD  Cranberry 475 MG CAPS Take 1 capsule by mouth daily.   Yes Historical Provider, MD  dextromethorphan-guaiFENesin (MUCINEX DM) 30-600 MG per 12 hr tablet Take 1 tablet by mouth 2 (two) times daily as needed for cough.   Yes Historical Provider, MD  diphenhydrAMINE (BENADRYL) 12.5 MG chewable tablet Chew 12.5 mg by mouth 4 (four) times daily as needed for allergies.   Yes Historical Provider, MD  ezetimibe (ZETIA) 10 MG tablet Take 10 mg by mouth daily.   Yes Historical Provider, MD  fluticasone (FLONASE) 50 MCG/ACT nasal spray Place 2 sprays into both nostrils daily.  09/03/14  Yes Historical Provider, MD  furosemide (LASIX) 40 MG tablet Take 40 mg by mouth daily.   Yes Historical Provider, MD  gabapentin (NEURONTIN) 300 MG capsule Take 300 mg by mouth 3 (three) times daily.   Yes Historical Provider, MD  guaifenesin (ROBITUSSIN) 100 MG/5ML syrup Take 100 mg by mouth every 4 (four) hours as needed for cough.   Yes Historical Provider, MD  ipratropium-albuterol (DUONEB) 0.5-2.5 (3) MG/3ML SOLN Take 3 mLs by nebulization every 6 (six) hours as needed (shortness of breath).    Yes Historical Provider, MD  iron polysaccharides (POLY-IRON 150) 150 MG capsule Take 150 mg by mouth daily.   Yes Historical Provider, MD  levothyroxine (SYNTHROID, LEVOTHROID) 200 MCG tablet Take 200  mcg by mouth daily. Takes with Synthroid (Levothyroxine) 25 mcg for a total of 297mcg daily at 6:00am   Yes Historical Provider, MD  LORazepam (ATIVAN) 0.5 MG tablet Take 0.5 tablets (0.25 mg total) by mouth every 6 (six) hours as needed for anxiety. Patient taking differently: Take 0.5 mg by mouth every 6 (six) hours as needed for anxiety.  05/20/14  Yes Reyne Dumas, MD  magnesium oxide (MAG-OX) 400 MG tablet Take 400 mg by mouth 2 (two) times daily.   Yes Historical Provider, MD  montelukast (SINGULAIR) 10 MG tablet Take 10 mg by mouth daily.   Yes Historical Provider, MD  Omega-3 Fatty Acids (SEA-OMEGA PO) Take 1 capsule by  mouth 2 (two) times daily.   Yes Historical Provider, MD  omeprazole (PRILOSEC) 40 MG capsule Take 20 mg by mouth 2 (two) times daily before a meal.    Yes Historical Provider, MD  oxyCODONE-acetaminophen (PERCOCET/ROXICET) 5-325 MG tablet Take 1 tablet by mouth 2 (two) times daily.   Yes Historical Provider, MD  promethazine (PHENERGAN) 12.5 MG tablet Take 12.5 mg by mouth every 6 (six) hours as needed for nausea or vomiting.   Yes Historical Provider, MD  pseudoephedrine (SUDAFED) 30 MG tablet Take 30 mg by mouth every 8 (eight) hours as needed for congestion.   Yes Historical Provider, MD  QUEtiapine (SEROQUEL) 50 MG tablet Take 50 mg by mouth at bedtime.   Yes Historical Provider, MD  rizatriptan (MAXALT) 10 MG tablet Take 1 tablet by mouth every 8 (eight) hours. 09/02/14  Yes Historical Provider, MD  sennosides-docusate sodium (SENOKOT-S) 8.6-50 MG tablet Take 1 tablet by mouth daily as needed for constipation.    Yes Historical Provider, MD  tiZANidine (ZANAFLEX) 2 MG tablet Take 2 mg by mouth every 6 (six) hours as needed for muscle spasms.   Yes Historical Provider, MD  topiramate (TOPAMAX) 50 MG tablet Take 50 mg by mouth 2 (two) times daily.   Yes Historical Provider, MD  traMADol (ULTRAM) 50 MG tablet Take 50 mg by mouth 2 (two) times daily.    Yes Historical Provider,  MD  vitamin C (ASCORBIC ACID) 500 MG tablet Take 500 mg by mouth 2 (two) times daily.   Yes Historical Provider, MD    Family History Family History  Problem Relation Age of Onset  . Adopted: Yes  . Heart disease Mother     Social History Social History  Substance Use Topics  . Smoking status: Former Research scientist (life sciences)  . Smokeless tobacco: Never Used  . Alcohol use No     Allergies   Codeine; Propranolol hcl; Rocephin [ceftriaxone]; and Vancomycin   Review of Systems Review of Systems  Constitutional: Negative for appetite change.  Respiratory: Negative for shortness of breath.   Cardiovascular: Positive for leg swelling. Negative for chest pain.  Gastrointestinal: Negative for abdominal pain.  Genitourinary: Negative for flank pain.  Musculoskeletal: Negative for back pain.  Skin: Negative for wound.  Neurological: Positive for weakness and numbness.  Psychiatric/Behavioral: Negative for confusion.     Physical Exam Updated Vital Signs BP 117/63   Pulse 79   Temp 98.5 F (36.9 C) (Oral)   Resp 20   Ht 5\' 7"  (1.702 m)   Wt 300 lb (136.1 kg)   SpO2 98%   BMI 46.99 kg/m   Physical Exam  Constitutional: She appears well-developed.  HENT:  Head: Atraumatic.  Eyes: EOM are normal.  Cardiovascular: Normal rate.   Pulmonary/Chest: Effort normal.  Abdominal: There is no tenderness.  Musculoskeletal: She exhibits edema.  Swelling with some ecchymosis to left thigh. May be some deformity proximally. Pulses intact in both feet. Patient is paraplegic.  Neurological: She is alert.  Skin: Skin is warm.     ED Treatments / Results  Labs (all labs ordered are listed, but only abnormal results are displayed) Labs Reviewed - No data to display  EKG  EKG Interpretation None       Radiology Dg Femur Min 2 Views Left  Result Date: 01/01/2016 CLINICAL DATA:  Femur injury began several days ago. Paraplegia. Patient heard pop at the time of injury. EXAM: LEFT FEMUR 2  VIEWS COMPARISON:  None. FINDINGS: Skeletal osteopenia. There  is a minimally displaced spiral fracture of the proximal femur, beginning at the lesser trochanter and extending inferiorly. Soft tissue swelling is present. Increased body habitus. Chronic deformity of the proximal tibia, old fracture, has healed. Tricompartmental degenerative change of the knee without definite effusion. Joint space narrowing at the hip. IMPRESSION: Minimally displaced spiral fracture of the proximal femur, with soft tissue swelling. Electronically Signed   By: Staci Righter M.D.   On: 01/01/2016 18:07    Procedures Procedures (including critical care time)  Medications Ordered in ED Medications - No data to display   Initial Impression / Assessment and Plan / ED Course  I have reviewed the triage vital signs and the nursing notes.  Pertinent labs & imaging results that were available during my care of the patient were reviewed by me and considered in my medical decision making (see chart for details).  Clinical Course    Patient with spontaneous proximal femur fracture. She is at baseline bedbound and paraplegic. No pain. No immobilization needed at this time. Will follow-up with or so. Reportedly a Doppler done today but I'm assuming showed no DVT. Discharge back to nursing home.  Final Clinical Impressions(s) / ED Diagnoses   Final diagnoses:  Closed nondisplaced spiral fracture of shaft of left femur, initial encounter [facility])    New Prescriptions New Prescriptions   No medications on file     Davonna Belling, MD 01/01/16 1847

## 2016-01-01 NOTE — ED Notes (Signed)
EDP at bedside  

## 2016-01-04 DIAGNOSIS — G8221 Paraplegia, complete: Secondary | ICD-10-CM | POA: Diagnosis not present

## 2016-01-04 DIAGNOSIS — R600 Localized edema: Secondary | ICD-10-CM | POA: Diagnosis not present

## 2016-02-08 DIAGNOSIS — E119 Type 2 diabetes mellitus without complications: Secondary | ICD-10-CM | POA: Diagnosis not present

## 2016-02-08 DIAGNOSIS — N311 Reflex neuropathic bladder, not elsewhere classified: Secondary | ICD-10-CM | POA: Diagnosis not present

## 2016-02-08 DIAGNOSIS — G8221 Paraplegia, complete: Secondary | ICD-10-CM | POA: Diagnosis not present

## 2016-02-08 DIAGNOSIS — K580 Irritable bowel syndrome with diarrhea: Secondary | ICD-10-CM | POA: Diagnosis not present

## 2016-02-08 DIAGNOSIS — I10 Essential (primary) hypertension: Secondary | ICD-10-CM | POA: Diagnosis not present

## 2016-02-08 DIAGNOSIS — F419 Anxiety disorder, unspecified: Secondary | ICD-10-CM | POA: Diagnosis not present

## 2016-02-22 DIAGNOSIS — K580 Irritable bowel syndrome with diarrhea: Secondary | ICD-10-CM | POA: Diagnosis not present

## 2016-02-22 DIAGNOSIS — R319 Hematuria, unspecified: Secondary | ICD-10-CM | POA: Diagnosis not present

## 2016-02-22 DIAGNOSIS — R6889 Other general symptoms and signs: Secondary | ICD-10-CM | POA: Diagnosis not present

## 2016-02-22 DIAGNOSIS — N390 Urinary tract infection, site not specified: Secondary | ICD-10-CM | POA: Diagnosis not present

## 2016-02-22 DIAGNOSIS — H04123 Dry eye syndrome of bilateral lacrimal glands: Secondary | ICD-10-CM | POA: Diagnosis not present

## 2016-03-24 DIAGNOSIS — M62830 Muscle spasm of back: Secondary | ICD-10-CM | POA: Diagnosis not present

## 2016-03-24 DIAGNOSIS — N3289 Other specified disorders of bladder: Secondary | ICD-10-CM | POA: Diagnosis not present

## 2016-03-24 DIAGNOSIS — T7840XA Allergy, unspecified, initial encounter: Secondary | ICD-10-CM | POA: Diagnosis not present

## 2016-03-24 DIAGNOSIS — M810 Age-related osteoporosis without current pathological fracture: Secondary | ICD-10-CM | POA: Diagnosis not present

## 2016-03-29 DIAGNOSIS — M62830 Muscle spasm of back: Secondary | ICD-10-CM | POA: Diagnosis not present

## 2016-03-29 DIAGNOSIS — N3289 Other specified disorders of bladder: Secondary | ICD-10-CM | POA: Diagnosis not present

## 2016-03-29 DIAGNOSIS — T7840XD Allergy, unspecified, subsequent encounter: Secondary | ICD-10-CM | POA: Diagnosis not present

## 2016-04-03 ENCOUNTER — Inpatient Hospital Stay (HOSPITAL_COMMUNITY)
Admission: EM | Admit: 2016-04-03 | Discharge: 2016-04-06 | DRG: 603 | Disposition: A | Discharge status: Skilled Nursing Facility | Payer: Medicare Other | Source: Skilled Nursing Facility | Attending: Internal Medicine | Admitting: Internal Medicine

## 2016-04-03 ENCOUNTER — Encounter (HOSPITAL_COMMUNITY): Payer: Self-pay | Admitting: *Deleted

## 2016-04-03 ENCOUNTER — Emergency Department (HOSPITAL_COMMUNITY): Payer: Medicare Other

## 2016-04-03 DIAGNOSIS — N12 Tubulo-interstitial nephritis, not specified as acute or chronic: Secondary | ICD-10-CM | POA: Diagnosis not present

## 2016-04-03 DIAGNOSIS — Z6841 Body Mass Index (BMI) 40.0 and over, adult: Secondary | ICD-10-CM

## 2016-04-03 DIAGNOSIS — R739 Hyperglycemia, unspecified: Secondary | ICD-10-CM | POA: Diagnosis not present

## 2016-04-03 DIAGNOSIS — L03116 Cellulitis of left lower limb: Secondary | ICD-10-CM | POA: Diagnosis not present

## 2016-04-03 DIAGNOSIS — E871 Hypo-osmolality and hyponatremia: Secondary | ICD-10-CM | POA: Diagnosis not present

## 2016-04-03 DIAGNOSIS — K219 Gastro-esophageal reflux disease without esophagitis: Secondary | ICD-10-CM | POA: Diagnosis present

## 2016-04-03 DIAGNOSIS — G8929 Other chronic pain: Secondary | ICD-10-CM | POA: Diagnosis not present

## 2016-04-03 DIAGNOSIS — E1165 Type 2 diabetes mellitus with hyperglycemia: Secondary | ICD-10-CM | POA: Diagnosis present

## 2016-04-03 DIAGNOSIS — F411 Generalized anxiety disorder: Secondary | ICD-10-CM | POA: Diagnosis not present

## 2016-04-03 DIAGNOSIS — G9340 Encephalopathy, unspecified: Secondary | ICD-10-CM | POA: Diagnosis not present

## 2016-04-03 DIAGNOSIS — Z79891 Long term (current) use of opiate analgesic: Secondary | ICD-10-CM

## 2016-04-03 DIAGNOSIS — M4645 Discitis, unspecified, thoracolumbar region: Secondary | ICD-10-CM | POA: Diagnosis not present

## 2016-04-03 DIAGNOSIS — Z888 Allergy status to other drugs, medicaments and biological substances status: Secondary | ICD-10-CM

## 2016-04-03 DIAGNOSIS — Z79899 Other long term (current) drug therapy: Secondary | ICD-10-CM

## 2016-04-03 DIAGNOSIS — E039 Hypothyroidism, unspecified: Secondary | ICD-10-CM | POA: Diagnosis present

## 2016-04-03 DIAGNOSIS — Z7951 Long term (current) use of inhaled steroids: Secondary | ICD-10-CM

## 2016-04-03 DIAGNOSIS — D72829 Elevated white blood cell count, unspecified: Secondary | ICD-10-CM | POA: Diagnosis present

## 2016-04-03 DIAGNOSIS — E785 Hyperlipidemia, unspecified: Secondary | ICD-10-CM | POA: Diagnosis present

## 2016-04-03 DIAGNOSIS — Z87891 Personal history of nicotine dependence: Secondary | ICD-10-CM | POA: Diagnosis not present

## 2016-04-03 DIAGNOSIS — T83511A Infection and inflammatory reaction due to indwelling urethral catheter, initial encounter: Secondary | ICD-10-CM | POA: Diagnosis not present

## 2016-04-03 DIAGNOSIS — Z90710 Acquired absence of both cervix and uterus: Secondary | ICD-10-CM | POA: Diagnosis not present

## 2016-04-03 DIAGNOSIS — G8220 Paraplegia, unspecified: Secondary | ICD-10-CM | POA: Diagnosis present

## 2016-04-03 DIAGNOSIS — I10 Essential (primary) hypertension: Secondary | ICD-10-CM | POA: Diagnosis present

## 2016-04-03 DIAGNOSIS — Y846 Urinary catheterization as the cause of abnormal reaction of the patient, or of later complication, without mention of misadventure at the time of the procedure: Secondary | ICD-10-CM | POA: Diagnosis present

## 2016-04-03 DIAGNOSIS — Z7401 Bed confinement status: Secondary | ICD-10-CM | POA: Diagnosis not present

## 2016-04-03 DIAGNOSIS — E876 Hypokalemia: Secondary | ICD-10-CM | POA: Diagnosis not present

## 2016-04-03 DIAGNOSIS — F339 Major depressive disorder, recurrent, unspecified: Secondary | ICD-10-CM | POA: Diagnosis not present

## 2016-04-03 DIAGNOSIS — E119 Type 2 diabetes mellitus without complications: Secondary | ICD-10-CM | POA: Diagnosis not present

## 2016-04-03 DIAGNOSIS — N319 Neuromuscular dysfunction of bladder, unspecified: Secondary | ICD-10-CM | POA: Diagnosis not present

## 2016-04-03 DIAGNOSIS — M869 Osteomyelitis, unspecified: Secondary | ICD-10-CM | POA: Diagnosis not present

## 2016-04-03 DIAGNOSIS — N3289 Other specified disorders of bladder: Secondary | ICD-10-CM | POA: Diagnosis not present

## 2016-04-03 DIAGNOSIS — Z7982 Long term (current) use of aspirin: Secondary | ICD-10-CM

## 2016-04-03 DIAGNOSIS — I6789 Other cerebrovascular disease: Secondary | ICD-10-CM | POA: Diagnosis not present

## 2016-04-03 DIAGNOSIS — J449 Chronic obstructive pulmonary disease, unspecified: Secondary | ICD-10-CM | POA: Diagnosis present

## 2016-04-03 DIAGNOSIS — N390 Urinary tract infection, site not specified: Secondary | ICD-10-CM | POA: Diagnosis present

## 2016-04-03 DIAGNOSIS — J069 Acute upper respiratory infection, unspecified: Secondary | ICD-10-CM | POA: Diagnosis present

## 2016-04-03 DIAGNOSIS — E139 Other specified diabetes mellitus without complications: Secondary | ICD-10-CM | POA: Diagnosis not present

## 2016-04-03 DIAGNOSIS — Z8249 Family history of ischemic heart disease and other diseases of the circulatory system: Secondary | ICD-10-CM | POA: Diagnosis not present

## 2016-04-03 DIAGNOSIS — R05 Cough: Secondary | ICD-10-CM | POA: Diagnosis not present

## 2016-04-03 DIAGNOSIS — L0390 Cellulitis, unspecified: Secondary | ICD-10-CM | POA: Diagnosis present

## 2016-04-03 DIAGNOSIS — M4646 Discitis, unspecified, lumbar region: Secondary | ICD-10-CM | POA: Diagnosis not present

## 2016-04-03 DIAGNOSIS — S72345A Nondisplaced spiral fracture of shaft of left femur, initial encounter for closed fracture: Secondary | ICD-10-CM | POA: Diagnosis not present

## 2016-04-03 DIAGNOSIS — G839 Paralytic syndrome, unspecified: Secondary | ICD-10-CM | POA: Diagnosis present

## 2016-04-03 DIAGNOSIS — N10 Acute tubulo-interstitial nephritis: Secondary | ICD-10-CM | POA: Diagnosis not present

## 2016-04-03 DIAGNOSIS — Z881 Allergy status to other antibiotic agents status: Secondary | ICD-10-CM | POA: Diagnosis not present

## 2016-04-03 DIAGNOSIS — F319 Bipolar disorder, unspecified: Secondary | ICD-10-CM | POA: Diagnosis present

## 2016-04-03 DIAGNOSIS — R279 Unspecified lack of coordination: Secondary | ICD-10-CM | POA: Diagnosis not present

## 2016-04-03 DIAGNOSIS — F419 Anxiety disorder, unspecified: Secondary | ICD-10-CM | POA: Diagnosis not present

## 2016-04-03 DIAGNOSIS — M4300 Spondylolysis, site unspecified: Secondary | ICD-10-CM | POA: Diagnosis not present

## 2016-04-03 LAB — CBC WITH DIFFERENTIAL/PLATELET
BASOS ABS: 0 10*3/uL (ref 0.0–0.1)
Basophils Relative: 0 %
EOS PCT: 1 %
Eosinophils Absolute: 0.1 10*3/uL (ref 0.0–0.7)
HCT: 42.2 % (ref 36.0–46.0)
Hemoglobin: 12.9 g/dL (ref 12.0–15.0)
LYMPHS ABS: 2.5 10*3/uL (ref 0.7–4.0)
LYMPHS PCT: 20 %
MCH: 27.5 pg (ref 26.0–34.0)
MCHC: 30.6 g/dL (ref 30.0–36.0)
MCV: 90 fL (ref 78.0–100.0)
MONO ABS: 0.6 10*3/uL (ref 0.1–1.0)
MONOS PCT: 5 %
Neutro Abs: 9.2 10*3/uL — ABNORMAL HIGH (ref 1.7–7.7)
Neutrophils Relative %: 74 %
PLATELETS: 187 10*3/uL (ref 150–400)
RBC: 4.69 MIL/uL (ref 3.87–5.11)
RDW: 15.9 % — AB (ref 11.5–15.5)
WBC: 12.5 10*3/uL — ABNORMAL HIGH (ref 4.0–10.5)

## 2016-04-03 LAB — URINALYSIS, ROUTINE W REFLEX MICROSCOPIC
Bilirubin Urine: NEGATIVE
Glucose, UA: NEGATIVE mg/dL
KETONES UR: NEGATIVE mg/dL
Nitrite: POSITIVE — AB
PROTEIN: 100 mg/dL — AB
Specific Gravity, Urine: 1.011 (ref 1.005–1.030)
pH: 8 (ref 5.0–8.0)

## 2016-04-03 LAB — COMPREHENSIVE METABOLIC PANEL
ALK PHOS: 96 U/L (ref 38–126)
ALT: 31 U/L (ref 14–54)
ANION GAP: 9 (ref 5–15)
AST: 36 U/L (ref 15–41)
Albumin: 3.6 g/dL (ref 3.5–5.0)
BILIRUBIN TOTAL: 0.6 mg/dL (ref 0.3–1.2)
BUN: 20 mg/dL (ref 6–20)
CALCIUM: 8.8 mg/dL — AB (ref 8.9–10.3)
CO2: 27 mmol/L (ref 22–32)
Chloride: 100 mmol/L — ABNORMAL LOW (ref 101–111)
Creatinine, Ser: 0.73 mg/dL (ref 0.44–1.00)
GFR calc Af Amer: >60 mL/min (ref >60)
GFR calc non Af Amer: >60 mL/min (ref >60)
Glucose, Bld: 208 mg/dL — ABNORMAL HIGH (ref 65–99)
POTASSIUM: 3.4 mmol/L — AB (ref 3.5–5.1)
Sodium: 136 mmol/L (ref 135–145)
TOTAL PROTEIN: 6.9 g/dL (ref 6.5–8.1)

## 2016-04-03 LAB — LACTIC ACID, PLASMA: Lactic Acid, Venous: 2.3 mmol/L (ref 0.5–1.9)

## 2016-04-03 NOTE — ED Triage Notes (Signed)
Pt arrived by EMS from [place]. Pt had chills last night. [facility] states elevated WBC, pt has been started on Cipro. Pt came to facility w/ indwelling catheter.

## 2016-04-03 NOTE — ED Notes (Signed)
Pt sent from [facility] by EMS for abnormal labs. Pt states she has had some chills last night, was started on oral antibiotic yesterday. Pt has congested cough noted, urinary catheter in place that was change at facility yesterday, urine has a strong odor. Pt left thigh is noted red & warm to the touch.

## 2016-04-03 NOTE — ED Notes (Signed)
CRITICAL VALUE ALERT  Critical value received:  Lactic Acid 2.3  Date of notification:  04/03/16  Time of notification:  2213  Critical value read back:Yes.    Nurse who received alert:  Derek Mound, RN  MD notified (1st page):  [place]  Time of first page:  2214  MD notified (2nd page):  Time of second page:  Responding MD:  Roderic Palau  Time MD responded:  2214

## 2016-04-03 NOTE — ED Provider Notes (Signed)
[facility] DEPT Provider Note   CSN: [id number] Arrival date & time: 04/03/16  2103  By signing my name below, I, Reola Mosher, attest that this documentation has been prepared under the direction and in the presence of Milton Ferguson, MD. Electronically Signed: Reola Mosher, ED Scribe. 04/03/16. 9:46 PM.  History   Chief Complaint Chief Complaint  Patient presents with  . Abnormal Lab   The history is provided by the EMS personnel and the nursing home.  Abnormal Lab  Time since result:  N/a Patient referred by:  Nursing home Resulting agency:  Internal Result type: hematology   Hematology:    Leukocytes:  High   Other abnormal hematology result:  N/a   HPI Comments: [name] is a 71 y.o. female BIB EMS from [place], with a PMHx of paraplegic spinal paralysis w/ loss of sensation from the waist downward, DM, and chronic indwelling foley catheter placement, who presents to the Emergency Department due to an abnormal lab result. Per nursing facility, pt had been experiencing chills, fever (Tmax 102.8) since last night, and worsening chest congestion over the past several days. They performed a CBC internally which revealed Leukocytosis at 22 earlier in the day, and subsequently referred her into the ED for evaluation. She was placed on a course of Ciprofloxacin today for this. Pt also reports that several weeks ago she was dx'd with a UTI, and at that time she was placed on a seven day course of antibiotics which she finished compliantly. She also states that she recently fractured her left femur (not amenable to surgery), and has had increased skin redness over the site of her fracture approximately 2 days ago. No other associated symptoms or complaints at this time.   Past Medical History:  Diagnosis Date  . Bipolar 1 disorder ([place])   . COPD with asthma ([place])   . Diabetes mellitus without complication ([place])   . GERD (gastroesophageal reflux disease)   .  Hypertension   . Obesity   . 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 Pyelonephritis   . Thyroid disease    Patient Active Problem List   Diagnosis Date Noted  . Sepsis ([place]) 09/08/2014  . Chronic paraplegia ([place]) 09/08/2014  . Swelling of right lower extremity 07/21/2014  . Discitis of lumbar region   . Abscess in epidural space of lumbar spine   . Blood poisoning ([place])   . Osteomyelitis ([place])   . Discitis of thoracolumbar region   . Anxiety state   . Chronic pain syndrome   . Sepsis due to Candida species ([place])   . Spondylarthrosis   . Acute renal failure syndrome ([place])   . Other specified hypothyroidism   . Essential hypertension   . Paraplegia ([place])   . Enteritis due to Clostridium difficile 05/08/2014  . UTI (urinary tract infection) 05/08/2014  . Chronic indwelling Foley catheter 05/08/2014  . Hypotension   . Decubitus ulcer of sacral region, unstageable ([place]) 03/28/2014  . Hypothyroid 03/28/2014  . Depression 03/28/2014  . ARF (acute renal failure) ([place]) 03/28/2014  . Acute respiratory failure ([place]) 03/27/2014  . Pyelonephritis 10/22/2013  . Leukocytosis 10/22/2013  . Hyponatremia 10/22/2013  . Encephalopathy acute 10/22/2013  . Morbid obesity ([place]) 10/22/2013  . Hypothyroidism 10/22/2013  . Hypertension   . Bipolar 1 disorder ([place])   . Thyroid disease   . Diabetes mellitus without complication ([place])   . GERD (gastroesophageal reflux disease)   . SHOULDER PAIN 11/19/2007  . HAND, ARTHRITIS,  DEGEN./OSTEO 09/03/2007  . 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 CARPAL TUNNEL SYNDROME 05/16/2007   Past Surgical History:  Procedure Laterality Date  . ABDOMINAL HYSTERECTOMY    . BACK SURGERY     OB History    No data available     Home Medications    Prior to Admission medications   Medication Sig Start Date End Date Taking? Authorizing Provider  acetaminophen (TYLENOL) 325 MG tablet Take 650 mg by mouth 2 (two) times daily.    Historical Provider, MD  albuterol  (PROVENTIL HFA) 108 (90 BASE) MCG/ACT inhaler Inhale 2 puffs into the lungs every 8 (eight) hours as needed (Asthma).     Historical Provider, MD  alendronate (FOSAMAX) 70 MG tablet Take 70 mg by mouth every Monday. Take with a full glass of water on an empty stomach.     Historical Provider, MD  allopurinol (ZYLOPRIM) 100 MG tablet Take 100 mg by mouth daily.  10/14/14   Historical Provider, MD  alum & mag hydroxide-simeth (MAALOX/MYLANTA) 200-200-20 MG/5ML suspension Take 30 mLs by mouth every 4 (four) hours as needed for indigestion or heartburn.    Historical Provider, MD  aspirin EC 81 MG tablet Take 81 mg by mouth daily.    Historical Provider, MD  b complex vitamins tablet Take 1 tablet by mouth daily.    Historical Provider, MD  baclofen (LIORESAL) 10 MG tablet Take 10 mg by mouth 2 (two) times daily.     Historical Provider, MD  buPROPion (WELLBUTRIN XL) 150 MG 24 hr tablet Take 300 mg by mouth daily.     Historical Provider, MD  calcium citrate-vitamin D 500-400 MG-UNIT Chew 1 tablet by mouth 2 (two) times daily.    Historical Provider, MD  carvedilol (COREG) 12.5 MG tablet Take 12.5 mg by mouth 2 (two) times daily with a meal.    Historical Provider, MD  cetirizine (ZYRTEC) 10 MG tablet Take 10 mg by mouth daily.    Historical Provider, MD  cholecalciferol (VITAMIN D) 1000 UNITS tablet Take 1,000 Units by mouth daily.    Historical Provider, MD  colchicine 0.6 MG tablet Take 0.6 mg by mouth 2 (two) times daily.    Historical Provider, MD  Cranberry 475 MG CAPS Take 1 capsule by mouth daily.    Historical Provider, MD  dextromethorphan-guaiFENesin (MUCINEX DM) 30-600 MG per 12 hr tablet Take 1 tablet by mouth 2 (two) times daily as needed for cough.    Historical Provider, MD  diphenhydrAMINE (BENADRYL) 12.5 MG chewable tablet Chew 12.5 mg by mouth 4 (four) times daily as needed for allergies.    Historical Provider, MD  ezetimibe (ZETIA) 10 MG tablet Take 10 mg by mouth daily.    Historical  Provider, MD  fluticasone (FLONASE) 50 MCG/ACT nasal spray Place 2 sprays into both nostrils daily.  09/03/14   Historical Provider, MD  furosemide (LASIX) 40 MG tablet Take 40 mg by mouth daily.    Historical Provider, MD  gabapentin (NEURONTIN) 300 MG capsule Take 300 mg by mouth 3 (three) times daily.    Historical Provider, MD  guaifenesin (ROBITUSSIN) 100 MG/5ML syrup Take 100 mg by mouth every 4 (four) hours as needed for cough.    Historical Provider, MD  ipratropium-albuterol (DUONEB) 0.5-2.5 (3) MG/3ML SOLN Take 3 mLs by nebulization every 6 (six) hours as needed (shortness of breath).     Historical Provider, MD  iron polysaccharides (POLY-IRON 150) 150 MG capsule Take 150 mg by mouth daily.  Historical Provider, MD  levothyroxine (SYNTHROID, LEVOTHROID) 200 MCG tablet Take 200 mcg by mouth daily. Takes with Synthroid (Levothyroxine) 25 mcg for a total of 227mcg daily at 6:00am    Historical Provider, MD  LORazepam (ATIVAN) 0.5 MG tablet Take 0.5 tablets (0.25 mg total) by mouth every 6 (six) hours as needed for anxiety. Patient taking differently: Take 0.5 mg by mouth every 6 (six) hours as needed for anxiety.  05/20/14   Reyne Dumas, MD  magnesium oxide (MAG-OX) 400 MG tablet Take 400 mg by mouth 2 (two) times daily.    Historical Provider, MD  montelukast (SINGULAIR) 10 MG tablet Take 10 mg by mouth daily.    Historical Provider, MD  Omega-3 Fatty Acids (SEA-OMEGA PO) Take 1 capsule by mouth 2 (two) times daily.    Historical Provider, MD  omeprazole (PRILOSEC) 40 MG capsule Take 20 mg by mouth 2 (two) times daily before a meal.     Historical Provider, MD  oxyCODONE-acetaminophen (PERCOCET/ROXICET) 5-325 MG tablet Take 1 tablet by mouth 2 (two) times daily.    Historical Provider, MD  promethazine (PHENERGAN) 12.5 MG tablet Take 12.5 mg by mouth every 6 (six) hours as needed for nausea or vomiting.    Historical Provider, MD  pseudoephedrine (SUDAFED) 30 MG tablet Take 30 mg by mouth  every 8 (eight) hours as needed for congestion.    Historical Provider, MD  QUEtiapine (SEROQUEL) 50 MG tablet Take 50 mg by mouth at bedtime.    Historical Provider, MD  rizatriptan (MAXALT) 10 MG tablet Take 1 tablet by mouth every 8 (eight) hours. 09/02/14   Historical Provider, MD  sennosides-docusate sodium (SENOKOT-S) 8.6-50 MG tablet Take 1 tablet by mouth daily as needed for constipation.     Historical Provider, MD  tiZANidine (ZANAFLEX) 2 MG tablet Take 2 mg by mouth every 6 (six) hours as needed for muscle spasms.    Historical Provider, MD  topiramate (TOPAMAX) 50 MG tablet Take 50 mg by mouth 2 (two) times daily.    Historical Provider, MD  traMADol (ULTRAM) 50 MG tablet Take 50 mg by mouth 2 (two) times daily.     Historical Provider, MD  vitamin C (ASCORBIC ACID) 500 MG tablet Take 500 mg by mouth 2 (two) times daily.    Historical Provider, MD   Family History Family History  Problem Relation Age of Onset  . Adopted: Yes  . Heart disease Mother    Social History Social History  Substance Use Topics  . Smoking status: Former Research scientist (life sciences)  . Smokeless tobacco: Never Used  . Alcohol use No   Allergies   Codeine; Propranolol hcl; Rocephin [ceftriaxone]; and Vancomycin  Review of Systems Review of Systems  Constitutional: Positive for chills and fever (Tmax 102.8). Negative for appetite change and fatigue.  HENT: Positive for congestion. Negative for ear discharge and sinus pressure.   Eyes: Negative for discharge.  Respiratory: Negative for cough.   Cardiovascular: Negative for chest pain.  Gastrointestinal: Negative for abdominal pain and diarrhea.  Genitourinary: Negative for frequency and hematuria.  Musculoskeletal: Negative for back pain and myalgias.  Skin: Positive for color change. Negative for rash.  Neurological: Negative for seizures and headaches.  Psychiatric/Behavioral: Negative for hallucinations.   Physical Exam Updated Vital Signs Ht 5\' 7"  (1.702 m)   Wt  280 lb (127 kg)   BMI 43.85 kg/m   Physical Exam  Constitutional: She is oriented to person, place, and time. She appears well-developed.  HENT:  Head:  Normocephalic.  Eyes: Conjunctivae and EOM are normal. No scleral icterus.  Neck: Neck supple. No thyromegaly present.  Cardiovascular: Normal rate and regular rhythm.  Exam reveals no gallop and no friction rub.   No murmur heard. Pulmonary/Chest: No stridor. She has wheezes. She has no rales. She exhibits no tenderness.  Minimally wheezing bilaterally.  Abdominal: She exhibits no distension. There is no tenderness. There is no rebound.  Musculoskeletal: Normal range of motion. She exhibits no edema.  Lymphadenopathy:    She has no cervical adenopathy.  Neurological: She is alert and oriented to person, place, and time. Coordination normal.  Paralyzed from the waist down.   Skin: No rash noted. No erythema.  Left leg with large cellulitic rash to her thigh.   Psychiatric: She has a normal mood and affect. Her behavior is normal.   ED Treatments / Results  DIAGNOSTIC STUDIES: Oxygen Saturation is 97% on RA, normal by my interpretation.   COORDINATION OF CARE: 9:44 PM-Discussed next steps with pt. Pt verbalized understanding and is agreeable with the plan.   Labs (all labs ordered are listed, but only abnormal results are displayed) Labs Reviewed - No data to display  EKG  EKG Interpretation None      Radiology No results found.  Procedures Procedures   Medications Ordered in ED Medications - No data to display  Initial Impression / Assessment and Plan / ED Course  I have reviewed the triage vital signs and the nursing notes.  Pertinent labs & imaging results that were available during my care of the patient were reviewed by me and considered in my medical decision making (see chart for details).  Clinical Course    Patient with cellulitis of left thigh. She'll be admitted for IV antibiotics  Final Clinical  Impressions(s) / ED Diagnoses   Final diagnoses:  None   New Prescriptions New Prescriptions   No medications on file   ,j     Milton Ferguson, MD 04/03/16 2334

## 2016-04-03 NOTE — ED Notes (Signed)
Pt provided some water at this time

## 2016-04-03 NOTE — H&P (Signed)
History and Physical    [name] [id number] DOB: 11-May-1945 DOA: 04/03/2016  PCP: [name], MD  Patient coming from: SNF.    Chief Complaint:  Redness over the left lateral leg.   HPI: [name] is an 71 y.o. female with hx of lower extremity paralysis, COPD, Bipolar disorder, chronic foley, HTN, HLD, presented to the ER as she has cellulitis of the left thigh for a few days, along with a non productive coughs.   She was found to also has a UTI, and due to her various antibiotic allergies, she was started on Cipro.  The area of redness did not recede, thus she was sent into the ER.  She denied pain, fever, chills, SOB, CP, or any other symptomology.  She said her foley often becomes cloudy, and her urethra has been "blocked" frequently causing some leakage.    ED Course:  See above.  Rewiew of Systems:  Constitutional: Negative for malaise, fever and chills. No significant weight loss or weight gain Eyes: Negative for eye pain, redness and discharge, diplopia, visual changes, or flashes of light. ENMT: Negative for ear pain, hoarseness, nasal congestion, sinus pressure and sore throat. No headaches; tinnitus, drooling, or problem swallowing. Cardiovascular: Negative for chest pain, palpitations, diaphoresis, dyspnea and peripheral edema. ; No orthopnea, PND Respiratory: Negative for cough, hemoptysis, wheezing and stridor. No pleuritic chestpain. Gastrointestinal: Negative for diarrhea, constipation,  melena, blood in stool, hematemesis, jaundice and rectal bleeding.    Genitourinary: Negative for frequency, dysuria, incontinence,flank pain and hematuria; Musculoskeletal: Negative for back pain and neck pain. Negative for swelling and trauma.;  Skin: . Negative for pruritus, rash, abrasions, bruising and skin lesion.; ulcerations Neuro: Negative for headache, lightheadedness and neck stiffness. Negative for weakness, altered level of consciousness , altered mental status,  extremity weakness, burning feet, involuntary movement, seizure and syncope.  Psych: negative for anxiety, depression, insomnia, tearfulness, panic attacks, hallucinations, paranoia, suicidal or homicidal ideation   Past Medical History:  Diagnosis Date  . Bipolar 1 disorder ([place])   . COPD with asthma ([place])   . Diabetes mellitus without complication ([place])   . GERD (gastroesophageal reflux disease)   . Hypertension   . Obesity   . 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 Pyelonephritis   . Thyroid disease     Rewiew of Systems:  Constitutional: Negative for malaise, fever and chills. No significant weight loss or weight gain Eyes: Negative for eye pain, redness and discharge, diplopia, visual changes, or flashes of light. ENMT: Negative for ear pain, hoarseness, nasal congestion, sinus pressure and sore throat. No headaches; tinnitus, drooling, or problem swallowing. Cardiovascular: Negative for chest pain, palpitations, diaphoresis, dyspnea and peripheral edema. ; No orthopnea, PND Respiratory: Negative for cough, hemoptysis, wheezing and stridor. No pleuritic chestpain. Gastrointestinal: Negative for nausea, vomiting, diarrhea, constipation, abdominal pain, melena, blood in stool, hematemesis, jaundice and rectal bleeding.    Genitourinary: Negative for frequency, dysuria, incontinence,flank pain and hematuria; Musculoskeletal: Negative for back pain and neck pain. Negative for swelling and trauma.;  Skin: . Negative for pruritus, abrasions, bruising and skin lesion.; ulcerations Neuro: Negative for headache, lightheadedness and neck stiffness. Negative for weakness, altered level of consciousness , altered mental status, extremity weakness, burning feet, involuntary movement, seizure and syncope.  Psych: negative for anxiety, depression, insomnia, tearfulness, panic attacks, hallucinations, paranoia, suicidal or homicidal ideation   Past Surgical History:  Procedure Laterality Date  .  ABDOMINAL HYSTERECTOMY    . BACK SURGERY  reports that she has quit smoking. She has never used smokeless tobacco. She reports that she does not drink alcohol or use drugs.  Allergies  Allergen Reactions  . Invanz [Ertapenem] Other (See Comments)    Patient states allergy but is not certain   . Propranolol Hcl   . Rocephin [Ceftriaxone] Hives  . Vancomycin Hives    Family History  Problem Relation Age of Onset  . Adopted: Yes  . Heart disease Mother      Prior to Admission medications   Medication Sig Start Date End Date Taking? Authorizing Provider  acetaminophen (TYLENOL) 325 MG tablet Take 325 mg by mouth 2 (two) times daily. *May take one every 6 hours as needed for pain   Yes Historical Provider, MD  albuterol (PROVENTIL HFA) 108 (90 BASE) MCG/ACT inhaler Inhale 2 puffs into the lungs every 8 (eight) hours as needed (Asthma).    Yes Historical Provider, MD  alendronate (FOSAMAX) 70 MG tablet Take 70 mg by mouth every Monday. Take with a full glass of water on an empty stomach.    Yes Historical Provider, MD  allopurinol (ZYLOPRIM) 100 MG tablet Take 100 mg by mouth daily.  10/14/14  Yes Historical Provider, MD  alum & mag hydroxide-simeth (MAALOX/MYLANTA) 200-200-20 MG/5ML suspension Take 20 mLs by mouth every 4 (four) hours as needed for indigestion or heartburn.    Yes Historical Provider, MD  aspirin EC 81 MG tablet Take 81 mg by mouth daily.   Yes Historical Provider, MD  b complex vitamins tablet Take 1 tablet by mouth daily.   Yes Historical Provider, MD  buPROPion (WELLBUTRIN XL) 300 MG 24 hr tablet Take 300 mg by mouth daily.    Yes Historical Provider, MD  calcium carbonate (TUMS - DOSED IN MG ELEMENTAL CALCIUM) 500 MG chewable tablet Chew 2 tablets by mouth every 6 (six) hours as needed for indigestion or heartburn.   Yes Historical Provider, MD  calcium citrate-vitamin D 500-400 MG-UNIT Chew 1 tablet by mouth 2 (two) times daily.   Yes Historical Provider, MD    carvedilol (COREG) 12.5 MG tablet Take 12.5 mg by mouth 2 (two) times daily with a meal.   Yes Historical Provider, MD  cetirizine (ZYRTEC) 10 MG tablet Take 10 mg by mouth daily.   Yes Historical Provider, MD  cholecalciferol (VITAMIN D) 1000 UNITS tablet Take 1,000 Units by mouth daily.   Yes Historical Provider, MD  ciprofloxacin (CIPRO) 500 MG tablet Take 500 mg by mouth 2 (two) times daily. For UTI. 5 day course starting on 04/02/2016   Yes Historical Provider, MD  colchicine 0.6 MG tablet Take 0.6 mg by mouth 2 (two) times daily.   Yes Historical Provider, MD  Cranberry 475 MG CAPS Take 1 capsule by mouth daily.   Yes Historical Provider, MD  dextromethorphan-guaiFENesin (MUCINEX DM) 30-600 MG per 12 hr tablet Take 1 tablet by mouth every 12 (twelve) hours as needed for cough.    Yes Historical Provider, MD  diazepam (VALIUM) 5 MG tablet Take 5 mg by mouth every 6 (six) hours as needed for muscle spasms (itching/moderate pain).   Yes Historical Provider, MD  diphenhydrAMINE (BENADRYL) 12.5 MG chewable tablet Chew 12.5 mg by mouth 4 (four) times daily as needed for allergies.   Yes Historical Provider, MD  docusate sodium (COLACE) 100 MG capsule Take 100 mg by mouth every 12 (twelve) hours as needed for mild constipation.   Yes Historical Provider, MD  ezetimibe (ZETIA) 10 MG tablet  Take 10 mg by mouth daily.   Yes Historical Provider, MD  fluticasone (FLONASE) 50 MCG/ACT nasal spray Place 2 sprays into both nostrils daily.  09/03/14  Yes Historical Provider, MD  furosemide (LASIX) 40 MG tablet Take 40 mg by mouth daily.   Yes Historical Provider, MD  gabapentin (NEURONTIN) 300 MG capsule Take 300 mg by mouth 3 (three) times daily.   Yes Historical Provider, MD  guaifenesin (ROBITUSSIN) 100 MG/5ML syrup Take 100 mg by mouth every 4 (four) hours as needed for cough.   Yes Historical Provider, MD  ipratropium-albuterol (DUONEB) 0.5-2.5 (3) MG/3ML SOLN Take 3 mLs by nebulization every 6 (six) hours  as needed (shortness of breath).    Yes Historical Provider, MD  iron polysaccharides (POLY-IRON 150) 150 MG capsule Take 150 mg by mouth daily.   Yes Historical Provider, MD  Javier Docker Oil 300 MG CAPS Take 1 capsule by mouth 2 (two) times daily.   Yes Historical Provider, MD  levothyroxine (SYNTHROID, LEVOTHROID) 200 MCG tablet Take 200 mcg by mouth daily. Takes with Synthroid (Levothyroxine) 25 mcg for a total of 260mcg daily at 6:00am   Yes Historical Provider, MD  magnesium oxide (MAG-OX) 400 MG tablet Take 400 mg by mouth 2 (two) times daily.   Yes Historical Provider, MD  montelukast (SINGULAIR) 10 MG tablet Take 10 mg by mouth daily.   Yes Historical Provider, MD  nystatin ointment (MYCOSTATIN) Apply 1 application topically every 8 (eight) hours as needed. Applied to the back of legs   Yes Historical Provider, MD  omeprazole (PRILOSEC) 40 MG capsule Take 40 mg by mouth 2 (two) times daily before a meal.    Yes Historical Provider, MD  oxyCODONE-acetaminophen (PERCOCET/ROXICET) 5-325 MG tablet Take 1 tablet by mouth 2 (two) times daily.   Yes Historical Provider, MD  promethazine (PHENERGAN) 12.5 MG tablet Take 12.5 mg by mouth every 6 (six) hours as needed for nausea or vomiting.   Yes Historical Provider, MD  pseudoephedrine (SUDAFED) 30 MG tablet Take 30 mg by mouth every 8 (eight) hours as needed for congestion.   Yes Historical Provider, MD  QUEtiapine (SEROQUEL) 50 MG tablet Take 50 mg by mouth at bedtime.   Yes Historical Provider, MD  rizatriptan (MAXALT) 10 MG tablet Take 1 tablet by mouth every 8 (eight) hours as needed for migraine.  09/02/14  Yes Historical Provider, MD  sennosides-docusate sodium (SENOKOT-S) 8.6-50 MG tablet Take 1 tablet by mouth daily as needed for constipation.    Yes Historical Provider, MD  simethicone (MYLICON) 80 MG chewable tablet Chew 80 mg by mouth every 8 (eight) hours as needed for flatulence.   Yes Historical Provider, MD  tiZANidine (ZANAFLEX) 4 MG tablet  Take 4 mg by mouth every 6 (six) hours as needed for muscle spasms.    Yes Historical Provider, MD  topiramate (TOPAMAX) 50 MG tablet Take 50 mg by mouth 2 (two) times daily.   Yes Historical Provider, MD  traMADol (ULTRAM) 50 MG tablet Take 50 mg by mouth 2 (two) times daily.    Yes Historical Provider, MD  vitamin C (ASCORBIC ACID) 500 MG tablet Take 500 mg by mouth 2 (two) times daily.   Yes Historical Provider, MD    Physical Exam: Vitals:   04/03/16 2107 04/03/16 2111 04/03/16 2130 04/03/16 2200  BP:  136/71 120/67 119/72  Pulse:  88 91 90  Resp:  20    Temp:  98.8 F (37.1 C)    TempSrc:  Oral  SpO2:  97% 98% 93%  Weight: 127 kg (280 lb)     Height: 5\' 7"  (1.702 m)       Constitutional: NAD, calm, comfortable Vitals:   04/03/16 2107 04/03/16 2111 04/03/16 2130 04/03/16 2200  BP:  136/71 120/67 119/72  Pulse:  88 91 90  Resp:  20    Temp:  98.8 F (37.1 C)    TempSrc:  Oral    SpO2:  97% 98% 93%  Weight: 127 kg (280 lb)     Height: 5\' 7"  (1.702 m)      Eyes: PERRL, lids and conjunctivae normal ENMT: Mucous membranes are moist. Posterior pharynx clear of any exudate or lesions.Normal dentition.  Neck: normal, supple, no masses, no thyromegaly Respiratory: clear to auscultation bilaterally, no wheezing, no crackles. Normal respiratory effort. No accessory muscle use.  Cardiovascular: Regular rate and rhythm, no murmurs / rubs / gallops. No extremity edema. 2+ pedal pulses. No carotid bruits.  Abdomen: no tenderness, no masses palpated. No hepatosplenomegaly. Bowel sounds positive.  Musculoskeletal: no clubbing / cyanosis. No joint deformity upper and lower extremities. Good ROM, no contractures. Normal muscle tone.  Skin: no rashes, lesions, ulcers. No induration.  Area of cellulitis was demarcated.  No fluctuant.  Neurologic: CN 2-12 grossly intact. Sensation intact, DTR normal. Strength 5/5 in all 4.  Psychiatric: Normal judgment and insight. Alert and oriented x 3.  Normal mood.     Labs on Admission: I have personally reviewed following labs and imaging studies  CBC:  Recent Labs Lab 04/03/16 2133  WBC 12.5*  NEUTROABS 9.2*  HGB 12.9  HCT 42.2  MCV 90.0  PLT 123XX123   Basic Metabolic Panel:  Recent Labs Lab 04/03/16 2133  NA 136  K 3.4*  CL 100*  CO2 27  GLUCOSE 208*  BUN 20  CREATININE 0.73  CALCIUM 8.8*   GFR: Estimated Creatinine Clearance: 93.3 mL/min (by C-G formula based on SCr of 0.73 mg/dL). Liver Function Tests:  Recent Labs Lab 04/03/16 2133  AST 36  ALT 31  ALKPHOS 96  BILITOT 0.6  PROT 6.9  ALBUMIN 3.6   Urine analysis:    Component Value Date/Time   COLORURINE AMBER 04/03/2016 2218   APPEARANCEUR TURBID 04/03/2016 2218   LABSPEC 1.011 04/03/2016 2218   PHURINE 8.0 04/03/2016 2218   GLUCOSEU NEGATIVE 04/03/2016 2218   HGBUR SMALL (A) 04/03/2016 2218   BILIRUBINUR NEGATIVE 04/03/2016 2218   KETONESUR NEGATIVE 04/03/2016 2218   PROTEINUR 100 (A) 04/03/2016 2218   UROBILINOGEN 0.2 09/08/2014 0436   NITRITE POSITIVE (A) 04/03/2016 2218   LEUKOCYTESUR LARGE (A) 04/03/2016 2218   Radiological Exams on Admission: Dg Chest Portable 1 View  Result Date: 04/03/2016 CLINICAL DATA:  Cough and congestion EXAM: PORTABLE CHEST 1 VIEW COMPARISON:  September 08, 2014 FINDINGS: There is mild atelectasis in the right lower lung zone. Lungs elsewhere are clear. Heart size and pulmonary vascularity are normal. No adenopathy. There is a degree of acromioclavicular separation on the right. IMPRESSION: Mild right base atelectasis. No edema or consolidation. Right-sided acromioclavicular separation. Electronically Signed   By: Lowella Grip III M.D.   On: 04/03/2016 22:17    EKG: Independently reviewed.   Assessment/Plan Principal Problem:   Cellulitis Active Problems:   Diabetes mellitus without complication (HCC)   Pyelonephritis   GERD (gastroesophageal reflux disease)   UTI (urinary tract infection)   Essential  hypertension    PLAN:   Cellulitis:  She has several allergies to antibiotics including  Rocephin, [employer]? And Vancomycin (only a rash), and likely may be able to take them, however, will give IV Bactrim.  The redness has been demarcated for comparison.  Note that she had a minimally displaced spiral Fx of the left proximal Femur, but that was 3 months ago, and I don't think it has anything to do with her cellulitis.  But if it's not better quickly with IV antibiotics, would have low threadhold for imaging her leg.   UTI:  Consider urology consultation for suprapubic catheter placement.  Continue with IV Bactrim.   GERD:  Continue meds.  Bipolar illness:  Continue with her meds.   HTN:  BP is very controlled. Continue with meds.     DVT prophylaxis: Lovenox.  Code Status: FULL CODEE.  Confirmed tonight.  Family Communication: None.  Disposition Plan: to SNF when stable.  Consults called: None.  Admission status: Inpatient.    Daemien Fronczak MD FACP. [facility]  If 7PM-7AM, please contact night-coverage [email or web address] Password [email or web address]  04/03/2016, 11:54 PM

## 2016-04-04 DIAGNOSIS — R739 Hyperglycemia, unspecified: Secondary | ICD-10-CM

## 2016-04-04 DIAGNOSIS — E876 Hypokalemia: Secondary | ICD-10-CM | POA: Diagnosis present

## 2016-04-04 DIAGNOSIS — N12 Tubulo-interstitial nephritis, not specified as acute or chronic: Secondary | ICD-10-CM

## 2016-04-04 LAB — BASIC METABOLIC PANEL
ANION GAP: 8 (ref 5–15)
BUN: 15 mg/dL (ref 6–20)
CHLORIDE: 101 mmol/L (ref 101–111)
CO2: 25 mmol/L (ref 22–32)
Calcium: 8.2 mg/dL — ABNORMAL LOW (ref 8.9–10.3)
Creatinine, Ser: 0.56 mg/dL (ref 0.44–1.00)
GFR calc Af Amer: >60 mL/min (ref >60)
GLUCOSE: 243 mg/dL — AB (ref 65–99)
POTASSIUM: 2.7 mmol/L — AB (ref 3.5–5.1)
SODIUM: 134 mmol/L — AB (ref 135–145)

## 2016-04-04 LAB — CBC
HEMATOCRIT: 38.1 % (ref 36.0–46.0)
HEMOGLOBIN: 11.9 g/dL — AB (ref 12.0–15.0)
MCH: 28 pg (ref 26.0–34.0)
MCHC: 31.2 g/dL (ref 30.0–36.0)
MCV: 89.6 fL (ref 78.0–100.0)
Platelets: 164 10*3/uL (ref 150–400)
RBC: 4.25 MIL/uL (ref 3.87–5.11)
RDW: 15.8 % — AB (ref 11.5–15.5)
WBC: 11.6 10*3/uL — ABNORMAL HIGH (ref 4.0–10.5)

## 2016-04-04 LAB — TSH: TSH: 14.509 u[IU]/mL — ABNORMAL HIGH (ref 0.350–4.500)

## 2016-04-04 LAB — MRSA PCR SCREENING: MRSA by PCR: NEGATIVE

## 2016-04-04 LAB — MAGNESIUM: MAGNESIUM: 1.5 mg/dL — AB (ref 1.7–2.4)

## 2016-04-04 LAB — T4, FREE: Free T4: 1.03 ng/dL (ref 0.61–1.12)

## 2016-04-04 MED ORDER — B COMPLEX-C PO TABS
1.0000 | ORAL_TABLET | Freq: Every day | ORAL | Status: DC
  Administered 2016-04-04 – 2016-04-06 (×3): 1 via ORAL
  Filled 2016-04-04 (×5): qty 1

## 2016-04-04 MED ORDER — ENOXAPARIN SODIUM 80 MG/0.8ML ~~LOC~~ SOLN
0.5000 mg/kg | SUBCUTANEOUS | Status: DC
  Administered 2016-04-04 – 2016-04-05 (×3): 65 mg via SUBCUTANEOUS
  Filled 2016-04-04 (×3): qty 0.8

## 2016-04-04 MED ORDER — OMEGA-3-ACID ETHYL ESTERS 1 G PO CAPS
1.0000 | ORAL_CAPSULE | Freq: Two times a day (BID) | ORAL | Status: DC
  Administered 2016-04-04 – 2016-04-06 (×5): 1 g via ORAL
  Filled 2016-04-04 (×14): qty 1

## 2016-04-04 MED ORDER — FLUTICASONE PROPIONATE 50 MCG/ACT NA SUSP
2.0000 | Freq: Every day | NASAL | Status: DC
  Administered 2016-04-04 – 2016-04-06 (×3): 2 via NASAL
  Filled 2016-04-04 (×2): qty 16

## 2016-04-04 MED ORDER — DIAZEPAM 5 MG PO TABS
5.0000 mg | ORAL_TABLET | Freq: Four times a day (QID) | ORAL | Status: DC | PRN
  Administered 2016-04-04: 5 mg via ORAL
  Filled 2016-04-04: qty 1

## 2016-04-04 MED ORDER — ASPIRIN EC 81 MG PO TBEC
81.0000 mg | DELAYED_RELEASE_TABLET | Freq: Every day | ORAL | Status: DC
  Administered 2016-04-04 – 2016-04-06 (×3): 81 mg via ORAL
  Filled 2016-04-04 (×3): qty 1

## 2016-04-04 MED ORDER — OXYCODONE-ACETAMINOPHEN 5-325 MG PO TABS
1.0000 | ORAL_TABLET | Freq: Two times a day (BID) | ORAL | Status: DC
  Administered 2016-04-04 – 2016-04-05 (×4): 1 via ORAL
  Filled 2016-04-04 (×5): qty 1

## 2016-04-04 MED ORDER — SULFAMETHOXAZOLE-TRIMETHOPRIM 400-80 MG/5ML IV SOLN
15.0000 mg/kg/d | Freq: Three times a day (TID) | INTRAVENOUS | Status: DC
  Administered 2016-04-04: 635.2 mg via INTRAVENOUS
  Filled 2016-04-04 (×2): qty 39.7

## 2016-04-04 MED ORDER — ACETAMINOPHEN 325 MG PO TABS
325.0000 mg | ORAL_TABLET | Freq: Two times a day (BID) | ORAL | Status: DC
  Administered 2016-04-04 – 2016-04-05 (×4): 325 mg via ORAL
  Filled 2016-04-04 (×5): qty 1

## 2016-04-04 MED ORDER — LEVOTHYROXINE SODIUM 25 MCG PO TABS
25.0000 ug | ORAL_TABLET | Freq: Every day | ORAL | Status: DC
  Administered 2016-04-04 – 2016-04-06 (×3): 25 ug via ORAL
  Filled 2016-04-04 (×3): qty 1

## 2016-04-04 MED ORDER — SODIUM CHLORIDE 0.9% FLUSH
3.0000 mL | Freq: Two times a day (BID) | INTRAVENOUS | Status: DC
  Administered 2016-04-04 – 2016-04-05 (×4): 3 mL via INTRAVENOUS

## 2016-04-04 MED ORDER — LEVOTHYROXINE SODIUM 100 MCG PO TABS
200.0000 ug | ORAL_TABLET | Freq: Every day | ORAL | Status: DC
  Administered 2016-04-04 – 2016-04-06 (×3): 200 ug via ORAL
  Filled 2016-04-04 (×3): qty 2

## 2016-04-04 MED ORDER — INSULIN ASPART 100 UNIT/ML ~~LOC~~ SOLN: 3.0000 [IU] | Freq: Three times a day (TID) | SUBCUTANEOUS | Status: DC

## 2016-04-04 MED ORDER — LEVOTHYROXINE SODIUM 100 MCG PO TABS: 200.0000 ug | ORAL_TABLET | Freq: Every day | ORAL | Status: DC

## 2016-04-04 MED ORDER — TOPIRAMATE 25 MG PO TABS
ORAL_TABLET | ORAL | Status: AC
  Filled 2016-04-04: qty 2

## 2016-04-04 MED ORDER — INSULIN ASPART 100 UNIT/ML ~~LOC~~ SOLN: 0.0000 [IU] | Freq: Every day | SUBCUTANEOUS | Status: DC

## 2016-04-04 MED ORDER — SULFAMETHOXAZOLE-TRIMETHOPRIM 400-80 MG/5ML IV SOLN
5.0000 mg/kg | Freq: Three times a day (TID) | INTRAVENOUS | Status: DC
  Administered 2016-04-04 – 2016-04-06 (×6): 603.2 mg via INTRAVENOUS
  Filled 2016-04-04 (×10): qty 37.7

## 2016-04-04 MED ORDER — INSULIN ASPART 100 UNIT/ML ~~LOC~~ SOLN: 0.0000 [IU] | Freq: Three times a day (TID) | SUBCUTANEOUS | Status: DC

## 2016-04-04 MED ORDER — QUETIAPINE FUMARATE 25 MG PO TABS
ORAL_TABLET | ORAL | Status: AC
  Filled 2016-04-04: qty 2

## 2016-04-04 MED ORDER — IPRATROPIUM-ALBUTEROL 0.5-2.5 (3) MG/3ML IN SOLN: 3.0000 mL | Freq: Four times a day (QID) | RESPIRATORY_TRACT | Status: DC | PRN

## 2016-04-04 MED ORDER — CALCIUM CARBONATE ANTACID 500 MG PO CHEW: 2.0000 | CHEWABLE_TABLET | Freq: Four times a day (QID) | ORAL | Status: DC | PRN

## 2016-04-04 MED ORDER — SULFAMETHOXAZOLE-TRIMETHOPRIM 400-80 MG/5ML IV SOLN
INTRAVENOUS | Status: AC
  Filled 2016-04-04: qty 1

## 2016-04-04 MED ORDER — ALLOPURINOL 100 MG PO TABS
100.0000 mg | ORAL_TABLET | Freq: Every day | ORAL | Status: DC
  Administered 2016-04-04 – 2016-04-06 (×3): 100 mg via ORAL
  Filled 2016-04-04 (×3): qty 1

## 2016-04-04 MED ORDER — TOPIRAMATE 25 MG PO TABS
50.0000 mg | ORAL_TABLET | Freq: Two times a day (BID) | ORAL | Status: DC
  Administered 2016-04-04 – 2016-04-06 (×6): 50 mg via ORAL
  Filled 2016-04-04 (×7): qty 2

## 2016-04-04 MED ORDER — SIMETHICONE 80 MG PO CHEW
80.0000 mg | CHEWABLE_TABLET | Freq: Three times a day (TID) | ORAL | Status: DC | PRN
Start: 2016-04-04 — End: 2016-04-06

## 2016-04-04 MED ORDER — CARVEDILOL 12.5 MG PO TABS
12.5000 mg | ORAL_TABLET | Freq: Two times a day (BID) | ORAL | Status: DC
  Administered 2016-04-04 – 2016-04-06 (×5): 12.5 mg via ORAL
  Filled 2016-04-04 (×5): qty 1

## 2016-04-04 MED ORDER — BUPROPION HCL ER (XL) 300 MG PO TB24
300.0000 mg | ORAL_TABLET | Freq: Every day | ORAL | Status: DC
Start: 2016-04-04 — End: 2016-04-06
  Administered 2016-04-04 – 2016-04-06 (×3): 300 mg via ORAL
  Filled 2016-04-04 (×6): qty 1

## 2016-04-04 MED ORDER — EZETIMIBE 10 MG PO TABS
10.0000 mg | ORAL_TABLET | Freq: Every day | ORAL | Status: DC
  Administered 2016-04-04 – 2016-04-06 (×3): 10 mg via ORAL
  Filled 2016-04-04 (×3): qty 1

## 2016-04-04 MED ORDER — GUAIFENESIN 100 MG/5ML PO SYRP
100.0000 mg | ORAL_SOLUTION | ORAL | Status: DC | PRN
  Administered 2016-04-04 – 2016-04-05 (×2): 100 mg via ORAL
  Filled 2016-04-04 (×3): qty 5

## 2016-04-04 MED ORDER — QUETIAPINE FUMARATE 25 MG PO TABS
50.0000 mg | ORAL_TABLET | Freq: Every day | ORAL | Status: DC
  Administered 2016-04-04 – 2016-04-05 (×3): 50 mg via ORAL
  Filled 2016-04-04: qty 2
  Filled 2016-04-04: qty 1
  Filled 2016-04-04 (×2): qty 2

## 2016-04-04 MED ORDER — DOCUSATE SODIUM 100 MG PO CAPS: 100.0000 mg | ORAL_CAPSULE | Freq: Two times a day (BID) | ORAL | Status: DC | PRN

## 2016-04-04 MED ORDER — SODIUM CHLORIDE 0.9 % IV SOLN
INTRAVENOUS | Status: DC
  Administered 2016-04-04 – 2016-04-05 (×2): via INTRAVENOUS

## 2016-04-04 MED ORDER — POTASSIUM CHLORIDE CRYS ER 20 MEQ PO TBCR
60.0000 meq | EXTENDED_RELEASE_TABLET | Freq: Once | ORAL | Status: AC
  Administered 2016-04-04: 60 meq via ORAL
  Filled 2016-04-04: qty 3

## 2016-04-04 MED ORDER — PANTOPRAZOLE SODIUM 40 MG PO TBEC
40.0000 mg | DELAYED_RELEASE_TABLET | Freq: Every day | ORAL | Status: DC
  Administered 2016-04-04: 40 mg via ORAL
  Filled 2016-04-04: qty 1

## 2016-04-04 MED ORDER — ALBUTEROL SULFATE (2.5 MG/3ML) 0.083% IN NEBU: 3.0000 mL | INHALATION_SOLUTION | Freq: Three times a day (TID) | RESPIRATORY_TRACT | Status: DC | PRN

## 2016-04-04 MED ORDER — GABAPENTIN 300 MG PO CAPS
300.0000 mg | ORAL_CAPSULE | Freq: Three times a day (TID) | ORAL | Status: DC
  Administered 2016-04-04 – 2016-04-06 (×7): 300 mg via ORAL
  Filled 2016-04-04 (×8): qty 1

## 2016-04-04 MED ORDER — VITAMIN D 1000 UNITS PO TABS
1000.0000 [IU] | ORAL_TABLET | Freq: Every day | ORAL | Status: DC
  Administered 2016-04-04 – 2016-04-06 (×3): 1000 [IU] via ORAL
  Filled 2016-04-04 (×3): qty 1

## 2016-04-04 MED ORDER — PANTOPRAZOLE SODIUM 40 MG PO TBEC
40.0000 mg | DELAYED_RELEASE_TABLET | Freq: Two times a day (BID) | ORAL | Status: DC
  Administered 2016-04-05 – 2016-04-06 (×3): 40 mg via ORAL
  Filled 2016-04-04 (×4): qty 1

## 2016-04-04 NOTE — ED Notes (Signed)
Attempted to call report to [facility], line busy.

## 2016-04-04 NOTE — ED Notes (Signed)
Spoke w/ Margrete at [facility], advised pt being admitted to the hospital.

## 2016-04-04 NOTE — Progress Notes (Signed)
PROGRESS NOTE    [name]  [id number]  DOB: Jun 26, 1945  DOA: 04/03/2016 PCP: [name], MD  Hospital course: HPI:  [name] is an 71 y.o. female with hx of lower extremity paralysis, COPD, Bipolar disorder, chronic foley, HTN, HLD, presented to the ER as she has cellulitis of the left thigh for a few days, along with a non productive coughs.   She was found to also has a UTI, and due to her various antibiotic allergies, she was started on Cipro.  The area of redness did not recede, thus she was sent into the ER.  She denied pain, fever, chills, SOB, CP, or any other symptomology.  She said her foley often becomes cloudy, and her urethra has been "blocked" frequently causing some leakage.  Assessment & Plan:   Principal Problem:   Cellulitis Active Problems:   Diabetes mellitus without complication (HCC)   Pyelonephritis   GERD (gastroesophageal reflux disease)   UTI (urinary tract infection)   Essential hypertension   Hypokalemia   Hyperglycemia  Cellulitis of LLE - Pt was started on IV bactrim, will follow blood cultures and monitor clinically.  If no improvement, consider starting Unasyn.    Uncontrolled diabetes mellitus - check A1c, start sliding scale coverage and prandial coverage, follow and adjust therapy. TSH elevated. Check Free T4.   UTI - urine culture pending, IV bactrim ordered as above.  Will try to obtain urology consult in AM for consideration of suprapubic catheter.   GERD - continue current medications. Stable.  Bipolar disease - Continue home medications. Stable.   Essential Hypertension - continue home medications. Stable.   URI - pt reports that symptoms are improving.  Suspect viral URI.  Follow clinically.   DVT prophylaxis: Lovenox.  Code Status: FULL CODE.  Confirmed tonight.  Family Communication: patient  Disposition Plan: to SNF when stable.  Consults called: None.  Admission status: Inpatient.   Subjective: Pt reports that  her cough has gotten better.    Objective: Vitals:   04/03/16 2345 04/04/16 0000 04/04/16 0130 04/04/16 0411  BP:  131/77 131/87 (!) 110/54  Pulse: 92 94  88  Resp:    20  Temp:   99.3 F (37.4 C) 98.5 F (36.9 C)  TempSrc:   Oral Oral  SpO2: 97% 99%  97%  Weight:   120.5 kg (265 lb 10.5 oz) 120.5 kg (265 lb 10.5 oz)  Height:        Intake/Output Summary (Last 24 hours) at 04/04/16 1258 Last data filed at 04/04/16 0900  Gross per 24 hour  Intake           843.87 ml  Output             2200 ml  Net         -1356.13 ml   Filed Weights   04/03/16 2107 04/04/16 0130 04/04/16 0411  Weight: 127 kg (280 lb) 120.5 kg (265 lb 10.5 oz) 120.5 kg (265 lb 10.5 oz)    Exam:  General exam: morbidly obese, awake, alert, no distress.  Respiratory system: Clear. No increased work of breathing. Cardiovascular system: S1 & S2 heard, RRR. Gastrointestinal system: Abdomen is nondistended, soft and nontender. Normal bowel sounds heard. Central nervous system: Alert and oriented. No focal neurological deficits. Extremities: well demarcated area L thigh, hot and red     Data Reviewed: Basic Metabolic Panel:  Recent Labs Lab 04/03/16 2133 04/04/16 0552  NA 136 134*  K 3.4*  2.7*  CL 100* 101  CO2 27 25  GLUCOSE 208* 243*  BUN 20 15  CREATININE 0.73 0.56  CALCIUM 8.8* 8.2*  MG  --  1.5*   Liver Function Tests:  Recent Labs Lab 04/03/16 2133  AST 36  ALT 31  ALKPHOS 96  BILITOT 0.6  PROT 6.9  ALBUMIN 3.6   No results for input(s): LIPASE, AMYLASE in the last 168 hours. No results for input(s): AMMONIA in the last 168 hours. CBC:  Recent Labs Lab 04/03/16 2133 04/04/16 0552  WBC 12.5* 11.6*  NEUTROABS 9.2*  --   HGB 12.9 11.9*  HCT 42.2 38.1  MCV 90.0 89.6  PLT 187 164   Cardiac Enzymes: No results for input(s): CKTOTAL, CKMB, CKMBINDEX, TROPONINI in the last 168 hours. CBG (last 3)  No results for input(s): GLUCAP in the last 72 hours. Recent Results (from  the past 240 hour(s))  MRSA PCR Screening     Status: None   Collection Time: 04/04/16  1:34 AM  Result Value Ref Range Status   MRSA by PCR NEGATIVE NEGATIVE Final    Comment:        The GeneXpert MRSA Assay (FDA approved for NASAL specimens only), is one component of a comprehensive MRSA colonization surveillance program. It is not intended to diagnose MRSA infection nor to guide or monitor treatment for MRSA infections.     Studies: Dg Chest Portable 1 View  Result Date: 04/03/2016 CLINICAL DATA:  Cough and congestion EXAM: PORTABLE CHEST 1 VIEW COMPARISON:  September 08, 2014 FINDINGS: There is mild atelectasis in the right lower lung zone. Lungs elsewhere are clear. Heart size and pulmonary vascularity are normal. No adenopathy. There is a degree of acromioclavicular separation on the right. IMPRESSION: Mild right base atelectasis. No edema or consolidation. Right-sided acromioclavicular separation. Electronically Signed   By: Lowella Grip III M.D.   On: 04/03/2016 22:17   Scheduled Meds: . acetaminophen  325 mg Oral BID  . allopurinol  100 mg Oral Daily  . aspirin EC  81 mg Oral Daily  . B-complex with vitamin C  1 tablet Oral Daily  . buPROPion  300 mg Oral Daily  . carvedilol  12.5 mg Oral BID WC  . cholecalciferol  1,000 Units Oral Daily  . enoxaparin (LOVENOX) injection  0.5 mg/kg Subcutaneous Q24H  . ezetimibe  10 mg Oral Daily  . fluticasone  2 spray Each Nare Daily  . gabapentin  300 mg Oral TID  . insulin aspart  0-15 Units Subcutaneous TID WC  . insulin aspart  0-15 Units Subcutaneous TID WC  . insulin aspart  0-5 Units Subcutaneous QHS  . insulin aspart  3 Units Subcutaneous TID WC  . levothyroxine  200 mcg Oral QAC breakfast   And  . levothyroxine  25 mcg Oral QAC breakfast  . omega-3 acid ethyl esters  1 capsule Oral BID  . oxyCODONE-acetaminophen  1 tablet Oral BID  . pantoprazole  40 mg Oral Daily  . QUEtiapine  50 mg Oral QHS  . sodium chloride flush   3 mL Intravenous Q12H  . sulfamethoxazole-trimethoprim  5 mg/kg Intravenous Q8H  . topiramate  50 mg Oral BID   Continuous Infusions: . sodium chloride 50 mL/hr at 04/04/16 0143   Principal Problem:   Cellulitis Active Problems:   Diabetes mellitus without complication (HCC)   Pyelonephritis   GERD (gastroesophageal reflux disease)   UTI (urinary tract infection)   Essential hypertension   Hypokalemia  Hyperglycemia  Time spent:   Irwin Brakeman, MD, FAAFP [facility] Pager [phone]  If 7PM-7AM, please contact night-coverage [email or web address] Password [email or web address] 04/04/2016, 12:58 PM    LOS: 1 day

## 2016-04-04 NOTE — Progress Notes (Signed)
Inpatient Diabetes Program Recommendations  AACE/ADA: New Consensus Statement on Inpatient Glycemic Control (2015)  Target Ranges:  Prepandial:   less than 140 mg/dL      Peak postprandial:   less than 180 mg/dL (1-2 hours)      Critically ill patients:  140 - 180 mg/dL   Lab Results  Component Value Date   GLUCAP 105 (H) 09/10/2014   HGBA1C 5.9 (H) 03/29/2014    Review of Glycemic Control Results for [name], [name] (MRN [id number]) as of 04/04/2016 09:34  Ref. Range 04/03/2016 21:33 04/04/2016 05:52  Glucose Latest Ref Range: 65 - 99 mg/dL 208 (H) 243 (H)   Diabetes history: DM2 Outpatient Diabetes medications: None Current orders for Inpatient glycemic control: None  Inpatient Diabetes Program Recommendations:  Noted lab glucose elevated @ 208 & 243. Please consider: -Novolog correction ac & hs 0-9 units tid + 0-5 units hs -A1c to determine prehospital glycemic control  Thank you, Bethena Roys E. Ersel Wadleigh, RN, MSN, CDE Inpatient Glycemic Control Team Team Pager [phone] (8am-5pm) 04/04/2016 9:37 AM

## 2016-04-05 DIAGNOSIS — N319 Neuromuscular dysfunction of bladder, unspecified: Secondary | ICD-10-CM

## 2016-04-05 LAB — CBC
HCT: 33.8 % — ABNORMAL LOW (ref 36.0–46.0)
Hemoglobin: 10.8 g/dL — ABNORMAL LOW (ref 12.0–15.0)
MCH: 28.1 pg (ref 26.0–34.0)
MCHC: 32 g/dL (ref 30.0–36.0)
MCV: 87.8 fL (ref 78.0–100.0)
Platelets: 154 10*3/uL (ref 150–400)
RBC: 3.85 MIL/uL — ABNORMAL LOW (ref 3.87–5.11)
RDW: 15.7 % — AB (ref 11.5–15.5)
WBC: 8.6 10*3/uL (ref 4.0–10.5)

## 2016-04-05 LAB — BASIC METABOLIC PANEL
Anion gap: 7 (ref 5–15)
BUN: 12 mg/dL (ref 6–20)
CALCIUM: 8.1 mg/dL — AB (ref 8.9–10.3)
CO2: 25 mmol/L (ref 22–32)
CREATININE: 0.61 mg/dL (ref 0.44–1.00)
Chloride: 102 mmol/L (ref 101–111)
GFR calc non Af Amer: >60 mL/min (ref >60)
Glucose, Bld: 136 mg/dL — ABNORMAL HIGH (ref 65–99)
Potassium: 3.7 mmol/L (ref 3.5–5.1)
SODIUM: 134 mmol/L — AB (ref 135–145)

## 2016-04-05 LAB — GLUCOSE, CAPILLARY: GLUCOSE-CAPILLARY: 183 mg/dL — AB (ref 65–99)

## 2016-04-05 LAB — HEMOGLOBIN A1C
HEMOGLOBIN A1C: 7.1 % — AB (ref 4.8–5.6)
MEAN PLASMA GLUCOSE: 157 mg/dL

## 2016-04-05 LAB — MAGNESIUM: MAGNESIUM: 1.6 mg/dL — AB (ref 1.7–2.4)

## 2016-04-05 MED ORDER — ACETAMINOPHEN 325 MG PO TABS
650.0000 mg | ORAL_TABLET | ORAL | Status: DC | PRN
  Administered 2016-04-05: 650 mg via ORAL
  Filled 2016-04-05: qty 2

## 2016-04-05 MED ORDER — OXYCODONE-ACETAMINOPHEN 5-325 MG PO TABS
2.0000 | ORAL_TABLET | Freq: Four times a day (QID) | ORAL | Status: DC | PRN
  Administered 2016-04-05: 2 via ORAL
  Filled 2016-04-05: qty 2

## 2016-04-05 NOTE — Progress Notes (Signed)
PROGRESS NOTE    [name]  [id number]  DOB: 1946-03-06  DOA: 04/03/2016 PCP: [name], MD  Hospital course: HPI:  [name] is an 71 y.o. female with hx of lower extremity paralysis, COPD, Bipolar disorder, chronic foley, HTN, HLD, presented to the ER as she has cellulitis of the left thigh for a few days, along with a non productive coughs.   She was found to also has a UTI, and due to her various antibiotic allergies, she was started on Cipro.  The area of redness did not recede, thus she was sent into the ER.  She denied pain, fever, chills, SOB, CP, or any other symptomology.  She said her foley often becomes cloudy, and her urethra has been "blocked" frequently causing some leakage.  Assessment & Plan:   Principal Problem:   Cellulitis Active Problems:   Diabetes mellitus without complication (HCC)   Pyelonephritis   GERD (gastroesophageal reflux disease)   UTI (urinary tract infection)   Essential hypertension   Hypokalemia   Hyperglycemia  Cellulitis of LLE - Improving clinically.  Continue IV bactrim, will follow blood cultures and monitor clinically.      Uncontrolled diabetes mellitus - A1c 7.1%, This is new diagnosis, pt reports no prior history of DM, had been refusing insulin and glucose tests, I spoke with her at length and she is agreement for temporary insulin and testing in hospital and likely go home on oral diabetes treatment.     Hypothyroidism - TSH markedly elevated. Normal free T4. Continue levothyroxine.   UTI - urine culture pending, IV bactrim ordered as above.  Obtain urology consult for consideration of suprapubic catheter as she is having chronic problems with foley.   GERD - continue current medications. Stable.  Bipolar disease - Continue home medications. Stable.   Essential Hypertension - continue home medications. Stable.   URI - pt reports that symptoms are improving.  Suspect viral URI.  Follow clinically.   DVT prophylaxis:  Lovenox.  Code Status: FULL CODE.  Confirmed tonight.  Family Communication: patient  Disposition Plan: to SNF when stable.  Consults called: None.  Admission status: Inpatient.   Subjective: Pt has no complaints today.    Objective: Vitals:   04/04/16 0411 04/04/16 1735 04/04/16 2115 04/04/16 2329  BP: (!) 110/54 (!) 112/56 94/75   Pulse: 88 78 75   Resp: 20 18 18    Temp: 98.5 F (36.9 C) 98.2 F (36.8 C) 98.6 F (37 C)   TempSrc: Oral Oral Oral   SpO2: 97% 97% 95% 95%  Weight: 120.5 kg (265 lb 10.5 oz)     Height:        Intake/Output Summary (Last 24 hours) at 04/05/16 1021 Last data filed at 04/05/16 0900  Gross per 24 hour  Intake           2757.7 ml  Output             1825 ml  Net            932.7 ml   Filed Weights   04/03/16 2107 04/04/16 0130 04/04/16 0411  Weight: 127 kg (280 lb) 120.5 kg (265 lb 10.5 oz) 120.5 kg (265 lb 10.5 oz)    Exam:  General exam: morbidly obese, awake, alert, no distress.  Respiratory system: Clear. No increased work of breathing. Cardiovascular system: S1 & S2 heard, RRR. Gastrointestinal system: Abdomen is nondistended, soft and nontender. Normal bowel sounds heard. Central nervous system: Alert and  oriented. No focal neurological deficits. Extremities: well demarcated area L thigh,less redness, less heat, appears improving      Data Reviewed: Basic Metabolic Panel:  Recent Labs Lab 04/03/16 2133 04/04/16 0552 04/05/16 0606  NA 136 134* 134*  K 3.4* 2.7* 3.7  CL 100* 101 102  CO2 27 25 25   GLUCOSE 208* 243* 136*  BUN 20 15 12   CREATININE 0.73 0.56 0.61  CALCIUM 8.8* 8.2* 8.1*  MG  --  1.5* 1.6*   Liver Function Tests:  Recent Labs Lab 04/03/16 2133  AST 36  ALT 31  ALKPHOS 96  BILITOT 0.6  PROT 6.9  ALBUMIN 3.6   No results for input(s): LIPASE, AMYLASE in the last 168 hours. No results for input(s): AMMONIA in the last 168 hours. CBC:  Recent Labs Lab 04/03/16 2133 04/04/16 0552  04/05/16 0606  WBC 12.5* 11.6* 8.6  NEUTROABS 9.2*  --   --   HGB 12.9 11.9* 10.8*  HCT 42.2 38.1 33.8*  MCV 90.0 89.6 87.8  PLT 187 164 154   Cardiac Enzymes: No results for input(s): CKTOTAL, CKMB, CKMBINDEX, TROPONINI in the last 168 hours. CBG (last 3)  No results for input(s): GLUCAP in the last 72 hours. Recent Results (from the past 240 hour(s))  MRSA PCR Screening     Status: None   Collection Time: 04/04/16  1:34 AM  Result Value Ref Range Status   MRSA by PCR NEGATIVE NEGATIVE Final    Comment:        The GeneXpert MRSA Assay (FDA approved for NASAL specimens only), is one component of a comprehensive MRSA colonization surveillance program. It is not intended to diagnose MRSA infection nor to guide or monitor treatment for MRSA infections.     Studies: Dg Chest Portable 1 View  Result Date: 04/03/2016 CLINICAL DATA:  Cough and congestion EXAM: PORTABLE CHEST 1 VIEW COMPARISON:  September 08, 2014 FINDINGS: There is mild atelectasis in the right lower lung zone. Lungs elsewhere are clear. Heart size and pulmonary vascularity are normal. No adenopathy. There is a degree of acromioclavicular separation on the right. IMPRESSION: Mild right base atelectasis. No edema or consolidation. Right-sided acromioclavicular separation. Electronically Signed   By: Lowella Grip III M.D.   On: 04/03/2016 22:17   Scheduled Meds: . acetaminophen  325 mg Oral BID  . allopurinol  100 mg Oral Daily  . aspirin EC  81 mg Oral Daily  . B-complex with vitamin C  1 tablet Oral Daily  . buPROPion  300 mg Oral Daily  . carvedilol  12.5 mg Oral BID WC  . cholecalciferol  1,000 Units Oral Daily  . enoxaparin (LOVENOX) injection  0.5 mg/kg Subcutaneous Q24H  . ezetimibe  10 mg Oral Daily  . fluticasone  2 spray Each Nare Daily  . gabapentin  300 mg Oral TID  . insulin aspart  0-15 Units Subcutaneous TID WC  . insulin aspart  0-15 Units Subcutaneous TID WC  . insulin aspart  0-5 Units  Subcutaneous QHS  . insulin aspart  3 Units Subcutaneous TID WC  . levothyroxine  200 mcg Oral QAC breakfast   And  . levothyroxine  25 mcg Oral QAC breakfast  . omega-3 acid ethyl esters  1 capsule Oral BID  . oxyCODONE-acetaminophen  1 tablet Oral BID  . pantoprazole  40 mg Oral BID AC  . QUEtiapine  50 mg Oral QHS  . sodium chloride flush  3 mL Intravenous Q12H  . sulfamethoxazole-trimethoprim  5 mg/kg Intravenous Q8H  . topiramate  50 mg Oral BID   Continuous Infusions: . sodium chloride 50 mL/hr at 04/05/16 0335   Principal Problem:   Cellulitis Active Problems:   Diabetes mellitus without complication (HCC)   Pyelonephritis   GERD (gastroesophageal reflux disease)   UTI (urinary tract infection)   Essential hypertension   Hypokalemia   Hyperglycemia  Time spent:   Irwin Brakeman, MD, FAAFP [facility] Pager [phone]  If 7PM-7AM, please contact night-coverage [email or web address] Password [email or web address] 04/05/2016, 10:21 AM    LOS: 2 days

## 2016-04-05 NOTE — Consult Note (Signed)
Urology Consult   Physician requesting consult: Dr Irwin Brakeman  Reason for consult: NGB  History of Present Illness: [name] is a 71 y.o. female who was recently admitted to the hospital here with cellulitis and possible urinary tract infection.  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  She has been managed, from a urinary standpoint, with an indwelling Foley catheter since that time.  She states that frequently, the catheter becomes plugged.  She is on infrequent acetic acid irrigations of her catheter in her nursing home.  She states that, perhaps once a week, she is irrigated.  Oftentimes, the patient has to have her catheter changed once or twice a week.  She denies recent hematuria.  She states that when she has urinary tract infections, her urine.  We'll get foul-smelling and she may have, at times, mental status changes.  At her admission, there was a question of whether a suprapubic tube would help her situation.  Urologic consultation is requested regarding this.    Past Medical History:  Diagnosis Date  . Bipolar 1 disorder ([place])   . COPD with asthma ([place])   . Diabetes mellitus without complication ([place])   . GERD (gastroesophageal reflux disease)   . Hypertension   . Obesity   . 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 Pyelonephritis   . Thyroid disease     Past Surgical History:  Procedure Laterality Date  . ABDOMINAL HYSTERECTOMY    . BACK SURGERY       Current Hospital Medications: Scheduled Meds: . acetaminophen  325 mg Oral BID  . allopurinol  100 mg Oral Daily  . aspirin EC  81 mg Oral Daily  . B-complex with vitamin C  1 tablet Oral Daily  . buPROPion  300 mg Oral Daily  . carvedilol  12.5 mg Oral BID WC  . cholecalciferol  1,000 Units Oral Daily  . enoxaparin (LOVENOX) injection  0.5 mg/kg Subcutaneous Q24H  . ezetimibe  10 mg Oral Daily  .  fluticasone  2 spray Each Nare Daily  . gabapentin  300 mg Oral TID  . insulin aspart  0-15 Units Subcutaneous TID WC  . insulin aspart  0-15 Units Subcutaneous TID WC  . insulin aspart  0-5 Units Subcutaneous QHS  . insulin aspart  3 Units Subcutaneous TID WC  . levothyroxine  200 mcg Oral QAC breakfast   And  . levothyroxine  25 mcg Oral QAC breakfast  . omega-3 acid ethyl esters  1 capsule Oral BID  . oxyCODONE-acetaminophen  1 tablet Oral BID  . pantoprazole  40 mg Oral BID AC  . QUEtiapine  50 mg Oral QHS  . sodium chloride flush  3 mL Intravenous Q12H  . sulfamethoxazole-trimethoprim  5 mg/kg Intravenous Q8H  . topiramate  50 mg Oral BID   Continuous Infusions: . sodium chloride 50 mL/hr at 04/05/16 0335   PRN Meds:.albuterol, calcium carbonate, diazepam, docusate sodium, guaifenesin, ipratropium-albuterol, simethicone  Allergies:  Allergies  Allergen Reactions  . Invanz [Ertapenem] Other (See Comments)    Patient states allergy but is not certain   . Propranolol Hcl   . Rocephin [Ceftriaxone] Hives  . Vancomycin Hives    Family History  Problem Relation Age of Onset  . Adopted: Yes  . Heart disease Mother     Social History:  reports that she has quit smoking. She has never used smokeless  tobacco. She reports that she does not drink alcohol or use drugs.  ROS: A complete review of systems was performed.  All systems are negative except for pertinent findings as noted.  Physical Exam:  Vital signs in last 24 hours: Temp:  [98.2 F (36.8 C)-98.6 F (37 C)] 98.6 F (37 C) (01/08 2115) Pulse Rate:  [75-78] 75 (01/08 2115) Resp:  [18] 18 (01/08 2115) BP: (94-112)/(56-75) 94/75 (01/08 2115) SpO2:  [95 %-97 %] 95 % (01/08 2329) General:  Alert and oriented, No acute distress HEENT: Normocephalic, atraumatic Neck: No JVD or lymphadenopathy Cardiovascular: Regular rate and rhythm Lungs:Normal inspiratory and expiratory excursion Abdomen: Morbidly  obese.   Laboratory Data:   Recent Labs  04/03/16 2133 04/04/16 0552 04/05/16 0606  WBC 12.5* 11.6* 8.6  HGB 12.9 11.9* 10.8*  HCT 42.2 38.1 33.8*  PLT 187 164 154     Recent Labs  04/03/16 2133 04/04/16 0552 04/05/16 0606  NA 136 134* 134*  K 3.4* 2.7* 3.7  CL 100* 101 102  GLUCOSE 208* 243* 136*  BUN 20 15 12   CALCIUM 8.8* 8.2* 8.1*  CREATININE 0.73 0.56 0.61     Results for orders placed or performed during the hospital encounter of 04/03/16 (from the past 24 hour(s))  T4, free     Status: None   Collection Time: 04/04/16  1:49 PM  Result Value Ref Range   Free T4 1.03 0.61 - 1.12 ng/dL  CBC     Status: Abnormal   Collection Time: 04/05/16  6:06 AM  Result Value Ref Range   WBC 8.6 4.0 - 10.5 K/uL   RBC 3.85 (L) 3.87 - 5.11 MIL/uL   Hemoglobin 10.8 (L) 12.0 - 15.0 g/dL   HCT 33.8 (L) 36.0 - 46.0 %   MCV 87.8 78.0 - 100.0 fL   MCH 28.1 26.0 - 34.0 pg   MCHC 32.0 30.0 - 36.0 g/dL   RDW 15.7 (H) 11.5 - 15.5 %   Platelets 154 150 - 400 K/uL  Basic metabolic panel     Status: Abnormal   Collection Time: 04/05/16  6:06 AM  Result Value Ref Range   Sodium 134 (L) 135 - 145 mmol/L   Potassium 3.7 3.5 - 5.1 mmol/L   Chloride 102 101 - 111 mmol/L   CO2 25 22 - 32 mmol/L   Glucose, Bld 136 (H) 65 - 99 mg/dL   BUN 12 6 - 20 mg/dL   Creatinine, Ser 0.61 0.44 - 1.00 mg/dL   Calcium 8.1 (L) 8.9 - 10.3 mg/dL   GFR calc non Af Amer >60 >60 mL/min   GFR calc Af Amer >60 >60 mL/min   Anion gap 7 5 - 15  Magnesium     Status: Abnormal   Collection Time: 04/05/16  6:06 AM  Result Value Ref Range   Magnesium 1.6 (L) 1.7 - 2.4 mg/dL   Recent Results (from the past 240 hour(s))  MRSA PCR Screening     Status: None   Collection Time: 04/04/16  1:34 AM  Result Value Ref Range Status   MRSA by PCR NEGATIVE NEGATIVE Final    Comment:        The GeneXpert MRSA Assay (FDA approved for NASAL specimens only), is one component of a comprehensive MRSA  colonization surveillance program. It is not intended to diagnose MRSA infection nor to guide or monitor treatment for MRSA infections.     Renal Function:  Recent Labs  04/03/16 2133 04/04/16 0552 04/05/16  0606  CREATININE 0.73 0.56 0.61   Estimated Creatinine Clearance: 90.5 mL/min (by C-G formula based on SCr of 0.61 mg/dL).  Radiologic Imaging: Dg Chest Portable 1 View  Result Date: 04/03/2016 CLINICAL DATA:  Cough and congestion EXAM: PORTABLE CHEST 1 VIEW COMPARISON:  September 08, 2014 FINDINGS: There is mild atelectasis in the right lower lung zone. Lungs elsewhere are clear. Heart size and pulmonary vascularity are normal. No adenopathy. There is a degree of acromioclavicular separation on the right. IMPRESSION: Mild right base atelectasis. No edema or consolidation. Right-sided acromioclavicular separation. Electronically Signed   By: Lowella Grip III M.D.   On: 04/03/2016 22:17   That I can see, the patient has had no recent imaging study of the kidneys or bladder. I independently reviewed the above imaging studies.  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  This is managed with an indwelling Foley catheter through the urethra.  The patient has expected complications from this, i.e. recurrent urinary tract infections and tube obstruction at times.  Complications from catheterization, i.e. recurrent infections, catheter blockage.  The patient, unfortunately, is in a nursing home and does not get irrigation of her catheter as she should.  Plan:  1.  I would strongly recommend that the patient get catheter irrigations twice a day at the nursing home with acetic acid.  This would involve 70 mL of the acetic acid, being administered through a catheter tip syringe, in/out, agitating it a couple of times, rather than just placing the acetic acid in the bladder and letting it drain out passively.  I think that this would  significantly help the patient's bladder catheter clogging.  2.  I do not think the patient is a candidate for suprapubic tube.  She has significant pannus in her suprapubic area, and the distance between her skin and the bladder is significantly long.  I don't think that this could adequately be bridged with a regular catheter.  3.  At her discharge, I would strongly recommend that, as part of the instruction for the nursing home, that 70 mL of acetic acid, 0.25%, be utilized to irrigate her catheter twice a day.  I will sign off on this patient.  If further urologic follow-up needed, please call.

## 2016-04-05 NOTE — Discharge Instructions (Signed)
Foley Catheter Care Instructions:  70 mL of acetic acid, 0.25%, be utilized to irrigate catheter twice a day.

## 2016-04-06 DIAGNOSIS — E785 Hyperlipidemia, unspecified: Secondary | ICD-10-CM | POA: Diagnosis not present

## 2016-04-06 DIAGNOSIS — E119 Type 2 diabetes mellitus without complications: Secondary | ICD-10-CM | POA: Diagnosis not present

## 2016-04-06 DIAGNOSIS — G9340 Encephalopathy, unspecified: Secondary | ICD-10-CM | POA: Diagnosis not present

## 2016-04-06 DIAGNOSIS — T83511A Infection and inflammatory reaction due to indwelling urethral catheter, initial encounter: Secondary | ICD-10-CM

## 2016-04-06 DIAGNOSIS — S72345A Nondisplaced spiral fracture of shaft of left femur, initial encounter for closed fracture: Secondary | ICD-10-CM | POA: Diagnosis not present

## 2016-04-06 DIAGNOSIS — N10 Acute tubulo-interstitial nephritis: Secondary | ICD-10-CM | POA: Diagnosis not present

## 2016-04-06 DIAGNOSIS — N390 Urinary tract infection, site not specified: Secondary | ICD-10-CM | POA: Diagnosis not present

## 2016-04-06 DIAGNOSIS — F419 Anxiety disorder, unspecified: Secondary | ICD-10-CM | POA: Diagnosis not present

## 2016-04-06 DIAGNOSIS — M869 Osteomyelitis, unspecified: Secondary | ICD-10-CM | POA: Diagnosis not present

## 2016-04-06 DIAGNOSIS — I10 Essential (primary) hypertension: Secondary | ICD-10-CM

## 2016-04-06 DIAGNOSIS — G8929 Other chronic pain: Secondary | ICD-10-CM | POA: Diagnosis not present

## 2016-04-06 DIAGNOSIS — F339 Major depressive disorder, recurrent, unspecified: Secondary | ICD-10-CM | POA: Diagnosis not present

## 2016-04-06 DIAGNOSIS — E871 Hypo-osmolality and hyponatremia: Secondary | ICD-10-CM | POA: Diagnosis not present

## 2016-04-06 DIAGNOSIS — M4645 Discitis, unspecified, thoracolumbar region: Secondary | ICD-10-CM | POA: Diagnosis not present

## 2016-04-06 DIAGNOSIS — D72829 Elevated white blood cell count, unspecified: Secondary | ICD-10-CM | POA: Diagnosis not present

## 2016-04-06 DIAGNOSIS — E139 Other specified diabetes mellitus without complications: Secondary | ICD-10-CM | POA: Diagnosis not present

## 2016-04-06 DIAGNOSIS — E876 Hypokalemia: Secondary | ICD-10-CM | POA: Diagnosis not present

## 2016-04-06 DIAGNOSIS — E039 Hypothyroidism, unspecified: Secondary | ICD-10-CM | POA: Diagnosis not present

## 2016-04-06 DIAGNOSIS — K219 Gastro-esophageal reflux disease without esophagitis: Secondary | ICD-10-CM | POA: Diagnosis not present

## 2016-04-06 DIAGNOSIS — N12 Tubulo-interstitial nephritis, not specified as acute or chronic: Secondary | ICD-10-CM | POA: Diagnosis not present

## 2016-04-06 DIAGNOSIS — M4300 Spondylolysis, site unspecified: Secondary | ICD-10-CM | POA: Diagnosis not present

## 2016-04-06 DIAGNOSIS — M4646 Discitis, unspecified, lumbar region: Secondary | ICD-10-CM | POA: Diagnosis not present

## 2016-04-06 DIAGNOSIS — R739 Hyperglycemia, unspecified: Secondary | ICD-10-CM | POA: Diagnosis not present

## 2016-04-06 DIAGNOSIS — L03116 Cellulitis of left lower limb: Principal | ICD-10-CM

## 2016-04-06 DIAGNOSIS — Z7401 Bed confinement status: Secondary | ICD-10-CM | POA: Diagnosis not present

## 2016-04-06 DIAGNOSIS — L0390 Cellulitis, unspecified: Secondary | ICD-10-CM | POA: Diagnosis not present

## 2016-04-06 DIAGNOSIS — D649 Anemia, unspecified: Secondary | ICD-10-CM | POA: Diagnosis not present

## 2016-04-06 DIAGNOSIS — F411 Generalized anxiety disorder: Secondary | ICD-10-CM | POA: Diagnosis not present

## 2016-04-06 DIAGNOSIS — N3289 Other specified disorders of bladder: Secondary | ICD-10-CM | POA: Diagnosis not present

## 2016-04-06 DIAGNOSIS — R279 Unspecified lack of coordination: Secondary | ICD-10-CM | POA: Diagnosis not present

## 2016-04-06 LAB — GLUCOSE, CAPILLARY
GLUCOSE-CAPILLARY: 108 mg/dL — AB (ref 65–99)
Glucose-Capillary: 129 mg/dL — ABNORMAL HIGH (ref 65–99)

## 2016-04-06 LAB — URINE CULTURE

## 2016-04-06 MED ORDER — POTASSIUM CHLORIDE ER 20 MEQ PO TBCR: 10.0000 meq | EXTENDED_RELEASE_TABLET | Freq: Every day | ORAL | Status: DC

## 2016-04-06 MED ORDER — METFORMIN HCL 500 MG PO TABS: 500.0000 mg | ORAL_TABLET | Freq: Every day | ORAL | Status: DC

## 2016-04-06 MED ORDER — ACETIC ACID 0.25 % IR SOLN: Freq: Two times a day (BID) | 12 refills | Status: DC

## 2016-04-06 MED ORDER — SULFAMETHOXAZOLE-TRIMETHOPRIM 800-160 MG PO TABS: 1.0000 | ORAL_TABLET | Freq: Two times a day (BID) | ORAL | Status: DC

## 2016-04-06 NOTE — Care Management Important Message (Signed)
Important Message  Patient Details  Name: [name] MRN: [id number] Date of Birth: 1945-09-06   Medicare Important Message Given:  Yes    Evora Schechter, Chauncey Reading, RN 04/06/2016, 11:38 AM

## 2016-04-06 NOTE — NC FL2 (Signed)
[place] LEVEL OF CARE SCREENING TOOL     IDENTIFICATION  Patient Name: [name] Birthdate: [birth year] Sex: female Admission Date (Current Location): 04/03/2016  [place] and [place] Number:  [place] [id number] [place] and Address:  [facility] [address], [place]      Provider Number: [id number]  Attending Physician Name and Address:  Kathie Dike, MD  Relative Name and Phone Number:       Current Level of Care: Hospital Recommended Level of Care: [place] Prior Approval Number:    Date Approved/Denied:   PASRR Number: [id number]  Discharge Plan: SNF    Current Diagnoses: Patient Active Problem List   Diagnosis Date Noted  . Hypokalemia 04/04/2016  . Hyperglycemia 04/04/2016  . Cellulitis 04/03/2016  . Sepsis ([place]) 09/08/2014  . Chronic paraplegia ([place]) 09/08/2014  . Swelling of right lower extremity 07/21/2014  . Discitis of lumbar region   . Abscess in epidural space of lumbar spine   . Blood poisoning ([place])   . Osteomyelitis ([place])   . Discitis of thoracolumbar region   . Anxiety state   . Chronic pain syndrome   . Sepsis due to Candida species ([place])   . Spondylarthrosis   . Acute renal failure syndrome ([place])   . Other specified hypothyroidism   . Essential hypertension   . Paraplegia ([place])   . Enteritis due to Clostridium difficile 05/08/2014  . UTI (urinary tract infection) 05/08/2014  . Chronic indwelling Foley catheter 05/08/2014  . Hypotension   . Decubitus ulcer of sacral region, unstageable ([place]) 03/28/2014  . Hypothyroid 03/28/2014  . Depression 03/28/2014  . ARF (acute renal failure) ([place]) 03/28/2014  . Acute respiratory failure ([place]) 03/27/2014  . Pyelonephritis 10/22/2013  . Leukocytosis 10/22/2013  . Hyponatremia 10/22/2013  . Encephalopathy acute 10/22/2013  . Morbid obesity ([place]) 10/22/2013  . Hypothyroidism 10/22/2013  . Hypertension   . Bipolar 1 disorder  ([place])   . Thyroid disease   . Diabetes mellitus without complication ([place])   . GERD (gastroesophageal reflux disease)   . SHOULDER PAIN 11/19/2007  . HAND, ARTHRITIS, DEGEN./OSTEO 09/03/2007  . 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 CARPAL TUNNEL SYNDROME 05/16/2007    Orientation RESPIRATION BLADDER Height & Weight     Self, Time, Situation, Place  Normal Indwelling catheter Weight: 265 lb 10.5 oz (120.5 kg) Height:  5\' 7"  (170.2 cm)  BEHAVIORAL SYMPTOMS/MOOD NEUROLOGICAL BOWEL NUTRITION STATUS  Other (Comment) (none)  (n/a) Incontinent Diet (Carb modified)  AMBULATORY STATUS COMMUNICATION OF NEEDS Skin   Total Care Verbally Other (Comment) (excoriated bilateral buttocks)                       Personal Care Assistance Level of Assistance  Bathing, Feeding, Dressing, Total care       Total Care Assistance: Maximum assistance   Functional Limitations Info  Sight, Hearing, Speech Sight Info: Adequate Hearing Info: Adequate Speech Info: Adequate    SPECIAL CARE FACTORS FREQUENCY                       Contractures      Additional Factors Info  Insulin Sliding Scale Code Status Info: Full code Allergies Info: Invanz (Ertapenem), Propranolol Hcl, Rocephin (Ceftriazone), Vancomycin   Insulin Sliding Scale Info: Seroqul, Valium       Current Medications (04/06/2016):  This is the current hospital active medication list Current Facility-Administered Medications  Medication Dose Route Frequency Provider Last  Rate Last Dose  . acetaminophen (TYLENOL) tablet 650 mg  650 mg Oral Q4H PRN Clanford Marisa Hua, MD   650 mg at 04/05/16 1555  . albuterol (PROVENTIL) (2.5 MG/3ML) 0.083% nebulizer solution 3 mL  3 mL Inhalation Q8H PRN [name], MD      . allopurinol (ZYLOPRIM) tablet 100 mg  100 mg Oral Daily [name], MD   100 mg at 04/05/16 1043  . aspirin EC tablet 81 mg  81 mg Oral Daily [name], MD   81 mg at 04/05/16 1044  . B-complex with vitamin C tablet 1 tablet  1 tablet  Oral Daily [name], MD   1 tablet at 04/05/16 1044  . buPROPion (WELLBUTRIN XL) 24 hr tablet 300 mg  300 mg Oral Daily [name], MD   300 mg at 04/05/16 1043  . calcium carbonate (TUMS - dosed in mg elemental calcium) chewable tablet 400 mg of elemental calcium  2 tablet Oral Q6H PRN [name], MD      . carvedilol (COREG) tablet 12.5 mg  12.5 mg Oral BID WC [name], MD   12.5 mg at 04/06/16 [id number]  . cholecalciferol (VITAMIN D) tablet 1,000 Units  1,000 Units Oral Daily [name], MD   1,000 Units at 04/05/16 1043  . diazepam (VALIUM) tablet 5 mg  5 mg Oral Q6H PRN [name], MD   5 mg at 04/04/16 1413  . docusate sodium (COLACE) capsule 100 mg  100 mg Oral Q12H PRN [name], MD      . enoxaparin (LOVENOX) injection 65 mg  0.5 mg/kg Subcutaneous Q24H [name], MD   65 mg at 04/05/16 2332  . ezetimibe (ZETIA) tablet 10 mg  10 mg Oral Daily [name], MD   10 mg at 04/05/16 1043  . fluticasone (FLONASE) 50 MCG/ACT nasal spray 2 spray  2 spray Each Nare Daily [name], MD   2 spray at 04/05/16 1044  . gabapentin (NEURONTIN) capsule 300 mg  300 mg Oral TID [name], MD   300 mg at 04/05/16 2050  . guaifenesin (ROBITUSSIN) 100 MG/5ML syrup 100 mg  100 mg Oral Q4H PRN [name], MD   100 mg at 04/05/16 2103  . insulin aspart (novoLOG) injection 0-15 Units  0-15 Units Subcutaneous TID WC [name], MD      . insulin aspart (novoLOG) injection 0-5 Units  0-5 Units Subcutaneous QHS [name], MD      . insulin aspart (novoLOG) injection 3 Units  3 Units Subcutaneous TID WC Clanford L Johnson, MD      . ipratropium-albuterol (DUONEB) 0.5-2.5 (3) MG/3ML nebulizer solution 3 mL  3 mL Nebulization Q6H PRN [name], MD      . levothyroxine (SYNTHROID, LEVOTHROID) tablet 200 mcg  200 mcg Oral QAC breakfast [name], MD   200 mcg at 04/06/16 [id number]   And  . levothyroxine (SYNTHROID, LEVOTHROID) tablet 25 mcg  25 mcg Oral QAC breakfast [name], MD   25 mcg at 04/06/16 [id number]  . omega-3 acid ethyl esters (LOVAZA) capsule 1  g  1 capsule Oral BID [name], MD   1 g at 04/05/16 2050  . oxyCODONE-acetaminophen (PERCOCET/ROXICET) 5-325 MG per tablet 2 tablet  2 tablet Oral Q6H PRN Clanford Marisa Hua, MD   2 tablet at 04/05/16 2050  . pantoprazole (PROTONIX) EC tablet 40 mg  40 mg Oral BID AC Clanford Marisa Hua, MD   40 mg at 04/06/16 [id number]  .  QUEtiapine (SEROQUEL) tablet 50 mg  50 mg Oral QHS [name], MD   50 mg at 04/05/16 2050  . simethicone (MYLICON) chewable tablet 80 mg  80 mg Oral Q8H PRN [name], MD      . sodium chloride flush (NS) 0.9 % injection 3 mL  3 mL Intravenous Q12H [name], MD   3 mL at 04/05/16 2051  . sulfamethoxazole-trimethoprim (BACTRIM) 603.2 mg in dextrose 5 % 500 mL IVPB  5 mg/kg Intravenous Q8H Clanford L Johnson, MD   603.2 mg at 04/06/16 0300  . topiramate (TOPAMAX) tablet 50 mg  50 mg Oral BID [name], MD   50 mg at 04/05/16 2050     Discharge Medications: Please see discharge summary for a list of discharge medications.  Relevant Imaging Results:  Relevant Lab Results:   Additional Information    Salome Arnt, [place]

## 2016-04-06 NOTE — Discharge Summary (Signed)
Physician Discharge Summary  [name] [id number] DOB: 1945-08-22 DOA: 04/03/2016  PCP: [name], MD  Admit date: 04/03/2016 Discharge date: 04/06/2016  Admitted From: SNF Disposition:  SNF  Recommendations for Outpatient Follow-up:  1. Follow up with PCP in 1-2 weeks 2. Please obtain BMP/CBC in one week 3. Follow blood sugars, patient started on metformin 4. Irrigate foley catheter BID with 39mL acetic acid, 0.25%, administering it through a catheter tip syringe, in/out, agitating it a couple of times, rather than just placing the acetic acid in the bladder and letting it drain out passively 5. Repeat thyroid studies in 4 weeks. Current TSH 14  Home Health: Equipment/Devices:  Discharge Condition: stable CODE STATUS: full Diet recommendation: Heart Healthy / Carb Modified   Brief/Interim Summary: [name] an 71 y.o.femalewith hx of lower extremity paralysis, COPD, Bipolar disorder, chronic foley, HTN, HLD, presented to the ER as she has cellulitis of the left thigh for a few days, along with a non productive coughs. She was found to also has a UTI, and due to her various antibiotic allergies, she was started on Cipro. The area of redness did not recede, thus she was sent into the ER. She denied pain, fever, chills, SOB, CP, or any other symptomology. She said her foley often becomes cloudy, and her urethra has been "blocked" frequently causing some leakage  Discharge Diagnoses:  Principal Problem:   Cellulitis Active Problems:   Diabetes mellitus without complication (HCC)   Pyelonephritis   GERD (gastroesophageal reflux disease)   UTI (urinary tract infection)   Essential hypertension   Hypokalemia   Hyperglycemia  Cellulitis of LLE - Improving clinically.  Patient was treated with IV bactrim and has started to improve. Will transition to oral bactrim to complete her course. Margins of cellulitis have been demarcated. She has been afebrile and  leukocytosis has resolved.     Uncontrolled diabetes mellitus - A1c 7.1%, This is new diagnosis, pt reports no prior history of DM. She was treated with sliding scale insulin. She will be discharged on oral metformin and should continue with glucose checks.     Hypothyroidism - TSH markedly elevated. Normal free T4. Continue levothyroxine. Repeat thyroid studies in 4 weeks.  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 She was not felt to be a candidate for suprapubic catheter. It was recommended that patient's catheter be irrigated with acetic acid BID.   GERD - continue current medications. Stable.  Bipolar disease - Continue home medications. Stable.   Essential Hypertension - continue home medications. Stable.   URI - pt reports that symptoms are improving.  Suspect viral URI. Continue supportive treatments  Discharge Instructions  Discharge Instructions    Diet - low sodium heart healthy    Complete by:  As directed    Increase activity slowly    Complete by:  As directed      Allergies as of 04/06/2016      Reactions   Invanz [ertapenem] Other (See Comments)   Patient states allergy but is not certain    Propranolol Hcl    Rocephin [ceftriaxone] Hives   Vancomycin Hives      Medication List    STOP taking these medications   ciprofloxacin 500 MG tablet Commonly known as:  CIPRO   tiZANidine 4 MG tablet Commonly known as:  ZANAFLEX   traMADol 50 MG tablet Commonly  known as:  ULTRAM     TAKE these medications   acetaminophen 325 MG tablet Commonly known as:  TYLENOL Take 325 mg by mouth 2 (two) times daily. *May take one every 6 hours as needed for pain   acetic acid 0.25 % irrigation Irrigate with as directed 2 (two) times daily.   alendronate 70 MG tablet Commonly known as:  FOSAMAX Take 70 mg by mouth every  Monday. Take with a full glass of water on an empty stomach.   allopurinol 100 MG tablet Commonly known as:  ZYLOPRIM Take 100 mg by mouth daily.   alum & mag hydroxide-simeth 200-200-20 MG/5ML suspension Commonly known as:  MAALOX/MYLANTA Take 20 mLs by mouth every 4 (four) hours as needed for indigestion or heartburn.   aspirin EC 81 MG tablet Take 81 mg by mouth daily.   b complex vitamins tablet Take 1 tablet by mouth daily.   buPROPion 300 MG 24 hr tablet Commonly known as:  WELLBUTRIN XL Take 300 mg by mouth daily.   calcium carbonate 500 MG chewable tablet Commonly known as:  TUMS - dosed in mg elemental calcium Chew 2 tablets by mouth every 6 (six) hours as needed for indigestion or heartburn.   calcium citrate-vitamin D 500-400 MG-UNIT chewable tablet Chew 1 tablet by mouth 2 (two) times daily.   carvedilol 12.5 MG tablet Commonly known as:  COREG Take 12.5 mg by mouth 2 (two) times daily with a meal.   cetirizine 10 MG tablet Commonly known as:  ZYRTEC Take 10 mg by mouth daily.   cholecalciferol 1000 units tablet Commonly known as:  VITAMIN D Take 1,000 Units by mouth daily.   colchicine 0.6 MG tablet Take 0.6 mg by mouth 2 (two) times daily.   Cranberry 475 MG Caps Take 1 capsule by mouth daily.   dextromethorphan-guaiFENesin 30-600 MG 12hr tablet Commonly known as:  MUCINEX DM Take 1 tablet by mouth every 12 (twelve) hours as needed for cough.   diazepam 5 MG tablet Commonly known as:  VALIUM Take 5 mg by mouth every 6 (six) hours as needed for muscle spasms (itching/moderate pain).   diphenhydrAMINE 12.5 MG chewable tablet Commonly known as:  BENADRYL Chew 12.5 mg by mouth 4 (four) times daily as needed for allergies.   docusate sodium 100 MG capsule Commonly known as:  COLACE Take 100 mg by mouth every 12 (twelve) hours as needed for mild constipation.   ezetimibe 10 MG tablet Commonly known as:  ZETIA Take 10 mg by mouth daily.    fluticasone 50 MCG/ACT nasal spray Commonly known as:  FLONASE Place 2 sprays into both nostrils daily.   furosemide 40 MG tablet Commonly known as:  LASIX Take 40 mg by mouth daily.   gabapentin 300 MG capsule Commonly known as:  NEURONTIN Take 300 mg by mouth 3 (three) times daily.   guaifenesin 100 MG/5ML syrup Commonly known as:  ROBITUSSIN Take 100 mg by mouth every 4 (four) hours as needed for cough.   ipratropium-albuterol 0.5-2.5 (3) MG/3ML Soln Commonly known as:  DUONEB Take 3 mLs by nebulization every 6 (six) hours as needed (shortness of breath).   Krill Oil 300 MG Caps Take 1 capsule by mouth 2 (two) times daily.   levothyroxine 200 MCG tablet Commonly known as:  SYNTHROID, LEVOTHROID Take 200 mcg by mouth daily. Takes with Synthroid (Levothyroxine) 25 mcg for a total of 268mcg daily at 6:00am   magnesium oxide 400 MG tablet Commonly known as:  MAG-OX Take 400 mg by mouth 2 (two) times daily.   metFORMIN 500 MG tablet Commonly known as:  GLUCOPHAGE Take 1 tablet (500 mg total) by mouth daily with breakfast.   montelukast 10 MG tablet Commonly known as:  SINGULAIR Take 10 mg by mouth daily.   nystatin ointment Commonly known as:  MYCOSTATIN Apply 1 application topically every 8 (eight) hours as needed. Applied to the back of legs   omeprazole 40 MG capsule Commonly known as:  PRILOSEC Take 40 mg by mouth 2 (two) times daily before a meal.   oxyCODONE-acetaminophen 5-325 MG tablet Commonly known as:  PERCOCET/ROXICET Take 1 tablet by mouth 2 (two) times daily.   POLY-IRON 150 150 MG capsule Generic drug:  iron polysaccharides Take 150 mg by mouth daily.   Potassium Chloride ER 20 MEQ Tbcr Take 10 mEq by mouth daily.   promethazine 12.5 MG tablet Commonly known as:  PHENERGAN Take 12.5 mg by mouth every 6 (six) hours as needed for nausea or vomiting.   PROVENTIL HFA 108 (90 Base) MCG/ACT inhaler Generic drug:  albuterol Inhale 2 puffs into  the lungs every 8 (eight) hours as needed (Asthma).   pseudoephedrine 30 MG tablet Commonly known as:  SUDAFED Take 30 mg by mouth every 8 (eight) hours as needed for congestion.   QUEtiapine 50 MG tablet Commonly known as:  SEROQUEL Take 50 mg by mouth at bedtime.   rizatriptan 10 MG tablet Commonly known as:  MAXALT Take 1 tablet by mouth every 8 (eight) hours as needed for migraine.   sennosides-docusate sodium 8.6-50 MG tablet Commonly known as:  SENOKOT-S Take 1 tablet by mouth daily as needed for constipation.   simethicone 80 MG chewable tablet Commonly known as:  MYLICON Chew 80 mg by mouth every 8 (eight) hours as needed for flatulence.   sulfamethoxazole-trimethoprim 800-160 MG tablet Commonly known as:  BACTRIM DS,SEPTRA DS Take 1 tablet by mouth 2 (two) times daily. For 5 more days   topiramate 50 MG tablet Commonly known as:  TOPAMAX Take 50 mg by mouth 2 (two) times daily.   vitamin C 500 MG tablet Commonly known as:  ASCORBIC ACID Take 500 mg by mouth 2 (two) times daily.       Allergies  Allergen Reactions  . Invanz [Ertapenem] Other (See Comments)    Patient states allergy but is not certain   . Propranolol Hcl   . Rocephin [Ceftriaxone] Hives  . Vancomycin Hives    Consultations:  urology   Procedures/Studies: Dg Chest Portable 1 View  Result Date: 04/03/2016 CLINICAL DATA:  Cough and congestion EXAM: PORTABLE CHEST 1 VIEW COMPARISON:  September 08, 2014 FINDINGS: There is mild atelectasis in the right lower lung zone. Lungs elsewhere are clear. Heart size and pulmonary vascularity are normal. No adenopathy. There is a degree of acromioclavicular separation on the right. IMPRESSION: Mild right base atelectasis. No edema or consolidation. Right-sided acromioclavicular separation. Electronically Signed   By: Lowella Grip III M.D.   On: 04/03/2016 22:17      Subjective: Feeling better. No new complaints  Discharge Exam: Vitals:   04/05/16  2124 04/06/16 0610  BP: (!) 105/54 (!) 108/59  Pulse: 72 76  Resp: 18 18  Temp: 98.5 F (36.9 C) 98.2 F (36.8 C)   Vitals:   04/05/16 1636 04/05/16 2058 04/05/16 2124 04/06/16 0610  BP: (!) 102/57  (!) 105/54 (!) 108/59  Pulse: 79  72 76  Resp: 18  18 18  Temp: 98.1 F (36.7 C)  98.5 F (36.9 C) 98.2 F (36.8 C)  TempSrc: Oral  Oral Oral  SpO2: 98% 96% 98% 96%  Weight:      Height:        General: Pt is alert, awake, not in acute distress Cardiovascular: RRR, S1/S2 +, no rubs, no gallops Respiratory: CTA bilaterally, no wheezing, no rhonchi Abdominal: Soft, NT, ND, bowel sounds + Extremities: erythema over left lateral thigh/hip has not progressed passed margins    The results of significant diagnostics from this hospitalization (including imaging, microbiology, ancillary and laboratory) are listed below for reference.     Microbiology: Recent Results (from the past 240 hour(s))  Urine culture     Status: Abnormal   Collection Time: 04/03/16 10:18 PM  Result Value Ref Range Status   Specimen Description URINE, RANDOM  Final   Special Requests NONE  Final   Culture MULTIPLE SPECIES PRESENT, SUGGEST RECOLLECTION (A)  Final   Report Status 04/06/2016 FINAL  Final  MRSA PCR Screening     Status: None   Collection Time: 04/04/16  1:34 AM  Result Value Ref Range Status   MRSA by PCR NEGATIVE NEGATIVE Final    Comment:        The GeneXpert MRSA Assay (FDA approved for NASAL specimens only), is one component of a comprehensive MRSA colonization surveillance program. It is not intended to diagnose MRSA infection nor to guide or monitor treatment for MRSA infections.      Labs: BNP (last 3 results) No results for input(s): BNP in the last 8760 hours. Basic Metabolic Panel:  Recent Labs Lab 04/03/16 2133 04/04/16 0552 04/05/16 0606  NA 136 134* 134*  K 3.4* 2.7* 3.7  CL 100* 101 102  CO2 27 25 25   GLUCOSE 208* 243* 136*  BUN 20 15 12   CREATININE 0.73  0.56 0.61  CALCIUM 8.8* 8.2* 8.1*  MG  --  1.5* 1.6*   Liver Function Tests:  Recent Labs Lab 04/03/16 2133  AST 36  ALT 31  ALKPHOS 96  BILITOT 0.6  PROT 6.9  ALBUMIN 3.6   No results for input(s): LIPASE, AMYLASE in the last 168 hours. No results for input(s): AMMONIA in the last 168 hours. CBC:  Recent Labs Lab 04/03/16 2133 04/04/16 0552 04/05/16 0606  WBC 12.5* 11.6* 8.6  NEUTROABS 9.2*  --   --   HGB 12.9 11.9* 10.8*  HCT 42.2 38.1 33.8*  MCV 90.0 89.6 87.8  PLT 187 164 154   Cardiac Enzymes: No results for input(s): CKTOTAL, CKMB, CKMBINDEX, TROPONINI in the last 168 hours. BNP: Invalid input(s): POCBNP CBG:  Recent Labs Lab 04/05/16 2118 04/06/16 0740  GLUCAP 183* 108*   D-Dimer No results for input(s): DDIMER in the last 72 hours. Hgb A1c  Recent Labs  04/04/16 0552  HGBA1C 7.1*   Lipid Profile No results for input(s): CHOL, HDL, LDLCALC, TRIG, CHOLHDL, LDLDIRECT in the last 72 hours. Thyroid function studies  Recent Labs  04/04/16 0552  TSH 14.509*   Anemia work up No results for input(s): VITAMINB12, FOLATE, FERRITIN, TIBC, IRON, RETICCTPCT in the last 72 hours. Urinalysis    Component Value Date/Time   COLORURINE AMBER 04/03/2016 2218   APPEARANCEUR TURBID 04/03/2016 2218   LABSPEC 1.011 04/03/2016 2218   PHURINE 8.0 04/03/2016 2218   GLUCOSEU NEGATIVE 04/03/2016 2218   HGBUR SMALL (A) 04/03/2016 2218   BILIRUBINUR NEGATIVE 04/03/2016 2218   KETONESUR NEGATIVE 04/03/2016 2218   PROTEINUR 100 (  A) 04/03/2016 2218   UROBILINOGEN 0.2 09/08/2014 0436   NITRITE POSITIVE (A) 04/03/2016 2218   LEUKOCYTESUR LARGE (A) 04/03/2016 2218   Sepsis Labs Invalid input(s): PROCALCITONIN,  WBC,  LACTICIDVEN Microbiology Recent Results (from the past 240 hour(s))  Urine culture     Status: Abnormal   Collection Time: 04/03/16 10:18 PM  Result Value Ref Range Status   Specimen Description URINE, RANDOM  Final   Special Requests NONE   Final   Culture MULTIPLE SPECIES PRESENT, SUGGEST RECOLLECTION (A)  Final   Report Status 04/06/2016 FINAL  Final  MRSA PCR Screening     Status: None   Collection Time: 04/04/16  1:34 AM  Result Value Ref Range Status   MRSA by PCR NEGATIVE NEGATIVE Final    Comment:        The GeneXpert MRSA Assay (FDA approved for NASAL specimens only), is one component of a comprehensive MRSA colonization surveillance program. It is not intended to diagnose MRSA infection nor to guide or monitor treatment for MRSA infections.      Time coordinating discharge: Over 30 minutes  SIGNED:   Kathie Dike, MD  [facility] 04/06/2016, 11:15 AM Pager   If 7PM-7AM, please contact night-coverage [email or web address] Password [email or web address]

## 2016-04-06 NOTE — Progress Notes (Signed)
Patient discharged to [facility].Report called,and given to [facility] LPN.Vital signs stable.Transported by [facility] to awaiting facility.

## 2016-04-06 NOTE — Clinical Social Work Note (Signed)
CSW notified that pt is from [facility]. Pt alert and oriented and states she has been a resident there for about 9 years. Pt is d/c today back to facility. Pt and facility aware and agreeable. Will transport via [employer]. Pt states no need to call any family regarding d/c.   Benay Pike, [place]

## 2016-04-07 DIAGNOSIS — L0390 Cellulitis, unspecified: Secondary | ICD-10-CM | POA: Diagnosis not present

## 2016-04-07 DIAGNOSIS — E039 Hypothyroidism, unspecified: Secondary | ICD-10-CM | POA: Diagnosis not present

## 2016-04-07 DIAGNOSIS — K219 Gastro-esophageal reflux disease without esophagitis: Secondary | ICD-10-CM | POA: Diagnosis not present

## 2016-04-07 DIAGNOSIS — I10 Essential (primary) hypertension: Secondary | ICD-10-CM | POA: Diagnosis not present

## 2016-04-25 DIAGNOSIS — L0390 Cellulitis, unspecified: Secondary | ICD-10-CM | POA: Diagnosis not present

## 2016-04-25 DIAGNOSIS — F319 Bipolar disorder, unspecified: Secondary | ICD-10-CM | POA: Diagnosis not present

## 2016-04-25 DIAGNOSIS — F419 Anxiety disorder, unspecified: Secondary | ICD-10-CM | POA: Diagnosis not present

## 2016-04-25 DIAGNOSIS — F339 Major depressive disorder, recurrent, unspecified: Secondary | ICD-10-CM | POA: Diagnosis not present

## 2016-05-04 DIAGNOSIS — E039 Hypothyroidism, unspecified: Secondary | ICD-10-CM | POA: Diagnosis not present

## 2016-05-10 DIAGNOSIS — R319 Hematuria, unspecified: Secondary | ICD-10-CM | POA: Diagnosis not present

## 2016-05-10 DIAGNOSIS — N390 Urinary tract infection, site not specified: Secondary | ICD-10-CM | POA: Diagnosis not present

## 2016-05-16 DIAGNOSIS — N319 Neuromuscular dysfunction of bladder, unspecified: Secondary | ICD-10-CM | POA: Diagnosis not present

## 2016-05-16 DIAGNOSIS — N898 Other specified noninflammatory disorders of vagina: Secondary | ICD-10-CM | POA: Diagnosis not present

## 2016-05-16 DIAGNOSIS — N3289 Other specified disorders of bladder: Secondary | ICD-10-CM | POA: Diagnosis not present

## 2016-05-17 DIAGNOSIS — M79674 Pain in right toe(s): Secondary | ICD-10-CM | POA: Diagnosis not present

## 2016-05-17 DIAGNOSIS — B351 Tinea unguium: Secondary | ICD-10-CM | POA: Diagnosis not present

## 2016-06-09 DIAGNOSIS — R319 Hematuria, unspecified: Secondary | ICD-10-CM | POA: Diagnosis not present

## 2016-06-09 DIAGNOSIS — N319 Neuromuscular dysfunction of bladder, unspecified: Secondary | ICD-10-CM | POA: Diagnosis not present

## 2016-06-09 DIAGNOSIS — T7840XD Allergy, unspecified, subsequent encounter: Secondary | ICD-10-CM | POA: Diagnosis not present

## 2016-06-10 ENCOUNTER — Emergency Department (HOSPITAL_COMMUNITY)
Admission: EM | Admit: 2016-06-10 | Discharge: 2016-06-10 | Disposition: A | Discharge status: Home / Self Care | Payer: Medicare Other | Attending: Emergency Medicine | Admitting: Emergency Medicine

## 2016-06-10 ENCOUNTER — Encounter (HOSPITAL_COMMUNITY): Payer: Self-pay | Admitting: Emergency Medicine

## 2016-06-10 DIAGNOSIS — R319 Hematuria, unspecified: Secondary | ICD-10-CM | POA: Diagnosis not present

## 2016-06-10 DIAGNOSIS — J449 Chronic obstructive pulmonary disease, unspecified: Secondary | ICD-10-CM | POA: Diagnosis not present

## 2016-06-10 DIAGNOSIS — J45909 Unspecified asthma, uncomplicated: Secondary | ICD-10-CM | POA: Insufficient documentation

## 2016-06-10 DIAGNOSIS — E039 Hypothyroidism, unspecified: Secondary | ICD-10-CM | POA: Diagnosis not present

## 2016-06-10 DIAGNOSIS — I10 Essential (primary) hypertension: Secondary | ICD-10-CM | POA: Diagnosis not present

## 2016-06-10 DIAGNOSIS — Z87891 Personal history of nicotine dependence: Secondary | ICD-10-CM | POA: Diagnosis not present

## 2016-06-10 DIAGNOSIS — Y733 Surgical instruments, materials and gastroenterology and urology devices (including sutures) associated with adverse incidents: Secondary | ICD-10-CM | POA: Diagnosis not present

## 2016-06-10 DIAGNOSIS — Z7984 Long term (current) use of oral hypoglycemic drugs: Secondary | ICD-10-CM | POA: Insufficient documentation

## 2016-06-10 DIAGNOSIS — Z743 Need for continuous supervision: Secondary | ICD-10-CM | POA: Diagnosis not present

## 2016-06-10 DIAGNOSIS — T82898A Other specified complication of vascular prosthetic devices, implants and grafts, initial encounter: Secondary | ICD-10-CM | POA: Diagnosis not present

## 2016-06-10 DIAGNOSIS — Z79899 Other long term (current) drug therapy: Secondary | ICD-10-CM | POA: Diagnosis not present

## 2016-06-10 DIAGNOSIS — T83518A Infection and inflammatory reaction due to other urinary catheter, initial encounter: Secondary | ICD-10-CM | POA: Insufficient documentation

## 2016-06-10 DIAGNOSIS — Z7982 Long term (current) use of aspirin: Secondary | ICD-10-CM | POA: Insufficient documentation

## 2016-06-10 DIAGNOSIS — T83511A Infection and inflammatory reaction due to indwelling urethral catheter, initial encounter: Secondary | ICD-10-CM

## 2016-06-10 DIAGNOSIS — E119 Type 2 diabetes mellitus without complications: Secondary | ICD-10-CM | POA: Insufficient documentation

## 2016-06-10 DIAGNOSIS — M109 Gout, unspecified: Secondary | ICD-10-CM | POA: Diagnosis not present

## 2016-06-10 DIAGNOSIS — T83498A Other mechanical complication of other prosthetic devices, implants and grafts of genital tract, initial encounter: Secondary | ICD-10-CM | POA: Diagnosis not present

## 2016-06-10 DIAGNOSIS — D649 Anemia, unspecified: Secondary | ICD-10-CM | POA: Diagnosis not present

## 2016-06-10 DIAGNOSIS — N390 Urinary tract infection, site not specified: Secondary | ICD-10-CM

## 2016-06-10 DIAGNOSIS — R279 Unspecified lack of coordination: Secondary | ICD-10-CM | POA: Diagnosis not present

## 2016-06-10 LAB — CBC WITH DIFFERENTIAL/PLATELET
Basophils Absolute: 0 10*3/uL (ref 0.0–0.1)
Basophils Relative: 0 %
Eosinophils Absolute: 0.2 10*3/uL (ref 0.0–0.7)
Eosinophils Relative: 2 %
HCT: 33.8 % — ABNORMAL LOW (ref 36.0–46.0)
Hemoglobin: 10.7 g/dL — ABNORMAL LOW (ref 12.0–15.0)
LYMPHS PCT: 26 %
Lymphs Abs: 2.2 10*3/uL (ref 0.7–4.0)
MCH: 28.6 pg (ref 26.0–34.0)
MCHC: 31.7 g/dL (ref 30.0–36.0)
MCV: 90.4 fL (ref 78.0–100.0)
MONO ABS: 0.7 10*3/uL (ref 0.1–1.0)
MONOS PCT: 8 %
NEUTROS ABS: 5.4 10*3/uL (ref 1.7–7.7)
Neutrophils Relative %: 64 %
Platelets: 171 10*3/uL (ref 150–400)
RBC: 3.74 MIL/uL — ABNORMAL LOW (ref 3.87–5.11)
RDW: 15.4 % (ref 11.5–15.5)
WBC: 8.4 10*3/uL (ref 4.0–10.5)

## 2016-06-10 LAB — COMPREHENSIVE METABOLIC PANEL
ALK PHOS: 80 U/L (ref 38–126)
ALT: 28 U/L (ref 14–54)
ANION GAP: 7 (ref 5–15)
AST: 41 U/L (ref 15–41)
Albumin: 3.3 g/dL — ABNORMAL LOW (ref 3.5–5.0)
BUN: 17 mg/dL (ref 6–20)
CO2: 29 mmol/L (ref 22–32)
Calcium: 8.9 mg/dL (ref 8.9–10.3)
Chloride: 103 mmol/L (ref 101–111)
Creatinine, Ser: 0.5 mg/dL (ref 0.44–1.00)
GFR calc Af Amer: >60 mL/min (ref >60)
GFR calc non Af Amer: >60 mL/min (ref >60)
GLUCOSE: 107 mg/dL — AB (ref 65–99)
POTASSIUM: 4.1 mmol/L (ref 3.5–5.1)
SODIUM: 139 mmol/L (ref 135–145)
Total Bilirubin: 0.6 mg/dL (ref 0.3–1.2)
Total Protein: 6.3 g/dL — ABNORMAL LOW (ref 6.5–8.1)

## 2016-06-10 LAB — URINALYSIS, ROUTINE W REFLEX MICROSCOPIC
BILIRUBIN URINE: NEGATIVE
Glucose, UA: 50 mg/dL — AB
KETONES UR: NEGATIVE mg/dL
PH: 7 (ref 5.0–8.0)
PROTEIN: NEGATIVE mg/dL
Specific Gravity, Urine: 1.005 (ref 1.005–1.030)

## 2016-06-10 LAB — URINALYSIS, MICROSCOPIC (REFLEX): Squamous Epithelial / LPF: NONE SEEN

## 2016-06-10 MED ORDER — SULFAMETHOXAZOLE-TRIMETHOPRIM 800-160 MG PO TABS: 1.0000 | ORAL_TABLET | Freq: Two times a day (BID) | ORAL | 0 refills | Status: DC

## 2016-06-10 MED ORDER — SULFAMETHOXAZOLE-TRIMETHOPRIM 800-160 MG PO TABS
1.0000 | ORAL_TABLET | Freq: Once | ORAL | Status: AC
  Administered 2016-06-10: 1 via ORAL
  Filled 2016-06-10: qty 1

## 2016-06-10 NOTE — ED Triage Notes (Signed)
Pt reports blood in urine x 3 days. Pt had foley catheter removed about an hour pta. Pt reports she has been bleeding from her urethra since foley was removed. Reports hysterectomy in 1985. Pt is paralyzed from [employer] down.

## 2016-06-10 NOTE — ED Provider Notes (Signed)
[facility] DEPT Provider Note   CSN: [id number] Arrival date & time: 06/10/16  1147     History   Chief Complaint Chief Complaint  Patient presents with  . Hematuria    HPI [name] is a 71 y.o. female.  HPI  Patient with multiple medical issues including paraplegia, chronic indwelling Foley catheter presents with concern of hematuria. Patient has had episodes of hematuria in the past, though none as severe. She notes over the past 3 days she has noticed persistent hematuria No clear precipitant. Today, with ongoing, possibly worsening bleeding, patient had removal of her Foley catheter, was sent here for evaluation. She does not know which urologist she has seen. She denies other new changes, including fever, lightheadedness, chills, nausea, vomiting.   Past Medical History:  Diagnosis Date  . Bipolar 1 disorder ([place])   . COPD with asthma ([place])   . Diabetes mellitus without complication ([place])   . GERD (gastroesophageal reflux disease)   . Hypertension   . Obesity   . 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 Pyelonephritis   . Thyroid disease     Patient Active Problem List   Diagnosis Date Noted  . Hypokalemia 04/04/2016  . Hyperglycemia 04/04/2016  . Cellulitis 04/03/2016  . Sepsis ([place]) 09/08/2014  . Chronic paraplegia ([place]) 09/08/2014  . Swelling of right lower extremity 07/21/2014  . Discitis of lumbar region   . Abscess in epidural space of lumbar spine   . Blood poisoning ([place])   . Osteomyelitis ([place])   . Discitis of thoracolumbar region   . Anxiety state   . Chronic pain syndrome   . Sepsis due to Candida species ([place])   . Spondylarthrosis   . Acute renal failure syndrome ([place])   . Other specified hypothyroidism   . Essential hypertension   . Paraplegia ([place])   . Enteritis due to Clostridium difficile 05/08/2014  . UTI (urinary tract infection) 05/08/2014  . Chronic indwelling Foley catheter 05/08/2014  . Hypotension   . Decubitus  ulcer of sacral region, unstageable ([place]) 03/28/2014  . Hypothyroid 03/28/2014  . Depression 03/28/2014  . ARF (acute renal failure) ([place]) 03/28/2014  . Acute respiratory failure ([place]) 03/27/2014  . Pyelonephritis 10/22/2013  . Leukocytosis 10/22/2013  . Hyponatremia 10/22/2013  . Encephalopathy acute 10/22/2013  . Morbid obesity ([place]) 10/22/2013  . Hypothyroidism 10/22/2013  . Hypertension   . Bipolar 1 disorder ([place])   . Thyroid disease   . Diabetes mellitus without complication ([place])   . GERD (gastroesophageal reflux disease)   . SHOULDER PAIN 11/19/2007  . HAND, ARTHRITIS, DEGEN./OSTEO 09/03/2007  . 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 CARPAL TUNNEL SYNDROME 05/16/2007    Past Surgical History:  Procedure Laterality Date  . ABDOMINAL HYSTERECTOMY    . BACK SURGERY      OB History    No data available       Home Medications    Prior to Admission medications   Medication Sig Start Date End Date Taking? Authorizing Provider  acetaminophen (TYLENOL) 325 MG tablet Take 325 mg by mouth 2 (two) times daily. *May take one every 6 hours as needed for pain   Yes Historical Provider, MD  acetic acid 0.25 % irrigation Irrigate with as directed 2 (two) times daily. 04/06/16  Yes Kathie Dike, MD  albuterol (PROVENTIL HFA) 108 (90 BASE) MCG/ACT inhaler Inhale 2 puffs into the lungs every 8 (eight) hours as needed (Asthma).    Yes Historical Provider, MD  allopurinol (ZYLOPRIM) 100 MG  tablet Take 100 mg by mouth daily.  10/14/14  Yes Historical Provider, MD  alum & mag hydroxide-simeth (MAALOX/MYLANTA) 200-200-20 MG/5ML suspension Take 20 mLs by mouth every 4 (four) hours as needed for indigestion or heartburn.    Yes Historical Provider, MD  aspirin EC 81 MG tablet Take 81 mg by mouth daily.   Yes Historical Provider, MD  b complex vitamins tablet Take 1 tablet by mouth daily.   Yes Historical Provider, MD  buPROPion (WELLBUTRIN XL) 300 MG 24 hr tablet Take 300 mg by mouth daily.    Yes  Historical Provider, MD  calcium carbonate (TUMS - DOSED IN MG ELEMENTAL CALCIUM) 500 MG chewable tablet Chew 2 tablets by mouth every 6 (six) hours as needed for indigestion or heartburn.   Yes Historical Provider, MD  calcium citrate-vitamin D 500-400 MG-UNIT Chew 1 tablet by mouth 2 (two) times daily.   Yes Historical Provider, MD  carvedilol (COREG) 12.5 MG tablet Take 12.5 mg by mouth 2 (two) times daily with a meal.   Yes Historical Provider, MD  cetirizine (ZYRTEC) 10 MG tablet Take 10 mg by mouth daily.   Yes Historical Provider, MD  cholecalciferol (VITAMIN D) 1000 UNITS tablet Take 1,000 Units by mouth daily.   Yes Historical Provider, MD  colchicine 0.6 MG tablet Take 0.6 mg by mouth 2 (two) times daily.   Yes Historical Provider, MD  Cranberry 475 MG CAPS Take 1 capsule by mouth daily.   Yes Historical Provider, MD  dextromethorphan-guaiFENesin (MUCINEX DM) 30-600 MG per 12 hr tablet Take 1 tablet by mouth every 12 (twelve) hours as needed for cough.    Yes Historical Provider, MD  diphenhydrAMINE (BENADRYL) 12.5 MG chewable tablet Chew 12.5 mg by mouth 4 (four) times daily as needed for allergies.   Yes Historical Provider, MD  docusate sodium (COLACE) 100 MG capsule Take 100 mg by mouth every 12 (twelve) hours as needed for mild constipation.   Yes Historical Provider, MD  ezetimibe (ZETIA) 10 MG tablet Take 10 mg by mouth at bedtime.    Yes Historical Provider, MD  fluticasone (FLONASE) 50 MCG/ACT nasal spray Place 2 sprays into both nostrils daily.  09/03/14  Yes Historical Provider, MD  furosemide (LASIX) 40 MG tablet Take 40 mg by mouth daily.   Yes Historical Provider, MD  gabapentin (NEURONTIN) 300 MG capsule Take 300 mg by mouth 3 (three) times daily.   Yes Historical Provider, MD  guaifenesin (ROBITUSSIN) 100 MG/5ML syrup Take 100 mg by mouth every 4 (four) hours as needed for cough.   Yes Historical Provider, MD  ipratropium-albuterol (DUONEB) 0.5-2.5 (3) MG/3ML SOLN Take 3 mLs  by nebulization every 6 (six) hours as needed (shortness of breath).    Yes Historical Provider, MD  iron polysaccharides (POLY-IRON 150) 150 MG capsule Take 150 mg by mouth daily.   Yes Historical Provider, MD  Javier Docker Oil 300 MG CAPS Take 1 capsule by mouth 2 (two) times daily.   Yes Historical Provider, MD  levothyroxine (SYNTHROID, LEVOTHROID) 200 MCG tablet Take 200 mcg by mouth daily. Takes with Synthroid (Levothyroxine) 25 mcg for a total of 21mcg daily at 6:00am   Yes Historical Provider, MD  LORazepam (ATIVAN) 0.5 MG tablet Take 0.5 mg by mouth 3 (three) times daily.   Yes Historical Provider, MD  magnesium oxide (MAG-OX) 400 MG tablet Take 400 mg by mouth 2 (two) times daily.   Yes Historical Provider, MD  metFORMIN (GLUCOPHAGE) 500 MG tablet Take 1 tablet (  500 mg total) by mouth daily with breakfast. 04/06/16  Yes Kathie Dike, MD  montelukast (SINGULAIR) 10 MG tablet Take 10 mg by mouth daily.   Yes Historical Provider, MD  omeprazole (PRILOSEC) 40 MG capsule Take 40 mg by mouth 2 (two) times daily before a meal.    Yes Historical Provider, MD  oxyCODONE-acetaminophen (PERCOCET/ROXICET) 5-325 MG tablet Take 1 tablet by mouth 2 (two) times daily.   Yes Historical Provider, MD  potassium chloride 20 MEQ TBCR Take 10 mEq by mouth daily. 04/06/16  Yes Kathie Dike, MD  promethazine (PHENERGAN) 12.5 MG tablet Take 12.5 mg by mouth every 6 (six) hours as needed for nausea or vomiting.   Yes Historical Provider, MD  pseudoephedrine (SUDAFED) 30 MG tablet Take 30 mg by mouth every 8 (eight) hours as needed for congestion.   Yes Historical Provider, MD  QUEtiapine (SEROQUEL) 50 MG tablet Take 50 mg by mouth at bedtime.   Yes Historical Provider, MD  rizatriptan (MAXALT) 10 MG tablet Take 1 tablet by mouth every 8 (eight) hours as needed for migraine.  09/02/14  Yes Historical Provider, MD  sennosides-docusate sodium (SENOKOT-S) 8.6-50 MG tablet Take 1 tablet by mouth daily as needed for  constipation.    Yes Historical Provider, MD  simethicone (MYLICON) 80 MG chewable tablet Chew 80 mg by mouth every 8 (eight) hours as needed for flatulence.   Yes Historical Provider, MD  tiZANidine (ZANAFLEX) 2 MG tablet Take 2 mg by mouth 3 (three) times daily.   Yes Historical Provider, MD  topiramate (TOPAMAX) 50 MG tablet Take 50 mg by mouth 2 (two) times daily.   Yes Historical Provider, MD  traMADol (ULTRAM) 50 MG tablet Take 50 mg by mouth every 6 (six) hours as needed.   Yes Historical Provider, MD  vitamin C (ASCORBIC ACID) 500 MG tablet Take 500 mg by mouth 2 (two) times daily.   Yes Historical Provider, MD  nystatin ointment (MYCOSTATIN) Apply 1 application topically every 8 (eight) hours as needed. Applied to the back of legs    Historical Provider, MD  sulfamethoxazole-trimethoprim (BACTRIM DS,SEPTRA DS) 800-160 MG tablet Take 1 tablet by mouth 2 (two) times daily. For 5 more days Patient not taking: Reported on 06/10/2016 04/06/16   Kathie Dike, MD    Family History Family History  Problem Relation Age of Onset  . Adopted: Yes  . Heart disease Mother     Social History Social History  Substance Use Topics  . Smoking status: Former Research scientist (life sciences)  . Smokeless tobacco: Never Used  . Alcohol use No     Allergies   Invanz [ertapenem]; Propranolol hcl; Rocephin [ceftriaxone]; and Vancomycin   Review of Systems Review of Systems  Constitutional:       Per HPI, otherwise negative  HENT:       Per HPI, otherwise negative  Respiratory:       Per HPI, otherwise negative  Cardiovascular:       Per HPI, otherwise negative  Gastrointestinal: Negative for vomiting.  Endocrine:       Negative aside from HPI  Genitourinary:       Neg aside from HPI   Musculoskeletal:       Per HPI, otherwise negative  Skin: Negative.   Neurological: Negative for syncope.       Paraplegia for >75yr     Physical Exam Updated Vital Signs BP 114/64   Pulse 64   Temp 97.9 F (36.6 C)  (Oral)   Resp  18   Ht 5\' 7"  (1.702 m)   Wt 265 lb (120.2 kg)   SpO2 99%   BMI 41.50 kg/m   Physical Exam  Constitutional: She is oriented to person, place, and time. She appears well-developed and well-nourished. No distress.  Obese, awake and alert elderly F speaking clearly  HENT:  Head: Normocephalic and atraumatic.  Eyes: Conjunctivae and EOM are normal.  Cardiovascular: Normal rate and regular rhythm.   Pulmonary/Chest: Effort normal and breath sounds normal. No stridor. No respiratory distress.  Abdominal: She exhibits no distension and no mass. There is no tenderness. There is no guarding.  Musculoskeletal: She exhibits no edema.  Neurological: She is alert and oriented to person, place, and time. No cranial nerve deficit.  LE paraplegia, no sensation.  CN unremarkable, UE strength and motion unremarkable  Skin: Skin is warm and dry.  Psychiatric: She has a normal mood and affect.  Nursing note and vitals reviewed.    ED Treatments / Results  Labs (all labs ordered are listed, but only abnormal results are displayed) Labs Reviewed  COMPREHENSIVE METABOLIC PANEL - Abnormal; Notable for the following:       Result Value   Glucose, Bld 107 (*)    Total Protein 6.3 (*)    Albumin 3.3 (*)    All other components within normal limits  CBC WITH DIFFERENTIAL/PLATELET - Abnormal; Notable for the following:    RBC 3.74 (*)    Hemoglobin 10.7 (*)    HCT 33.8 (*)    All other components within normal limits  URINALYSIS, ROUTINE W REFLEX MICROSCOPIC - Abnormal; Notable for the following:    Color, Urine RED (*)    APPearance CLOUDY (*)    Glucose, UA 50 (*)    Hgb urine dipstick MODERATE (*)    Nitrite PRESENT (*)    Leukocytes, UA SMALL (*)    All other components within normal limits  URINALYSIS, MICROSCOPIC (REFLEX) - Abnormal; Notable for the following:    Bacteria, UA FEW (*)    All other components within normal limits    Procedures Procedures (including  critical care time)  Medications Ordered in ED Medications  sulfamethoxazole-trimethoprim (BACTRIM DS,SEPTRA DS) 800-160 MG per tablet 1 tablet (1 tablet Oral Given 06/10/16 1453)     Initial Impression / Assessment and Plan / ED Course  I have reviewed the triage vital signs and the nursing notes.  Pertinent labs & imaging results that were available during my care of the patient were reviewed by me and considered in my medical decision making (see chart for details).  Exam the patient is in no distress, sleeping. She awakens easily. I reviewed all findings with her. Concern for urinary tract infection, patient will likely catheter, start antibiotics here. We discussed the need to follow-up with her urologist. Patient discharged in stable condition. Absent evidence for bacteremia, sepsis, no indication for admission, other imaging.   Final Clinical Impressions(s) / ED Diagnoses  Urinary tract infection, chronic indwelling catheter related   Carmin Muskrat, MD 06/10/16 1457

## 2016-06-10 NOTE — ED Notes (Signed)
Given frozen dinner meal and water

## 2016-06-10 NOTE — Discharge Instructions (Signed)
As discussed, with your diagnosis of urinary tract infection, and your ongoing bleeding, it is important that you monitor your condition carefully, and do not hesitate to return here for concerning changes.  Otherwise, please be sure to take all medication as directed, and follow-up with our urologists

## 2016-06-12 LAB — URINE CULTURE

## 2016-07-07 DIAGNOSIS — J45909 Unspecified asthma, uncomplicated: Secondary | ICD-10-CM | POA: Diagnosis not present

## 2016-07-07 DIAGNOSIS — G43119 Migraine with aura, intractable, without status migrainosus: Secondary | ICD-10-CM | POA: Diagnosis not present

## 2016-07-07 DIAGNOSIS — R51 Headache: Secondary | ICD-10-CM | POA: Diagnosis not present

## 2016-07-07 DIAGNOSIS — R509 Fever, unspecified: Secondary | ICD-10-CM | POA: Diagnosis not present

## 2016-07-08 DIAGNOSIS — R05 Cough: Secondary | ICD-10-CM | POA: Diagnosis not present

## 2016-07-08 DIAGNOSIS — E119 Type 2 diabetes mellitus without complications: Secondary | ICD-10-CM | POA: Diagnosis not present

## 2016-07-08 DIAGNOSIS — I10 Essential (primary) hypertension: Secondary | ICD-10-CM | POA: Diagnosis not present

## 2016-07-08 DIAGNOSIS — R4182 Altered mental status, unspecified: Secondary | ICD-10-CM | POA: Diagnosis not present

## 2016-07-08 DIAGNOSIS — N179 Acute kidney failure, unspecified: Secondary | ICD-10-CM | POA: Diagnosis not present

## 2016-07-08 DIAGNOSIS — Z87440 Personal history of urinary (tract) infections: Secondary | ICD-10-CM | POA: Diagnosis not present

## 2016-07-08 DIAGNOSIS — D649 Anemia, unspecified: Secondary | ICD-10-CM | POA: Diagnosis not present

## 2016-07-08 DIAGNOSIS — R509 Fever, unspecified: Secondary | ICD-10-CM | POA: Diagnosis not present

## 2016-07-08 DIAGNOSIS — Z79899 Other long term (current) drug therapy: Secondary | ICD-10-CM | POA: Diagnosis not present

## 2016-07-08 DIAGNOSIS — R319 Hematuria, unspecified: Secondary | ICD-10-CM | POA: Diagnosis not present

## 2016-07-08 DIAGNOSIS — N390 Urinary tract infection, site not specified: Secondary | ICD-10-CM | POA: Diagnosis not present

## 2016-07-08 DIAGNOSIS — R6521 Severe sepsis with septic shock: Secondary | ICD-10-CM | POA: Diagnosis not present

## 2016-07-11 DIAGNOSIS — E119 Type 2 diabetes mellitus without complications: Secondary | ICD-10-CM | POA: Diagnosis not present

## 2016-07-11 DIAGNOSIS — R509 Fever, unspecified: Secondary | ICD-10-CM | POA: Diagnosis not present

## 2016-07-11 DIAGNOSIS — Z87440 Personal history of urinary (tract) infections: Secondary | ICD-10-CM | POA: Diagnosis not present

## 2016-07-14 DIAGNOSIS — R1033 Periumbilical pain: Secondary | ICD-10-CM | POA: Diagnosis not present

## 2016-07-14 DIAGNOSIS — R197 Diarrhea, unspecified: Secondary | ICD-10-CM | POA: Diagnosis not present

## 2016-07-14 DIAGNOSIS — G43119 Migraine with aura, intractable, without status migrainosus: Secondary | ICD-10-CM | POA: Diagnosis not present

## 2016-07-14 DIAGNOSIS — R509 Fever, unspecified: Secondary | ICD-10-CM | POA: Diagnosis not present

## 2016-07-18 DIAGNOSIS — R6521 Severe sepsis with septic shock: Secondary | ICD-10-CM | POA: Diagnosis not present

## 2016-08-02 DIAGNOSIS — B351 Tinea unguium: Secondary | ICD-10-CM | POA: Diagnosis not present

## 2016-08-02 DIAGNOSIS — M79674 Pain in right toe(s): Secondary | ICD-10-CM | POA: Diagnosis not present

## 2016-08-22 DIAGNOSIS — R6889 Other general symptoms and signs: Secondary | ICD-10-CM | POA: Diagnosis not present

## 2016-08-22 DIAGNOSIS — R070 Pain in throat: Secondary | ICD-10-CM | POA: Diagnosis not present

## 2016-08-22 DIAGNOSIS — R05 Cough: Secondary | ICD-10-CM | POA: Diagnosis not present

## 2016-08-22 DIAGNOSIS — R51 Headache: Secondary | ICD-10-CM | POA: Diagnosis not present

## 2016-08-22 DIAGNOSIS — R062 Wheezing: Secondary | ICD-10-CM | POA: Diagnosis not present

## 2016-08-22 DIAGNOSIS — J209 Acute bronchitis, unspecified: Secondary | ICD-10-CM | POA: Diagnosis not present

## 2016-08-22 DIAGNOSIS — R6521 Severe sepsis with septic shock: Secondary | ICD-10-CM | POA: Diagnosis not present

## 2016-08-22 DIAGNOSIS — D649 Anemia, unspecified: Secondary | ICD-10-CM | POA: Diagnosis not present

## 2016-08-22 DIAGNOSIS — R0602 Shortness of breath: Secondary | ICD-10-CM | POA: Diagnosis not present

## 2016-08-29 DIAGNOSIS — Z79899 Other long term (current) drug therapy: Secondary | ICD-10-CM | POA: Diagnosis not present

## 2016-08-29 DIAGNOSIS — Z7951 Long term (current) use of inhaled steroids: Secondary | ICD-10-CM | POA: Diagnosis not present

## 2016-08-29 DIAGNOSIS — H2513 Age-related nuclear cataract, bilateral: Secondary | ICD-10-CM | POA: Diagnosis not present

## 2016-08-29 DIAGNOSIS — E119 Type 2 diabetes mellitus without complications: Secondary | ICD-10-CM | POA: Diagnosis not present

## 2016-08-30 DIAGNOSIS — R062 Wheezing: Secondary | ICD-10-CM | POA: Diagnosis not present

## 2016-08-30 DIAGNOSIS — J45909 Unspecified asthma, uncomplicated: Secondary | ICD-10-CM | POA: Diagnosis not present

## 2016-08-30 DIAGNOSIS — R51 Headache: Secondary | ICD-10-CM | POA: Diagnosis not present

## 2016-08-30 DIAGNOSIS — R070 Pain in throat: Secondary | ICD-10-CM | POA: Diagnosis not present

## 2016-09-09 LAB — DERMATOPATHOLOGY
Diagnosis Comment: ABSENT
Diagnosis Comment: ABSENT
Diagnosis Comment: ABSENT
Diagnosis Comment: ABSENT
Diagnosis Comment: ABSENT
ICD9 Code: 172.6

## 2016-09-20 NOTE — Progress Notes (Signed)
Left message with daughter, to call back.

## 2016-09-20 NOTE — Progress Notes (Signed)
Patient notified of results, voiced understanding.

## 2016-09-20 NOTE — Progress Notes (Signed)
Left message with daughter, to call back.

## 2016-09-20 NOTE — Addendum Note (Signed)
Addended by: Ainsley SpinnerNEUHAUS, Ansel Ferrall M on: 09/20/2016 09:11 PM     Modules accepted: Orders, Level of Service, SmartSet

## 2016-09-20 NOTE — Progress Notes (Signed)
Patient notified of results, voiced understanding. Path book updated (for both sites).

## 2016-09-20 NOTE — Progress Notes (Signed)
Subjective   Chief Complaint   Patient presents with    Skin Cancer     MELANOMA L ARM    Skin Concern     SPOT ON THE R LOWER BACK     History of Present Illness:  Priscilla Jarvis is a 71 y.o. female with HPI as follows:  Longstanding history of a pigmented lesion on the left upper arm.  Had been changing- getting bigger.  No pain or bleeding noted.  Biopsy performed by Dr. 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  Referred to [facility] for management of this skin cancer.    Also concerned with other spots on the chest and back. Tender spot on the back that rubs.  They have been present for several months/years.  No bleeding noted.      Personal Skin Cancer History   Melanoma Yes   Nonmelanoma skin cancer No     Details of Personal Skin Cancer Hx     Diagnosis Date Comment Source    Melanoma   Provider        Family Skin Cancer Hx     None        Patient's allergies, medications, past medical, surgical, family and social histories were reviewed and updated as appropriate.    Review of Systems   Constitutional: Negative.           Objective   Physical Examination:  Skin elements examined without findings:    Abdomen    R Upper Extremity  Skin elements examined with findings:    Chest & Axillae    Back    L Upper Extremity.       Additional Exam Findings    General Appearance negative: Well-appearing, well-nourished, with no obvious deformities.     Orientation negative: Oriented to time, place and person.     Mood & Affect negative: No evidence of depression, mania, anxiety, or agitation.       Data Reviewed:     Dermatopathology last 3 months:   Recent Dermatopathology  (Last 90 days)    Date/Time Component Value    08/17/16 0000 ICD9 Code 172.6    08/17/16 0000 Clinical Text CONSULT, MALIGNANT MELANOMA    08/17/16 0000 Final Diagnosis LEFT ARM MEDIAL ASPECT  MELANOMA, 0.3 MM THICKNESS, EXTENDING TO THE PERIPHERAL EDGES OF THE BIOPSY      08/17/16 0000 Diagnosis Comment The biopsy shows melanocytes irregularly  distributed as single cells and nests in confluence along the junction with multifocal pagetoid scatter, and a small dermal component. These features are highlighted on the provided Melan-A immunostain, and are   consistent with melanoma.      08/17/16 0000 Diagnosis Comment --    08/17/16 0000 Diagnosis Comment Diagnostic features of note include:    08/17/16 0000 Diagnosis Comment --    08/17/16 0000 Diagnosis Comment Procedure: shave biopsy    08/17/16 0000 Diagnosis Comment Tumor site: left arm medial aspect    08/17/16 0000 Diagnosis Comment Microscopic tumor type: melanoma, nos    08/17/16 0000 Diagnosis Comment Breslow thickness: 0.3 mm    08/17/16 0000 Diagnosis Comment Ulceration: absent    08/17/16 0000 Diagnosis Comment Margins, peripheral and deep: peripheral tissue edges are involved by melanoma in situ; deep margin is negative    08/17/16 0000 Diagnosis Comment Mitotic index: 0/mm2    08/17/16 0000 Diagnosis Comment Microsatellitosis: absent    08/17/16 0000 Diagnosis Comment Vascular invasion: absent    08/17/16 0000 Diagnosis Comment Perineural invasion:  absent    08/17/16 0000 Diagnosis Comment Regression: absent    08/17/16 0000 Diagnosis Comment Pathologic Staging (pTNM): pT1a    08/17/16 0000 Gross Text Three outside slides, [id number] 1A.    08/17/16 0000 Microscopic Description Sections show a broad proliferation of melanocytes.  Nests and single melanocytes are distributed at irregularly and confluently along the junctional zone.  There are several foci of melanocytes scattered above the basal layer. The dermis contains small   aggregates of similar cells and a surrounding lymphohistiocytic infiltrate with melanophages.  Solar elastosis is present.               Assessment   Assessment and Plan (numbered by problem):  1) Melanoma, T1ah 1 cm margins  Diagnosis reviewed  Recommend wide local excision wit  Techniques, benefits, alternatives, and risks discussed    2) Neoplasm of uncertain  behavior on skin, left forearm  Diagnostic biopsy today    3) Seborrheic keratosis, trunk  The diagnosis was reviewed with the patient.  Seborrheic keratoses are extremely common benign lesions that increase in number as patients get older.  They typically appear as brown, waxy, stuck on appearing lesions. They do not require treatment unless they have associated symptoms such as irritation or pruritus.        Melanoma Excision Procedure Note    Surgical excision was indicated for the treatment of a primary melanoma 0.3 mm in thickness located on the left upper arm.  The patient was identified per protocol, appropriate time out was performed, and the biopsy site was confirmed per site identification protocol, including confirmation with the patient. The technique, its benefits, alternatives, and risks (including but not limited to bleeding, infection, scar, incomplete excision, nerve injury, and cancer recurrence) were explained to the patient.  Informed consent was obtained.  The pre-operative size was 1.2 x 1.1 cm.      The patient was placed on the operative table. The lesion was viewed with Joseph Art lamp, marked, and injected with 1% buffered lidocaine with 1:200,000 epinephrine. The area was prepped with chlorhexidine and sterile drapes were placed. Excision was made around the visible tumor with 1.0 cm clinical margins down to fascia. Hemostasis was achieved using spot electrodesiccation.  The excised size was 3.2 x 3.1 cm.  There were no complications.  The specimen was placed in formalin and sent to pathology.    The attending was present and directly participated in the entire procedure.     Complex layered linear closure    The wound edge was trimmed to allow for proper wound approximation and closure. Due to limited tissue movement and difficulty in approximating and everting the wound edges of the defect, extensive and wide undermining was performed to reduce wound tension and facilitate a linear closure.   Hemostasis was achieved using spot electrodesiccation.  Buried suture placement in the deep subcutaneous tissue was oriented so that the closure fell along the patients natural skin tension lines.  Standing cones were removed to fat by triangulation.  Dermal apposition was achieved using 4-0 Vicryl. Final epidermal approximation was achieved using 4-0 Vicryl buried running subcuticular. The closure was manually tested and found to be sound for strength and hemostasis.  The final wound length was 10.7 cm.  There were no complications.    A pressure dressing was applied. Post-operative wound care instructions, along with a written handout, were reviewed with the patient.  The patient was discharged from the clinic alert and ambulatory.     The  attending was present and directly participated in the entire procedure.     Minor Procedure Notes (if applicable):  Shave biopsy: Consent was obtained for a diagnostic biopsy of 1 (qty) lesions located on the left forearm.  The area(s) were cleansed and anesthetized with 1% lidocaine with epi, and biopsied by tangential shave for diagnostic purposes.  Hemostasis was obtained with aluminum chloride  .  Vaseline and a bandage were applied, and biopsy site care was discussed.  The specimen(s) were sent for histopathologic evaluation. The attending was present and directly participated in the entire procedure.    Counseling:  Assessment and Plan were discussed.  Details of diagnosis and prognosis discussed  Diagnostic test results and their interpretation discussed  Risks and benefits of surgery discussed

## 2016-09-20 NOTE — Patient Instructions (Signed)
[place] [facility] PATIENT INSTRUCTIONS-   CARING FOR YOUR SKIN BIOPSY SITE    1. Your biopsy site may begin to drain fluid or bleed within the first few hours of the biopsy. If significant bleeding occurs that soaks the dressing or leaks from the dressing, remove the dressing and apply direct pressure to the bleeding site with clean gauze or dressing.  Keep constant pressure on the site for 15 minutes without removing the new dressing.  If bleeding continues after two 15-minute cycles of applied pressure, call the number below.   2. Change the band aid or dressing once daily. Each time you change the dressing, gently clean the   area with a Q-tip which has been moistened with hydrogen peroxide or with soap and water.   Gently apply a protective petrolatum ointment to the wound (Vaseline or Aquaphor). Cover the wound with a Band-aid or other dressing provided by your doctor. If the dressing gets wet, replace it with a dry one.   3. Avoid getting water on the biopsy site for the first 24 hours. After that, the biopsy site   can get wet for a short period of time (in the shower, for example). If you have stitches, your wound   should not be submerged in water until the stitches are removed.   4. If you have any problems or require additional information, call our office at [phone]. If it is after office hours, identify yourself as a dermatology patient, tell the operator the name of your dermatologist, and ask for the doctor on call to be paged. The doctor will call you back.     [place] [facility]MAT[facility] PATIENT INSTRUCTIONS -   CARING FOR YOUR SURGICAL WOUND SITE  1. Your surgical wound will be covered by a pressure dressing. Remove the pressure dressing after 24 hours.   2. If you have discomfort following surgery, take Tylenol as directed.   3. Rest. Avoid strenuous exercise, bending, straining, stooping or lifting heavy objects during the first few days after surgery. Activities that increase blood  pressure may cause bleeding at the wound site.   4. Do not get your wound wet for the first 24 hours.  After 24 hours, the wound can get wet in the shower, but it should not be submerged in water as long as the sutures are in.  5. You may apply an ice pack for 10-15 minutes of every hour while awake for the first day after surgery.  This can be applied directly over the pressure dressing and can help keep the swelling down.    6. Keeping your wound elevated for the first 24 hours can also help reduce swelling.  7. Your wound may begin to drain fluid or bleed within the first few hours of surgery. If significant bleeding occurs that soaks the dressing or leaks from the dressing, remove the dressing and apply direct pressure to the bleeding site with clean gauze or dressing.  Keep constant pressure on the site for 15 minutes without removing the new dressing.  If bleeding continues after two 15-minute cycles of applied pressure, call the number below.   8. Steri-strips were placed over the wound.  You can replace these only as needed.  You will NOT need to clean the wound with hydrogen peroxide or apply petrolatum if steri-strips are in place.  9. Follow-up as directed for suture removal or wound checks with your doctor.  10. If you have any problems or require additional information, call [phone].  If you need urgent assistance after hours, ask the operator to page the doctor on call.  The doctor will call you back.

## 2016-09-21 LAB — DERMATOPATHOLOGY
Diagnosis Comment: NOT DETECTED
Diagnosis Comment: NOT DETECTED
ICD9 Code: 172.6
ICD9 Code: 702.19

## 2016-10-03 DIAGNOSIS — K5900 Constipation, unspecified: Secondary | ICD-10-CM | POA: Diagnosis not present

## 2016-10-03 DIAGNOSIS — N319 Neuromuscular dysfunction of bladder, unspecified: Secondary | ICD-10-CM | POA: Diagnosis not present

## 2016-10-03 DIAGNOSIS — Z87440 Personal history of urinary (tract) infections: Secondary | ICD-10-CM | POA: Diagnosis not present

## 2016-10-03 DIAGNOSIS — F419 Anxiety disorder, unspecified: Secondary | ICD-10-CM | POA: Diagnosis not present

## 2016-10-04 DIAGNOSIS — N390 Urinary tract infection, site not specified: Secondary | ICD-10-CM | POA: Diagnosis not present

## 2016-10-04 DIAGNOSIS — Z79899 Other long term (current) drug therapy: Secondary | ICD-10-CM | POA: Diagnosis not present

## 2016-10-04 DIAGNOSIS — R319 Hematuria, unspecified: Secondary | ICD-10-CM | POA: Diagnosis not present

## 2016-10-06 DIAGNOSIS — K5900 Constipation, unspecified: Secondary | ICD-10-CM | POA: Diagnosis not present

## 2016-10-06 DIAGNOSIS — E669 Obesity, unspecified: Secondary | ICD-10-CM | POA: Diagnosis not present

## 2016-10-06 DIAGNOSIS — G8220 Paraplegia, unspecified: Secondary | ICD-10-CM | POA: Diagnosis not present

## 2016-10-06 DIAGNOSIS — N390 Urinary tract infection, site not specified: Secondary | ICD-10-CM | POA: Diagnosis not present

## 2016-10-07 DIAGNOSIS — R109 Unspecified abdominal pain: Secondary | ICD-10-CM | POA: Diagnosis not present

## 2016-10-08 DIAGNOSIS — N281 Cyst of kidney, acquired: Secondary | ICD-10-CM | POA: Diagnosis not present

## 2016-10-08 DIAGNOSIS — N200 Calculus of kidney: Secondary | ICD-10-CM | POA: Diagnosis not present

## 2016-10-10 DIAGNOSIS — N390 Urinary tract infection, site not specified: Secondary | ICD-10-CM | POA: Diagnosis not present

## 2016-10-10 DIAGNOSIS — E669 Obesity, unspecified: Secondary | ICD-10-CM | POA: Diagnosis not present

## 2016-10-10 DIAGNOSIS — K5900 Constipation, unspecified: Secondary | ICD-10-CM | POA: Diagnosis not present

## 2016-10-10 DIAGNOSIS — G8220 Paraplegia, unspecified: Secondary | ICD-10-CM | POA: Diagnosis not present

## 2016-10-11 DIAGNOSIS — N323 Diverticulum of bladder: Secondary | ICD-10-CM | POA: Diagnosis not present

## 2016-10-13 DIAGNOSIS — N390 Urinary tract infection, site not specified: Secondary | ICD-10-CM | POA: Diagnosis not present

## 2016-10-13 DIAGNOSIS — K5900 Constipation, unspecified: Secondary | ICD-10-CM | POA: Diagnosis not present

## 2016-10-13 DIAGNOSIS — E669 Obesity, unspecified: Secondary | ICD-10-CM | POA: Diagnosis not present

## 2016-10-13 DIAGNOSIS — N898 Other specified noninflammatory disorders of vagina: Secondary | ICD-10-CM | POA: Diagnosis not present

## 2016-10-20 DIAGNOSIS — E1151 Type 2 diabetes mellitus with diabetic peripheral angiopathy without gangrene: Secondary | ICD-10-CM | POA: Diagnosis not present

## 2016-10-24 DIAGNOSIS — D649 Anemia, unspecified: Secondary | ICD-10-CM | POA: Diagnosis not present

## 2016-10-24 DIAGNOSIS — I10 Essential (primary) hypertension: Secondary | ICD-10-CM | POA: Diagnosis not present

## 2016-11-04 DIAGNOSIS — N390 Urinary tract infection, site not specified: Secondary | ICD-10-CM | POA: Diagnosis not present

## 2016-11-04 DIAGNOSIS — I10 Essential (primary) hypertension: Secondary | ICD-10-CM | POA: Diagnosis not present

## 2016-11-04 DIAGNOSIS — D649 Anemia, unspecified: Secondary | ICD-10-CM | POA: Diagnosis not present

## 2016-11-04 DIAGNOSIS — L97829 Non-pressure chronic ulcer of other part of left lower leg with unspecified severity: Secondary | ICD-10-CM | POA: Diagnosis not present

## 2016-11-09 DIAGNOSIS — R319 Hematuria, unspecified: Secondary | ICD-10-CM | POA: Diagnosis not present

## 2016-11-09 DIAGNOSIS — Z79899 Other long term (current) drug therapy: Secondary | ICD-10-CM | POA: Diagnosis not present

## 2016-11-09 DIAGNOSIS — N390 Urinary tract infection, site not specified: Secondary | ICD-10-CM | POA: Diagnosis not present

## 2016-11-10 DIAGNOSIS — L97829 Non-pressure chronic ulcer of other part of left lower leg with unspecified severity: Secondary | ICD-10-CM | POA: Diagnosis not present

## 2016-11-14 DIAGNOSIS — L97829 Non-pressure chronic ulcer of other part of left lower leg with unspecified severity: Secondary | ICD-10-CM | POA: Diagnosis not present

## 2016-11-21 DIAGNOSIS — L97829 Non-pressure chronic ulcer of other part of left lower leg with unspecified severity: Secondary | ICD-10-CM | POA: Diagnosis not present

## 2016-11-23 DIAGNOSIS — N318 Other neuromuscular dysfunction of bladder: Secondary | ICD-10-CM | POA: Diagnosis not present

## 2016-12-09 DIAGNOSIS — L97829 Non-pressure chronic ulcer of other part of left lower leg with unspecified severity: Secondary | ICD-10-CM | POA: Diagnosis not present

## 2016-12-12 DIAGNOSIS — L97822 Non-pressure chronic ulcer of other part of left lower leg with fat layer exposed: Secondary | ICD-10-CM | POA: Diagnosis not present

## 2016-12-16 IMAGING — CR DG CHEST 1V PORT
1 series · 1 of 1 positions shown · non-contrast
Comparison: 10/22/2013

CLINICAL DATA: Loss of consciousness.  Unresponsive.

EXAM:
PORTABLE CHEST - 1 VIEW

[ap portable]
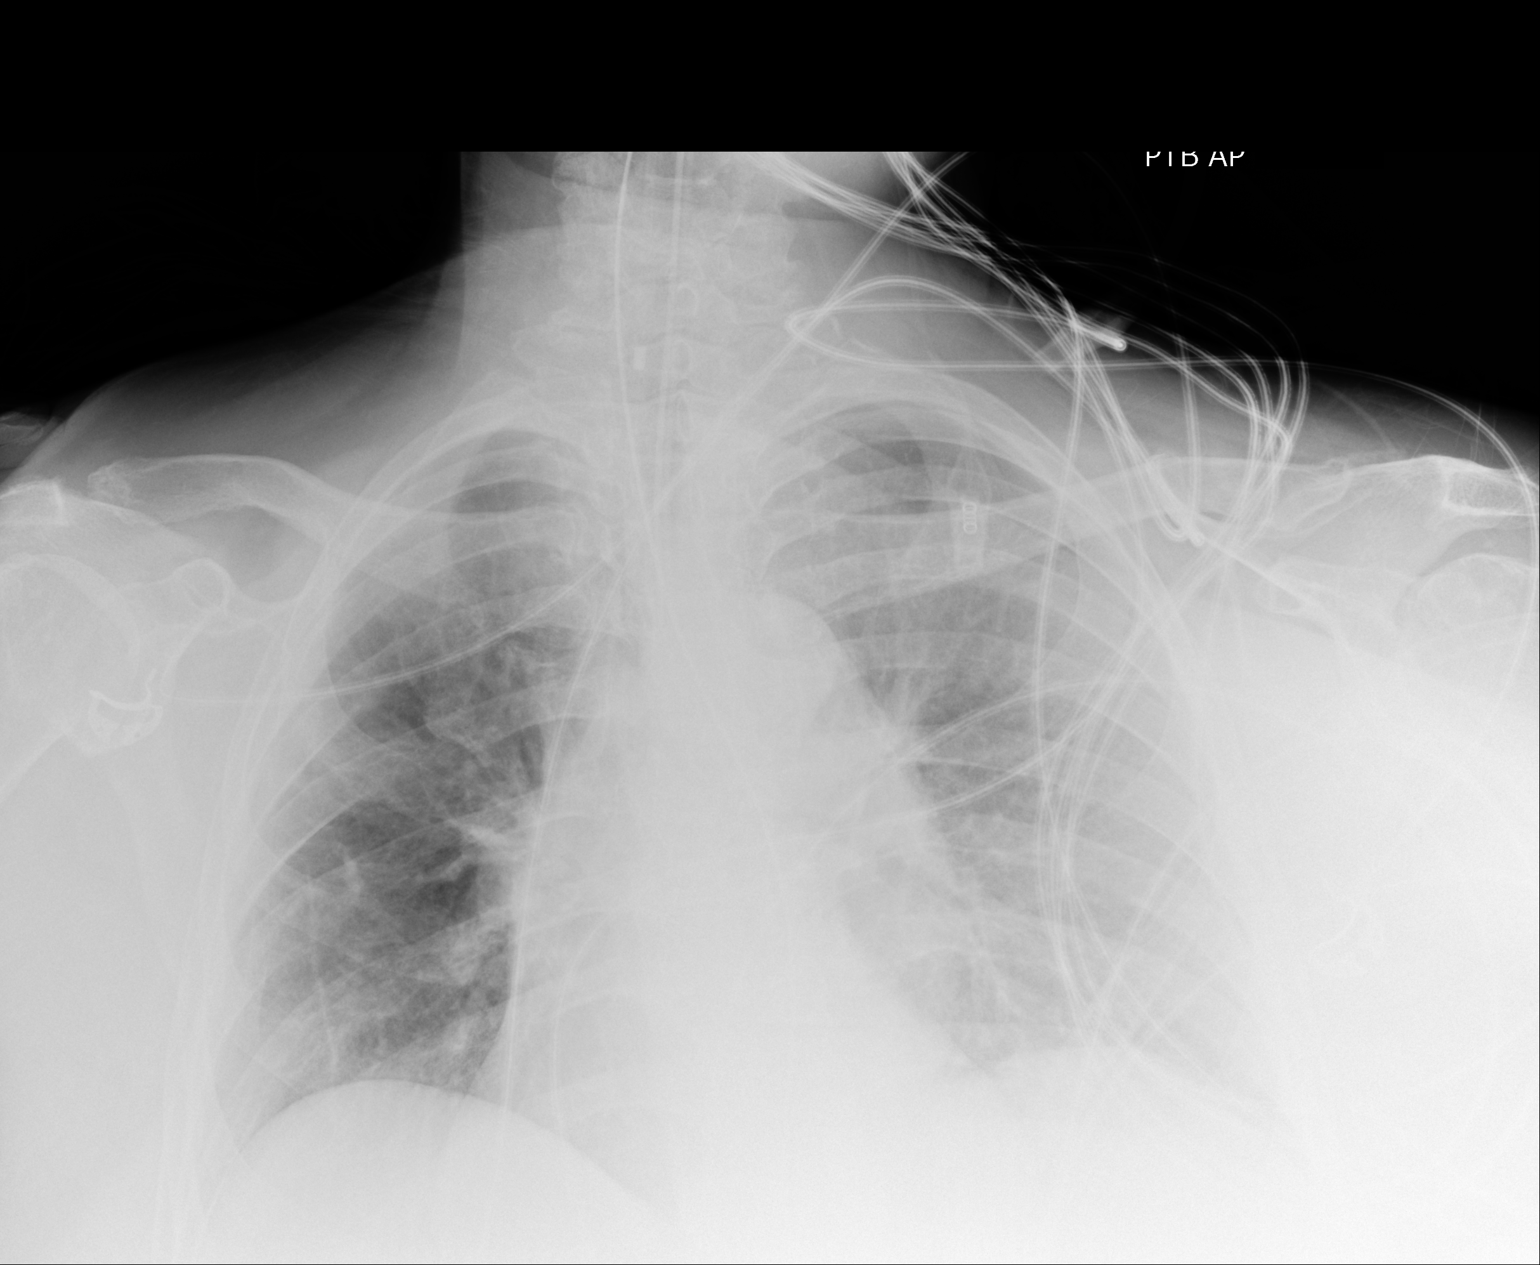

[1 of 1 positions shown; findings below may reference images not displayed]

FINDINGS: The endotracheal tube is 5 cm above carina. The NG tube is coursing
down the esophagus into the stomach. Low lung volumes with vascular
crowding atelectasis. There is tortuosity of the thoracic aorta.
IMPRESSION: Support apparatus in good position without complicating features.

Low lung volumes with vascular crowding and atelectasis.

## 2016-12-19 DIAGNOSIS — L97829 Non-pressure chronic ulcer of other part of left lower leg with unspecified severity: Secondary | ICD-10-CM | POA: Diagnosis not present

## 2016-12-20 IMAGING — CR DG CHEST 1V PORT
1 series · 1 of 1 positions shown · non-contrast
Comparison: 03/28/2014

CLINICAL DATA: Fever.  Cough.  Possible aspiration.

EXAM:
PORTABLE CHEST - 1 VIEW

[portable]
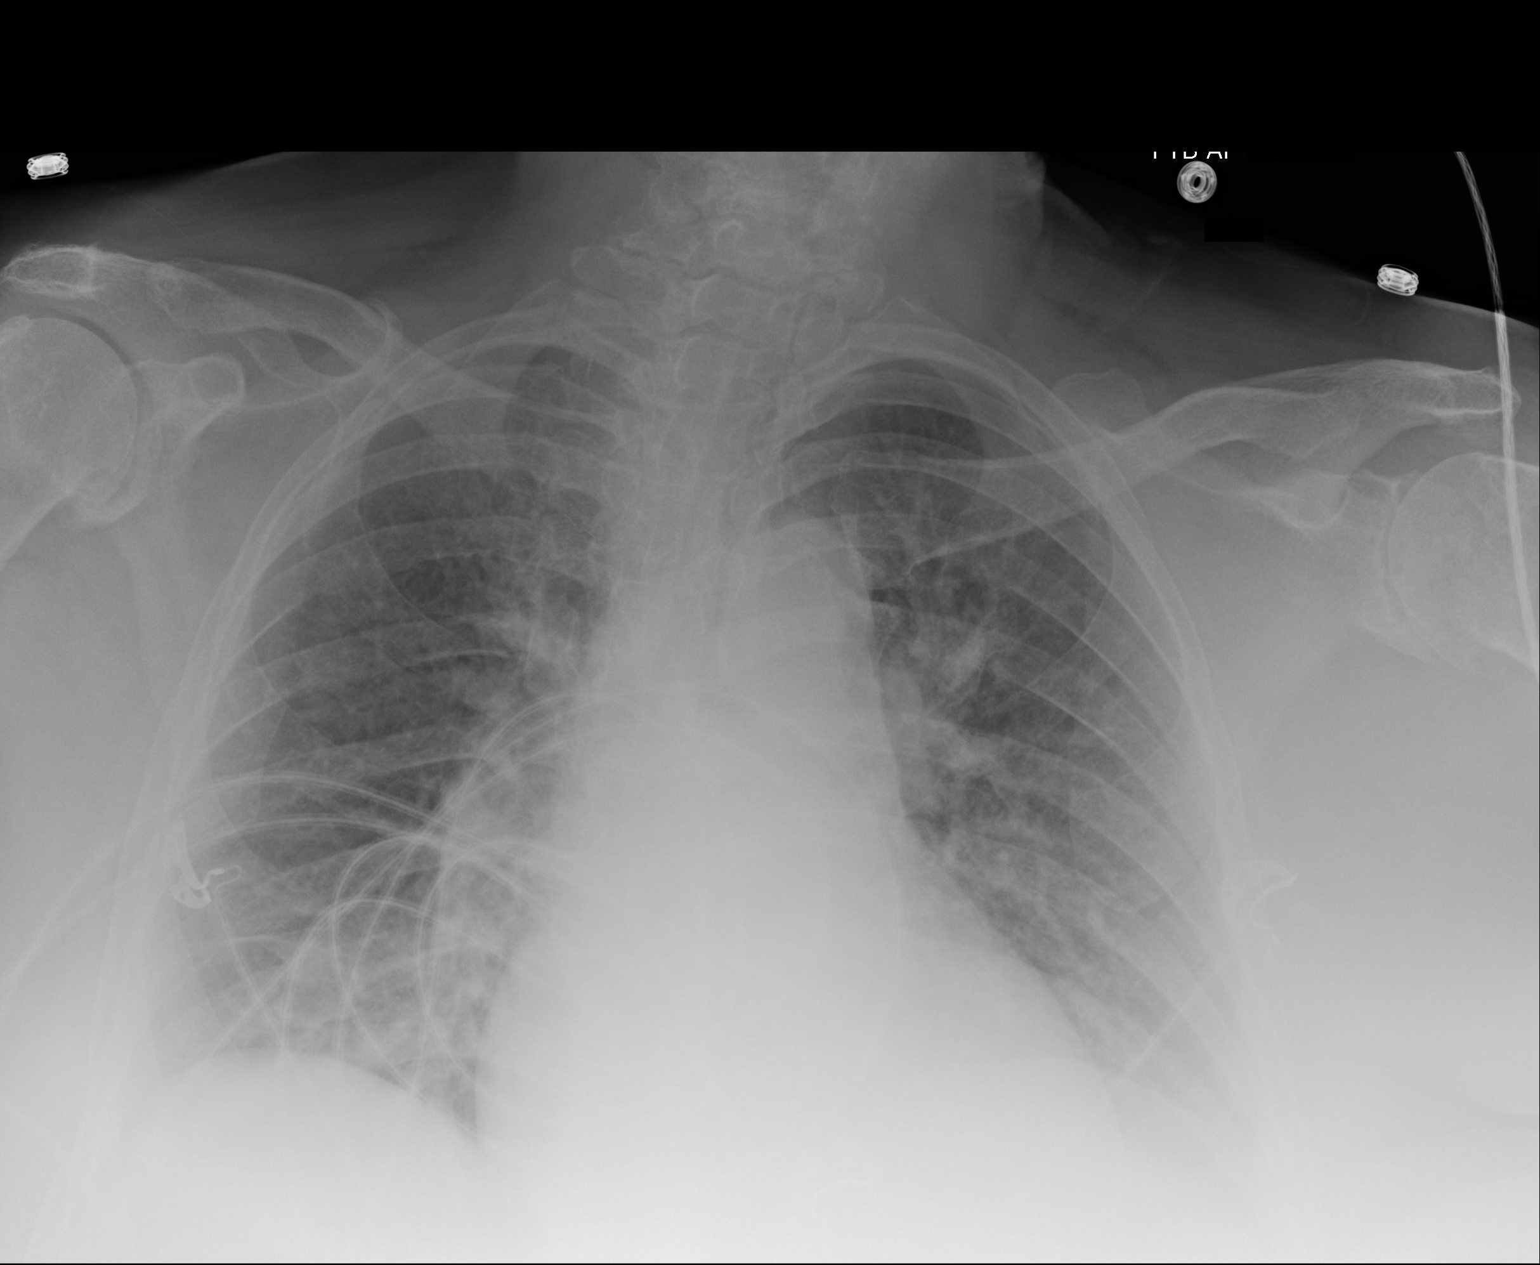

[1 of 1 positions shown; findings below may reference images not displayed]

FINDINGS: Low lung volumes are demonstrated. Diffuse pulmonary vascular
congestion again demonstrated. No focal areas of pulmonary
consolidation seen. No evidence of pleural effusion. Heart size is
stable.
IMPRESSION: Low lung volumes and diffuse pulmonary vascular congestion. No focal
consolidation or pleural effusion.

## 2016-12-21 DIAGNOSIS — F319 Bipolar disorder, unspecified: Secondary | ICD-10-CM | POA: Diagnosis not present

## 2016-12-21 DIAGNOSIS — F329 Major depressive disorder, single episode, unspecified: Secondary | ICD-10-CM | POA: Diagnosis not present

## 2016-12-21 DIAGNOSIS — F064 Anxiety disorder due to known physiological condition: Secondary | ICD-10-CM | POA: Diagnosis not present

## 2016-12-22 DIAGNOSIS — M4640 Discitis, unspecified, site unspecified: Secondary | ICD-10-CM | POA: Diagnosis not present

## 2016-12-22 DIAGNOSIS — E039 Hypothyroidism, unspecified: Secondary | ICD-10-CM | POA: Diagnosis not present

## 2016-12-22 DIAGNOSIS — G8220 Paraplegia, unspecified: Secondary | ICD-10-CM | POA: Diagnosis not present

## 2016-12-22 DIAGNOSIS — M109 Gout, unspecified: Secondary | ICD-10-CM | POA: Diagnosis not present

## 2016-12-30 DIAGNOSIS — L97829 Non-pressure chronic ulcer of other part of left lower leg with unspecified severity: Secondary | ICD-10-CM | POA: Diagnosis not present

## 2017-01-06 DIAGNOSIS — L97829 Non-pressure chronic ulcer of other part of left lower leg with unspecified severity: Secondary | ICD-10-CM | POA: Diagnosis not present

## 2017-01-09 DIAGNOSIS — L97829 Non-pressure chronic ulcer of other part of left lower leg with unspecified severity: Secondary | ICD-10-CM | POA: Diagnosis not present

## 2017-01-10 DIAGNOSIS — E1151 Type 2 diabetes mellitus with diabetic peripheral angiopathy without gangrene: Secondary | ICD-10-CM | POA: Diagnosis not present

## 2017-01-10 DIAGNOSIS — L84 Corns and callosities: Secondary | ICD-10-CM | POA: Diagnosis not present

## 2017-01-16 DIAGNOSIS — L97829 Non-pressure chronic ulcer of other part of left lower leg with unspecified severity: Secondary | ICD-10-CM | POA: Diagnosis not present

## 2017-01-23 DIAGNOSIS — L97829 Non-pressure chronic ulcer of other part of left lower leg with unspecified severity: Secondary | ICD-10-CM | POA: Diagnosis not present

## 2017-01-25 DIAGNOSIS — E039 Hypothyroidism, unspecified: Secondary | ICD-10-CM | POA: Diagnosis not present

## 2017-01-25 DIAGNOSIS — H04123 Dry eye syndrome of bilateral lacrimal glands: Secondary | ICD-10-CM | POA: Diagnosis not present

## 2017-01-25 DIAGNOSIS — J45909 Unspecified asthma, uncomplicated: Secondary | ICD-10-CM | POA: Diagnosis not present

## 2017-01-26 DIAGNOSIS — E039 Hypothyroidism, unspecified: Secondary | ICD-10-CM | POA: Diagnosis not present

## 2017-01-26 DIAGNOSIS — R531 Weakness: Secondary | ICD-10-CM | POA: Diagnosis not present

## 2017-01-26 DIAGNOSIS — D649 Anemia, unspecified: Secondary | ICD-10-CM | POA: Diagnosis not present

## 2017-01-26 DIAGNOSIS — E119 Type 2 diabetes mellitus without complications: Secondary | ICD-10-CM | POA: Diagnosis not present

## 2017-01-26 DIAGNOSIS — E785 Hyperlipidemia, unspecified: Secondary | ICD-10-CM | POA: Diagnosis not present

## 2017-01-27 DIAGNOSIS — E042 Nontoxic multinodular goiter: Secondary | ICD-10-CM | POA: Diagnosis not present

## 2017-01-27 IMAGING — MR MR LUMBAR SPINE WO/W CM
7 of 22 series · 17 of 48 positions shown · IV contrast (20    MH)
Comparison: Thoracic MRI 07/10/2006 and 06/30/2006

ADDENDUM:
Critical Value/emergent results were called by telephone at the time
of interpretation on 05/08/2014 at [DATE] to Dr. Drey , who verbally
acknowledged these results.
CLINICAL DATA: History of spinal abscess. Sepsis. Elevated white
blood count 88999. Hypertension. Abnormal x-ray at L2-3.

EXAM:
MRI THORACIC AND LUMBAR SPINE WITHOUT AND WITH CONTRAST
TECHNIQUE: Multiplanar and multiecho pulse sequences of the thoracic and lumbar
spine were obtained without and with intravenous contrast.
CONTRAST:  20mL MULTIHANCE GADOBENATE DIMEGLUMINE 529 MG/ML IV SOLN

[Series 4: T2 · sagittal · 4.0mm · 0.55mm/px · 3 of 22 slices shown (1 of 6)]
[im 1/22]
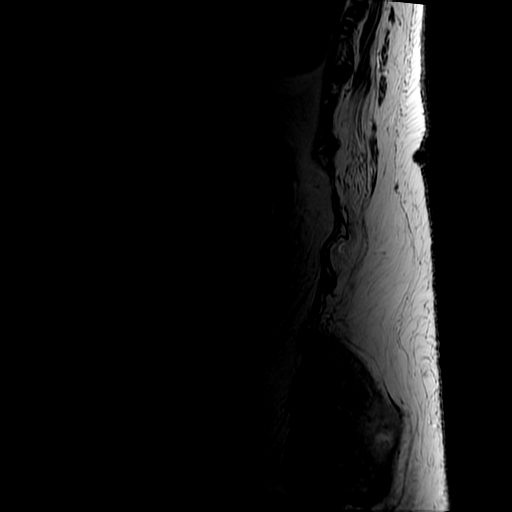
[im 11/22]
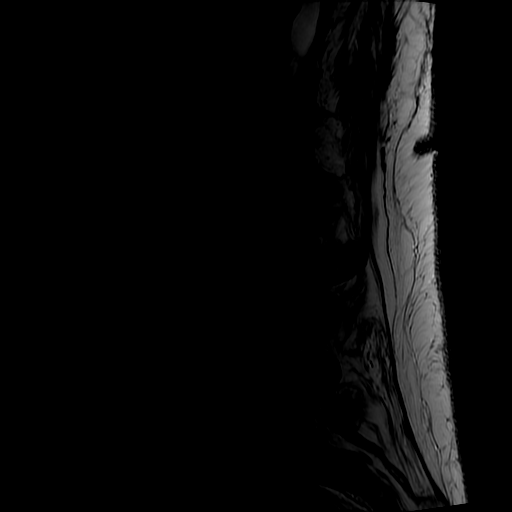
[im 22/22]
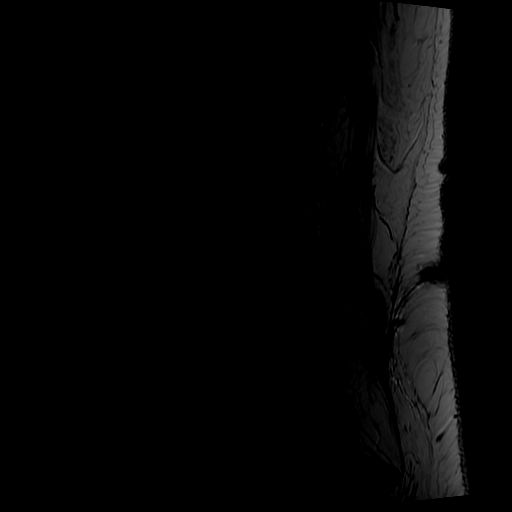

[Series 6: T1 · sagittal · 4.0mm · 0.55mm/px · 3 of 22 slices shown]
[im 1/22]
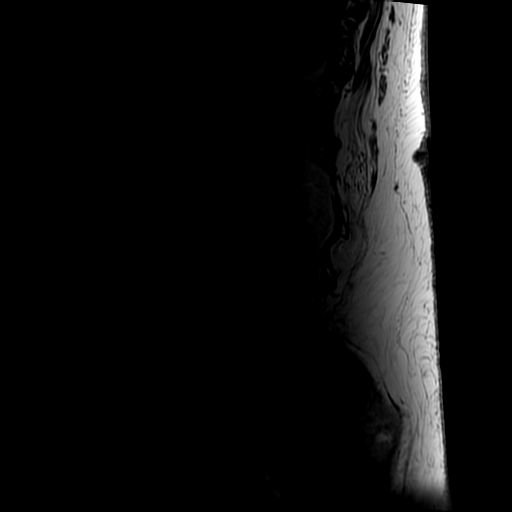
[im 11/22]
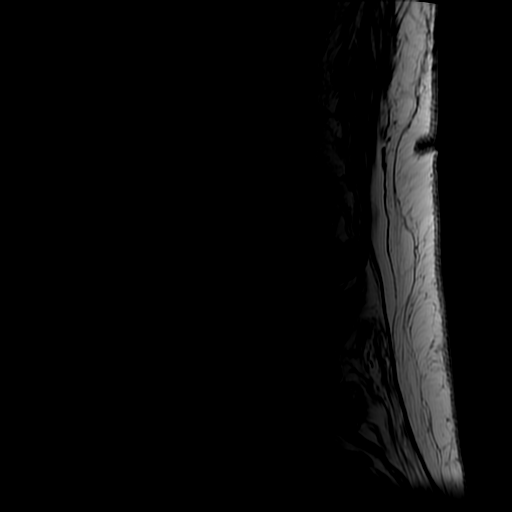
[im 22/22]
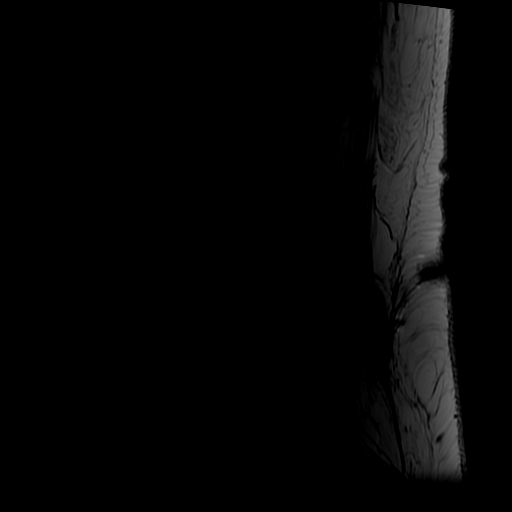

[Series 7: T2 · axial · 4.0mm · 0.39mm/px · z∈[-45,+164]mm · 5 of 43 slices shown (2 of 6)]
[im 1/43]
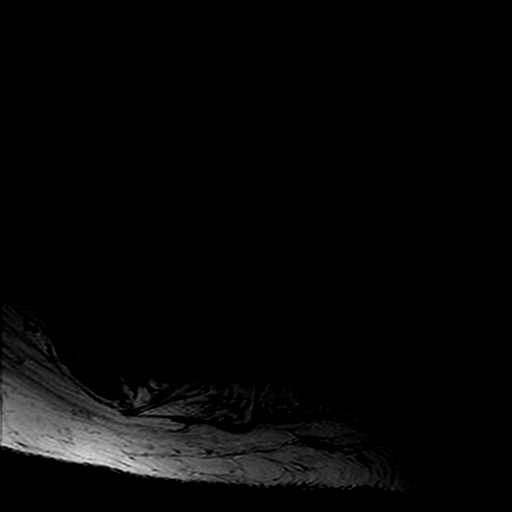
[im 11/43]
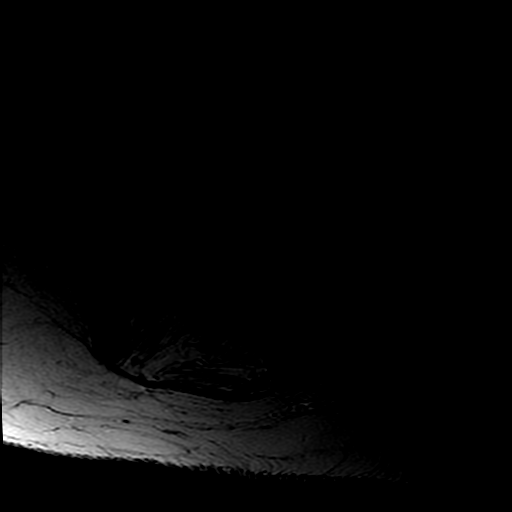
[im 22/43]
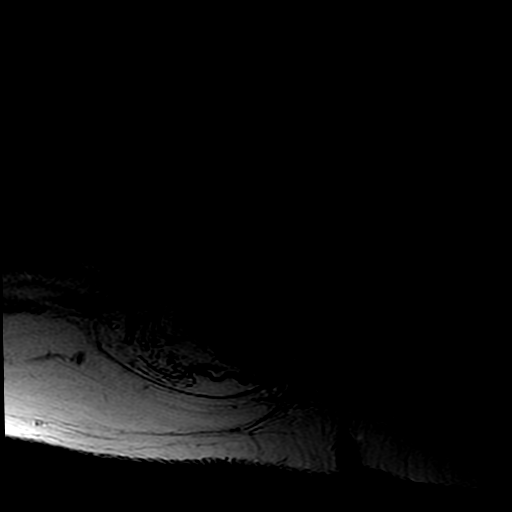
[im 32/43]
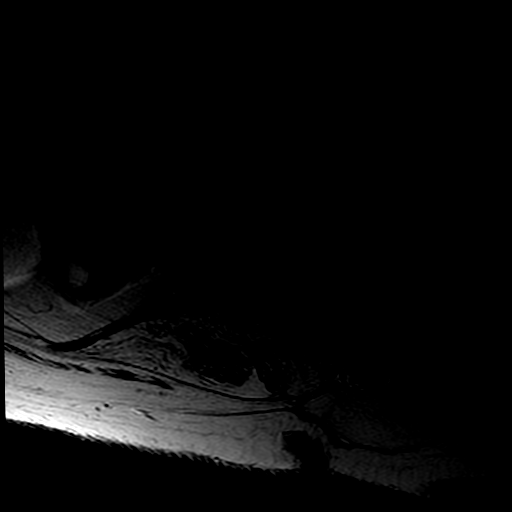
[im 43/43]
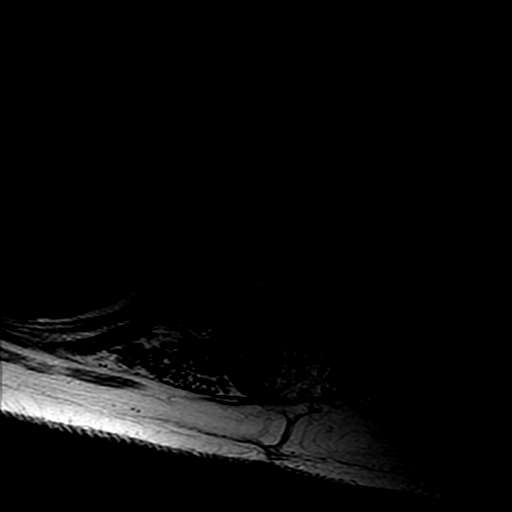

[Series 10: T2 · sagittal · 4.0mm · 0.55mm/px · 1 of 12 slices shown (3 of 6)]
[im 1/12]
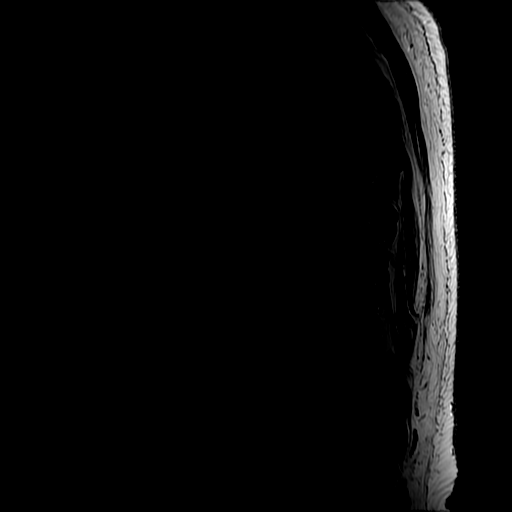

[Series 14: T2 · sagittal · 3.0mm · 0.66mm/px · 1 of 15 slices shown (4 of 6)]
[im 1/15]
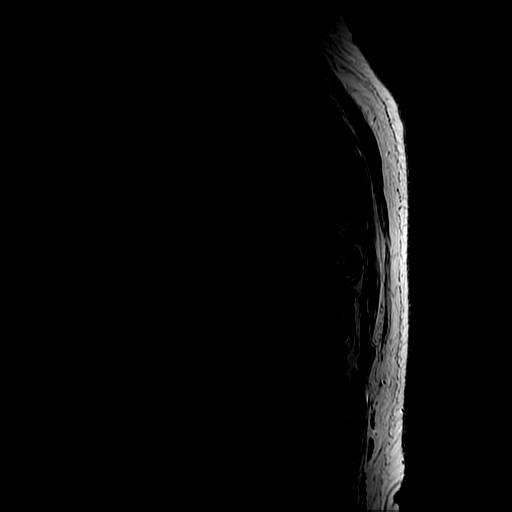

[Series 17: T2 · axial · 4.0mm · 0.39mm/px · z∈[+294,+421]mm · 2 of 28 slices shown (5 of 6)]
[im 1/28]
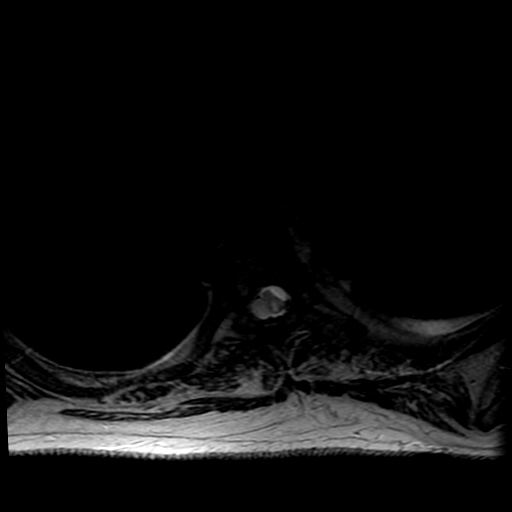
[im 28/28]
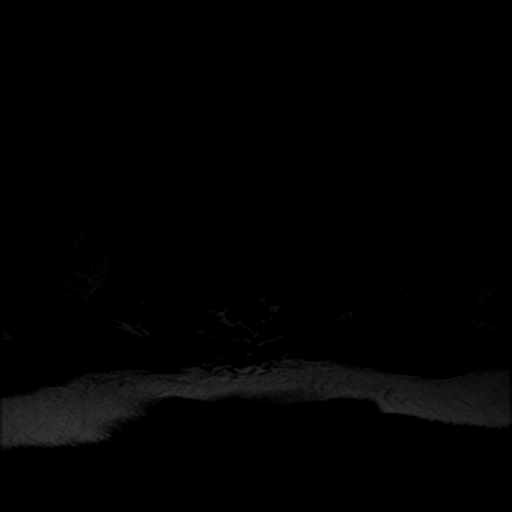

[Series 21: T2 · axial · 4.0mm · 0.39mm/px · z∈[+157,+306]mm · 2 of 28 slices shown (6 of 6)]
[im 1/28]
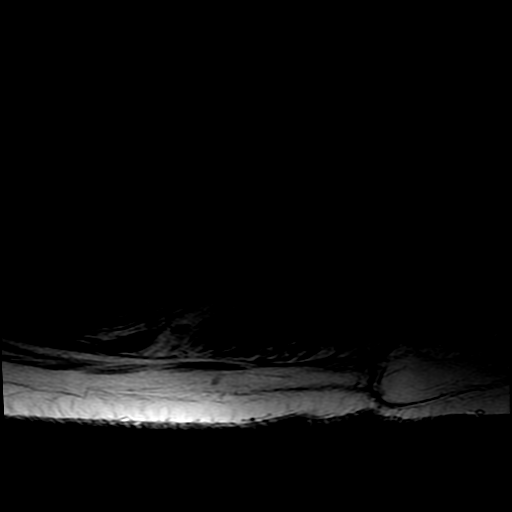
[im 28/28]
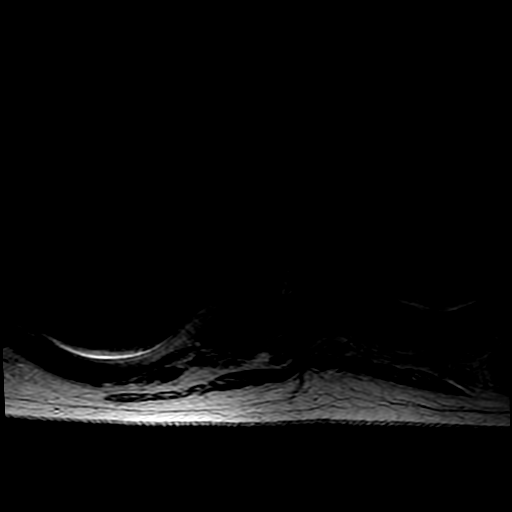

[17 of 48 positions shown; findings below may reference images not displayed]

FINDINGS: MR THORACIC SPINE FINDINGS

Image quality degraded by motion.

Multiple hemangiomata in the thoracic spine vertebral bodies
including T4, T8, T10, T12. Negative for fracture. Postcontrast
imaging significantly degraded by motion however no evidence of
neoplasm is identified

The spinal canal is markedly abnormal. There is a large fluid
collection in the spinal canal beginning at approximately T3 and
extending third T11. Based on axial images, this appears to be
extramedullary and has CSF signal intensity. There is some layering
low signal material within the cyst on gradient echo imaging
suggesting there may be blood within the fluid. This is compressing
the cord beginning at T4 and extending through T12. The cord is
difficult to identify below T7 and appears markedly atrophic.
Postcontrast imaging does not have significant dural enhancement
suggesting epidural abscess.

Large mass within the spinal canal the T7 level of uncertain
etiology. This is low signal on T1 and T2 and could represent a
hematoma or large calcified disc fragment. This also is contributing
to significant cord compression.

Multilevel disc degeneration and spondylosis most prominent at C6-7,
T7-8, T8-9, and T9-10 contribute to spinal stenosis.

There has been decompressive laminectomy T10 through T12. Correlate
with prior surgical findings.

MR LUMBAR SPINE FINDINGS

Image quality degraded by significant motion.

Bony destruction at L2-3 centered at the disc space with extensive
fluid between the upper L2 lower L3 vertebral bodies. This area
shows diffuse enhancement following contrast infusion. In addition,
there is diffuse dural enhancement in the lower lumbar canal
suggesting spinal infection and epidural infection. Enhancement
extends into the facet joints bilaterally. There is rotation of L2
on L3 secondary to instability. There is severe spinal stenosis at
L2-3 with marked compression of the thecal sac.

Epidural fluid collection extends to T11 with compression of the
cord as described above. Conus medullaris terminates at T12-L1.
Possible syrinx in the distal conus.

Chronic degenerative changes are present throughout the lumbar spine
involving the disc spaces and facet joints.
IMPRESSION: Image quality is significantly degraded by motion in the thoracic
and lumbar spine.

Large extramedullary fluid collection in the thoracic spine from T4
through T11. This is compressing the cord. There is marked cord
atrophy especially at T7-8 and below. This fluid collection could be
related to chronic hematoma as some of the signal characteristics
suggests layering blood. There is also a non cystic mass within the
spinal canal compressing the cord at the T7 level which could be
hematoma. Calcified disc or epidural mass are other considerations.

The marked destructive process at L2-3 with severe spinal stenosis.
There is extensive enhancement of a large fluid collection between
the L2 and L3 vertebral bodies extending into the facet joints
bilaterally, concerning for osteomyelitis and abscess, particularly
given the patient's clinical findings.

## 2017-02-07 DIAGNOSIS — M4640 Discitis, unspecified, site unspecified: Secondary | ICD-10-CM | POA: Diagnosis not present

## 2017-02-07 DIAGNOSIS — I10 Essential (primary) hypertension: Secondary | ICD-10-CM | POA: Diagnosis not present

## 2017-02-07 DIAGNOSIS — M4300 Spondylolysis, site unspecified: Secondary | ICD-10-CM | POA: Diagnosis not present

## 2017-02-07 DIAGNOSIS — M109 Gout, unspecified: Secondary | ICD-10-CM | POA: Diagnosis not present

## 2017-02-24 ENCOUNTER — Ambulatory Visit: Payer: Medicare Other | Admitting: Urology

## 2017-02-24 DIAGNOSIS — F321 Major depressive disorder, single episode, moderate: Secondary | ICD-10-CM | POA: Diagnosis not present

## 2017-02-24 DIAGNOSIS — F411 Generalized anxiety disorder: Secondary | ICD-10-CM | POA: Diagnosis not present

## 2017-02-24 DIAGNOSIS — F3181 Bipolar II disorder: Secondary | ICD-10-CM | POA: Diagnosis not present

## 2017-03-01 ENCOUNTER — Ambulatory Visit: Payer: Self-pay | Admitting: "Endocrinology

## 2017-03-01 DIAGNOSIS — J45909 Unspecified asthma, uncomplicated: Secondary | ICD-10-CM | POA: Diagnosis not present

## 2017-03-01 DIAGNOSIS — E119 Type 2 diabetes mellitus without complications: Secondary | ICD-10-CM | POA: Diagnosis not present

## 2017-03-01 DIAGNOSIS — E785 Hyperlipidemia, unspecified: Secondary | ICD-10-CM | POA: Diagnosis not present

## 2017-03-01 DIAGNOSIS — I10 Essential (primary) hypertension: Secondary | ICD-10-CM | POA: Diagnosis not present

## 2017-03-03 DIAGNOSIS — E119 Type 2 diabetes mellitus without complications: Secondary | ICD-10-CM | POA: Diagnosis not present

## 2017-03-04 DIAGNOSIS — G8220 Paraplegia, unspecified: Secondary | ICD-10-CM | POA: Diagnosis not present

## 2017-03-04 DIAGNOSIS — R41841 Cognitive communication deficit: Secondary | ICD-10-CM | POA: Diagnosis not present

## 2017-03-04 DIAGNOSIS — M6281 Muscle weakness (generalized): Secondary | ICD-10-CM | POA: Diagnosis not present

## 2017-03-08 DIAGNOSIS — E782 Mixed hyperlipidemia: Secondary | ICD-10-CM | POA: Diagnosis not present

## 2017-03-08 DIAGNOSIS — E119 Type 2 diabetes mellitus without complications: Secondary | ICD-10-CM | POA: Diagnosis not present

## 2017-03-08 DIAGNOSIS — E039 Hypothyroidism, unspecified: Secondary | ICD-10-CM | POA: Diagnosis not present

## 2017-03-08 DIAGNOSIS — D649 Anemia, unspecified: Secondary | ICD-10-CM | POA: Diagnosis not present

## 2017-03-08 DIAGNOSIS — D518 Other vitamin B12 deficiency anemias: Secondary | ICD-10-CM | POA: Diagnosis not present

## 2017-03-08 DIAGNOSIS — E7800 Pure hypercholesterolemia, unspecified: Secondary | ICD-10-CM | POA: Diagnosis not present

## 2017-03-08 DIAGNOSIS — Z79899 Other long term (current) drug therapy: Secondary | ICD-10-CM | POA: Diagnosis not present

## 2017-03-08 DIAGNOSIS — D688 Other specified coagulation defects: Secondary | ICD-10-CM | POA: Diagnosis not present

## 2017-03-09 DIAGNOSIS — K760 Fatty (change of) liver, not elsewhere classified: Secondary | ICD-10-CM | POA: Diagnosis not present

## 2017-03-09 DIAGNOSIS — I2510 Atherosclerotic heart disease of native coronary artery without angina pectoris: Secondary | ICD-10-CM | POA: Diagnosis not present

## 2017-03-09 DIAGNOSIS — K5900 Constipation, unspecified: Secondary | ICD-10-CM | POA: Diagnosis not present

## 2017-03-09 DIAGNOSIS — F419 Anxiety disorder, unspecified: Secondary | ICD-10-CM | POA: Diagnosis not present

## 2017-03-13 DIAGNOSIS — K760 Fatty (change of) liver, not elsewhere classified: Secondary | ICD-10-CM | POA: Diagnosis not present

## 2017-03-13 DIAGNOSIS — I2510 Atherosclerotic heart disease of native coronary artery without angina pectoris: Secondary | ICD-10-CM | POA: Diagnosis not present

## 2017-03-13 DIAGNOSIS — F419 Anxiety disorder, unspecified: Secondary | ICD-10-CM | POA: Diagnosis not present

## 2017-03-13 DIAGNOSIS — K5900 Constipation, unspecified: Secondary | ICD-10-CM | POA: Diagnosis not present

## 2017-03-22 DIAGNOSIS — D649 Anemia, unspecified: Secondary | ICD-10-CM | POA: Diagnosis not present

## 2017-03-22 DIAGNOSIS — Z79899 Other long term (current) drug therapy: Secondary | ICD-10-CM | POA: Diagnosis not present

## 2017-04-01 DIAGNOSIS — E559 Vitamin D deficiency, unspecified: Secondary | ICD-10-CM | POA: Diagnosis not present

## 2017-04-01 DIAGNOSIS — G8220 Paraplegia, unspecified: Secondary | ICD-10-CM | POA: Diagnosis not present

## 2017-04-01 DIAGNOSIS — E039 Hypothyroidism, unspecified: Secondary | ICD-10-CM | POA: Diagnosis not present

## 2017-04-01 DIAGNOSIS — M6281 Muscle weakness (generalized): Secondary | ICD-10-CM | POA: Diagnosis not present

## 2017-04-01 DIAGNOSIS — R41841 Cognitive communication deficit: Secondary | ICD-10-CM | POA: Diagnosis not present

## 2017-04-01 DIAGNOSIS — D649 Anemia, unspecified: Secondary | ICD-10-CM | POA: Diagnosis not present

## 2017-04-01 DIAGNOSIS — E119 Type 2 diabetes mellitus without complications: Secondary | ICD-10-CM | POA: Diagnosis not present

## 2017-04-04 DIAGNOSIS — I2510 Atherosclerotic heart disease of native coronary artery without angina pectoris: Secondary | ICD-10-CM | POA: Diagnosis not present

## 2017-04-04 DIAGNOSIS — K760 Fatty (change of) liver, not elsewhere classified: Secondary | ICD-10-CM | POA: Diagnosis not present

## 2017-04-04 DIAGNOSIS — J208 Acute bronchitis due to other specified organisms: Secondary | ICD-10-CM | POA: Diagnosis not present

## 2017-04-04 DIAGNOSIS — K5900 Constipation, unspecified: Secondary | ICD-10-CM | POA: Diagnosis not present

## 2017-04-09 ENCOUNTER — Other Ambulatory Visit
Admission: RE | Admit: 2017-04-09 | Discharge: 2017-04-09 | Disposition: A | Discharge status: Home / Self Care | Payer: Medicare Other | Source: Ambulatory Visit | Attending: Internal Medicine | Admitting: Internal Medicine

## 2017-04-09 DIAGNOSIS — R05 Cough: Secondary | ICD-10-CM | POA: Diagnosis not present

## 2017-04-09 DIAGNOSIS — D649 Anemia, unspecified: Secondary | ICD-10-CM | POA: Insufficient documentation

## 2017-04-09 DIAGNOSIS — R0989 Other specified symptoms and signs involving the circulatory and respiratory systems: Secondary | ICD-10-CM | POA: Diagnosis not present

## 2017-04-09 LAB — CBC WITH DIFFERENTIAL/PLATELET
BASOS ABS: 0.1 10*3/uL (ref 0–0.1)
BASOS PCT: 1 %
EOS ABS: 0.1 10*3/uL (ref 0–0.7)
EOS PCT: 0 %
HCT: 37.8 % (ref 35.0–47.0)
Hemoglobin: 12 g/dL (ref 12.0–16.0)
Lymphocytes Relative: 21 %
Lymphs Abs: 2.8 10*3/uL (ref 1.0–3.6)
MCH: 27.2 pg (ref 26.0–34.0)
MCHC: 31.8 g/dL — ABNORMAL LOW (ref 32.0–36.0)
MCV: 85.4 fL (ref 80.0–100.0)
MONO ABS: 0.8 10*3/uL (ref 0.2–0.9)
Monocytes Relative: 6 %
NEUTROS ABS: 10 10*3/uL — AB (ref 1.4–6.5)
Neutrophils Relative %: 72 %
PLATELETS: 176 10*3/uL (ref 150–440)
RBC: 4.42 MIL/uL (ref 3.80–5.20)
RDW: 17 % — AB (ref 11.5–14.5)
WBC: 13.7 10*3/uL — ABNORMAL HIGH (ref 3.6–11.0)

## 2017-04-09 LAB — URINALYSIS, COMPLETE (UACMP) WITH MICROSCOPIC
BILIRUBIN URINE: NEGATIVE
Glucose, UA: NEGATIVE mg/dL
Ketones, ur: NEGATIVE mg/dL
NITRITE: NEGATIVE
PH: 6 (ref 5.0–8.0)
Protein, ur: 30 mg/dL — AB
SPECIFIC GRAVITY, URINE: 1.009 (ref 1.005–1.030)

## 2017-04-09 LAB — COMPREHENSIVE METABOLIC PANEL
ALBUMIN: 3.5 g/dL (ref 3.5–5.0)
ALK PHOS: 94 U/L (ref 38–126)
ALT: 29 U/L (ref 14–54)
ANION GAP: 13 (ref 5–15)
AST: 37 U/L (ref 15–41)
BILIRUBIN TOTAL: 0.8 mg/dL (ref 0.3–1.2)
BUN: 20 mg/dL (ref 6–20)
CALCIUM: 8.5 mg/dL — AB (ref 8.9–10.3)
CO2: 20 mmol/L — ABNORMAL LOW (ref 22–32)
Chloride: 101 mmol/L (ref 101–111)
Creatinine, Ser: 0.59 mg/dL (ref 0.44–1.00)
GFR calc Af Amer: >60 mL/min (ref >60)
GFR calc non Af Amer: >60 mL/min (ref >60)
GLUCOSE: 153 mg/dL — AB (ref 65–99)
Potassium: 4.8 mmol/L (ref 3.5–5.1)
Sodium: 134 mmol/L — ABNORMAL LOW (ref 135–145)
TOTAL PROTEIN: 6.6 g/dL (ref 6.5–8.1)

## 2017-04-11 DIAGNOSIS — F419 Anxiety disorder, unspecified: Secondary | ICD-10-CM | POA: Diagnosis not present

## 2017-04-11 DIAGNOSIS — D72829 Elevated white blood cell count, unspecified: Secondary | ICD-10-CM | POA: Diagnosis not present

## 2017-04-11 DIAGNOSIS — J208 Acute bronchitis due to other specified organisms: Secondary | ICD-10-CM | POA: Diagnosis not present

## 2017-04-11 DIAGNOSIS — N390 Urinary tract infection, site not specified: Secondary | ICD-10-CM | POA: Diagnosis not present

## 2017-04-19 DIAGNOSIS — F419 Anxiety disorder, unspecified: Secondary | ICD-10-CM | POA: Diagnosis not present

## 2017-04-19 DIAGNOSIS — K5900 Constipation, unspecified: Secondary | ICD-10-CM | POA: Diagnosis not present

## 2017-04-19 DIAGNOSIS — K760 Fatty (change of) liver, not elsewhere classified: Secondary | ICD-10-CM | POA: Diagnosis not present

## 2017-04-19 DIAGNOSIS — I2510 Atherosclerotic heart disease of native coronary artery without angina pectoris: Secondary | ICD-10-CM | POA: Diagnosis not present

## 2017-04-20 DIAGNOSIS — G8220 Paraplegia, unspecified: Secondary | ICD-10-CM | POA: Diagnosis not present

## 2017-04-21 DIAGNOSIS — G8220 Paraplegia, unspecified: Secondary | ICD-10-CM | POA: Diagnosis not present

## 2017-04-21 DIAGNOSIS — N390 Urinary tract infection, site not specified: Secondary | ICD-10-CM | POA: Diagnosis not present

## 2017-04-21 DIAGNOSIS — N319 Neuromuscular dysfunction of bladder, unspecified: Secondary | ICD-10-CM | POA: Diagnosis not present

## 2017-04-21 DIAGNOSIS — R143 Flatulence: Secondary | ICD-10-CM | POA: Diagnosis not present

## 2017-05-02 DIAGNOSIS — R062 Wheezing: Secondary | ICD-10-CM | POA: Diagnosis not present

## 2017-05-02 DIAGNOSIS — N390 Urinary tract infection, site not specified: Secondary | ICD-10-CM | POA: Diagnosis not present

## 2017-05-02 DIAGNOSIS — G8220 Paraplegia, unspecified: Secondary | ICD-10-CM | POA: Diagnosis not present

## 2017-05-02 DIAGNOSIS — R05 Cough: Secondary | ICD-10-CM | POA: Diagnosis not present

## 2017-05-02 DIAGNOSIS — R0989 Other specified symptoms and signs involving the circulatory and respiratory systems: Secondary | ICD-10-CM | POA: Diagnosis not present

## 2017-05-02 DIAGNOSIS — R143 Flatulence: Secondary | ICD-10-CM | POA: Diagnosis not present

## 2017-05-04 DIAGNOSIS — G8220 Paraplegia, unspecified: Secondary | ICD-10-CM | POA: Diagnosis not present

## 2017-05-04 DIAGNOSIS — R062 Wheezing: Secondary | ICD-10-CM | POA: Diagnosis not present

## 2017-05-04 DIAGNOSIS — J181 Lobar pneumonia, unspecified organism: Secondary | ICD-10-CM | POA: Diagnosis not present

## 2017-05-09 DIAGNOSIS — G8220 Paraplegia, unspecified: Secondary | ICD-10-CM | POA: Diagnosis not present

## 2017-05-09 DIAGNOSIS — J181 Lobar pneumonia, unspecified organism: Secondary | ICD-10-CM | POA: Diagnosis not present

## 2017-05-09 DIAGNOSIS — R062 Wheezing: Secondary | ICD-10-CM | POA: Diagnosis not present

## 2017-05-09 DIAGNOSIS — R0981 Nasal congestion: Secondary | ICD-10-CM | POA: Diagnosis not present

## 2017-05-21 DIAGNOSIS — R0989 Other specified symptoms and signs involving the circulatory and respiratory systems: Secondary | ICD-10-CM | POA: Diagnosis not present

## 2017-05-21 DIAGNOSIS — J181 Lobar pneumonia, unspecified organism: Secondary | ICD-10-CM | POA: Diagnosis not present

## 2017-05-21 DIAGNOSIS — R0981 Nasal congestion: Secondary | ICD-10-CM | POA: Diagnosis not present

## 2017-05-21 DIAGNOSIS — G8220 Paraplegia, unspecified: Secondary | ICD-10-CM | POA: Diagnosis not present

## 2017-05-21 DIAGNOSIS — R062 Wheezing: Secondary | ICD-10-CM | POA: Diagnosis not present

## 2017-05-22 DIAGNOSIS — I2510 Atherosclerotic heart disease of native coronary artery without angina pectoris: Secondary | ICD-10-CM | POA: Diagnosis not present

## 2017-05-22 DIAGNOSIS — J189 Pneumonia, unspecified organism: Secondary | ICD-10-CM | POA: Diagnosis not present

## 2017-05-22 DIAGNOSIS — K5900 Constipation, unspecified: Secondary | ICD-10-CM | POA: Diagnosis not present

## 2017-05-22 DIAGNOSIS — K760 Fatty (change of) liver, not elsewhere classified: Secondary | ICD-10-CM | POA: Diagnosis not present

## 2017-06-22 DIAGNOSIS — F419 Anxiety disorder, unspecified: Secondary | ICD-10-CM | POA: Diagnosis not present

## 2017-06-22 DIAGNOSIS — M25512 Pain in left shoulder: Secondary | ICD-10-CM | POA: Diagnosis not present

## 2017-06-22 DIAGNOSIS — L539 Erythematous condition, unspecified: Secondary | ICD-10-CM | POA: Diagnosis not present

## 2017-06-22 DIAGNOSIS — R05 Cough: Secondary | ICD-10-CM | POA: Diagnosis not present

## 2017-07-06 DIAGNOSIS — R0981 Nasal congestion: Secondary | ICD-10-CM | POA: Diagnosis not present

## 2017-07-06 DIAGNOSIS — D171 Benign lipomatous neoplasm of skin and subcutaneous tissue of trunk: Secondary | ICD-10-CM | POA: Diagnosis not present

## 2017-07-06 DIAGNOSIS — J209 Acute bronchitis, unspecified: Secondary | ICD-10-CM | POA: Diagnosis not present

## 2017-07-06 DIAGNOSIS — G43119 Migraine with aura, intractable, without status migrainosus: Secondary | ICD-10-CM | POA: Diagnosis not present

## 2017-07-24 DIAGNOSIS — F419 Anxiety disorder, unspecified: Secondary | ICD-10-CM | POA: Diagnosis not present

## 2017-07-24 DIAGNOSIS — K5900 Constipation, unspecified: Secondary | ICD-10-CM | POA: Diagnosis not present

## 2017-07-24 DIAGNOSIS — K760 Fatty (change of) liver, not elsewhere classified: Secondary | ICD-10-CM | POA: Diagnosis not present

## 2017-07-24 DIAGNOSIS — I2510 Atherosclerotic heart disease of native coronary artery without angina pectoris: Secondary | ICD-10-CM | POA: Diagnosis not present

## 2017-08-10 DIAGNOSIS — K760 Fatty (change of) liver, not elsewhere classified: Secondary | ICD-10-CM | POA: Diagnosis not present

## 2017-08-10 DIAGNOSIS — K5900 Constipation, unspecified: Secondary | ICD-10-CM | POA: Diagnosis not present

## 2017-08-10 DIAGNOSIS — I2510 Atherosclerotic heart disease of native coronary artery without angina pectoris: Secondary | ICD-10-CM | POA: Diagnosis not present

## 2017-08-10 DIAGNOSIS — F319 Bipolar disorder, unspecified: Secondary | ICD-10-CM | POA: Diagnosis not present

## 2017-09-05 DIAGNOSIS — K760 Fatty (change of) liver, not elsewhere classified: Secondary | ICD-10-CM | POA: Diagnosis not present

## 2017-09-05 DIAGNOSIS — F419 Anxiety disorder, unspecified: Secondary | ICD-10-CM | POA: Diagnosis not present

## 2017-09-05 DIAGNOSIS — I2510 Atherosclerotic heart disease of native coronary artery without angina pectoris: Secondary | ICD-10-CM | POA: Diagnosis not present

## 2017-09-05 DIAGNOSIS — K5900 Constipation, unspecified: Secondary | ICD-10-CM | POA: Diagnosis not present

## 2017-09-18 DIAGNOSIS — Z79899 Other long term (current) drug therapy: Secondary | ICD-10-CM | POA: Diagnosis not present

## 2017-09-18 DIAGNOSIS — D649 Anemia, unspecified: Secondary | ICD-10-CM | POA: Diagnosis not present

## 2017-09-18 DIAGNOSIS — E119 Type 2 diabetes mellitus without complications: Secondary | ICD-10-CM | POA: Diagnosis not present

## 2017-09-25 DIAGNOSIS — R0989 Other specified symptoms and signs involving the circulatory and respiratory systems: Secondary | ICD-10-CM | POA: Diagnosis not present

## 2017-09-25 DIAGNOSIS — R509 Fever, unspecified: Secondary | ICD-10-CM | POA: Diagnosis not present

## 2017-09-25 DIAGNOSIS — R05 Cough: Secondary | ICD-10-CM | POA: Diagnosis not present

## 2017-09-26 DIAGNOSIS — Z79899 Other long term (current) drug therapy: Secondary | ICD-10-CM | POA: Diagnosis not present

## 2017-09-27 DIAGNOSIS — I517 Cardiomegaly: Secondary | ICD-10-CM | POA: Diagnosis not present

## 2017-09-27 DIAGNOSIS — R509 Fever, unspecified: Secondary | ICD-10-CM | POA: Diagnosis not present

## 2017-09-27 DIAGNOSIS — I509 Heart failure, unspecified: Secondary | ICD-10-CM | POA: Diagnosis not present

## 2017-09-27 DIAGNOSIS — M1990 Unspecified osteoarthritis, unspecified site: Secondary | ICD-10-CM | POA: Diagnosis not present

## 2017-09-27 DIAGNOSIS — Z79899 Other long term (current) drug therapy: Secondary | ICD-10-CM | POA: Diagnosis not present

## 2017-09-30 DIAGNOSIS — R05 Cough: Secondary | ICD-10-CM | POA: Diagnosis not present

## 2017-09-30 DIAGNOSIS — R0989 Other specified symptoms and signs involving the circulatory and respiratory systems: Secondary | ICD-10-CM | POA: Diagnosis not present

## 2017-10-04 DIAGNOSIS — E119 Type 2 diabetes mellitus without complications: Secondary | ICD-10-CM | POA: Diagnosis not present

## 2017-10-04 DIAGNOSIS — G43909 Migraine, unspecified, not intractable, without status migrainosus: Secondary | ICD-10-CM | POA: Diagnosis not present

## 2017-10-04 DIAGNOSIS — I10 Essential (primary) hypertension: Secondary | ICD-10-CM | POA: Diagnosis not present

## 2017-10-04 DIAGNOSIS — E039 Hypothyroidism, unspecified: Secondary | ICD-10-CM | POA: Diagnosis not present

## 2017-10-11 DIAGNOSIS — R0989 Other specified symptoms and signs involving the circulatory and respiratory systems: Secondary | ICD-10-CM | POA: Diagnosis not present

## 2017-10-12 DIAGNOSIS — N390 Urinary tract infection, site not specified: Secondary | ICD-10-CM | POA: Diagnosis not present

## 2019-03-19 ENCOUNTER — Other Ambulatory Visit (HOSPITAL_BASED_OUTPATIENT_CLINIC_OR_DEPARTMENT_OTHER): Payer: Self-pay | Admitting: Internal Medicine

## 2019-03-19 ENCOUNTER — Other Ambulatory Visit: Payer: Self-pay

## 2019-03-19 ENCOUNTER — Other Ambulatory Visit (HOSPITAL_COMMUNITY)
Admission: RE | Admit: 2019-03-19 | Discharge: 2019-03-19 | Disposition: A | Discharge status: Home / Self Care | Payer: Medicare Other | Source: Other Acute Inpatient Hospital | Attending: Internal Medicine | Admitting: Internal Medicine

## 2019-03-19 ENCOUNTER — Encounter (HOSPITAL_BASED_OUTPATIENT_CLINIC_OR_DEPARTMENT_OTHER): Payer: Medicare Other | Attending: Internal Medicine | Admitting: Internal Medicine

## 2019-03-19 DIAGNOSIS — E11622 Type 2 diabetes mellitus with other skin ulcer: Secondary | ICD-10-CM | POA: Insufficient documentation

## 2019-03-19 DIAGNOSIS — G43909 Migraine, unspecified, not intractable, without status migrainosus: Secondary | ICD-10-CM | POA: Insufficient documentation

## 2019-03-19 DIAGNOSIS — I509 Heart failure, unspecified: Secondary | ICD-10-CM | POA: Insufficient documentation

## 2019-03-19 DIAGNOSIS — E785 Hyperlipidemia, unspecified: Secondary | ICD-10-CM | POA: Diagnosis not present

## 2019-03-19 DIAGNOSIS — Z6837 Body mass index (BMI) 37.0-37.9, adult: Secondary | ICD-10-CM | POA: Insufficient documentation

## 2019-03-19 DIAGNOSIS — Z8249 Family history of ischemic heart disease and other diseases of the circulatory system: Secondary | ICD-10-CM | POA: Insufficient documentation

## 2019-03-19 DIAGNOSIS — I2510 Atherosclerotic heart disease of native coronary artery without angina pectoris: Secondary | ICD-10-CM | POA: Diagnosis not present

## 2019-03-19 DIAGNOSIS — Z87891 Personal history of nicotine dependence: Secondary | ICD-10-CM | POA: Insufficient documentation

## 2019-03-19 DIAGNOSIS — M1990 Unspecified osteoarthritis, unspecified site: Secondary | ICD-10-CM | POA: Insufficient documentation

## 2019-03-19 DIAGNOSIS — L89314 Pressure ulcer of right buttock, stage 4: Secondary | ICD-10-CM | POA: Diagnosis present

## 2019-03-19 DIAGNOSIS — I252 Old myocardial infarction: Secondary | ICD-10-CM | POA: Insufficient documentation

## 2019-03-19 DIAGNOSIS — G8221 Paraplegia, complete: Secondary | ICD-10-CM | POA: Insufficient documentation

## 2019-03-19 DIAGNOSIS — Z885 Allergy status to narcotic agent status: Secondary | ICD-10-CM | POA: Insufficient documentation

## 2019-03-19 DIAGNOSIS — K219 Gastro-esophageal reflux disease without esophagitis: Secondary | ICD-10-CM | POA: Insufficient documentation

## 2019-03-19 DIAGNOSIS — J449 Chronic obstructive pulmonary disease, unspecified: Secondary | ICD-10-CM | POA: Diagnosis not present

## 2019-03-19 DIAGNOSIS — Z881 Allergy status to other antibiotic agents status: Secondary | ICD-10-CM | POA: Insufficient documentation

## 2019-03-19 DIAGNOSIS — F319 Bipolar disorder, unspecified: Secondary | ICD-10-CM | POA: Insufficient documentation

## 2019-03-19 DIAGNOSIS — Z8349 Family history of other endocrine, nutritional and metabolic diseases: Secondary | ICD-10-CM | POA: Diagnosis not present

## 2019-03-19 DIAGNOSIS — I110 Hypertensive heart disease with heart failure: Secondary | ICD-10-CM | POA: Insufficient documentation

## 2019-03-19 DIAGNOSIS — M109 Gout, unspecified: Secondary | ICD-10-CM | POA: Diagnosis not present

## 2019-03-19 NOTE — Progress Notes (Signed)
[name], [name] ([id number]) Visit Report for 03/19/2019 Abuse/Suicide Risk Screen Details Patient Name: Date of Service: [name], [name] 03/19/2019 9:00 AM Medical Record [id number] Patient Account Number: [id number] Date of Birth/Sex: Treating RN: [birth year] (73 y.o. Elam Dutch Primary Care Belina Mandile: Vernell Morgans Other Clinician: Referring Renai Lopata: Treating Joncarlo Friberg/Extender:Robson, Forestine Chute, Felicita Gage Weeks in Treatment: 0 Abuse/Suicide Risk Screen Items Answer ABUSE RISK SCREEN: Has anyone close to you tried to hurt or harm you recentlyo No Do you feel uncomfortable with anyone in your familyo No Has anyone forced you do things that you didnt want to doo No Electronic Signature(s) Signed: 03/19/2019 6:13:08 PM By: Baruch Gouty RN, BSN Entered By: Baruch Gouty on 03/19/2019 09:43:30 -------------------------------------------------------------------------------- Activities of Daily Living Details Patient Name: Date of Service: [name], [name] 03/19/2019 9:00 AM Medical Record [id number] Patient Account Number: [id number] Date of Birth/Sex: Treating RN: [birth year] (73 y.o. Elam Dutch Primary Care Greidy Sherard: Vernell Morgans Other Clinician: Referring Matsue Strom: Treating Dhriti Fales/Extender:Robson, Forestine Chute, Felicita Gage Weeks in Treatment: 0 Activities of Daily Living Items Answer Activities of Daily Living (Please select one for each item) Drive Automobile Not Able Take Medications Need Assistance Use Telephone Completely Able Care for Appearance Need Assistance Use Toilet Need Assistance Bath / Shower Need Assistance Dress Self Need Assistance Feed Self Completely Able Walk Not Able Get In / Out Bed Need Assistance Housework Not Able Prepare Meals Not Able Handle Money Need Assistance Shop for Self Not Able Electronic Signature(s) Signed: 03/19/2019 6:13:08 PM By: Baruch Gouty RN, BSN Entered By: Baruch Gouty on  03/19/2019 09:44:20 -------------------------------------------------------------------------------- Education Screening Details Patient Name: Date of Service: [name], PORTALES. 03/19/2019 9:00 AM Medical Record [id number] Patient Account Number: [id number] Date of Birth/Sex: Treating RN: 04/02/1945 (73 y.o. Elam Dutch Primary Care Mae Cianci: Vernell Morgans Other Clinician: Referring Yared Barefoot: Treating Kaylanni Ezelle/Extender:Robson, Forestine Chute, Tresea Mall in Treatment: 0 Primary Learner Assessed: Patient Learning Preferences/Education Level/Primary Language Learning Preference: Explanation, Demonstration, Printed Material Highest Education Level: High School Preferred Language: English Cognitive Barrier Language Barrier: No Translator Needed: No Memory Deficit: No Emotional Barrier: No Cultural/Religious Beliefs Affecting Medical Care: No Physical Barrier Impaired Vision: Yes Glasses, reading Impaired Hearing: No Decreased Hand dexterity: No Knowledge/Comprehension Knowledge Level: High Comprehension Level: High Ability to understand written High instructions: Ability to understand verbal High instructions: Motivation Anxiety Level: Calm Cooperation: Cooperative Education Importance: Acknowledges Need Interest in Health Problems: Asks Questions Perception: Coherent Willingness to Engage in Self- High Management Activities: Readiness to Engage in Self- High Management Activities: Electronic Signature(s) Signed: 03/19/2019 6:13:08 PM By: Baruch Gouty RN, BSN Entered By: Baruch Gouty on 03/19/2019 09:45:16 -------------------------------------------------------------------------------- Fall Risk Assessment Details Patient Name: Date of Service: Sandra Gravel F. 03/19/2019 9:00 AM Medical Record [id number] Patient Account Number: [id number] Date of Birth/Sex: Treating RN: 1946-02-15 (73 y.o. Elam Dutch Primary Care Argel Pablo:  Vernell Morgans Other Clinician: Referring Marlette Curvin: Treating Kinley Dozier/Extender:Robson, Forestine Chute, Felicita Gage Weeks in Treatment: 0 Fall Risk Assessment Items Have you had 2 or more falls in the last 12 monthso 0 No Have you had any fall that resulted in injury in the last 12 monthso 0 No FALLS RISK SCREEN History of falling - immediate or within 3 months 0 No Secondary diagnosis (Do you have 2 or more medical diagnoseso) 15 Yes Ambulatory aid None/bed rest/wheelchair/nurse 0 Yes Crutches/cane/walker 0 No Furniture 0 No Intravenous therapy Access/Saline/Heparin Lock 0 No Weak (short steps with or without shuffle, stooped but able to lift head 0 No while walking, may seek support from furniture)  Impaired (short steps with shuffle, may have difficulty arising from chair, 0 No head down, impaired balance) Mental Status Oriented to own ability 0 Yes Overestimates or forgets limitations 0 No Risk Level: Low Risk Score: 15 Electronic Signature(s) Signed: 03/19/2019 6:13:08 PM By: Baruch Gouty RN, BSN Entered By: Baruch Gouty on 03/19/2019 09:45:38 -------------------------------------------------------------------------------- Foot Assessment Details Patient Name: Date of Service: Sandra Gravel F. 03/19/2019 9:00 AM Medical Record [id number] Patient Account Number: [id number] Date of Birth/Sex: Treating RN: 01/26/1946 (73 y.o. Elam Dutch Primary Care Anesha Hackert: Vernell Morgans Other Clinician: Referring Demian Maisel: Treating Vanellope Passmore/Extender:Robson, Forestine Chute, Felicita Gage Weeks in Treatment: 0 Foot Assessment Items Site Locations + = Sensation present, - = Sensation absent, C = Callus, U = Ulcer R = Redness, W = Warmth, M = Maceration, PU = Pre-ulcerative lesion F = Fissure, S = Swelling, D = Dryness Assessment Right: Left: Other Deformity: No No Prior Foot Ulcer: No No Prior Amputation: No No Charcot Joint: No No Ambulatory Status:  Non-ambulatory Assistance Device: Wheelchair Gait: Electronic Signature(s) Signed: 03/19/2019 6:13:08 PM By: Baruch Gouty RN, BSN Entered By: Baruch Gouty on 03/19/2019 09:46:37 -------------------------------------------------------------------------------- Nutrition Risk Screening Details Patient Name: Date of Service: [name], BENGTSON. 03/19/2019 9:00 AM Medical Record [id number] Patient Account Number: [id number] Date of Birth/Sex: Treating RN: 10-Sep-1945 (73 y.o. Elam Dutch Primary Care Elene Downum: Vernell Morgans Other Clinician: Referring Hildagarde Holleran: Treating Terrill Wauters/Extender:Robson, Forestine Chute, Felicita Gage Weeks in Treatment: 0 Height (in): 67 Weight (lbs): 240 Body Mass Index (BMI): 37.6 Nutrition Risk Screening Items Score Screening NUTRITION RISK SCREEN: I have an illness or condition that made me change the kind and/or 0 No amount of food I eat I eat fewer than two meals per day 0 No I eat few fruits and vegetables, or milk products 0 No I have three or more drinks of beer, liquor or wine almost every day 0 No I have tooth or mouth problems that make it hard for me to eat 0 No I don't always have enough money to buy the food I need 0 No I eat alone most of the time 1 Yes I take three or more different prescribed or over-the-counter drugs a day 0 No 0 No Without wanting to, I have lost or gained 10 pounds in the last six months I am not always physically able to shop, cook and/or feed myself 2 Yes Nutrition Protocols Good Risk Protocol Provide education on elevated blood sugars and Moderate Risk Protocol 0 impact on wound healing, as applicable High Risk Proctocol Risk Level: Moderate Risk Score: 3 Electronic Signature(s) Signed: 03/19/2019 6:13:08 PM By: Baruch Gouty RN, BSN Entered By: Baruch Gouty on 03/19/2019 09:46:22

## 2019-03-20 NOTE — Progress Notes (Signed)
[name], [name] ([id number]) Visit Report for 03/19/2019 Allergy List Details Patient Name: Date of Service: [name], [name] 03/19/2019 9:00 AM Medical Record [id number] Patient Account Number: [id number] Date of Birth/Sex: 03/13/1946 (73 y.o. Female) Treating RN: Baruch Gouty Primary Care Lance Galas: Vernell Morgans Other Clinician: Referring Marcey Persad: Treating Freddrick Gladson/Extender:Robson, Forestine Chute, Felicita Gage Weeks in Treatment: 0 Allergies Active Allergies codeine propranolol Invanz Rocephin Vancocin Allergy Notes Electronic Signature(s) Signed: 03/19/2019 6:13:08 PM By: Baruch Gouty RN, BSN Entered By: Baruch Gouty on 03/19/2019 09:19:53 -------------------------------------------------------------------------------- Arrival Information Details Patient Name: Date of Service: Sandra Gravel F. 03/19/2019 9:00 AM Medical Record [id number] Patient Account Number: [id number] Date of Birth/Sex: 1945/12/17 (73 y.o. Female) Treating RN: Baruch Gouty Primary Care Verona Hartshorn: Vernell Morgans Other Clinician: Referring Mostafa Yuan: Treating Foster Sonnier/Extender:Robson, Forestine Chute, Tresea Mall in Treatment: 0 Visit Information Patient Arrived: Wheel Chair Arrival Time: 09:16 Accompanied By: caregiver Transfer Assistance: Harrel Lemon Lift Patient Identification Verified: Yes Secondary Verification Process Completed: Yes Patient Requires Transmission-Based No Precautions: Patient Has Alerts: No Electronic Signature(s) Signed: 03/19/2019 6:13:08 PM By: Baruch Gouty RN, BSN Entered By: Baruch Gouty on 03/19/2019 09:16:51 -------------------------------------------------------------------------------- Clinic Level of Care Assessment Details Patient Name: Date of Service: [name], [name] 03/19/2019 9:00 AM Medical Record [id number] Patient Account Number: [id number] Date of Birth/Sex: 07-15-1945 (73 y.o. Female) Treating RN: Carlene Coria Primary  Care Maryann Mccall: Vernell Morgans Other Clinician: Referring Shirl Weir: Treating Gwyndolyn Guilford/Extender:Robson, Forestine Chute, [name] Weeks in Treatment: 0 Clinic Level of Care Assessment Items TOOL 1 Quantity Score X - Use when EandM and Procedure is performed on INITIAL visit 1 0 ASSESSMENTS - Nursing Assessment / Reassessment X - General Physical Exam (combine w/ comprehensive assessment (listed just below) 1 20 when performed on new pt. evals) X - Comprehensive Assessment (HX, ROS, Risk Assessments, Wounds Hx, etc.) 1 25 ASSESSMENTS - Wound and Skin Assessment / Reassessment []  - Dermatologic / Skin Assessment (not related to wound area) 0 ASSESSMENTS - Ostomy and/or Continence Assessment and Care []  - Incontinence Assessment and Management 0 []  - Ostomy Care Assessment and Management (repouching, etc.) 0 PROCESS - Coordination of Care X - Simple Patient / Family Education for ongoing care 1 15 []  - Complex (extensive) Patient / Family Education for ongoing care 0 X - Staff obtains Programmer, systems, Records, Test Results / Process Orders 1 10 []  - Staff telephones HHA, Nursing Homes / Clarify orders / etc 0 []  - Routine Transfer to another Facility (non-emergent condition) 0 []  - Routine Hospital Admission (non-emergent condition) 0 X - New Admissions / Biomedical engineer / Ordering NPWT, Apligraf, etc. 1 15 []  - Emergency Hospital Admission (emergent condition) 0 PROCESS - Special Needs []  - Pediatric / Minor Patient Management 0 []  - Isolation Patient Management 0 []  - Hearing / Language / Visual special needs 0 []  - Assessment of Community assistance (transportation, D/C planning, etc.) 0 []  - Additional assistance / Altered mentation 0 []  - Support Surface(s) Assessment (bed, cushion, seat, etc.) 0 INTERVENTIONS - Miscellaneous []  - External ear exam 0 []  - Patient Transfer (multiple staff / Civil Service fast streamer / Similar devices) 0 []  - Simple Staple / Suture removal (25 or less) 0 []  -  Complex Staple / Suture removal (26 or more) 0 []  - Hypo/Hyperglycemic Management (do not check if billed separately) 0 X - Ankle / Brachial Index (ABI) - do not check if billed separately 1 15 Has the patient been seen at the hospital within the last three years: Yes Total Score: 100 Level Of Care: New/Established - Level 3  Electronic Signature(s) Signed: 03/20/2019 11:01:46 AM By: Carlene Coria RN Entered By: Carlene Coria on 03/19/2019 11:34:53 -------------------------------------------------------------------------------- Encounter Discharge Information Details Patient Name: Date of Service: [name], [name] 03/19/2019 9:00 AM Medical Record [id number] Patient Account Number: [id number] Date of Birth/Sex: [birth year] (73 y.o. Female) Treating RN: Kela Millin Primary Care Kenley Troop: Vernell Morgans Other Clinician: Referring Shanaia Sievers: Treating Celestino Ackerman/Extender:Robson, Forestine Chute, [name] Weeks in Treatment: 0 Encounter Discharge Information Items Post Procedure Vitals Discharge Condition: Stable Temperature (F): 98.4 Ambulatory Status: Wheelchair Pulse (bpm): 75 Discharge Destination: Home Respiratory Rate (breaths/min): 18 Transportation: Other Blood Pressure (mmHg): 114/64 Accompanied By: caregiver Schedule Follow-up Appointment: Yes Clinical Summary of Care: Patient Declined Electronic Signature(s) Signed: 03/20/2019 4:54:21 PM By: Kela Millin Entered By: Kela Millin on 03/19/2019 10:59:51 -------------------------------------------------------------------------------- Lower Extremity Assessment Details Patient Name: Date of Service: [name], [name] 03/19/2019 9:00 AM Medical Record [id number] Patient Account Number: [id number] Date of Birth/Sex: [birth year] (73 y.o. Female) Treating RN: Baruch Gouty Primary Care Jes Costales: Vernell Morgans Other Clinician: Referring Layne Dilauro: Treating Collis Thede/Extender:Robson, Forestine Chute,  Felicita Gage Weeks in Treatment: 0 Electronic Signature(s) Signed: 03/19/2019 6:13:08 PM By: Baruch Gouty RN, BSN Entered By: Baruch Gouty on 03/19/2019 09:46:45 -------------------------------------------------------------------------------- Multi Wound Chart Details Patient Name: Date of Service: Sandra Romney. 03/19/2019 9:00 AM Medical Record [id number] Patient Account Number: [id number] Date of Birth/Sex: [birth year] (73 y.o. Female) Treating RN: Primary Care Davari Lopes: Vernell Morgans Other Clinician: Referring Lebaron Bautch: Treating Annielee Jemmott/Extender:Robson, Forestine Chute, [name] Weeks in Treatment: 0 Vital Signs Height(in): 67 Capillary Blood 110 Glucose(mg/dl): Weight(lbs): 240 Pulse(bpm): 75 Body Mass Index(BMI): 38 Blood Pressure(mmHg): 114/64 Temperature(F): 98.4 Respiratory 18 Rate(breaths/min): Photos: [1:No Photos] [N/A:N/A] Wound Location: [1:Right Ilium] [N/A:N/A] Wounding Event: [1:Pressure Injury] [N/A:N/A] Primary Etiology: [1:Pressure Ulcer] [N/A:N/A] Comorbid History: [1:Anemia, Chronic Obstructive Pulmonary Disease (COPD), Congestive Heart Failure, Hypertension, Myocardial Infarction, Type II Diabetes, Gout, Osteoarthritis, Paraplegia] [N/A:N/A] Date Acquired: [1:01/23/2019] [N/A:N/A] Weeks of Treatment: [1:0] [N/A:N/A] Wound Status: [1:Open] [N/A:N/A] Measurements L x W x D 7x6x3.4 [N/A:N/A] (cm) Area (cm) : [1:32.987] [N/A:N/A] Volume (cm) : [1:112.155] [N/A:N/A] Starting Position 1 11 (o'clock): Ending Position 1 [1:3] (o'clock): Maximum Distance 1 [1:6.7] (cm): Undermining: [1:Yes] [N/A:N/A] Classification: [1:Category/Stage IV] [N/A:N/A] Exudate Amount: [1:Large] [N/A:N/A] Exudate Type: [1:Serosanguineous] [N/A:N/A] Exudate Color: [1:red, brown] [N/A:N/A] Wound Margin: [1:Well defined, not attached] [N/A:N/A] Granulation Amount: [1:Large (67-100%)] [N/A:N/A] Granulation Quality: [1:Red, Hyper-granulation] [N/A:N/A] Necrotic  Amount: [1:None Present (0%)] [N/A:N/A] Exposed Structures: [1:Fat Layer (Subcutaneous Tissue) Exposed: Yes Bone: Yes Fascia: No Tendon: No Muscle: No Joint: No None] [N/A:N/A N/A] Treatment Notes Electronic Signature(s) Signed: 03/19/2019 6:32:44 PM By: Linton Ham MD Entered By: Linton Ham on 03/19/2019 10:32:00 -------------------------------------------------------------------------------- Multi-Disciplinary Care Plan Details Patient Name: Date of Service: Sandra Romney. 03/19/2019 9:00 AM Medical Record [id number] Patient Account Number: [id number] Date of Birth/Sex: 1945/10/08 (73 y.o. Female) Treating RN: Carlene Coria Primary Care Deangleo Passage: Vernell Morgans Other Clinician: Referring Erol Flanagin: Treating Rinnah Peppel/Extender:Robson, Forestine Chute, Felicita Gage Weeks in Treatment: 0 Active Inactive Abuse / Safety / Falls / Self Care Management Nursing Diagnoses: Potential for injury related to transfers Goals: Patient/caregiver will verbalize understanding of skin care regimen Date Initiated: 03/19/2019 Target Resolution Date: 04/19/2019 Goal Status: Active Patient/caregiver will verbalize/demonstrate measures taken to prevent injury and/or falls Date Initiated: 03/19/2019 Target Resolution Date: 04/19/2019 Goal Status: Active Interventions: Assess Activities of Daily Living upon admission and as needed Assess fall risk on admission and as needed Assess: immobility, friction, shearing, incontinence upon admission and as needed Assess impairment of mobility on admission and as needed per policy Assess personal safety and  home safety (as indicated) on admission and as needed Assess self care needs on admission and as needed Notes: Wound/Skin Impairment Nursing Diagnoses: Knowledge deficit related to ulceration/compromised skin integrity Goals: Patient/caregiver will verbalize understanding of skin care regimen Date Initiated: 03/19/2019 Target Resolution Date:  04/19/2019 Goal Status: Active Ulcer/skin breakdown will have a volume reduction of 30% by week 4 Date Initiated: 03/19/2019 Target Resolution Date: 04/19/2019 Goal Status: Active Interventions: Assess patient/caregiver ability to obtain necessary supplies Assess patient/caregiver ability to perform ulcer/skin care regimen upon admission and as needed Assess ulceration(s) every visit Notes: Electronic Signature(s) Signed: 03/20/2019 11:01:46 AM By: Carlene Coria RN Entered By: Carlene Coria on 03/19/2019 10:13:33 -------------------------------------------------------------------------------- Pain Assessment Details Patient Name: Date of Service: [name], [name] 03/19/2019 9:00 AM Medical Record [id number] Patient Account Number: [id number] Date of Birth/Sex: [birth year] (73 y.o. Female) Treating RN: Baruch Gouty Primary Care Payslie Mccaig: Vernell Morgans Other Clinician: Referring Parlee Amescua: Treating Dallana Mavity/Extender:Robson, Forestine Chute, Felicita Gage Weeks in Treatment: 0 Active Problems Location of Pain Severity and Description of Pain Patient Has Paino Yes Site Locations Rate the pain. Current Pain Level: 0 Pain Management and Medication Current Pain Management: Electronic Signature(s) Signed: 03/19/2019 6:13:08 PM By: Baruch Gouty RN, BSN Entered By: Baruch Gouty on 03/19/2019 09:50:32 -------------------------------------------------------------------------------- Patient/Caregiver Education Details Patient Name: [name], [name] 12/22/2020andnbsp9:00 Date of Service: AM Medical Record [id number] Number: Patient Account Number: [id number] Treating RN: 17-Apr-1945 (73 y.o. Carlene Coria Date of Birth/Gender: Female) Other Clinician: Primary Care Treating ARIZA, Paul Half Physician: Physician/Extender: Referring Physician: Jannette Spanner in Treatment: 0 Education Assessment Education Provided To: Patient Education Topics  Provided Pain: Methods: Explain/Verbal Responses: State content correctly Electronic Signature(s) Signed: 03/20/2019 11:01:46 AM By: Carlene Coria RN Entered By: Carlene Coria on 03/19/2019 10:13:44 -------------------------------------------------------------------------------- Wound Assessment Details Patient Name: Date of Service: [name], FRUGE. 03/19/2019 9:00 AM Medical Record [id number] Patient Account Number: [id number] Date of Birth/Sex: 12-02-1945 (73 y.o. Female) Treating RN: Baruch Gouty Primary Care Juanmanuel Marohl: Vernell Morgans Other Clinician: Referring Georg Ang: Treating Solina Heron/Extender:Robson, Forestine Chute, Felicita Gage Weeks in Treatment: 0 Wound Status Wound Number: 1 Primary Pressure Ulcer Etiology: Wound Location: Right Ilium Wound Open Wounding Event: Pressure Injury Status: Date Acquired: 01/23/2019 Comorbid Anemia, Chronic Obstructive Pulmonary Weeks Of Treatment: 0 History: Disease (COPD), Congestive Heart Failure, Clustered Wound: No Hypertension, Myocardial Infarction, Type II Diabetes, Gout, Osteoarthritis, Paraplegia Photos Wound Measurements Length: (cm) 7 % Reductio Width: (cm) 6 % Reductio Depth: (cm) 3.4 Epithelial Area: (cm) 32.987 Tunneling Volume: (cm) 112.155 Undermini Startin Ending Maximum n in Area: 0% n in Volume: 0% ization: None : No ng: Yes g Position (o'clock): 11 Position (o'clock): 3 Distance: (cm) 6.7 Wound Description Classification: Category/Stage IV Foul Odor Wound Margin: Well defined, not attached Slough/F Exudate Amount: Large Exudate Type: Serosanguineous Exudate Color: red, brown Wound Bed Granulation Amount: Large (67-100%) Granulation Quality: Red, Hyper-granulation Fascia Ex Necrotic Amount: None Present (0%) Fat Layer Tendon Ex Muscle Ex Joint Exp Bone Expo After Cleansing: No ibrino No Exposed Structure posed: No (Subcutaneous Tissue) Exposed: Yes posed: No posed: No osed:  No sed: Yes Treatment Notes Wound #1 (Right Ilium) 1. Cleanse With Wound Cleanser 2. Periwound Care Skin Prep 3. Primary Dressing Applied Other primary dressing (specifiy in notes) 4. Secondary Dressing ABD Pad Dry Gauze 5. Secured With Tape Notes wet to dry Electronic Signature(s) Signed: 03/20/2019 3:48:24 PM By: Mikeal Hawthorne EMT/HBOT Signed: 03/20/2019 4:50:49 PM By: Baruch Gouty RN, BSN Previous Signature: 03/19/2019 6:13:08 PM Version By: Baruch Gouty RN, BSN Entered By: Ronnald Ramp,  Dedrick on 03/20/2019 11:52:18 -------------------------------------------------------------------------------- Vitals Details Patient Name: Date of Service: [name], [name] 03/19/2019 9:00 AM Medical Record [id number] Patient Account Number: [id number] Date of Birth/Sex: 1945/05/23 (73 y.o. Female) Treating RN: Baruch Gouty Primary Care Erasmo Vertz: Vernell Morgans Other Clinician: Referring Harrington Jobe: Treating Anona Giovannini/Extender:Robson, Forestine Chute, [name] Weeks in Treatment: 0 Vital Signs Time Taken: 09:17 Temperature (F): 98.4 Height (in): 67 Pulse (bpm): 75 Source: Stated Respiratory Rate (breaths/min): 18 Weight (lbs): 240 Blood Pressure (mmHg): 114/64 Source: Stated Capillary Blood Glucose (mg/dl): 110 Body Mass Index (BMI): 37.6 Reference Range: 80 - 120 mg / dl Notes glucose per pt report Electronic Signature(s) Signed: 03/19/2019 6:13:08 PM By: Baruch Gouty RN, BSN Entered By: Baruch Gouty on 03/19/2019 09:20:16

## 2019-03-25 LAB — AEROBIC/ANAEROBIC CULTURE W GRAM STAIN (SURGICAL/DEEP WOUND)

## 2019-03-25 LAB — AEROBIC/ANAEROBIC CULTURE (SURGICAL/DEEP WOUND): Gram Stain: NONE SEEN

## 2019-03-25 NOTE — Progress Notes (Signed)
Sandra, Snyder ([id number]) Visit Report for 03/19/2019 Chief Complaint Document Details Patient Name: [name], [name] 03/19/2019 9:00 Date of Service: AM Medical Record [id number] Number: Patient Account Number: [id number] Treating RN: 1945/04/20 (73 y.o. Date of Birth/Sex: Female) Other Clinician: Primary Care Provider: Vernell Morgans Treating Linton Ham Referring Provider: Provider/Extender: Jannette Spanner in Treatment: 0 Information Obtained from: Patient Chief Complaint 03/19/2019; patient comes down for review of a pressure ulcer on her right buttock from [facility] in [place] Signature(s) Signed: 03/19/2019 6:32:44 PM By: Linton Ham MD Entered By: Linton Ham on 03/19/2019 10:32:25 -------------------------------------------------------------------------------- Debridement Details Patient Name: [name], [name] 03/19/2019 9:00 Date of Service: AM Medical Record [id number] Number: Patient Account Number: [id number] Treating RN: 1945/04/01 (73 y.o. Date of Birth/Sex: Female) Other Clinician: Primary Care Provider: Vernell Morgans Treating Linton Ham Referring Provider: Provider/Extender: Jannette Spanner in Treatment: 0 Debridement Performed for Wound #1 Right Ilium Assessment: Performed By: Physician Ricard Dillon., MD Debridement Type: Debridement Level of Consciousness (Pre- Awake and Alert procedure): Pre-procedure Verification/Time Out Taken: Yes - 10:20 Start Time: 10:20 Total Area Debrided (L x W): 0.5 (cm) x 0.5 (cm) = 0.25 (cm) Tissue and other material Tissue and other material Viable, Non-Viable, Bone, Subcutaneous debrided: Level: Skin/Subcutaneous Tissue/Muscle/Bone Debridement Description: Excisional Instrument: Rongeur Bleeding: Moderate Hemostasis Achieved: Pressure End Time: 10:24 Procedural Pain: 0 Post Procedural Pain: 0 Response to Treatment: Procedure was tolerated  well Level of Consciousness Awake and Alert (Post-procedure): Post Debridement Measurements of Total Wound Length: (cm) 7 Stage: Category/Stage IV Width: (cm) 6 Depth: (cm) 3.4 Volume: (cm) 112.155 Character of Wound/Ulcer Post Improved Debridement: Post Procedure Diagnosis Same as Pre-procedure Electronic Signature(s) Signed: 03/19/2019 6:32:44 PM By: Linton Ham MD Entered By: Linton Ham on 03/19/2019 10:44:31 -------------------------------------------------------------------------------- HPI Details Patient Name: Sandra Gravel F. 03/19/2019 9:00 Date of Service: AM Medical Record [id number] Number: Patient Account Number: [id number] Treating RN: 05-26-1945 (73 y.o. 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 This was apparently a vascular issue. 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 Apparently she developed a pressure ulcer in the right buttock sometime in late October. At 1 point this had a really necrotic surface although the facility is already managed to get this to clean up with Santyl. The patient spends most of her day in bed but she is sitting up at a right angle. She rarely gets in a wheelchair. She has a Foley catheter. She eats well. Past medical history; coronary artery disease, migraine headaches, congestive heart failure, gout, hypertension, type 2 diabetes which is well controlled with a recent hemoglobin A1c of 6.5, COPD, bipolar disorder, obesity, as noted longstanding lower thoracic  paralysis Electronic Signature(s) Signed: 03/19/2019 6:32:44 PM By: Linton Ham MD Entered By: Linton Ham on 03/19/2019 10:34:49 -------------------------------------------------------------------------------- Physical Exam Details Patient Name: [name], [name] 03/19/2019 9:00 Date of Service: AM Medical Record [id number] Number: Patient Account Number: [id number] Treating RN: 1945-06-20 (73 y.o. Date of Birth/Sex: Female) Other Clinician: Primary Care Provider: Vernell Morgans Treating Linton Ham Referring Provider: Provider/Extender: Vernell Morgans Weeks in Treatment: 0 Constitutional Sitting or standing Blood Pressure is within target  range for patient.. Pulse regular and within target range for patient.Marland Kitchen Respirations regular, non-labored and within target range.. Temperature is normal and within the target range for the patient.Marland Kitchen Appears in no distress. Eyes Conjunctivae clear. No discharge.no icterus. Respiratory work of breathing is normal. Bilateral breath sounds are clear and equal in all lobes with no wheezes, rales or rhonchi.. Cardiovascular Heart rhythm and rate regular, without murmur or gallop.. Gastrointestinal (GI) Obese no masses no tenderness. No liver or spleen enlargement. Genitourinary (GU) Foley catheter in place. Integumentary (Hair, Skin) No primary cutaneous issue is seen.. Neurological Insensate below I think the lower sacral levels. Paraparesis. Psychiatric appears at normal baseline. Notes Wound exam; the major wound here is over the right buttock. This probes over the right ischial tuberosity and part of the right ischial tuberosity is palpable with exposed bone. The tissue does not look too unhealthy. There is no real necrosis. No surrounding erythema. However there is considerable superior undermining. I am somewhat concerned about the skin on her lower sacrum and upper left buttock which also look like early pressure  injuries and I have told the patient and the care attendant who is with her Electronic Signature(s) Signed: 03/19/2019 6:32:44 PM By: Linton Ham MD Entered By: Linton Ham on 03/19/2019 10:37:05 -------------------------------------------------------------------------------- Physician Orders Details Patient Name: [name], [name] 03/19/2019 9:00 Date of Service: AM Medical Record [id number] Number: Patient Account Number: [id number] Treating RN: 1946/03/10 (73 y.o. Carlene Coria Date of Birth/Sex: Female) Other Clinician: Primary Care Provider: Vernell Morgans Treating Linton Ham Referring Provider: Provider/Extender: Jannette Spanner in Treatment: 0 Verbal / Phone Orders: No Diagnosis Coding Follow-up Appointments Return appointment in 3 weeks. Dressing Change Frequency Change dressing every day. Wound Cleansing Wound #1 Right Ilium Clean wound with Wound Cleanser Primary Wound Dressing Other: - hydrogel wet to dry Secondary Dressing ABD pad - or foam border Additional Orders / Instructions Other: - labs to be drawn at facility with results to wound center: CMP, CBC with diff, sed rate, and C reactive protein Laboratory Bacteria identified in Unspecified specimen by Anaerobe culture (MICRO) - non healing wound LOINC Code: 635-3 Convenience Name: Anerobic culture Bacteria identified in Tissue by Biopsy culture (MICRO) - non healing wound LOINC Code: [id number] Convenience Name: Biopsy specimen culture Electronic Signature(s) Signed: 03/19/2019 6:32:44 PM By: Linton Ham MD Signed: 03/20/2019 11:01:46 AM By: Carlene Coria RN Entered By: Carlene Coria on 03/19/2019 10:26:30 -------------------------------------------------------------------------------- Prescription 03/19/2019 Patient Name: [name] Provider: Linton Ham MD Date of Birth: 08/19/1945 NPI#: [id number] Sex: Female DEA#: [id number] Phone #: [phone] License #:  [id number] Patient Address: [facility] [address] [place], [address] [address] [place], [place] [address] [phone] Allergies codeine propranolol Invanz Rocephin Vancocin Provider's Orders Bacteria identified in Unspecified specimen by Anaerobe culture - non healing wound LOINC Code: [id number] Convenience Name: Anerobic culture Signature(s): Date(s): Electronic Signature(s) Signed: 03/19/2019 6:32:44 PM By: Linton Ham MD Signed: 03/20/2019 11:01:46 AM By: Carlene Coria RN Entered By: Carlene Coria on 03/19/2019 10:26:31 --------------------------------------------------------------------------------  Problem List Details Patient Name: [name], HALL. 03/19/2019 9:00 Date of Service: AM Medical Record [id number] Number: Patient Account Number: [id number] Treating RN: April 21, 1945 (73 y.o. Baruch Gouty Date of Birth/Sex: Female) Other Clinician: Primary Care Provider: Lucia Gaskins Referring Provider: Provider/Extender: Jannette Spanner in Treatment: 0 Active Problems ICD-10 Evaluated Encounter Code Description Active Date Today Diagnosis L89.314 Pressure ulcer of right buttock, stage 4 03/19/2019 No Yes  G82.21 Paraplegia, complete 03/19/2019 No Yes Inactive Problems Resolved Problems Electronic Signature(s) Signed: 03/19/2019 6:32:44 PM By: Linton Ham MD Entered By: Linton Ham on 03/19/2019 10:31:44 -------------------------------------------------------------------------------- Progress Note Details Patient Name: [name], [name] 03/19/2019 9:00 Date of Service: AM Medical Record [id number] Number: Patient Account Number: [id number] Treating RN: March 24, 1946 (73 y.o. 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 This was apparently a vascular issue. 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 Apparently she developed a pressure ulcer in the right buttock sometime in late October. At 1 point this had a really necrotic surface although the facility is already managed to get this to clean up with Santyl. The patient spends most of her day in bed but she is sitting up at a right angle. She rarely gets in a wheelchair. She has a Foley catheter. She eats well. Past medical history; coronary artery disease, migraine headaches, congestive heart failure, gout, hypertension, type 2 diabetes which is well controlled with a recent hemoglobin A1c of 6.5, COPD, bipolar disorder, obesity, as noted longstanding lower thoracic paralysis Patient History Information obtained from Patient. Allergies codeine, propranolol, Invanz, Rocephin, Vancocin Family History Heart Disease - Mother,Siblings, No family history of Cancer, Diabetes, Hereditary Spherocytosis, Hypertension, Kidney Disease, Lung Disease, Seizures, Stroke, Thyroid Problems, Tuberculosis. Social History Former smoker - quit long time age, Marital Status - Widowed, Alcohol Use - Never, Drug Use - Prior History - TCH, Caffeine Use - Moderate. 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 Blood sugar is tested. Hospitalization/Surgery History - hysterectomy. - back surgery x4. - left bunionectomy. - appendectomy. Medical And Surgical History Notes Constitutional Symptoms (General Health) vitamin D deficiency, morbid obesity Eyes dry eye syndrome Cardiovascular hyperlipidemia Gastrointestinal IBS, spastic colon, GERD Endocrine hypothyroidism Genitourinary freq UTIs, CDK stage 2, nephritis Psychiatric depression, bipolar Review of Systems (ROS) Constitutional Symptoms (General Health) Denies complaints or symptoms of Fatigue, Fever, Chills, Marked Weight Change. Eyes Complains or has symptoms of Glasses / Contacts - reading. Denies complaints or symptoms of Dry Eyes, Vision Changes. Ear/Nose/Mouth/Throat Denies complaints or symptoms of Chronic sinus problems or rhinitis. Respiratory Denies complaints or symptoms of Chronic or frequent coughs, Shortness of Breath. Cardiovascular Denies complaints or symptoms of Chest pain. Endocrine Denies complaints or symptoms of Heat/cold intolerance. Integumentary (Skin) Complains or has symptoms of Wounds - right ischium. Musculoskeletal Complains or has symptoms of Muscle Weakness. Denies complaints or symptoms of Muscle Pain. Neurologic Complains or has symptoms  of Numbness/parasthesias. Psychiatric Denies complaints or symptoms of Claustrophobia, Suicidal. Objective Constitutional Sitting  or standing Blood Pressure is within target range for patient.. Pulse regular and within target range for patient.Marland Kitchen Respirations regular, non-labored and within target range.. Temperature is normal and within the target range for the patient.Marland Kitchen Appears in no distress. Vitals Time Taken: 9:17 AM, Height: 67 in, Source: Stated, Weight: 240 lbs, Source: Stated, BMI: 37.6, Temperature: 98.4 F, Pulse: 75 bpm, Respiratory Rate: 18 breaths/min, Blood Pressure: 114/64 mmHg, Capillary Blood Glucose: 110 mg/dl. General Notes: glucose per pt report Eyes Conjunctivae clear. No discharge.no icterus. Respiratory work of breathing is normal. Bilateral breath sounds are clear and equal in all lobes with no wheezes, rales or rhonchi.. Cardiovascular Heart rhythm and rate regular, without murmur or gallop.. Gastrointestinal (GI) Obese no masses no tenderness. No liver or spleen enlargement. Genitourinary (GU) Foley catheter in place. Neurological Insensate below I think the lower sacral levels. Paraparesis. Psychiatric appears at normal baseline. General Notes: Wound exam; the major wound here is over the right buttock. This probes over the right ischial tuberosity and part of the right ischial tuberosity is palpable with exposed bone. The tissue does not look too unhealthy. There is no real necrosis. No surrounding erythema. However there is considerable superior undermining. ooI am somewhat concerned about the skin on her lower sacrum and upper left buttock which also look like early pressure injuries and I have told the patient and the care attendant who is with her Integumentary (Hair, Skin) No primary cutaneous issue is seen.. Wound #1 status is Open. Original cause of wound was Pressure Injury. The wound is located on the Right Ilium. The wound measures 7cm length x 6cm width x 3.4cm depth; 32.987cm^2 area and 112.155cm^3 volume. There is bone and Fat Layer (Subcutaneous Tissue) Exposed exposed.  There is no tunneling noted, however, there is undermining starting at 11:00 and ending at 3:00 with a maximum distance of 6.7cm. There is a large amount of serosanguineous drainage noted. The wound margin is well defined and not attached to the wound base. There is large (67-100%) red, hyper - granulation within the wound bed. There is no necrotic tissue within the wound bed. Assessment Active Problems ICD-10 Pressure ulcer of right buttock, stage 4 Paraplegia, complete Procedures Wound #1 Pre-procedure diagnosis of Wound #1 is a Pressure Ulcer located on the Right Ilium . There was a Excisional Skin/Subcutaneous Tissue/Muscle/Bone Debridement with a total area of 0.25 sq cm performed by Ricard Dillon., MD. With the following instrument(s): Rongeur to remove Viable and Non-Viable tissue/material. Material removed includes Bone,Subcutaneous Tissue and. No specimens were taken. A time out was conducted at 10:20, prior to the start of the procedure. A Moderate amount of bleeding was controlled with Pressure. The procedure was tolerated well with a pain level of 0 throughout and a pain level of 0 following the procedure. Post Debridement Measurements: 7cm length x 6cm width x 3.4cm depth; 112.155cm^3 volume. Post debridement Stage noted as Category/Stage IV. Character of Wound/Ulcer Post Debridement is improved. Post procedure Diagnosis Wound #1: Same as Pre-Procedure Plan Follow-up Appointments: Return appointment in 3 weeks. Dressing Change Frequency: Change dressing every day. Wound Cleansing: Wound #1 Right Ilium: Clean wound with Wound Cleanser Primary Wound Dressing: Other: - hydrogel wet to dry Secondary Dressing: ABD pad - or foam border Additional Orders / Instructions: Other: - labs to be drawn at facility with results to wound center: CMP, CBC with diff, sed rate, and C reactive protein Laboratory  ordered were: Anerobic culture bone sacrum - non healing wound, Biopsy  specimen culture bone - non healing wound 1. I have obtained I hope pieces of bone for pathology and culture. 2. The tissue actually looks quite healthy. I see no reason for Santyl at this point. We will use hydrogel wet-to-dry. 3. CMP, CBC, sedimentation rate and quantitative CRP 4. X-ray of the pelvis. 5. If there is no evidence of infection and assuming the patient is willing to be compliant with pressure relief instructions then we can apply a wound VAC. If the patient has underlying infection then we are going to have to treat that before considering the wound VAC. 6. Apparently the patient sits at a 90 degree angle in bed and sleeps in this position as well. I have gone over this with her. Electronic Signature(s) Signed: 03/21/2019 12:25:31 PM By: Linton Ham MD Signed: 03/25/2019 6:10:06 PM By: Levan Hurst RN, BSN Previous Signature: 03/19/2019 6:32:44 PM Version By: Linton Ham MD Entered By: Levan Hurst on 03/20/2019 10:09:20 -------------------------------------------------------------------------------- HxROS Details Patient Name: [name], [name] 03/19/2019 9:00 Date of Service: AM Medical Record [id number] Number: Patient Account Number: [id number] Treating RN: Dec 19, 1945 (73 y.o. Baruch Gouty Date of Birth/Sex: Female) Other Clinician: Primary Care Provider: Vernell Morgans Treating Linton Ham Referring Provider: Provider/Extender: Jannette Spanner in Treatment: 0 Information Obtained From Patient Constitutional Symptoms (General Health) Complaints and Symptoms: Negative for: Fatigue; Fever; Chills; Marked Weight Change Medical History: Past Medical History Notes: vitamin D deficiency, morbid obesity Eyes Complaints and Symptoms: Positive for: Glasses / Contacts - reading Negative for: Dry Eyes; Vision Changes Medical History: Past Medical History Notes: dry eye syndrome Ear/Nose/Mouth/Throat Complaints and Symptoms: Negative for:  Chronic sinus problems or rhinitis Respiratory Complaints and Symptoms: Negative for: Chronic or frequent coughs; Shortness of Breath Medical History: Positive for: Chronic Obstructive Pulmonary Disease (COPD) Cardiovascular Complaints and Symptoms: Negative for: Chest pain Medical History: Positive for: Congestive Heart Failure; Hypertension; Myocardial Infarction Past Medical History Notes: hyperlipidemia Endocrine Complaints and Symptoms: Negative for: Heat/cold intolerance Medical History: Positive for: Type II Diabetes Negative for: Type I Diabetes Past Medical History Notes: hypothyroidism Treated with: Oral agents Blood sugar tested every day: Yes Tested : daily Integumentary (Skin) Complaints and Symptoms: Positive for: Wounds - right ischium Medical History: Negative for: History of Burn Musculoskeletal Complaints and Symptoms: Positive for: Muscle Weakness Negative for: Muscle Pain Medical History: Positive for: Gout; Osteoarthritis Neurologic Complaints and Symptoms: Positive for: Numbness/parasthesias Medical History: Positive for: 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 Negative for: Dementia; Neuropathy; Quadriplegia; Seizure Disorder Psychiatric Complaints and Symptoms: Negative for: Claustrophobia; Suicidal Medical History: Negative for: Anorexia/bulimia; Confinement Anxiety Past Medical History Notes: depression, bipolar Hematologic/Lymphatic Medical History: Positive for: Anemia Gastrointestinal Medical History: Past Medical History Notes: IBS, spastic colon, GERD Genitourinary Medical History: Past Medical History Notes: freq UTIs, CDK stage 2, nephritis Immunological Oncologic Medical History: Negative for: Received Chemotherapy; Received Radiation Immunizations Pneumococcal Vaccine: Received Pneumococcal Vaccination: Yes Implantable Devices None Hospitalization / Surgery History Type of Hospitalization/Surgery hysterectomy back  surgery x4 left bunionectomy appendectomy Family and Social History Cancer: No; Diabetes: No; Heart Disease: Yes - Mother,Siblings; Hereditary Spherocytosis: No; Hypertension: No; Kidney Disease: No; Lung Disease: No; Seizures: No; Stroke: No; Thyroid Problems: No; Tuberculosis: No; Former smoker - quit long time age; Marital Status - Widowed; Alcohol Use: Never; Drug Use: Prior History - TCH; Caffeine Use: Moderate; Financial Concerns: No; Food, Clothing or Shelter Needs: No; Support System Lacking: No; Transportation Concerns: No Electronic Signature(s) Signed: 03/19/2019  6:13:08 PM By: Baruch Gouty RN, BSN Signed: 03/19/2019 6:32:44 PM By: Linton Ham MD Entered By: Baruch Gouty on 03/19/2019 09:43:21 -------------------------------------------------------------------------------- SuperBill Details Patient Name: Date of Service: [name], PERRON. 03/19/2019 Medical Record [id number] Patient Account Number: [id number] Date of Birth/Sex: Treating RN: June 07, 1945 (73 y.o. Female) Primary Care Provider: Vernell Morgans Other Clinician: Referring Provider: Treating Provider/Extender:Prentiss Polio, Forestine Chute, FERNANDO Weeks in Treatment: 0 Diagnosis Coding ICD-10 Codes Code Description L89.314 Pressure ulcer of right buttock, stage 4 G82.21 Paraplegia, complete Facility Procedures CPT4 Code: [id number] Description: [id number] - WOUND CARE VISIT-LEV 3 EST PT Modifier: 25 Quantity: 1 CPT4 Code: [id number] Description: [id number] - DEB BONE 20 SQ CM/< ICD-10 Diagnosis Description L89.314 Pressure ulcer of right buttock, stage 4 Modifier: Quantity: 1 Physician Procedures CPT4: Code Description Modifier Quantity [id number] - WC PHYS LEVEL 4 - NEW PT 25 1 ICD-10 Diagnosis Description L89.314 Pressure ulcer of right buttock, stage 4 G82.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

## 2019-04-19 ENCOUNTER — Other Ambulatory Visit: Payer: Self-pay

## 2019-04-19 ENCOUNTER — Encounter (HOSPITAL_BASED_OUTPATIENT_CLINIC_OR_DEPARTMENT_OTHER): Payer: Medicare Other | Attending: Internal Medicine | Admitting: Internal Medicine

## 2019-04-19 DIAGNOSIS — M1990 Unspecified osteoarthritis, unspecified site: Secondary | ICD-10-CM | POA: Diagnosis not present

## 2019-04-19 DIAGNOSIS — Z6837 Body mass index (BMI) 37.0-37.9, adult: Secondary | ICD-10-CM | POA: Diagnosis not present

## 2019-04-19 DIAGNOSIS — I509 Heart failure, unspecified: Secondary | ICD-10-CM | POA: Insufficient documentation

## 2019-04-19 DIAGNOSIS — J449 Chronic obstructive pulmonary disease, unspecified: Secondary | ICD-10-CM | POA: Insufficient documentation

## 2019-04-19 DIAGNOSIS — E11622 Type 2 diabetes mellitus with other skin ulcer: Secondary | ICD-10-CM | POA: Insufficient documentation

## 2019-04-19 DIAGNOSIS — I110 Hypertensive heart disease with heart failure: Secondary | ICD-10-CM | POA: Insufficient documentation

## 2019-04-19 DIAGNOSIS — F319 Bipolar disorder, unspecified: Secondary | ICD-10-CM | POA: Insufficient documentation

## 2019-04-19 DIAGNOSIS — E669 Obesity, unspecified: Secondary | ICD-10-CM | POA: Insufficient documentation

## 2019-04-19 DIAGNOSIS — M109 Gout, unspecified: Secondary | ICD-10-CM | POA: Diagnosis not present

## 2019-04-19 DIAGNOSIS — I2510 Atherosclerotic heart disease of native coronary artery without angina pectoris: Secondary | ICD-10-CM | POA: Diagnosis not present

## 2019-04-19 DIAGNOSIS — I252 Old myocardial infarction: Secondary | ICD-10-CM | POA: Diagnosis not present

## 2019-04-19 DIAGNOSIS — G8221 Paraplegia, complete: Secondary | ICD-10-CM | POA: Diagnosis not present

## 2019-04-19 DIAGNOSIS — L89314 Pressure ulcer of right buttock, stage 4: Secondary | ICD-10-CM | POA: Insufficient documentation

## 2019-04-19 DIAGNOSIS — E1151 Type 2 diabetes mellitus with diabetic peripheral angiopathy without gangrene: Secondary | ICD-10-CM | POA: Diagnosis not present

## 2019-04-19 NOTE — Progress Notes (Addendum)
[name] ([id number]) Visit Report for 04/19/2019 HPI Details Patient Name: Date of Service: [name], [name] 04/19/2019 10:45 AM Medical Record [id number] Patient Account Number: [id number] Date of Birth/Sex: Treating RN: [birth year] (74 y.o. 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 This was apparently a vascular issue. 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 Apparently she developed a pressure ulcer in the right buttock sometime in late October. At 1 point this had a really necrotic surface although the facility is already managed to get this to clean up with Santyl. The patient spends most of her day in bed but she is sitting up at a right angle. She rarely gets in a wheelchair. She has a Foley catheter. She eats well. Past medical history; coronary artery disease, migraine headaches, congestive heart failure, gout, hypertension, type 2 diabetes which is well controlled with a recent hemoglobin A1c of 6.5, COPD, bipolar disorder, obesity, as noted longstanding lower thoracic paralysis 04/19/2019; 1 month follow-up. I am not sure why this was so delayed. The patient had an x-ray done at the facility that was negative for osteomyelitis bone that I cultured last time showed MRSA but negative for osteomyelitis on pathology Lab work that was done at the facility showed a white count of 10.2 with a normal differential. Comprehensive metabolic panel was normal except for an albumin of 3. Sedimentation rate was 40 C-reactive protein however was very high at 106. I am  uncertain whether there could be something else contributing to the elevated inflammatory markers. We gave her 2 weeks of Zyvox is my understanding at 600 twice daily. They have been using wet-to-dry dressings Electronic Signature(s) Signed: 04/25/2019 1:17:15 PM By: Sandra Ham MD Signed: 05/23/2019 2:24:55 PM By: Sandra Hurst RN, BSN Previous Signature: 04/19/2019 5:37:40 PM Version By: Sandra Ham MD Entered By: [name] on 04/24/2019 10:38:03 -------------------------------------------------------------------------------- Physical Exam Details Patient Name: Date of Service: [name], [name] 04/19/2019 10:45 AM Medical Record [id number] Patient Account Number: [id number] Date of Birth/Sex: Treating RN: 19-Jun-1945 (74 y.o. F) Primary Care Provider: Vernell Snyder Other Clinician: Referring Provider: Treating Provider/Extender:[name], [name], [name] Weeks in Treatment: 4 Constitutional Sitting or standing Blood Pressure is within target range for patient.. Pulse regular and within target range for patient.Marland Kitchen Respirations regular, non-labored and within target range.. Temperature is normal and within the target range for the patient.Marland Kitchen Appears in no distress. Respiratory work of breathing is normal. Genitourinary (GU) Patient has a Foley catheter. Notes Wound exam; this is over her right buttock. This probes over the right ischial tuberosity with extensive undermining superiorly. As opposed to last time I do not feel any bone palpable over the ischial tuberosity itself. Tissue does not look too unhealthy. There is no surrounding erythema no purulence Electronic Signature(s) Signed: 04/19/2019 5:37:40 PM By: Sandra Ham MD Entered By: [name] on 04/19/2019 12:38:53 -------------------------------------------------------------------------------- Physician Orders Details Patient Name: Date of Service: [name], [name]. 04/19/2019 10:45  AM Medical Record [id number] Patient Account Number: [id number] Date of Birth/Sex: Treating RN: [birth year] (74 y.o. [name] Primary Care Provider: Vernell Snyder Other Clinician: Referring Provider: Treating Provider/Extender:[name], [name], [name] Weeks in Treatment: 4  Verbal / Phone Orders: No Diagnosis Coding ICD-10 Coding Code Description L89.314 Pressure ulcer of right buttock, stage 4 G82.21 Paraplegia, complete Follow-up Appointments Return Appointment in 2 weeks. Dressing Change Frequency Change dressing every day. Wound Cleansing Wound #1 Right Ilium Clean wound with Wound Cleanser Primary Wound Dressing Other: - Dakin's 1/4 solution, moisten kerlix and lightly pack Secondary Dressing ABD pad - or foam border Electronic Signature(s) Signed: 04/19/2019 5:37:40 PM By: Sandra Ham MD Signed: 04/19/2019 5:39:40 PM By: [name] Entered By: [name] on 04/19/2019 12:01:52 -------------------------------------------------------------------------------- Problem List Details Patient Name: Date of Service: [name], [name]. 04/19/2019 10:45 AM Medical Record [id number] Patient Account Number: [id number] Date of Birth/Sex: Treating RN: 12-21-1945 (74 y.o. [name] Primary Care Provider: Vernell Snyder Other Clinician: Referring Provider: Treating Provider/Extender:[name], [name], [name] Weeks in Treatment: 4 Active Problems ICD-10 Evaluated Encounter Code Description Active Date Today Diagnosis L89.314 Pressure ulcer of right buttock, stage 4 03/19/2019 No Yes G82.21 Paraplegia, complete 03/19/2019 No Yes Inactive Problems Resolved Problems Electronic Signature(s) Signed: 04/19/2019 5:37:40 PM By: Sandra Ham MD Entered By: [name] on 04/19/2019 12:32:12 -------------------------------------------------------------------------------- Progress Note Details Patient Name: Date of  Service: [name], [name]. 04/19/2019 10:45 AM Medical Record [id number] Patient Account Number: [id number] Date of Birth/Sex: Treating RN: 09-Mar-1946 (74 y.o. 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 This was apparently a vascular issue. 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 Apparently she developed a pressure ulcer in the right buttock sometime in late October. At 1 point this had a really necrotic surface although the facility is already managed to get this to clean up with Santyl. The patient spends most of her day in bed but she is sitting up at a right angle. She rarely gets in a wheelchair. She has a Foley catheter. She eats well. Past medical history; coronary artery disease, migraine headaches, congestive heart failure, gout, hypertension, type 2 diabetes which is well controlled with a recent hemoglobin A1c of 6.5, COPD, bipolar disorder, obesity, as noted longstanding lower thoracic paralysis 04/19/2019; 1 month follow-up. I am not sure why this was so delayed. The patient had an x-ray done at the facility that was negative for osteomyelitis bone that I cultured last time showed MRSA but negative for osteomyelitis on pathology Lab work that was done at the facility showed a white count of 10.2 with a normal differential. Comprehensive metabolic panel was normal except for an albumin of 3. Sedimentation rate was 40 C-reactive protein however was very high at 106. I am uncertain whether there could be something else contributing to the elevated  inflammatory markers. We gave her 2 weeks of Zyvox is my understanding at 600 twice daily. They have been using wet-to-dry dressings Objective Constitutional Sitting or standing Blood Pressure is within target range for patient.. Pulse regular and within target range for patient.Marland Kitchen Respirations regular, non-labored and within target range.. Temperature is normal and within the target range for the patient.Marland Kitchen Appears in no distress. Vitals Time Taken: 11:46 AM, Height: 67 in, Weight: 240 lbs, BMI: 37.6, Temperature: 97.8 F, Pulse: 75 bpm, Respiratory Rate: 18 breaths/min, Blood Pressure: 134/61 mmHg. Respiratory work  of breathing is normal. Genitourinary (GU) Patient has a Foley catheter. General Notes: Wound exam; this is over her right buttock. This probes over the right ischial tuberosity with extensive undermining superiorly. As opposed to last time I do not feel any bone palpable over the ischial tuberosity itself. Tissue does not look too unhealthy. There is no surrounding erythema no purulence Integumentary (Hair, Skin) Wound #1 status is Open. Original cause of wound was Pressure Injury. The wound is located on the Right Ilium. The wound measures 4cm length x 4.5cm width x 4cm depth; 14.137cm^2 area and 56.549cm^3 volume. There is bone and Fat Layer (Subcutaneous Tissue) Exposed exposed. There is no tunneling noted, however, there is undermining starting at 12:00 and ending at 3:00 with a maximum distance of 7cm. There is a large amount of serosanguineous drainage noted. The wound margin is well defined and not attached to the wound base. There is large (67-100%) red, hyper - granulation within the wound bed. There is a small (1-33%) amount of necrotic tissue within the wound bed including Adherent Slough. Assessment Active Problems ICD-10 Pressure ulcer of right buttock, stage 4 Paraplegia, complete Plan Follow-up Appointments: Return Appointment in 2 weeks. Dressing Change  Frequency: Change dressing every day. Wound Cleansing: Wound #1 Right Ilium: Clean wound with Wound Cleanser Primary Wound Dressing: Other: - Dakin's 1/4 solution, moisten kerlix and lightly pack Secondary Dressing: ABD pad - or foam border #1 I change the primary dressing to quarter strength Dakin's wet-to-dry 2. My original thought on this was a wound VAC although this will not be an easy thing to do in a nursing home and then there is the incontinence issues especially large fecal incontinence today. She does have a Foley catheter. 3. Her inflammatory markers were high he had the bone biopsy I did was negative and the plain x-ray was negative 4. If I can get the Dakin's to clean up the surface of the bed I might give the wound VAC a trial to see if it can be handled in the facility [[place]} #5 an albumin of 3 is indicative of protein malnutrition. I will need to ask them about protein supplements Electronic Signature(s) Signed: 04/25/2019 1:17:15 PM By: Sandra Ham MD Signed: 05/23/2019 2:24:55 PM By: Sandra Hurst RN, BSN Previous Signature: 04/19/2019 5:37:40 PM Version By: Sandra Ham MD Entered By: [name] on 04/24/2019 10:38:14 -------------------------------------------------------------------------------- SuperBill Details Patient Name: Date of Service: KENDRIANNA, ARBANAS. 04/19/2019 Medical Record [id number] Patient Account Number: [id number] Date of Birth/Sex: Treating RN: [birth year] (74 y.o. F) Primary Care Provider: Vernell Snyder Other Clinician: Referring Provider: Treating Provider/Extender:Anjela Cassara, [name], [name] Weeks in Treatment: 4 Diagnosis Coding ICD-10 Codes Code Description L89.314 Pressure ulcer of right buttock, stage 4 G82.21 Paraplegia, complete Facility Procedures CPT4 Code: [id number] Description: [id number] - WOUND CARE VISIT-LEV 3 EST PT Modifier: Quantity: 1 Physician Procedures CPT4 Code: [id number] Description:  [id number] - WC PHYS LEVEL 3 - EST PT ICD-10 Diagnosis Description L89.314 Pressure ulcer of right buttock, stage 4 G82.21 Paraplegia, complete Modifier: Quantity: 1 Electronic Signature(s) Signed: 04/19/2019 5:37:40 PM By: Sandra Ham MD Signed: 04/19/2019 5:39:40 PM By: [name] Entered By: [name] on 04/19/2019 12:44:53

## 2019-04-22 NOTE — Progress Notes (Addendum)
SEQUOYA, MOES ([id number]) Visit Report for 04/19/2019 Arrival Information Details Patient Name: Date of Service: [name], [name] 04/19/2019 10:45 AM Medical Record [id number] Patient Account Number: [id number] Date of Birth/Sex: Treating RN: [birth year] (74 y.o. [name] Primary Care [name]: [name] Other Clinician: Referring [name]: Treating [name]/Extender:[name], [name] Weeks in Treatment: 4 Visit Information History Since Last Visit All ordered tests and consults were completed: No Patient Arrived: Wheel Chair Added or deleted any medications: No Arrival Time: 11:45 Any new allergies or adverse reactions: No Accompanied By: caregiver Had a fall or experienced change in No activities of daily living that may affect Transfer Assistance: None risk of falls: Patient Identification Verified: Yes Signs or symptoms of abuse/neglect since last No Secondary Verification Process Completed: Yes visito Patient Requires Transmission-Based No Hospitalized since last visit: No Precautions: Implantable device outside of the clinic excluding No Patient Has Alerts: No cellular tissue based products placed in the center since last visit: Has Dressing in Place as Prescribed: Yes Pain Present Now: No Electronic Signature(s) Signed: 04/22/2019 6:01:30 PM By: Sandra Coria RN Entered By: [name] on 04/19/2019 11:46:24 -------------------------------------------------------------------------------- Clinic Level of Care Assessment Details Patient Name: Date of Service: [name], [name] 04/19/2019 10:45 AM Medical Record [id number] Patient Account Number: [id number] Date of Birth/Sex: Treating RN: [name] (74 y.o. [name] Primary Care [name]: [name] Other Clinician: Referring [name]: Treating [name]/Extender:[name], [name] Weeks in Treatment: 4 Clinic Level of Care Assessment  Items TOOL 4 Quantity Score X - Use when only an EandM is performed on FOLLOW-UP visit 1 0 ASSESSMENTS - Nursing Assessment / Reassessment X - Reassessment of Co-morbidities (includes updates in patient status) 1 10 X - Reassessment of Adherence to Treatment Plan 1 5 ASSESSMENTS - Wound and Skin Assessment / Reassessment X - Simple Wound Assessment / Reassessment - one wound 1 5 []  - Complex Wound Assessment / Reassessment - multiple wounds 0 []  - Dermatologic / Skin Assessment (not related to wound area) 0 ASSESSMENTS - Focused Assessment []  - Circumferential Edema Measurements - multi extremities 0 []  - Nutritional Assessment / Counseling / Intervention 0 []  - Lower Extremity Assessment (monofilament, tuning fork, pulses) 0 []  - Peripheral Arterial Disease Assessment (using hand held doppler) 0 ASSESSMENTS - Ostomy and/or Continence Assessment and Care []  - Incontinence Assessment and Management 0 []  - Ostomy Care Assessment and Management (repouching, etc.) 0 PROCESS - Coordination of Care X - Simple Patient / Family Education for ongoing care 1 15 []  - Complex (extensive) Patient / Family Education for ongoing care 0 X - Staff obtains Programmer, systems, Records, Test Results / Process Orders 1 10 X - Staff telephones HHA, Nursing Homes / Clarify orders / etc 1 10 []  - Routine Transfer to another Facility (non-emergent condition) 0 []  - Routine Hospital Admission (non-emergent condition) 0 []  - New Admissions / Biomedical engineer / Ordering NPWT, Apligraf, etc. 0 []  - Emergency Hospital Admission (emergent condition) 0 X - Simple Discharge Coordination 1 10 []  - Complex (extensive) Discharge Coordination 0 PROCESS - Special Needs []  - Pediatric / Minor Patient Management 0 []  - Isolation Patient Management 0 []  - Hearing / Language / Visual special needs 0 []  - Assessment of Community assistance (transportation, D/C planning, etc.) 0 []  - Additional assistance / Altered mentation  0 []  - Support Surface(s) Assessment (bed, cushion, seat, etc.) 0 INTERVENTIONS - Wound Cleansing / Measurement X - Simple Wound Cleansing - one wound 1 5 []  - Complex Wound  Cleansing - multiple wounds 0 X - Wound Imaging (photographs - any number of wounds) 1 5 []  - Wound Tracing (instead of photographs) 0 X - Simple Wound Measurement - one wound 1 5 []  - Complex Wound Measurement - multiple wounds 0 INTERVENTIONS - Wound Dressings []  - Small Wound Dressing one or multiple wounds 0 X - Medium Wound Dressing one or multiple wounds 1 15 []  - Large Wound Dressing one or multiple wounds 0 X - Application of Medications - topical 1 5 []  - Application of Medications - injection 0 INTERVENTIONS - Miscellaneous []  - External ear exam 0 []  - Specimen Collection (cultures, biopsies, blood, body fluids, etc.) 0 []  - Specimen(s) / Culture(s) sent or taken to Lab for analysis 0 []  - Patient Transfer (multiple staff / Civil Service fast streamer / Similar devices) 0 []  - Simple Staple / Suture removal (25 or less) 0 []  - Complex Staple / Suture removal (26 or more) 0 []  - Hypo / Hyperglycemic Management (close monitor of Blood Glucose) 0 []  - Ankle / Brachial Index (ABI) - do not check if billed separately 0 X - Vital Signs 1 5 Has the patient been seen at the hospital within the last three years: Yes Total Score: 105 Level Of Care: New/Established - Level 3 Electronic Signature(s) Signed: 04/19/2019 5:39:40 PM By: [name] Entered By: [name] on 04/19/2019 12:00:08 -------------------------------------------------------------------------------- Multi Wound Chart Details Patient Name: Date of Service: [name]. 04/19/2019 10:45 AM Medical Record [id number] Patient Account Number: [id number] Date of Birth/Sex: Treating RN: [birth year] (74 y.o. F) Primary Care Kellin Bartling: [name] Other Clinician: Referring Jacorian Golaszewski: Treating Oswaldo Cueto/Extender:[name],  [name] Weeks in Treatment: 4 Vital Signs Height(in): 67 Pulse(bpm): 75 Weight(lbs): 240 Blood Pressure(mmHg): 134/61 Body Mass Index(BMI): 38 Temperature(F): 97.8 Respiratory 18 Rate(breaths/min): Photos: [1:No Photos] [N/A:N/A] Wound Location: [1:Right Ilium] [N/A:N/A] Wounding Event: [1:Pressure Injury] [N/A:N/A] Primary Etiology: [1:Pressure Ulcer] [N/A:N/A] Comorbid History: [1:Anemia, Chronic Obstructive Pulmonary Disease (COPD), Congestive Heart Failure, Hypertension, Myocardial Infarction, Type II Diabetes, Gout, Osteoarthritis, Paraplegia] [N/A:N/A] Date Acquired: [1:01/23/2019] [N/A:N/A] Weeks of Treatment: [1:4] [N/A:N/A] Wound Status: [1:Open] [N/A:N/A] Measurements L x W x D 4x4.5x4 [N/A:N/A] (cm) Area (cm) : [1:14.137] [N/A:N/A] Volume (cm) : [1:56.549] [N/A:N/A] % Reduction in Area: [1:57.10%] [N/A:N/A] % Reduction in Volume: 49.60% [N/A:N/A] Starting Position 1 12 (o'clock): Ending Position 1 [1:3] (o'clock): Maximum Distance 1 [1:7] (cm): Undermining: [1:Yes] [N/A:N/A] Classification: [1:Category/Stage IV] [N/A:N/A] Exudate Amount: [1:Large] [N/A:N/A] Exudate Type: [1:Serosanguineous] [N/A:N/A] Exudate Color: [1:red, brown] [N/A:N/A] Wound Margin: [1:Well defined, not attached] [N/A:N/A] Granulation Amount: [1:Large (67-100%)] [N/A:N/A] Granulation Quality: [1:Red, Hyper-granulation] [N/A:N/A] Necrotic Amount: [1:Small (1-33%)] [N/A:N/A] Exposed Structures: [1:Fat Layer (Subcutaneous Tissue) Exposed: Yes Bone: Yes Fascia: No Tendon: No Muscle: No Joint: No None] [N/A:N/A N/A] Treatment Notes Electronic Signature(s) Signed: 04/19/2019 5:37:40 PM By: Linton Ham MD Entered By: Linton Ham on 04/19/2019 12:33:03 -------------------------------------------------------------------------------- Multi-Disciplinary Care Plan Details Patient Name: Date of Service: [name]. 04/19/2019 10:45 AM Medical Record [id number] Patient  Account Number: [id number] Date of Birth/Sex: Treating RN: [birth year] (74 y.o. [name] Primary Care Zetta Stoneman: [name] Other Clinician: Referring Maxamus Colao: Treating Uri Turnbough/Extender:[name], [name] Weeks in Treatment: 4 Active Inactive Abuse / Safety / Falls / Self Care Management Nursing Diagnoses: Potential for injury related to transfers Goals: Patient/caregiver will verbalize understanding of skin care regimen Date Initiated: 03/19/2019 Target Resolution Date: 05/17/2019 Goal Status: Active Patient/caregiver will verbalize/demonstrate measures taken to prevent injury and/or falls Date Initiated: 03/19/2019 Target Resolution Date: 05/17/2019 Goal Status: Active  Interventions: Assess Activities of Daily Living upon admission and as needed Assess fall risk on admission and as needed Assess: immobility, friction, shearing, incontinence upon admission and as needed Assess impairment of mobility on admission and as needed per policy Assess personal safety and home safety (as indicated) on admission and as needed Assess self care needs on admission and as needed Notes: Wound/Skin Impairment Nursing Diagnoses: Knowledge deficit related to ulceration/compromised skin integrity Goals: Patient/caregiver will verbalize understanding of skin care regimen Date Initiated: 03/19/2019 Target Resolution Date: 05/17/2019 Goal Status: Active Ulcer/skin breakdown will have a volume reduction of 30% by week 4 Date Initiated: 03/19/2019 Target Resolution Date: 04/26/2019 Goal Status: Active Interventions: Assess patient/caregiver ability to obtain necessary supplies Assess patient/caregiver ability to perform ulcer/skin care regimen upon admission and as needed Assess ulceration(s) every visit Notes: Electronic Signature(s) Signed: 04/19/2019 5:39:40 PM By: [name] Entered By: [name] on 04/19/2019  10:48:39 -------------------------------------------------------------------------------- Pain Assessment Details Patient Name: Date of Service: [name], CRAIGE. 04/19/2019 10:45 AM Medical Record [id number] Patient Account Number: [id number] Date of Birth/Sex: Treating RN: [birth year] (74 y.o. [name] Primary Care Anallely Rosell: [name] Other Clinician: Referring Mykhia Danish: Treating Vicki Chaffin/Extender:[name], [name] Weeks in Treatment: 4 Active Problems Location of Pain Severity and Description of Pain Patient Has Paino No Site Locations Pain Management and Medication Current Pain Management: Electronic Signature(s) Signed: 04/22/2019 6:01:30 PM By: Sandra Coria RN Entered By: [name] on 04/19/2019 11:47:03 -------------------------------------------------------------------------------- Patient/Caregiver Education Details Patient Name: Date of Service: [name] 1/22/2021andnbsp10:45 AM Medical Record Patient Account Number: [id number] Number: Treating RN: [name] Date of Birth/Gender: [birth year] (74 y.o. F) Other Clinician: Primary Care Physician: Lucia Gaskins Referring Physician: Physician/Extender: Jannette Spanner in Treatment: 4 Education Assessment Education Provided To: Patient Education Topics Provided Wound/Skin Impairment: Methods: Explain/Verbal Electronic Signature(s) Signed: 04/19/2019 5:39:40 PM By: [name] Entered By: [name] on 04/19/2019 10:49:21 -------------------------------------------------------------------------------- Wound Assessment Details Patient Name: Date of Service: [name], MELGAR. 04/19/2019 10:45 AM Medical Record [id number] Patient Account Number: [id number] Date of Birth/Sex: Treating RN: [birth year] (74 y.o. F) Primary Care Raissa Dam: [name] Other Clinician: Referring Mariano Doshi: Treating  Olamide Lahaie/Extender:[name], [name] Weeks in Treatment: 4 Wound Status Wound Number: 1 Primary Pressure Ulcer Etiology: Wound Location: Right Ilium Wound Open Wounding Event: Pressure Injury Status: Date Acquired: 01/23/2019 Comorbid Anemia, Chronic Obstructive Pulmonary Weeks Of Treatment: 4 History: Disease (COPD), Congestive Heart Failure, Clustered Wound: No Clustered Wound: No Hypertension, Myocardial Infarction, Type II Diabetes, Gout, Osteoarthritis, Paraplegia Photos Wound Measurements Length: (cm) 4 Width: (cm) 4.5 Depth: (cm) 4 Area: (cm) 14.137 Volume: (cm) 56.549 % Reduction in Area: 57.1% % Reduction in Volume: 49.6% Epithelialization: None Tunneling: No Undermining: Yes Starting Position (o'clock): 12 Ending Position (o'clock): 3 Maximum Distance: (cm) 7 Wound Description Classification: Category/Stage IV Foul Od Wound Margin: Well defined, not attached Slough/ Exudate Amount: Large Exudate Type: Serosanguineous Exudate Color: red, brown Wound Bed Granulation Amount: Large (67-100%) Granulation Quality: Red, Hyper-granulation Fascia E Necrotic Amount: Small (1-33%) Fat Laye Necrotic Quality: Adherent Slough Tendon E Muscle E Joint Ex Bone Exp or After Cleansing: No Fibrino Yes Exposed Structure xposed: No r (Subcutaneous Tissue) Exposed: Yes xposed: No xposed: No posed: No osed: Yes Electronic Signature(s) Signed: 04/23/2019 5:09:30 PM By: Mikeal Hawthorne EMT/HBOT Previous Signature: 04/22/2019 6:01:30 PM Version By: Sandra Coria RN Entered By: Mikeal Hawthorne on 04/23/2019 11:52:20 -------------------------------------------------------------------------------- Vitals Details Patient Name: Date of Service: Sandra Gravel F. 04/19/2019 10:45 AM Medical Record [id number]  Patient Account Number: [id number] Date of Birth/Sex: Treating RN: [birth year] (74 y.o. [name] Primary Care Farid Grigorian: [name] Other  Clinician: Referring Julia Alkhatib: Treating Samyria Rudie/Extender:[name], [name] Weeks in Treatment: 4 Vital Signs Time Taken: 11:46 Temperature (F): 97.8 Height (in): 67 Pulse (bpm): 75 Weight (lbs): 240 Respiratory Rate (breaths/min): 18 Body Mass Index (BMI): 37.6 Blood Pressure (mmHg): 134/61 Reference Range: 80 - 120 mg / dl Electronic Signature(s) Signed: 04/22/2019 6:01:30 PM By: Sandra Coria RN Entered By: [name] on 04/19/2019 11:46:54

## 2019-05-03 ENCOUNTER — Encounter (HOSPITAL_BASED_OUTPATIENT_CLINIC_OR_DEPARTMENT_OTHER): Payer: Medicare Other | Attending: Internal Medicine | Admitting: Internal Medicine

## 2019-05-03 ENCOUNTER — Other Ambulatory Visit: Payer: Self-pay

## 2019-05-03 DIAGNOSIS — G8220 Paraplegia, unspecified: Secondary | ICD-10-CM | POA: Insufficient documentation

## 2019-05-03 DIAGNOSIS — I110 Hypertensive heart disease with heart failure: Secondary | ICD-10-CM | POA: Insufficient documentation

## 2019-05-03 DIAGNOSIS — J449 Chronic obstructive pulmonary disease, unspecified: Secondary | ICD-10-CM | POA: Diagnosis not present

## 2019-05-03 DIAGNOSIS — L89314 Pressure ulcer of right buttock, stage 4: Secondary | ICD-10-CM | POA: Diagnosis not present

## 2019-05-03 DIAGNOSIS — Z6837 Body mass index (BMI) 37.0-37.9, adult: Secondary | ICD-10-CM | POA: Diagnosis not present

## 2019-05-03 DIAGNOSIS — I2510 Atherosclerotic heart disease of native coronary artery without angina pectoris: Secondary | ICD-10-CM | POA: Diagnosis not present

## 2019-05-03 DIAGNOSIS — E669 Obesity, unspecified: Secondary | ICD-10-CM | POA: Diagnosis not present

## 2019-05-03 DIAGNOSIS — F319 Bipolar disorder, unspecified: Secondary | ICD-10-CM | POA: Insufficient documentation

## 2019-05-03 DIAGNOSIS — M109 Gout, unspecified: Secondary | ICD-10-CM | POA: Diagnosis not present

## 2019-05-03 DIAGNOSIS — I509 Heart failure, unspecified: Secondary | ICD-10-CM | POA: Insufficient documentation

## 2019-05-03 DIAGNOSIS — E119 Type 2 diabetes mellitus without complications: Secondary | ICD-10-CM | POA: Insufficient documentation

## 2019-05-03 NOTE — Progress Notes (Addendum)
[name], [name] ([id number]) Visit Report for 05/03/2019 Arrival Information Details Patient Name: Date of Service: [name] 05/03/2019 10:45 AM Medical Record [id number] Patient Account Number: [id number] Date of Birth/Sex: Treating RN: Aug 18, 1945 (74 y.o. [name] Primary Care Emmerich Cryer: Vernell Morgans Other Clinician: Referring Nichols Corter: Treating Ashelyn Mccravy/Extender:Robson, Forestine Chute, Felicita Gage Weeks in Treatment: 6 Visit Information History Since Last Visit All ordered tests and consults were completed: No Patient Arrived: Wheel Chair Added or deleted any medications: No Arrival Time: 11:46 Any new allergies or adverse reactions: No Accompanied By: caregiver Had a fall or experienced change in No activities of daily living that may affect Transfer Assistance: Harrel Lemon Lift risk of falls: Patient Identification Verified: Yes Signs or symptoms of abuse/neglect since last No Secondary Verification Process Completed: Yes visito Patient Requires Transmission-Based No Hospitalized since last visit: No Precautions: Implantable device outside of the clinic excluding No Patient Has Alerts: No cellular tissue based products placed in the center since last visit: Has Dressing in Place as Prescribed: Yes Pain Present Now: No Electronic Signature(s) Signed: 05/03/2019 5:25:54 PM By: Carlene Coria RN Entered By: Carlene Coria on 05/03/2019 11:47:13 -------------------------------------------------------------------------------- Clinic Level of Care Assessment Details Patient Name: Date of Service: [name] 05/03/2019 10:45 AM Medical Record [id number] Patient Account Number: [id number] Date of Birth/Sex: Treating RN: September 30, 1945 (74 y.o. [name] Primary Care Linell Meldrum: Vernell Morgans Other Clinician: Referring Chen Holzman: Treating Zylah Elsbernd/Extender:Robson, Forestine Chute, Felicita Gage Weeks in Treatment: 6 Clinic Level of Care Assessment  Items TOOL 4 Quantity Score X - Use when only an EandM is performed on FOLLOW-UP visit 1 0 ASSESSMENTS - Nursing Assessment / Reassessment X - Reassessment of Co-morbidities (includes updates in patient status) 1 10 X - Reassessment of Adherence to Treatment Plan 1 5 ASSESSMENTS - Wound and Skin Assessment / Reassessment X - Simple Wound Assessment / Reassessment - one wound 1 5 []  - Complex Wound Assessment / Reassessment - multiple wounds 0 []  - Dermatologic / Skin Assessment (not related to wound area) 0 ASSESSMENTS - Focused Assessment []  - Circumferential Edema Measurements - multi extremities 0 X - Nutritional Assessment / Counseling / Intervention 1 10 []  - Lower Extremity Assessment (monofilament, tuning fork, pulses) 0 []  - Peripheral Arterial Disease Assessment (using hand held doppler) 0 ASSESSMENTS - Ostomy and/or Continence Assessment and Care []  - Incontinence Assessment and Management 0 []  - Ostomy Care Assessment and Management (repouching, etc.) 0 PROCESS - Coordination of Care X - Simple Patient / Family Education for ongoing care 1 15 []  - Complex (extensive) Patient / Family Education for ongoing care 0 X - Staff obtains Programmer, systems, Records, Test Results / Process Orders 1 10 X - Staff telephones HHA, Nursing Homes / Clarify orders / etc 1 10 []  - Routine Transfer to another Facility (non-emergent condition) 0 []  - Routine Hospital Admission (non-emergent condition) 0 []  - New Admissions / Biomedical engineer / Ordering NPWT, Apligraf, etc. 0 []  - Emergency Hospital Admission (emergent condition) 0 X - Simple Discharge Coordination 1 10 []  - Complex (extensive) Discharge Coordination 0 PROCESS - Special Needs []  - Pediatric / Minor Patient Management 0 []  - Isolation Patient Management 0 []  - Hearing / Language / Visual special needs 0 []  - Assessment of Community assistance (transportation, D/C planning, etc.) 0 []  - Additional assistance / Altered mentation  0 []  - Support Surface(s) Assessment (bed, cushion, seat, etc.) 0 INTERVENTIONS - Wound Cleansing / Measurement X - Simple Wound Cleansing - one wound 1 5 []  -  Complex Wound Cleansing - multiple wounds 0 X - Wound Imaging (photographs - any number of wounds) 1 5 []  - Wound Tracing (instead of photographs) 0 X - Simple Wound Measurement - one wound 1 5 []  - Complex Wound Measurement - multiple wounds 0 INTERVENTIONS - Wound Dressings X - Small Wound Dressing one or multiple wounds 1 10 []  - Medium Wound Dressing one or multiple wounds 0 []  - Large Wound Dressing one or multiple wounds 0 X - Application of Medications - topical 1 5 []  - Application of Medications - injection 0 INTERVENTIONS - Miscellaneous []  - External ear exam 0 []  - Specimen Collection (cultures, biopsies, blood, body fluids, etc.) 0 []  - Specimen(s) / Culture(s) sent or taken to Lab for analysis 0 X - Patient Transfer (multiple staff / Civil Service fast streamer / Similar devices) 1 10 []  - Simple Staple / Suture removal (25 or less) 0 []  - Complex Staple / Suture removal (26 or more) 0 []  - Hypo / Hyperglycemic Management (close monitor of Blood Glucose) 0 []  - Ankle / Brachial Index (ABI) - do not check if billed separately 0 X - Vital Signs 1 5 Has the patient been seen at the hospital within the last three years: Yes Total Score: 120 Level Of Care: New/Established - Level 4 Electronic Signature(s) Signed: 05/03/2019 5:49:59 PM By: Kela Millin Entered By: Kela Millin on 05/03/2019 12:06:19 -------------------------------------------------------------------------------- Encounter Discharge Information Details Patient Name: Date of Service: [name], [name] 05/03/2019 10:45 AM Medical Record [id number] Patient Account Number: [id number] Date of Birth/Sex: Treating RN: December 10, 1945 (74 y.o. [name] Primary Care Joaquin Knebel: Vernell Morgans Other Clinician: Referring Fantasia Jinkins: Treating  Tal Neer/Extender:Robson, Forestine Chute, Tresea Mall in Treatment: 6 Encounter Discharge Information Items Discharge Condition: Stable Ambulatory Status: Wheelchair Discharge Destination: Skilled Nursing Facility Telephoned: No Orders Sent: Yes Transportation: Other Accompanied By: caregiver Schedule Follow-up Appointment: Yes Clinical Summary of Care: Electronic Signature(s) Signed: 05/03/2019 5:54:34 PM By: Deon Pilling Entered By: Deon Pilling on 05/03/2019 12:35:10 -------------------------------------------------------------------------------- Multi Wound Chart Details Patient Name: Date of Service: Babilonia, Sandra F. 05/03/2019 10:45 AM Medical Record [id number] Patient Account Number: [id number] Date of Birth/Sex: Treating RN: 1946-01-13 (74 y.o. F) Primary Care Leianne Callins: Vernell Morgans Other Clinician: Referring Jeanna Giuffre: Treating Mathew Storck/Extender:Robson, Forestine Chute, FERNANDO Weeks in Treatment: 6 Vital Signs Height(in): 67 Pulse(bpm): 1 Weight(lbs): 240 Blood Pressure(mmHg): 143/72 Body Mass Index(BMI): 38 Temperature(F): 98.4 Respiratory 18 Rate(breaths/min): Photos: [N/A:N/A] Wound Location: [1:Right Ischial Tuberosity] [N/A:N/A N/A] Wounding Event: [1:Pressure Injury] [N/A:N/A N/A] Primary Etiology: [1:Pressure Ulcer] [N/A:N/A N/A] Comorbid History: [1:Anemia, Chronic Obstructive Pulmonary Disease (COPD), Congestive Heart Failure, Hypertension, Myocardial Infarction, Type II Diabetes, Gout, Osteoarthritis, Paraplegia] [N/A:N/A N/A] Date Acquired: [1:01/23/2019] [N/A:N/A N/A] Weeks of Treatment: [1:6] [N/A:N/A N/A] Wound Status: [1:Open] [N/A:N/A N/A] Measurements L x W x D 4x4x3.5 [N/A:N/A N/A] (cm) Area (cm) : [1:12.566] [N/A:N/A N/A] Volume (cm) : [1:43.982] [N/A:N/A N/A] % Reduction in Area: [1:61.90%] [N/A:N/A N/A] % Reduction in Volume: 60.80% [N/A:N/A N/A] Starting Position 1 12 (o'clock): Ending Position 1  [1:3] (o'clock): Maximum Distance 1 [1:6.2] (cm): Undermining: [1:Yes] [N/A:N/A N/A] Classification: [1:Category/Stage IV] [N/A:N/A N/A] Exudate Amount: [1:Large] [N/A:N/A N/A] Exudate Type: [1:Serosanguineous] [N/A:N/A N/A] Exudate Color: [1:red, brown] [N/A:N/A N/A] Wound Margin: [1:Well defined, not attached] [N/A:N/A N/A] Granulation Amount: [1:Large (67-100%)] [N/A:N/A N/A] Granulation Quality: [1:Red, Hyper-granulation] [N/A:N/A N/A] Necrotic Amount: [1:Small (1-33%)] [N/A:N/A N/A] Exposed Structures: [1:Fat Layer (Subcutaneous Tissue) Exposed: Yes Bone: Yes Fascia: No Tendon: No Muscle: No Joint: No None] [N/A:N/A N/A N/A N/A] Treatment Notes Wound #  1 (Right Ilium) 1. Cleanse With Wound Cleanser 2. Periwound Care Skin Prep 3. Primary Dressing Applied Other primary dressing (specifiy in notes) 4. Secondary Dressing Foam Border Dressing 5. Secured With Self Adhesive Bandage Notes wet to dry in clinic. Electronic Signature(s) Signed: 05/07/2019 5:30:42 PM By: Levan Hurst RN, BSN Previous Signature: 05/03/2019 5:45:44 PM Version By: Linton Ham MD Entered By: Levan Hurst on 05/07/2019 11:34:37 -------------------------------------------------------------------------------- Multi-Disciplinary Care Plan Details Patient Name: Date of Service: NECHUMA, FAUBION 05/03/2019 10:45 AM Medical Record [id number] Patient Account Number: [id number] Date of Birth/Sex: Treating RN: May 29, 1945 (74 y.o. [name] Primary Care Lavonia Eager: Vernell Morgans Other Clinician: Referring Md Smola: Treating Naithan Delage/Extender:Robson, Forestine Chute, Felicita Gage Weeks in Treatment: 6 Active Inactive Abuse / Safety / Falls / Self Care Management Nursing Diagnoses: Potential for injury related to transfers Goals: Patient/caregiver will verbalize understanding of skin care regimen Date Initiated: 03/19/2019 Target Resolution Date: 05/17/2019 Goal Status:  Active Patient/caregiver will verbalize/demonstrate measures taken to prevent injury and/or falls Date Initiated: 03/19/2019 Target Resolution Date: 05/17/2019 Goal Status: Active Interventions: Assess Activities of Daily Living upon admission and as needed Assess fall risk on admission and as needed Assess: immobility, friction, shearing, incontinence upon admission and as needed Assess impairment of mobility on admission and as needed per policy Assess personal safety and home safety (as indicated) on admission and as needed Assess self care needs on admission and as needed Notes: Wound/Skin Impairment Nursing Diagnoses: Knowledge deficit related to ulceration/compromised skin integrity Goals: Patient/caregiver will verbalize understanding of skin care regimen Date Initiated: 03/19/2019 Target Resolution Date: 05/17/2019 Goal Status: Active Ulcer/skin breakdown will have a volume reduction of 30% by week 4 Date Initiated: 03/19/2019 Target Resolution Date: 04/26/2019 Goal Status: Active Interventions: Assess patient/caregiver ability to obtain necessary supplies Assess patient/caregiver ability to perform ulcer/skin care regimen upon admission and as needed Assess ulceration(s) every visit Notes: Electronic Signature(s) Signed: 05/03/2019 5:49:59 PM By: Kela Millin Entered By: Kela Millin on 05/03/2019 11:12:44 -------------------------------------------------------------------------------- Pain Assessment Details Patient Name: Date of Service: DOUNIA, NOTCH. 05/03/2019 10:45 AM Medical Record [id number] Patient Account Number: [id number] Date of Birth/Sex: Treating RN: January 02, 1946 (74 y.o. [name] Primary Care Greidys Deland: Vernell Morgans Other Clinician: Referring Wylee Ogden: Treating Kenn Rekowski/Extender:Robson, Forestine Chute, Felicita Gage Weeks in Treatment: 6 Active Problems Location of Pain Severity and Description of Pain Patient Has Paino No Site  Locations Pain Management and Medication Current Pain Management: Electronic Signature(s) Signed: 05/03/2019 5:25:54 PM By: Carlene Coria RN Entered By: Carlene Coria on 05/03/2019 11:47:42 -------------------------------------------------------------------------------- Patient/Caregiver Education Details Patient Name: Date of Service: TOMISHA, KLEIN 2/5/2021andnbsp10:45 AM Medical Record [id number] Patient Account Number: [id number] Date of Birth/Gender: Dec 01, 1945 (73 y.o. F) Treating RN: Kela Millin Primary Care Physician: Vernell Morgans Other Clinician: Referring Physician: Treating Physician/Extender:Robson, Forestine Chute, Tresea Mall in Treatment: 6 Education Assessment Education Provided To: Patient Education Topics Provided Wound/Skin Impairment: Methods: Explain/Verbal Responses: State content correctly Electronic Signature(s) Signed: 05/03/2019 5:49:59 PM By: Kela Millin Entered By: Kela Millin on 05/03/2019 11:13:06 -------------------------------------------------------------------------------- Wound Assessment Details Patient Name: Date of Service: NDEA, [name]. 05/03/2019 10:45 AM Medical Record [id number] Patient Account Number: [id number] Date of Birth/Sex: Treating RN: [birth year] (74 y.o. Nancy Fetter Primary Care Suzette Flagler: Vernell Morgans Other Clinician: Referring Seleste Tallman: Treating Delaynee Alred/Extender:Robson, Forestine Chute, FERNANDO Weeks in Treatment: 6 Wound Status Wound Number: 1 Primary Pressure Ulcer Etiology: Wound Location: Right Ischial Tuberosity Wound Open Wounding Event: Pressure Injury Status: Date Acquired: 01/23/2019 Comorbid Anemia, Chronic Obstructive Pulmonary Weeks Of Treatment: 6 History: Disease (COPD), Congestive  Heart Failure, Clustered Wound: No Hypertension, Myocardial Infarction, Type II Diabetes, Gout, Osteoarthritis, Paraplegia Photos Wound Measurements Length: (cm) 4 Width: (cm)  4 Depth: (cm) 3.5 Area: (cm) 12.566 Volume: (cm) 43.982 % Reduction in Area: 61.9% % Reduction in Volume: 60.8% Epithelialization: None Tunneling: No Undermining: Yes Starting Position (o'clock): 12 Ending Position (o'clock): 3 Maximum Distance: (cm) 6.2 Wound Description Classification: Category/Stage IV Wound Margin: Well defined, not attached Exudate Amount: Large Exudate Type: Serosanguineous Exudate Color: red, brown Wound Bed Granulation Amount: Large (67-100%) Granulation Quality: Red, Hyper-granulation Necrotic Amount: Small (1-33%) Necrotic Quality: Adherent Slough Foul Odor After Cleansing: No Slough/Fibrino Yes Exposed Structure Fascia Exposed: No Fat Layer (Subcutaneous Tissue) Exposed: Yes Tendon Exposed: No Muscle Exposed: No Joint Exposed: No Bone Exposed: Yes Treatment Notes Wound #1 (Right Ilium) 1. Cleanse With Wound Cleanser 2. Periwound Care Skin Prep 3. Primary Dressing Applied Other primary dressing (specifiy in notes) 4. Secondary Dressing Foam Border Dressing 5. Secured With Self Adhesive Bandage Notes wet to dry in clinic. Electronic Signature(s) Signed: 05/07/2019 5:30:42 PM By: Levan Hurst RN, BSN Previous Signature: 05/03/2019 4:37:46 PM Version By: Mikeal Hawthorne EMT/HBOT Entered By: Levan Hurst on 05/07/2019 11:34:17 -------------------------------------------------------------------------------- [place] Details Patient Name: Date of Service: [name], [name]. 05/03/2019 10:45 AM Medical Record [id number] Patient Account Number: [id number] Date of Birth/Sex: Treating RN: 1946-01-08 (74 y.o. [name] Primary Care Aayansh Codispoti: Vernell Morgans Other Clinician: Referring Juliocesar Blasius: Treating Kallie Depolo/Extender:Robson, Forestine Chute, Felicita Gage Weeks in Treatment: 6 Vital Signs Time Taken: 11:47 Temperature (F): 98.4 Height (in): 67 Pulse (bpm): 82 Weight (lbs): 240 Respiratory Rate (breaths/min): 18 Body Mass Index  (BMI): 37.6 Blood Pressure (mmHg): 143/72 Reference Range: 80 - 120 mg / dl Electronic Signature(s) Signed: 05/03/2019 5:25:54 PM By: Carlene Coria RN Entered By: Carlene Coria on 05/03/2019 11:47:35

## 2019-05-03 NOTE — Progress Notes (Addendum)
BASSY, EAKINS ([id number]) Visit Report for 05/03/2019 HPI Details Patient Name: Date of Service: [name], [name] 05/03/2019 10:45 AM Medical Record [id number] Patient Account Number: [id number] Date of Birth/Sex: Treating RN: 08/20/1945 (74 y.o. 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 This was apparently a vascular issue. 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 Apparently she developed a pressure ulcer in the right buttock sometime in late October. At 1 point this had a really necrotic surface although the facility is already managed to get this to clean up with Santyl. The patient spends most of her day in bed but she is sitting up at a right angle. She rarely gets in a wheelchair. She has a Foley catheter. She eats well. Past medical history; coronary artery disease, migraine headaches, congestive heart failure, gout, hypertension, type 2 diabetes which is well controlled with a recent hemoglobin A1c of 6.5, COPD, bipolar disorder, obesity, as noted longstanding lower thoracic paralysis 04/19/2019; 1 month follow-up. I am not sure why this was so delayed. The patient had an x-ray done at the facility that was negative for osteomyelitis bone that I cultured last time showed MRSA but negative for osteomyelitis on pathology Lab work that was done at the facility showed a white count of 10.2 with a normal differential. Comprehensive metabolic panel was normal except for an albumin of 3. Sedimentation rate was 40 C-reactive protein however was very high at 106. I am  uncertain whether there could be something else contributing to the elevated inflammatory markers. We gave her 2 weeks of Zyvox is my understanding at 600 twice daily. They have been using wet-to-dry dressings 2/5; pressure ulcer right ischial tuberosity. She has completed the 2 weeks of Zyvox. At this point I am not planning on pushing any additional antibiotics. She is on protein supplements. We switch to one quarter strength Dakin's wet-to-dry last week to see if we could get debris off the surface of the wound Electronic Signature(s) Signed: 05/03/2019 5:45:44 PM By: Linton Ham MD Entered By: Linton Ham on 05/03/2019 12:25:36 -------------------------------------------------------------------------------- Physical Exam Details Patient Name: Date of Service: [name], [name]. 05/03/2019 10:45 AM Medical Record [id number] Patient Account Number: [id number] Date of Birth/Sex: Treating RN: 06-Feb-1946 (74 y.o. F) Primary Care Provider: Vernell Morgans Other Clinician: Referring Provider: Treating Provider/Extender:Woodson Macha, Forestine Chute, FERNANDO Weeks in Treatment: 6 Constitutional Sitting or standing Blood Pressure is within target range for patient.. Pulse regular and within target range for patient.Marland Kitchen Respirations regular, non-labored and within target range.. Temperature is normal and within the target range for the patient.Marland Kitchen Appears in no distress. Notes Wound exam; over right buttock. This probes over the right ischial tuberosity with extensive undermining superiorly. There is still no palpable bone rolled cobblestoned granulation over the wound. There is no surrounding erythema no purulence. No mechanical debridement today. Electronic Signature(s) Signed: 05/03/2019 5:45:44 PM By: Linton Ham MD Entered By: Linton Ham on 05/03/2019 12:26:36 -------------------------------------------------------------------------------- Physician Orders Details Patient  Name: Date of Service: [name], [name] 05/03/2019 10:45 AM Medical Record [id number] Patient Account Number: [id number] Date of Birth/Sex: Treating RN: 19-Oct-1945 (74 y.o. F) Kela Millin Primary Care Provider: Vernell Morgans Other  Clinician: Referring Provider: Treating Provider/Extender:Brentin Shin, Forestine Chute, FERNANDO Weeks in Treatment: 6 Verbal / Phone Orders: No Diagnosis Coding ICD-10 Coding Code Description L89.314 Pressure ulcer of right buttock, stage 4 G82.21 Paraplegia, complete Follow-up Appointments Return Appointment in 2 weeks. Dressing Change Frequency Change dressing every day. Wound Cleansing Wound #1 Right Ischial Tuberosity Clean wound with Wound Cleanser Primary Wound Dressing Other: - Dakin's 1/4 solution, moisten kerlix and lightly pack. wet to dry in clinic Secondary Dressing ABD pad - or foam border Electronic Signature(s) Signed: 05/07/2019 5:08:08 PM By: Linton Ham MD Signed: 05/07/2019 5:30:42 PM By: Levan Hurst RN, BSN Previous Signature: 05/03/2019 5:45:44 PM Version By: Linton Ham MD Previous Signature: 05/03/2019 5:49:59 PM Version By: Kela Millin Entered By: Levan Hurst on 05/07/2019 11:34:28 -------------------------------------------------------------------------------- Problem List Details Patient Name: Date of Service: [name], [name]. 05/03/2019 10:45 AM Medical Record [id number] Patient Account Number: [id number] Date of Birth/Sex: Treating RN: [birth year] (74 y.o. 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 05/03/2019 10:45 AM Medical Record [id number] Patient Account Number: [id number] Date of Birth/Sex: Treating RN: 1945/11/21 (74 y.o. 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 This was apparently a vascular issue. 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 Apparently she developed a pressure ulcer in the right buttock sometime in late October. At 1 point this had a really necrotic surface although the facility is already managed to get this to clean up with Santyl. The patient spends most of her day in bed but she is sitting up at a right angle. She rarely gets in a wheelchair. She has a Foley catheter. She eats well. Past medical history; coronary artery disease, migraine headaches, congestive heart failure, gout, hypertension, type 2 diabetes which is well controlled with a recent hemoglobin A1c of 6.5, COPD, bipolar disorder, obesity, as noted longstanding lower thoracic paralysis 04/19/2019; 1 month follow-up. I am not sure why this was so delayed. The patient had an x-ray done at the facility that was negative for osteomyelitis bone that I cultured last time showed MRSA but negative for osteomyelitis on pathology Lab work that was done at the facility showed a white count of 10.2 with a normal differential.  Comprehensive metabolic panel was normal except for an albumin of 3. Sedimentation rate was 40 C-reactive protein however was very high at 106. I am uncertain whether there could be something else contributing to the elevated inflammatory markers. We gave her 2 weeks of Zyvox is my understanding at 600 twice daily. They have been using wet-to-dry dressings 2/5; pressure ulcer right ischial tuberosity. She has completed the 2 weeks of Zyvox. At this point I am not planning on pushing any additional antibiotics. She is on protein supplements. We switch  to one quarter strength Dakin's wet-to-dry last week to see if we could get debris off the surface of the wound Objective Constitutional Sitting or standing Blood Pressure is within target range for patient.. Pulse regular and within target range for patient.Marland Kitchen Respirations regular, non-labored and within target range.. Temperature is normal and within the target range for the patient.Marland Kitchen Appears in no distress. Vitals Time Taken: 11:47 AM, Height: 67 in, Weight: 240 lbs, BMI: 37.6, Temperature: 98.4 F, Pulse: 82 bpm, Respiratory Rate: 18 breaths/min, Blood Pressure: 143/72 mmHg. General Notes: Wound exam; over right buttock. This probes over the right ischial tuberosity with extensive undermining superiorly. There is still no palpable bone rolled cobblestoned granulation over the wound. There is no surrounding erythema no purulence. No mechanical debridement today. Integumentary (Hair, Skin) Wound #1 status is Open. Original cause of wound was Pressure Injury. The wound is located on the Right Ischial Tuberosity. The wound measures 4cm length x 4cm width x 3.5cm depth; 12.566cm^2 area and 43.982cm^3 volume. There is bone and Fat Layer (Subcutaneous Tissue) Exposed exposed. There is no tunneling noted, however, there is undermining starting at 12:00 and ending at 3:00 with a maximum distance of 6.2cm. There is a large amount of serosanguineous  drainage noted. The wound margin is well defined and not attached to the wound base. There is large (67-100%) red, hyper - granulation within the wound bed. There is a small (1-33%) amount of necrotic tissue within the wound bed including Adherent Slough. Assessment Active Problems ICD-10 Pressure ulcer of right buttock, stage 4 Paraplegia, complete Plan Follow-up Appointments: Return Appointment in 2 weeks. Dressing Change Frequency: Change dressing every day. Wound Cleansing: Wound #1 Right Ischial Tuberosity: Clean wound with Wound Cleanser Primary Wound Dressing: Other: - Dakin's 1/4 solution, moisten kerlix and lightly pack. wet to dry in clinic Secondary Dressing: ABD pad - or foam border 1. I am continue with the Dakin's for another 2 weeks. 2. The cobblestone base of this wound may need a reasonably extensive debridement. This would be difficult. I do not think this would support healing even with a wound VAC 3 there is not exposed bone which is an improvemnt. Electronic Signature(s) Signed: 05/07/2019 5:08:08 PM By: Linton Ham MD Signed: 05/07/2019 5:30:42 PM By: Levan Hurst RN, BSN Previous Signature: 05/03/2019 5:45:44 PM Version By: Linton Ham MD Entered By: Levan Hurst on 05/07/2019 11:35:33 -------------------------------------------------------------------------------- SuperBill Details Patient Name: Date of Service: [name], [name]. 05/03/2019 Medical Record [id number] Patient Account Number: [id number] Date of Birth/Sex: Treating RN: 1945-09-05 (74 y.o. [name] Primary Care Provider: Vernell Morgans Other Clinician: Referring Provider: Treating Provider/Extender:Everley Evora, Forestine Chute, FERNANDO Weeks in Treatment: 6 Diagnosis Coding ICD-10 Codes Code Description L89.314 Pressure ulcer of right buttock, stage 4 G82.21 Paraplegia, complete Facility Procedures CPT4 Code: [id number] Description: [id number] - WOUND CARE VISIT-LEV 4 EST  PT Modifier: Quantity: 1 Physician Procedures CPT4 Code: [id number] Description: [id number] - WC PHYS LEVEL 3 - EST PT ICD-10 Diagnosis Description L89.314 Pressure ulcer of right buttock, stage 4 G82.21 Paraplegia, complete Modifier: Quantity: 1 Electronic Signature(s) Signed: 05/03/2019 5:45:44 PM By: Linton Ham MD Entered By: Linton Ham on 05/03/2019 12:29:54

## 2019-05-17 ENCOUNTER — Other Ambulatory Visit: Payer: Self-pay

## 2019-05-17 ENCOUNTER — Encounter (HOSPITAL_BASED_OUTPATIENT_CLINIC_OR_DEPARTMENT_OTHER): Payer: Medicare Other | Admitting: Internal Medicine

## 2019-05-17 DIAGNOSIS — L89314 Pressure ulcer of right buttock, stage 4: Secondary | ICD-10-CM | POA: Diagnosis not present

## 2019-05-23 NOTE — Progress Notes (Signed)
EVAMAE, PLUCHINO ([id number]) Visit Report for 05/17/2019 Debridement Details Patient Name: Date of Service: [name], [name] 05/17/2019 10:45 AM Medical Record [id number] Patient Account Number: [id number] Date of Birth/Sex: 1945-04-29 (74 y.o. F) Treating RN: Kela Millin Primary Care Provider: Vernell Morgans Other Clinician: Referring Provider: Treating Provider/Extender:Lyndzie Zentz, Forestine Chute, FERNANDO Weeks in Treatment: 8 Debridement Performed for Wound #1 Right Ischial Tuberosity Assessment: Performed By: Physician Ricard Dillon., MD Debridement Type: Debridement Level of Consciousness (Pre- Awake and Alert procedure): Pre-procedure Yes - 11:45 Verification/Time Out Taken: Start Time: 11:45 Pain Control: Other : Benzocaine 20% Total Area Debrided (L x W): 7 (cm) x 3.5 (cm) = 24.5 (cm) Tissue and other material Viable, Non-Viable, Subcutaneous debrided: Level: Skin/Subcutaneous Tissue Debridement Description: Excisional Instrument: Curette Bleeding: Minimum Hemostasis Achieved: Pressure End Time: 11:46 Procedural Pain: 0 Post Procedural Pain: 0 Response to Treatment: Procedure was tolerated well Level of Consciousness Awake and Alert (Post-procedure): Post Debridement Measurements of Total Wound Length: (cm) 7 Stage: Category/Stage IV Width: (cm) 3.5 Depth: (cm) 4 Volume: (cm) 76.969 Character of Wound/Ulcer Post Improved Debridement: Post Procedure Diagnosis Same as Pre-procedure Electronic Signature(s) Signed: 05/17/2019 6:29:59 PM By: Linton Ham MD Signed: 05/20/2019 1:40:10 PM By: Kela Millin Entered By: Linton Ham on 05/17/2019 13:25:35 -------------------------------------------------------------------------------- HPI Details Patient Name: Date of Service: [name] 05/17/2019 10:45 AM Medical Record [id number] Patient Account Number: [id number] Date of Birth/Sex: Treating RN: 21-Mar-1946 (74 y.o. 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 This was apparently a vascular issue. 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 Apparently she developed a pressure ulcer in the right buttock sometime in late October. At 1 point this had a really necrotic surface although the facility is already managed to get this to clean up with Santyl. The patient spends most of her day in bed but she is sitting up at a right angle. She rarely gets in a wheelchair. She has a Foley catheter. She eats well. Past medical history; coronary artery disease, migraine headaches, congestive heart failure, gout, hypertension, type 2 diabetes which is well controlled with a recent hemoglobin A1c of 6.5, COPD, bipolar disorder, obesity, as noted longstanding lower thoracic paralysis 04/19/2019; 1 month follow-up. I am not sure why this was so delayed. The patient had an x-ray done at the facility that was negative for osteomyelitis bone that I cultured last time showed MRSA but negative for osteomyelitis on pathology Lab work that was done at the facility showed a white count of 10.2 with a normal differential. Comprehensive metabolic panel was normal except for an albumin of 3. Sedimentation rate was 40 C-reactive protein however was very high at 106. I am uncertain whether there could be something else contributing to the elevated inflammatory markers. We gave her 2 weeks of Zyvox is my understanding at 600 twice daily. They have been using wet-to-dry dressings 2/5; pressure ulcer right ischial  tuberosity. She has completed the 2 weeks of Zyvox. At this point I am not planning on pushing any additional antibiotics. She is on protein supplements. We switch to one quarter strength Dakin's wet-to-dry last week to see if we could get  debris off the surface of the wound 2/19; pressure ulcer right ischial tuberosity. This is larger and deeper. We were told historically that she has not been letting some of the dressing changes happen at the nursing home. I am not exactly sure how many of these. 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 We have been using Dakin's wet to dry Electronic Signature(s) Signed: 05/20/2019 5:55:43 PM By: Linton Ham MD Signed: 05/23/2019 8:56:01 AM By: Levan Hurst RN, BSN Previous Signature: 05/17/2019 6:29:59 PM Version By: Linton Ham MD Entered By: Levan Hurst on 05/20/2019 13:12:19 -------------------------------------------------------------------------------- Physical Exam Details Patient Name: Date of Service: CHRYSANTHEMUM, LECKIE. 05/17/2019 10:45 AM Medical Record [id number] Patient Account Number: [id number] Date of Birth/Sex: Treating RN: 06/16/1945 (74 y.o. [name] Primary Care Provider: Vernell Morgans Other Clinician: Referring Provider: Treating Provider/Extender:Dwana Garin, Forestine Chute, FERNANDO Weeks in Treatment: 8 Constitutional Sitting or standing Blood Pressure is within target range for patient.. Pulse regular and within target range for patient.Marland Kitchen Respirations regular, non-labored and within target range.. Temperature is normal and within the target range for the patient.Marland Kitchen Appears in no distress. Notes Wound exam over the right buttock. This probes over the ischial tuberosity itself. There is no exposed bone she still has the same polypoid cobblestoned tissue over the wound bed. I attempted debridement of this with a large open curette however I was not able to make  much progress. I am really uncertain whether this represents a viable wound surface or not which was the reason for the debridement in the first place Electronic Signature(s) Signed: 05/17/2019 6:29:59 PM By: Linton Ham MD Entered By: Linton Ham on 05/17/2019 13:27:27 -------------------------------------------------------------------------------- Physician Orders Details Patient Name: Date of Service: SUELLA, WILMORE. 05/17/2019 10:45 AM Medical Record [id number] Patient Account Number: [id number] Date of Birth/Sex: Treating RN: [birth year] (74 y.o. [name] Primary Care Provider: Vernell Morgans Other Clinician: Referring Provider: Treating Provider/Extender:Nezzie Manera, Forestine Chute, FERNANDO Weeks in Treatment: 8 Verbal / Phone Orders: No Diagnosis Coding ICD-10 Coding Code Description L89.314 Pressure ulcer of right buttock, stage 4 G82.21 Paraplegia, complete Follow-up Appointments Return Appointment in 2 weeks. Dressing Change Frequency Change dressing every day. Wound Cleansing Wound #1 Right Ischial Tuberosity Clean wound with Wound Cleanser Primary Wound Dressing Silver Collagen - to base of wound bed and wet to dry dressing over Secondary Dressing ABD pad - or foam border Off-Loading Turn and reposition every 2 hours Electronic Signature(s) Signed: 05/17/2019 6:29:59 PM By: Linton Ham MD Signed: 05/20/2019 1:40:10 PM By: Kela Millin Entered By: Kela Millin on 05/17/2019 11:48:04 -------------------------------------------------------------------------------- Problem List Details Patient Name: Date of Service: [name] 05/17/2019 10:45 AM Medical Record [id number] Patient Account Number: [id number] Date of Birth/Sex: Treating RN: [birth year] (74 y.o. 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 05/17/2019 10:45 AM Medical Record [id number] Patient Account Number: [id number] Date of Birth/Sex: Treating RN: September 19, 1945 (74 y.o. 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 This was apparently a vascular issue. 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 Apparently she developed a pressure ulcer in the right buttock sometime in late October. At 1 point this had a really necrotic surface although the facility is already managed to get this to clean up with Santyl. The patient spends most of her day in bed but she is sitting up at a right angle. She rarely gets in a wheelchair. She has a Foley catheter. She eats well. Past medical history; coronary artery disease, migraine headaches, congestive heart failure, gout, hypertension, type 2 diabetes which is well controlled with a recent hemoglobin A1c of 6.5, COPD, bipolar disorder, obesity, as noted longstanding lower thoracic paralysis 04/19/2019; 1 month  follow-up. I am not sure why this was so delayed. The patient had an x-ray done at the facility that was negative for osteomyelitis bone that I cultured last time showed MRSA but negative for osteomyelitis on pathology Lab work that was done at the facility showed a white count of 10.2 with a normal differential. Comprehensive metabolic panel was normal except for an albumin of 3. Sedimentation rate was 40 C-reactive protein however was very high at 106. I am uncertain whether there could be something else contributing to the elevated inflammatory markers. We gave her 2 weeks of Zyvox is my understanding at 600 twice daily. They have been using wet-to-dry dressings 2/5; pressure ulcer right ischial tuberosity. She has completed the 2 weeks of Zyvox. At this point I am not planning on pushing any additional antibiotics. She is on protein supplements. We switch to one quarter strength Dakin's wet-to-dry last week to see if we could get debris off the surface of the wound 2/19; pressure ulcer right ischial tuberosity. This is larger and deeper. We were told historically that she has not been letting some of the dressing changes happen at the nursing home. I am not exactly sure how many of these. 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 We have been using Dakin's wet to dry Objective Constitutional Sitting or standing Blood Pressure is within target range for patient.. Pulse regular and within target range for patient.Marland Kitchen Respirations regular, non-labored and within target range.. Temperature is normal and within the target range for the patient.Marland Kitchen Appears in no distress. Vitals Time Taken: 11:22 AM, Height: 67 in, Weight: 240 lbs, BMI: 37.6, Temperature: 98.3 F, Pulse: 75 bpm, Respiratory Rate: 18 breaths/min, Blood Pressure: 135/55 mmHg. General Notes: Wound exam over the right buttock. This probes over the ischial tuberosity itself. There is  no exposed bone she still has the same polypoid cobblestoned tissue over the wound bed. I attempted debridement of this with a large open curette however I was not able to make much progress. I am really uncertain whether this represents a viable wound surface or not which was the reason for the debridement in the first place Integumentary (Hair, Skin) Wound #1 status is Open. Original cause of wound was Pressure Injury. The wound is located on the Right Ischial Tuberosity. The wound measures 7cm length x 3.5cm width x 4cm depth; 19.242cm^2 area and 76.969cm^3 volume. There is bone and Fat Layer (Subcutaneous Tissue) Exposed exposed. There  is a large amount of serosanguineous drainage noted. The wound margin is well defined and not attached to the wound base. There is large (67-100%) red, hyper - granulation within the wound bed. There is a small (1-33%) amount of necrotic tissue within the wound bed including Adherent Slough. Assessment Active Problems ICD-10 Pressure ulcer of right buttock, stage 4 Paraplegia, complete Procedures Wound #1 Pre-procedure diagnosis of Wound #1 is a Pressure Ulcer located on the Right Ischial Tuberosity . There was a Excisional Skin/Subcutaneous Tissue Debridement with a total area of 24.5 sq cm performed by Ricard Dillon., MD. With the following instrument(s): Curette to remove Viable and Non-Viable tissue/material. Material removed includes Subcutaneous Tissue after achieving pain control using Other (Benzocaine 20%). No specimens were taken. A time out was conducted at 11:45, prior to the start of the procedure. A Minimum amount of bleeding was controlled with Pressure. The procedure was tolerated well with a pain level of 0 throughout and a pain level of 0 following the procedure. Post Debridement Measurements: 7cm length x 3.5cm width x 4cm depth; 76.969cm^3 volume. Post debridement Stage noted as Category/Stage IV. Character of Wound/Ulcer Post  Debridement is improved. Post procedure Diagnosis Wound #1: Same as Pre-Procedure Plan Follow-up Appointments: Return Appointment in 2 weeks. Dressing Change Frequency: Change dressing every day. Wound Cleansing: Wound #1 Right Ischial Tuberosity: Clean wound with Wound Cleanser Primary Wound Dressing: Silver Collagen - to base of wound bed and wet to dry dressing over Secondary Dressing: ABD pad - or foam border Off-Loading: Turn and reposition every 2 hours 1. I change the primary dressing to moistened silver collagen with the backing wet-to-dry. I think Dakin's has cleaned off the surface of the wound is much as possible 2. I am not certain that the base of this wound represents a viable surface. 3. She did not have osteomyelitis per my biopsy 4. I am concerned about the issue about not allowing dressing changes as and I am not exactly sure how frequently this is happening. 5. Not an easy area for a wound VAC especially for an experienced staff Electronic Signature(s) Signed: 05/20/2019 5:55:43 PM By: Linton Ham MD Signed: 05/23/2019 8:56:01 AM By: Levan Hurst RN, BSN Previous Signature: 05/17/2019 6:29:59 PM Version By: Linton Ham MD Entered By: Levan Hurst on 05/20/2019 13:12:33 -------------------------------------------------------------------------------- SuperBill Details Patient Name: Date of Service: DESHARA, CRAIGIE. 05/17/2019 Medical Record [id number] Patient Account Number: [id number] Date of Birth/Sex: Treating RN: 1945/07/01 (74 y.o. 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, stage 4 G82.21 Paraplegia, complete Facility Procedures CPT4 Code: [id number] Description: [id number] - DEB SUBQ TISSUE 20 SQ CM/< ICD-10 Diagnosis  Description L89.314 Pressure ulcer of right buttock, stage 4 Modifier: Quantity: 1 CPT4 Code: [id number] Description: [id number] - DEB SUBQ TISS EA ADDL 20CM ICD-10 Diagnosis Description L89.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

## 2019-05-31 ENCOUNTER — Encounter (HOSPITAL_BASED_OUTPATIENT_CLINIC_OR_DEPARTMENT_OTHER): Payer: Medicare Other | Admitting: Internal Medicine

## 2019-06-07 ENCOUNTER — Encounter (HOSPITAL_BASED_OUTPATIENT_CLINIC_OR_DEPARTMENT_OTHER): Payer: Medicare Other | Attending: Internal Medicine | Admitting: Internal Medicine

## 2019-06-07 ENCOUNTER — Other Ambulatory Visit: Payer: Self-pay

## 2019-06-07 DIAGNOSIS — I509 Heart failure, unspecified: Secondary | ICD-10-CM | POA: Diagnosis not present

## 2019-06-07 DIAGNOSIS — E669 Obesity, unspecified: Secondary | ICD-10-CM | POA: Insufficient documentation

## 2019-06-07 DIAGNOSIS — I110 Hypertensive heart disease with heart failure: Secondary | ICD-10-CM | POA: Diagnosis not present

## 2019-06-07 DIAGNOSIS — J449 Chronic obstructive pulmonary disease, unspecified: Secondary | ICD-10-CM | POA: Insufficient documentation

## 2019-06-07 DIAGNOSIS — I2510 Atherosclerotic heart disease of native coronary artery without angina pectoris: Secondary | ICD-10-CM | POA: Insufficient documentation

## 2019-06-07 DIAGNOSIS — G8221 Paraplegia, complete: Secondary | ICD-10-CM | POA: Diagnosis not present

## 2019-06-07 DIAGNOSIS — E119 Type 2 diabetes mellitus without complications: Secondary | ICD-10-CM | POA: Insufficient documentation

## 2019-06-07 DIAGNOSIS — F319 Bipolar disorder, unspecified: Secondary | ICD-10-CM | POA: Diagnosis not present

## 2019-06-07 DIAGNOSIS — Z6837 Body mass index (BMI) 37.0-37.9, adult: Secondary | ICD-10-CM | POA: Diagnosis not present

## 2019-06-07 DIAGNOSIS — L89314 Pressure ulcer of right buttock, stage 4: Secondary | ICD-10-CM | POA: Insufficient documentation

## 2019-06-07 DIAGNOSIS — M109 Gout, unspecified: Secondary | ICD-10-CM | POA: Diagnosis not present

## 2019-06-10 NOTE — Progress Notes (Addendum)
[name] ([id number]) Visit Report for 06/07/2019 Arrival Information Details Patient Name: Date of Service: [name], [name] 06/07/2019 2:15 PM Medical Record [id number] Patient Account Number: [id number] Date of Birth/Sex: Treating RN: Apr 25, 1945 (74 y.o. [name] Primary Care Venetia Prewitt: Vernell Morgans Other Clinician: Referring Aujanae Mccullum: Treating Kinslee Dalpe/Extender:Robson, Forestine Chute, [name] in Treatment: 11 Visit Information History Since Last Visit Added or deleted any medications: No Patient Arrived: Wheel Chair Any new allergies or adverse reactions: No Arrival Time: 14:24 Had a fall or experienced change in No activities of daily living that may affect Accompanied By: facility risk of falls: staff Signs or symptoms of abuse/neglect since last No Transfer Assistance: Royal Patient Identification Verified: Yes Hospitalized since last visit: No Secondary Verification Process Completed: Yes Implantable device outside of the clinic excluding No Patient Requires Transmission-Based No cellular tissue based products placed in the center Precautions: since last visit: Patient Has Alerts: No Has Dressing in Place as Prescribed: Yes Pain Present Now: Yes Electronic Signature(s) Signed: 06/07/2019 5:31:10 PM By: Baruch Gouty RN, BSN Entered By: Baruch Gouty on 06/07/2019 14:25:07 -------------------------------------------------------------------------------- Clinic Level of Care Assessment Details Patient Name: Date of Service: [name], MAMON. 06/07/2019 2:15 PM Medical Record [id number] Patient Account Number: [id number] Date of Birth/Sex: Treating RN: 1945-08-24 (74 y.o. [name] Primary Care Phinneas Shakoor: Vernell Morgans Other Clinician: Referring Zoha Spranger: Treating Gypsy Kellogg/Extender:Robson, Forestine Chute, [name] Weeks in Treatment: 11 Clinic Level of Care Assessment Items TOOL 4 Quantity Score X -  Use when only an EandM is performed on FOLLOW-UP visit 1 0 ASSESSMENTS - Nursing Assessment / Reassessment X - Reassessment of Co-morbidities (includes updates in patient status) 1 10 X - Reassessment of Adherence to Treatment Plan 1 5 ASSESSMENTS - Wound and Skin Assessment / Reassessment X - Simple Wound Assessment / Reassessment - one wound 1 5 []  - Complex Wound Assessment / Reassessment - multiple wounds 0 []  - Dermatologic / Skin Assessment (not related to wound area) 0 ASSESSMENTS - Focused Assessment []  - Circumferential Edema Measurements - multi extremities 0 []  - Nutritional Assessment / Counseling / Intervention 0 []  - Lower Extremity Assessment (monofilament, tuning fork, pulses) 0 []  - Peripheral Arterial Disease Assessment (using hand held doppler) 0 ASSESSMENTS - Ostomy and/or Continence Assessment and Care []  - Incontinence Assessment and Management 0 []  - Ostomy Care Assessment and Management (repouching, etc.) 0 PROCESS - Coordination of Care X - Simple Patient / Family Education for ongoing care 1 15 []  - Complex (extensive) Patient / Family Education for ongoing care 0 X - Staff obtains Programmer, systems, Records, Test Results / Process Orders 1 10 X - Staff telephones HHA, Nursing Homes / Clarify orders / etc 1 10 []  - Routine Transfer to another Facility (non-emergent condition) 0 []  - Routine Hospital Admission (non-emergent condition) 0 []  - New Admissions / Biomedical engineer / Ordering NPWT, Apligraf, etc. 0 []  - Emergency Hospital Admission (emergent condition) 0 X - Simple Discharge Coordination 1 10 []  - Complex (extensive) Discharge Coordination 0 PROCESS - Special Needs []  - Pediatric / Minor Patient Management 0 []  - Isolation Patient Management 0 []  - Hearing / Language / Visual special needs 0 []  - Assessment of Community assistance (transportation, D/C planning, etc.) 0 []  - Additional assistance / Altered mentation 0 []  - Support Surface(s)  Assessment (bed, cushion, seat, etc.) 0 INTERVENTIONS - Wound Cleansing / Measurement X - Simple Wound Cleansing - one wound 1 5 []  - Complex Wound Cleansing - multiple wounds 0  X - Wound Imaging (photographs - any number of wounds) 1 5 []  - Wound Tracing (instead of photographs) 0 X - Simple Wound Measurement - one wound 1 5 []  - Complex Wound Measurement - multiple wounds 0 INTERVENTIONS - Wound Dressings X - Small Wound Dressing one or multiple wounds 1 10 []  - Medium Wound Dressing one or multiple wounds 0 []  - Large Wound Dressing one or multiple wounds 0 X - Application of Medications - topical 1 5 []  - Application of Medications - injection 0 INTERVENTIONS - Miscellaneous []  - External ear exam 0 []  - Specimen Collection (cultures, biopsies, blood, body fluids, etc.) 0 []  - Specimen(s) / Culture(s) sent or taken to Lab for analysis 0 X - Patient Transfer (multiple staff / Civil Service fast streamer / Similar devices) 1 10 []  - Simple Staple / Suture removal (25 or less) 0 []  - Complex Staple / Suture removal (26 or more) 0 []  - Hypo / Hyperglycemic Management (close monitor of Blood Glucose) 0 []  - Ankle / Brachial Index (ABI) - do not check if billed separately 0 X - Vital Signs 1 5 Has the patient been seen at the hospital within the last three years: Yes Total Score: 110 Level Of Care: New/Established - Level 3 Electronic Signature(s) Signed: 06/07/2019 5:31:36 PM By: Kela Millin Entered By: Kela Millin on 06/07/2019 15:19:48 -------------------------------------------------------------------------------- Encounter Discharge Information Details Patient Name: Date of Service: [name]. 06/07/2019 2:15 PM Medical Record [id number] Patient Account Number: [id number] Date of Birth/Sex: Treating RN: 01/28/1946 (74 y.o. [name] Primary Care Sudeep Scheibel: Vernell Morgans Other Clinician: Referring Decklyn Hornik: Treating Aylissa Heinemann/Extender:Robson, Forestine Chute,  [name] in Treatment: 11 Encounter Discharge Information Items Discharge Condition: Stable Ambulatory Status: Wheelchair Discharge Destination: Home Transportation: Private Auto Accompanied By: caregiver Schedule Follow-up Appointment: Yes Clinical Summary of Care: Electronic Signature(s) Signed: 06/07/2019 5:45:00 PM By: Deon Pilling Entered By: Deon Pilling on 06/07/2019 15:56:45 -------------------------------------------------------------------------------- Multi Wound Chart Details Patient Name: Date of Service: [name]. 06/07/2019 2:15 PM Medical Record [id number] Patient Account Number: [id number] Date of Birth/Sex: Treating RN: 1946-01-11 (74 y.o. [name] Primary Care Remi Rester: Vernell Morgans Other Clinician: Referring Kamilo Och: Treating Makaylen Thieme/Extender:Robson, Forestine Chute, [name] Weeks in Treatment: 11 Vital Signs Height(in): 67 Pulse(bpm): 75 Weight(lbs): 240 Blood Pressure(mmHg): 136/65 Body Mass Index(BMI): 38 Temperature(F): 98.8 Respiratory 20 Rate(breaths/min): Photos: [1:No Photos] [N/A:N/A] Wound Location: [1:Right Ischial Tuberosity] [N/A:N/A] Wounding Event: [1:Pressure Injury] [N/A:N/A] Primary Etiology: [1:Pressure Ulcer] [N/A:N/A] Comorbid History: [1:Anemia, Chronic Obstructive Pulmonary Disease (COPD), Congestive Heart Failure, Hypertension, Myocardial Infarction, Type II Diabetes, Gout, Osteoarthritis, Paraplegia] [N/A:N/A] Date Acquired: [1:01/23/2019] [N/A:N/A N/A] Weeks of Treatment: [1:11] [N/A:N/A N/A] Wound Status: [1:Open] [N/A:N/A N/A] Measurements L x W x D 4.7x3x4.5 [N/A:N/A N/A] (cm) Area (cm) : [1:11.074] [N/A:N/A N/A] Volume (cm) : [1:49.834] [N/A:N/A N/A] % Reduction in Area: [1:66.40%] [N/A:N/A N/A] % Reduction in Volume: 55.60% [N/A:N/A N/A] Starting Position 1 12 (o'clock): Ending Position 1 [1:4] (o'clock): Maximum Distance 1 [1:6] (cm): Undermining: [1:Yes] [N/A:N/A  N/A] Classification: [1:Category/Stage IV] [N/A:N/A N/A] Exudate Amount: [1:Medium] [N/A:N/A N/A] Exudate Type: [1:Serosanguineous] [N/A:N/A N/A] Exudate Color: [1:red, brown] [N/A:N/A N/A] Wound Margin: [1:Distinct, outline attached] [N/A:N/A N/A] Granulation Amount: [1:Large (67-100%)] [N/A:N/A N/A] Granulation Quality: [1:Red, Hyper-granulation] [N/A:N/A N/A] Necrotic Amount: [1:Small (1-33%)] [N/A:N/A N/A] Exposed Structures: [1:Fat Layer (Subcutaneous Tissue) Exposed: Yes Fascia: No Tendon: No Muscle: No Joint: No Bone: No None] [N/A:N/A N/A N/A N/A] Treatment Notes Wound #1 (Right Ischial Tuberosity) 1. Cleanse With Wound Cleanser 2. Periwound Care Skin Prep  3. Primary Dressing Applied Collegen AG Hydrogel or K-Y Jelly Other primary dressing (specifiy in notes) 4. Secondary Dressing Foam Border Dressing 5. Secured With Self Adhesive Bandage Notes wet to dry backing the prisma. Electronic Signature(s) Signed: 06/07/2019 5:31:36 PM By: Kela Millin Signed: 06/10/2019 8:43:01 AM By: Linton Ham MD Entered By: Linton Ham on 06/07/2019 16:22:15 -------------------------------------------------------------------------------- Multi-Disciplinary Care Plan Details Patient Name: Date of Service: [name]. 06/07/2019 2:15 PM Medical Record [id number] Patient Account Number: [id number] Date of Birth/Sex: Treating RN: 1945-07-15 (74 y.o. [name] Primary Care Mateja Dier: Vernell Morgans Other Clinician: Referring Ashland Osmer: Treating Sotero Brinkmeyer/Extender:Robson, Forestine Chute, [name] Weeks in Treatment: 11 Active Inactive Abuse / Safety / Falls / Self Care Management Nursing Diagnoses: Potential for injury related to transfers Goals: Patient/caregiver will verbalize understanding of skin care regimen Date Initiated: 03/19/2019 Target Resolution Date: 07/12/2019 Goal Status: Active Patient/caregiver will verbalize/demonstrate measures taken  to prevent injury and/or falls Date Initiated: 03/19/2019 Target Resolution Date: 07/12/2019 Goal Status: Active Interventions: Assess Activities of Daily Living upon admission and as needed Assess fall risk on admission and as needed Assess: immobility, friction, shearing, incontinence upon admission and as needed Assess impairment of mobility on admission and as needed per policy Assess personal safety and home safety (as indicated) on admission and as needed Assess self care needs on admission and as needed Notes: Wound/Skin Impairment Nursing Diagnoses: Knowledge deficit related to ulceration/compromised skin integrity Goals: Patient/caregiver will verbalize understanding of skin care regimen Date Initiated: 03/19/2019 Target Resolution Date: 07/12/2019 Goal Status: Active Ulcer/skin breakdown will have a volume reduction of 30% by week 4 Date Initiated: 03/19/2019 Date Inactivated: 05/17/2019 Target Resolution Date: 04/26/2019 Unmet Reason: wound has Goal Status: Unmet become larger Interventions: Assess patient/caregiver ability to obtain necessary supplies Assess patient/caregiver ability to perform ulcer/skin care regimen upon admission and as needed Assess ulceration(s) every visit Notes: Electronic Signature(s) Signed: 06/07/2019 5:31:36 PM By: Kela Millin Entered By: Kela Millin on 06/07/2019 14:16:22 -------------------------------------------------------------------------------- Pain Assessment Details Patient Name: Date of Service: [name], SIPLE. 06/07/2019 2:15 PM Medical Record [id number] Patient Account Number: [id number] Date of Birth/Sex: Treating RN: January 08, 1946 (74 y.o. [name] Primary Care Diann Bangerter: Vernell Morgans Other Clinician: Referring Lonney Revak: Treating Mariangel Ringley/Extender:Robson, Forestine Chute, [name] Weeks in Treatment: 11 Active Problems Location of Pain Severity and Description of Pain Patient Has Paino Yes Site  Locations Pain Location: Generalized Pain With Dressing Change: No Rate the pain. Current Pain Level: 8 Pain Management and Medication Current Pain Management: Notes reports headache Electronic Signature(s) Signed: 06/07/2019 5:31:10 PM By: Baruch Gouty RN, BSN Entered By: Baruch Gouty on 06/07/2019 14:26:09 -------------------------------------------------------------------------------- Patient/Caregiver Education Details Patient Name: Date of Service: [name], [name] 3/12/2021andnbsp2:15 PM Medical Record [id number] Patient Account Number: [id number] Date of Birth/Gender: 12-14-1945 (73 y.o. F) Treating RN: Kela Millin Primary Care Physician: Vernell Morgans Other Clinician: Referring Physician: Treating Physician/Extender:Robson, Forestine Chute, [name] in Treatment: 11 Education Assessment Education Provided To: Patient Education Topics Provided Wound/Skin Impairment: Methods: Explain/Verbal Responses: State content correctly Electronic Signature(s) Signed: 06/07/2019 5:31:36 PM By: Kela Millin Entered By: Kela Millin on 06/07/2019 14:16:35 -------------------------------------------------------------------------------- Wound Assessment Details Patient Name: Date of Service: [name], YIU. 06/07/2019 2:15 PM Medical Record [id number] Patient Account Number: [id number] Date of Birth/Sex: Treating RN: March 23, 1946 (74 y.o. [name] Primary Care Nicloe Frontera: Vernell Morgans Other Clinician: Referring Tedra Coppernoll: Treating Trase Bunda/Extender:Robson, Forestine Chute, [name] Weeks in Treatment: 11 Wound Status Wound Number: 1 Primary Pressure Ulcer Etiology: Wound Location: Right Ischial Tuberosity Wound Open Wounding Event: Pressure  Injury Status: Date Acquired: 01/23/2019 Comorbid Anemia, Chronic Obstructive Pulmonary Weeks Of Treatment: 11 History: Disease (COPD), Congestive Heart Failure, Clustered Wound:  No Hypertension, Myocardial Infarction, Type II Diabetes, Gout, Osteoarthritis, Paraplegia Photos Wound Measurements Length: (cm) 4.7 Width: (cm) 3 Depth: (cm) 4.5 Area: (cm) 11.074 Volume: (cm) 49.834 % Reduction in Area: 66.4% % Reduction in Volume: 55.6% Epithelialization: None Tunneling: No Undermining: Yes Starting Position (o'clock): 12 Ending Position (o'clock): 4 Maximum Distance: (cm) 6 Wound Description Classification: Category/Stage IV Wound Margin: Distinct, outline attached Exudate Amount: Medium Exudate Type: Serosanguineous Exudate Color: red, brown Wound Bed Granulation Amount: Large (67-100%) Granulation Quality: Red, Hyper-granulation Necrotic Amount: Small (1-33%) Necrotic Quality: Adherent Slough Foul Odor After Cleansing: No Slough/Fibrino Yes Exposed Structure Fascia Exposed: No Fat Layer (Subcutaneous Tissue) Exposed: Yes Tendon Exposed: No Muscle Exposed: No Joint Exposed: No Bone Exposed: No Treatment Notes Wound #1 (Right Ischial Tuberosity) 1. Cleanse With Wound Cleanser 2. Periwound Care Skin Prep 3. Primary Dressing Applied Collegen AG Hydrogel or K-Y Jelly Other primary dressing (specifiy in notes) 4. Secondary Dressing Foam Border Dressing 5. Secured With Self Adhesive Bandage Notes wet to dry backing the prisma. Electronic Signature(s) Signed: 06/10/2019 5:11:29 PM By: Mikeal Hawthorne EMT/HBOT Signed: 06/10/2019 5:25:45 PM By: Kela Millin Previous Signature: 06/07/2019 5:31:10 PM Version By: Baruch Gouty RN, BSN Entered By: Mikeal Hawthorne on 06/10/2019 14:50:34 -------------------------------------------------------------------------------- Vitals Details Patient Name: Date of Service: Sandra Gravel F. 06/07/2019 2:15 PM Medical Record [id number] Patient Account Number: [id number] Date of Birth/Sex: Treating RN: Oct 29, 1945 (74 y.o. [name] Primary Care Michiel Sivley: Vernell Morgans Other  Clinician: Referring Djuan Talton: Treating Nishtha Raider/Extender:Robson, Forestine Chute, [name] Weeks in Treatment: 11 Vital Signs Time Taken: 14:25 Temperature (F): 98.8 Height (in): 67 Pulse (bpm): 75 Source: Stated Respiratory Rate (breaths/min): 20 Weight (lbs): 240 Blood Pressure (mmHg): 136/65 Source: Stated Reference Range: 80 - 120 mg / dl Body Mass Index (BMI): 37.6 Electronic Signature(s) Signed: 06/07/2019 5:31:10 PM By: Baruch Gouty RN, BSN Entered By: Baruch Gouty on 06/07/2019 14:26:33

## 2019-06-10 NOTE — Progress Notes (Signed)
CAPRI, HUPPERT ([id number]) Visit Report for 06/07/2019 HPI Details Patient Name: Date of Service: [name], [name] 06/07/2019 2:15 PM Medical Record [id number] Patient Account Number: [id number] Date of Birth/Sex: Treating RN: 02-15-1946 (74 y.o. 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 This was apparently a vascular issue. 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 Apparently she developed a pressure ulcer in the right buttock sometime in late October. At 1 point this had a really necrotic surface although the facility is already managed to get this to clean up with Santyl. The patient spends most of her day in bed but she is sitting up at a right angle. She rarely gets in a wheelchair. She has a Foley catheter. She eats well. Past medical history; coronary artery disease, migraine headaches, congestive heart failure, gout, hypertension, type 2 diabetes which is well controlled with a recent hemoglobin A1c of 6.5, COPD, bipolar disorder, obesity, as noted longstanding lower thoracic paralysis 04/19/2019; 1 month follow-up. I am not sure why this was so delayed. The patient had an x-ray done at the facility that was negative for osteomyelitis bone that I cultured last time showed MRSA but negative for osteomyelitis on pathology Lab work that was done at the facility showed a white count of 10.2 with a normal differential. Comprehensive metabolic panel was normal except for an albumin of 3. Sedimentation rate was 40 C-reactive protein however was very  high at 106. I am uncertain whether there could be something else contributing to the elevated inflammatory markers. We gave her 2 weeks of Zyvox is my understanding at 600 twice daily. They have been using wet-to-dry dressings 2/5; pressure ulcer right ischial tuberosity. She has completed the 2 weeks of Zyvox. At this point I am not planning on pushing any additional antibiotics. She is on protein supplements. We switch to one quarter strength Dakin's wet-to-dry last week to see if we could get debris off the surface of the wound 2/19; pressure ulcer right ischial tuberosity. This is larger and deeper. We were told historically that she has not been letting some of the dressing changes happen at the nursing home. I am not exactly sure how many of these. 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 We have been using Dakin's wet to dry 3/12; pressure ulcer right ischial tuberosity. This abuts right over the ischial tuberosity itself. There is palpable bone here although there is some nonviable tissue over the top none of this really requires debridement. We have some indirect information from the facility that the patient is constantly refusing dressing changes including at times where she needs to be changed for incontinence which contaminates. If this is true there is no hope of doing anything with this wound. I have asked for documentation of this. I certainly would not consider a wound VAC in this situation. Electronic Signature(s) Signed: 06/10/2019 8:43:01 AM By: Linton Ham MD Entered By: Linton Ham on 06/07/2019 16:24:42 -------------------------------------------------------------------------------- Physical Exam Details Patient Name: Date of Service: HAYVN, [name]. 06/07/2019 2:15 PM Medical Record [id number] Patient Account Number: [id number] Date of Birth/Sex: Treating RN:  Nov 03, 1945 (74 y.o. [name] Primary Care Provider: Vernell Morgans Other Clinician: Referring Provider: Treating Provider/Extender:Garnet Chatmon, Forestine Chute, FERNANDO Weeks in Treatment: 11 Constitutional Sitting or standing Blood Pressure is within target range for patient.. Pulse regular and within target range for patient.Marland Kitchen Respirations regular, non-labored and within target range.. Temperature is normal and within the target range for the patient.Marland Kitchen Appears in no distress. Respiratory work of breathing is normal. Cardiovascular No erythema around the wound. Psychiatric appears at normal baseline. Notes Wound exam; over the right buttock. This probes over the ischial tuberosity itself there is no absolutely exposed bone visually however there is some cobblestone polypoid tissue over this. It would be possible to get a bone biopsy if imaging suggested osteomyelitis. There is no soft tissue infection. Electronic Signature(s) Signed: 06/10/2019 8:43:01 AM By: Linton Ham MD Entered By: Linton Ham on 06/07/2019 16:26:14 -------------------------------------------------------------------------------- Physician Orders Details Patient Name: Date of Service: KARELLY, GARRATT. 06/07/2019 2:15 PM Medical Record [id number] Patient Account Number: [id number] Date of Birth/Sex: Treating RN: 08/03/1945 (74 y.o. [name] Primary Care Provider: Vernell Morgans Other Clinician: Referring Provider: Treating Provider/Extender:Ishaq Maffei, Forestine Chute, FERNANDO Weeks in Treatment: 7 Verbal / Phone Orders: No Diagnosis Coding ICD-10 Coding Code Description L89.314 Pressure ulcer of right buttock, stage 4 G82.21 Paraplegia, complete Follow-up Appointments Return Appointment in 2 weeks. Dressing Change Frequency Change dressing every day. Wound Cleansing Wound #1 Right Ischial Tuberosity Clean wound with Wound Cleanser Primary Wound Dressing Silver Collagen - to base of wound bed and wet  to dry dressing over Secondary Dressing ABD pad - or foam border Off-Loading Turn and reposition every 2 hours Electronic Signature(s) Signed: 06/07/2019 5:31:36 PM By: Kela Millin Signed: 06/10/2019 8:43:01 AM By: Linton Ham MD Entered By: Kela Millin on 06/07/2019 14:16:05 -------------------------------------------------------------------------------- Problem List Details Patient Name: Date of Service: [name]. 06/07/2019 2:15 PM Medical Record [id number] Patient Account Number: [id number] Date of Birth/Sex: Treating RN: 1945/12/21 (74 y.o. [name] Primary Care Provider: Vernell Morgans Other Clinician: Referring Provider: Treating Provider/Extender:Delyle Weider, Forestine Chute, FERNANDO Weeks in Treatment: 11 Active Problems ICD-10 Evaluated Encounter Code Description Active Date Today Diagnosis L89.314 Pressure ulcer of right buttock, stage 4 03/19/2019 No Yes G82.21 Paraplegia, complete 03/19/2019 No Yes Inactive Problems Resolved Problems Electronic Signature(s) Signed: 06/10/2019 8:43:01 AM By: Linton Ham MD Entered By: Linton Ham on 06/07/2019 16:22:09 -------------------------------------------------------------------------------- Progress Note Details Patient Name: Date of Service: [name], Sandra F. 06/07/2019 2:15 PM Medical Record [id number] Patient Account Number: [id number] Date of Birth/Sex: Treating RN: 1945/08/08 (74 y.o. 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 This was apparently a vascular issue. 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 Apparently she developed a pressure ulcer in the right buttock sometime in late October. At 1 point this had a really necrotic surface although the facility is already managed to get this to clean up with Santyl. The patient spends most of her day in bed but she is sitting up at a right angle. She rarely gets in a wheelchair. She has a Foley catheter. She eats well. Past medical history; coronary artery disease, migraine headaches, congestive heart failure,  gout, hypertension, type 2 diabetes which is well controlled with a recent hemoglobin A1c of 6.5, COPD, bipolar disorder, obesity, as noted longstanding lower thoracic paralysis 04/19/2019; 1 month follow-up. I am not sure why this was so delayed. The patient had an x-ray done at the facility that was negative for osteomyelitis bone that I cultured last time showed MRSA but negative for osteomyelitis on pathology Lab work that was done at the facility showed a white count of 10.2 with a normal differential. Comprehensive metabolic panel was normal except for an albumin of 3. Sedimentation rate was 40 C-reactive protein however was very high at 106. I am uncertain whether there could be something else contributing to the elevated inflammatory markers. We gave her 2 weeks of Zyvox is my understanding at 600 twice daily. They have been using wet-to-dry dressings 2/5; pressure ulcer right ischial tuberosity. She has completed the 2 weeks of Zyvox. At this point I am not planning on pushing any additional antibiotics. She is on protein supplements. We switch to one quarter strength Dakin's wet-to-dry last week to see if we could get debris off the surface of the wound 2/19; pressure ulcer right ischial tuberosity. This is larger and deeper. We were told historically that she has not been letting some of the dressing changes happen at the nursing home. I am not exactly sure  how many of these. 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 We have been using Dakin's wet to dry 3/12; pressure ulcer right ischial tuberosity. This abuts right over the ischial tuberosity itself. There is palpable bone here although there is some nonviable tissue over the top none of this really requires debridement. We have some indirect information from the facility that the patient is constantly refusing dressing changes including at times where she needs to be changed for incontinence which contaminates. If this is true there is no hope of doing anything with this wound. I have asked for documentation of this. I certainly would not consider a wound VAC in this situation. Objective Constitutional Sitting or standing Blood Pressure is within target range for patient.. Pulse regular and within target range for patient.Marland Kitchen Respirations regular, non-labored and within target range.. Temperature is normal and within the target range for the patient.Marland Kitchen Appears in no distress. Vitals Time Taken: 2:25 PM, Height: 67 in, Source: Stated, Weight: 240 lbs, Source: Stated, BMI: 37.6, Temperature: 98.8 F, Pulse: 75 bpm, Respiratory Rate: 20 breaths/min, Blood Pressure: 136/65 mmHg. Respiratory work of breathing is normal. Cardiovascular No erythema around the wound. Psychiatric appears at normal baseline. General Notes: Wound exam; over the right buttock. This probes over the ischial tuberosity itself there is no absolutely exposed bone visually however there is some cobblestone polypoid tissue over this. It would be possible to get a bone biopsy if imaging suggested osteomyelitis. There is no soft tissue infection. Integumentary (Hair, Skin) Wound #1 status is Open. Original cause of wound was Pressure Injury. The wound is located on the Right Ischial Tuberosity. The wound measures 4.7cm length x 3cm width x 4.5cm depth; 11.074cm^2 area and  49.834cm^3 volume. There is Fat Layer (Subcutaneous Tissue) Exposed exposed. There is no tunneling noted, however, there is undermining starting at 12:00 and ending at 4:00 with a maximum distance of 6cm. There is a medium amount of serosanguineous drainage noted. The wound margin is distinct with the outline attached to the wound base. There is large (67-100%) red, hyper - granulation within  the wound bed. There is a small (1-33%) amount of necrotic tissue within the wound bed including Adherent Slough. Assessment Active Problems ICD-10 Pressure ulcer of right buttock, stage 4 Paraplegia, complete Plan Follow-up Appointments: Return Appointment in 2 weeks. Dressing Change Frequency: Change dressing every day. Wound Cleansing: Wound #1 Right Ischial Tuberosity: Clean wound with Wound Cleanser Primary Wound Dressing: Silver Collagen - to base of wound bed and wet to dry dressing over Secondary Dressing: ABD pad - or foam border Off-Loading: Turn and reposition every 2 hours 1. Silver collagen with backing wet-to-dry dressing 2. I would like documentation from the facility of the frequency of dressing change refusals which indirectly I have heard is been several days without changing in spite of incontinence. If this is true I think we just go to palliative dressings on this area. The patient completely denies any of this and says the dressings are being changed every day 3. Given #2 I am certainly not going to look at a wound VAC currently. Electronic Signature(s) Signed: 06/10/2019 8:43:01 AM By: Linton Ham MD Entered By: Linton Ham on 06/07/2019 16:27:17 -------------------------------------------------------------------------------- SuperBill Details Patient Name: Date of Service: [name]. 06/07/2019 Medical Record [id number] Patient Account Number: [id number] Date of Birth/Sex: Treating RN: Jan 13, 1946 (74 y.o. [name] Primary Care  Provider: Vernell Morgans Other Clinician: Referring Provider: Treating Provider/Extender:Arkel Cartwright, Forestine Chute, FERNANDO Weeks in Treatment: 11 Diagnosis Coding ICD-10 Codes Code Description L89.314 Pressure ulcer of right buttock, stage 4 G82.21 Paraplegia, complete Facility Procedures CPT4 Code: [id number] Description: [id number] - WOUND CARE VISIT-LEV 3 EST PT Modifier: Quantity: 1 Physician Procedures CPT4 Code: [id number] Description: [id number] - WC PHYS LEVEL 3 - EST PT ICD-10 Diagnosis Description L89.314 Pressure ulcer of right buttock, stage 4 G82.21 Paraplegia, complete Modifier: Quantity: 1 Electronic Signature(s) Signed: 06/10/2019 8:43:01 AM By: Linton Ham MD Entered By: Linton Ham on 06/07/2019 16:27:33

## 2019-06-21 ENCOUNTER — Encounter (HOSPITAL_BASED_OUTPATIENT_CLINIC_OR_DEPARTMENT_OTHER): Payer: Medicare Other | Admitting: Internal Medicine

## 2019-07-10 NOTE — Progress Notes (Signed)
[name] ([id number]) Visit Report for 05/17/2019 Arrival Information Details Patient Name: Date of Service: [name], [name] 05/17/2019 10:45 AM Medical Record [id number] Patient Account Number: [id number] Date of Birth/Sex: 1945/07/29 (74 y.o. Female) Treating RN: Kela Millin Primary Care Jahniah Pallas: Vernell Morgans Other Clinician: Referring Jennett Tarbell: Treating Andrej Spagnoli/Extender:Robson, Forestine Chute, FERNANDO Weeks in Treatment: 8 Visit Information History Since Last Visit Added or deleted any medications: No Patient Arrived: Wheel Chair Any new allergies or adverse reactions: No Arrival Time: 11:01 Had a fall or experienced change in No activities of daily living that may affect Accompanied By: caregiver risk of falls: Transfer Assistance: [employer] fast streamer Signs or symptoms of abuse/neglect since last No Patient Identification Verified: Yes visito Secondary Verification Process Completed: Yes Hospitalized since last visit: No Patient Requires Transmission-Based No Implantable device outside of the clinic excluding No Precautions: cellular tissue based products placed in the center Patient Has Alerts: No since last visit: Has Dressing in Place as Prescribed: Yes Pain Present Now: No Electronic Signature(s) Signed: 07/10/2019 9:23:26 AM By: Sandre Kitty Entered By: Sandre Kitty on 05/17/2019 11:22:23 -------------------------------------------------------------------------------- Encounter Discharge Information Details Patient Name: Date of Service: Sandra Gravel F. 05/17/2019 10:45 AM Medical Record [id number] Patient Account Number: [id number] Date of Birth/Sex: 1945/09/24 (74 y.o. Female) Treating RN: Deon Pilling Primary Care Bowen Kia: Vernell Morgans Other Clinician: Referring Mikiah Demond: Treating Kinslie Hove/Extender:Robson, Forestine Chute, FERNANDO Weeks in Treatment: 8 Encounter Discharge Information Items Post Procedure Vitals Discharge  Condition: Stable Temperature (F): 98.3 Ambulatory Status: Wheelchair Pulse (bpm): 75 Discharge Destination: Home Respiratory Rate (breaths/min): 18 Transportation: Private Auto Blood Pressure (mmHg): 135/55 Accompanied By: caregiver Schedule Follow-up Appointment: Yes Clinical Summary of Care: Electronic Signature(s) Signed: 05/17/2019 6:28:18 PM By: Deon Pilling Entered By: Deon Pilling on 05/17/2019 14:31:31 -------------------------------------------------------------------------------- Multi Wound Chart Details Patient Name: Date of Service: Sandra Romney. 05/17/2019 10:45 AM Medical Record [id number] Patient Account Number: [id number] Date of Birth/Sex: 1946/02/15 (74 y.o. Female) Treating RN: Kela Millin Primary Care Kahmya Pinkham: Vernell Morgans Other Clinician: Referring Laqueshia Cihlar: Treating Tegh Franek/Extender:Robson, Forestine Chute, FERNANDO Weeks in Treatment: 8 Vital Signs Height(in): 67 Pulse(bpm): 75 Weight(lbs): 240 Blood Pressure(mmHg): 135/55 Body Mass Index(BMI): 38 Temperature(F): 98.3 Respiratory 18 Rate(breaths/min): Photos: [1:No Photos] [N/A:N/A] Wound Location: [1:Right Ischial Tuberosity] [N/A:N/A] Wounding Event: [1:Pressure Injury] [N/A:N/A] Primary Etiology: [1:Pressure Ulcer] [N/A:N/A] Date Acquired: [1:01/23/2019] [N/A:N/A] Weeks of Treatment: [1:8] [N/A:N/A] Wound Status: [1:Open] [N/A:N/A] Measurements L x W x D 7x3.5x4 [N/A:N/A] (cm) Area (cm) : [1:19.242] [N/A:N/A] Volume (cm) : [1:76.969] [N/A:N/A] % Reduction in Area: [1:41.70%] [N/A:N/A] % Reduction in Volume: 31.40% [N/A:N/A] Classification: [1:Category/Stage IV] [N/A:N/A] Debridement: [1:Debridement - Excisional] [N/A:N/A] Pre-procedure [1:11:45] [N/A:N/A] Verification/Time Out Taken: Pain Control: [1:Other] [N/A:N/A] Tissue Debrided: [1:Subcutaneous] [N/A:N/A] Level: [1:Skin/Subcutaneous Tissue] [N/A:N/A] Debridement Area (sq cm):24.5 [N/A:N/A] Instrument:  [1:Curette] [N/A:N/A] Bleeding: [1:Minimum] [N/A:N/A] Hemostasis Achieved: [1:Pressure] [N/A:N/A] Procedural Pain: [1:0] [N/A:N/A] Post Procedural Pain: [1:0] [N/A:N/A] Debridement Treatment [1:Procedure was tolerated] [N/A:N/A] Response: [1:well] Post Debridement [1:7x3.5x4] [N/A:N/A] Measurements L x W x D (cm) Post Debridement [1:76.969] [N/A:N/A] Volume: (cm) Post Debridement Stage: [1:Category/Stage IV Debridement] [N/A:N/A N/A] Treatment Notes Electronic Signature(s) Signed: 05/17/2019 6:29:59 PM By: Linton Ham MD Signed: 05/20/2019 1:40:10 PM By: Kela Millin Entered By: Linton Ham on 05/17/2019 13:25:25 -------------------------------------------------------------------------------- Multi-Disciplinary Care Plan Details Patient Name: Date of Service: Sandra Romney. 05/17/2019 10:45 AM Medical Record [id number] Patient Account Number: [id number] Date of Birth/Sex: 1946-01-17 (74 y.o. Female) Treating RN: Kela Millin Primary Care Orazio Weller: Vernell Morgans Other Clinician: Referring Bernardette Waldron: Treating Kacen Mellinger/Extender:Robson, Forestine Chute, FERNANDO Weeks in Treatment: 8 Active  Inactive Abuse / Safety / Falls / Self Care Management Nursing Diagnoses: Potential for injury related to transfers Goals: Patient/caregiver will verbalize understanding of skin care regimen Date Initiated: 03/19/2019 Target Resolution Date: 06/07/2019 Goal Status: Active Patient/caregiver will verbalize/demonstrate measures taken to prevent injury and/or falls Date Initiated: 03/19/2019 Target Resolution Date: 06/07/2019 Goal Status: Active Interventions: Assess Activities of Daily Living upon admission and as needed Assess fall risk on admission and as needed Assess: immobility, friction, shearing, incontinence upon admission and as needed Assess impairment of mobility on admission and as needed per policy Assess personal safety and home safety (as indicated) on  admission and as needed Assess self care needs on admission and as needed Notes: Wound/Skin Impairment Nursing Diagnoses: Knowledge deficit related to ulceration/compromised skin integrity Goals: Patient/caregiver will verbalize understanding of skin care regimen Date Initiated: 03/19/2019 Target Resolution Date: 06/07/2019 Goal Status: Active Ulcer/skin breakdown will have a volume reduction of 30% by week 4 Date Initiated: 03/19/2019 Date Inactivated: 05/17/2019 Target Resolution Date: 04/26/2019 Unmet Reason: wound has Goal Status: Unmet become larger Interventions: Assess patient/caregiver ability to obtain necessary supplies Assess patient/caregiver ability to perform ulcer/skin care regimen upon admission and as needed Assess ulceration(s) every visit Notes: Electronic Signature(s) Signed: 05/20/2019 1:40:10 PM By: Kela Millin Entered By: Kela Millin on 05/17/2019 11:30:26 -------------------------------------------------------------------------------- Pain Assessment Details Patient Name: Date of Service: ETHLEEN, FREDERICKS. 05/17/2019 10:45 AM Medical Record [id number] Patient Account Number: [id number] Date of Birth/Sex: [birth year] (74 y.o. Female) Treating RN: Kela Millin Primary Care Braylen Denunzio: Vernell Morgans Other Clinician: Referring Bravlio Luca: Treating Sokha Craker/Extender:Robson, Forestine Chute, Felicita Gage Weeks in Treatment: 8 Active Problems Location of Pain Severity and Description of Pain Patient Has Paino No Site Locations Pain Management and Medication Current Pain Management: Electronic Signature(s) Signed: 05/20/2019 1:40:10 PM By: Kela Millin Signed: 07/10/2019 9:23:26 AM By: Sandre Kitty Entered By: Sandre Kitty on 05/17/2019 11:22:49 -------------------------------------------------------------------------------- Patient/Caregiver Education Details Patient Name: Sandra Romney 2/19/2021andnbsp10:45 Date of  Service: AM Medical Record [id number] Number: Patient Account Number: [id number] Treating RN: Date of Birth/Gender: 03/13/1946 (73 y.o. Kela Millin Female) Other Clinician: Primary Care Treating ARIZA, Paul Half Physician: Physician/Extender: Referring Physician: Jannette Spanner in Treatment: 8 Education Assessment Education Provided To: Patient Education Topics Provided Wound/Skin Impairment: Methods: Explain/Verbal Responses: State content correctly Electronic Signature(s) Signed: 05/20/2019 1:40:10 PM By: Kela Millin Entered By: Kela Millin on 05/17/2019 11:30:39 -------------------------------------------------------------------------------- Wound Assessment Details Patient Name: Date of Service: KEYANA, BRAZZEL. 05/17/2019 10:45 AM Medical Record [id number] Patient Account Number: [id number] Date of Birth/Sex: 02-05-1946 (74 y.o. Female) Treating RN: Kela Millin Primary Care Nekesha Font: Vernell Morgans Other Clinician: Referring Joseguadalupe Stan: Treating Sanjana Folz/Extender:Robson, Forestine Chute, FERNANDO Weeks in Treatment: 8 Wound Status Wound Number: 1 Primary Pressure Ulcer Etiology: Wound Location: Right Ischial Tuberosity Wound Open Wounding Event: Pressure Injury Status: Date Acquired: 01/23/2019 Comorbid Anemia, Chronic Obstructive Pulmonary Weeks Of Treatment: 8 History: Disease (COPD), Congestive Heart Failure, Clustered Wound: No Hypertension, Myocardial Infarction, Type II Diabetes, Gout, Osteoarthritis, Paraplegia Photos Wound Measurements Length: (cm) 7 % Reduction in Width: (cm) 3.5 % Reduction in Depth: (cm) 4 Epithelializat Area: (cm) 19.242 Volume: (cm) 76.969 Wound Description Classification: Category/Stage IV Foul Odor Afte Wound Margin: Well defined, not attached Slough/Fibrino Exudate Amount: Large Exudate Type: Serosanguineous Exudate Color: red, brown Wound Bed Granulation Amount: Large  (67-100%) Granulation Quality: Red, Hyper-granulation Fascia Exposed Necrotic Amount: Small (1-33%) Fat Layer (Sub Necrotic Quality: Adherent Slough Tendon Exposed Muscle Exposed Joint Exposed: Bone Exposed: r Cleansing: No Yes Exposed Structure : No cutaneous  Tissue) Exposed: Yes : No : No No Yes Area: 41.7% Volume: 31.4% ion: None Electronic Signature(s) Signed: 05/17/2019 5:06:31 PM By: Mikeal Hawthorne EMT/HBOT Signed: 05/20/2019 1:40:10 PM By: Kela Millin Entered By: Mikeal Hawthorne on 05/17/2019 14:29:13 -------------------------------------------------------------------------------- Vitals Details Patient Name: Date of Service: Sandra Gravel F. 05/17/2019 10:45 AM Medical Record [id number] Patient Account Number: [id number] Date of Birth/Sex: 1945-07-13 (74 y.o. Female) Treating RN: Kela Millin Primary Care Kamerin Grumbine: Vernell Morgans Other Clinician: Referring Meckenzie Balsley: Treating Aliene Tamura/Extender:Robson, Forestine Chute, FERNANDO Weeks in Treatment: 8 Vital Signs Time Taken: 11:22 Temperature (F): 98.3 Height (in): 67 Pulse (bpm): 75 Weight (lbs): 240 Respiratory Rate (breaths/min): 18 Body Mass Index (BMI): 37.6 Blood Pressure (mmHg): 135/55 Reference Range: 80 - 120 mg / dl Electronic Signature(s) Signed: 07/10/2019 9:23:26 AM By: Sandre Kitty Entered By: Sandre Kitty on 05/17/2019 11:22:44

## 2019-07-19 ENCOUNTER — Encounter (HOSPITAL_BASED_OUTPATIENT_CLINIC_OR_DEPARTMENT_OTHER): Payer: Medicare Other | Admitting: Internal Medicine

## 2019-07-22 ENCOUNTER — Encounter (HOSPITAL_BASED_OUTPATIENT_CLINIC_OR_DEPARTMENT_OTHER): Payer: Medicare Other | Admitting: Internal Medicine

## 2020-03-17 ENCOUNTER — Other Ambulatory Visit (HOSPITAL_COMMUNITY): Payer: Self-pay | Admitting: Emergency Medicine

## 2020-03-17 ENCOUNTER — Other Ambulatory Visit: Payer: Self-pay | Admitting: Emergency Medicine

## 2020-03-17 ENCOUNTER — Other Ambulatory Visit: Payer: Self-pay | Admitting: Adult Health Nurse Practitioner

## 2020-03-17 DIAGNOSIS — N200 Calculus of kidney: Secondary | ICD-10-CM

## 2020-03-17 DIAGNOSIS — R160 Hepatomegaly, not elsewhere classified: Secondary | ICD-10-CM

## 2020-03-23 ENCOUNTER — Ambulatory Visit (HOSPITAL_COMMUNITY): Payer: Medicare Other

## 2020-04-02 ENCOUNTER — Ambulatory Visit (HOSPITAL_COMMUNITY): Payer: Medicare Other

## 2020-04-17 ENCOUNTER — Ambulatory Visit: Payer: Medicare Other | Admitting: Urology

## 2020-04-22 ENCOUNTER — Ambulatory Visit (HOSPITAL_COMMUNITY): Admission: RE | Admit: 2020-04-22 | Payer: Medicare Other | Source: Ambulatory Visit

## 2020-04-22 ENCOUNTER — Encounter (HOSPITAL_COMMUNITY): Payer: Self-pay

## 2020-05-20 ENCOUNTER — Ambulatory Visit (HOSPITAL_COMMUNITY): Payer: Medicare Other

## 2020-05-21 ENCOUNTER — Other Ambulatory Visit: Payer: Self-pay

## 2020-05-21 ENCOUNTER — Ambulatory Visit (HOSPITAL_COMMUNITY)
Admission: RE | Admit: 2020-05-21 | Discharge: 2020-05-21 | Disposition: A | Discharge status: Home / Self Care | Payer: Medicare Other | Source: Ambulatory Visit | Attending: Emergency Medicine | Admitting: Emergency Medicine

## 2020-05-21 DIAGNOSIS — N200 Calculus of kidney: Secondary | ICD-10-CM | POA: Diagnosis present

## 2020-05-21 DIAGNOSIS — R160 Hepatomegaly, not elsewhere classified: Secondary | ICD-10-CM | POA: Insufficient documentation

## 2020-05-21 LAB — POCT I-STAT CREATININE: Creatinine, Ser: 0.6 mg/dL (ref 0.44–1.00)

## 2020-05-21 MED ORDER — IOHEXOL 300 MG/ML  SOLN
100.0000 mL | Freq: Once | INTRAMUSCULAR | Status: AC | PRN
  Administered 2020-05-21: 100 mL via INTRAVENOUS

## 2020-06-01 ENCOUNTER — Ambulatory Visit: Payer: Medicare Other | Admitting: Urology

## 2020-06-03 ENCOUNTER — Telehealth: Payer: Self-pay | Admitting: Internal Medicine

## 2020-06-03 NOTE — Telephone Encounter (Signed)
[facility] called asking if you received the referral for this patient

## 2020-06-03 NOTE — Telephone Encounter (Signed)
Pt has ASAP referral. I put her on with Dr Abbey Chatters for tomorrow. I LMOM for [place] transportation Lamount Cohen) to call me back to verify if she can bring patient tomorrow.

## 2020-06-04 ENCOUNTER — Encounter: Payer: Self-pay | Admitting: Internal Medicine

## 2020-06-04 ENCOUNTER — Other Ambulatory Visit: Payer: Self-pay

## 2020-06-04 ENCOUNTER — Ambulatory Visit (INDEPENDENT_AMBULATORY_CARE_PROVIDER_SITE_OTHER): Payer: Medicare Other | Admitting: Internal Medicine

## 2020-06-04 VITALS — BP 137/86 | HR 67 | Temp 96.8°F | Wt 246.0 lb

## 2020-06-04 DIAGNOSIS — K219 Gastro-esophageal reflux disease without esophagitis: Secondary | ICD-10-CM | POA: Diagnosis not present

## 2020-06-04 DIAGNOSIS — R188 Other ascites: Secondary | ICD-10-CM

## 2020-06-04 DIAGNOSIS — K7460 Unspecified cirrhosis of liver: Secondary | ICD-10-CM | POA: Diagnosis not present

## 2020-06-04 DIAGNOSIS — K5900 Constipation, unspecified: Secondary | ICD-10-CM

## 2020-06-04 NOTE — Patient Instructions (Signed)
We will perform full serological work-up to rule out other causes of your chronic liver disease.  Further recommendations to follow.  Follow-up in 6 weeks  At [facility] we value your feedback. You may receive a survey about your visit today. Please share your experience as we strive to create trusting relationships with our patients to provide genuine, compassionate, quality care.  We appreciate your understanding and patience as we review any laboratory studies, imaging, and other diagnostic tests that are ordered as we care for you. Our office policy is 5 business days for review of these results, and any emergent or urgent results are addressed in a timely manner for your best interest. If you do not hear from our office in 1 week, please contact us.   We also encourage the use of MyChart, which contains your medical information for your review as well. If you are not enrolled in this feature, an access code is on this after visit summary for your convenience. Thank you for allowing [place] to be involved in your care.  It was great to see you today!  I hope you have a great rest of your spring!!    Elon Alas. Abbey Chatters, D.O. Gastroenterology and Hepatology [facility]

## 2020-06-05 DIAGNOSIS — K5900 Constipation, unspecified: Secondary | ICD-10-CM | POA: Insufficient documentation

## 2020-06-05 NOTE — Progress Notes (Signed)
Primary Care Physician:  Hilbert Corrigan, MD Primary Gastroenterologist:  Dr. Abbey Chatters  Chief Complaint  Patient presents with  . Constipation    Has IBS. Doesn't go well for couple weeks then has "blowout" for couple days  . Cirrhosis    HPI:   [name] is a 75 y.o. female who presents to the clinic today as a new patient from her PCP Dr. Mal Amabile for evaluation of newly diagnosed cirrhosis.  Patient underwent CT imaging 05/21/2020 which showed evidence of cirrhosis, portal splenomegaly, and trace perihepatic ascites.  Patient denies any current alcohol use.  States she used to drink on Saturdays years ago.  She does have risk factors for fatty liver disease including obesity, dyslipidemia, diabetes.  No history of IV drug use.  No history of viral hepatitis that she knows of.  Patient is wheelchair/bedbound due to paraplegia.  States this was due to a spinal cord bleed years ago.  She currently resides at skilled nursing facility.  She also has chronic GERD for which she takes omeprazole 40 mg daily.  States this keeps her reflux under good control.  Also has chronic constipation which does not appear to be well controlled.  Takes Colace and will have "washout periods."  Past Medical History:  Diagnosis Date  . Bipolar 1 disorder ([place])   . COPD with asthma ([place])   . Diabetes mellitus without complication ([place])   . GERD (gastroesophageal reflux disease)   . Hypertension   . Obesity   . 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 Pyelonephritis   . Thyroid disease     Past Surgical History:  Procedure Laterality Date  . ABDOMINAL HYSTERECTOMY    . BACK SURGERY      Current Outpatient Medications  Medication Sig Dispense Refill  . acetaminophen (TYLENOL) 325 MG tablet Take 325 mg by mouth 2 (two) times daily. *May take one every 6 hours as needed for pain    . albuterol (VENTOLIN HFA) 108 (90 Base) MCG/ACT inhaler Inhale 2 puffs into the lungs every 8 (eight) hours as needed  (Asthma).     . allopurinol (ZYLOPRIM) 100 MG tablet Take 100 mg by mouth daily.     Marland Kitchen alum & mag hydroxide-simeth (MAALOX/MYLANTA) 200-200-20 MG/5ML suspension Take 20 mLs by mouth every 4 (four) hours as needed for indigestion or heartburn.     . AMINO ACIDS-PROTEIN HYDROLYS PO Take 30 mLs by mouth in the morning and at bedtime.    Marland Kitchen aspirin EC 81 MG tablet Take 81 mg by mouth daily.    Marland Kitchen aspirin-acetaminophen-caffeine (EXCEDRIN MIGRAINE) 250-250-65 MG tablet Take 1 tablet by mouth every 8 (eight) hours as needed for headache.    Marland Kitchen atorvastatin (LIPITOR) 10 MG tablet Take 10 mg by mouth daily.    Marland Kitchen b complex vitamins tablet Take 1 tablet by mouth daily.    Marland Kitchen buPROPion (WELLBUTRIN XL) 300 MG 24 hr tablet Take 300 mg by mouth daily.     . calcium citrate-vitamin D 500-400 MG-UNIT Chew 1 tablet by mouth 2 (two) times daily.    . carvedilol (COREG) 12.5 MG tablet Take 12.5 mg by mouth 2 (two) times daily with a meal.    . Cranberry 500 MG TABS Take 1 capsule by mouth daily.    Marland Kitchen dextromethorphan-guaiFENesin (MUCINEX DM) 30-600 MG per 12 hr tablet Take 1 tablet by mouth every 12 (twelve) hours as needed for cough.     . diazepam (VALIUM) 2 MG tablet  Take 2 mg by mouth in the morning, at noon, and at bedtime.    . docusate sodium (COLACE) 100 MG capsule Take 100 mg by mouth at bedtime.    Marland Kitchen ezetimibe (ZETIA) 10 MG tablet Take 10 mg by mouth at bedtime.     . ferrous sulfate 325 (65 FE) MG tablet Take 325 mg by mouth 2 (two) times daily with a meal.    . fluticasone (FLONASE) 50 MCG/ACT nasal spray Place 2 sprays into both nostrils daily.     . furosemide (LASIX) 40 MG tablet Take 40 mg by mouth daily.    Marland Kitchen gabapentin (NEURONTIN) 300 MG capsule Take 300 mg by mouth every 8 (eight) hours.    Marland Kitchen guaifenesin (ROBITUSSIN) 100 MG/5ML syrup Take 100 mg by mouth every 4 (four) hours as needed for cough.    Marland Kitchen ipratropium-albuterol (DUONEB) 0.5-2.5 (3) MG/3ML SOLN Take 3 mLs by nebulization every 6 (six) hours  as needed (shortness of breath).     Javier Docker Oil 300 MG CAPS Take 1 capsule by mouth 2 (two) times daily.    . Lactobacillus (ACIDOPHILUS PO) Take 1 tablet by mouth in the morning and at bedtime.    Marland Kitchen levothyroxine (SYNTHROID) 150 MCG tablet Take 150 mcg by mouth daily.    Marland Kitchen loratadine (CLARITIN) 10 MG tablet Take 10 mg by mouth daily.    . magnesium oxide (MAG-OX) 400 MG tablet Take 400 mg by mouth 2 (two) times daily.    . Menthol-Zinc Oxide (CALMOSEPTINE) 0.44-20.6 % OINT Apply topically every 8 (eight) hours as needed.    . mirabegron ER (MYRBETRIQ) 25 MG TB24 tablet Take 25 mg by mouth daily.    . Multiple Vitamin (MULTIVITAMIN) tablet Take 1 tablet by mouth daily.    Marland Kitchen omeprazole (PRILOSEC) 40 MG capsule Take 40 mg by mouth daily.    Marland Kitchen oxycodone (OXY-IR) 5 MG capsule Take 5 mg by mouth in the morning and at bedtime.    . potassium chloride 20 MEQ TBCR Take 10 mEq by mouth daily.    . promethazine (PHENERGAN) 12.5 MG tablet Take 12.5 mg by mouth every 6 (six) hours as needed for nausea or vomiting.    . pseudoephedrine (SUDAFED) 30 MG tablet Take 30 mg by mouth every 8 (eight) hours as needed for congestion.    . rizatriptan (MAXALT) 10 MG tablet Take 1 tablet by mouth every 12 (twelve) hours as needed for migraine.    . sodium chloride (OCEAN) 0.65 % SOLN nasal spray Place 1 spray into both nostrils as needed for congestion.    Marland Kitchen tiZANidine (ZANAFLEX) 2 MG tablet Take 2 mg by mouth in the morning, at noon, in the evening, and at bedtime.    . topiramate (TOPAMAX) 50 MG tablet Take 50 mg by mouth 2 (two) times daily.    . traZODone (DESYREL) 50 MG tablet Take 50 mg by mouth at bedtime.    . vitamin C (ASCORBIC ACID) 500 MG tablet Take 500 mg by mouth 2 (two) times daily.    . Vitamin D, Ergocalciferol, (DRISDOL) 1.25 MG (50000 UNIT) CAPS capsule Take 50,000 Units by mouth every 7 (seven) days.    . Wound Dressings (PROMOGRAN EX) Apply topically daily.     No current facility-administered  medications for this visit.    Allergies as of 06/04/2020 - Review Complete 06/04/2020  Allergen Reaction Noted  . Invanz [ertapenem] Other (See Comments) 04/03/2016  . Propranolol hcl    . Rocephin [  ceftriaxone] Hives 09/08/2014  . Vancomycin Hives 09/08/2014    Family History  Adopted: Yes  Problem Relation Age of Onset  . Heart disease Mother     Social History   Socioeconomic History  . Marital status: Single    Spouse name: Not on file  . Number of children: Not on file  . Years of education: Not on file  . Highest education level: Not on file  Occupational History  . Not on file  Tobacco Use  . Smoking status: Former Research scientist (life sciences)  . Smokeless tobacco: Never Used  Substance and Sexual Activity  . Alcohol use: Not Currently    Alcohol/week: 0.0 standard drinks    Comment: "weekend drinker, Saturday night drunk" years ago  . Drug use: No  . Sexual activity: Never  Other Topics Concern  . Not on file  Social History Narrative  . Not on file   Social Determinants of Health   Financial Resource Strain: Not on file  Food Insecurity: Not on file  Transportation Needs: Not on file  Physical Activity: Not on file  Stress: Not on file  Social Connections: Not on file  Intimate Partner Violence: Not on file    Subjective: Review of Systems  Constitutional: Negative for chills and fever.  HENT: Negative for congestion and hearing loss.   Eyes: Negative for blurred vision and double vision.  Respiratory: Negative for cough and shortness of breath.   Cardiovascular: Negative for chest pain and palpitations.  Gastrointestinal: Positive for abdominal pain and constipation. Negative for blood in stool, diarrhea, heartburn, melena and vomiting.  Genitourinary: Negative for dysuria and urgency.  Musculoskeletal: Negative for joint pain and myalgias.  Skin: Negative for itching and rash.  Neurological: Negative for dizziness and headaches.  Psychiatric/Behavioral: Negative  for depression. The patient is not nervous/anxious.        Objective: BP 137/86   Pulse 67   Temp (!) 96.8 F (36 C) (Temporal)   Wt 246 lb (111.6 kg) Comment: last weight stated by pt  BMI 38.53 kg/m  Physical Exam Constitutional:      Appearance: Normal appearance. She is obese.     Comments: Patient on stretcher  HENT:     Head: Normocephalic and atraumatic.  Eyes:     Extraocular Movements: Extraocular movements intact.     Conjunctiva/sclera: Conjunctivae normal.  Cardiovascular:     Rate and Rhythm: Normal rate and regular rhythm.  Pulmonary:     Effort: Pulmonary effort is normal.     Breath sounds: Normal breath sounds.  Abdominal:     General: Bowel sounds are normal.     Palpations: Abdomen is soft.  Musculoskeletal:        General: No swelling. Normal range of motion.     Cervical back: Normal range of motion and neck supple.  Skin:    General: Skin is warm and dry.     Coloration: Skin is not jaundiced.  Neurological:     Mental Status: She is alert and oriented to person, place, and time.     Comments: Paralyzed from waist down  Psychiatric:        Mood and Affect: Mood normal.        Behavior: Behavior normal.      Assessment: *Cirrhosis-new diagnosis *Ascites-mild *Obesity *GERD *Constipation  Plan: Discussed cirrhosis in depth with patient today.  Etiology likely nonalcoholic fatty liver disease given risk factors including obesity, diabetes, dyslipidemia.  That being said we will need to perform  full serological work-up to rule out other causes.  I will also check meld labs today as well.  Patient with trace perihepatic ascites on CT imaging.  Currently taking Lasix 40 mg daily.  We may consider adding Aldactone in the future if need be.  We will need to schedule patient for EGD for variceal screening.  We will discuss further on follow-up visit.  Repeat ultrasound for [facility] screening due September 2022.  No evidence of hepatic  encephalopathy currently.  We will continue to monitor  Continue on omeprazole for chronic GERD which is appears to be well controlled.  Continue Colace for chronic constipation.  Patient follow-up in 6 weeks or sooner if needed.  06/05/2020 10:45 AM   Disclaimer: This note was dictated with voice recognition software. Similar sounding words can inadvertently be transcribed and may not be corrected upon review.

## 2020-07-09 ENCOUNTER — Ambulatory Visit: Payer: Medicare Other | Admitting: Internal Medicine

## 2020-07-21 ENCOUNTER — Other Ambulatory Visit: Payer: Medicare Other

## 2020-07-29 ENCOUNTER — Ambulatory Visit: Payer: Medicare Other | Admitting: Urology

## 2020-08-26 ENCOUNTER — Ambulatory Visit: Payer: Medicare Other | Admitting: Internal Medicine

## 2020-09-02 ENCOUNTER — Ambulatory Visit: Payer: Medicare Other | Admitting: Internal Medicine

## 2021-02-23 ENCOUNTER — Other Ambulatory Visit: Payer: Self-pay

## 2021-02-23 ENCOUNTER — Ambulatory Visit (INDEPENDENT_AMBULATORY_CARE_PROVIDER_SITE_OTHER): Payer: Medicare Other | Admitting: Urology

## 2021-02-23 ENCOUNTER — Encounter: Payer: Self-pay | Admitting: Urology

## 2021-02-23 VITALS — BP 133/81 | HR 64

## 2021-02-23 DIAGNOSIS — R32 Unspecified urinary incontinence: Secondary | ICD-10-CM | POA: Diagnosis not present

## 2021-02-23 DIAGNOSIS — R8290 Unspecified abnormal findings in urine: Secondary | ICD-10-CM

## 2021-02-23 DIAGNOSIS — N319 Neuromuscular dysfunction of bladder, unspecified: Secondary | ICD-10-CM | POA: Diagnosis not present

## 2021-02-23 DIAGNOSIS — N200 Calculus of kidney: Secondary | ICD-10-CM

## 2021-02-23 DIAGNOSIS — G8220 Paraplegia, unspecified: Secondary | ICD-10-CM | POA: Diagnosis not present

## 2021-02-23 LAB — URINALYSIS, ROUTINE W REFLEX MICROSCOPIC
Bilirubin, UA: NEGATIVE
Glucose, UA: NEGATIVE
Ketones, UA: NEGATIVE
Nitrite, UA: NEGATIVE
Specific Gravity, UA: 1.015 (ref 1.005–1.030)
Urobilinogen, Ur: 0.2 mg/dL (ref 0.2–1.0)
pH, UA: 8.5 — ABNORMAL HIGH (ref 5.0–7.5)

## 2021-02-23 LAB — MICROSCOPIC EXAMINATION: Renal Epithel, UA: NONE SEEN /hpf

## 2021-02-23 NOTE — Addendum Note (Signed)
Addended by: Iris Pert on: 02/23/2021 12:21 PM   Modules accepted: Orders

## 2021-02-23 NOTE — Progress Notes (Signed)
Assessment: 1. Neurogenic bladder   2. Urinary incontinence, unspecified type   3. Nephrolithiasis   4. Paraplegia ([place])   5. Abnormal urine findings     Plan: I reviewed the patient's records from the nursing home. I discussed suprapubic catheter placement with the patient.  I am concerned that given her body habitus with her large pannus, a suprapubic tube may not be a good option for her as it may cause issues with the abdominal area and be difficult to change.  I also advised her that the suprapubic tube will not resolve her incontinence issues and in fact could make them more significant. I have recommended further evaluation with CT urogram and cystoscopy. She also would benefit from urodynamics prior to any interventions. Urine culture sent today.  We will contact with results and recommendations for treatment.  Chief Complaint:  Chief Complaint  Patient presents with   Urinary Incontinence    History of Present Illness:  [name] is a 75 y.o. year old female who is seen in consultation from Hilbert Corrigan, MD for evaluation of neurogenic bladder, possible SP tube placement, and nephrolithiasis.  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  Her Foley catheter is changed approximately every month or as needed.  She continues to have some incontinence around the catheter.  She has been on Myrbetriq 25 mg twice daily for a number of years.  She has not had significant problems with urinary tract infections.  She is essentially bedbound.  CT imaging from 2/22 showed bilateral nephrolithiasis with a 3.7 cm stone in the left upper pole without obstruction.  She has a remote history of a ureteral calculus requiring ureteroscopy.  No recent stone symptoms.   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[place])    Pyelonephritis    Thyroid disease     Past Surgical History:  Past Surgical History:  Procedure Laterality Date   ABDOMINAL HYSTERECTOMY     BACK SURGERY      Allergies:  Allergies  Allergen Reactions   Invanz [Ertapenem] Other (See Comments)    Patient states allergy but is not certain    Propranolol Hcl    Rocephin [Ceftriaxone] Hives   Vancomycin Hives    Family History:  Family History  Adopted: Yes  Problem Relation Age of Onset   Heart disease Mother     Social History:  Social History   Tobacco Use   Smoking status: Former   Smokeless tobacco: Never  Substance Use Topics   Alcohol use: Not Currently    Alcohol/week: 0.0 standard drinks    Comment: "weekend drinker, Saturday night drunk" years ago   Drug use: No    Review of symptoms:  Constitutional:  Negative for unexplained weight loss, night sweats, fever, chills ENT:  Negative for nose bleeds, sinus pain, painful swallowing CV:  Negative for chest pain, shortness of breath, exercise intolerance, palpitations, loss of consciousness Resp:  Negative for cough, wheezing, shortness of breath GI:  Negative for nausea, vomiting, diarrhea, bloody stools  GU:  Positives noted in HPI; otherwise negative for gross hematuria, dysuria Neuro:  Negative for seizures, poor balance, limb weakness, slurred speech Psych:  Negative for lack of energy, depression, anxiety Endocrine:  Negative for polydipsia, polyuria, symptoms of hypoglycemia (dizziness, hunger, sweating) Hematologic:  Negative for anemia, purpura, petechia, prolonged or excessive bleeding, use of anticoagulants  Allergic:  Negative for difficulty breathing or choking as a result of exposure to  anything; no shellfish allergy; no allergic response (rash/itch) to materials, foods  Physical exam: BP 133/81   Pulse 64  GENERAL APPEARANCE:  Well appearing, well developed, well nourished, NAD HEENT: Atraumatic, Normocephalic, oropharynx clear. NECK: Supple without lymphadenopathy or thyromegaly. LUNGS: Clear to auscultation bilaterally. HEART: Regular Rate and Rhythm without murmurs, gallops, or rubs. ABDOMEN: Soft, non-tender, No Masses. Protuberant abdomen. EXTREMITIES:  Without clubbing, cyanosis, or edema. NEUROLOGIC:  Alert and oriented x 3,  CN II-XII grossly intact.  MENTAL STATUS:  Appropriate. BACK:  Non-tender to palpation.  No CVAT SKIN:  Warm, dry and intact.   GU:  foley draining yellow urine  Results: U/A:  11-30 WBC, 3-10 RBC, few bacteria

## 2021-02-23 NOTE — Progress Notes (Signed)
Urological Symptom Review  Patient is experiencing the following symptoms: Patient is paralyzed and states she is unsure of any urinary symptoms     Review of Systems  Gastrointestinal (upper)  : N/A  Gastrointestinal (lower) : Negative for lower GI symptoms  Constitutional : Negative for symptoms  Skin: Negative for skin symptoms  Eyes: Negative for eye symptoms  Ear/Nose/Throat : Sinus problems  Hematologic/Lymphatic: Negative for Hematologic/Lymphatic symptoms  Cardiovascular : Negative for cardiovascular symptoms  Respiratory : Cough Shortness of breath  Endocrine: Negative for endocrine symptoms  Musculoskeletal: Back pain Joint pain  Neurological: Negative for neurological symptoms  Psychologic: Negative for psychiatric symptoms

## 2021-02-25 LAB — URINE CULTURE

## 2021-03-01 ENCOUNTER — Telehealth: Payer: Self-pay

## 2021-03-01 MED ORDER — CIPROFLOXACIN HCL 500 MG PO TABS: 500.0000 mg | ORAL_TABLET | Freq: Two times a day (BID) | ORAL | 0 refills | Status: AC

## 2021-03-01 NOTE — Telephone Encounter (Signed)
-----   Message from Primus Bravo, MD sent at 02/26/2021  1:35 PM EST ----- Please give order for nursing home for Cipro 500 mg PO BID to begin 3 days prior to her appt on 03/25/21.

## 2021-03-01 NOTE — Telephone Encounter (Signed)
Spoke with Remo Lipps at [facility] concerning patient. Orders faxed to [phone]. Called Remo Lipps RN supervisor to verify faxed was received.

## 2021-03-23 ENCOUNTER — Other Ambulatory Visit: Payer: Medicare Other | Admitting: Urology

## 2021-03-25 ENCOUNTER — Other Ambulatory Visit: Payer: Medicare Other | Admitting: Urology

## 2021-03-25 NOTE — Progress Notes (Deleted)
Assessment: 1. Urinary incontinence, unspecified type   2. Neurogenic bladder   3. Nephrolithiasis   4. Paraplegia ([place])     Plan: I discussed suprapubic catheter placement with the patient.  I am concerned that given her body habitus with her large pannus, a suprapubic tube may not be a good option for her as it may cause issues with the abdominal area and be difficult to change.  I also advised her that the suprapubic tube will not resolve her incontinence issues and in fact could make them more significant. I have recommended further evaluation with CT urogram and cystoscopy. She also would benefit from urodynamics prior to any interventions. Urine culture sent today.  We will contact with results and recommendations for treatment.  Chief Complaint:  No chief complaint on file.   History of Present Illness:  [name] is a 75 y.o. year old female who is seen for further evaluation of neurogenic bladder and nephrolithiasis.  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  Her Foley catheter is changed approximately every month or as needed.  She continues to have some incontinence around the catheter.  She has been on Myrbetriq 25 mg twice daily for a number of years.  She has not had significant problems with urinary tract infections.  She is essentially bedbound.  CT imaging from 2/22 showed bilateral nephrolithiasis with a 3.7 cm stone in the left upper pole without obstruction.  She has a remote history of a ureteral calculus requiring ureteroscopy.  No recent stone symptoms. Urine culture from 02/23/2021 grew >2 organisms.  She presents today for cystoscopy.  A CT urogram was ordered but has not been completed today.  Portions of the above documentation were copied from a  prior visit for review purposes only.   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[place])    Pyelonephritis    Thyroid disease     Past Surgical History:  Past Surgical History:  Procedure Laterality Date   ABDOMINAL HYSTERECTOMY     BACK SURGERY      Allergies:  Allergies  Allergen Reactions   Invanz [Ertapenem] Other (See Comments)    Patient states allergy but is not certain    Propranolol Hcl    Rocephin [Ceftriaxone] Hives   Vancomycin Hives    Family History:  Family History  Adopted: Yes  Problem Relation Age of Onset   Heart disease Mother     Social History:  Social History   Tobacco Use   Smoking status: Former   Smokeless tobacco: Never  Substance Use Topics   Alcohol use: Not Currently    Alcohol/week: 0.0 standard drinks    Comment: "weekend drinker, Saturday night drunk" years ago   Drug use: No    ROS: Constitutional:  Negative for fever, chills, weight loss CV: Negative for chest pain, previous MI, hypertension Respiratory:  Negative for shortness of breath, wheezing, sleep apnea, frequent cough GI:  Negative for nausea, vomiting, bloody stool, GERD  Physical exam: There were no vitals taken for  this visit. ***  Results: ***  Procedure:  Flexible Cystourethroscopy  Pre-operative Diagnosis: {cysto diagnosis:26394}  Post-operative Diagnosis: {cysto diagnosis:26394}  Anesthesia:  local with lidocaine jelly  Surgical Narrative:  After appropriate informed consent was obtained, the patient was prepped and draped in the usual sterile fashion in the supine position.  He was correctly identified and the proper procedure delineated prior to proceeding.  Sterile lidocaine gel was instilled in the urethra. The flexible  cystoscope was introduced without difficulty.  Findings:  Anterior urethra: {anterior urethral findings:26395}  Posterior urethra: {post urethral findings:26396}  Bladder: {bladder findings:26397}  Ureteral orifices: {Normal/Abnormal Appearance:21344::"normal"}  Additional findings:  Saline bladder wash for cytology {WAS/WAS [phone]::"was not"} performed.    The cystoscope was then removed.  The patient tolerated the procedure well.

## 2021-04-15 ENCOUNTER — Encounter: Payer: Self-pay | Admitting: *Deleted

## 2021-05-18 ENCOUNTER — Other Ambulatory Visit: Payer: Medicare Other | Admitting: Urology

## 2021-05-18 DIAGNOSIS — R8290 Unspecified abnormal findings in urine: Secondary | ICD-10-CM

## 2021-05-18 DIAGNOSIS — N319 Neuromuscular dysfunction of bladder, unspecified: Secondary | ICD-10-CM

## 2021-05-18 DIAGNOSIS — R32 Unspecified urinary incontinence: Secondary | ICD-10-CM

## 2021-05-18 NOTE — Progress Notes (Unsigned)
Assessment: 1. Urinary incontinence, unspecified type   2. Neurogenic bladder     Plan: I reviewed the patient's records from the nursing home. I discussed suprapubic catheter placement with the patient.  I am concerned that given her body habitus with her large pannus, a suprapubic tube may not be a good option for her as it may cause issues with the abdominal area and be difficult to change.  I also advised her that the suprapubic tube will not resolve her incontinence issues and in fact could make them more significant. I have recommended further evaluation with CT urogram and cystoscopy. She also would benefit from urodynamics prior to any interventions. Urine culture sent today.  We will contact with results and recommendations for treatment.  Chief Complaint:  No chief complaint on file.   History of Present Illness:  [name] is a 76 y.o. year old female who is seen for further evaluation of neurogenic bladder, possible SP tube placement, and nephrolithiasis.  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  Her Foley catheter is changed approximately every month or as needed.  She continues to have some incontinence around the catheter.  She has been on Myrbetriq 25 mg twice daily for a number of years.  She has not had significant problems with urinary tract infections.  She is essentially bedbound.  CT imaging from 2/22 showed bilateral nephrolithiasis with a 3.7 cm stone in the left upper pole without obstruction.  She has a remote history of a ureteral calculus requiring ureteroscopy.  No recent stone symptoms. Given her body habitus with a large pannus, I did not feel that suprapubic tube placement would be a good option for her.  Urine culture from 11/22 grew 50-100 K  colonies of greater than 2 organisms. Further evaluation with CT urogram and cystoscopy recommended.  Portions of the above documentation were copied from a prior visit for review purposes only.   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[place])    Pyelonephritis    Thyroid disease     Past Surgical History:  Past Surgical History:  Procedure Laterality Date   ABDOMINAL HYSTERECTOMY     BACK SURGERY      Allergies:  Allergies  Allergen Reactions   Invanz [Ertapenem] Other (See Comments)    Patient states allergy but is not certain    Propranolol Hcl    Rocephin [Ceftriaxone] Hives   Vancomycin Hives    Family History:  Family History  Adopted: Yes  Problem Relation Age of Onset   Heart disease Mother     Social History:  Social History   Tobacco Use   Smoking status: Former   Smokeless tobacco: Never  Substance Use Topics   Alcohol use: Not Currently    Alcohol/week: 0.0 standard drinks    Comment: "weekend drinker, Saturday night drunk" years ago   Drug use: No    ROS: Constitutional:  Negative for fever, chills, weight loss CV: Negative for chest pain, previous MI, hypertension Respiratory:  Negative for shortness of  breath, wheezing, sleep apnea, frequent cough GI:  Negative for nausea, vomiting, bloody stool, GERD  Physical exam: There were no vitals taken for this visit. ***  Results: U/A:

## 2021-05-25 ENCOUNTER — Other Ambulatory Visit: Payer: Medicare Other | Admitting: Urology

## 2021-07-21 ENCOUNTER — Other Ambulatory Visit: Payer: Medicare Other | Admitting: Urology

## 2021-08-03 ENCOUNTER — Encounter (HOSPITAL_BASED_OUTPATIENT_CLINIC_OR_DEPARTMENT_OTHER): Payer: Medicare Other | Admitting: General Surgery

## 2021-08-03 ENCOUNTER — Encounter (HOSPITAL_BASED_OUTPATIENT_CLINIC_OR_DEPARTMENT_OTHER): Payer: Medicare Other | Admitting: Internal Medicine

## 2021-08-19 ENCOUNTER — Other Ambulatory Visit: Payer: Medicare Other | Admitting: Urology

## 2021-11-14 ENCOUNTER — Emergency Department (HOSPITAL_COMMUNITY): Payer: Medicare Other

## 2021-11-14 ENCOUNTER — Encounter (HOSPITAL_COMMUNITY): Payer: Self-pay | Admitting: Emergency Medicine

## 2021-11-14 ENCOUNTER — Other Ambulatory Visit: Payer: Self-pay

## 2021-11-14 ENCOUNTER — Encounter (HOSPITAL_COMMUNITY): Payer: Self-pay

## 2021-11-14 ENCOUNTER — Inpatient Hospital Stay (HOSPITAL_COMMUNITY)
Admission: EM | Admit: 2021-11-14 | Discharge: 2021-11-19 | DRG: 871 | Disposition: A | Discharge status: Skilled Nursing Facility | Payer: Medicare Other | Source: Skilled Nursing Facility | Attending: Family Medicine | Admitting: Family Medicine

## 2021-11-14 DIAGNOSIS — J9601 Acute respiratory failure with hypoxia: Secondary | ICD-10-CM | POA: Diagnosis not present

## 2021-11-14 DIAGNOSIS — Z87891 Personal history of nicotine dependence: Secondary | ICD-10-CM

## 2021-11-14 DIAGNOSIS — J449 Chronic obstructive pulmonary disease, unspecified: Secondary | ICD-10-CM | POA: Diagnosis present

## 2021-11-14 DIAGNOSIS — L89159 Pressure ulcer of sacral region, unspecified stage: Secondary | ICD-10-CM | POA: Diagnosis present

## 2021-11-14 DIAGNOSIS — Z7401 Bed confinement status: Secondary | ICD-10-CM

## 2021-11-14 DIAGNOSIS — E079 Disorder of thyroid, unspecified: Secondary | ICD-10-CM | POA: Diagnosis not present

## 2021-11-14 DIAGNOSIS — L89320 Pressure ulcer of left buttock, unstageable: Secondary | ICD-10-CM | POA: Diagnosis present

## 2021-11-14 DIAGNOSIS — G4710 Hypersomnia, unspecified: Secondary | ICD-10-CM | POA: Diagnosis present

## 2021-11-14 DIAGNOSIS — Z6838 Body mass index (BMI) 38.0-38.9, adult: Secondary | ICD-10-CM

## 2021-11-14 DIAGNOSIS — R6521 Severe sepsis with septic shock: Secondary | ICD-10-CM | POA: Diagnosis present

## 2021-11-14 DIAGNOSIS — N390 Urinary tract infection, site not specified: Secondary | ICD-10-CM | POA: Diagnosis present

## 2021-11-14 DIAGNOSIS — K219 Gastro-esophageal reflux disease without esophagitis: Secondary | ICD-10-CM | POA: Diagnosis present

## 2021-11-14 DIAGNOSIS — I959 Hypotension, unspecified: Secondary | ICD-10-CM | POA: Diagnosis present

## 2021-11-14 DIAGNOSIS — I10 Essential (primary) hypertension: Secondary | ICD-10-CM | POA: Diagnosis present

## 2021-11-14 DIAGNOSIS — Z79899 Other long term (current) drug therapy: Secondary | ICD-10-CM

## 2021-11-14 DIAGNOSIS — E876 Hypokalemia: Secondary | ICD-10-CM | POA: Diagnosis present

## 2021-11-14 DIAGNOSIS — Z90710 Acquired absence of both cervix and uterus: Secondary | ICD-10-CM

## 2021-11-14 DIAGNOSIS — L89890 Pressure ulcer of other site, unstageable: Secondary | ICD-10-CM | POA: Diagnosis present

## 2021-11-14 DIAGNOSIS — N179 Acute kidney failure, unspecified: Secondary | ICD-10-CM | POA: Diagnosis present

## 2021-11-14 DIAGNOSIS — Z79890 Hormone replacement therapy (postmenopausal): Secondary | ICD-10-CM

## 2021-11-14 DIAGNOSIS — A419 Sepsis, unspecified organism: Principal | ICD-10-CM | POA: Diagnosis present

## 2021-11-14 DIAGNOSIS — E119 Type 2 diabetes mellitus without complications: Secondary | ICD-10-CM | POA: Diagnosis present

## 2021-11-14 DIAGNOSIS — G9340 Encephalopathy, unspecified: Secondary | ICD-10-CM | POA: Diagnosis not present

## 2021-11-14 DIAGNOSIS — A4150 Gram-negative sepsis, unspecified: Principal | ICD-10-CM

## 2021-11-14 DIAGNOSIS — J9621 Acute and chronic respiratory failure with hypoxia: Secondary | ICD-10-CM | POA: Diagnosis present

## 2021-11-14 DIAGNOSIS — I110 Hypertensive heart disease with heart failure: Secondary | ICD-10-CM | POA: Diagnosis present

## 2021-11-14 DIAGNOSIS — I509 Heart failure, unspecified: Secondary | ICD-10-CM | POA: Diagnosis present

## 2021-11-14 DIAGNOSIS — G9341 Metabolic encephalopathy: Secondary | ICD-10-CM | POA: Diagnosis present

## 2021-11-14 DIAGNOSIS — G8220 Paraplegia, unspecified: Secondary | ICD-10-CM | POA: Diagnosis present

## 2021-11-14 DIAGNOSIS — T83511D Infection and inflammatory reaction due to indwelling urethral catheter, subsequent encounter: Secondary | ICD-10-CM | POA: Diagnosis not present

## 2021-11-14 DIAGNOSIS — Z8249 Family history of ischemic heart disease and other diseases of the circulatory system: Secondary | ICD-10-CM

## 2021-11-14 DIAGNOSIS — E785 Hyperlipidemia, unspecified: Secondary | ICD-10-CM | POA: Diagnosis present

## 2021-11-14 DIAGNOSIS — E860 Dehydration: Secondary | ICD-10-CM | POA: Diagnosis present

## 2021-11-14 DIAGNOSIS — L89314 Pressure ulcer of right buttock, stage 4: Secondary | ICD-10-CM | POA: Diagnosis present

## 2021-11-14 DIAGNOSIS — T83511A Infection and inflammatory reaction due to indwelling urethral catheter, initial encounter: Secondary | ICD-10-CM | POA: Diagnosis not present

## 2021-11-14 DIAGNOSIS — R7881 Bacteremia: Secondary | ICD-10-CM | POA: Diagnosis not present

## 2021-11-14 DIAGNOSIS — E039 Hypothyroidism, unspecified: Secondary | ICD-10-CM | POA: Diagnosis present

## 2021-11-14 DIAGNOSIS — I953 Hypotension of hemodialysis: Secondary | ICD-10-CM | POA: Diagnosis not present

## 2021-11-14 DIAGNOSIS — L8990 Pressure ulcer of unspecified site, unspecified stage: Secondary | ICD-10-CM | POA: Insufficient documentation

## 2021-11-14 DIAGNOSIS — R32 Unspecified urinary incontinence: Secondary | ICD-10-CM | POA: Diagnosis present

## 2021-11-14 DIAGNOSIS — F319 Bipolar disorder, unspecified: Secondary | ICD-10-CM | POA: Diagnosis present

## 2021-11-14 DIAGNOSIS — Z20822 Contact with and (suspected) exposure to covid-19: Secondary | ICD-10-CM | POA: Diagnosis present

## 2021-11-14 DIAGNOSIS — Z881 Allergy status to other antibiotic agents status: Secondary | ICD-10-CM

## 2021-11-14 DIAGNOSIS — Z7982 Long term (current) use of aspirin: Secondary | ICD-10-CM

## 2021-11-14 LAB — BLOOD GAS, VENOUS
Acid-base deficit: 11.9 mmol/L — ABNORMAL HIGH (ref 0.0–2.0)
Bicarbonate: 14.7 mmol/L — ABNORMAL LOW (ref 20.0–28.0)
Drawn by: 1517
FIO2: 40 %
O2 Saturation: 49.6 %
Patient temperature: 38.5
pCO2, Ven: 37 mmHg — ABNORMAL LOW (ref 44–60)
pH, Ven: 7.21 — ABNORMAL LOW (ref 7.25–7.43)
pO2, Ven: 36 mmHg (ref 32–45)

## 2021-11-14 LAB — CBC WITH DIFFERENTIAL/PLATELET
Abs Immature Granulocytes: 0.02 10*3/uL (ref 0.00–0.07)
Basophils Absolute: 0.1 10*3/uL (ref 0.0–0.1)
Basophils Relative: 1 %
Eosinophils Absolute: 0.1 10*3/uL (ref 0.0–0.5)
Eosinophils Relative: 1 %
HCT: 42.5 % (ref 36.0–46.0)
Hemoglobin: 13.9 g/dL (ref 12.0–15.0)
Immature Granulocytes: 0 %
Lymphocytes Relative: 9 %
Lymphs Abs: 0.8 10*3/uL (ref 0.7–4.0)
MCH: 34.6 pg — ABNORMAL HIGH (ref 26.0–34.0)
MCHC: 32.7 g/dL (ref 30.0–36.0)
MCV: 105.7 fL — ABNORMAL HIGH (ref 80.0–100.0)
Monocytes Absolute: 0.2 10*3/uL (ref 0.1–1.0)
Monocytes Relative: 2 %
Neutro Abs: 7.8 10*3/uL — ABNORMAL HIGH (ref 1.7–7.7)
Neutrophils Relative %: 87 %
Platelets: 353 10*3/uL (ref 150–400)
RBC: 4.02 MIL/uL (ref 3.87–5.11)
RDW: 13.5 % (ref 11.5–15.5)
WBC: 8.9 10*3/uL (ref 4.0–10.5)
nRBC: 0 % (ref 0.0–0.2)

## 2021-11-14 LAB — URINALYSIS, ROUTINE W REFLEX MICROSCOPIC
Bacteria, UA: NONE SEEN
Bilirubin Urine: NEGATIVE
Glucose, UA: NEGATIVE mg/dL
Ketones, ur: NEGATIVE mg/dL
Nitrite: NEGATIVE
Protein, ur: 30 mg/dL — AB
Specific Gravity, Urine: 1.029 (ref 1.005–1.030)
WBC, UA: >50 WBC/hpf — ABNORMAL HIGH (ref 0–5)
pH: 6 (ref 5.0–8.0)

## 2021-11-14 LAB — COMPREHENSIVE METABOLIC PANEL
ALT: 29 U/L (ref 0–44)
AST: 46 U/L — ABNORMAL HIGH (ref 15–41)
Albumin: 2.8 g/dL — ABNORMAL LOW (ref 3.5–5.0)
Alkaline Phosphatase: 139 U/L — ABNORMAL HIGH (ref 38–126)
Anion gap: 12 (ref 5–15)
BUN: 70 mg/dL — ABNORMAL HIGH (ref 8–23)
CO2: 14 mmol/L — ABNORMAL LOW (ref 22–32)
Calcium: 8.5 mg/dL — ABNORMAL LOW (ref 8.9–10.3)
Chloride: 98 mmol/L (ref 98–111)
Creatinine, Ser: 1.61 mg/dL — ABNORMAL HIGH (ref 0.44–1.00)
GFR, Estimated: 34 mL/min — ABNORMAL LOW (ref >60)
Glucose, Bld: 153 mg/dL — ABNORMAL HIGH (ref 70–99)
Potassium: 4.9 mmol/L (ref 3.5–5.1)
Sodium: 124 mmol/L — ABNORMAL LOW (ref 135–145)
Total Bilirubin: 1 mg/dL (ref 0.3–1.2)
Total Protein: 6.6 g/dL (ref 6.5–8.1)

## 2021-11-14 LAB — BRAIN NATRIURETIC PEPTIDE: B Natriuretic Peptide: 27 pg/mL (ref 0.0–100.0)

## 2021-11-14 LAB — AMMONIA: Ammonia: 11 umol/L (ref 9–35)

## 2021-11-14 LAB — TROPONIN I (HIGH SENSITIVITY): Troponin I (High Sensitivity): 8 ng/L (ref <18)

## 2021-11-14 LAB — PROTIME-INR
INR: 1.4 — ABNORMAL HIGH (ref 0.8–1.2)
Prothrombin Time: 17.4 seconds — ABNORMAL HIGH (ref 11.4–15.2)

## 2021-11-14 LAB — RESP PANEL BY RT-PCR (FLU A&B, COVID) ARPGX2
Influenza A by PCR: NEGATIVE
Influenza B by PCR: NEGATIVE
SARS Coronavirus 2 by RT PCR: NEGATIVE

## 2021-11-14 LAB — APTT: aPTT: 48 seconds — ABNORMAL HIGH (ref 24–36)

## 2021-11-14 LAB — LACTIC ACID, PLASMA: Lactic Acid, Venous: 3 mmol/L (ref 0.5–1.9)

## 2021-11-14 MED ORDER — SODIUM CHLORIDE 0.9 % IV BOLUS
1000.0000 mL | Freq: Once | INTRAVENOUS | Status: AC
  Administered 2021-11-14: 1000 mL via INTRAVENOUS

## 2021-11-14 MED ORDER — NOREPINEPHRINE 4 MG/250ML-% IV SOLN
0.0000 ug/min | INTRAVENOUS | Status: DC
  Administered 2021-11-14: 2 ug/min via INTRAVENOUS
  Administered 2021-11-15: 10 ug/min via INTRAVENOUS
  Administered 2021-11-15: 20 ug/min via INTRAVENOUS
  Administered 2021-11-15: 9 ug/min via INTRAVENOUS
  Administered 2021-11-16: 5 ug/min via INTRAVENOUS
  Administered 2021-11-16: 9 ug/min via INTRAVENOUS
  Administered 2021-11-16: 6 ug/min via INTRAVENOUS
  Filled 2021-11-14 (×8): qty 250

## 2021-11-14 MED ORDER — LEVOFLOXACIN IN D5W 500 MG/100ML IV SOLN: 500.0000 mg | INTRAVENOUS | Status: DC

## 2021-11-14 MED ORDER — LEVOFLOXACIN IN D5W 750 MG/150ML IV SOLN
750.0000 mg | Freq: Once | INTRAVENOUS | Status: AC
  Administered 2021-11-14: 750 mg via INTRAVENOUS
  Filled 2021-11-14: qty 150

## 2021-11-14 MED ORDER — NALOXONE HCL 2 MG/2ML IJ SOSY
2.0000 mg | PREFILLED_SYRINGE | INTRAMUSCULAR | Status: DC | PRN
  Administered 2021-11-14: 2 mg via INTRAVENOUS

## 2021-11-14 MED ORDER — ACETAMINOPHEN 650 MG RE SUPP
650.0000 mg | Freq: Once | RECTAL | Status: AC
  Administered 2021-11-14: 650 mg via RECTAL
  Filled 2021-11-14: qty 1

## 2021-11-14 MED ORDER — NALOXONE HCL 2 MG/2ML IJ SOSY
PREFILLED_SYRINGE | INTRAMUSCULAR | Status: AC
  Administered 2021-11-14: 2 mg via INTRAVENOUS
  Filled 2021-11-14: qty 2

## 2021-11-14 MED ORDER — NALOXONE HCL 2 MG/2ML IJ SOSY
PREFILLED_SYRINGE | INTRAMUSCULAR | Status: AC
  Filled 2021-11-14: qty 2

## 2021-11-14 NOTE — ED Notes (Signed)
Date and time results received: 11/14/21 2205 (use smartphrase ".now" to insert current time)  Test: lactic acid Critical Value: 3.0  Name of Provider Notified: Sharmon Leyden, MD

## 2021-11-14 NOTE — Progress Notes (Signed)
Patient came in EMS as unresponsive. On NRB mask. Narcan was given by RN. Pulse ox was 100%. Placed patient on 6 lpm nasal cannula. Doing well and more alert at this time.

## 2021-11-14 NOTE — ED Notes (Signed)
Graylon Good, NP called for update on Pts status. Graylon Good reports she is the [place] provider for [facility] and will notify the SNF Provider during Daytime of Pt status.

## 2021-11-14 NOTE — Progress Notes (Signed)
Pt being followed by [facility] for Sepsis protocol. 

## 2021-11-14 NOTE — ED Provider Notes (Signed)
[facility] EMERGENCY DEPARTMENT Provider Note   CSN: [id number] Arrival date & time: 11/14/21  2038     History {Add pertinent medical, surgical, social history, OB history to HPI:1} Chief Complaint  Patient presents with   Unresponsive    [name] is a 76 y.o. female.  Patient presents to the emergency department for altered mental status.  When the paramedics arrived she was unresponsive and breathing a little bit.  She has a history of COPD heart failure diabetes hypertension paraplegia.   Altered Mental Status      Home Medications Prior to Admission medications   Medication Sig Start Date End Date Taking? Authorizing Provider  acetaminophen (TYLENOL) 325 MG tablet Take 325 mg by mouth 2 (two) times daily. *May take one every 6 hours as needed for pain    [provider]  albuterol (VENTOLIN HFA) 108 (90 Base) MCG/ACT inhaler Inhale 2 puffs into the lungs every 8 (eight) hours as needed (Asthma).     [provider]  allopurinol (ZYLOPRIM) 100 MG tablet Take 100 mg by mouth daily.  10/14/14   [provider]  alum & mag hydroxide-simeth (MAALOX/MYLANTA) 200-200-20 MG/5ML suspension Take 20 mLs by mouth every 4 (four) hours as needed for indigestion or heartburn.     [provider]  AMINO ACIDS-PROTEIN HYDROLYS PO Take 30 mLs by mouth in the morning and at bedtime.    [provider]  aspirin EC 81 MG tablet Take 81 mg by mouth daily.    [provider]  aspirin-acetaminophen-caffeine (EXCEDRIN MIGRAINE) [phone] MG tablet Take 1 tablet by mouth every 8 (eight) hours as needed for headache.    [provider]  atorvastatin (LIPITOR) 10 MG tablet Take 10 mg by mouth daily.    [provider]  b complex vitamins tablet Take 1 tablet by mouth daily.    [provider]  buPROPion (WELLBUTRIN XL) 300 MG 24 hr tablet Take 300 mg by mouth daily.     [provider]  calcium  citrate-vitamin D 500-400 MG-UNIT Chew 1 tablet by mouth 2 (two) times daily.    [provider]  carvedilol (COREG) 12.5 MG tablet Take 12.5 mg by mouth 2 (two) times daily with a meal.    [provider]  Cranberry 500 MG TABS Take 1 capsule by mouth daily.    [provider]  dextromethorphan-guaiFENesin (MUCINEX DM) 30-600 MG per 12 hr tablet Take 1 tablet by mouth every 12 (twelve) hours as needed for cough.     [provider]  diazepam (VALIUM) 2 MG tablet Take 2 mg by mouth in the morning, at noon, and at bedtime.    [provider]  docusate sodium (COLACE) 100 MG capsule Take 100 mg by mouth at bedtime.    [provider]  ezetimibe (ZETIA) 10 MG tablet Take 10 mg by mouth at bedtime.     [provider]  ferrous sulfate 325 (65 FE) MG tablet Take 325 mg by mouth 2 (two) times daily with a meal.    [provider]  fluticasone (FLONASE) 50 MCG/ACT nasal spray Place 2 sprays into both nostrils daily.  09/03/14   [provider]  furosemide (LASIX) 40 MG tablet Take 40 mg by mouth daily.    [provider]  gabapentin (NEURONTIN) 300 MG capsule Take 300 mg by mouth every 8 (eight) hours.    [provider]  guaifenesin (ROBITUSSIN) 100 MG/5ML syrup Take 100  mg by mouth every 4 (four) hours as needed for cough.    [provider]  ipratropium-albuterol (DUONEB) 0.5-2.5 (3) MG/3ML SOLN Take 3 mLs by nebulization every 6 (six) hours as needed (shortness of breath).     [provider]  Javier Docker Oil 300 MG CAPS Take 1 capsule by mouth 2 (two) times daily.    [provider]  Lactobacillus (ACIDOPHILUS PO) Take 1 tablet by mouth in the morning and at bedtime.    [provider]  levothyroxine (SYNTHROID) 150 MCG tablet Take 150 mcg by mouth daily.    [provider]  loratadine (CLARITIN) 10 MG tablet Take 10 mg by mouth daily.    [provider]   magnesium oxide (MAG-OX) 400 MG tablet Take 400 mg by mouth 2 (two) times daily.    [provider]  Menthol-Zinc Oxide (CALMOSEPTINE) 0.44-20.6 % OINT Apply topically every 8 (eight) hours as needed.    [provider]  mirabegron ER (MYRBETRIQ) 25 MG TB24 tablet Take 25 mg by mouth daily.    [provider]  Multiple Vitamin (MULTIVITAMIN) tablet Take 1 tablet by mouth daily.    [provider]  omeprazole (PRILOSEC) 40 MG capsule Take 40 mg by mouth daily.    [provider]  oxycodone (OXY-IR) 5 MG capsule Take 5 mg by mouth in the morning and at bedtime.    [provider]  potassium chloride 20 MEQ TBCR Take 10 mEq by mouth daily. 04/06/16   Kathie Dike, MD  promethazine (PHENERGAN) 12.5 MG tablet Take 12.5 mg by mouth every 6 (six) hours as needed for nausea or vomiting.    [provider]  pseudoephedrine (SUDAFED) 30 MG tablet Take 30 mg by mouth every 8 (eight) hours as needed for congestion.    [provider]  rizatriptan (MAXALT) 10 MG tablet Take 1 tablet by mouth every 12 (twelve) hours as needed for migraine. 09/02/14   [provider]  sodium chloride (OCEAN) 0.65 % SOLN nasal spray Place 1 spray into both nostrils as needed for congestion.    [provider]  tiZANidine (ZANAFLEX) 2 MG tablet Take 2 mg by mouth in the morning, at noon, in the evening, and at bedtime.    [provider]  topiramate (TOPAMAX) 50 MG tablet Take 50 mg by mouth 2 (two) times daily.    [provider]  traZODone (DESYREL) 50 MG tablet Take 50 mg by mouth at bedtime.    [provider]  vitamin C (ASCORBIC ACID) 500 MG tablet Take 500 mg by mouth 2 (two) times daily.    [provider]  Vitamin D, Ergocalciferol, (DRISDOL) 1.25 MG (50000 UNIT) CAPS capsule Take 50,000 Units by mouth every 7 (seven) days.    [provider]  Wound Dressings (PROMOGRAN EX) Apply topically  daily.    [provider]      Allergies    Invanz [ertapenem], Propranolol hcl, Rocephin [ceftriaxone], and Vancomycin    Review of Systems   Review of Systems  Physical Exam Updated Vital Signs BP 94/62   Pulse 85   Temp (!) 97.3 F (36.3 C) (Bladder)   Resp 18   Ht '5\' 7"'$  (1.702 m)   Wt 112 kg   SpO2 94%   BMI 38.67 kg/m  Physical Exam  ED Results / Procedures / Treatments   Labs (all labs ordered are listed, but only abnormal results are displayed) Labs Reviewed  LACTIC ACID,  PLASMA - Abnormal; Notable for the following components:      Result Value   Lactic Acid, Venous 3.0 (*)    All other components within normal limits  COMPREHENSIVE METABOLIC PANEL - Abnormal; Notable for the following components:   Sodium 124 (*)    CO2 14 (*)    Glucose, Bld 153 (*)    BUN 70 (*)    Creatinine, Ser 1.61 (*)    Calcium 8.5 (*)    Albumin 2.8 (*)    AST 46 (*)    Alkaline Phosphatase 139 (*)    GFR, Estimated 34 (*)    All other components within normal limits  CBC WITH DIFFERENTIAL/PLATELET - Abnormal; Notable for the following components:   MCV 105.7 (*)    MCH 34.6 (*)    Neutro Abs 7.8 (*)    All other components within normal limits  PROTIME-INR - Abnormal; Notable for the following components:   Prothrombin Time 17.4 (*)    INR 1.4 (*)    All other components within normal limits  APTT - Abnormal; Notable for the following components:   aPTT 48 (*)    All other components within normal limits  BLOOD GAS, VENOUS - Abnormal; Notable for the following components:   pH, Ven 7.21 (*)    pCO2, Ven 37 (*)    Bicarbonate 14.7 (*)    Acid-base deficit 11.9 (*)    All other components within normal limits  RESP PANEL BY RT-PCR (FLU A&B, COVID) ARPGX2  CULTURE, BLOOD (ROUTINE X 2)  CULTURE, BLOOD (ROUTINE X 2)  URINE CULTURE  BRAIN NATRIURETIC PEPTIDE  AMMONIA  URINALYSIS, ROUTINE W REFLEX MICROSCOPIC  LACTIC ACID, PLASMA  TROPONIN I (HIGH SENSITIVITY)     EKG None  Radiology CT Head Wo Contrast  Result Date: 11/14/2021 CLINICAL DATA:  Neuro deficit, acute, stroke suspected Technologist notes state: Altered mental status.  Unresponsive. EXAM: CT HEAD WITHOUT CONTRAST TECHNIQUE: Contiguous axial images were obtained from the base of the skull through the vertex without intravenous contrast. RADIATION DOSE REDUCTION: This exam was performed according to the departmental dose-optimization program which includes automated exposure control, adjustment of the mA and/or kV according to patient size and/or use of iterative reconstruction technique. COMPARISON:  Head CT 03/27/2014 FINDINGS: Brain: No intracranial hemorrhage, mass effect, or midline shift. No hydrocephalus. The basilar cisterns are patent. No evidence of territorial infarct or acute ischemia. No extra-axial or intracranial fluid collection. Vascular: No hyperdense vessel or unexpected calcification. Skull: No fracture or focal lesion. Sinuses/Orbits: Paranasal sinuses and mastoid air cells are clear. The visualized orbits are unremarkable. Other: None. IMPRESSION: Negative noncontrast head CT. Electronically Signed   By: Keith Rake M.D.   On: 11/14/2021 22:57   DG Chest Port 1 View  Result Date: 11/14/2021 CLINICAL DATA:  Shortness of breath. EXAM: PORTABLE CHEST 1 VIEW COMPARISON:  Chest radiograph dated 04/03/2016. FINDINGS: No focal consolidation, pleural effusion or pneumothorax. Top-normal cardiac size. No acute osseous pathology. IMPRESSION: No active disease. Electronically Signed   By: Anner Crete M.D.   On: 11/14/2021 21:26    Procedures Procedures  {Document cardiac monitor, telemetry assessment procedure when appropriate:1}  Medications Ordered in ED Medications  naloxone [facility]) injection 2 mg (2 mg Intravenous Not Given 11/14/21 2306)  levofloxacin (LEVAQUIN) IVPB 500 mg (has no administration in time range)  norepinephrine (LEVOPHED) '4mg'$  in 276m (0.016 mg/mL)  premix infusion (15 mcg/min Intravenous Rate/Dose Change 11/14/21 2332)  levofloxacin (LEVAQUIN) IVPB 750 mg (0  mg Intravenous Stopped 11/14/21 2305)  sodium chloride 0.9 % bolus 1,000 mL (0 mLs Intravenous Stopped 11/14/21 2210)  acetaminophen (TYLENOL) suppository 650 mg (650 mg Rectal Given 11/14/21 2137)  sodium chloride 0.9 % bolus 1,000 mL (0 mLs Intravenous Stopped 11/14/21 2210)  sodium chloride 0.9 % bolus 1,000 mL (0 mLs Intravenous Stopped 11/14/21 2304)  sodium chloride 0.9 % bolus 1,000 mL (0 mLs Intravenous Stopped 11/14/21 2331)    ED Course/ Medical Decision Making/ A&P  CRITICAL CARE Performed by: Milton Ferguson Total critical care time: 55 minutes Critical care time was exclusive of separately billable procedures and treating other patients. Critical care was necessary to treat or prevent imminent or life-threatening deterioration. Critical care was time spent personally by me on the following activities: development of treatment plan with patient and/or surrogate as well as nursing, discussions with consultants, evaluation of patient's response to treatment, examination of patient, obtaining history from patient or surrogate, ordering and performing treatments and interventions, ordering and review of laboratory studies, ordering and review of radiographic studies, pulse oximetry and re-evaluation of patient's condition.                          Medical Decision Making Amount and/or Complexity of Data Reviewed Labs: ordered. Radiology: ordered. ECG/medicine tests: ordered.  Risk OTC drugs. Prescription drug management. Decision regarding hospitalization.   Patient with urosepsis.  She is cultured and started on antibiotics and admitted to medicine.  {Document critical care time when appropriate:1} {Document review of labs and clinical decision tools ie heart score, Chads2Vasc2 etc:1}  {Document your independent review of radiology images, and any outside  records:1} {Document your discussion with family members, caretakers, and with consultants:1} {Document social determinants of health affecting pt's care:1} {Document your decision making why or why not admission, treatments were needed:1} Final Clinical Impression(s) / ED Diagnoses Final diagnoses:  Sepsis due to gram-negative UTI [facility])    Rx / DC Orders ED Discharge Orders     None

## 2021-11-14 NOTE — ED Triage Notes (Signed)
EMS called out for unresponsive pt from [facility], [place]. States pt's O2 sats were 78% on rrom air. Placed on Non-reabreather, IO started, and IVF administered PTA. Pt covered in strong smelling urine and feces. Blood glucose was 249 via EMS. MD @ bedside. Narcan '2mg'$  given IV upon arrival.

## 2021-11-14 NOTE — ED Notes (Signed)
Patient transported to CT 

## 2021-11-14 NOTE — Progress Notes (Signed)
Pharmacy Antibiotic Note  [name] is a 76 y.o. female admitted on 11/14/2021 with UTI.  Pharmacy has been consulted for levaquin dosing.  Plan: Levaquin '750mg'$  IV given in ED, continue with '500mg'$  IV q24h F/U cxs and clinical progress Monitor V/S, labs  Height: '5\' 7"'$  (170.2 cm) Weight: 112 kg (246 lb 14.6 oz) IBW/kg (Calculated) : 61.6  Temp (24hrs), Avg:101.3 F (38.5 C), Min:101.3 F (38.5 C), Max:101.3 F (38.5 C)  Recent Labs  Lab 11/14/21 2053  WBC 8.9  CREATININE 1.61*    Estimated Creatinine Clearance: 40.2 mL/min (A) (by C-G formula based on SCr of 1.61 mg/dL (H)).    Allergies  Allergen Reactions   Invanz [Ertapenem] Other (See Comments)    Patient states allergy but is not certain    Propranolol Hcl    Rocephin [Ceftriaxone] Hives   Vancomycin Hives    Antimicrobials this admission: levaquin 8/20 >>   Microbiology results: 8/20 BCx: pending 8/20 UCx: pending    Thank you for allowing pharmacy to be a part of this patient's care.  Cristy Friedlander 11/14/2021 9:54 PM

## 2021-11-15 ENCOUNTER — Inpatient Hospital Stay (HOSPITAL_COMMUNITY): Payer: Medicare Other

## 2021-11-15 DIAGNOSIS — R6521 Severe sepsis with septic shock: Secondary | ICD-10-CM

## 2021-11-15 DIAGNOSIS — J9601 Acute respiratory failure with hypoxia: Secondary | ICD-10-CM | POA: Diagnosis not present

## 2021-11-15 DIAGNOSIS — N390 Urinary tract infection, site not specified: Secondary | ICD-10-CM

## 2021-11-15 DIAGNOSIS — G9341 Metabolic encephalopathy: Secondary | ICD-10-CM

## 2021-11-15 DIAGNOSIS — I10 Essential (primary) hypertension: Secondary | ICD-10-CM

## 2021-11-15 DIAGNOSIS — T83511A Infection and inflammatory reaction due to indwelling urethral catheter, initial encounter: Secondary | ICD-10-CM

## 2021-11-15 DIAGNOSIS — E039 Hypothyroidism, unspecified: Secondary | ICD-10-CM

## 2021-11-15 DIAGNOSIS — N179 Acute kidney failure, unspecified: Secondary | ICD-10-CM | POA: Diagnosis not present

## 2021-11-15 DIAGNOSIS — G9340 Encephalopathy, unspecified: Secondary | ICD-10-CM

## 2021-11-15 DIAGNOSIS — A419 Sepsis, unspecified organism: Principal | ICD-10-CM

## 2021-11-15 DIAGNOSIS — L8990 Pressure ulcer of unspecified site, unspecified stage: Secondary | ICD-10-CM | POA: Insufficient documentation

## 2021-11-15 DIAGNOSIS — I953 Hypotension of hemodialysis: Secondary | ICD-10-CM

## 2021-11-15 LAB — URINALYSIS, ROUTINE W REFLEX MICROSCOPIC
Bilirubin Urine: NEGATIVE
Glucose, UA: NEGATIVE mg/dL
Ketones, ur: NEGATIVE mg/dL
Nitrite: NEGATIVE
Protein, ur: 30 mg/dL — AB
Specific Gravity, Urine: 1.009 (ref 1.005–1.030)
pH: 5 (ref 5.0–8.0)

## 2021-11-15 LAB — CBC
HCT: 30.3 % — ABNORMAL LOW (ref 36.0–46.0)
HCT: 28.9 % — ABNORMAL LOW (ref 36.0–46.0)
Hemoglobin: 9.4 g/dL — ABNORMAL LOW (ref 12.0–15.0)
Hemoglobin: 9.1 g/dL — ABNORMAL LOW (ref 12.0–15.0)
MCH: 27.9 pg (ref 26.0–34.0)
MCH: 27.8 pg (ref 26.0–34.0)
MCHC: 31 g/dL (ref 30.0–36.0)
MCHC: 31.5 g/dL (ref 30.0–36.0)
MCV: 89.9 fL (ref 80.0–100.0)
MCV: 88.4 fL (ref 80.0–100.0)
Platelets: 320 10*3/uL (ref 150–400)
Platelets: 343 10*3/uL (ref 150–400)
RBC: 3.37 MIL/uL — ABNORMAL LOW (ref 3.87–5.11)
RBC: 3.27 MIL/uL — ABNORMAL LOW (ref 3.87–5.11)
RDW: 16.9 % — ABNORMAL HIGH (ref 11.5–15.5)
RDW: 17.1 % — ABNORMAL HIGH (ref 11.5–15.5)
WBC: 20.2 10*3/uL — ABNORMAL HIGH (ref 4.0–10.5)
WBC: 21.3 10*3/uL — ABNORMAL HIGH (ref 4.0–10.5)
nRBC: 0 % (ref 0.0–0.2)
nRBC: 0 % (ref 0.0–0.2)

## 2021-11-15 LAB — GLUCOSE, CAPILLARY
Glucose-Capillary: 226 mg/dL — ABNORMAL HIGH (ref 70–99)
Glucose-Capillary: 221 mg/dL — ABNORMAL HIGH (ref 70–99)
Glucose-Capillary: 227 mg/dL — ABNORMAL HIGH (ref 70–99)
Glucose-Capillary: 180 mg/dL — ABNORMAL HIGH (ref 70–99)

## 2021-11-15 LAB — TSH: TSH: 2.463 u[IU]/mL (ref 0.350–4.500)

## 2021-11-15 LAB — MRSA NEXT GEN BY PCR, NASAL: MRSA by PCR Next Gen: DETECTED — AB

## 2021-11-15 LAB — HEMOGLOBIN A1C
Hgb A1c MFr Bld: 6 % — ABNORMAL HIGH (ref 4.8–5.6)
Mean Plasma Glucose: 126 mg/dL

## 2021-11-15 LAB — PROTIME-INR
INR: 1.5 — ABNORMAL HIGH (ref 0.8–1.2)
Prothrombin Time: 17.9 seconds — ABNORMAL HIGH (ref 11.4–15.2)

## 2021-11-15 LAB — COMPREHENSIVE METABOLIC PANEL
ALT: 32 U/L (ref 0–44)
AST: 50 U/L — ABNORMAL HIGH (ref 15–41)
Albumin: 2.3 g/dL — ABNORMAL LOW (ref 3.5–5.0)
Alkaline Phosphatase: 130 U/L — ABNORMAL HIGH (ref 38–126)
Anion gap: 11 (ref 5–15)
BUN: 60 mg/dL — ABNORMAL HIGH (ref 8–23)
CO2: 13 mmol/L — ABNORMAL LOW (ref 22–32)
Calcium: 7.4 mg/dL — ABNORMAL LOW (ref 8.9–10.3)
Chloride: 104 mmol/L (ref 98–111)
Creatinine, Ser: 1.26 mg/dL — ABNORMAL HIGH (ref 0.44–1.00)
GFR, Estimated: 45 mL/min — ABNORMAL LOW (ref >60)
Glucose, Bld: 236 mg/dL — ABNORMAL HIGH (ref 70–99)
Potassium: 4.2 mmol/L (ref 3.5–5.1)
Sodium: 128 mmol/L — ABNORMAL LOW (ref 135–145)
Total Bilirubin: 0.7 mg/dL (ref 0.3–1.2)
Total Protein: 5.9 g/dL — ABNORMAL LOW (ref 6.5–8.1)

## 2021-11-15 LAB — LACTIC ACID, PLASMA
Lactic Acid, Venous: 2.2 mmol/L (ref 0.5–1.9)
Lactic Acid, Venous: 1.4 mmol/L (ref 0.5–1.9)

## 2021-11-15 LAB — CORTISOL-AM, BLOOD: Cortisol - AM: 51.6 ug/dL — ABNORMAL HIGH (ref 6.7–22.6)

## 2021-11-15 LAB — PHOSPHORUS: Phosphorus: 4.8 mg/dL — ABNORMAL HIGH (ref 2.5–4.6)

## 2021-11-15 LAB — MAGNESIUM: Magnesium: 3.2 mg/dL — ABNORMAL HIGH (ref 1.7–2.4)

## 2021-11-15 LAB — PROCALCITONIN: Procalcitonin: 6.76 ng/mL

## 2021-11-15 MED ORDER — HEPARIN SODIUM (PORCINE) 5000 UNIT/ML IJ SOLN
5000.0000 [IU] | Freq: Three times a day (TID) | INTRAMUSCULAR | Status: DC
  Administered 2021-11-15 – 2021-11-19 (×13): 5000 [IU] via SUBCUTANEOUS
  Filled 2021-11-15 (×13): qty 1

## 2021-11-15 MED ORDER — ONDANSETRON HCL 4 MG PO TABS: 4.0000 mg | ORAL_TABLET | Freq: Four times a day (QID) | ORAL | Status: DC | PRN

## 2021-11-15 MED ORDER — ASPIRIN 81 MG PO TBEC
81.0000 mg | DELAYED_RELEASE_TABLET | Freq: Every day | ORAL | Status: DC
  Administered 2021-11-15 – 2021-11-19 (×5): 81 mg via ORAL
  Filled 2021-11-15 (×5): qty 1

## 2021-11-15 MED ORDER — VANCOMYCIN HCL IN DEXTROSE 1-5 GM/200ML-% IV SOLN: 1000.0000 mg | Freq: Once | INTRAVENOUS | Status: DC

## 2021-11-15 MED ORDER — MIDODRINE HCL 5 MG PO TABS
10.0000 mg | ORAL_TABLET | Freq: Three times a day (TID) | ORAL | Status: DC
  Administered 2021-11-15 – 2021-11-19 (×16): 10 mg via ORAL
  Filled 2021-11-15 (×16): qty 2

## 2021-11-15 MED ORDER — SODIUM CHLORIDE 0.9 % IV BOLUS
1000.0000 mL | Freq: Once | INTRAVENOUS | Status: AC
  Administered 2021-11-15: 1000 mL via INTRAVENOUS

## 2021-11-15 MED ORDER — OXYCODONE HCL 5 MG PO TABS
5.0000 mg | ORAL_TABLET | ORAL | Status: DC | PRN
  Administered 2021-11-18 – 2021-11-19 (×2): 5 mg via ORAL
  Filled 2021-11-15 (×2): qty 1

## 2021-11-15 MED ORDER — ONDANSETRON HCL 4 MG/2ML IJ SOLN: 4.0000 mg | Freq: Four times a day (QID) | INTRAMUSCULAR | Status: DC | PRN

## 2021-11-15 MED ORDER — MIRABEGRON ER 25 MG PO TB24
25.0000 mg | ORAL_TABLET | Freq: Every day | ORAL | Status: DC
  Administered 2021-11-15 – 2021-11-19 (×5): 25 mg via ORAL
  Filled 2021-11-15 (×5): qty 1

## 2021-11-15 MED ORDER — EZETIMIBE 10 MG PO TABS
10.0000 mg | ORAL_TABLET | Freq: Every day | ORAL | Status: DC
  Administered 2021-11-15 – 2021-11-18 (×4): 10 mg via ORAL
  Filled 2021-11-15 (×4): qty 1

## 2021-11-15 MED ORDER — ACETAMINOPHEN 325 MG PO TABS
650.0000 mg | ORAL_TABLET | Freq: Four times a day (QID) | ORAL | Status: DC | PRN
  Administered 2021-11-16 – 2021-11-19 (×7): 650 mg via ORAL
  Filled 2021-11-15 (×5): qty 2

## 2021-11-15 MED ORDER — PANTOPRAZOLE SODIUM 40 MG PO TBEC
40.0000 mg | DELAYED_RELEASE_TABLET | Freq: Every day | ORAL | Status: DC
  Administered 2021-11-15 – 2021-11-19 (×5): 40 mg via ORAL
  Filled 2021-11-15 (×5): qty 1

## 2021-11-15 MED ORDER — POLYETHYLENE GLYCOL 3350 17 G PO PACK: 17.0000 g | PACK | Freq: Every day | ORAL | Status: DC | PRN

## 2021-11-15 MED ORDER — CHLORHEXIDINE GLUCONATE CLOTH 2 % EX PADS
6.0000 | MEDICATED_PAD | Freq: Every day | CUTANEOUS | Status: DC
  Administered 2021-11-15 – 2021-11-19 (×5): 6 via TOPICAL

## 2021-11-15 MED ORDER — FUROSEMIDE 40 MG PO TABS
40.0000 mg | ORAL_TABLET | Freq: Every day | ORAL | Status: DC
  Administered 2021-11-15: 40 mg via ORAL
  Filled 2021-11-15: qty 1

## 2021-11-15 MED ORDER — IPRATROPIUM-ALBUTEROL 0.5-2.5 (3) MG/3ML IN SOLN
3.0000 mL | Freq: Four times a day (QID) | RESPIRATORY_TRACT | Status: DC | PRN
Start: 2021-11-15 — End: 2021-11-20
  Administered 2021-11-17 (×2): 3 mL via RESPIRATORY_TRACT
  Filled 2021-11-15 (×2): qty 3

## 2021-11-15 MED ORDER — SODIUM CHLORIDE 0.9 % IV SOLN
1.0000 g | Freq: Once | INTRAVENOUS | Status: AC
  Administered 2021-11-15: 1 g via INTRAVENOUS
  Filled 2021-11-15: qty 20

## 2021-11-15 MED ORDER — ACETAMINOPHEN 650 MG RE SUPP: 650.0000 mg | Freq: Four times a day (QID) | RECTAL | Status: DC | PRN

## 2021-11-15 MED ORDER — SODIUM CHLORIDE 0.9 % IV SOLN
INTRAVENOUS | Status: DC
Start: 2021-11-15 — End: 2021-11-18

## 2021-11-15 MED ORDER — GABAPENTIN 300 MG PO CAPS
300.0000 mg | ORAL_CAPSULE | Freq: Two times a day (BID) | ORAL | Status: DC
  Administered 2021-11-15 – 2021-11-19 (×8): 300 mg via ORAL
  Filled 2021-11-15 (×8): qty 1

## 2021-11-15 MED ORDER — LEVOTHYROXINE SODIUM 75 MCG PO TABS
150.0000 ug | ORAL_TABLET | Freq: Every day | ORAL | Status: DC
  Administered 2021-11-15 – 2021-11-19 (×5): 150 ug via ORAL
  Filled 2021-11-15 (×5): qty 2

## 2021-11-15 MED ORDER — ATORVASTATIN CALCIUM 10 MG PO TABS
10.0000 mg | ORAL_TABLET | Freq: Every day | ORAL | Status: DC
  Administered 2021-11-15 – 2021-11-19 (×5): 10 mg via ORAL
  Filled 2021-11-15 (×5): qty 1

## 2021-11-15 MED ORDER — GABAPENTIN 300 MG PO CAPS
300.0000 mg | ORAL_CAPSULE | Freq: Three times a day (TID) | ORAL | Status: DC
  Administered 2021-11-15 (×2): 300 mg via ORAL
  Filled 2021-11-15 (×2): qty 1

## 2021-11-15 MED ORDER — HEPARIN SODIUM (PORCINE) 5000 UNIT/ML IJ SOLN: 5000.0000 [IU] | Freq: Three times a day (TID) | INTRAMUSCULAR | Status: DC

## 2021-11-15 MED ORDER — MEDIHONEY WOUND/BURN DRESSING EX PSTE
1.0000 | PASTE | Freq: Every day | CUTANEOUS | Status: DC
  Administered 2021-11-15 – 2021-11-19 (×5): 1 via TOPICAL
  Filled 2021-11-15 (×2): qty 44

## 2021-11-15 MED ORDER — OXYCODONE HCL 5 MG PO CAPS: 5.0000 mg | ORAL_CAPSULE | Freq: Four times a day (QID) | ORAL | Status: DC | PRN

## 2021-11-15 MED ORDER — SODIUM CHLORIDE 0.9 % IV SOLN
100.0000 mg | Freq: Two times a day (BID) | INTRAVENOUS | Status: DC
  Administered 2021-11-15 – 2021-11-19 (×9): 100 mg via INTRAVENOUS
  Filled 2021-11-15 (×11): qty 100

## 2021-11-15 MED ORDER — METHYLPREDNISOLONE SODIUM SUCC 125 MG IJ SOLR
125.0000 mg | Freq: Once | INTRAMUSCULAR | Status: AC
  Administered 2021-11-15: 125 mg via INTRAVENOUS
  Filled 2021-11-15: qty 2

## 2021-11-15 MED ORDER — INSULIN ASPART 100 UNIT/ML IJ SOLN
0.0000 [IU] | Freq: Three times a day (TID) | INTRAMUSCULAR | Status: DC
  Administered 2021-11-15 (×3): 5 [IU] via SUBCUTANEOUS
  Administered 2021-11-16 – 2021-11-17 (×4): 2 [IU] via SUBCUTANEOUS

## 2021-11-15 MED ORDER — SODIUM CHLORIDE 0.9 % IV SOLN
1.0000 g | Freq: Two times a day (BID) | INTRAVENOUS | Status: DC
  Administered 2021-11-15 – 2021-11-16 (×3): 1 g via INTRAVENOUS
  Filled 2021-11-15 (×3): qty 20

## 2021-11-15 MED ORDER — MUPIROCIN 2 % EX OINT
TOPICAL_OINTMENT | Freq: Two times a day (BID) | CUTANEOUS | Status: DC
  Administered 2021-11-16 – 2021-11-19 (×2): 1 via NASAL
  Filled 2021-11-15 (×2): qty 22

## 2021-11-15 NOTE — Assessment & Plan Note (Addendum)
-   Secondary to sepsis -TSH within normal limits -Mentation now improving back to baseline -Continue constant reorientation and follow clinical response. -Continue treatment for UTI.

## 2021-11-15 NOTE — Progress Notes (Signed)
MRSA + in the nares. Mupirocin ordered per protocol.

## 2021-11-15 NOTE — Assessment & Plan Note (Signed)
-   Previous urine culture has shown multiple species present and suggesting recollection. -Follow culture reports -Continue the use of meropenem -Patient is currently afebrile with improved mentation and overall responding adequately to treatment -Continue maintaining good hydration.

## 2021-11-15 NOTE — H&P (Signed)
History and Physical    Patient: [name] JXB:[id number] DOB: 07/07/1945 DOA: 11/14/2021 DOS: the patient was seen and examined on 11/15/2021 PCP: Hilbert Corrigan, MD  Patient coming from: SNF  Chief Complaint:  Chief Complaint  Patient presents with   Unresponsive   HPI: [name] is a 76 y.o. female with medical history significant of bipolar 1 disorder, COPD with asthma, diabetes mellitus type 2, GERD, hypertension, obesity, paraplegic spinal paralysis, pyelonephritis, thyroid disease, and more presents to ED with a chief complaint of unresponsiveness.  EMS was called at [facility] because patient was unresponsive.  It was reported when they found her she was hypoxic and started on nonrebreather.  She had IV fluid prior to arrival.  At arrival she got 2 mg of Narcan.  With fluid and oxygen patient did wake up, but remains hypersomnolent.  She is also confused when she wakes up, per ED provider report.  She did not wake up for me, but she did withdraw from pain.  Patient was found to be tensive and was given a 4 L bolus.  Given another bolus at admission.  Patient's blood pressures did not stabilize, so she was started on Levophed.  She was maxed out on 20 mcg of Levophed at time of admission.  Central line was requested from ED provider and is pending.  UA indicates UTI, is reported there was frank purulent drainage in her catheter.  CT of her head was negative.  Patient was started on Levaquin as she has a Rocephin allergy.  She was given Tylenol.  Blood cultures obtained.  Urine culture pending.  VBG shows normal PCO2.  Admission requested for management of sepsis secondary to UTI.  Unfortunately, patient is not waking up to provide me any history, so no further history can be obtained at this time. Review of Systems: unable to review all systems due to the inability of the patient to answer questions. Past Medical History:  Diagnosis Date   Bipolar 1 disorder ([place])     COPD with asthma ([place])    Diabetes mellitus without complication ([place])    GERD (gastroesophageal reflux disease)    Hypertension    Obesity    Paraplegic spinal paralysis ([place])    Pyelonephritis    Thyroid disease    Past Surgical History:  Procedure Laterality Date   ABDOMINAL HYSTERECTOMY     BACK SURGERY     Social History:  reports that she has quit smoking. She has never used smokeless tobacco. She reports that she does not currently use alcohol. She reports that she does not use drugs.  Allergies  Allergen Reactions   Invanz [Ertapenem] Other (See Comments)    Patient states allergy but is not certain    Propranolol Hcl    Rocephin [Ceftriaxone] Hives   Vancomycin Hives    Family History  Adopted: Yes  Problem Relation Age of Onset   Heart disease Mother     Prior to Admission medications   Medication Sig Start Date End Date Taking? Authorizing Provider  acetaminophen (TYLENOL) 325 MG tablet Take 325 mg by mouth 2 (two) times daily. *May take one every 6 hours as needed for pain    [provider]  albuterol (VENTOLIN HFA) 108 (90 Base) MCG/ACT inhaler Inhale 2 puffs into the lungs every 8 (eight) hours as needed (Asthma).     [provider]  allopurinol (ZYLOPRIM) 100 MG tablet Take 100 mg by mouth daily.  10/14/14  [provider]  alum & mag hydroxide-simeth (MAALOX/MYLANTA) 200-200-20 MG/5ML suspension Take 20 mLs by mouth every 4 (four) hours as needed for indigestion or heartburn.     [provider]  AMINO ACIDS-PROTEIN HYDROLYS PO Take 30 mLs by mouth in the morning and at bedtime.    [provider]  aspirin EC 81 MG tablet Take 81 mg by mouth daily.    [provider]  aspirin-acetaminophen-caffeine (EXCEDRIN MIGRAINE) [phone] MG tablet Take 1 tablet by mouth every 8 (eight) hours as needed for headache.    [provider]  atorvastatin (LIPITOR) 10 MG tablet Take 10 mg by mouth daily.     [provider]  b complex vitamins tablet Take 1 tablet by mouth daily.    [provider]  buPROPion (WELLBUTRIN XL) 300 MG 24 hr tablet Take 300 mg by mouth daily.     [provider]  calcium citrate-vitamin D 500-400 MG-UNIT Chew 1 tablet by mouth 2 (two) times daily.    [provider]  carvedilol (COREG) 12.5 MG tablet Take 12.5 mg by mouth 2 (two) times daily with a meal.    [provider]  Cranberry 500 MG TABS Take 1 capsule by mouth daily.    [provider]  dextromethorphan-guaiFENesin (MUCINEX DM) 30-600 MG per 12 hr tablet Take 1 tablet by mouth every 12 (twelve) hours as needed for cough.     [provider]  diazepam (VALIUM) 2 MG tablet Take 2 mg by mouth in the morning, at noon, and at bedtime.    [provider]  docusate sodium (COLACE) 100 MG capsule Take 100 mg by mouth at bedtime.    [provider]  ezetimibe (ZETIA) 10 MG tablet Take 10 mg by mouth at bedtime.     [provider]  ferrous sulfate 325 (65 FE) MG tablet Take 325 mg by mouth 2 (two) times daily with a meal.    [provider]  fluticasone (FLONASE) 50 MCG/ACT nasal spray Place 2 sprays into both nostrils daily.  09/03/14   [provider]  furosemide (LASIX) 40 MG tablet Take 40 mg by mouth daily.    [provider]  gabapentin (NEURONTIN) 300 MG capsule Take 300 mg by mouth every 8 (eight) hours.    [provider]  guaifenesin (ROBITUSSIN) 100 MG/5ML syrup Take 100 mg by mouth every 4 (four) hours as needed for cough.    [provider]  ipratropium-albuterol (DUONEB) 0.5-2.5 (3) MG/3ML SOLN Take 3 mLs by nebulization every 6 (six) hours as needed (shortness of breath).     [provider]  Javier Docker Oil 300 MG CAPS Take 1 capsule by mouth 2 (two) times daily.    [provider]  Lactobacillus (ACIDOPHILUS PO) Take 1 tablet by mouth in the morning and at bedtime.     [provider]  levothyroxine (SYNTHROID) 150 MCG tablet Take 150 mcg by mouth daily.    [provider]  loratadine (CLARITIN) 10 MG tablet Take 10 mg by mouth daily.    [provider]  magnesium oxide (MAG-OX) 400 MG tablet Take 400 mg by mouth 2 (two) times daily.    [provider]  Menthol-Zinc Oxide (CALMOSEPTINE) 0.44-20.6 % OINT Apply topically every 8 (eight) hours as needed.    [provider]  mirabegron ER (MYRBETRIQ) 25 MG TB24 tablet Take 25 mg by mouth daily.    [provider]  Multiple Vitamin (MULTIVITAMIN)  tablet Take 1 tablet by mouth daily.    [provider]  omeprazole (PRILOSEC) 40 MG capsule Take 40 mg by mouth daily.    [provider]  oxycodone (OXY-IR) 5 MG capsule Take 5 mg by mouth in the morning and at bedtime.    [provider]  potassium chloride 20 MEQ TBCR Take 10 mEq by mouth daily. 04/06/16   Kathie Dike, MD  promethazine (PHENERGAN) 12.5 MG tablet Take 12.5 mg by mouth every 6 (six) hours as needed for nausea or vomiting.    [provider]  pseudoephedrine (SUDAFED) 30 MG tablet Take 30 mg by mouth every 8 (eight) hours as needed for congestion.    [provider]  rizatriptan (MAXALT) 10 MG tablet Take 1 tablet by mouth every 12 (twelve) hours as needed for migraine. 09/02/14   [provider]  sodium chloride (OCEAN) 0.65 % SOLN nasal spray Place 1 spray into both nostrils as needed for congestion.    [provider]  tiZANidine (ZANAFLEX) 2 MG tablet Take 2 mg by mouth in the morning, at noon, in the evening, and at bedtime.    [provider]  topiramate (TOPAMAX) 50 MG tablet Take 50 mg by mouth 2 (two) times daily.    [provider]  traZODone (DESYREL) 50 MG tablet Take 50 mg by mouth at bedtime.    [provider]  vitamin C (ASCORBIC ACID) 500 MG tablet Take 500 mg by mouth 2 (two) times daily.     [provider]  Vitamin D, Ergocalciferol, (DRISDOL) 1.25 MG (50000 UNIT) CAPS capsule Take 50,000 Units by mouth every 7 (seven) days.    [provider]  Wound Dressings (PROMOGRAN EX) Apply topically daily.    [provider]    Physical Exam: Vitals:   11/15/21 0140 11/15/21 0150 11/15/21 0200 11/15/21 0215  BP: (!) 97/53 (!) 87/49 (!) 82/44 (!) 87/48  Pulse: 89 88 90 87  Resp: '16 16 13 17  '$ Temp: 97.7 F (36.5 C) 97.7 F (36.5 C) 97.7 F (36.5 C) (!) 97.3 F (36.3 C)  TempSrc: Bladder Bladder Bladder Bladder  SpO2: 94% 94% 96% 100%  Weight:      Height:       1.  General: Patient lying supine in bed,  no acute distress   2. Psychiatric: Hypersomnolent, not following commands  3. Neurologic: Hypersomnolent, not following commands, protecting airway, withdrawing from pain   4. HEENMT:  Head is atraumatic, normocephalic, pupils reactive to light, neck is supple, trachea is midline, mucous membranes are moist   5. Respiratory : Lungs are clear to auscultation bilaterally without wheezing, rhonchi, rales, no cyanosis, no increase in work of breathing or accessory muscle use   6. Cardiovascular : Heart rate normal, rhythm is regular, no rubs or gallops, no peripheral pitting edema, peripheral pulses palpated   7. Gastrointestinal:  Abdomen is soft, nondistended, nontender to palpation bowel sounds active, no masses or organomegaly palpated   8. Skin:  Skin is warm, dry and intact without rashes, acute lesions, or ulcers on limited exam   9.Musculoskeletal:  No acute deformities or trauma, no asymmetry in tone, peripheral pulses palpated, no tenderness to palpation in the extremities  Data Reviewed: In the ED Temp 97.5-101.3, heart rate 78-84, respiratory rate 15-24, blood pressure 47/26-111/97, satting normally on 4 L nasal cannula pH 7.2, PCO2 37, PO2 36 on VBG Chest x-ray shows no active disease Lactic acid initially 3.0, improving on  repeat at 2.2 No leukocytosis with a white blood cell count of 8.9, hemoglobin 13.9 Chemistry reveals a metabolic acidosis which is likely due to the lactic acidosis and AKI with a bicarb of 14 BUN 70, creatinine 1.61, baseline creatinine in our system was 0.6 COVID negative UA pending Urine culture pending Plan for UTI 4 L bolus Tylenol 650 mg Levophed started, central line requested and will be done prior to moving patient to ICU Blood culture  Assessment and Plan: Acute metabolic encephalopathy - Secondary to sepsis - CT head is negative for acute changes - History of hypothyroidism, check TSH - PCO2 37, PO2 36 - Anticipate improvement with correction of sepsis pathology - Continue to monitor  Sepsis ([place]) - Septic shock requiring pressors - Temp 101.3, respiratory rate 24, blood pressure as low as 47/26, maintaining oxygen sats on 4 L nasal cannula - Lactic acid 3.0 - AKI with an increase in creatinine from 0.6>> 1.61 - Most likely source seems to be urine, with positive UA and frank pus from the catheter - Levaquin started in the ED secondary to Rocephin allergy - Marrow started at admission per sepsis order set - Tylenol for fever - Continue prressor support, weaning off as tolerated - Blood cultures pending - Urine culture pending - Chest x-ray shows no active disease - Monitor in the ICU  Essential hypertension - Holding oral antihypertensives given patient's hypotension  AKI (acute kidney injury) (HCC) Creatinine at baseline 0.6 - Today creatinine 1.61 - Secondary to dehydration and sepsis -4 L IV fluids given in the ED - Another liter of IV fluid ordered at admission as patient is still clinically dry - Continue IV hydration throughout the night - Continue to monitor  UTI (urinary tract infection) - Previous urine culture has shown multiple species present and suggesting recollection - Continue Merrem - Urine culture pending - Continue to  monitor  Hypotension - Secondary to sepsis and dehydration -Wean off Levophed as tolerated - Requested central line from the ER provider for adequate pressor support - Hold antihypertensives and Lasix - Continue to monitor  Acute respiratory failure with hypoxia (HCC) Acute on chronic respiratory failure - Patient requiring 4 L nasal cannula - O2 sats as low as 78% when EMS picked her up - Wean from nonrebreather down to 4 L - Secondary to sepsis - Continue weaning off O2 as tolerated - Continue DuoNeb as needed - Continue to monitor  Hypothyroidism - Continue Synthroid - Check TSH  Morbid obesity ([place]) - Patient will need weight loss counseling when her mentation clears      Advance Care Planning:   Code Status: Full Code  Consults: PCCM was consulted from the ED.  If patient should require 2 pressors she will be [facility] to [facility].  Family Communication: No family at bedside  Severity of Illness: The appropriate patient status for this patient is INPATIENT. Inpatient status is judged to be reasonable and necessary in order to provide the required intensity of service to ensure the patient's safety. The patient's presenting symptoms, physical exam findings, and initial radiographic and laboratory data in the context of their chronic comorbidities is felt to place them at high risk for further clinical deterioration. Furthermore, it is not anticipated that the patient will be medically stable for discharge from the hospital within 2 midnights of admission.   * I certify that at the point of admission it is my clinical judgment that the patient will require inpatient hospital care spanning beyond 2 midnights  from the point of admission due to high intensity of service, high risk for further deterioration and high frequency of surveillance required.*  Author: Rolla Plate, DO 11/15/2021 2:31 AM  For on call review [email or web address].

## 2021-11-15 NOTE — Progress Notes (Signed)
Patient seen and examined; admitted after midnight secondary to sepsis with septic shock due to Foley catheter associated UTI.  Despite fluid resuscitation patient blood pressure remains to be low and in the requiring pressor support.  She is still on Levophed to maintain vital signs.  Mentation has improved and is overall better in comparison to description provided at time of admission.  No nausea, no vomiting, no chest pain, no shortness of breath.  Given sacrum pressure injury ulcer/wound with mild drainage doxycycline has been added to help any potential superimposed infection.  Continue empirical IV meropenem and and follow culture results.  Please refer to H&P written by Dr. Clearence Ped for further info/details on admission.  Plan: -Hold diuretics and any other nephrotoxic agents: -Adjust medication based on renal function -Continue to maintain adequate hydration -Continue current antibiotics -Patient allergy to Rocephin and vancomycin; continue empirical management with the use of doxycycline and meropenem. -Follow culture results and clinical response.  Barton Dubois MD [phone]

## 2021-11-15 NOTE — Assessment & Plan Note (Signed)
-  Continue Synthroid -TSH within normal limits. 

## 2021-11-15 NOTE — Assessment & Plan Note (Signed)
-  Septic shock requiring pressors -Patient with severe sepsis with septic shock at time of admission: Temp 101.3, respiratory rate 24, blood pressure as low as 47/26, lactic acid of 3.0; despite fluid resuscitation in the requiring pressor supports and has acute kidney injury. -Continue current IV antibiotic -Follow culture result -Continue to wean off vasopressors as tolerated -Continue to maintain adequate hydration -Follow clinical response.

## 2021-11-15 NOTE — Progress Notes (Signed)
Notified of downtrending hemoglobin from 13>> 9.  This is after more than 4 L of fluid.  No obvious signs of bleeding.  Likely related to hemodilution.  Monitor CBC every 4 hours x3.

## 2021-11-15 NOTE — Assessment & Plan Note (Signed)
Acute on chronic respiratory failure - Patient requiring 4 L nasal cannula - O2 sats as low as 78% when EMS picked her up - Wean from nonrebreather down to 4 L - Secondary to sepsis - Continue weaning off O2 as tolerated - Continue DuoNeb as needed - Continue to monitor

## 2021-11-15 NOTE — Assessment & Plan Note (Signed)
-  Body mass index is 36.84 kg/m. -Portion control and low calorie diet discussed with patient.

## 2021-11-15 NOTE — Consult Note (Addendum)
[facility] Nurse Consult Note: Reason for Consult: Consult requested for multiple wounds.  Performed remotely after review of progress notes and online camera assessment with the patient and bedside nurse, who obtained measurements and assisted with positioning.  Pt is critically ill in ICU and has multiple systemic factors which can impair healing.  She is on a low airloss mattress to reduce pressure.  Wound type: Right inner labia with partial thickness skin tear; red and moist, 1X2.5X.1cm Left buttock with partial thickness skin tear; 3X1X.1cm, red and moist Left outer thigh with an Unstageable pressure injury; 80% yellow, 20% red, mod amt tan drainage, 7X5X.2cm Right lower buttock with dark red-purple deep tissue pressure injury, 1X1cm Left inner buttock with Unstageable pressure injury, yellow and moist, 7X2cm, mod amt tan drainage Right middle buttock with Stage 4 pressure injury; bone palpable when probed with a swab, red and moist, mod amt tan drainage, 3X2X6cm  Right outer thigh with 2 areas of Unstageable pressure injuries, seperated by narrow intact bridge of skin; affected areas are 50% yellow, 50% red, 15X9cm Right upper thigh with full thickness wound, red and moist, 2X2X.2cm Pressure Injury POA: Yes Topical treatment orders provided for bedside nurses to perform to assist with removal of nonviable tissue, absorb drainage, and protect from further injury as follows:  1.Apply Medihoney to right and left inner  and outer thigh wounds and left lower buttock Q day, then cover with foam dressing.  Change foam dressings Q 3 days or PRN soiling. 2. Apply moist gauze packing to right middle buttock Q day, using swab to fill, then cover with foam dressing.  (Change foam dressing Q 3 days or PRN soiling.) 3. Foam dressings to right inner groin, left buttock, right lower buttock, right upper thigh.  Change Q 3 days or PRN soiling. Please re-consult if further assistance is needed.  Thank-you,  Julien Girt MSN, [place], [place], [place], [place]

## 2021-11-15 NOTE — Progress Notes (Signed)
Pharmacy Antibiotic Note  [name] is a 76 y.o. female admitted on 11/14/2021 with UTI.  Pharmacy has been consulted for meropenem dosing. Patient has noted allergy to rocephin (hives) and ertapenem (unknown reaction), but the provider would like to try meropenem at this time. The patient has already received one dose of levaquin. CrCl ~40 mL/min, will dose reduce  Plan: Meropenem 1G IV q12 hours  Height: '5\' 7"'$  (170.2 cm) Weight: 112 kg (246 lb 14.6 oz) IBW/kg (Calculated) : 61.6  Temp (24hrs), Avg:98 F (36.7 C), Min:97.3 F (36.3 C), Max:101.3 F (38.5 C)  Recent Labs  Lab 11/14/21 2053 11/14/21 2124  WBC 8.9  --   CREATININE 1.61*  --   LATICACIDVEN  --  3.0*    Estimated Creatinine Clearance: 40.2 mL/min (A) (by C-G formula based on SCr of 1.61 mg/dL (H)).    Allergies  Allergen Reactions   Invanz [Ertapenem] Other (See Comments)    Patient states allergy but is not certain    Propranolol Hcl    Rocephin [Ceftriaxone] Hives   Vancomycin Hives    Antimicrobials this admission: Levaquin x1 8/20 Meropenem 8/21>>  Microbiology results: pending  Thank you for allowing pharmacy to be a part of this patient's care.  Jodean Lima Shenee Wignall 11/15/2021 12:27 AM

## 2021-11-15 NOTE — Assessment & Plan Note (Signed)
-   Secondary to sepsis and dehydration -Wean off Levophed as tolerated - Requested central line from the ER provider for adequate pressor support - Hold antihypertensives and Lasix - Continue to monitor

## 2021-11-15 NOTE — ED Provider Notes (Signed)
  Physical Exam  BP 103/87   Pulse 87   Temp 98.6 F (37 C) (Bladder)   Resp 20   Ht '5\' 7"'$  (1.702 m)   Wt 112 kg   SpO2 96%   BMI 38.67 kg/m   Physical Exam  Procedures  .Central Line  Date/Time: 11/15/2021 6:00 AM  Performed by: Davonna Belling, MD Authorized by: Davonna Belling, MD   Consent:    Consent obtained:  Verbal   Consent given by:  Patient   Risks, benefits, and alternatives were discussed: yes     Risks discussed:  Arterial puncture, incorrect placement, nerve damage, pneumothorax, infection and bleeding   Alternatives discussed:  No treatment, delayed treatment and alternative treatment Universal protocol:    Patient identity confirmed:  Verbally with patient Pre-procedure details:    Indication(s): central venous access     Hand hygiene: Hand hygiene performed prior to insertion     Sterile barrier technique: All elements of maximal sterile technique followed     Skin preparation:  Chlorhexidine   Skin preparation agent: Skin preparation agent completely dried prior to procedure   Sedation:    Sedation type:  None Anesthesia:    Anesthesia method:  Local infiltration   Local anesthetic:  Lidocaine 1% w/o epi Procedure details:    Location:  L internal jugular   Site selection rationale:  Failed attempt right internal jugular.  Patient is incontinent at baseline so groin access less desirable.   Patient position:  Trendelenburg   Procedural supplies:  Triple lumen   Landmarks identified: yes     Ultrasound guidance: yes     Ultrasound guidance timing: real time     Sterile ultrasound techniques: Sterile gel and sterile probe covers were used     Number of attempts:  1   Successful placement: yes   Post-procedure details:    Post-procedure:  Dressing applied and line sutured   Assessment:  Blood return through all ports, no pneumothorax on x-ray, placement verified by x-ray and free fluid flow   Procedure completion:  Tolerated well, no immediate  complications   ED Course / MDM    Medical Decision Making Amount and/or Complexity of Data Reviewed Labs: ordered. Radiology: ordered. ECG/medicine tests: ordered.  Risk OTC drugs. Prescription drug management. Decision regarding hospitalization.          Davonna Belling, MD 11/15/21 [phone]

## 2021-11-15 NOTE — ED Provider Notes (Signed)
  Physical Exam  BP (!) 98/56   Pulse 88   Temp (!) 97.5 F (36.4 C) (Bladder)   Resp 17   Ht '5\' 7"'$  (1.702 m)   Wt 112 kg   SpO2 93%   BMI 38.67 kg/m   Physical Exam  Procedures  .Central Line  Date/Time: 11/15/2021 3:09 AM  Performed by: Davonna Belling, MD Authorized by: Davonna Belling, MD   Consent:    Consent obtained:  Verbal   Consent given by:  Patient   Risks, benefits, and alternatives were discussed: yes     Risks discussed:  Arterial puncture, incorrect placement, nerve damage, pneumothorax, infection and bleeding   Alternatives discussed:  No treatment, delayed treatment, alternative treatment and observation Universal protocol:    Patient identity confirmed:  Verbally with patient and arm band Pre-procedure details:    Indication(s): central venous access     Hand hygiene: Hand hygiene performed prior to insertion     Sterile barrier technique: All elements of maximal sterile technique followed     Skin preparation:  Chlorhexidine   Skin preparation agent: Skin preparation agent completely dried prior to procedure   Sedation:    Sedation type:  None Anesthesia:    Anesthesia method:  Local infiltration   Local anesthetic:  Lidocaine 1% w/o epi Procedure details:    Location:  R internal jugular   Patient position:  Trendelenburg   Procedural supplies:  Triple lumen   Landmarks identified: yes     Ultrasound guidance: yes     Ultrasound guidance timing: real time     Sterile ultrasound techniques: Sterile gel and sterile probe covers were used     Number of attempts:  3   Successful placement: no   Comments:     Patient initially had refused central line placement.  However with more discussion patient was willing to do it.  Anesthetize skin and then under ultrasound guidance placed needle into internal jugular vein on right.  However unable to thread wire more than a few centimeters.  Removed needle and wire.  Restock and ultrasound did show the  needle in the vein and easily pulled black blood.  Wire once again would only advance a few centimeters past the end of the needle.  Attempt aborted.   ED Course / MDM    Medical Decision Making Amount and/or Complexity of Data Reviewed Labs: ordered. Radiology: ordered. ECG/medicine tests: ordered.  Risk OTC drugs. Prescription drug management. Decision regarding hospitalization.          Davonna Belling, MD 11/15/21 [phone]

## 2021-11-15 NOTE — TOC Transition Note (Signed)
Transition of Care [facility]) - CM/SW Discharge Note   Patient Details  Name: [name] MRN: [id number] Date of Birth: 1945-04-03  Transition of Care [facility]) CM/SW Contact:  Iona Beard, [place] Phone Number: 11/15/2021, 12:53 PM   Clinical Narrative:    TOC consulted due to pt being LTC resident at [facility]. CSW spoke to [facility] in admissions for update on pts prior level of functioning. Pt is alert and oriented at baseline. Pt stays in bed and traditionally refuses to get up. Pt does some bed therapies. Some times they get pt up using a hoyer lift. Pt will only need auth to return if she needs IV antibiotics or is seen by PT. Otherwise pt will be able to return without auth at D/C. TOC to follow.     Barriers to Discharge: Continued Medical Work up   Patient Goals and CMS Choice Patient states their goals for this hospitalization and ongoing recovery are:: return to LTC CMS Medicare.gov Compare Post Acute Care list provided to:: Patient Choice offered to / list presented to : Patient  Discharge Placement                       Discharge Plan and Services In-house Referral: Clinical Social Work Discharge Planning Services: CM Consult Post Acute Care Choice: Nursing Home                               Social Determinants of Health (SDOH) Interventions     Readmission Risk Interventions    11/15/2021   12:49 PM  Readmission Risk Prevention Plan  Transportation Screening Complete  HRI or [place] Complete  Social Work Consult for [place] Planning/Counseling Complete  Palliative Care Screening Not Applicable  Medication Review [employer]) Complete

## 2021-11-15 NOTE — Assessment & Plan Note (Signed)
-   Patient with soft blood pressure and requiring vasopressors assistance in the setting of sepsis with septic shock. -Continue holding antihypertensive agents currently.

## 2021-11-15 NOTE — Assessment & Plan Note (Signed)
Creatinine at baseline 0.6 - Today creatinine 1.61 - Secondary to dehydration and sepsis -4 L IV fluids given in the ED - Another liter of IV fluid ordered at admission as patient is still clinically dry - Continue IV hydration throughout the night - Continue to monitor

## 2021-11-15 NOTE — ED Notes (Signed)
Date and time results received: 11/15/21 0120   Test: LACTIC Critical Value: 2.2  Name of Provider Notified: Zierle-Ghosh, DO

## 2021-11-15 NOTE — ED Notes (Signed)
ED Provider at bedside. 

## 2021-11-16 DIAGNOSIS — I10 Essential (primary) hypertension: Secondary | ICD-10-CM | POA: Diagnosis not present

## 2021-11-16 DIAGNOSIS — J9601 Acute respiratory failure with hypoxia: Secondary | ICD-10-CM | POA: Diagnosis not present

## 2021-11-16 DIAGNOSIS — N179 Acute kidney failure, unspecified: Secondary | ICD-10-CM | POA: Diagnosis not present

## 2021-11-16 DIAGNOSIS — E876 Hypokalemia: Secondary | ICD-10-CM

## 2021-11-16 DIAGNOSIS — T83511D Infection and inflammatory reaction due to indwelling urethral catheter, subsequent encounter: Secondary | ICD-10-CM

## 2021-11-16 DIAGNOSIS — G9341 Metabolic encephalopathy: Secondary | ICD-10-CM | POA: Diagnosis not present

## 2021-11-16 LAB — CBC
HCT: 28 % — ABNORMAL LOW (ref 36.0–46.0)
Hemoglobin: 9.2 g/dL — ABNORMAL LOW (ref 12.0–15.0)
MCH: 28 pg (ref 26.0–34.0)
MCHC: 32.9 g/dL (ref 30.0–36.0)
MCV: 85.1 fL (ref 80.0–100.0)
Platelets: 372 10*3/uL (ref 150–400)
RBC: 3.29 MIL/uL — ABNORMAL LOW (ref 3.87–5.11)
RDW: 16.8 % — ABNORMAL HIGH (ref 11.5–15.5)
WBC: 24.5 10*3/uL — ABNORMAL HIGH (ref 4.0–10.5)
nRBC: 0 % (ref 0.0–0.2)

## 2021-11-16 LAB — CBC WITH DIFFERENTIAL/PLATELET
Abs Immature Granulocytes: 0.14 10*3/uL — ABNORMAL HIGH (ref 0.00–0.07)
Basophils Absolute: 0 10*3/uL (ref 0.0–0.1)
Basophils Relative: 0 %
Eosinophils Absolute: 0 10*3/uL (ref 0.0–0.5)
Eosinophils Relative: 0 %
HCT: 30.1 % — ABNORMAL LOW (ref 36.0–46.0)
Hemoglobin: 9.2 g/dL — ABNORMAL LOW (ref 12.0–15.0)
Immature Granulocytes: 1 %
Lymphocytes Relative: 3 %
Lymphs Abs: 0.7 10*3/uL (ref 0.7–4.0)
MCH: 27.5 pg (ref 26.0–34.0)
MCHC: 30.6 g/dL (ref 30.0–36.0)
MCV: 89.9 fL (ref 80.0–100.0)
Monocytes Absolute: 1.3 10*3/uL — ABNORMAL HIGH (ref 0.1–1.0)
Monocytes Relative: 6 %
Neutro Abs: 21.5 10*3/uL — ABNORMAL HIGH (ref 1.7–7.7)
Neutrophils Relative %: 90 %
Platelets: 369 10*3/uL (ref 150–400)
RBC: 3.35 MIL/uL — ABNORMAL LOW (ref 3.87–5.11)
RDW: 17.1 % — ABNORMAL HIGH (ref 11.5–15.5)
WBC: 23.8 10*3/uL — ABNORMAL HIGH (ref 4.0–10.5)
nRBC: 0 % (ref 0.0–0.2)

## 2021-11-16 LAB — GLUCOSE, CAPILLARY
Glucose-Capillary: 138 mg/dL — ABNORMAL HIGH (ref 70–99)
Glucose-Capillary: 139 mg/dL — ABNORMAL HIGH (ref 70–99)
Glucose-Capillary: 147 mg/dL — ABNORMAL HIGH (ref 70–99)
Glucose-Capillary: 149 mg/dL — ABNORMAL HIGH (ref 70–99)
Glucose-Capillary: 150 mg/dL — ABNORMAL HIGH (ref 70–99)
Glucose-Capillary: 158 mg/dL — ABNORMAL HIGH (ref 70–99)
Glucose-Capillary: 166 mg/dL — ABNORMAL HIGH (ref 70–99)

## 2021-11-16 LAB — BLOOD CULTURE ID PANEL (REFLEXED) - BCID2

## 2021-11-16 LAB — URINE CULTURE: Culture: <10000 — AB

## 2021-11-16 LAB — BASIC METABOLIC PANEL
Anion gap: 6 (ref 5–15)
BUN: 60 mg/dL — ABNORMAL HIGH (ref 8–23)
CO2: 15 mmol/L — ABNORMAL LOW (ref 22–32)
Calcium: 7.1 mg/dL — ABNORMAL LOW (ref 8.9–10.3)
Chloride: 109 mmol/L (ref 98–111)
Creatinine, Ser: 1.02 mg/dL — ABNORMAL HIGH (ref 0.44–1.00)
GFR, Estimated: 58 mL/min — ABNORMAL LOW (ref >60)
Glucose, Bld: 158 mg/dL — ABNORMAL HIGH (ref 70–99)
Potassium: 2.7 mmol/L — CL (ref 3.5–5.1)
Sodium: 130 mmol/L — ABNORMAL LOW (ref 135–145)

## 2021-11-16 MED ORDER — POTASSIUM CHLORIDE 10 MEQ/100ML IV SOLN
10.0000 meq | INTRAVENOUS | Status: AC
  Administered 2021-11-16 (×2): 10 meq via INTRAVENOUS
  Filled 2021-11-16: qty 100

## 2021-11-16 MED ORDER — POTASSIUM CHLORIDE CRYS ER 20 MEQ PO TBCR
40.0000 meq | EXTENDED_RELEASE_TABLET | ORAL | Status: AC
  Administered 2021-11-16 (×3): 40 meq via ORAL
  Filled 2021-11-16 (×3): qty 2

## 2021-11-16 MED ORDER — SODIUM CHLORIDE 0.9 % IV SOLN
1.0000 g | Freq: Three times a day (TID) | INTRAVENOUS | Status: DC
Start: 2021-11-16 — End: 2021-11-19
  Administered 2021-11-16 – 2021-11-19 (×9): 1 g via INTRAVENOUS
  Filled 2021-11-16 (×9): qty 20

## 2021-11-16 NOTE — Progress Notes (Signed)
Progress Note   Patient: [name] BPZ:[id number] DOB: 04-10-1945 DOA: 11/14/2021     2 DOS: the patient was seen and examined on 11/16/2021   Brief hospital admission course: [name] is a 76 y.o. female with medical history significant of bipolar 1 disorder, COPD with asthma, diabetes mellitus type 2, GERD, hypertension, obesity, paraplegic spinal paralysis, pyelonephritis, thyroid disease, and more presents to ED with a chief complaint of unresponsiveness.  EMS was called at [facility] because patient was unresponsive.  It was reported when they found her she was hypoxic and started on nonrebreather.  She had IV fluid prior to arrival.  At arrival she got 2 mg of Narcan.  With fluid and oxygen patient did wake up, but remains hypersomnolent.  She is also confused when she wakes up, per ED provider report.  She did not wake up for me, but she did withdraw from pain.  Patient was found to be tensive and was given a 4 L bolus.  Given another bolus at admission.  Patient's blood pressures did not stabilize, so she was started on Levophed.  She was maxed out on 20 mcg of Levophed at time of admission.  Central line was requested from ED provider and is pending.  UA indicates UTI, is reported there was frank purulent drainage in her catheter.  CT of her head was negative.  Patient was started on Levaquin as she has a Rocephin allergy.  She was given Tylenol.  Blood cultures obtained.  Urine culture pending.  VBG shows normal PCO2.  Admission requested for management of sepsis secondary to UTI.  Unfortunately, patient is not waking up to provide me any history, so no further history can be obtained at this time.  Assessment and Plan: Pressure injury of skin - Continue to follow wound care service recommendation -Continue constant repositioning and preventive measures -Doxycycline added in case there is component of superimposed infection.  Acute metabolic encephalopathy - Secondary to  sepsis -TSH within normal limits -Mentation now improving back to baseline -Continue constant reorientation and follow clinical response. -Continue treatment for UTI.  Sepsis ([place]) -Septic shock requiring pressors -Patient with severe sepsis with septic shock at time of admission: Temp 101.3, respiratory rate 24, blood pressure as low as 47/26, lactic acid of 3.0; despite fluid resuscitation in the requiring pressor supports and has acute kidney injury. -Continue current IV antibiotic -Follow culture result -Continue to wean off vasopressors as tolerated -Continue to maintain adequate hydration -Follow clinical response.    Essential hypertension - Patient with soft blood pressure and requiring vasopressors assistance in the setting of sepsis with septic shock. -Continue holding antihypertensive agents currently.   AKI (acute kidney injury) ([place]) -Creatinine at baseline 0.6 -Creatinine at time of admission 1.61 -Secondary to sepsis, septic shock and UTI. -Continue to maintain adequate hydration -Continue to follow renal function trend -Minimize nephrotoxic agents and the use of contrast.     UTI (urinary tract infection) - Previous urine culture has shown multiple species present and suggesting recollection. -Follow culture reports -Continue the use of meropenem -Patient is currently afebrile with improved mentation and overall responding adequately to treatment -Continue maintaining good hydration.  Hypotension - Secondary to sepsis and dehydration -Continue to wean off Levophed as tolerated - Continue holding antihypertensive agents/diuretics -Maintain adequate hydration -Continue treatment with IV antibiotics.  Acute respiratory failure with hypoxia ([facility]) -Acute on chronic respiratory failure - Most likely secondary to sepsis -Currently down to patient's baseline oxygen supplementation and no  longer requiring BiPAP.  -At time of arrival to facility EMS found patient  with oxygen saturation in the 78%.   -Continue as needed bronchodilator. -Flutter valve/incentive spirometer recommended.  Hypothyroidism -Continue Synthroid - TSH within normal limits.  Morbid obesity ([place]) -Body mass index is 36.84 kg/m. -Portion control and low calorie diet discussed with patient.  Hypokalemia: -Potassium 2.7 -Check magnesium level -Provide electrolyte repletion and follow trend.   Active Pressure Injury/Wound(s)     Pressure Ulcer  Duration          Pressure Injury 11/15/21 Buttocks Left;Lower Unstageable - Full thickness tissue loss in which the base of the injury is covered by slough (yellow, tan, gray, green or brown) and/or eschar (tan, brown or black) in the wound bed. 1 day   Pressure Injury 11/15/21 Buttocks Right;Lower Deep Tissue Pressure Injury - Purple or maroon localized area of discolored intact skin or blood-filled blister due to damage of underlying soft tissue from pressure and/or shear. 1 day   Pressure Injury 11/15/21 Buttocks Right;Mid Stage 4 - Full thickness tissue loss with exposed bone, tendon or muscle. 1 day   Pressure Injury 11/15/21 Thigh Right Unstageable - Full thickness tissue loss in which the base of the injury is covered by slough (yellow, tan, gray, green or brown) and/or eschar (tan, brown or black) in the wound bed. 1 day   Pressure Injury 11/15/21 Thigh Left Unstageable - Full thickness tissue loss in which the base of the injury is covered by slough (yellow, tan, gray, green or brown) and/or eschar (tan, brown or black) in the wound bed. <1 day          Subjective:  Afebrile, no chest pain, no nausea or vomiting.  Mentation improved and pretty much back to baseline.  Still requiring pressor support.  Physical Exam: Vitals:   11/16/21 0300 11/16/21 0314 11/16/21 0800 11/16/21 0900  BP: (!) 135/49  127/70   Pulse: 78  77   Resp: 16  (!) 23   Temp:  97.8 F (36.6 C)  97.6 F (36.4 C)  TempSrc:  Oral  Oral  SpO2:  100%  100%   Weight:  109.9 kg    Height:       General exam: Alert, awake, oriented x 3, no chest pain, no nausea or vomiting.  Still requiring Levophed. Respiratory system: Clear to auscultation. Respiratory effort normal.  Good saturation on chronic supplementation.  No longer requiring BiPAP. Cardiovascular system:RRR.  Rubs or gallops; no JVD. Gastrointestinal system: Abdomen is nondistended, soft and nontender. No organomegaly or masses felt. Normal bowel sounds heard. Central nervous system: No new focal neurological deficits. Extremities: No cyanosis or clubbing. Skin: No petechiae.  Patient with multiple unstageable/stage IV pressure injury wounds affecting her buttocks and thighs bilaterally.  Present at time of admission.  Minimal discharge and yellow slough appreciated on exam. Psychiatry: Judgement and insight appear normal. Mood & affect appropriate.   Data Reviewed: Basic metabolic panel: Sodium 951, potassium 2.7, bicarb 15, BUN 60, creatinine 1.02 CBC: WBCs 24.5, hemoglobin 9.2, platelet count 372 K.  Family Communication: No family at bedside  Disposition: Status is: Inpatient Remains inpatient appropriate because: Requiring IV antibiotics for sepsis due to UTI with presence of septic shock.  Currently still requiring pressor support.    Planned Discharge Destination: Skilled nursing facility long-term resident.   Author: Barton Dubois, MD 11/16/2021 10:56 AM  For on call review [email or web address].

## 2021-11-16 NOTE — Assessment & Plan Note (Signed)
-   Continue to follow wound care service recommendation -Continue constant repositioning and preventive measures -Doxycycline added in case there is component of superimposed infection.

## 2021-11-16 NOTE — Progress Notes (Signed)
PHARMACY - PHYSICIAN COMMUNICATION CRITICAL VALUE ALERT - BLOOD CULTURE IDENTIFICATION (BCID)  [name] is an 76 y.o. female who presented to [facility] on 11/14/2021 with a chief complaint of sepsis   Assessment:  1 out of 2 bottles in 1 set gram + cocci no resistance detected. Second set clear, no growth to date x 2 days (include suspected source if known)  Name of physician (or Provider) Contacted: Dr Dyann Kief  Current antibiotics: Doxy + merrem  Changes to prescribed antibiotics recommended:  Patient on appropriate abx for wound infection  Results for orders placed or performed during the hospital encounter of 11/14/21  Blood Culture ID Panel (Reflexed) (Collected: 11/14/2021  9:27 PM)  Result Value Ref Range   Enterococcus faecalis NOT DETECTED NOT DETECTED   Enterococcus Faecium NOT DETECTED NOT DETECTED   Listeria monocytogenes NOT DETECTED NOT DETECTED   Staphylococcus species DETECTED (A) NOT DETECTED   Staphylococcus aureus (BCID) NOT DETECTED NOT DETECTED   Staphylococcus epidermidis NOT DETECTED NOT DETECTED   Staphylococcus lugdunensis NOT DETECTED NOT DETECTED   Streptococcus species NOT DETECTED NOT DETECTED   Streptococcus agalactiae NOT DETECTED NOT DETECTED   Streptococcus pneumoniae NOT DETECTED NOT DETECTED   Streptococcus pyogenes NOT DETECTED NOT DETECTED   A.calcoaceticus-baumannii NOT DETECTED NOT DETECTED   Bacteroides fragilis NOT DETECTED NOT DETECTED   Enterobacterales NOT DETECTED NOT DETECTED   Enterobacter cloacae complex NOT DETECTED NOT DETECTED   Escherichia coli NOT DETECTED NOT DETECTED   Klebsiella aerogenes NOT DETECTED NOT DETECTED   Klebsiella oxytoca NOT DETECTED NOT DETECTED   Klebsiella pneumoniae NOT DETECTED NOT DETECTED   Proteus species NOT DETECTED NOT DETECTED   Salmonella species NOT DETECTED NOT DETECTED   Serratia marcescens NOT DETECTED NOT DETECTED   Haemophilus influenzae NOT DETECTED NOT DETECTED   Neisseria  meningitidis NOT DETECTED NOT DETECTED   Pseudomonas aeruginosa NOT DETECTED NOT DETECTED   Stenotrophomonas maltophilia NOT DETECTED NOT DETECTED   Candida albicans NOT DETECTED NOT DETECTED   Candida auris NOT DETECTED NOT DETECTED   Candida glabrata NOT DETECTED NOT DETECTED   Candida krusei NOT DETECTED NOT DETECTED   Candida parapsilosis NOT DETECTED NOT DETECTED   Candida tropicalis NOT DETECTED NOT DETECTED   Cryptococcus neoformans/gattii NOT DETECTED NOT DETECTED    [name] 11/16/2021  9:52 AM

## 2021-11-17 ENCOUNTER — Inpatient Hospital Stay (HOSPITAL_COMMUNITY): Payer: Medicare Other

## 2021-11-17 ENCOUNTER — Other Ambulatory Visit (HOSPITAL_COMMUNITY): Payer: Self-pay | Admitting: *Deleted

## 2021-11-17 DIAGNOSIS — R7881 Bacteremia: Secondary | ICD-10-CM

## 2021-11-17 DIAGNOSIS — G9341 Metabolic encephalopathy: Secondary | ICD-10-CM | POA: Diagnosis not present

## 2021-11-17 LAB — BASIC METABOLIC PANEL
Anion gap: 2 — ABNORMAL LOW (ref 5–15)
BUN: 45 mg/dL — ABNORMAL HIGH (ref 8–23)
CO2: 16 mmol/L — ABNORMAL LOW (ref 22–32)
Calcium: 7.2 mg/dL — ABNORMAL LOW (ref 8.9–10.3)
Chloride: 117 mmol/L — ABNORMAL HIGH (ref 98–111)
Creatinine, Ser: 0.72 mg/dL (ref 0.44–1.00)
GFR, Estimated: >60 mL/min (ref >60)
Glucose, Bld: 123 mg/dL — ABNORMAL HIGH (ref 70–99)
Potassium: 4.8 mmol/L (ref 3.5–5.1)
Sodium: 135 mmol/L (ref 135–145)

## 2021-11-17 LAB — ECHOCARDIOGRAM COMPLETE
AR max vel: 1.15 cm2
AV Area VTI: 1.18 cm2
AV Area mean vel: 1.18 cm2
AV Mean grad: 10 mmHg
AV Peak grad: 18.7 mmHg
Ao pk vel: 2.16 m/s
Area-P 1/2: 3.6 cm2
Height: 68 in
S' Lateral: 3.45 cm
Weight: 3964.75 oz

## 2021-11-17 LAB — GLUCOSE, CAPILLARY
Glucose-Capillary: 117 mg/dL — ABNORMAL HIGH (ref 70–99)
Glucose-Capillary: 101 mg/dL — ABNORMAL HIGH (ref 70–99)
Glucose-Capillary: 140 mg/dL — ABNORMAL HIGH (ref 70–99)
Glucose-Capillary: 98 mg/dL (ref 70–99)
Glucose-Capillary: 125 mg/dL — ABNORMAL HIGH (ref 70–99)
Glucose-Capillary: 97 mg/dL (ref 70–99)

## 2021-11-17 LAB — MAGNESIUM: Magnesium: 2.3 mg/dL (ref 1.7–2.4)

## 2021-11-17 MED ORDER — ALUM & MAG HYDROXIDE-SIMETH 200-200-20 MG/5ML PO SUSP
15.0000 mL | Freq: Four times a day (QID) | ORAL | Status: DC | PRN
  Administered 2021-11-18 – 2021-11-19 (×5): 15 mL via ORAL
  Filled 2021-11-17 (×5): qty 30

## 2021-11-17 NOTE — Progress Notes (Signed)
PROGRESS NOTE  [name] QQP:[id number] DOB: November 09, 1945 DOA: 11/14/2021 PCP: Hilbert Corrigan, MD   LOS: 3 days   Brief Narrative / Interim history: 76 year old female with COPD, asthma, DM 2, paraplegic spinal paralysis, recurrent UTIs, hypertension, living for the past several years in an SNF was brought to the hospital due to being unresponsive.  Patient tells me that over the last couple of weeks she has been feeling weak, poor appetite, and noted that her blood pressure was on the soft side.  She was found to have sepsis due to presumed UTI in the setting of a chronic Foley, and was admitted to the ICU since she is requiring pressors.  Pertinent data: Chest x-ray 8/20-no active disease Head CT 8/20-negative Chest x-ray 8/21-no acute cardiopulmonary disease, cardiomegaly Blood cultures 8/20-1 out of 4 bottles Staphylococcus capitis Blood culture reflex -staph species Urine cultures 8/21-insignificant growth  Subjective / 24h Interval events: She is feeling a little bit better this morning.  No nausea or vomiting.  No chest pain.  Assesement and Plan: Active Problems:   Morbid obesity ([place])   Hypothyroidism   Acute respiratory failure with hypoxia (HCC)   Hypotension   UTI (urinary tract infection)   AKI (acute kidney injury) ([place])   Essential hypertension   Sepsis ([place])   Acute metabolic encephalopathy   Pressure injury of skin   Principal problem Septic shock-source not entirely clear.  She had SIRS criteria with temp of one 1.3, hypotension requiring pressors, tachypnea.  Urinalysis showed pyuria no bacteria was seen.  Urine cultures without insignificant growth.  She does have 1/4 blood cultures positive for Staph capitis which is likely contaminant.  Continue to monitor blood cultures, keep on antibiotics and wean off pressors as tolerated.  Active problems Acute metabolic encephalopathy-resolved with antibiotics and pressors, back to baseline  Essential  hypertension-hold home medications while on pressors  Acute kidney injury-due to septic shock, creatinine has normalized this morning  Acute on chronic hypoxic respiratory failure-wean off oxygen as tolerated  Hypothyroidism-continue Synthroid, TSH normal at 2.4   Morbid obesity (HCC) -BMI 37, she would benefit from weight loss   Hyperlipidemia-continue Zetia and atorvastatin  Chronic Foley catheter-exchanged in the ER 2 days ago  Multiple pressure ulcers, POA -wound care consult.  None of the wounds look with active infection             Scheduled Meds:  aspirin EC  81 mg Oral Daily   atorvastatin  10 mg Oral Daily   Chlorhexidine Gluconate Cloth  6 each Topical Daily   ezetimibe  10 mg Oral QHS   gabapentin  300 mg Oral BID   heparin  5,000 Units Subcutaneous Q8H   insulin aspart  0-15 Units Subcutaneous TID WC   leptospermum manuka honey  1 Application Topical Daily   levothyroxine  150 mcg Oral Daily   midodrine  10 mg Oral TID WC   mirabegron ER  25 mg Oral Daily   mupirocin ointment   Nasal BID   pantoprazole  40 mg Oral Daily   Continuous Infusions:  sodium chloride 125 mL/hr at 11/17/21 1307   doxycycline (VIBRAMYCIN) IV 100 mg (11/17/21 1156)   meropenem (MERREM) IV Stopped (11/17/21 0810)   norepinephrine (LEVOPHED) Adult infusion 2 mcg/min (11/17/21 1000)   PRN Meds:.acetaminophen **OR** acetaminophen, ipratropium-albuterol, naLOXone (NARCAN)  injection, ondansetron **OR** ondansetron (ZOFRAN) IV, oxyCODONE, polyethylene glycol  Diet Orders (From admission, onward)     Start     Ordered  11/15/21 0007  Diet heart healthy/carb modified Room service appropriate? Yes; Fluid consistency: Thin  Diet effective now       Question Answer Comment  Diet-HS Snack? Nothing   Room service appropriate? Yes   Fluid consistency: Thin      11/15/21 0007            DVT prophylaxis: heparin injection 5,000 Units Start: 11/15/21 0600 SCDs Start: 11/15/21  0007   Lab Results  Component Value Date   PLT 372 11/16/2021      Code Status: Full Code  Family Communication: No family at bedside  Status is: Inpatient Remains inpatient appropriate because: Still on pressors   Level of care: ICU  Consultants:  None  Objective: Vitals:   11/17/21 0730 11/17/21 0800 11/17/21 0900 11/17/21 1121  BP:  (!) 119/56 (!) 113/42   Pulse:  72 76   Resp:  17 19   Temp: 97.6 F (36.4 C)   98.3 F (36.8 C)  TempSrc: Oral   Oral  SpO2:  93% 100%   Weight:      Height:        Intake/Output Summary (Last 24 hours) at 11/17/2021 1311 Last data filed at 11/17/2021 1000 Gross per 24 hour  Intake 6137.31 ml  Output 2120 ml  Net 4017.31 ml   Wt Readings from Last 3 Encounters:  11/17/21 112.4 kg  06/04/20 111.6 kg  06/10/16 120.2 kg    Examination:  Constitutional: NAD Eyes: no scleral icterus ENMT: Mucous membranes are moist.  Neck: normal, supple Respiratory: clear to auscultation bilaterally, no wheezing, no crackles. Normal respiratory effort. No accessory muscle use.  Cardiovascular: Regular rate and rhythm, no murmurs / rubs / gallops. No LE edema.  Abdomen: non distended, no tenderness. Bowel sounds positive.  Musculoskeletal: no clubbing / cyanosis.  Skin: no rashes, wounds as shown above Neurologic: non focal   Data Reviewed: I have independently reviewed following labs and imaging studies   CBC Recent Labs  Lab 11/14/21 2053 11/15/21 0352 11/15/21 0718 11/15/21 1033 11/16/21 0447  WBC 8.9 23.8* 20.2* 21.3* 24.5*  HGB 13.9 9.2* 9.4* 9.1* 9.2*  HCT 42.5 30.1* 30.3* 28.9* 28.0*  PLT 353 369 320 343 372  MCV 105.7* 89.9 89.9 88.4 85.1  MCH 34.6* 27.5 27.9 27.8 28.0  MCHC 32.7 30.6 31.0 31.5 32.9  RDW 13.5 17.1* 16.9* 17.1* 16.8*  LYMPHSABS 0.8 0.7  --   --   --   MONOABS 0.2 1.3*  --   --   --   EOSABS 0.1 0.0  --   --   --   BASOSABS 0.1 0.0  --   --   --     Recent Labs  Lab 11/14/21 2048 11/14/21 2053  11/14/21 2124 11/14/21 2132 11/15/21 0031 11/15/21 0352 11/15/21 0353 11/16/21 0447 11/17/21 0331  NA  --  124*  --   --   --  128*  --  130* 135  K  --  4.9  --   --   --  4.2  --  2.7* 4.8  CL  --  98  --   --   --  104  --  109 117*  CO2  --  14*  --   --   --  13*  --  15* 16*  GLUCOSE  --  153*  --   --   --  236*  --  158* 123*  BUN  --  70*  --   --   --  60*  --  60* 45*  CREATININE  --  1.61*  --   --   --  1.26*  --  1.02* 0.72  CALCIUM  --  8.5*  --   --   --  7.4*  --  7.1* 7.2*  AST  --  46*  --   --   --  50*  --   --   --   ALT  --  29  --   --   --  32  --   --   --   ALKPHOS  --  139*  --   --   --  130*  --   --   --   BILITOT  --  1.0  --   --   --  0.7  --   --   --   ALBUMIN  --  2.8*  --   --   --  2.3*  --   --   --   MG  --   --   --   --   --  3.2*  --   --  2.3  PROCALCITON  --   --   --   --   --  6.76  --   --   --   LATICACIDVEN  --   --  3.0*  --  2.2* 1.4  --   --   --   INR  --  1.4*  --   --   --  1.5*  --   --   --   TSH  --   --   --   --   --   --  2.463  --   --   HGBA1C  --   --   --   --   --   --  6.0*  --   --   AMMONIA  --   --   --  11  --   --   --   --   --   BNP 27.0  --   --   --   --   --   --   --   --     ------------------------------------------------------------------------------------------------------------------ No results for input(s): "CHOL", "HDL", "LDLCALC", "TRIG", "CHOLHDL", "LDLDIRECT" in the last 72 hours.  Lab Results  Component Value Date   HGBA1C 6.0 (H) 11/15/2021   ------------------------------------------------------------------------------------------------------------------ Recent Labs    11/15/21 0353  TSH 2.463    Cardiac Enzymes No results for input(s): "CKMB", "TROPONINI", "MYOGLOBIN" in the last 168 hours.  Invalid input(s): "CK" ------------------------------------------------------------------------------------------------------------------    Component Value Date/Time   BNP 27.0  11/14/2021 2048    CBG: Recent Labs  Lab 11/16/21 1916 11/16/21 2311 11/17/21 0309 11/17/21 0742 11/17/21 1120  GLUCAP 149* 138* 117* 101* 140*    Recent Results (from the past 240 hour(s))  Blood Culture (routine x 2)     Status: None (Preliminary result)   Collection Time: 11/14/21  9:25 PM   Specimen: BLOOD LEFT HAND  Result Value Ref Range Status   Specimen Description BLOOD LEFT HAND  Final   Special Requests   Final    BOTTLES DRAWN AEROBIC ONLY Blood Culture adequate volume   Culture   Final    NO GROWTH 3 DAYS Performed at [facility], [address], [place], [place] [address]    Report Status PENDING  Incomplete  Blood Culture (routine x 2)     Status:  Abnormal (Preliminary result)   Collection Time: 11/14/21  9:27 PM   Specimen: Left Antecubital; Blood  Result Value Ref Range Status   Specimen Description   Final    LEFT ANTECUBITAL Performed at [facility], [address], [place], [place] [address]    Special Requests   Final    BOTTLES DRAWN AEROBIC ONLY Blood Culture adequate volume Performed at [facility], [address], [place], [place] [address]    Culture  Setup Time   Final    GRAM POSITIVE COCCI AEROBIC BOTTLE Gram Stain Report Called to,Read Back By and Verified With: MIZE,J'@[id number]'$  BY MATTHEWS, B 8.22.2023 CRITICAL RESULT CALLED TO, READ BACK BY AND VERIFIED WITH: PHARMD G.COFFEE AT 0950 ON 11/16/2021 BY T.SAAD.    Culture (A)  Final    STAPHYLOCOCCUS CAPITIS THE SIGNIFICANCE OF ISOLATING THIS ORGANISM FROM A SINGLE SET OF BLOOD CULTURES WHEN MULTIPLE SETS ARE DRAWN IS UNCERTAIN. PLEASE NOTIFY THE MICROBIOLOGY DEPARTMENT WITHIN ONE WEEK IF SPECIATION AND SENSITIVITIES ARE REQUIRED. Performed at [facility], [place] [address], [place], [place] [address]    Report Status PENDING  Incomplete  Blood Culture ID Panel (Reflexed)     Status: Abnormal   Collection Time: 11/14/21  9:27 PM  Result Value Ref Range Status   Enterococcus faecalis NOT  DETECTED NOT DETECTED Final   Enterococcus Faecium NOT DETECTED NOT DETECTED Final   Listeria monocytogenes NOT DETECTED NOT DETECTED Final   Staphylococcus species DETECTED (A) NOT DETECTED Final    Comment: CRITICAL RESULT CALLED TO, READ BACK BY AND VERIFIED WITH: PHARMD G.COFFEE AT 0950 ON 11/16/2021 BY T.SAAD.    Staphylococcus aureus (BCID) NOT DETECTED NOT DETECTED Final   Staphylococcus epidermidis NOT DETECTED NOT DETECTED Final   Staphylococcus lugdunensis NOT DETECTED NOT DETECTED Final   Streptococcus species NOT DETECTED NOT DETECTED Final   Streptococcus agalactiae NOT DETECTED NOT DETECTED Final   Streptococcus pneumoniae NOT DETECTED NOT DETECTED Final   Streptococcus pyogenes NOT DETECTED NOT DETECTED Final   A.calcoaceticus-baumannii NOT DETECTED NOT DETECTED Final   Bacteroides fragilis NOT DETECTED NOT DETECTED Final   Enterobacterales NOT DETECTED NOT DETECTED Final   Enterobacter cloacae complex NOT DETECTED NOT DETECTED Final   Escherichia coli NOT DETECTED NOT DETECTED Final   Klebsiella aerogenes NOT DETECTED NOT DETECTED Final   Klebsiella oxytoca NOT DETECTED NOT DETECTED Final   Klebsiella pneumoniae NOT DETECTED NOT DETECTED Final   Proteus species NOT DETECTED NOT DETECTED Final   Salmonella species NOT DETECTED NOT DETECTED Final   Serratia marcescens NOT DETECTED NOT DETECTED Final   Haemophilus influenzae NOT DETECTED NOT DETECTED Final   Neisseria meningitidis NOT DETECTED NOT DETECTED Final   Pseudomonas aeruginosa NOT DETECTED NOT DETECTED Final   Stenotrophomonas maltophilia NOT DETECTED NOT DETECTED Final   Candida albicans NOT DETECTED NOT DETECTED Final   Candida auris NOT DETECTED NOT DETECTED Final   Candida glabrata NOT DETECTED NOT DETECTED Final   Candida krusei NOT DETECTED NOT DETECTED Final   Candida parapsilosis NOT DETECTED NOT DETECTED Final   Candida tropicalis NOT DETECTED NOT DETECTED Final   Cryptococcus neoformans/gattii NOT  DETECTED NOT DETECTED Final    Comment: Performed at [facility], [address] [place],  [address]  Resp Panel by RT-PCR (Flu A&B, Covid) Anterior Nasal Swab     Status: None   Collection Time: 11/14/21  9:47 PM   Specimen: Anterior Nasal Swab  Result Value Ref Range Status   SARS Coronavirus  2 by RT PCR NEGATIVE NEGATIVE Final    Comment: (NOTE) SARS-CoV-2 target nucleic acids are NOT DETECTED.  The SARS-CoV-2 RNA is generally detectable in upper respiratory specimens during the acute phase of infection. The lowest concentration of SARS-CoV-2 viral copies this assay can detect is 138 copies/mL. A negative result does not preclude SARS-Cov-2 infection and should not be used as the sole basis for treatment or other patient management decisions. A negative result may occur with  improper specimen collection/handling, submission of specimen other than nasopharyngeal swab, presence of viral mutation(s) within the areas targeted by this assay, and inadequate number of viral copies(<138 copies/mL). A negative result must be combined with clinical observations, patient history, and epidemiological information. The expected result is Negative.  Fact Sheet for Patients:  EntrepreneurPulse.com.au  Fact Sheet for Healthcare Providers:  IncredibleEmployment.be  This test is no t yet approved or cleared by the [place] FDA and  has been authorized for detection and/or diagnosis of SARS-CoV-2 by FDA under an Emergency Use Authorization (EUA). This EUA will remain  in effect (meaning this test can be used) for the duration of the COVID-19 declaration under Section 564(b)(1) of the Act, 21 U.S.C.section 360bbb-3(b)(1), unless the authorization is terminated  or revoked sooner.       Influenza A by PCR NEGATIVE NEGATIVE Final   Influenza B by PCR NEGATIVE NEGATIVE Final    Comment: (NOTE) The Xpert Xpress SARS-CoV-2/FLU/RSV plus assay  is intended as an aid in the diagnosis of influenza from Nasopharyngeal swab specimens and should not be used as a sole basis for treatment. Nasal washings and aspirates are unacceptable for Xpert Xpress SARS-CoV-2/FLU/RSV testing.  Fact Sheet for Patients: EntrepreneurPulse.com.au  Fact Sheet for Healthcare Providers: IncredibleEmployment.be  This test is not yet approved or cleared by the [place] FDA and has been authorized for detection and/or diagnosis of SARS-CoV-2 by FDA under an Emergency Use Authorization (EUA). This EUA will remain in effect (meaning this test can be used) for the duration of the COVID-19 declaration under Section 564(b)(1) of the Act, 21 U.S.C. section 360bbb-3(b)(1), unless the authorization is terminated or revoked.  Performed at [facility], [address], [place], [place] [address]   Urine Culture     Status: Abnormal   Collection Time: 11/14/21  9:54 PM   Specimen: Urine, Catheterized  Result Value Ref Range Status   Specimen Description   Final    URINE, CATHETERIZED Performed at [facility], [address], [place], [place] [address]    Special Requests   Final    NONE Performed at [facility], [address] [place], [place] [address]    Culture MULTIPLE SPECIES PRESENT, SUGGEST RECOLLECTION (A)  Final   Report Status 11/16/2021 FINAL  Final  MRSA Next Gen by PCR, Nasal     Status: Abnormal   Collection Time: 11/15/21  9:49 AM   Specimen: Nasal Mucosa; Nasal Swab  Result Value Ref Range Status   MRSA by PCR Next Gen DETECTED (A) NOT DETECTED Final    Comment: RESULT CALLED TO, READ BACK BY AND VERIFIED WITH:  C. Auston @ [phone] by Cedric Fishman 11/15/21 (NOTE) The GeneXpert MRSA Assay (FDA approved for NASAL specimens only), is one component of a comprehensive MRSA colonization surveillance program. It is not intended to diagnose MRSA infection nor to guide or monitor treatment for MRSA  infections. Test performance is not FDA approved in patients less than 3 years old. Performed at [facility], [address]  [place], [place] [address]   Urine Culture     Status: Abnormal   Collection Time: 11/15/21 12:28 PM   Specimen: Urine, Clean Catch  Result Value Ref Range Status   Specimen Description   Final    URINE, CLEAN CATCH Performed at [facility], [address], [place], [place] [address]    Special Requests   Final    NONE Performed at [facility], [address], [place], [place] [address]    Culture (A)  Final    <10,000 COLONIES/mL INSIGNIFICANT GROWTH Performed at [place] [address], [place], [place] [address]    Report Status 11/16/2021 FINAL  Final     Radiology Studies: No results found.   Marzetta Board, MD, PhD [facility]  Between 7 am - 7 pm I am available, please contact me via Amion (for emergencies) or Securechat (non urgent messages)  Between 7 pm - 7 am I am not available, please contact night coverage MD/APP via Amion

## 2021-11-17 NOTE — Progress Notes (Signed)
*  PRELIMINARY RESULTS* Echocardiogram 2D Echocardiogram has been performed.  [name] 11/17/2021, 4:18 PM

## 2021-11-18 DIAGNOSIS — G9341 Metabolic encephalopathy: Secondary | ICD-10-CM | POA: Diagnosis not present

## 2021-11-18 DIAGNOSIS — J9601 Acute respiratory failure with hypoxia: Secondary | ICD-10-CM | POA: Diagnosis not present

## 2021-11-18 DIAGNOSIS — I953 Hypotension of hemodialysis: Secondary | ICD-10-CM | POA: Diagnosis not present

## 2021-11-18 DIAGNOSIS — A419 Sepsis, unspecified organism: Secondary | ICD-10-CM | POA: Diagnosis not present

## 2021-11-18 DIAGNOSIS — E079 Disorder of thyroid, unspecified: Secondary | ICD-10-CM

## 2021-11-18 LAB — GLUCOSE, CAPILLARY
Glucose-Capillary: 100 mg/dL — ABNORMAL HIGH (ref 70–99)
Glucose-Capillary: 102 mg/dL — ABNORMAL HIGH (ref 70–99)
Glucose-Capillary: 108 mg/dL — ABNORMAL HIGH (ref 70–99)
Glucose-Capillary: 118 mg/dL — ABNORMAL HIGH (ref 70–99)
Glucose-Capillary: 44 mg/dL — CL (ref 70–99)
Glucose-Capillary: 62 mg/dL — ABNORMAL LOW (ref 70–99)
Glucose-Capillary: 84 mg/dL (ref 70–99)
Glucose-Capillary: 97 mg/dL (ref 70–99)

## 2021-11-18 LAB — COMPREHENSIVE METABOLIC PANEL
ALT: 40 U/L (ref 0–44)
AST: 46 U/L — ABNORMAL HIGH (ref 15–41)
Albumin: 2 g/dL — ABNORMAL LOW (ref 3.5–5.0)
Alkaline Phosphatase: 101 U/L (ref 38–126)
Anion gap: 2 — ABNORMAL LOW (ref 5–15)
BUN: 28 mg/dL — ABNORMAL HIGH (ref 8–23)
CO2: 17 mmol/L — ABNORMAL LOW (ref 22–32)
Calcium: 7.6 mg/dL — ABNORMAL LOW (ref 8.9–10.3)
Chloride: 117 mmol/L — ABNORMAL HIGH (ref 98–111)
Creatinine, Ser: 0.51 mg/dL (ref 0.44–1.00)
GFR, Estimated: >60 mL/min (ref >60)
Glucose, Bld: 90 mg/dL (ref 70–99)
Potassium: 5 mmol/L (ref 3.5–5.1)
Sodium: 136 mmol/L (ref 135–145)
Total Bilirubin: 0.6 mg/dL (ref 0.3–1.2)
Total Protein: 5.2 g/dL — ABNORMAL LOW (ref 6.5–8.1)

## 2021-11-18 LAB — CULTURE, BLOOD (ROUTINE X 2): Special Requests: ADEQUATE

## 2021-11-18 LAB — CBC
HCT: 26.2 % — ABNORMAL LOW (ref 36.0–46.0)
Hemoglobin: 8.2 g/dL — ABNORMAL LOW (ref 12.0–15.0)
MCH: 27.3 pg (ref 26.0–34.0)
MCHC: 31.3 g/dL (ref 30.0–36.0)
MCV: 87.3 fL (ref 80.0–100.0)
Platelets: 264 10*3/uL (ref 150–400)
RBC: 3 MIL/uL — ABNORMAL LOW (ref 3.87–5.11)
RDW: 17.3 % — ABNORMAL HIGH (ref 11.5–15.5)
WBC: 12 10*3/uL — ABNORMAL HIGH (ref 4.0–10.5)
nRBC: 0 % (ref 0.0–0.2)

## 2021-11-18 LAB — MAGNESIUM: Magnesium: 1.7 mg/dL (ref 1.7–2.4)

## 2021-11-18 MED ORDER — DEXTROSE 50 % IV SOLN
12.5000 g | INTRAVENOUS | Status: AC
  Administered 2021-11-18: 12.5 g via INTRAVENOUS

## 2021-11-18 MED ORDER — SODIUM CHLORIDE 0.9 % IV SOLN: INTRAVENOUS | Status: DC | PRN

## 2021-11-18 MED ORDER — DEXTROSE 50 % IV SOLN
INTRAVENOUS | Status: AC
  Filled 2021-11-18: qty 50

## 2021-11-18 NOTE — Progress Notes (Signed)
Pharmacy Antibiotic Note  [name] is a 76 y.o. female admitted on 11/14/2021 with septic shock- unclear source.  Pharmacy has been consulted for meropenem dosing. Patient has noted allergy to rocephin (hives) and ertapenem (unknown reaction), but the provider would like to try meropenem at this time. The patient has already received one dose of levaquin. CrCl ~40 mL/min, will dose reduce  Plan: Meropenem 1G IV q12 hours  Height: '5\' 8"'$  (172.7 cm) Weight: 114 kg (251 lb 5.2 oz) IBW/kg (Calculated) : 63.9  Temp (24hrs), Avg:98.2 F (36.8 C), Min:98 F (36.7 C), Max:98.4 F (36.9 C)  Recent Labs  Lab 11/14/21 2053 11/14/21 2124 11/15/21 0031 11/15/21 0352 11/15/21 0718 11/15/21 1033 11/16/21 0447 11/17/21 0331 11/18/21 0310  WBC 8.9  --   --  23.8* 20.2* 21.3* 24.5*  --  12.0*  CREATININE 1.61*  --   --  1.26*  --   --  1.02* 0.72 0.51  LATICACIDVEN  --  3.0* 2.2* 1.4  --   --   --   --   --      Estimated Creatinine Clearance: 83 mL/min (by C-G formula based on SCr of 0.51 mg/dL).    Allergies  Allergen Reactions   Invanz [Ertapenem] Other (See Comments)    Patient states allergy but is not certain    Propranolol Hcl    Rocephin [Ceftriaxone] Hives   Vancomycin Hives    Antimicrobials this admission: levaquin 8/20 >> 8/20 Merrem 8/21>> Doxycycline 8/21>>  Microbiology results: 8/20 BCx: staph species bottle Staph capitis 8/21 UCx: insignificant growth 8/20 Ucx: multiple species 8/21 MRSA PCR is positive  Thank you for allowing pharmacy to be a part of this patient's care.  Ramond Craver 11/18/2021 1:05 PM

## 2021-11-18 NOTE — Progress Notes (Signed)
Blood sugar 62. Hypoglycemia protocol initiated. Pt drank one orange juice and eating PB cracker. Denies symptoms. Bryson Corona Edd Fabian

## 2021-11-18 NOTE — Progress Notes (Signed)
Hypoglycemic Event  CBG: 62  Treatment: D50 25 mL (12.5 gm) and fisrt 8 oz juice  Symptoms: Pale  Follow-up CBG: [id number] CBG Result:44  Possible Reasons for Event: Other:    Comments/MD notified:Adef[place]o    [place]

## 2021-11-18 NOTE — Progress Notes (Signed)
PROGRESS NOTE  ACHOL AZPEITIA YQI:[id number] DOB: June 16, 1945 DOA: 11/14/2021 PCP: Hilbert Corrigan, MD   LOS: 4 days   Brief Narrative / Interim history: 76 year old female with COPD, asthma, DM 2, paraplegic spinal paralysis, recurrent UTIs, hypertension, living for the past several years in an SNF was brought to the hospital due to being unresponsive.  Patient tells me that over the last couple of weeks she has been feeling weak, poor appetite, and noted that her blood pressure was on the soft side.  She was found to have sepsis due to presumed UTI in the setting of a chronic Foley, and was admitted to the ICU since she is requiring pressors.  Pertinent data: Chest x-ray 8/20-no active disease Head CT 8/20-negative Chest x-ray 8/21-no acute cardiopulmonary disease, cardiomegaly Blood cultures 8/20-1 out of 4 bottles Staphylococcus capitis Blood culture reflex -staph species Urine cultures 8/21-insignificant growth  Subjective / 24h Interval events: She is feeling a little bit better this morning.  No nausea or vomiting.  No chest pain.  Assesement and Plan: Principal Problem:   Sepsis ([place]) Active Problems:   Hypotension   Acute metabolic encephalopathy   Acute respiratory failure with hypoxia (HCC)   AKI (acute kidney injury) ([place])   Essential hypertension   Thyroid disease   Hypothyroidism   UTI (urinary tract infection)   Pressure injury of skin   Principal problem Septic shock-source not entirely clear.  She had SIRS criteria with fevers, hypotension requiring pressors, tachypnea.  Urinalysis showed pyuria no bacteria was seen.  Urine cultures without insignificant growth.  She does have 1/4 blood cultures positive for Staph capitis which is likely contaminant.   -Allergies to Invanz, Rocephin and vancomycin -Continue IV meropenem and doxycycline pending final culture data -WBC is down to 12.0 from a high of 24.5 this admission  Active problems Acute metabolic  encephalopathy-resolved with antibiotics and pressors, back to baseline  Acute anemia---??  Etiology... Some hemodilution component due to aggressive IV fluids for hypotension and septic shock -Hgb currently above 8 baseline 12-13 -No obvious bleeding  H/o Essential hypertension-hold home medications , currently off pressors/Levophed -Okay to continue midodrine for pressure support  Acute kidney injury-due to septic shock, creatinine has normalized with improvement in hemodynamics (creatinine peaked at 1.61 this admission, baseline typically around 0.6)  Acute  hypoxic respiratory failure-successfully weaned off oxygen on 11/18/21  Hypothyroidism-continue Synthroid, TSH normal at 2.4   Class 2 Obesity- -Low calorie diet, portion control and increase physical activity discussed with patient -Body mass index is 38.21 kg/m.   Hyperlipidemia-continue Zetia and atorvastatin  Paraplegia lower extremity----with history of chronic urinary retention and nephrolithiasis--previously had chronic Foley -Foley was successfully removed a few weeks ago prior to admission -New Foley was placed in the ED on admission -This new Foley was discontinued/removed on 11/18/2021--- attempting voiding trial  Disposition--- weaned off pressors wean off oxygen Foley removed...  -Possible transition to oral antibiotics 1 to 2 days pending further culture data -Possible discharge back to [facility] in 1 to 2 days if continues to improve from sepsis and hemodynamic standpoint   Multiple pressure ulcers, POA -wound care consult appreciated  None of the wounds look with active infection              Scheduled Meds:  aspirin EC  81 mg Oral Daily   atorvastatin  10 mg Oral Daily   Chlorhexidine Gluconate Cloth  6 each Topical Daily   ezetimibe  10 mg Oral QHS  gabapentin  300 mg Oral BID   heparin  5,000 Units Subcutaneous Q8H   insulin aspart  0-15 Units Subcutaneous TID WC   leptospermum manuka  honey  1 Application Topical Daily   levothyroxine  150 mcg Oral Daily   midodrine  10 mg Oral TID WC   mirabegron ER  25 mg Oral Daily   mupirocin ointment   Nasal BID   pantoprazole  40 mg Oral Daily   Continuous Infusions:  sodium chloride     doxycycline (VIBRAMYCIN) IV Stopped (11/18/21 1237)   meropenem (MERREM) IV Stopped (11/18/21 0841)   norepinephrine (LEVOPHED) Adult infusion Stopped (11/17/21 1323)   PRN Meds:.sodium chloride, acetaminophen **OR** acetaminophen, alum & mag hydroxide-simeth, ipratropium-albuterol, naLOXone (NARCAN)  injection, ondansetron **OR** ondansetron (ZOFRAN) IV, oxyCODONE, polyethylene glycol  Diet Orders (From admission, onward)     Start     Ordered   11/15/21 0007  Diet heart healthy/carb modified Room service appropriate? Yes; Fluid consistency: Thin  Diet effective now       Question Answer Comment  Diet-HS Snack? Nothing   Room service appropriate? Yes   Fluid consistency: Thin      11/15/21 0007           DVT prophylaxis: heparin injection 5,000 Units Start: 11/15/21 0600 SCDs Start: 11/15/21 0007  Lab Results  Component Value Date   PLT 264 11/18/2021      Code Status: Full Code  Family Communication: No family at bedside  Status is: Inpatient Remains inpatient appropriate because: Still on pressors   Level of care: ICU  Consultants:  None  Objective: Vitals:   11/18/21 1045 11/18/21 1100 11/18/21 1215 11/18/21 1300  BP: (!) 132/49 (!) 122/51  (!) 152/60  Pulse: 83 72  75  Resp:    (!) 23  Temp:   98 F (36.7 C)   TempSrc:   Oral   SpO2: 99% 99%  99%  Weight:      Height:        Intake/Output Summary (Last 24 hours) at 11/18/2021 1446 Last data filed at 11/18/2021 1300 Gross per 24 hour  Intake 3687.15 ml  Output 3400 ml  Net 287.15 ml   Wt Readings from Last 3 Encounters:  11/18/21 114 kg  06/04/20 111.6 kg  06/10/16 120.2 kg    Examination:  Physical Exam  Gen:- Awake Alert, obese, in no  acute distress  HEENT:- [place].AT, No sclera icterus, left IJ central line Nose- [place] 2L/min--- removed on 11/18/2021 Neck-Supple Neck,No JVD,.  Lungs-  CTAB , fair air movement bilaterally  CV- S1, S2 normal, RRR Abd-  +ve B.Sounds, Abd Soft, No tenderness,    Extremity/Skin:- No  edema,   good pedal pulses  Psych-affect is appropriate, oriented x3 Neuro-bedbound at baseline with lower extremity paraplegia , no new focal deficits, no tremors GU--Foley in situ to be removed on 11/18/2021    Data Reviewed: I have independently reviewed following labs and imaging studies   CBC Recent Labs  Lab 11/14/21 2053 11/15/21 0352 11/15/21 0718 11/15/21 1033 11/16/21 0447 11/18/21 0310  WBC 8.9 23.8* 20.2* 21.3* 24.5* 12.0*  HGB 13.9 9.2* 9.4* 9.1* 9.2* 8.2*  HCT 42.5 30.1* 30.3* 28.9* 28.0* 26.2*  PLT 353 369 320 343 372 264  MCV 105.7* 89.9 89.9 88.4 85.1 87.3  MCH 34.6* 27.5 27.9 27.8 28.0 27.3  MCHC 32.7 30.6 31.0 31.5 32.9 31.3  RDW 13.5 17.1* 16.9* 17.1* 16.8* 17.3*  LYMPHSABS 0.8 0.7  --   --   --   --  MONOABS 0.2 1.3*  --   --   --   --   EOSABS 0.1 0.0  --   --   --   --   BASOSABS 0.1 0.0  --   --   --   --     Recent Labs  Lab 11/14/21 2048 11/14/21 2053 11/14/21 2124 11/14/21 2132 11/15/21 0031 11/15/21 0352 11/15/21 0353 11/16/21 0447 11/17/21 0331 11/18/21 0310  NA  --  124*  --   --   --  128*  --  130* 135 136  K  --  4.9  --   --   --  4.2  --  2.7* 4.8 5.0  CL  --  98  --   --   --  104  --  109 117* 117*  CO2  --  14*  --   --   --  13*  --  15* 16* 17*  GLUCOSE  --  153*  --   --   --  236*  --  158* 123* 90  BUN  --  70*  --   --   --  60*  --  60* 45* 28*  CREATININE  --  1.61*  --   --   --  1.26*  --  1.02* 0.72 0.51  CALCIUM  --  8.5*  --   --   --  7.4*  --  7.1* 7.2* 7.6*  AST  --  46*  --   --   --  50*  --   --   --  46*  ALT  --  29  --   --   --  32  --   --   --  40  ALKPHOS  --  139*  --   --   --  130*  --   --   --  101  BILITOT  --  1.0   --   --   --  0.7  --   --   --  0.6  ALBUMIN  --  2.8*  --   --   --  2.3*  --   --   --  2.0*  MG  --   --   --   --   --  3.2*  --   --  2.3 1.7  PROCALCITON  --   --   --   --   --  6.76  --   --   --   --   LATICACIDVEN  --   --  3.0*  --  2.2* 1.4  --   --   --   --   INR  --  1.4*  --   --   --  1.5*  --   --   --   --   TSH  --   --   --   --   --   --  2.463  --   --   --   HGBA1C  --   --   --   --   --   --  6.0*  --   --   --   AMMONIA  --   --   --  11  --   --   --   --   --   --   BNP 27.0  --   --   --   --   --   --   --   --   --     ------------------------------------------------------------------------------------------------------------------  No results for input(s): "CHOL", "HDL", "LDLCALC", "TRIG", "CHOLHDL", "LDLDIRECT" in the last 72 hours.  Lab Results  Component Value Date   HGBA1C 6.0 (H) 11/15/2021   ------------------------------------------------------------------------------------------------------------------    Component Value Date/Time   BNP 27.0 11/14/2021 2048   CBG: Recent Labs  Lab 11/18/21 0355 11/18/21 0436 11/18/21 0500 11/18/21 0803 11/18/21 1152  GLUCAP 62* 44* 118* 108* 97    Recent Results (from the past 240 hour(s))  Blood Culture (routine x 2)     Status: None (Preliminary result)   Collection Time: 11/14/21  9:25 PM   Specimen: BLOOD LEFT HAND  Result Value Ref Range Status   Specimen Description BLOOD LEFT HAND  Final   Special Requests   Final    BOTTLES DRAWN AEROBIC ONLY Blood Culture adequate volume   Culture   Final    NO GROWTH 4 DAYS Performed at [facility], [address] [place], [place] [address]    Report Status PENDING  Incomplete  Blood Culture (routine x 2)     Status: Abnormal   Collection Time: 11/14/21  9:27 PM   Specimen: Left Antecubital; Blood  Result Value Ref Range Status   Specimen Description   Final    LEFT ANTECUBITAL Performed at [facility], [address] [place], [place]  [address]    Special Requests   Final    BOTTLES DRAWN AEROBIC ONLY Blood Culture adequate volume Performed at [facility], [address], [place], [place] [address]    Culture  Setup Time   Final    GRAM POSITIVE COCCI AEROBIC BOTTLE Gram Stain Report Called to,Read Back By and Verified With: MIZE,J'@[id number]'$  BY MATTHEWS, B 8.22.2023 CRITICAL RESULT CALLED TO, READ BACK BY AND VERIFIED WITH: PHARMD G.COFFEE AT 0950 ON 11/16/2021 BY T.SAAD.    Culture (A)  Final    STAPHYLOCOCCUS CAPITIS THE SIGNIFICANCE OF ISOLATING THIS ORGANISM FROM A SINGLE SET OF BLOOD CULTURES WHEN MULTIPLE SETS ARE DRAWN IS UNCERTAIN. PLEASE NOTIFY THE MICROBIOLOGY DEPARTMENT WITHIN ONE WEEK IF SPECIATION AND SENSITIVITIES ARE REQUIRED. Performed at [facility], [place] [address] [place], [place] [address]    Report Status 11/18/2021 FINAL  Final  Blood Culture ID Panel (Reflexed)     Status: Abnormal   Collection Time: 11/14/21  9:27 PM  Result Value Ref Range Status   Enterococcus faecalis NOT DETECTED NOT DETECTED Final   Enterococcus Faecium NOT DETECTED NOT DETECTED Final   Listeria monocytogenes NOT DETECTED NOT DETECTED Final   Staphylococcus species DETECTED (A) NOT DETECTED Final    Comment: CRITICAL RESULT CALLED TO, READ BACK BY AND VERIFIED WITH: PHARMD G.COFFEE AT 0950 ON 11/16/2021 BY T.SAAD.    Staphylococcus aureus (BCID) NOT DETECTED NOT DETECTED Final   Staphylococcus epidermidis NOT DETECTED NOT DETECTED Final   Staphylococcus lugdunensis NOT DETECTED NOT DETECTED Final   Streptococcus species NOT DETECTED NOT DETECTED Final   Streptococcus agalactiae NOT DETECTED NOT DETECTED Final   Streptococcus pneumoniae NOT DETECTED NOT DETECTED Final   Streptococcus pyogenes NOT DETECTED NOT DETECTED Final   A.calcoaceticus-baumannii NOT DETECTED NOT DETECTED Final   Bacteroides fragilis NOT DETECTED NOT DETECTED Final   Enterobacterales NOT DETECTED NOT DETECTED Final   Enterobacter cloacae complex  NOT DETECTED NOT DETECTED Final   Escherichia coli NOT DETECTED NOT DETECTED Final   Klebsiella aerogenes NOT DETECTED NOT DETECTED Final   Klebsiella oxytoca NOT DETECTED NOT DETECTED Final   Klebsiella pneumoniae NOT DETECTED NOT DETECTED Final   Proteus species  NOT DETECTED NOT DETECTED Final   Salmonella species NOT DETECTED NOT DETECTED Final   Serratia marcescens NOT DETECTED NOT DETECTED Final   Haemophilus influenzae NOT DETECTED NOT DETECTED Final   Neisseria meningitidis NOT DETECTED NOT DETECTED Final   Pseudomonas aeruginosa NOT DETECTED NOT DETECTED Final   Stenotrophomonas maltophilia NOT DETECTED NOT DETECTED Final   Candida albicans NOT DETECTED NOT DETECTED Final   Candida auris NOT DETECTED NOT DETECTED Final   Candida glabrata NOT DETECTED NOT DETECTED Final   Candida krusei NOT DETECTED NOT DETECTED Final   Candida parapsilosis NOT DETECTED NOT DETECTED Final   Candida tropicalis NOT DETECTED NOT DETECTED Final   Cryptococcus neoformans/gattii NOT DETECTED NOT DETECTED Final    Comment: Performed at [facility], [place] [address] [place], [place] [address]  Resp Panel by RT-PCR (Flu A&B, Covid) Anterior Nasal Swab     Status: None   Collection Time: 11/14/21  9:47 PM   Specimen: Anterior Nasal Swab  Result Value Ref Range Status   SARS Coronavirus 2 by RT PCR NEGATIVE NEGATIVE Final    Comment: (NOTE) SARS-CoV-2 target nucleic acids are NOT DETECTED.  The SARS-CoV-2 RNA is generally detectable in upper respiratory specimens during the acute phase of infection. The lowest concentration of SARS-CoV-2 viral copies this assay can detect is 138 copies/mL. A negative result does not preclude SARS-Cov-2 infection and should not be used as the sole basis for treatment or other patient management decisions. A negative result may occur with  improper specimen collection/handling, submission of specimen other than nasopharyngeal swab, presence of viral mutation(s)  within the areas targeted by this assay, and inadequate number of viral copies(<138 copies/mL). A negative result must be combined with clinical observations, patient history, and epidemiological information. The expected result is Negative.  Fact Sheet for Patients:  EntrepreneurPulse.com.au  Fact Sheet for Healthcare Providers:  IncredibleEmployment.be  This test is no t yet approved or cleared by the [place] FDA and  has been authorized for detection and/or diagnosis of SARS-CoV-2 by FDA under an Emergency Use Authorization (EUA). This EUA will remain  in effect (meaning this test can be used) for the duration of the COVID-19 declaration under Section 564(b)(1) of the Act, 21 U.S.C.section 360bbb-3(b)(1), unless the authorization is terminated  or revoked sooner.       Influenza A by PCR NEGATIVE NEGATIVE Final   Influenza B by PCR NEGATIVE NEGATIVE Final    Comment: (NOTE) The Xpert Xpress SARS-CoV-2/FLU/RSV plus assay is intended as an aid in the diagnosis of influenza from Nasopharyngeal swab specimens and should not be used as a sole basis for treatment. Nasal washings and aspirates are unacceptable for Xpert Xpress SARS-CoV-2/FLU/RSV testing.  Fact Sheet for Patients: EntrepreneurPulse.com.au  Fact Sheet for Healthcare Providers: IncredibleEmployment.be  This test is not yet approved or cleared by the [place] FDA and has been authorized for detection and/or diagnosis of SARS-CoV-2 by FDA under an Emergency Use Authorization (EUA). This EUA will remain in effect (meaning this test can be used) for the duration of the COVID-19 declaration under Section 564(b)(1) of the Act, 21 U.S.C. section 360bbb-3(b)(1), unless the authorization is terminated or revoked.  Performed at [facility], [address], [place], [place] [address]   Urine Culture     Status: Abnormal   Collection Time:  11/14/21  9:54 PM   Specimen: Urine, Catheterized  Result Value Ref Range Status   Specimen Description   Final    URINE, CATHETERIZED  Performed at [facility], [address], [place], [place] [address]    Special Requests   Final    NONE Performed at [facility], [address] [place], [place] [address]    Culture MULTIPLE SPECIES PRESENT, SUGGEST RECOLLECTION (A)  Final   Report Status 11/16/2021 FINAL  Final  MRSA Next Gen by PCR, Nasal     Status: Abnormal   Collection Time: 11/15/21  9:49 AM   Specimen: Nasal Mucosa; Nasal Swab  Result Value Ref Range Status   MRSA by PCR Next Gen DETECTED (A) NOT DETECTED Final    Comment: RESULT CALLED TO, READ BACK BY AND VERIFIED WITH:  C. Auston @ 0109 by Cedric Fishman 11/15/21 (NOTE) The GeneXpert MRSA Assay (FDA approved for NASAL specimens only), is one component of a comprehensive MRSA colonization surveillance program. It is not intended to diagnose MRSA infection nor to guide or monitor treatment for MRSA infections. Test performance is not FDA approved in patients less than 60 years old. Performed at [facility], [address] [place], [place] [address]   Urine Culture     Status: Abnormal   Collection Time: 11/15/21 12:28 PM   Specimen: Urine, Clean Catch  Result Value Ref Range Status   Specimen Description   Final    URINE, CLEAN CATCH Performed at [facility], [address], [place], [place] [address]    Special Requests   Final    NONE Performed at [facility], [address], [place], [place] [address]    Culture (A)  Final    <10,000 COLONIES/mL INSIGNIFICANT GROWTH Performed at [facility], [place] [address] [place], [place] [address]    Report Status 11/16/2021 FINAL  Final     Radiology Studies: ECHOCARDIOGRAM COMPLETE  Result Date: 11/17/2021    ECHOCARDIOGRAM REPORT   Patient Name:   JUNIPER COBEY Date of Exam: 11/17/2021 Medical Rec #:  [id number]      Height:       68.0 in Accession #:     [id number]     Weight:       247.8 lb Date of Birth:  [birth year]     BSA:          2.239 m Patient Age:    39 years       BP:           96/44 mmHg Patient Gender: F              HR:           81 bpm. Exam Location:  [facility] Procedure: 2D Echo, Cardiac Doppler and Color Doppler Indications:    Bacteremia R78.81  History:        Patient has prior history of Echocardiogram examinations, most                 recent 06/28/2006. Risk Factors:Hypertension and Diabetes. Obesity                 (From Hx), Paraplegia ([place]).  Sonographer:    Alvino Chapel RCS Referring Phys: [place]  1. Left ventricular ejection fraction, by estimation, is 65 to 70%. The left ventricle has normal function. The left ventricle has no regional wall motion abnormalities. There is mild concentric left ventricular hypertrophy. Left ventricular diastolic parameters are consistent with Grade II diastolic dysfunction (pseudonormalization).  2. Right ventricular systolic function is normal. The right ventricular size is normal. There is moderately elevated pulmonary artery  systolic pressure. The estimated right ventricular systolic pressure is 77.8 mmHg.  3. Left atrial size was mildly dilated.  4. The mitral valve is grossly normal. Trivial mitral valve regurgitation. No evidence of mitral stenosis.  5. The aortic valve was not well visualized. Aortic valve regurgitation is not visualized. Aortic valve sclerosis/calcification is present, without any evidence of aortic stenosis. Aortic valve mean gradient measures 10.0 mmHg. Aortic valve Vmax measures 2.16 m/s.  6. The inferior vena cava is dilated in size with >50% respiratory variability, suggesting right atrial pressure of 8 mmHg. Conclusion(s)/Recommendation(s): No evidence of valvular vegetations on this transthoracic echocardiogram. Consider a transesophageal echocardiogram to exclude infective endocarditis if clinically indicated. FINDINGS  Left Ventricle: Left  ventricular ejection fraction, by estimation, is 65 to 70%. The left ventricle has normal function. The left ventricle has no regional wall motion abnormalities. The left ventricular internal cavity size was normal in size. There is  mild concentric left ventricular hypertrophy. Left ventricular diastolic parameters are consistent with Grade II diastolic dysfunction (pseudonormalization). Right Ventricle: The right ventricular size is normal. No increase in right ventricular wall thickness. Right ventricular systolic function is normal. There is moderately elevated pulmonary artery systolic pressure. The tricuspid regurgitant velocity is 3.05 m/s, and with an assumed right atrial pressure of 8 mmHg, the estimated right ventricular systolic pressure is 24.2 mmHg. Left Atrium: Left atrial size was mildly dilated. Right Atrium: Right atrial size was normal in size. Pericardium: There is no evidence of pericardial effusion. Presence of epicardial fat layer. Mitral Valve: The mitral valve is grossly normal. Trivial mitral valve regurgitation. No evidence of mitral valve stenosis. Tricuspid Valve: The tricuspid valve is grossly normal. Tricuspid valve regurgitation is trivial. No evidence of tricuspid stenosis. Aortic Valve: The aortic valve was not well visualized. Aortic valve regurgitation is not visualized. Aortic valve sclerosis/calcification is present, without any evidence of aortic stenosis. Aortic valve mean gradient measures 10.0 mmHg. Aortic valve peak gradient measures 18.7 mmHg. Aortic valve area, by VTI measures 1.18 cm. Pulmonic Valve: The pulmonic valve was not well visualized. Pulmonic valve regurgitation is not visualized. Aorta: The aortic root is normal in size and structure. Venous: The inferior vena cava is dilated in size with greater than 50% respiratory variability, suggesting right atrial pressure of 8 mmHg. IAS/Shunts: The atrial septum is grossly normal.  LEFT VENTRICLE PLAX 2D LVIDd:          5.10 cm   Diastology LVIDs:         3.45 cm   LV e' medial:    7.51 cm/s LV PW:         1.25 cm   LV E/e' medial:  14.8 LV IVS:        1.10 cm   LV e' lateral:   8.05 cm/s LVOT diam:     1.70 cm   LV E/e' lateral: 13.8 LV SV:         58 LV SV Index:   26 LVOT Area:     2.27 cm  RIGHT VENTRICLE RV S prime:     12.50 cm/s TAPSE (M-mode): 2.7 cm LEFT ATRIUM             Index        RIGHT ATRIUM           Index LA diam:        3.90 cm 1.74 cm/m   RA Area:     25.60 cm LA Vol (A2C):   64.1 ml  28.63 ml/m  RA Volume:   86.20 ml  38.51 ml/m LA Vol (A4C):   87.5 ml 39.09 ml/m LA Biplane Vol: 76.2 ml 34.04 ml/m  AORTIC VALVE AV Area (Vmax):    1.15 cm AV Area (Vmean):   1.18 cm AV Area (VTI):     1.18 cm AV Vmax:           216.00 cm/s AV Vmean:          150.000 cm/s AV VTI:            0.493 m AV Peak Grad:      18.7 mmHg AV Mean Grad:      10.0 mmHg LVOT Vmax:         109.00 cm/s LVOT Vmean:        78.200 cm/s LVOT VTI:          0.256 m LVOT/AV VTI ratio: 0.52  AORTA Ao Root diam: 3.10 cm MITRAL VALVE                TRICUSPID VALVE MV Area (PHT): 3.60 cm     TR Peak grad:   37.2 mmHg MV Decel Time: 211 msec     TR Vmax:        305.00 cm/s MV E velocity: 111.00 cm/s MV A velocity: 79.30 cm/s   SHUNTS MV E/A ratio:  1.40         Systemic VTI:  0.26 m                             Systemic Diam: 1.70 cm Eleonore Chiquito MD Electronically signed by Eleonore Chiquito MD Signature Date/Time: 11/17/2021/4:26:56 PM    Final     Roxan Hockey, MD  [facility]  Between 7 am - 7 pm I am available, please contact me via Amion (for emergencies) or Securechat (non urgent messages)  Between 7 pm - 7 am I am not available, please contact night coverage MD/APP via Amion

## 2021-11-18 NOTE — Consult Note (Addendum)
WOC consult requested for multiple wounds.  This was already performed on 8/21; please refer to previous progress notes for assessment and measurements, and  topical treatment orders were provided for bedside nurses to perform.  Please re-consult if further assistance is needed.  Thank-you,  Julien Girt MSN, [place], [place], [place], [place]

## 2021-11-18 NOTE — Inpatient Diabetes Management (Signed)
Inpatient Diabetes Program Recommendations  AACE/ADA: New Consensus Statement on Inpatient Glycemic Control (2015)  Target Ranges:  Prepandial:   less than 140 mg/dL      Peak postprandial:   less than 180 mg/dL (1-2 hours)      Critically ill patients:  140 - 180 mg/dL   Lab Results  Component Value Date   GLUCAP 97 11/18/2021   HGBA1C 6.0 (H) 11/15/2021    Review of Glycemic Control  Latest Reference Range & Units 11/17/21 11:20 11/17/21 16:10 11/17/21 19:53 11/17/21 23:29 11/18/21 03:55 11/18/21 04:36 11/18/21 05:00 11/18/21 08:03  Glucose-Capillary 70 - 99 mg/dL 140 (H) Novolog 2 units 98 125 (H) 97 62 (L) 44 (LL) 118 (H) 108 (H)   Diabetes history: DM2 Outpatient Diabetes medications: None Current orders for Inpatient glycemic control: Novolog 0-15 units tid  Inpatient Diabetes Program Recommendations:   Noted hypoglycemia post correction several hrs prior. -Decrease Novolog correction to 0-6 units tid  Thank you, Nani Gasser. Dylan Monforte, RN, MSN, CDE  Diabetes Coordinator Inpatient Glycemic Control Team Team Pager [phone] (8am-5pm) 11/18/2021 1:02 PM

## 2021-11-19 LAB — BASIC METABOLIC PANEL
Anion gap: 4 — ABNORMAL LOW (ref 5–15)
BUN: 17 mg/dL (ref 8–23)
CO2: 19 mmol/L — ABNORMAL LOW (ref 22–32)
Calcium: 8.2 mg/dL — ABNORMAL LOW (ref 8.9–10.3)
Chloride: 113 mmol/L — ABNORMAL HIGH (ref 98–111)
Creatinine, Ser: 0.34 mg/dL — ABNORMAL LOW (ref 0.44–1.00)
GFR, Estimated: >60 mL/min (ref >60)
Glucose, Bld: 100 mg/dL — ABNORMAL HIGH (ref 70–99)
Potassium: 4.7 mmol/L (ref 3.5–5.1)
Sodium: 136 mmol/L (ref 135–145)

## 2021-11-19 LAB — CBC
HCT: 29.4 % — ABNORMAL LOW (ref 36.0–46.0)
Hemoglobin: 9.1 g/dL — ABNORMAL LOW (ref 12.0–15.0)
MCH: 27.3 pg (ref 26.0–34.0)
MCHC: 31 g/dL (ref 30.0–36.0)
MCV: 88.3 fL (ref 80.0–100.0)
Platelets: 284 10*3/uL (ref 150–400)
RBC: 3.33 MIL/uL — ABNORMAL LOW (ref 3.87–5.11)
RDW: 17.8 % — ABNORMAL HIGH (ref 11.5–15.5)
WBC: 12.5 10*3/uL — ABNORMAL HIGH (ref 4.0–10.5)
nRBC: 0.2 % (ref 0.0–0.2)

## 2021-11-19 LAB — CULTURE, BLOOD (ROUTINE X 2)
Culture: NO GROWTH
Special Requests: ADEQUATE

## 2021-11-19 LAB — GLUCOSE, CAPILLARY
Glucose-Capillary: 78 mg/dL (ref 70–99)
Glucose-Capillary: 80 mg/dL (ref 70–99)
Glucose-Capillary: 97 mg/dL (ref 70–99)
Glucose-Capillary: 87 mg/dL (ref 70–99)

## 2021-11-19 MED ORDER — TAMSULOSIN HCL 0.4 MG PO CAPS
0.4000 mg | ORAL_CAPSULE | Freq: Every day | ORAL | Status: DC
  Administered 2021-11-19: 0.4 mg via ORAL
  Filled 2021-11-19: qty 1

## 2021-11-19 MED ORDER — DOXYCYCLINE HYCLATE 100 MG PO TABS: 100.0000 mg | ORAL_TABLET | Freq: Two times a day (BID) | ORAL | 0 refills | Status: AC

## 2021-11-19 MED ORDER — TAMSULOSIN HCL 0.4 MG PO CAPS: 0.4000 mg | ORAL_CAPSULE | Freq: Every day | ORAL | 3 refills | Status: AC

## 2021-11-19 MED ORDER — OXYCODONE HCL 5 MG PO CAPS: 5.0000 mg | ORAL_CAPSULE | Freq: Two times a day (BID) | ORAL | 0 refills | Status: DC

## 2021-11-19 MED ORDER — MEDIHONEY WOUND/BURN DRESSING EX PSTE: 1.0000 | PASTE | Freq: Every day | CUTANEOUS | 3 refills | Status: DC

## 2021-11-19 MED ORDER — FUROSEMIDE 20 MG PO TABS: 20.0000 mg | ORAL_TABLET | Freq: Every day | ORAL | 3 refills | Status: DC

## 2021-11-19 MED ORDER — IPRATROPIUM-ALBUTEROL 0.5-2.5 (3) MG/3ML IN SOLN: 3.0000 mL | Freq: Four times a day (QID) | RESPIRATORY_TRACT | 2 refills | Status: AC | PRN

## 2021-11-19 MED ORDER — SODIUM CHLORIDE 0.9 % IV SOLN
1.0000 g | Freq: Once | INTRAVENOUS | Status: AC
  Administered 2021-11-19: 1 g via INTRAVENOUS
  Filled 2021-11-19: qty 20

## 2021-11-19 MED ORDER — PANTOPRAZOLE SODIUM 40 MG PO TBEC: 40.0000 mg | DELAYED_RELEASE_TABLET | Freq: Every day | ORAL | 2 refills | Status: DC

## 2021-11-19 NOTE — Discharge Summary (Signed)
[name], is a 76 y.o. female  DOB [birth year]  MRN [id number].  Admission date:  11/14/2021  Admitting Physician  Rolla Plate, DO  Discharge Date:  11/19/2021   Primary MD  Hilbert Corrigan, MD  Recommendations for primary care physician for things to follow:   1) wound care ---apply Medihoney to right and left inner  and outer thigh wounds and left lower buttock Q day, then cover with foam dressing.  Change foam dressings Q 3 days or PRN soiling. 2. Apply moist gauze packing to right middle buttock Q day, using swab to fill, then cover with foam dressing.  (Change foam dressing Q 3 days or PRN soiling.) 2. Foam dressings to right inner groin, left buttock, right lower buttock, right upper thigh.  Change Q 3 days or PRN soiling. Apply thin layer (3 mm) to wound. - Apply Medihoney to right and left inner  and outer thigh wounds  and left lower buttock Q day, then cover with foam dressing.  Change foam dressings Q 3 days or PRN soiling. 2. Apply moist gauze packing to right middle buttock Q day, using swab to fill, then cover with foam dressing.  (Change foam dressing Q 3 days or PRN soiling.) 3. Foam dressings to right inner groin, left buttock, right lower buttock.  Change Q 3 days or PRN soiling.  4)Repeat CBC and BMP blood test on Tuesday, 11/23/2021--  Admission Diagnosis  Sepsis ([place]) [A41.9] Sepsis due to gram-negative UTI ([place]) [A41.50, N39.0]  Discharge Diagnosis  Sepsis ([place]) [A41.9] Sepsis due to gram-negative UTI ([place]) [A41.50, N39.0]    Principal Problem:   Sepsis ([place]) Active Problems:   Hypotension   Acute metabolic encephalopathy   Acute respiratory failure with hypoxia (HCC)   AKI (acute kidney injury) ([place])   Essential hypertension   Thyroid disease   Hypothyroidism   UTI (urinary tract infection)   Pressure injury of skin      Past Medical History:  Diagnosis  Date   Bipolar 1 disorder ([place])    COPD with asthma ([place])    Diabetes mellitus without complication ([place])    GERD (gastroesophageal reflux disease)    Hypertension    Obesity    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 female with medical history significant of bipolar 1 disorder, COPD with asthma, diabetes mellitus type 2, GERD, hypertension, obesity, paraplegic spinal paralysis, pyelonephritis, thyroid disease, and more presents to ED with a chief complaint of unresponsiveness.  EMS was called at [facility] because patient was unresponsive.  It was reported when they found her she was hypoxic and started on nonrebreather.  She had IV fluid prior to arrival.  At arrival she got 2 mg of Narcan.  With fluid and oxygen patient did wake up, but remains hypersomnolent.  She is also confused when she wakes up, per ED provider report.  She did not wake up for me, but she did withdraw from pain.  Patient was found to be tensive and was given a 4 L bolus.  Given another bolus at admission.  Patient's blood pressures did not stabilize, so she was started on Levophed.  She was maxed out on 20 mcg of Levophed at time of admission.  Central line was requested from ED provider and is pending.  UA indicates UTI, is reported there was frank purulent drainage in her catheter.  CT of her head was negative.  Patient was started on Levaquin as she has a Rocephin allergy.  She was given Tylenol.  Blood cultures obtained.  Urine culture pending.  VBG shows normal PCO2.  Admission requested for management of sepsis secondary to UTI.  Unfortunately, patient is not waking up to provide me any history, so no further history can be obtained at this time. Review of Systems: unable to  review all systems due to the inability of the patient to answer questions.     Hospital Course:   Brief Narrative / Interim history: 76 year old female with COPD, asthma, DM 2, paraplegic spinal paralysis, recurrent UTIs, hypertension, living for the past several years in an SNF was brought to the hospital due to being unresponsive.  Patient tells me that over the last couple of weeks she has been feeling weak, poor appetite, and noted that her blood pressure was on the soft side.  She was found to have sepsis due to presumed UTI in the setting of a chronic Foley, and was admitted to the ICU as she required pressors initially   Pertinent data: Chest x-ray 8/20-no active disease Head CT 8/20-negative Chest x-ray 8/21-no acute cardiopulmonary disease, cardiomegaly Blood cultures 8/20-1 out of 4 bottles Staphylococcus capitis Blood culture reflex -staph species Urine cultures 8/21-insignificant growth    Assessment and Plan: Principal problem Septic shock--Staph epi bacteremia--POA -- she had SIRS criteria with fevers, hypotension requiring pressors, tachypnea.    Urine cultures without insignificant growth.  - She does have 1/4 blood cultures positive for Staph capitis-May be contaminant but given the fact that patient was very symptomatic initially she was empirically treated with IV meropenem and doxycycline x 5 days -Allergies to Invanz, Rocephin and Vancomycin -WBC is down to 12.5 from a high of 24.5 this admission -Okay to discharge on doxycycline -   Active problems Acute metabolic encephalopathy-resolved with antibiotics and pressors, back to baseline   Acute anemia---??  Etiology... Some hemodilution component due to aggressive IV fluids for hypotension and septic shock -Hgb currently above 9, baseline 12-13 -No obvious bleeding -Repeat CBC as outpatient   H/o Essential hypertension-resume home medications ,   -BP stable, discontinue midodrine   Acute kidney injury-due  to septic shock, creatinine has normalized with improvement in hemodynamics (creatinine peaked at 1.61 this admission, baseline typically around 0.6) -Creatinine is currently down to 0.34   Acute  hypoxic respiratory failure-successfully weaned off oxygen on 11/18/21   Hypothyroidism-continue Synthroid, TSH normal at 2.4   Class 2 Obesity- -Low calorie diet, portion control advised-Body mass index is 38.21 kg/m.   Hyperlipidemia-continue Zetia and atorvastatin   Paraplegia lower extremity----with history of chronic urinary retention and nephrolithiasis--previously had chronic Foley -Foley was successfully removed a few weeks ago prior to admission -New Foley was  placed in the ED on admission -This new Foley was discontinued/removed on 11/18/2021--- patient did well with voiding trial -Continue Flomax to help with nephrolithiasis/ stone passage  Multiple pressure ulcers, POA -wound care consult appreciated  None of the wounds look with active infection -Please see discharge instructions with wound care instructions     Disposition--- weaned off pressors wean off oxygen , Foley removed...  - transition to oral antibiotics -discharge back to [facility]   Discharge Condition: stable  Follow UP   Contact information for after-discharge care     Destination     [facility] .   Service: Skilled Nursing Contact information: [place] [phone]                     Diet and Activity recommendation:  As advised  Discharge Instructions    Discharge Instructions     Call MD for:  difficulty breathing, headache or visual disturbances   Complete by: As directed    Call MD for:  persistant dizziness or light-headedness   Complete by: As directed    Call MD for:  persistant nausea and vomiting   Complete by: As directed    Call MD for:  redness, tenderness, or signs of infection (pain, swelling, redness, odor or  green/yellow discharge around incision site)   Complete by: As directed    Call MD for:  temperature >100.4   Complete by: As directed    Diet - low sodium heart healthy   Complete by: As directed    Discharge instructions   Complete by: As directed    1) wound care ---apply Medihoney to right and left inner  and outer thigh wounds and left lower buttock Q day, then cover with foam dressing.  Change foam dressings Q 3 days or PRN soiling. 2. Apply moist gauze packing to right middle buttock Q day, using swab to fill, then cover with foam dressing.  (Change foam dressing Q 3 days or PRN soiling.) 2. Foam dressings to right inner groin, left buttock, right lower buttock, right upper thigh.  Change Q 3 days or PRN soiling. Apply thin layer (3 mm) to wound. - Apply Medihoney to right and left inner  and outer thigh wounds  and left lower buttock Q day, then cover with foam dressing.  Change foam dressings Q 3 days or PRN soiling. 2. Apply moist gauze packing to right middle buttock Q day, using swab to fill, then cover with foam dressing.  (Change foam dressing Q 3 days or PRN soiling.) 3. Foam dressings to right inner groin, left buttock, right lower buttock.  Change Q 3 days or PRN soiling.  4)Repeat CBC and BMP blood test on Tuesday, 11/23/2021--   Discharge wound care:   Complete by: As directed    1. Apply Medihoney to right and left inner  and outer thigh wounds  and left lower buttock Q day, then cover with foam dressing.  Change foam dressings Q 3 days or PRN soiling. 2. Apply moist gauze packing to right middle buttock Q day, using swab to fill, then cover with foam dressing.  (Change foam dressing Q 3 days or PRN soiling.) 2. Foam dressings to right inner groin, left buttock, right lower buttock.  Change Q 3 days or PRN soiling.   Increase activity slowly   Complete by: As directed          Discharge Medications  Allergies as of 11/19/2021       Reactions   Invanz [ertapenem]  Other (See Comments)   Patient states allergy but is not certain    Propranolol Hcl    Rocephin [ceftriaxone] Hives   Vancomycin Hives        Medication List     STOP taking these medications    aspirin-acetaminophen-caffeine 250-250-65 MG tablet Commonly known as: EXCEDRIN MIGRAINE   loratadine 10 MG tablet Commonly known as: CLARITIN   omeprazole 40 MG capsule Commonly known as: PRILOSEC Replaced by: pantoprazole 40 MG tablet       TAKE these medications    acetaminophen 325 MG tablet Commonly known as: TYLENOL Take 325 mg by mouth 2 (two) times daily. *May take one every 6 hours as needed for pain   ACIDOPHILUS PO Take 1 tablet by mouth in the morning and at bedtime.   allopurinol 100 MG tablet Commonly known as: ZYLOPRIM Take 100 mg by mouth daily.   ascorbic acid 500 MG tablet Commonly known as: VITAMIN C Take 500 mg by mouth 2 (two) times daily.   aspirin EC 81 MG tablet Take 81 mg by mouth daily.   atorvastatin 10 MG tablet Commonly known as: LIPITOR Take 10 mg by mouth daily.   azelastine 0.1 % nasal spray Commonly known as: ASTELIN Place 2 sprays into both nostrils 2 (two) times daily. Use in each nostril as directed   b complex vitamins tablet Take 1 tablet by mouth daily.   buPROPion 300 MG 24 hr tablet Commonly known as: WELLBUTRIN XL Take 300 mg by mouth daily.   CALCIUM CARBONATE-VITAMIN D3 PO Take 1 tablet by mouth in the morning and at bedtime.   Calmoseptine 0.44-20.6 % Oint Generic drug: Menthol-Zinc Oxide Apply 1 Application topically every 8 (eight) hours as needed (protection/prevention).   carvedilol 12.5 MG tablet Commonly known as: COREG Take 12.5 mg by mouth 2 (two) times daily with a meal.   CERTAGEN PO Take 1 tablet by mouth daily.   Cranberry 500 MG Tabs Take 1 capsule by mouth daily.   dextromethorphan-guaiFENesin 30-600 MG 12hr tablet Commonly known as: MUCINEX DM Take 1 tablet by mouth every 12 (twelve)  hours as needed for cough.   docusate sodium 100 MG capsule Commonly known as: COLACE Take 100 mg by mouth at bedtime.   doxycycline 100 MG tablet Commonly known as: VIBRA-TABS Take 1 tablet (100 mg total) by mouth 2 (two) times daily for 5 days.   ezetimibe 10 MG tablet Commonly known as: ZETIA Take 10 mg by mouth at bedtime.   famotidine 20 MG tablet Commonly known as: PEPCID Take 20 mg by mouth 2 (two) times daily.   fluticasone 50 MCG/ACT nasal spray Commonly known as: FLONASE Place 2 sprays into both nostrils daily.   furosemide 20 MG tablet Commonly known as: LASIX Take 1 tablet (20 mg total) by mouth daily. What changed:  medication strength how much to take   gabapentin 300 MG capsule Commonly known as: NEURONTIN Take 300 mg by mouth every 8 (eight) hours.   ipratropium-albuterol 0.5-2.5 (3) MG/3ML Soln Commonly known as: DUONEB Take 3 mLs by nebulization every 6 (six) hours as needed (shortness of breath).   Krill Oil 300 MG Caps Take 1 capsule by mouth 2 (two) times daily.   levothyroxine 150 MCG tablet Commonly known as: SYNTHROID Take 175 mcg by mouth daily.   lisinopril 2.5 MG tablet Commonly known as: ZESTRIL Take 2.5 mg by mouth  daily.   magnesium oxide 400 MG tablet Commonly known as: MAG-OX Take 400 mg by mouth daily.   mirabegron ER 25 MG Tb24 tablet Commonly known as: MYRBETRIQ Take 25 mg by mouth daily.   oxycodone 5 MG capsule Commonly known as: OXY-IR Take 1 capsule (5 mg total) by mouth in the morning and at bedtime.   pantoprazole 40 MG tablet Commonly known as: PROTONIX Take 1 tablet (40 mg total) by mouth daily. Start taking on: November 20, 2021 Replaces: omeprazole 40 MG capsule   Potassium Chloride ER 20 MEQ Tbcr Take 10 mEq by mouth daily.   PROMOGRAN EX Apply topically daily. What changed: Another medication with the same name was added. Make sure you understand how and when to take each.   leptospermum manuka honey  Pste paste Apply 1 Application topically daily. Apply Medihoney to right and left inner  and outer thigh wounds and left lower buttock Q day, then cover with foam dressing.  Change foam dressings Q 3 days or PRN soiling. 2. Apply moist gauze packing to right middle buttock Q day, using swab to fill, then cover with foam dressing.  (Change foam dressing Q 3 days or PRN soiling.) 2. Foam dressings to right inner groin, left buttock, right lower buttock, right upper thigh.  Change Q 3 days or PRN soiling. Apply thin layer (3 mm) to wound. Start taking on: November 20, 2021 What changed: You were already taking a medication with the same name, and this prescription was added. Make sure you understand how and when to take each.   tamsulosin 0.4 MG Caps capsule Commonly known as: FLOMAX Take 1 capsule (0.4 mg total) by mouth daily after supper.   topiramate 50 MG tablet Commonly known as: TOPAMAX Take 50 mg by mouth 2 (two) times daily.   Vitamin D (Ergocalciferol) 1.25 MG (50000 UNIT) Caps capsule Commonly known as: DRISDOL Take 50,000 Units by mouth every 7 (seven) days.               Discharge Care Instructions  (From admission, onward)           Start     Ordered   11/19/21 0000  Discharge wound care:       Comments: 1. Apply Medihoney to right and left inner  and outer thigh wounds  and left lower buttock Q day, then cover with foam dressing.  Change foam dressings Q 3 days or PRN soiling. 2. Apply moist gauze packing to right middle buttock Q day, using swab to fill, then cover with foam dressing.  (Change foam dressing Q 3 days or PRN soiling.) 2. Foam dressings to right inner groin, left buttock, right lower buttock.  Change Q 3 days or PRN soiling.   11/19/21 1532            Major procedures and Radiology Reports - PLEASE review detailed and final reports for all details, in brief -   ECHOCARDIOGRAM COMPLETE  Result Date: 11/17/2021    ECHOCARDIOGRAM REPORT   Patient  Name:   [name] Date of Exam: 11/17/2021 Medical Rec #:  [id number]      Height:       68.0 in Accession #:    [id number]     Weight:       247.8 lb Date of Birth:  [birth year]     BSA:          2.239 m Patient Age:    72 years  BP:           96/44 mmHg Patient Gender: F              HR:           81 bpm. Exam Location:  [facility] Procedure: 2D Echo, Cardiac Doppler and Color Doppler Indications:    Bacteremia R78.81  History:        Patient has prior history of Echocardiogram examinations, most                 recent 06/28/2006. Risk Factors:Hypertension and Diabetes. Obesity                 (From Hx), Paraplegia ([place]).  Sonographer:    Alvino Chapel RCS Referring Phys: [place]  1. Left ventricular ejection fraction, by estimation, is 65 to 70%. The left ventricle has normal function. The left ventricle has no regional wall motion abnormalities. There is mild concentric left ventricular hypertrophy. Left ventricular diastolic parameters are consistent with Grade II diastolic dysfunction (pseudonormalization).  2. Right ventricular systolic function is normal. The right ventricular size is normal. There is moderately elevated pulmonary artery systolic pressure. The estimated right ventricular systolic pressure is 23.5 mmHg.  3. Left atrial size was mildly dilated.  4. The mitral valve is grossly normal. Trivial mitral valve regurgitation. No evidence of mitral stenosis.  5. The aortic valve was not well visualized. Aortic valve regurgitation is not visualized. Aortic valve sclerosis/calcification is present, without any evidence of aortic stenosis. Aortic valve mean gradient measures 10.0 mmHg. Aortic valve Vmax measures 2.16 m/s.  6. The inferior vena cava is dilated in size with >50% respiratory variability, suggesting right atrial pressure of 8 mmHg. Conclusion(s)/Recommendation(s): No evidence of valvular vegetations on this transthoracic echocardiogram. Consider a  transesophageal echocardiogram to exclude infective endocarditis if clinically indicated. FINDINGS  Left Ventricle: Left ventricular ejection fraction, by estimation, is 65 to 70%. The left ventricle has normal function. The left ventricle has no regional wall motion abnormalities. The left ventricular internal cavity size was normal in size. There is  mild concentric left ventricular hypertrophy. Left ventricular diastolic parameters are consistent with Grade II diastolic dysfunction (pseudonormalization). Right Ventricle: The right ventricular size is normal. No increase in right ventricular wall thickness. Right ventricular systolic function is normal. There is moderately elevated pulmonary artery systolic pressure. The tricuspid regurgitant velocity is 3.05 m/s, and with an assumed right atrial pressure of 8 mmHg, the estimated right ventricular systolic pressure is 57.3 mmHg. Left Atrium: Left atrial size was mildly dilated. Right Atrium: Right atrial size was normal in size. Pericardium: There is no evidence of pericardial effusion. Presence of epicardial fat layer. Mitral Valve: The mitral valve is grossly normal. Trivial mitral valve regurgitation. No evidence of mitral valve stenosis. Tricuspid Valve: The tricuspid valve is grossly normal. Tricuspid valve regurgitation is trivial. No evidence of tricuspid stenosis. Aortic Valve: The aortic valve was not well visualized. Aortic valve regurgitation is not visualized. Aortic valve sclerosis/calcification is present, without any evidence of aortic stenosis. Aortic valve mean gradient measures 10.0 mmHg. Aortic valve peak gradient measures 18.7 mmHg. Aortic valve area, by VTI measures 1.18 cm. Pulmonic Valve: The pulmonic valve was not well visualized. Pulmonic valve regurgitation is not visualized. Aorta: The aortic root is normal in size and structure. Venous: The inferior vena cava is dilated in size with greater than 50% respiratory variability, suggesting  right atrial pressure of 8 mmHg. IAS/Shunts: The atrial septum  is grossly normal.  LEFT VENTRICLE PLAX 2D LVIDd:         5.10 cm   Diastology LVIDs:         3.45 cm   LV e' medial:    7.51 cm/s LV PW:         1.25 cm   LV E/e' medial:  14.8 LV IVS:        1.10 cm   LV e' lateral:   8.05 cm/s LVOT diam:     1.70 cm   LV E/e' lateral: 13.8 LV SV:         58 LV SV Index:   26 LVOT Area:     2.27 cm  RIGHT VENTRICLE RV S prime:     12.50 cm/s TAPSE (M-mode): 2.7 cm LEFT ATRIUM             Index        RIGHT ATRIUM           Index LA diam:        3.90 cm 1.74 cm/m   RA Area:     25.60 cm LA Vol (A2C):   64.1 ml 28.63 ml/m  RA Volume:   86.20 ml  38.51 ml/m LA Vol (A4C):   87.5 ml 39.09 ml/m LA Biplane Vol: 76.2 ml 34.04 ml/m  AORTIC VALVE AV Area (Vmax):    1.15 cm AV Area (Vmean):   1.18 cm AV Area (VTI):     1.18 cm AV Vmax:           216.00 cm/s AV Vmean:          150.000 cm/s AV VTI:            0.493 m AV Peak Grad:      18.7 mmHg AV Mean Grad:      10.0 mmHg LVOT Vmax:         109.00 cm/s LVOT Vmean:        78.200 cm/s LVOT VTI:          0.256 m LVOT/AV VTI ratio: 0.52  AORTA Ao Root diam: 3.10 cm MITRAL VALVE                TRICUSPID VALVE MV Area (PHT): 3.60 cm     TR Peak grad:   37.2 mmHg MV Decel Time: 211 msec     TR Vmax:        305.00 cm/s MV E velocity: 111.00 cm/s MV A velocity: 79.30 cm/s   SHUNTS MV E/A ratio:  1.40         Systemic VTI:  0.26 m                             Systemic Diam: 1.70 cm Eleonore Chiquito MD Electronically signed by Eleonore Chiquito MD Signature Date/Time: 11/17/2021/4:26:56 PM    Final    DG Chest Portable 1 View  Result Date: 11/15/2021 CLINICAL DATA:  76 year old female status post left IJ catheter placement. EXAM: PORTABLE CHEST 1 VIEW COMPARISON:  Chest x-ray 11/14/2021. FINDINGS: There is a left-sided internal jugular central venous catheter with tip terminating in the mid superior vena cava. Lung volumes are low. Study is limited by lack of visualization of the  right costophrenic sulcus. With these limitations in mind, no definite acute consolidative airspace disease. No pleural effusions. No pneumothorax. No evidence of pulmonary edema. Heart size appears mildly enlarged. The patient is rotated  to the right on today's exam, resulting in distortion of the mediastinal contours and reduced diagnostic sensitivity and specificity for mediastinal pathology. IMPRESSION: 1. Support apparatus, as above. 2. Low lung volumes without radiographic evidence of acute cardiopulmonary disease. 3. Cardiomegaly. Electronically Signed   By: Vinnie Langton M.D.   On: 11/15/2021 06:06   CT Head Wo Contrast  Result Date: 11/14/2021 CLINICAL DATA:  Neuro deficit, acute, stroke suspected Technologist notes state: Altered mental status.  Unresponsive. EXAM: CT HEAD WITHOUT CONTRAST TECHNIQUE: Contiguous axial images were obtained from the base of the skull through the vertex without intravenous contrast. RADIATION DOSE REDUCTION: This exam was performed according to the departmental dose-optimization program which includes automated exposure control, adjustment of the mA and/or kV according to patient size and/or use of iterative reconstruction technique. COMPARISON:  Head CT 03/27/2014 FINDINGS: Brain: No intracranial hemorrhage, mass effect, or midline shift. No hydrocephalus. The basilar cisterns are patent. No evidence of territorial infarct or acute ischemia. No extra-axial or intracranial fluid collection. Vascular: No hyperdense vessel or unexpected calcification. Skull: No fracture or focal lesion. Sinuses/Orbits: Paranasal sinuses and mastoid air cells are clear. The visualized orbits are unremarkable. Other: None. IMPRESSION: Negative noncontrast head CT. Electronically Signed   By: Keith Rake M.D.   On: 11/14/2021 22:57   DG Chest Port 1 View  Result Date: 11/14/2021 CLINICAL DATA:  Shortness of breath. EXAM: PORTABLE CHEST 1 VIEW COMPARISON:  Chest radiograph dated  04/03/2016. FINDINGS: No focal consolidation, pleural effusion or pneumothorax. Top-normal cardiac size. No acute osseous pathology. IMPRESSION: No active disease. Electronically Signed   By: Anner Crete M.D.   On: 11/14/2021 21:26    Micro Results  Recent Results (from the past 240 hour(s))  Blood Culture (routine x 2)     Status: None   Collection Time: 11/14/21  9:25 PM   Specimen: BLOOD LEFT HAND  Result Value Ref Range Status   Specimen Description BLOOD LEFT HAND  Final   Special Requests   Final    BOTTLES DRAWN AEROBIC ONLY Blood Culture adequate volume   Culture   Final    NO GROWTH 5 DAYS Performed at [facility], [address], [place], [place] [address]    Report Status 11/19/2021 FINAL  Final  Blood Culture (routine x 2)     Status: Abnormal   Collection Time: 11/14/21  9:27 PM   Specimen: Left Antecubital; Blood  Result Value Ref Range Status   Specimen Description   Final    LEFT ANTECUBITAL Performed at [facility], [address], [place], [place] [address]    Special Requests   Final    BOTTLES DRAWN AEROBIC ONLY Blood Culture adequate volume Performed at [facility], [address] [place], [place] [address]    Culture  Setup Time   Final    GRAM POSITIVE COCCI AEROBIC BOTTLE Gram Stain Report Called to,Read Back By and Verified With: MIZE,J'@[id number]'$  BY MATTHEWS, B 8.22.2023 CRITICAL RESULT CALLED TO, READ BACK BY AND VERIFIED WITH: PHARMD G.COFFEE AT 0950 ON 11/16/2021 BY T.SAAD.    Culture (A)  Final    STAPHYLOCOCCUS CAPITIS THE SIGNIFICANCE OF ISOLATING THIS ORGANISM FROM A SINGLE SET OF BLOOD CULTURES WHEN MULTIPLE SETS ARE DRAWN IS UNCERTAIN. PLEASE NOTIFY THE MICROBIOLOGY DEPARTMENT WITHIN ONE WEEK IF SPECIATION AND SENSITIVITIES ARE REQUIRED. Performed at [facility], [place] [address], [place], [place] [address]    Report Status 11/18/2021 FINAL  Final  Blood Culture ID Panel (Reflexed)  Status: Abnormal   Collection Time: 11/14/21  9:27 PM   Result Value Ref Range Status   Enterococcus faecalis NOT DETECTED NOT DETECTED Final   Enterococcus Faecium NOT DETECTED NOT DETECTED Final   Listeria monocytogenes NOT DETECTED NOT DETECTED Final   Staphylococcus species DETECTED (A) NOT DETECTED Final    Comment: CRITICAL RESULT CALLED TO, READ BACK BY AND VERIFIED WITH: PHARMD G.COFFEE AT 0950 ON 11/16/2021 BY T.SAAD.    Staphylococcus aureus (BCID) NOT DETECTED NOT DETECTED Final   Staphylococcus epidermidis NOT DETECTED NOT DETECTED Final   Staphylococcus lugdunensis NOT DETECTED NOT DETECTED Final   Streptococcus species NOT DETECTED NOT DETECTED Final   Streptococcus agalactiae NOT DETECTED NOT DETECTED Final   Streptococcus pneumoniae NOT DETECTED NOT DETECTED Final   Streptococcus pyogenes NOT DETECTED NOT DETECTED Final   A.calcoaceticus-baumannii NOT DETECTED NOT DETECTED Final   Bacteroides fragilis NOT DETECTED NOT DETECTED Final   Enterobacterales NOT DETECTED NOT DETECTED Final   Enterobacter cloacae complex NOT DETECTED NOT DETECTED Final   Escherichia coli NOT DETECTED NOT DETECTED Final   Klebsiella aerogenes NOT DETECTED NOT DETECTED Final   Klebsiella oxytoca NOT DETECTED NOT DETECTED Final   Klebsiella pneumoniae NOT DETECTED NOT DETECTED Final   Proteus species NOT DETECTED NOT DETECTED Final   Salmonella species NOT DETECTED NOT DETECTED Final   Serratia marcescens NOT DETECTED NOT DETECTED Final   Haemophilus influenzae NOT DETECTED NOT DETECTED Final   Neisseria meningitidis NOT DETECTED NOT DETECTED Final   Pseudomonas aeruginosa NOT DETECTED NOT DETECTED Final   Stenotrophomonas maltophilia NOT DETECTED NOT DETECTED Final   Candida albicans NOT DETECTED NOT DETECTED Final   Candida auris NOT DETECTED NOT DETECTED Final   Candida glabrata NOT DETECTED NOT DETECTED Final   Candida krusei NOT DETECTED NOT DETECTED Final   Candida parapsilosis NOT DETECTED NOT DETECTED Final   Candida tropicalis NOT  DETECTED NOT DETECTED Final   Cryptococcus neoformans/gattii NOT DETECTED NOT DETECTED Final    Comment: Performed at [facility], [address] [place], [place] [address]  Resp Panel by RT-PCR (Flu A&B, Covid) Anterior Nasal Swab     Status: None   Collection Time: 11/14/21  9:47 PM   Specimen: Anterior Nasal Swab  Result Value Ref Range Status   SARS Coronavirus 2 by RT PCR NEGATIVE NEGATIVE Final    Comment: (NOTE) SARS-CoV-2 target nucleic acids are NOT DETECTED.  The SARS-CoV-2 RNA is generally detectable in upper respiratory specimens during the acute phase of infection. The lowest concentration of SARS-CoV-2 viral copies this assay can detect is 138 copies/mL. A negative result does not preclude SARS-Cov-2 infection and should not be used as the sole basis for treatment or other patient management decisions. A negative result may occur with  improper specimen collection/handling, submission of specimen other than nasopharyngeal swab, presence of viral mutation(s) within the areas targeted by this assay, and inadequate number of viral copies(<138 copies/mL). A negative result must be combined with clinical observations, patient history, and epidemiological information. The expected result is Negative.  Fact Sheet for Patients:  EntrepreneurPulse.com.au  Fact Sheet for Healthcare Providers:  IncredibleEmployment.be  This test is no t yet approved or cleared by the [place] FDA and  has been authorized for detection and/or diagnosis of SARS-CoV-2 by FDA under an Emergency Use Authorization (EUA). This EUA will remain  in effect (meaning this test can be used) for the duration of the COVID-19 declaration under Section 564(b)(1) of the Act, 21  U.S.C.section 360bbb-3(b)(1), unless the authorization is terminated  or revoked sooner.       Influenza A by PCR NEGATIVE NEGATIVE Final   Influenza B by PCR NEGATIVE NEGATIVE Final     Comment: (NOTE) The Xpert Xpress SARS-CoV-2/FLU/RSV plus assay is intended as an aid in the diagnosis of influenza from Nasopharyngeal swab specimens and should not be used as a sole basis for treatment. Nasal washings and aspirates are unacceptable for Xpert Xpress SARS-CoV-2/FLU/RSV testing.  Fact Sheet for Patients: EntrepreneurPulse.com.au  Fact Sheet for Healthcare Providers: IncredibleEmployment.be  This test is not yet approved or cleared by the [place] FDA and has been authorized for detection and/or diagnosis of SARS-CoV-2 by FDA under an Emergency Use Authorization (EUA). This EUA will remain in effect (meaning this test can be used) for the duration of the COVID-19 declaration under Section 564(b)(1) of the Act, 21 U.S.C. section 360bbb-3(b)(1), unless the authorization is terminated or revoked.  Performed at [facility], [address], [place], [place] [address]   Urine Culture     Status: Abnormal   Collection Time: 11/14/21  9:54 PM   Specimen: Urine, Catheterized  Result Value Ref Range Status   Specimen Description   Final    URINE, CATHETERIZED Performed at [facility], [address] [place], [place] [address]    Special Requests   Final    NONE Performed at [facility], [address], [place], [place] [address]    Culture MULTIPLE SPECIES PRESENT, SUGGEST RECOLLECTION (A)  Final   Report Status 11/16/2021 FINAL  Final  MRSA Next Gen by PCR, Nasal     Status: Abnormal   Collection Time: 11/15/21  9:49 AM   Specimen: Nasal Mucosa; Nasal Swab  Result Value Ref Range Status   MRSA by PCR Next Gen DETECTED (A) NOT DETECTED Final    Comment: RESULT CALLED TO, READ BACK BY AND VERIFIED WITH:  C. Auston @ 2585 by Cedric Fishman 11/15/21 (NOTE) The GeneXpert MRSA Assay (FDA approved for NASAL specimens only), is one component of a comprehensive MRSA colonization surveillance program. It is not intended to diagnose MRSA  infection nor to guide or monitor treatment for MRSA infections. Test performance is not FDA approved in patients less than 26 years old. Performed at [facility], [address], [place], [place] [address]   Urine Culture     Status: Abnormal   Collection Time: 11/15/21 12:28 PM   Specimen: Urine, Clean Catch  Result Value Ref Range Status   Specimen Description   Final    URINE, CLEAN CATCH Performed at [facility], [address], [place], [place] [address]    Special Requests   Final    NONE Performed at [facility], [address] [place],  [address]    Culture (A)  Final    <10,000 COLONIES/mL INSIGNIFICANT GROWTH Performed at [facility], [place] [address] [place],  [address]    Report Status 11/16/2021 FINAL  Final    Today   Subjective    Adianna Darwin today has no new complaints No fever  Or chills   No Nausea, Vomiting or Diarrhea -- Eating and drinking well -Voiding well          Patient has been seen and examined prior to discharge   Objective   Blood pressure (!) 151/74, pulse 74, temperature 98.8 F (37.1 C), temperature source Oral, resp. rate 14, height '5\' 8"'$  (1.727 m), weight 114 kg, SpO2 98 %.  Intake/Output Summary (Last 24 hours) at 11/19/2021 1533 Last data filed at 11/19/2021 1022 Gross per 24 hour  Intake 690 ml  Output 3275 ml  Net -2585 ml    Exam Gen:- Awake Alert, obese, in no acute distress  HEENT:- [place].AT, No sclera icterus, left IJ central line-removed 11/19/2021 Nose- [place] 2L/min--- removed on 11/18/2021 Neck-Supple Neck,No JVD,.  Lungs-  CTAB , fair air movement bilaterally  CV- S1, S2 normal, RRR Abd-  +ve B.Sounds, Abd Soft, No tenderness, no CVA area tenderness Extremity/Skin:- No  edema,   good pedal pulses  Psych-affect is appropriate, oriented x3 Neuro-bedbound at baseline with lower extremity paraplegia , no new focal deficits, no tremors GU--Foley in situ to be removed on 11/18/2021   Data Review    CBC w Diff:  Lab Results  Component Value Date   WBC 12.5 (H) 11/19/2021   HGB 9.1 (L) 11/19/2021   HCT 29.4 (L) 11/19/2021   PLT 284 11/19/2021   LYMPHOPCT 3 11/15/2021   MONOPCT 6 11/15/2021   EOSPCT 0 11/15/2021   BASOPCT 0 11/15/2021    CMP:  Lab Results  Component Value Date   NA 136 11/19/2021   K 4.7 11/19/2021   CL 113 (H) 11/19/2021   CO2 19 (L) 11/19/2021   BUN 17 11/19/2021   CREATININE 0.34 (L) 11/19/2021   PROT 5.2 (L) 11/18/2021   ALBUMIN 2.0 (L) 11/18/2021   BILITOT 0.6 11/18/2021   ALKPHOS 101 11/18/2021   AST 46 (H) 11/18/2021   ALT 40 11/18/2021  .  Total Discharge time is about 33 minutes  Roxan Hockey M.D on 11/19/2021 at 3:33 PM  Go to [email or web address] -  for contact info  [facility] - Office  [phone]

## 2021-11-19 NOTE — NC FL2 (Signed)
[place] LEVEL OF CARE SCREENING TOOL     IDENTIFICATION  Patient Name: [name] Birthdate: 01-03-1946 Sex: female Admission Date (Current Location): 11/14/2021  [facility] and [place] Number:  [employer] and Address:  [place] [address], [place]      Provider Number: [id number]  Attending Physician Name and Address:  Roxan Hockey, MD  Relative Name and Phone Number:  Carmie End (Daughter)   [phone]    Current Level of Care: Hospital Recommended Level of Care: [place] Prior Approval Number:    Date Approved/Denied:   PASRR Number:    Discharge Plan: SNF    Current Diagnoses: Patient Active Problem List   Diagnosis Date Noted   Acute metabolic encephalopathy 63/87/5643   Pressure injury of skin 11/15/2021   Neurogenic bladder 02/23/2021   Urinary incontinence 02/23/2021   Nephrolithiasis 02/23/2021   Constipation 06/05/2020   Hypokalemia 04/04/2016   Hyperglycemia 04/04/2016   Cellulitis 04/03/2016   Sepsis ([place]) 09/08/2014   Chronic paraplegia ([place]) 09/08/2014   Swelling of right lower extremity 07/21/2014   Discitis of lumbar region    Abscess in epidural space of lumbar spine    Blood poisoning    Osteomyelitis ([place])    Discitis of thoracolumbar region    Anxiety state    Chronic pain syndrome    Sepsis due to Candida species ([place])    Spondylarthrosis    AKI (acute kidney injury) ([place])    Other specified hypothyroidism    Essential hypertension    Paraplegia ([place])    Enteritis due to Clostridium difficile 05/08/2014   UTI (urinary tract infection) 05/08/2014   Chronic indwelling Foley catheter 05/08/2014   Hypotension    Decubitus ulcer of sacral region, unstageable ([place]) 03/28/2014   Hypothyroid 03/28/2014   Depression 03/28/2014   ARF (acute renal failure) ([place]) 03/28/2014   Acute respiratory failure with hypoxia ([place]) 03/27/2014   Pyelonephritis  10/22/2013   Leukocytosis 10/22/2013   Hyponatremia 10/22/2013   Encephalopathy acute 10/22/2013   Hypothyroidism 10/22/2013   Hypertension    Bipolar 1 disorder (HCC)    Thyroid disease    Diabetes mellitus without complication (HCC)    GERD (gastroesophageal reflux disease)    SHOULDER PAIN 11/19/2007   HAND, ARTHRITIS, DEGEN./OSTEO 09/03/2007   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   CARPAL TUNNEL SYNDROME 05/16/2007    Orientation RESPIRATION BLADDER Height & Weight     Self, Time, Situation, Place  Normal Incontinent Weight: 251 lb 5.2 oz (114 kg) Height:  '5\' 8"'$  (172.7 cm)  BEHAVIORAL SYMPTOMS/MOOD NEUROLOGICAL BOWEL NUTRITION STATUS      Incontinent Diet (see d/c summary)  AMBULATORY STATUS COMMUNICATION OF NEEDS Skin   Total Care Verbally PU Stage and Appropriate Care (thigh, left unstagable; buttocks, right, lower deep tissue injury; buttocks left lower unstagable; buttocks right mid stage 4; thigh right unstagable; skin tear vagina, right; skin tear buttock,)                       Personal Care Assistance Level of Assistance  Bathing, Feeding, Dressing, Total care Bathing Assistance: Maximum assistance Feeding assistance: Maximum assistance Dressing Assistance: Maximum assistance Total Care Assistance: Maximum assistance   Functional Limitations Info  Sight, Hearing, Speech Sight Info: Adequate Hearing Info: Adequate Speech Info: Adequate    SPECIAL CARE FACTORS FREQUENCY  Contractures Contractures Info: Not present    Additional Factors Info  Code Status, Allergies, Psychotropic Code Status Info: Full Code Allergies Info: Invanz, propranolol Hcl, rocephin, vancomycin Psychotropic Info: Valium, Desyrel         Current Medications (11/19/2021):  This is the current hospital active medication list Current Facility-Administered Medications  Medication Dose Route Frequency Provider Last Rate Last Admin   0.9 %  sodium chloride infusion    Intravenous PRN Denton Brick, Courage, MD       acetaminophen (TYLENOL) tablet 650 mg  650 mg Oral Q6H PRN Zierle-Ghosh, Asia B, DO   650 mg at 11/19/21 0750   Or   acetaminophen (TYLENOL) suppository 650 mg  650 mg Rectal Q6H PRN Zierle-Ghosh, Asia B, DO       alum & mag hydroxide-simeth (MAALOX/MYLANTA) 200-200-20 MG/5ML suspension 15 mL  15 mL Oral Q6H PRN Zierle-Ghosh, Asia B, DO   15 mL at 11/19/21 0750   aspirin EC tablet 81 mg  81 mg Oral Daily Zierle-Ghosh, Asia B, DO   81 mg at 11/19/21 1018   atorvastatin (LIPITOR) tablet 10 mg  10 mg Oral Daily Zierle-Ghosh, Asia B, DO   10 mg at 11/19/21 1019   Chlorhexidine Gluconate Cloth 2 % PADS 6 each  6 each Topical Daily Barton Dubois, MD   6 each at 11/19/21 1019   doxycycline (VIBRAMYCIN) 100 mg in sodium chloride 0.9 % 250 mL IVPB  100 mg Intravenous Q12H Barton Dubois, MD 125 mL/hr at 11/19/21 1022 100 mg at 11/19/21 1022   ezetimibe (ZETIA) tablet 10 mg  10 mg Oral QHS Zierle-Ghosh, Asia B, DO   10 mg at 11/18/21 2115   gabapentin (NEURONTIN) capsule 300 mg  300 mg Oral BID Barton Dubois, MD   300 mg at 11/19/21 1019   heparin injection 5,000 Units  5,000 Units Subcutaneous Q8H Zierle-Ghosh, Asia B, DO   5,000 Units at 11/19/21 1354   insulin aspart (novoLOG) injection 0-15 Units  0-15 Units Subcutaneous TID WC Zierle-Ghosh, Asia B, DO   2 Units at 11/17/21 1156   ipratropium-albuterol (DUONEB) 0.5-2.5 (3) MG/3ML nebulizer solution 3 mL  3 mL Nebulization Q6H PRN Zierle-Ghosh, Asia B, DO   3 mL at 11/17/21 2323   leptospermum manuka honey (Parker) paste 1 Application  1 Application Topical Daily Barton Dubois, MD   1 Application at 50/53/97 1022   levothyroxine (SYNTHROID) tablet 150 mcg  150 mcg Oral Daily Zierle-Ghosh, Asia B, DO   150 mcg at 11/19/21 0534   midodrine (PROAMATINE) tablet 10 mg  10 mg Oral TID WC Zierle-Ghosh, Asia B, DO   10 mg at 11/19/21 1202   mirabegron ER (MYRBETRIQ) tablet 25 mg  25 mg Oral Daily Zierle-Ghosh, Asia  B, DO   25 mg at 11/19/21 1018   mupirocin ointment (BACTROBAN) 2 %   Nasal BID Barton Dubois, MD   1 Application at 67/34/19 1023   naloxone [facility]) injection 2 mg  2 mg Intravenous PRN Zierle-Ghosh, Asia B, DO   2 mg at 11/14/21 2304   norepinephrine (LEVOPHED) '4mg'$  in 233m (0.016 mg/mL) premix infusion  0-40 mcg/min Intravenous Titrated Zierle-Ghosh, Asia B, DO   Stopped at 11/17/21 1323   ondansetron (ZOFRAN) tablet 4 mg  4 mg Oral Q6H PRN Zierle-Ghosh, Asia B, DO       Or   ondansetron (ZOFRAN) injection 4 mg  4 mg Intravenous Q6H PRN Zierle-Ghosh, Asia B, DO       oxyCODONE (Oxy  IR/ROXICODONE) immediate release tablet 5 mg  5 mg Oral Q4H PRN Zierle-Ghosh, Asia B, DO   5 mg at 11/19/21 0410   pantoprazole (PROTONIX) EC tablet 40 mg  40 mg Oral Daily Zierle-Ghosh, Asia B, DO   40 mg at 11/19/21 1019   polyethylene glycol (MIRALAX / GLYCOLAX) packet 17 g  17 g Oral Daily PRN Zierle-Ghosh, Asia B, DO       tamsulosin (FLOMAX) capsule 0.4 mg  0.4 mg Oral QPC supper Roxan Hockey, MD         Discharge Medications: Please see discharge summary for a list of discharge medications.  Relevant Imaging Results:  Relevant Lab Results:   Additional Information    Levone Otten, Clydene Pugh, LCSW

## 2021-11-19 NOTE — Progress Notes (Signed)
Pt discharged to [facility] for transport to [facility], [place] aware pt is on the way. All lines drains removed, VSS.

## 2021-11-19 NOTE — TOC Transition Note (Signed)
Transition of Care [facility]) - CM/SW Discharge Note   Patient Details  Name: [name] MRN: [id number] Date of Birth: [birth year]  Transition of Care [facility] CM/SW Contact:  Ihor Gully, LCSW Phone Number: 11/19/2021, 3:53 PM   Clinical Narrative:    D/c clinicals sent to facility. RN to call report. EMS to transport. Daughter notified. TOC signing off.    Final next level of care: [place] Barriers to Discharge: No Barriers Identified   Patient Goals and CMS Choice Patient states their goals for this hospitalization and ongoing recovery are:: return to LTC CMS Medicare.gov Compare Post Acute Care list provided to:: Patient Choice offered to / list presented to : Patient  Discharge Placement              Patient chooses bed at: [facility] Patient to be transferred to facility by: [place] Name of family member notified: daughter Patient and family notified of of transfer: 11/19/21  Discharge Plan and Services In-house Referral: Clinical Social Work Discharge Planning Services: CM Consult Post Acute Care Choice: Nursing Home                               Social Determinants of Health (SDOH) Interventions     Readmission Risk Interventions    11/15/2021   12:49 PM  Readmission Risk Prevention Plan  Transportation Screening Complete  HRI or Home Care Consult Complete  Social Work Consult for [place] Planning/Counseling Complete  Palliative Care Screening Not Applicable  Medication Review Press photographer) Complete

## 2021-11-19 NOTE — Progress Notes (Signed)
Patient had blood sugar of 78 and was given juice.

## 2021-11-19 NOTE — Discharge Instructions (Signed)
1) wound care ---apply Medihoney to right and left inner  and outer thigh wounds and left lower buttock Q day, then cover with foam dressing.  Change foam dressings Q 3 days or PRN soiling. 2. Apply moist gauze packing to right middle buttock Q day, using swab to fill, then cover with foam dressing.  (Change foam dressing Q 3 days or PRN soiling.) 2. Foam dressings to right inner groin, left buttock, right lower buttock, right upper thigh.  Change Q 3 days or PRN soiling. Apply thin layer (3 mm) to wound. - Apply Medihoney to right and left inner  and outer thigh wounds  and left lower buttock Q day, then cover with foam dressing.  Change foam dressings Q 3 days or PRN soiling. 2. Apply moist gauze packing to right middle buttock Q day, using swab to fill, then cover with foam dressing.  (Change foam dressing Q 3 days or PRN soiling.) 3. Foam dressings to right inner groin, left buttock, right lower buttock.  Change Q 3 days or PRN soiling.  4)Repeat CBC and BMP blood test on Tuesday, 11/23/2021--

## 2021-11-19 NOTE — Progress Notes (Addendum)
Nurse to Nurse report called and given to April, nurse at [facility]. Discharge summary/instructions and signed by MD prescription placed in discharge envelope and EMS was called by Education officer, museum. Patient aware of discharge and dressed appropriately and is awaiting EMS arrival to transport back to facility.

## 2021-12-22 ENCOUNTER — Other Ambulatory Visit: Payer: Self-pay

## 2021-12-22 ENCOUNTER — Encounter (HOSPITAL_COMMUNITY): Payer: Self-pay

## 2021-12-22 ENCOUNTER — Inpatient Hospital Stay (HOSPITAL_COMMUNITY): Payer: Medicare Other

## 2021-12-22 ENCOUNTER — Emergency Department (HOSPITAL_COMMUNITY): Payer: Medicare Other

## 2021-12-22 ENCOUNTER — Inpatient Hospital Stay (HOSPITAL_COMMUNITY)
Admission: RE | Admit: 2021-12-22 | Discharge: 2021-12-30 | DRG: 871 | Disposition: A | Discharge status: Skilled Nursing Facility | Payer: Medicare Other | Source: Skilled Nursing Facility | Attending: Internal Medicine | Admitting: Internal Medicine

## 2021-12-22 DIAGNOSIS — A419 Sepsis, unspecified organism: Secondary | ICD-10-CM | POA: Diagnosis not present

## 2021-12-22 DIAGNOSIS — Z7982 Long term (current) use of aspirin: Secondary | ICD-10-CM

## 2021-12-22 DIAGNOSIS — N136 Pyonephrosis: Secondary | ICD-10-CM | POA: Diagnosis present

## 2021-12-22 DIAGNOSIS — E876 Hypokalemia: Secondary | ICD-10-CM | POA: Diagnosis present

## 2021-12-22 DIAGNOSIS — E875 Hyperkalemia: Secondary | ICD-10-CM

## 2021-12-22 DIAGNOSIS — Z8249 Family history of ischemic heart disease and other diseases of the circulatory system: Secondary | ICD-10-CM

## 2021-12-22 DIAGNOSIS — R5381 Other malaise: Secondary | ICD-10-CM | POA: Diagnosis present

## 2021-12-22 DIAGNOSIS — Z978 Presence of other specified devices: Secondary | ICD-10-CM

## 2021-12-22 DIAGNOSIS — K7460 Unspecified cirrhosis of liver: Secondary | ICD-10-CM | POA: Diagnosis present

## 2021-12-22 DIAGNOSIS — E1165 Type 2 diabetes mellitus with hyperglycemia: Secondary | ICD-10-CM | POA: Diagnosis present

## 2021-12-22 DIAGNOSIS — D638 Anemia in other chronic diseases classified elsewhere: Secondary | ICD-10-CM | POA: Diagnosis present

## 2021-12-22 DIAGNOSIS — I96 Gangrene, not elsewhere classified: Secondary | ICD-10-CM | POA: Diagnosis present

## 2021-12-22 DIAGNOSIS — E039 Hypothyroidism, unspecified: Secondary | ICD-10-CM | POA: Diagnosis present

## 2021-12-22 DIAGNOSIS — G8220 Paraplegia, unspecified: Secondary | ICD-10-CM | POA: Diagnosis present

## 2021-12-22 DIAGNOSIS — N12 Tubulo-interstitial nephritis, not specified as acute or chronic: Secondary | ICD-10-CM

## 2021-12-22 DIAGNOSIS — N200 Calculus of kidney: Secondary | ICD-10-CM | POA: Diagnosis not present

## 2021-12-22 DIAGNOSIS — J9621 Acute and chronic respiratory failure with hypoxia: Secondary | ICD-10-CM | POA: Diagnosis present

## 2021-12-22 DIAGNOSIS — F319 Bipolar disorder, unspecified: Secondary | ICD-10-CM | POA: Diagnosis present

## 2021-12-22 DIAGNOSIS — N179 Acute kidney failure, unspecified: Secondary | ICD-10-CM

## 2021-12-22 DIAGNOSIS — K6289 Other specified diseases of anus and rectum: Secondary | ICD-10-CM | POA: Diagnosis present

## 2021-12-22 DIAGNOSIS — R4182 Altered mental status, unspecified: Secondary | ICD-10-CM | POA: Diagnosis not present

## 2021-12-22 DIAGNOSIS — Z7401 Bed confinement status: Secondary | ICD-10-CM

## 2021-12-22 DIAGNOSIS — R6521 Severe sepsis with septic shock: Secondary | ICD-10-CM | POA: Diagnosis present

## 2021-12-22 DIAGNOSIS — Z90710 Acquired absence of both cervix and uterus: Secondary | ICD-10-CM

## 2021-12-22 DIAGNOSIS — N1330 Unspecified hydronephrosis: Secondary | ICD-10-CM

## 2021-12-22 DIAGNOSIS — I10 Essential (primary) hypertension: Secondary | ICD-10-CM | POA: Diagnosis present

## 2021-12-22 DIAGNOSIS — Z515 Encounter for palliative care: Secondary | ICD-10-CM | POA: Diagnosis not present

## 2021-12-22 DIAGNOSIS — E8721 Acute metabolic acidosis: Secondary | ICD-10-CM | POA: Diagnosis present

## 2021-12-22 DIAGNOSIS — N131 Hydronephrosis with ureteral stricture, not elsewhere classified: Secondary | ICD-10-CM | POA: Diagnosis not present

## 2021-12-22 DIAGNOSIS — E871 Hypo-osmolality and hyponatremia: Secondary | ICD-10-CM

## 2021-12-22 DIAGNOSIS — E861 Hypovolemia: Secondary | ICD-10-CM | POA: Diagnosis present

## 2021-12-22 DIAGNOSIS — N170 Acute kidney failure with tubular necrosis: Secondary | ICD-10-CM | POA: Diagnosis present

## 2021-12-22 DIAGNOSIS — Z79890 Hormone replacement therapy (postmenopausal): Secondary | ICD-10-CM

## 2021-12-22 DIAGNOSIS — Z20822 Contact with and (suspected) exposure to covid-19: Secondary | ICD-10-CM | POA: Diagnosis present

## 2021-12-22 DIAGNOSIS — K219 Gastro-esophageal reflux disease without esophagitis: Secondary | ICD-10-CM | POA: Diagnosis present

## 2021-12-22 DIAGNOSIS — Z79899 Other long term (current) drug therapy: Secondary | ICD-10-CM

## 2021-12-22 DIAGNOSIS — L89150 Pressure ulcer of sacral region, unstageable: Secondary | ICD-10-CM | POA: Diagnosis present

## 2021-12-22 DIAGNOSIS — Z789 Other specified health status: Secondary | ICD-10-CM | POA: Diagnosis not present

## 2021-12-22 DIAGNOSIS — G9341 Metabolic encephalopathy: Secondary | ICD-10-CM | POA: Diagnosis present

## 2021-12-22 DIAGNOSIS — E860 Dehydration: Secondary | ICD-10-CM | POA: Diagnosis present

## 2021-12-22 DIAGNOSIS — Z87891 Personal history of nicotine dependence: Secondary | ICD-10-CM

## 2021-12-22 DIAGNOSIS — Z881 Allergy status to other antibiotic agents status: Secondary | ICD-10-CM

## 2021-12-22 DIAGNOSIS — K632 Fistula of intestine: Secondary | ICD-10-CM | POA: Diagnosis present

## 2021-12-22 DIAGNOSIS — N319 Neuromuscular dysfunction of bladder, unspecified: Secondary | ICD-10-CM | POA: Diagnosis present

## 2021-12-22 LAB — CBC
HCT: 26.7 % — ABNORMAL LOW (ref 36.0–46.0)
Hemoglobin: 8 g/dL — ABNORMAL LOW (ref 12.0–15.0)
MCH: 26.7 pg (ref 26.0–34.0)
MCHC: 30 g/dL (ref 30.0–36.0)
MCV: 89 fL (ref 80.0–100.0)
Platelets: 420 10*3/uL — ABNORMAL HIGH (ref 150–400)
RBC: 3 MIL/uL — ABNORMAL LOW (ref 3.87–5.11)
RDW: 16.9 % — ABNORMAL HIGH (ref 11.5–15.5)
WBC: 41.8 10*3/uL — ABNORMAL HIGH (ref 4.0–10.5)
nRBC: 0 % (ref 0.0–0.2)

## 2021-12-22 LAB — CBC WITH DIFFERENTIAL/PLATELET
Abs Immature Granulocytes: 0.53 10*3/uL — ABNORMAL HIGH (ref 0.00–0.07)
Basophils Absolute: 0.1 10*3/uL (ref 0.0–0.1)
Basophils Relative: 0 %
Eosinophils Absolute: 0.1 10*3/uL (ref 0.0–0.5)
Eosinophils Relative: 0 %
HCT: 23.8 % — ABNORMAL LOW (ref 36.0–46.0)
Hemoglobin: 7.1 g/dL — ABNORMAL LOW (ref 12.0–15.0)
Immature Granulocytes: 2 %
Lymphocytes Relative: 5 %
Lymphs Abs: 1.9 10*3/uL (ref 0.7–4.0)
MCH: 26.8 pg (ref 26.0–34.0)
MCHC: 29.8 g/dL — ABNORMAL LOW (ref 30.0–36.0)
MCV: 89.8 fL (ref 80.0–100.0)
Monocytes Absolute: 1.9 10*3/uL — ABNORMAL HIGH (ref 0.1–1.0)
Monocytes Relative: 5 %
Neutro Abs: 31.2 10*3/uL — ABNORMAL HIGH (ref 1.7–7.7)
Neutrophils Relative %: 88 %
Platelets: 470 10*3/uL — ABNORMAL HIGH (ref 150–400)
RBC: 2.65 MIL/uL — ABNORMAL LOW (ref 3.87–5.11)
RDW: 17.2 % — ABNORMAL HIGH (ref 11.5–15.5)
WBC: 35.7 10*3/uL — ABNORMAL HIGH (ref 4.0–10.5)
nRBC: 0 % (ref 0.0–0.2)

## 2021-12-22 LAB — BASIC METABOLIC PANEL
Anion gap: 9 (ref 5–15)
BUN: 72 mg/dL — ABNORMAL HIGH (ref 8–23)
CO2: 16 mmol/L — ABNORMAL LOW (ref 22–32)
Calcium: 8.1 mg/dL — ABNORMAL LOW (ref 8.9–10.3)
Chloride: 107 mmol/L (ref 98–111)
Creatinine, Ser: 1.67 mg/dL — ABNORMAL HIGH (ref 0.44–1.00)
GFR, Estimated: 32 mL/min — ABNORMAL LOW (ref >60)
Glucose, Bld: 110 mg/dL — ABNORMAL HIGH (ref 70–99)
Potassium: 4.9 mmol/L (ref 3.5–5.1)
Sodium: 132 mmol/L — ABNORMAL LOW (ref 135–145)

## 2021-12-22 LAB — TROPONIN I (HIGH SENSITIVITY)
Troponin I (High Sensitivity): 5 ng/L (ref <18)
Troponin I (High Sensitivity): 5 ng/L (ref <18)

## 2021-12-22 LAB — COMPREHENSIVE METABOLIC PANEL
ALT: 21 U/L (ref 0–44)
AST: 29 U/L (ref 15–41)
Albumin: 2.2 g/dL — ABNORMAL LOW (ref 3.5–5.0)
Alkaline Phosphatase: 146 U/L — ABNORMAL HIGH (ref 38–126)
Anion gap: 13 (ref 5–15)
BUN: 83 mg/dL — ABNORMAL HIGH (ref 8–23)
CO2: 12 mmol/L — ABNORMAL LOW (ref 22–32)
Calcium: 8.2 mg/dL — ABNORMAL LOW (ref 8.9–10.3)
Chloride: 102 mmol/L (ref 98–111)
Creatinine, Ser: 1.72 mg/dL — ABNORMAL HIGH (ref 0.44–1.00)
GFR, Estimated: 31 mL/min — ABNORMAL LOW (ref >60)
Glucose, Bld: 80 mg/dL (ref 70–99)
Potassium: 5.9 mmol/L — ABNORMAL HIGH (ref 3.5–5.1)
Sodium: 127 mmol/L — ABNORMAL LOW (ref 135–145)
Total Bilirubin: 1 mg/dL (ref 0.3–1.2)
Total Protein: 6.8 g/dL (ref 6.5–8.1)

## 2021-12-22 LAB — PROCALCITONIN: Procalcitonin: 0.84 ng/mL

## 2021-12-22 LAB — BRAIN NATRIURETIC PEPTIDE: B Natriuretic Peptide: 38 pg/mL (ref 0.0–100.0)

## 2021-12-22 LAB — MRSA NEXT GEN BY PCR, NASAL: MRSA by PCR Next Gen: NOT DETECTED

## 2021-12-22 LAB — CBG MONITORING, ED
Glucose-Capillary: 78 mg/dL (ref 70–99)
Glucose-Capillary: 93 mg/dL (ref 70–99)

## 2021-12-22 LAB — TSH: TSH: 3.775 u[IU]/mL (ref 0.350–4.500)

## 2021-12-22 LAB — MAGNESIUM
Magnesium: 2.8 mg/dL — ABNORMAL HIGH (ref 1.7–2.4)
Magnesium: 2.5 mg/dL — ABNORMAL HIGH (ref 1.7–2.4)

## 2021-12-22 LAB — GLUCOSE, CAPILLARY
Glucose-Capillary: 118 mg/dL — ABNORMAL HIGH (ref 70–99)
Glucose-Capillary: 93 mg/dL (ref 70–99)

## 2021-12-22 LAB — LACTIC ACID, PLASMA: Lactic Acid, Venous: 1.1 mmol/L (ref 0.5–1.9)

## 2021-12-22 LAB — SARS CORONAVIRUS 2 BY RT PCR: SARS Coronavirus 2 by RT PCR: NEGATIVE

## 2021-12-22 LAB — AMMONIA: Ammonia: 14 umol/L (ref 9–35)

## 2021-12-22 MED ORDER — SODIUM CHLORIDE 0.9 % IV BOLUS
1000.0000 mL | Freq: Once | INTRAVENOUS | Status: AC
  Administered 2021-12-22: 1000 mL via INTRAVENOUS

## 2021-12-22 MED ORDER — SODIUM CHLORIDE 0.9 % IV SOLN
1.0000 g | Freq: Two times a day (BID) | INTRAVENOUS | Status: DC
  Administered 2021-12-23 – 2021-12-24 (×3): 1 g via INTRAVENOUS
  Filled 2021-12-22 (×3): qty 20

## 2021-12-22 MED ORDER — LEVOTHYROXINE SODIUM 75 MCG PO TABS
175.0000 ug | ORAL_TABLET | Freq: Every day | ORAL | Status: DC
  Administered 2021-12-23 – 2021-12-29 (×7): 175 ug via ORAL
  Filled 2021-12-22 (×7): qty 1

## 2021-12-22 MED ORDER — SODIUM ZIRCONIUM CYCLOSILICATE 10 G PO PACK: 10.0000 g | PACK | Freq: Once | ORAL | Status: DC

## 2021-12-22 MED ORDER — ATORVASTATIN CALCIUM 10 MG PO TABS
10.0000 mg | ORAL_TABLET | Freq: Every day | ORAL | Status: DC
  Administered 2021-12-23 – 2021-12-29 (×7): 10 mg via ORAL
  Filled 2021-12-22 (×7): qty 1

## 2021-12-22 MED ORDER — SODIUM CHLORIDE 0.9 % IV SOLN
1.0000 g | Freq: Once | INTRAVENOUS | Status: AC
  Administered 2021-12-22: 1 g via INTRAVENOUS
  Filled 2021-12-22: qty 20

## 2021-12-22 MED ORDER — PIPERACILLIN-TAZOBACTAM 3.375 G IVPB: 3.3750 g | Freq: Three times a day (TID) | INTRAVENOUS | Status: DC

## 2021-12-22 MED ORDER — CHLORHEXIDINE GLUCONATE CLOTH 2 % EX PADS
6.0000 | MEDICATED_PAD | Freq: Every day | CUTANEOUS | Status: DC
  Administered 2021-12-22 – 2021-12-29 (×8): 6 via TOPICAL

## 2021-12-22 MED ORDER — NOREPINEPHRINE 4 MG/250ML-% IV SOLN
2.0000 ug/min | INTRAVENOUS | Status: DC
  Administered 2021-12-22: 8 ug/min via INTRAVENOUS
  Administered 2021-12-22 – 2021-12-23 (×2): 10 ug/min via INTRAVENOUS
  Filled 2021-12-22 (×2): qty 250

## 2021-12-22 MED ORDER — ASPIRIN 81 MG PO CHEW
81.0000 mg | CHEWABLE_TABLET | Freq: Every day | ORAL | Status: DC
  Administered 2021-12-23 – 2021-12-29 (×6): 81 mg via ORAL
  Filled 2021-12-22 (×7): qty 1

## 2021-12-22 MED ORDER — ALBUMIN HUMAN 25 % IV SOLN
25.0000 g | Freq: Once | INTRAVENOUS | Status: AC
  Administered 2021-12-22: 25 g via INTRAVENOUS
  Filled 2021-12-22: qty 100

## 2021-12-22 MED ORDER — POLYETHYLENE GLYCOL 3350 17 G PO PACK: 17.0000 g | PACK | Freq: Every day | ORAL | Status: DC | PRN

## 2021-12-22 MED ORDER — LACTATED RINGERS IV BOLUS: 1000.0000 mL | Freq: Once | INTRAVENOUS | Status: DC

## 2021-12-22 MED ORDER — DOCUSATE SODIUM 100 MG PO CAPS: 100.0000 mg | ORAL_CAPSULE | Freq: Two times a day (BID) | ORAL | Status: DC | PRN

## 2021-12-22 MED ORDER — NOREPINEPHRINE 4 MG/250ML-% IV SOLN
INTRAVENOUS | Status: AC
  Administered 2021-12-22: 2 ug/min via INTRAVENOUS
  Filled 2021-12-22: qty 250

## 2021-12-22 MED ORDER — SODIUM CHLORIDE 0.9 % IV SOLN
250.0000 mL | INTRAVENOUS | Status: DC
  Administered 2021-12-22: 250 mL via INTRAVENOUS

## 2021-12-22 MED ORDER — LACTATED RINGERS IV SOLN: INTRAVENOUS | Status: DC

## 2021-12-22 MED ORDER — LIDOCAINE HCL URETHRAL/MUCOSAL 2 % EX GEL
1.0000 | Freq: Once | CUTANEOUS | Status: DC
  Filled 2021-12-22: qty 6

## 2021-12-22 MED ORDER — DEXTROSE 50 % IV SOLN: 50.0000 mL | Freq: Once | INTRAVENOUS | Status: DC

## 2021-12-22 MED ORDER — NOREPINEPHRINE 4 MG/250ML-% IV SOLN: 0.0000 ug/min | INTRAVENOUS | Status: DC

## 2021-12-22 MED ORDER — HEPARIN SODIUM (PORCINE) 5000 UNIT/ML IJ SOLN
5000.0000 [IU] | Freq: Three times a day (TID) | INTRAMUSCULAR | Status: DC
  Administered 2021-12-22 – 2021-12-29 (×18): 5000 [IU] via SUBCUTANEOUS
  Filled 2021-12-22 (×21): qty 1

## 2021-12-22 MED ORDER — PIPERACILLIN-TAZOBACTAM 3.375 G IVPB: 3.3750 g | Freq: Four times a day (QID) | INTRAVENOUS | Status: DC

## 2021-12-22 MED ORDER — INSULIN ASPART 100 UNIT/ML IJ SOLN: 10.0000 [IU] | Freq: Once | INTRAMUSCULAR | Status: DC

## 2021-12-22 NOTE — ED Provider Notes (Signed)
[facility] EMERGENCY DEPARTMENT Provider Note   CSN: [id number] Arrival date & time: 12/22/21  1243     History  Chief Complaint  Patient presents with   Altered Mental Status    KARALYNN COTTONE is a 76 y.o. female with paraplegia, neurogenic bladder, COPD, T2DM, history of metabolic encephalopathy, history of swelling of right lower extremity, lumbar epidural abscess, hypothyroidism, bipolar 1 disorder, GERD, HTN, decubitus ulcer unstageable, presents from [facility] with altered mental status  Per triage note, patient has been more fatigued, not taking good p.o., and "possible UTI."  Blood glucose 90 per EMS.  Helen for collateral information. Could not reach anyone who had any information. Will try again later.   Patient is A&Ox2, not to place or situation. States that she was brought here because someone told her she didn't know where she was. She endorses mild chest pain but denies f/c, palpitations, SOB, abd pain, N/V.   Per chart review, patient was recently admitted from 11/14/2021 to 11/19/2021 for gram-negative urosepsis.  Presented at that time for unresponsiveness.   Altered Mental Status      Home Medications Prior to Admission medications   Medication Sig Start Date End Date Taking? Authorizing Provider  acetaminophen (TYLENOL) 325 MG tablet Take 325 mg by mouth 2 (two) times daily. *May take one every 6 hours as needed for pain    [provider]  allopurinol (ZYLOPRIM) 100 MG tablet Take 100 mg by mouth daily.  10/14/14   [provider]  aspirin EC 81 MG tablet Take 81 mg by mouth daily.    [provider]  atorvastatin (LIPITOR) 10 MG tablet Take 10 mg by mouth daily.    [provider]  azelastine (ASTELIN) 0.1 % nasal spray Place 2 sprays into both nostrils 2 (two) times daily. Use in each nostril as directed    [provider]  b complex vitamins tablet Take 1 tablet by mouth daily.    [provider]  buPROPion (WELLBUTRIN XL) 300 MG 24 hr tablet Take 300 mg by mouth daily.     [provider]  Calcium Carb-Cholecalciferol (CALCIUM CARBONATE-VITAMIN D3 PO) Take 1 tablet by mouth in the morning and at bedtime.    [provider]  carvedilol (COREG) 12.5 MG tablet Take 12.5 mg by mouth 2 (two) times daily with a meal.    [provider]  Cranberry 500 MG TABS Take 1 capsule by mouth daily.    [provider]  dextromethorphan-guaiFENesin (MUCINEX DM) 30-600 MG per 12 hr tablet Take 1 tablet by mouth every 12 (twelve) hours as needed for cough.     [provider]  docusate sodium (COLACE) 100 MG capsule Take 100 mg by mouth at bedtime.    [provider]  ezetimibe (ZETIA) 10 MG tablet Take 10 mg by mouth at bedtime.     [provider]  famotidine (PEPCID) 20 MG tablet Take 20 mg by mouth 2 (two) times daily.    [provider]  fluticasone (FLONASE) 50 MCG/ACT nasal spray Place 2 sprays into both nostrils daily.  09/03/14   [provider]  furosemide (LASIX) 20 MG tablet Take 1 tablet (20 mg total) by mouth daily. 11/19/21   Roxan Hockey, MD  gabapentin (NEURONTIN) 300 MG capsule Take 300 mg by mouth every 8 (eight) hours.    [provider]  ipratropium-albuterol (DUONEB) 0.5-2.5 (3) MG/3ML SOLN Take 3 mLs by nebulization every  6 (six) hours as needed (shortness of breath). 11/19/21   Roxan Hockey, MD  Javier Docker Oil 300 MG CAPS Take 1 capsule by mouth 2 (two) times daily.    [provider]  Lactobacillus (ACIDOPHILUS PO) Take 1 tablet by mouth in the morning and at bedtime.    [provider]  leptospermum manuka honey (MEDIHONEY) PSTE paste Apply 1 Application topically daily. Apply Medihoney to right and left inner  and outer thigh wounds and left lower buttock Q day, then cover with foam dressing.  Change foam dressings Q 3 days or PRN soiling. 2. Apply moist gauze  packing to right middle buttock Q day, using swab to fill, then cover with foam dressing.  (Change foam dressing Q 3 days or PRN soiling.) 2. Foam dressings to right inner groin, left buttock, right lower buttock, right upper thigh.  Change Q 3 days or PRN soiling. Apply thin layer (3 mm) to wound. 11/20/21   Roxan Hockey, MD  levothyroxine (SYNTHROID) 150 MCG tablet Take 175 mcg by mouth daily.    [provider]  lisinopril (ZESTRIL) 2.5 MG tablet Take 2.5 mg by mouth daily.    [provider]  magnesium oxide (MAG-OX) 400 MG tablet Take 400 mg by mouth daily.    [provider]  Menthol-Zinc Oxide (CALMOSEPTINE) 0.44-20.6 % OINT Apply 1 Application topically every 8 (eight) hours as needed (protection/prevention).    [provider]  mirabegron ER (MYRBETRIQ) 25 MG TB24 tablet Take 25 mg by mouth daily.    [provider]  Multiple Vitamins-Minerals (CERTAGEN PO) Take 1 tablet by mouth daily.    [provider]  oxycodone (OXY-IR) 5 MG capsule Take 1 capsule (5 mg total) by mouth in the morning and at bedtime. 11/19/21   Roxan Hockey, MD  pantoprazole (PROTONIX) 40 MG tablet Take 1 tablet (40 mg total) by mouth daily. 11/20/21   Roxan Hockey, MD  potassium chloride 20 MEQ TBCR Take 10 mEq by mouth daily. 04/06/16   Kathie Dike, MD  tamsulosin (FLOMAX) 0.4 MG CAPS capsule Take 1 capsule (0.4 mg total) by mouth daily after supper. 11/19/21   Roxan Hockey, MD  topiramate (TOPAMAX) 50 MG tablet Take 50 mg by mouth 2 (two) times daily.    [provider]  vitamin C (ASCORBIC ACID) 500 MG tablet Take 500 mg by mouth 2 (two) times daily.    [provider]  Vitamin D, Ergocalciferol, (DRISDOL) 1.25 MG (50000 UNIT) CAPS capsule Take 50,000 Units by mouth every 7 (seven) days.    [provider]  Wound Dressings (PROMOGRAN EX) Apply topically daily.    [provider]      Allergies    Invanz  [ertapenem], Propranolol hcl, Rocephin [ceftriaxone], and Vancomycin    Review of Systems   Review of Systems  Unable to perform ROS: Mental status change    Physical Exam Updated Vital Signs BP 129/79   Pulse 91   Temp 98.1 F (36.7 C) (Oral)   Resp 18   SpO2 97%  Physical Exam General: Chronically ill-appearing, pale female, lying in bed. Smells of urine. HEENT: PERRLA, Sclera anicteric, MMM, trachea midline. Cardiology: RRR, no murmurs/rubs/gallops. BL radial and DP pulses equal bilaterally.  Resp: Normal respiratory rate and effort. CTAB, no wheezes, rhonchi, crackles.  Abd: Obese. Soft, non-tender, non-distended. No rebound tenderness or guarding.  GU: Deferred. MSK: BL LE nonpitting edema 1+, R>L (present in history). No erythema, induration/fluctuance, or signs of trauma. Extremities  without deformity or TTP. No cyanosis or clubbing. Skin: warm, dry. No rashes or lesions. Back: No CVA tenderness Neuro: A&Ox4, CNs II-XII grossly intact. 0/5 strength in BL LEs. 5/5 strength in BL UEs.  Sensation grossly intact.  Psych: Normal mood and affect.   ED Results / Procedures / Treatments   Labs (all labs ordered are listed, but only abnormal results are displayed) Labs Reviewed  CBC WITH DIFFERENTIAL/PLATELET - Abnormal; Notable for the following components:      Result Value   WBC 35.7 (*)    RBC 2.65 (*)    Hemoglobin 7.1 (*)    HCT 23.8 (*)    MCHC 29.8 (*)    RDW 17.2 (*)    Platelets 470 (*)    Neutro Abs 31.2 (*)    Monocytes Absolute 1.9 (*)    Abs Immature Granulocytes 0.53 (*)    All other components within normal limits  COMPREHENSIVE METABOLIC PANEL - Abnormal; Notable for the following components:   Sodium 127 (*)    Potassium 5.9 (*)    CO2 12 (*)    BUN 83 (*)    Creatinine, Ser 1.72 (*)    Calcium 8.2 (*)    Albumin 2.2 (*)    Alkaline Phosphatase 146 (*)    GFR, Estimated 31 (*)    All other components within normal limits  MAGNESIUM - Abnormal;  Notable for the following components:   Magnesium 2.8 (*)    All other components within normal limits  SARS CORONAVIRUS 2 BY RT PCR  URINE CULTURE  CULTURE, BLOOD (ROUTINE X 2)  CULTURE, BLOOD (ROUTINE X 2)  AMMONIA  LACTIC ACID, PLASMA  URINALYSIS, ROUTINE W REFLEX MICROSCOPIC  ETHANOL  CBC WITH DIFFERENTIAL/PLATELET  BRAIN NATRIURETIC PEPTIDE  CBG MONITORING, ED  CBG MONITORING, ED  TROPONIN I (HIGH SENSITIVITY)    EKG EKG Interpretation  Date/Time:  Wednesday December 22 2021 13:51:08 EDT Ventricular Rate:  90 PR Interval:  219 QRS Duration: 100 QT Interval:  352 QTC Calculation: 431 R Axis:   33 Text Interpretation: Normal sinus rhythm. review Borderline prolonged PR interval Low voltage, extremity and precordial leads Confirmed by Cindee Lame [phone]) on 12/22/2021 3:42:06 PM.   Radiology DG Chest Port 1 View  Result Date: 12/22/2021 CLINICAL DATA:  Altered mental status and cough. EXAM: PORTABLE CHEST 1 VIEW COMPARISON:  Chest radiograph 11/15/2021 FINDINGS: Patient appears to be slightly rotated towards the right. Heart and mediastinum are within normal limits and stable. Slightly coarse lung markings are suggestive for chronic changes. No focal airspace disease or pulmonary edema. Mild widening at the bilateral AC joints. Negative for a pneumothorax. IMPRESSION: No acute chest findings. Electronically Signed   By: Markus Daft M.D.   On: 12/22/2021 13:29    Procedures .Critical Care  Performed by: Audley Hose, MD Authorized by: Audley Hose, MD   Critical care provider statement:    Critical care time (minutes):  50   Critical care was necessary to treat or prevent imminent or life-threatening deterioration of the following conditions:  Renal failure, sepsis, shock, metabolic crisis and dehydration   Critical care was time spent personally by me on the following activities:  Development of treatment plan with patient or surrogate, discussions with  consultants, evaluation of patient's response to treatment, examination of patient, ordering and review of laboratory studies, ordering and review of radiographic studies, ordering and performing treatments and interventions, pulse oximetry, re-evaluation of patient's condition, review of old charts and  obtaining history from patient or surrogate   Care discussed with: admitting provider       Medications Ordered in ED Medications  norepinephrine (LEVOPHED) '4mg'$  in 243m (0.016 mg/mL) premix infusion (2 mcg/min Intravenous New Bag/Given 12/22/21 1515)  piperacillin-tazobactam (ZOSYN) IVPB 3.375 g (has no administration in time range)  sodium chloride 0.9 % bolus 1,000 mL (has no administration in time range)  sodium chloride 0.9 % bolus 1,000 mL (0 mLs Intravenous Stopped 12/22/21 1453)  sodium chloride 0.9 % bolus 1,000 mL (1,000 mLs Intravenous Bolus 12/22/21 1443)    ED Course/ Medical Decision Making/ A&P                          Medical Decision Making Amount and/or Complexity of Data Reviewed Labs: ordered. Decision-making details documented in ED Course. Radiology: ordered.  Risk Prescription drug management. Decision regarding hospitalization.    Minimal history for this patient lends itself to a broad differential. Patient recently admitted for urosepsis presenting with unresponsiveness so today presenting w/ AMS will evaluate for possible UTI. While in ED, patient w/ tachycardia and fever, developed hypotension. Greatest concern for septic shock, though also consider hypovolemic shock or cardiogenic shock. Blood cultures drawn.   Ddx of acute altered mental status or encephalopathy considered but not limited to: -Intracranial abnormalities such as ICH, hydrocephalus, head trauma - no report of head trauma, patient is NCAT with no focal neuro deficits -Infection such as UTI, PNA, or meningitis - patient with recent urosepsis, will obtain urine as this is most likely source but  will reevaluate if negative -Toxic ingestion such as opioid overdose, anticholinergic toxicity - no reported ingestion  -Electrolyte abnormalities or hyper/hypoglycemia -Hypercarbia or hypoxia -Hepatic encephalopathy or uremia -ACS or arrhythmia - EKG w/ NSR    I have personally reviewed and interpreted all labs and imaging, as listed below.   Clinical Course as of 12/22/21 1551  Wed Dec 22, 2021  1420 Glucose-Capillary: 78 [HN]  1420 BP(!): 76/45 L fluid hung [HN]  1428 RN started fluid. Echocardiogram from 8/23 shows EF 65-70%. Will start a 2nd liter of fluid and obtain blood cultures. C/f septic shock vs hypovolemic shock. No CP or SOB to indicate cardiogenic or obstructive shock though patient is paraplegic.  [HN]  1504 Patient finishing 2nd liter of fluid and persistently hypotensive with MAPs in the 50s. Will start levophed gtt.  [HN]  1506 Labs demonstrate WBC 35.7, Hgb 7.1 dropped from 9.1, Na 127, K 5.9, CO2 12, Cr. 1.72 up from 0.3, BUN 83. Lactate 1.1. troponin initial 5. Allergy to rocephin hives documented but it appears as though she has tolerated zosyn in the past. Will start Zosyn IV after discussion with pharmacy and hang levophed. Consulted to intensivist. [HN]  1535 [facility]. In the meantime, will give another L of fluid and if she weans off of norepinephrine, we can cancel the transfer. Will be attempting to clear an ICU bed. [HN]  [id number]/w pharmacy, who recommended meropenem instead of zosyn d/t potential allergy. Meropenem consult to pharmacy placed. [HN]    Clinical Course User Index [HN] NAudley Hose MD    Patient is signed out to the oncoming ED physician who is made aware of her history, presentation, exam, workup, and plan.  Plan is to give patient additional fluid resuscitation and [facility]         Final Clinical  Impression(s) / ED Diagnoses Final diagnoses:  Altered  mental status, unspecified altered mental status type  Hyperkalemia  Hyponatremia  Sepsis with acute renal failure and septic shock, due to unspecified organism, unspecified acute renal failure type [facility])    Rx / DC Orders ED Discharge Orders     None        This note was created using dictation software, which may contain spelling or grammatical errors.    Audley Hose, MD 12/27/21 2141

## 2021-12-22 NOTE — H&P (Signed)
NAME:  [name], MRN:  [id number], DOB:  [birth year], LOS: 0 ADMISSION DATE:  12/22/2021, CONSULTATION DATE:  9/27   REFERRING MD:  Mayra Neer, CHIEF COMPLAINT:  AMS   History of Present Illness:  [name], is a 76 y.o. female, who presented to the [facility] from [facility] with a chief complaint of AMS  They have a pertinent past medical history of paraplegic spinal paralysis, COPD, DM 2, GERD, hypertension, obesity, hypothyroidism, unstageable decubitus ulcer.  Per notes prior to arrival, patient with recent decline in eating habits, sleeping more, possible UTI. Recent admission to [facility] on 8/20-8/25 with urosepsis secondary to gram negative UTI.   ED course was notable for WBC of 35.7, blood pressure of 76/45. Patient with AKI and K 5.9, A code sepsis was called. 3L IVF was given. BC and UC were obtained. Meropenem was started.   PCCM was consulted for admission.  Upon arrival to [facility], pt was on 7 Levophed; however, IV noted to not be functioning appropriately. SBP in 120's so Levophed turned off while awaiting new IV.  Pertinent  Medical History  paraplegic spinal paralysis, COPD, DM2, , GERD, hypertension, obesity, hypothyroidism, unstageable decubitus ulcer.  Significant Hospital Events: Including procedures, antibiotic start and stop dates in addition to other pertinent events   9/27 Presented to [facility], meropenem, PCCM consult   Interim History / Subjective:  Comfortable. No distress. SBP 110.  Objective   Blood pressure 102/60, pulse 91, temperature 97.9 F (36.6 C), temperature source Oral, resp. rate 17, height '5\' 7"'$  (1.702 m), weight 97.3 kg, SpO2 99 %.        Intake/Output Summary (Last 24 hours) at 12/22/2021 1952 Last data filed at 12/22/2021 1811 Gross per 24 hour  Intake 34.66 ml  Output --  Net 34.66 ml   Filed Weights   12/22/21 1918  Weight: 97.3 kg    Examination: General: Adult female, chronically ill appearing, resting in bed, in NAD. Neuro:  Awake, alert to self and place, follows basic commands. HEENT: [place]/AT. Sclerae anicteric. EOMI.  MM dry. Cardiovascular: RRR, no M/R/G.  Lungs: Respirations even and unlabored.  CTA bilaterally, No W/R/R. Abdomen: BS x 4, soft, NT/ND.  Musculoskeletal: No gross deformities, 1+ edema.  Skin: Unstageable sacral decub ulcer per RN. Skin otherwise warm, no rashes.  Assessment & Plan:   Septic Shock - presumed secondary to urosepsis vs wound infection. History of SE bacteremia on 11/19/21 admission. Hx HTN. - Levophed currently on hold, continue if needed for goal MAP > 65. - Albumin 25g now. - MIVF at 75/hr. - Continue Meropenem (allergy to vanc, ceftriaxone, invanz noted). - Follow cultures. - Obtain procalcitonin, MRSA PCR. - Defer repeat echo now as just had one 8/23 (EF 65-70%, G2DD). - Hold home antihypertensives.  Acute respiratory failure with hypoxia. COPD. - Continue supplemental O2 for goal SPO2 >88%. - Bronchial hygiene.  AKI Suspect secondary to hypotension/septic ATN. Creat 1.72 (baseline 0.3-0.7), BUN 83. Hyponatremia - presumed hypovolemic. Hyperkalemia. - Continue gentle fluids. - Place foley, strict I/O's. - 10g Lokelma, 1 amp D50, 10u insulin. - Follow BMP.  Hx DM2 - SSI.  Hx Hypothyroidism. - Check TSH. - Continue synthroid.  Hx GERD. - Continue PPI.  HX paraplegic spinal paralysis. -PT/OT.  Unstageable decubitus ulcer (POA). - AM WOC consult. - Placing foley to avoid contamination etc.  Best Practice (right click and "Reselect all SmartList Selections" daily)   Diet/type: Regular consistency (see orders) DVT prophylaxis: prophylactic heparin  GI prophylaxis: PPI Lines: N/A Foley:  Yes, and it is still needed Code Status:  full code Last date of multidisciplinary goals of care discussion: None yet.  Labs   CBC: Recent Labs  Lab 12/22/21 1342  WBC 35.7*  NEUTROABS 31.2*  HGB 7.1*  HCT 23.8*  MCV 89.8  PLT 470*    Basic Metabolic  Panel: Recent Labs  Lab 12/22/21 1342  NA 127*  K 5.9*  CL 102  CO2 12*  GLUCOSE 80  BUN 83*  CREATININE 1.72*  CALCIUM 8.2*  MG 2.8*   GFR: Estimated Creatinine Clearance: 34.9 mL/min (A) (by C-G formula based on SCr of 1.72 mg/dL (H)). Recent Labs  Lab 12/22/21 1342  WBC 35.7*  LATICACIDVEN 1.1    Liver Function Tests: Recent Labs  Lab 12/22/21 1342  AST 29  ALT 21  ALKPHOS 146*  BILITOT 1.0  PROT 6.8  ALBUMIN 2.2*   No results for input(s): "LIPASE", "AMYLASE" in the last 168 hours. Recent Labs  Lab 12/22/21 1342  AMMONIA 14    ABG    Component Value Date/Time   PHART 7.469 (H) 03/29/2014 0410   PCO2ART 29.5 (L) 03/29/2014 0410   PO2ART 112.0 (H) 03/29/2014 0410   HCO3 14.7 (L) 11/14/2021 2132   TCO2 17 05/07/2014 1559   ACIDBASEDEF 11.9 (H) 11/14/2021 2132   O2SAT 49.6 11/14/2021 2132     Coagulation Profile: No results for input(s): "INR", "PROTIME" in the last 168 hours.  Cardiac Enzymes: No results for input(s): "CKTOTAL", "CKMB", "CKMBINDEX", "TROPONINI" in the last 168 hours.  HbA1C: Hgb A1c MFr Bld  Date/Time Value Ref Range Status  11/15/2021 03:53 AM 6.0 (H) 4.8 - 5.6 % Final    Comment:    (NOTE)         Prediabetes: 5.7 - 6.4         Diabetes: >6.4         Glycemic control for adults with diabetes: <7.0   04/04/2016 05:52 AM 7.1 (H) 4.8 - 5.6 % Final    Comment:    (NOTE)         Pre-diabetes: 5.7 - 6.4         Diabetes: >6.4         Glycemic control for adults with diabetes: <7.0     CBG: Recent Labs  Lab 12/22/21 1354 12/22/21 1814 12/22/21 1913  GLUCAP 78 93 93    Review of Systems:   All negative; except for those that are bolded, which indicate positives.  Constitutional: weight loss, weight gain, night sweats, fevers, chills, fatigue, weakness.  HEENT: headaches, sore throat, sneezing, nasal congestion, post nasal drip, difficulty swallowing, tooth/dental problems, visual complaints, visual changes, ear  aches. Neuro: difficulty with speech, weakness, numbness, ataxia, confusion. CV:  chest pain, orthopnea, PND, swelling in lower extremities, dizziness, palpitations, syncope.  Resp: cough, hemoptysis, dyspnea, wheezing. GI: heartburn, indigestion, abdominal pain, nausea, vomiting, diarrhea, constipation, change in bowel habits, loss of appetite, hematemesis, melena, hematochezia.  GU: dysuria, change in color of urine, urgency or frequency, flank pain, hematuria. MSK: joint pain or swelling, decreased range of motion. Psych: change in mood or affect, depression, anxiety, suicidal ideations, homicidal ideations. Skin: rash, itching, bruising.   Past Medical History:  She,  has a past medical history of Bipolar 1 disorder ([place]), COPD with asthma ([place]), Diabetes mellitus without complication ([place]), GERD (gastroesophageal reflux disease), Hypertension, Obesity, Paraplegic spinal paralysis ([place]), Pyelonephritis, and Thyroid disease.   Surgical History:  Past Surgical History:  Procedure Laterality Date   ABDOMINAL HYSTERECTOMY     BACK SURGERY       Social History:   reports that she has quit smoking. She has never used smokeless tobacco. She reports that she does not currently use alcohol. She reports that she does not use drugs.   Family History:  Her family history includes Heart disease in her mother. She was adopted.   Allergies Allergies  Allergen Reactions   Invanz [Ertapenem] Other (See Comments)    Patient states allergy but is not certain    Propranolol Hcl    Rocephin [Ceftriaxone] Hives   Vancomycin Hives     Home Medications  Prior to Admission medications   Medication Sig Start Date End Date Taking? Authorizing Provider  acetaminophen (TYLENOL) 325 MG tablet Take 325 mg by mouth 2 (two) times daily. *May take one every 6 hours as needed for pain    [provider]  allopurinol (ZYLOPRIM) 100 MG tablet Take 100 mg by mouth daily.  10/14/14   [provider]  aspirin EC 81 MG tablet Take 81 mg by mouth daily.    [provider]  atorvastatin (LIPITOR) 10 MG tablet Take 10 mg by mouth daily.    [provider]  azelastine (ASTELIN) 0.1 % nasal spray Place 2 sprays into both nostrils 2 (two) times daily. Use in each nostril as directed    [provider]  b complex vitamins tablet Take 1 tablet by mouth daily.    [provider]  buPROPion (WELLBUTRIN XL) 300 MG 24 hr tablet Take 300 mg by mouth daily.     [provider]  Calcium Carb-Cholecalciferol (CALCIUM CARBONATE-VITAMIN D3 PO) Take 1 tablet by mouth in the morning and at bedtime.    [provider]  carvedilol (COREG) 12.5 MG tablet Take 12.5 mg by mouth 2 (two) times daily with a meal.    [provider]  Cranberry 500 MG TABS Take 1 capsule by mouth daily.    [provider]  dextromethorphan-guaiFENesin (MUCINEX DM) 30-600 MG per 12 hr tablet Take 1 tablet by mouth every 12 (twelve) hours as needed for cough.     [provider]  docusate sodium (COLACE) 100 MG capsule Take 100 mg by mouth at bedtime.    [provider]  ezetimibe (ZETIA) 10 MG tablet Take 10 mg by mouth at bedtime.     [provider]  famotidine (PEPCID) 20 MG tablet Take 20 mg by mouth 2 (two) times daily.    [provider]  fluticasone (FLONASE) 50 MCG/ACT nasal spray Place 2 sprays into both nostrils daily.  09/03/14   [provider]  furosemide (LASIX) 20 MG tablet Take 1 tablet (20 mg total) by mouth daily. 11/19/21   Roxan Hockey, MD  gabapentin (NEURONTIN) 300 MG capsule Take 300 mg by mouth every 8 (eight) hours.    [provider]  ipratropium-albuterol (DUONEB) 0.5-2.5 (3) MG/3ML SOLN Take 3 mLs by nebulization every 6 (six) hours as needed (shortness of breath). 11/19/21   Roxan Hockey, MD  Javier Docker Oil 300 MG CAPS Take 1 capsule by mouth 2 (two) times daily.    [provider]  Lactobacillus (ACIDOPHILUS PO) Take 1 tablet by mouth in the morning and at bedtime.    [provider]  leptospermum manuka honey (MEDIHONEY) PSTE paste Apply 1 Application topically daily. Apply Medihoney to right and left inner  and outer  thigh wounds and left lower buttock Q day, then cover with foam dressing.  Change foam dressings Q 3 days or PRN soiling. 2. Apply moist gauze packing to right middle buttock Q day, using swab to fill, then cover with foam dressing.  (Change foam dressing Q 3 days or PRN soiling.) 2. Foam dressings to right inner groin, left buttock, right lower buttock, right upper thigh.  Change Q 3 days or PRN soiling. Apply thin layer (3 mm) to wound. 11/20/21   Roxan Hockey, MD  levothyroxine (SYNTHROID) 150 MCG tablet Take 175 mcg by mouth daily.    [provider]  lisinopril (ZESTRIL) 2.5 MG tablet Take 2.5 mg by mouth daily.    [provider]  magnesium oxide (MAG-OX) 400 MG tablet Take 400 mg by mouth daily.    [provider]  Menthol-Zinc Oxide (CALMOSEPTINE) 0.44-20.6 % OINT Apply 1 Application topically every 8 (eight) hours as needed (protection/prevention).    [provider]  mirabegron ER (MYRBETRIQ) 25 MG TB24 tablet Take 25 mg by mouth daily.    [provider]  Multiple Vitamins-Minerals (CERTAGEN PO) Take 1 tablet by mouth daily.    [provider]  oxycodone (OXY-IR) 5 MG capsule Take 1 capsule (5 mg total) by mouth in the morning and at bedtime. 11/19/21   Roxan Hockey, MD  pantoprazole (PROTONIX) 40 MG tablet Take 1 tablet (40 mg total) by mouth daily. 11/20/21   Roxan Hockey, MD  potassium chloride 20 MEQ TBCR Take 10 mEq by mouth daily. 04/06/16   Kathie Dike, MD  tamsulosin (FLOMAX) 0.4 MG CAPS capsule Take 1 capsule (0.4 mg total) by mouth daily after supper. 11/19/21   Roxan Hockey, MD  topiramate (TOPAMAX) 50 MG tablet Take 50 mg by mouth 2 (two) times daily.     [provider]  vitamin C (ASCORBIC ACID) 500 MG tablet Take 500 mg by mouth 2 (two) times daily.    [provider]  Vitamin D, Ergocalciferol, (DRISDOL) 1.25 MG (50000 UNIT) CAPS capsule Take 50,000 Units by mouth every 7 (seven) days.    [provider]  Wound Dressings (PROMOGRAN EX) Apply topically daily.    [provider]     Critical care time: 35 min.   Montey Hora, [facility] For pager details, please see AMION or use Epic chat  After 1900, please call [facility] for cross coverage needs 12/22/2021, 7:52 PM

## 2021-12-22 NOTE — Progress Notes (Addendum)
IV consult received for placing line for vasopressor. Assessed bilateral arms with [place]. No appropriate vein noted at this time. Patient has very limited vasculature. Notified nurse. Fran Lowes, RN VAST

## 2021-12-22 NOTE — Procedures (Signed)
Central Venous Catheter Insertion Procedure Note  [name]  [id number]  [birth year]  Date:12/22/21  Time:10:40 PM   Provider Performing:Song Myre Shearon Stalls   Procedure: Insertion of Non-tunneled Central Venous Catheter(36556) with US guidance (06237)   Indication(s) Medication administration and Difficult access  Consent Unable to obtain consent due to emergent nature of procedure.  Anesthesia Topical only with 1% lidocaine   Timeout Verified patient identification, verified procedure, site/side was marked, verified correct patient position, special equipment/implants available, medications/allergies/relevant history reviewed, required imaging and test results available.  Sterile Technique Maximal sterile technique including full sterile barrier drape, hand hygiene, sterile gown, sterile gloves, mask, hair covering, sterile ultrasound probe cover (if used).  Procedure Description Area of catheter insertion was cleaned with chlorhexidine and draped in sterile fashion.  With real-time ultrasound guidance a central venous catheter was placed into the left internal jugular vein. Nonpulsatile blood flow and easy flushing noted in all ports.  The catheter was sutured in place and sterile dressing applied.  Complications/Tolerance None; patient tolerated the procedure well. Chest X-ray is ordered to verify placement for internal jugular or subclavian cannulation.   Chest x-ray is not ordered for femoral cannulation.  EBL Minimal  Specimen(s) None   Sandra Hora, PA - [facility] For pager details, please see AMION or use Epic chat  After 1900, please call [facility] for cross coverage needs 12/22/2021, 10:41 PM

## 2021-12-22 NOTE — ED Notes (Signed)
Both sets of blood cultures obtained before antibiotic administration

## 2021-12-22 NOTE — ED Notes (Signed)
Sacral wound repacked with packing gauze

## 2021-12-22 NOTE — ED Triage Notes (Signed)
Pt arrived via [facility] from [facility] with c/o AMS, general decline in eating habits, sleeping more, ulcers on bottom, and possible UTI. Per EMS, staff claims pt refusing to eat or let them change her. CBG 90

## 2021-12-22 NOTE — Progress Notes (Signed)
Patient admitted to 3M13 at 1900, no possessions brought with her. Pressors briefly titrated off, but were re-escalated back to original 43mg/min. O2 titrated to 4LNC is in no respiratory distress at this time.   Two separate nurses attempted to place Foley catheter without success. Dr. ELoanne Drillingwith EWarren Lacynotified, will call 6N for assistance.   IV team states unable to place [place]. guided IV. Levophed transfusing in another IV with blood return and IV watch in place. Dr. ELoanne Drillingnotified of this as well, will continue to monitor.   She has several wounds, will require wound consult. See LDA's.

## 2021-12-22 NOTE — Progress Notes (Signed)
Pharmacy Antibiotic Note  IMA [name] is a 76 y.o. female admitted on 12/22/2021 with sepsis.  Pharmacy has been consulted for meropenem dosing.  Plan: Meropenem 1000 mg IV every 12 hours. Monitor labs, c/s, and patient improvement.     Temp (24hrs), Avg:98.1 F (36.7 C), Min:98.1 F (36.7 C), Max:98.1 F (36.7 C)  Recent Labs  Lab 12/22/21 1342  WBC 35.7*  CREATININE 1.72*  LATICACIDVEN 1.1    CrCl cannot be calculated (Unknown ideal weight.).    Allergies  Allergen Reactions   Invanz [Ertapenem] Other (See Comments)    Patient states allergy but is not certain    Propranolol Hcl    Rocephin [Ceftriaxone] Hives   Vancomycin Hives    Antimicrobials this admission: Merrem 9/27 >>  Microbiology results: 9/27 BCx: pending 9/27 UCx: pending    Thank you for allowing pharmacy to be a part of this patient's care.  Ramond Craver 12/22/2021 4:46 PM

## 2021-12-22 NOTE — ED Notes (Signed)
MD made aware of BP

## 2021-12-22 NOTE — Progress Notes (Signed)
Dear Doctor: This patient has been identified as a candidate for PICC or CVC for the following reason (s): IV therapy over 48 hours, drug extravasation potential with tissue necrosis (KCL, Dilantin, Dopamine, CaCl, MgSO4, chemo vesicant), poor veins/poor circulatory system (CHF, COPD, emphysema, diabetes, steroid use, IV drug abuse, etc.), and incompatible drugs (aminophyllin, TPN, heparin, given with an antibiotic) If you agree, please write an order for the indicated device.  Thank you for supporting the early vascular access assessment program.

## 2021-12-22 NOTE — Progress Notes (Signed)
[place] Progress Note Patient Name: [name] DOB: [birth year] MRN: [id number]   Date of Service  12/22/2021  HPI/Events of Note  82F with paraplegic spinal paralysis, COPD/asthma, DM2, sacral ulcer, bipolar and HTN. From [name] ED and transferred to [place] [facility] for septic shock. Presented to ED with AMS, poor appetite. Concerned for possible UTI and/or sacral ulcer. Had desaturations requiring increased O2 requirement to 5L. Blood cx and urine culture obtained and started on meropenem. S/p NS 3L. Started on levophed. CXR 12/22/21 No acute infiltrate, effusion or edema. Chronic interstitial changes. Labs sig for WBC 35. Hg 7.1 (previously 9), K 5.9, CO2 12, BUN/Cr 83/1.72. LA 1.1.    Of note recent hospitalization for sepsis 2/2 UTI with similar presentation.  Arrived to [name] ICU. Awake and oriented to self. On 11L O2. Levophed 7 with SBP in 130s  eICU Interventions  Continue meropenem. Drug allergies to Vanc/Rocephin and Invanz F/u blood and urine cultures Wean levophed for MAP >65. Fluid bolus as needed Wean supplemental O2 for goal >88% Elink following     Intervention Category Evaluation Type: New Patient Evaluation  [name] 12/22/2021, 7:13 PM

## 2021-12-22 NOTE — ED Notes (Addendum)
Pt's SpO2 dropped to high 70s-low 80s on 3 L [place], pt was then bumped up to 5 L [place] and SpO2 went up to the high 90s.Respiratory called and at bedside, placed pt on high flow. MD made aware

## 2021-12-23 ENCOUNTER — Inpatient Hospital Stay (HOSPITAL_COMMUNITY): Payer: Medicare Other

## 2021-12-23 DIAGNOSIS — A419 Sepsis, unspecified organism: Secondary | ICD-10-CM | POA: Diagnosis not present

## 2021-12-23 DIAGNOSIS — R6521 Severe sepsis with septic shock: Secondary | ICD-10-CM | POA: Diagnosis not present

## 2021-12-23 LAB — BASIC METABOLIC PANEL
Anion gap: 15 (ref 5–15)
BUN: 64 mg/dL — ABNORMAL HIGH (ref 8–23)
CO2: 12 mmol/L — ABNORMAL LOW (ref 22–32)
Calcium: 8.3 mg/dL — ABNORMAL LOW (ref 8.9–10.3)
Chloride: 106 mmol/L (ref 98–111)
Creatinine, Ser: 1.46 mg/dL — ABNORMAL HIGH (ref 0.44–1.00)
GFR, Estimated: 38 mL/min — ABNORMAL LOW (ref >60)
Glucose, Bld: 136 mg/dL — ABNORMAL HIGH (ref 70–99)
Potassium: 4.6 mmol/L (ref 3.5–5.1)
Sodium: 133 mmol/L — ABNORMAL LOW (ref 135–145)

## 2021-12-23 LAB — C DIFFICILE QUICK SCREEN W PCR REFLEX
C Diff antigen: NEGATIVE
C Diff interpretation: NOT DETECTED
C Diff toxin: NEGATIVE

## 2021-12-23 LAB — GLUCOSE, CAPILLARY
Glucose-Capillary: 108 mg/dL — ABNORMAL HIGH (ref 70–99)
Glucose-Capillary: 112 mg/dL — ABNORMAL HIGH (ref 70–99)
Glucose-Capillary: 114 mg/dL — ABNORMAL HIGH (ref 70–99)
Glucose-Capillary: 118 mg/dL — ABNORMAL HIGH (ref 70–99)
Glucose-Capillary: 135 mg/dL — ABNORMAL HIGH (ref 70–99)
Glucose-Capillary: 144 mg/dL — ABNORMAL HIGH (ref 70–99)

## 2021-12-23 LAB — PHOSPHORUS: Phosphorus: 4.4 mg/dL (ref 2.5–4.6)

## 2021-12-23 LAB — CBC
HCT: 23 % — ABNORMAL LOW (ref 36.0–46.0)
Hemoglobin: 7 g/dL — ABNORMAL LOW (ref 12.0–15.0)
MCH: 26.9 pg (ref 26.0–34.0)
MCHC: 30.4 g/dL (ref 30.0–36.0)
MCV: 88.5 fL (ref 80.0–100.0)
Platelets: 442 10*3/uL — ABNORMAL HIGH (ref 150–400)
RBC: 2.6 MIL/uL — ABNORMAL LOW (ref 3.87–5.11)
RDW: 16.9 % — ABNORMAL HIGH (ref 11.5–15.5)
WBC: 31.2 10*3/uL — ABNORMAL HIGH (ref 4.0–10.5)
nRBC: 0 % (ref 0.0–0.2)

## 2021-12-23 LAB — MAGNESIUM: Magnesium: 2.4 mg/dL (ref 1.7–2.4)

## 2021-12-23 LAB — URINE CULTURE

## 2021-12-23 MED ORDER — LEVOFLOXACIN IN D5W 500 MG/100ML IV SOLN
500.0000 mg | INTRAVENOUS | Status: AC
  Administered 2021-12-24: 500 mg via INTRAVENOUS
  Filled 2021-12-23: qty 100

## 2021-12-23 MED ORDER — NOREPINEPHRINE 4 MG/250ML-% IV SOLN
0.0000 ug/min | INTRAVENOUS | Status: DC
  Administered 2021-12-23 (×2): 6 ug/min via INTRAVENOUS
  Administered 2021-12-23: 8 ug/min via INTRAVENOUS
  Filled 2021-12-23 (×3): qty 250

## 2021-12-23 MED ORDER — SODIUM CHLORIDE 0.9% FLUSH
10.0000 mL | Freq: Two times a day (BID) | INTRAVENOUS | Status: DC
  Administered 2021-12-23: 10 mL
  Administered 2021-12-23 – 2021-12-24 (×2): 30 mL
  Administered 2021-12-25 – 2021-12-29 (×10): 10 mL

## 2021-12-23 MED ORDER — ORAL CARE MOUTH RINSE: 15.0000 mL | OROMUCOSAL | Status: DC | PRN

## 2021-12-23 MED ORDER — PANTOPRAZOLE SODIUM 40 MG PO TBEC
40.0000 mg | DELAYED_RELEASE_TABLET | Freq: Every day | ORAL | Status: DC
  Administered 2021-12-23 – 2021-12-25 (×3): 40 mg via ORAL
  Filled 2021-12-23 (×3): qty 1

## 2021-12-23 MED ORDER — ORAL CARE MOUTH RINSE
15.0000 mL | OROMUCOSAL | Status: DC
  Administered 2021-12-23 – 2021-12-29 (×22): 15 mL via OROMUCOSAL

## 2021-12-23 MED ORDER — SODIUM CHLORIDE 0.9% FLUSH: 10.0000 mL | INTRAVENOUS | Status: DC | PRN

## 2021-12-23 MED ORDER — IOHEXOL 350 MG/ML SOLN
75.0000 mL | Freq: Once | INTRAVENOUS | Status: AC | PRN
  Administered 2021-12-23: 75 mL via INTRAVENOUS

## 2021-12-23 MED ORDER — LINEZOLID 600 MG/300ML IV SOLN
600.0000 mg | Freq: Two times a day (BID) | INTRAVENOUS | Status: DC
  Administered 2021-12-23 – 2021-12-27 (×9): 600 mg via INTRAVENOUS
  Filled 2021-12-23 (×11): qty 300

## 2021-12-23 NOTE — Consult Note (Signed)
Chief Complaint: Patient was seen in consultation today for  Chief Complaint  Patient presents with   Altered Mental Status    Referring Physician(s): Hillery Aldo, NP  Supervising Physician: Aletta Edouard  Patient Status: [facility] - In-pt  History of Present Illness: [name] is a 76 y.o. female with a medical history significant for paraplegia, DM2, obesity, COPD, bipolar 1 disorder, decubitus ulcer and recent urinary tract infection who presented to the ED with septic shock. She resides at a skilled nursing facility and was found to have confusion, generalized weakness and poor oral intake. Work up in the ICU was notable for leukocytosis, acute kidney injury, acute hypoxic respiratory failure, hypotension requiring pressors and an unstageable decubitus ulcer with leaking stool. She was recently hospitalized at [facility] for urosepsis. CT imaging showed multiple findings including severe right hydronephrosis.   CT abdomen/pelvis with contrast 12/23/21 IMPRESSION: 1. Bowel wall thickening of the rectosigmoid colon consistent with proctocolitis. 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 16 mm low-density area in the rectal wall along the left lateral aspect likely reflecting a small intramural abscess. 2. Severe right hydronephrosis. Right nephrolithiasis with the largest in the renal pelvis measuring 3.6 cm. 4 mm right mid ureteral calculus. 7 mm right distal ureteral calculus. 3. Decubitus ulcer overlying the right ischial tuberosity.  Interventional Radiology has been asked to evaluate this patient for an image-guided right percutaneous nephrostomy tube. Imaging reviewed and procedure approved by Dr. Kathlene Cote.    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 Authorizing Provider  acetaminophen (TYLENOL) 325 MG tablet Take 325 mg by mouth 2 (two) times daily. *May take one every 6 hours as needed for pain   Yes [provider]  allopurinol (ZYLOPRIM) 100 MG tablet Take 100 mg by mouth daily.  10/14/14  Yes [provider]  Amino Acids-Protein Hydrolys (FEEDING SUPPLEMENT, PRO-STAT 64,) LIQD Take 30 mLs by mouth 3 (three) times daily with meals.   Yes [provider]  aspirin EC 81 MG tablet Take 81 mg by mouth daily.   Yes [provider]  atorvastatin (LIPITOR) 10 MG tablet Take 10 mg by mouth daily.   Yes [provider]  azelastine (ASTELIN) 0.1 % nasal spray Place 2 sprays into both nostrils 2 (two) times daily. Use in each nostril as directed   Yes [provider]  b complex vitamins tablet Take 1 tablet by mouth daily.   Yes [provider]  buPROPion (WELLBUTRIN XL) 300 MG 24 hr tablet Take 300 mg by mouth daily.    Yes [provider]  Calcium Carb-Cholecalciferol (CALCIUM CARBONATE-VITAMIN D3 PO) Take 1 tablet by mouth in the morning and at bedtime.   Yes [provider]  carvedilol (COREG) 12.5 MG tablet Take 12.5 mg by mouth 2 (two) times daily with a meal.   Yes [provider]  Cranberry 500 MG TABS Take 1 capsule by mouth daily.   Yes [provider]  dextromethorphan-guaiFENesin (MUCINEX DM) 30-600 MG per 12 hr tablet Take 1 tablet by mouth every 12 (twelve) hours as needed for cough.    Yes [provider]  docusate sodium (COLACE) 100 MG capsule Take 100 mg by mouth  at bedtime.   Yes [provider]  ezetimibe (ZETIA) 10 MG tablet Take 10 mg by mouth at bedtime.    Yes [provider]  famotidine (PEPCID) 20 MG tablet Take 20 mg by mouth 2 (two) times daily.   Yes [provider]  ferrous sulfate 325 (65 FE) MG EC tablet Take 325 mg by mouth 3 (three) times daily with meals.   Yes [provider]  fluticasone (FLONASE) 50 MCG/ACT nasal spray Place 2 sprays into both nostrils daily.  09/03/14  Yes [provider]  furosemide (LASIX) 20 MG tablet Take 1 tablet (20 mg total) by mouth daily. 11/19/21  Yes Emokpae, Courage, MD  gabapentin (NEURONTIN) 300 MG capsule Take 300 mg by mouth every 8 (eight) hours.   Yes [provider]  ipratropium-albuterol (DUONEB) 0.5-2.5 (3) MG/3ML SOLN Take 3 mLs by nebulization every 6 (six) hours as needed (shortness of breath). 11/19/21  Yes Roxan Hockey, MD  Javier Docker Oil 300 MG CAPS Take 1 capsule by mouth 2 (two) times daily.   Yes [provider]  Lactobacillus (ACIDOPHILUS PO) Take 1 tablet by mouth in the morning and at bedtime.   Yes [provider]  levothyroxine (SYNTHROID) 150 MCG tablet Take 175 mcg by mouth daily.   Yes [provider]  lisinopril (ZESTRIL) 2.5 MG tablet Take 2.5 mg by mouth daily.   Yes [provider]  magnesium oxide (MAG-OX) 400 MG tablet Take 400 mg by mouth daily.   Yes [provider]  Menthol-Zinc Oxide (CALMOSEPTINE) 0.44-20.6 % OINT Apply 1 Application topically every 8 (eight) hours as needed (protection/prevention).   Yes [provider]  mirabegron ER (MYRBETRIQ) 25 MG TB24 tablet Take 25 mg by mouth daily.   Yes [provider]  Multiple Vitamins-Minerals (CERTAGEN PO) Take 1 tablet by mouth daily.   Yes [provider]  Nutritional Supplements (ARGINAID) PACK Take 1 packet by mouth daily.   Yes [provider]  oxycodone (OXY-IR) 5 MG capsule Take 1 capsule  (5 mg total) by mouth in the morning and at bedtime. 11/19/21  Yes Emokpae, Courage, MD  pantoprazole (PROTONIX) 40 MG tablet Take 1 tablet (40 mg total) by mouth daily. 11/20/21  Yes Emokpae, Courage, MD  potassium chloride 20 MEQ TBCR Take 10 mEq by mouth daily. 04/06/16  Yes Kathie Dike, MD  tamsulosin (FLOMAX) 0.4 MG CAPS capsule Take 1 capsule (0.4 mg total) by mouth daily after supper. 11/19/21  Yes Emokpae, Courage, MD  topiramate (TOPAMAX) 50 MG tablet Take 50 mg by mouth 2 (two) times daily.   Yes [provider]  vitamin C (ASCORBIC ACID) 500 MG tablet Take 500 mg by mouth 2 (two) times daily.   Yes [provider]  Vitamin D, Ergocalciferol, (DRISDOL) 1.25 MG (50000 UNIT) CAPS capsule Take 50,000 Units by mouth every 7 (seven) days.   Yes [provider]  leptospermum manuka honey (MEDIHONEY) PSTE paste Apply 1 Application topically daily. Apply Medihoney to right and left inner  and outer thigh wounds and left lower buttock Q day, then cover with foam dressing.  Change foam dressings Q 3  days or PRN soiling. 2. Apply moist gauze packing to right middle buttock Q day, using swab to fill, then cover with foam dressing.  (Change foam dressing Q 3 days or PRN soiling.) 2. Foam dressings to right inner groin, left buttock, right lower buttock, right upper thigh.  Change Q 3 days or PRN soiling. Apply thin layer (3 mm) to wound. Patient not taking: Reported on 12/22/2021 11/20/21   Roxan Hockey, MD  SANTYL 250 UNIT/GM ointment Apply 1 Application topically daily. 12/22/21   [provider]  Wound Dressings (PROMOGRAN EX) Apply topically daily.    [provider]     Family History  Adopted: Yes  Problem Relation Age of Onset   Heart disease Mother     Social History   Socioeconomic History   Marital status: Single    Spouse name: Not on file   Number of children: Not on file   Years of education: Not on file   Highest education level: Not  on file  Occupational History   Not on file  Tobacco Use   Smoking status: Former   Smokeless tobacco: Never  Substance and Sexual Activity   Alcohol use: Not Currently    Alcohol/week: 0.0 standard drinks of alcohol    Comment: "weekend drinker, Saturday night drunk" years ago   Drug use: No   Sexual activity: Never  Other Topics Concern   Not on file  Social History Narrative   Not on file   Social Determinants of Health   Financial Resource Strain: Not on file  Food Insecurity: Not on file  Transportation Needs: Not on file  Physical Activity: Not on file  Stress: Not on file  Social Connections: Not on file    Review of Systems: A 12 point ROS discussed and pertinent positives are indicated in the HPI above.  All other systems are negative.  Review of Systems  Unable to perform ROS: Mental status change    Vital Signs: BP (!) 112/56   Pulse 81   Temp 97.7 F (36.5 C) (Oral)   Resp 12   Ht '5\' 7"'$  (1.702 m)   Wt 225 lb 1.4 oz (102.1 kg)   SpO2 93%   BMI 35.25 kg/m   Physical Exam Constitutional:      General: She is not in acute distress.    Appearance: She is ill-appearing.  HENT:     Mouth/Throat:     Mouth: Mucous membranes are moist.     Pharynx: Oropharynx is clear.  Cardiovascular:     Rate and Rhythm: Normal rate and regular rhythm.     Pulses: Normal pulses.     Heart sounds: Normal heart sounds.     Comments: Left IJ central line.  Pulmonary:     Effort: Pulmonary effort is normal.     Breath sounds: Normal breath sounds.  Abdominal:     General: Bowel sounds are normal.     Palpations: Abdomen is soft.  Genitourinary:    Comments: Foley catheter Musculoskeletal:     Right lower leg: Edema present.     Left lower leg: Edema present.  Skin:    General: Skin is warm and dry.     Coloration: Skin is pale.  Neurological:     Mental Status: She is disoriented.     Comments: Patient opens eyes to voice. She was able to state she wasn't in  pain. Unable to answer other orientation questions.      Imaging: CT  ABDOMEN PELVIS W CONTRAST  Result Date: 12/23/2021 CLINICAL DATA:  Sacral wound leaking stool. EXAM: CT ABDOMEN AND PELVIS WITH CONTRAST TECHNIQUE: Multidetector CT imaging of the abdomen and pelvis was performed using the standard protocol following bolus administration of intravenous contrast. RADIATION DOSE REDUCTION: This exam was performed according to the departmental dose-optimization program which includes automated exposure control, adjustment of the mA and/or kV according to patient size and/or use of iterative reconstruction technique. CONTRAST:  12m OMNIPAQUE IOHEXOL 350 MG/ML SOLN COMPARISON:  05/21/2020 FINDINGS: Lower chest: Trace bilateral pleural effusions. Bibasilar atelectasis. Hepatobiliary: Cirrhosis without focal hepatic mass. No gallstones, gallbladder wall thickening, or biliary dilatation. Pancreas: Unremarkable. No pancreatic ductal dilatation or surrounding inflammatory changes. Spleen: Normal in size without focal abnormality. Adrenals/Urinary Tract: Adrenal glands are unremarkable. Stable bilateral renal cysts for which no further evaluation is recommended. Severe right hydronephrosis. Right nephrolithiasis with the largest in the renal pelvis measuring 3.6 cm. 4 mm right mid ureteral calculus. 7 mm right distal ureteral calculus. Decompressed bladder. Foley catheter in the bladder. Stomach/Bowel: No bowel dilatation to suggest bowel obstruction. Bowel wall thickening of the rectosigmoid colon consistent with proctocolitis. 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 16 mm low-density area in the rectal wall along the left lateral aspect likely reflecting a small intramural abscess. Vascular/Lymphatic: Normal caliber abdominal aorta with mild atherosclerosis. No lymphadenopathy. Reproductive: Status post hysterectomy. No adnexal masses. Other: No abdominal wall  hernia or abnormality. No abdominopelvic ascites. Sacral decubitus ulcer. Decubitus ulcer overlying the right ischial tuberosity. Generalized osteopenia. Mild osteoarthritis of bilateral SI joints. Mild osteoarthritis of the right hip. Musculoskeletal: No acute osseous abnormality. No aggressive osseous lesion. Diffuse thoracolumbar spondylosis. 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 IMPRESSION: 1. Bowel wall thickening of the rectosigmoid colon consistent with proctocolitis. 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 16 mm low-density area in the rectal wall along the left lateral aspect likely reflecting a small intramural abscess. 2. Severe right hydronephrosis. Right nephrolithiasis with the largest in the renal pelvis measuring 3.6 cm. 4 mm right mid ureteral calculus. 7 mm right distal ureteral calculus. 3. Decubitus ulcer overlying the right ischial tuberosity. Electronically Signed   By: HKathreen DevoidM.D.   On: 12/23/2021 14:13   DG Chest Port 1 View  Result Date: 12/23/2021 CLINICAL DATA:  Respiratory failure EXAM: PORTABLE CHEST 1 VIEW COMPARISON:  Radiograph 12/22/2021 FINDINGS: Left neck approach catheter tip overlies the proximal SVC. Unchanged cardiomediastinal silhouette with unchanged tortuosity of the aorta. New bandlike left basilar opacity, likely atelectasis. No large effusion. No evidence of pneumothorax. Bones are unchanged. IMPRESSION: New bandlike left basilar opacity, favored to be atelectasis. Continued radiographic follow-up recommended. Electronically Signed   By: JMaurine SimmeringM.D.   On: 12/23/2021 08:07   DG CHEST PORT 1 VIEW  Result Date: 12/22/2021 CLINICAL DATA:  Central line EXAM: PORTABLE CHEST 1 VIEW COMPARISON:  Chest x-ray 12/22/2021 FINDINGS: There is a new left-sided central venous catheter with distal tip projecting over the brachiocephalic SVC junction. The aorta is tortuous, unchanged. Heart size is  within normal limits. There is no focal lung infiltrate, pleural effusion or pneumothorax. No acute fractures are seen. IMPRESSION: New left-sided central venous catheter with distal tip projecting over the brachiocephalic SVC junction. No pneumothorax. Electronically Signed   By: ARonney AstersM.D.   On: 12/22/2021 22:49   DG Chest Port 1 View  Result Date: 12/22/2021 CLINICAL DATA:  Altered mental  status and cough. EXAM: PORTABLE CHEST 1 VIEW COMPARISON:  Chest radiograph 11/15/2021 FINDINGS: Patient appears to be slightly rotated towards the right. Heart and mediastinum are within normal limits and stable. Slightly coarse lung markings are suggestive for chronic changes. No focal airspace disease or pulmonary edema. Mild widening at the bilateral AC joints. Negative for a pneumothorax. IMPRESSION: No acute chest findings. Electronically Signed   By: Markus Daft M.D.   On: 12/22/2021 13:29    Labs:  CBC: Recent Labs    11/19/21 1249 12/22/21 1342 12/22/21 2123 12/23/21 0310  WBC 12.5* 35.7* 41.8* 31.2*  HGB 9.1* 7.1* 8.0* 7.0*  HCT 29.4* 23.8* 26.7* 23.0*  PLT 284 470* 420* 442*    COAGS: Recent Labs    11/14/21 2053 11/15/21 0352  INR 1.4* 1.5*  APTT 48*  --     BMP: Recent Labs    11/19/21 1249 12/22/21 1342 12/22/21 2214 12/23/21 0310  NA 136 127* 132* 133*  K 4.7 5.9* 4.9 4.6  CL 113* 102 107 106  CO2 19* 12* 16* 12*  GLUCOSE 100* 80 110* 136*  BUN 17 83* 72* 64*  CALCIUM 8.2* 8.2* 8.1* 8.3*  CREATININE 0.34* 1.72* 1.67* 1.46*  GFRNONAA >60 31* 32* 38*    LIVER FUNCTION TESTS: Recent Labs    11/14/21 2053 11/15/21 0352 11/18/21 0310 12/22/21 1342  BILITOT 1.0 0.7 0.6 1.0  AST 46* 50* 46* 29  ALT 29 32 40 21  ALKPHOS 139* 130* 101 146*  PROT 6.6 5.9* 5.2* 6.8  ALBUMIN 2.8* 2.3* 2.0* 2.2*    TUMOR MARKERS: No results for input(s): "AFPTM", "CEA", "CA199", "CHROMGRNA" in the last 8760 hours.  Assessment and Plan:  Right hydronephrosis: Gala Romney, 76 year old female, is tentatively scheduled tomorrow for an image-guided right percutaneous nephrostomy tube. The procedure was discussed and consent obtained from the patient's daughter [name] via telephone call.   Risks and benefits of Right PCN placement were discussed with the patient including, but not limited to, infection, bleeding, significant bleeding causing loss or decrease in renal function or damage to adjacent structures.   All of the patient's questions were answered, patient is agreeable to proceed. She will be NPO at midnight. AM labs ordered. Last dose of subcutaneous heparin was 12/23/21 at 0600.   Consent signed and in IR.   Thank you for this interesting consult.  I greatly enjoyed meeting MAIAH SINNING and look forward to participating in their care.  A copy of this report was sent to the requesting provider on this date.  Electronically Signed: Soyla Dryer, AGACNP-BC [phone] 12/23/2021, 3:20 PM   I spent a total of 20 Minutes    in face to face in clinical consultation, greater than 50% of which was counseling/coordinating care for right PCN.

## 2021-12-23 NOTE — Progress Notes (Addendum)
NAME:  [name], MRN:  [id number], DOB:  20-May-1945, LOS: 1 ADMISSION DATE:  12/22/2021, CONSULTATION DATE:  9/27   REFERRING MD:  Mayra Neer, CHIEF COMPLAINT:  AMS   History of Present Illness:  [name], is a 76 y.o. female, who presented to the [facility] from [facility] with a chief complaint of AMS  They have a pertinent past medical history of paraplegic spinal paralysis, COPD, DM 2, GERD, hypertension, obesity, hypothyroidism, unstageable decubitus ulcer.  Per notes prior to arrival, patient with recent decline in eating habits, sleeping more, possible UTI. Recent admission to [facility] on 8/20-8/25 with urosepsis secondary to gram negative UTI.   ED course was notable for WBC of 35.7, blood pressure of 76/45. Patient with AKI and K 5.9, A code sepsis was called. 3L IVF was given. BC and UC were obtained. Meropenem was started.   PCCM was consulted for admission.  Upon arrival to [facility], pt was on 7 Levophed; however, IV noted to not be functioning appropriately. SBP in 120's so Levophed turned off while awaiting new IV.  Pertinent  Medical History  paraplegic spinal paralysis, COPD, DM2, , GERD, hypertension, obesity, hypothyroidism, unstageable decubitus ulcer.  Significant Hospital Events: Including procedures, antibiotic start and stop dates in addition to other pertinent events   9/27 Presented to [facility], meropenem, PCCM consult   Interim History / Subjective:  No acute events overnight  Tmax 98.1  1L UOP, x1 unmeasured UOP, +195 admission, suspect [facility] Ed volume not accounted for.   Drips:  Levophed 12mg  Subjective: slight confused,, denies pain, denies chest pain, denies SOB.  Objective   Blood pressure (!) 105/53, pulse 81, temperature 98.1 F (36.7 C), temperature source Axillary, resp. rate 13, height '5\' 7"'$  (1.702 m), weight 102.1 kg, SpO2 94 %. 4L [place]        Intake/Output Summary (Last 24 hours) at 12/23/2021 0805 Last data filed at 12/23/2021 0600 Gross per 24  hour  Intake 1240.59 ml  Output 1045 ml  Net 195.59 ml   Filed Weights   12/22/21 1918 12/23/21 0441  Weight: 97.3 kg 102.1 kg    Examination: General: In bed, NAD, appears comfortable HEENT: MM pink/moist, anicteric, atraumatic Neuro: RASS 0, PERRL 34m GCS 14, confused CV: S1S2, NSR, no m/r/g appreciated PULM:  clear in the upper lobes, clear in the lower lobes, trachea midline, chest expansion symmetric GI: Obese, soft, bsx4 active, non-tender   Extremities: warm/dry, no pretibial edema, capillary refill less than 3 seconds  Skin: See photos below  Rectum and DTI on left hip in rect    RT lateral thigh   L lateral thigh   Rt ischium      Labs/Imaging Blood cultures: NGTD 12 hrs UC: Pending Potassium 5.9 > 4.9 > 4.6 BG 80-136 WBC 35.7 > 41.8 > 31.2  Hemoglobin 7.1 > 8 > 7.0 Platelets 470 > 428 > 442 Sodium 127 > 132 > 133 CO2 12, anion gap 15 Creatinine 1.72 > 1.67 > 1.46, BUN 83 > 72 > 64  Magnesium 2.8 > 2.5 > 2.4 TSH 3.77 PCT 0.84 CXR 9/27: Central venous catheter in satisfactory position, no pneumothorax, no pleural effusion, no focal lung infiltrate noted.   Assessment & Plan:   Septic Shock - presumed secondary to urosepsis vs wound infection. History of SE bacteremia on 11/19/21 admission. ECHO 8/23 (EF 65-70%, G2DD). BNP 28, Trop  5>5. PCT 0.84. WBC 35.7 > 41.8 > 31.2  -Goal MAP 65.  Continue Levophed.  Titrate to goal -MIVF at 75 mL an hour -Continue meropenem (reported allergy to Vanco, ceftriaxone, and invanz noted.  Will discuss with pharmacy). Adding Linezolid -Follow cultures.  Narrow as indicated. -General Surgery called this AM to look at rectal wound, patient paraplegic so unable to assess amount of pain in rectal wound. No abdominal imaging at this time. Discussed with [place] PA. To see, discuss with GS attending, and order imaging.   Acute respiratory failure with hypoxia. COPD. Documented SPO2 of 82% overnight.  On 4 L nasal cannula.  No obvious infiltrate. -Continue supplemental oxygen.  Goal SPO2 88 to 96%.  Titrate to goal. -Pulm toilet as able  Acute metabolic encephalopathy GCS 14, slightly confused, unclear baseline, confused to place.  -Monitor mental status -supportive care as above  AKI Suspect secondary to hypotension/septic ATN.  Hyponatremia - presumed hypovolemic. Hyperkalemia- Resolved Creatinine 1.72 > 1.67 > 1.46, BUN 83 > 72 > 64. Sodium 127 > 132 > 133. Potassium 5.9 > 4.9 > 4.6 s/p 10g Lokelma, 1 amp D50, 10u insulin. -Ensure renal perfusion. Goal MAP 65 or greater. -Avoid neprotoxic drugs as possible. -Strict I&O's -Follow up AM creatinine, Renal US, UA. -Continue foley -Continue gentile IVF. -Follow up on AM labs  Normocytic anemia, suspect chronic Hemoglobin 7.1 > 8 > 7.0 -Transfuse PRBC if HBG less than 7 -Obtain AM CBC to trend H&H -Monitor for signs of bleeding  Hx hypertension Currently on vasopressors -Continue to hold home antihypertensives  Hx DM2 BG 80-136 -Blood Glucose goal 140-180. -SSI  Hx Hypothyroidism. TSh 3.77 -Continue Synthroid  Hx GERD -Continue PPI  HX paraplegic spinal paralysis. -Continue PT OT.  Unstageable decubitus ulcer (POA). -WOC consult placed   Best Practice (right click and "Reselect all SmartList Selections" daily)   Diet/type: Regular consistency (see orders) DVT prophylaxis: prophylactic heparin  GI prophylaxis: PPI Lines: Central line Foley:  Yes, and it is still needed Code Status:  full code Last date of multidisciplinary goals of care discussion: None yet.    Critical care time: 41 min.   The patient is critically ill with multiple organ systems failure and requires high complexity decision making for assessment and support, frequent evaluation and titration of therapies, application of advanced monitoring technologies and extensive interpretation of multiple databases.    Critical Care Time devoted to patient care  services described in this note is 41 minutes. This time reflects time of care of this [place] NP. This critical care time does not reflect procedure time but could involve care discussion time with the PCCM attending.  Redmond School., MSN, APRN, AGACNP-BC [facility]  12/23/2021 , 8:05 AM  Please see [email or web address] for pager details  If no response, please call [phone] After hours, please call Elink at [phone]

## 2021-12-23 NOTE — Consult Note (Addendum)
[name] December 21, 1945  [id number].    Requesting MD: Lamonte Sakai, MD  Chief Complaint/Reason for Consult: sacral wound, sepsis  HPI:  [name] is a 76 year old female with a past medical history of paraplegia, type 2 diabetes, obesity, decubitus ulcer, hypothyroidism, and recent urinary tract infection who presented from [facility] with a diagnosis of septic shock.  Per report she presented to [name] from a skilled nursing facility with confusion, generalized weakness, poor oral intake.  In our ICU she is noted to have WBC 81.1, Bolick acidosis, acute kidney injury, septic shock requiring IV vasopressor support.  The patient is oriented to self only, she is unable to tell me why or how long she has been paraplegic.  She is unable to tell me any details about her decubitus ulcers.  A Foley was placed revealing cloudy urine.  A decubitus wound draining stool was noted on exam and surgery is asked to evaluate.   ROS: Review of Systems  Unable to perform ROS: Mental status change   Family History  Adopted: Yes  Problem Relation Age of Onset   Heart disease Mother     Past Medical History:  Diagnosis Date   Bipolar 1 disorder ([name])    COPD with asthma ([name])    Diabetes mellitus without complication ([name])    GERD (gastroesophageal reflux disease)    Hypertension    Obesity    Paraplegic spinal paralysis ([name])    Pyelonephritis    Thyroid disease     Past Surgical History:  Procedure Laterality Date   ABDOMINAL HYSTERECTOMY     BACK SURGERY      Social History:  reports that she has quit smoking. She has never used smokeless tobacco. She reports that she does not currently use alcohol. She reports that she does not use drugs.  Allergies:  Allergies  Allergen Reactions   Invanz [Ertapenem] Other (See Comments)    Patient states allergy but is not certain    Propranolol Hcl    Rocephin [Ceftriaxone] Hives   Vancomycin Hives    Medications Prior to  Admission  Medication Sig Dispense Refill   acetaminophen (TYLENOL) 325 MG tablet Take 325 mg by mouth 2 (two) times daily. *May take one every 6 hours as needed for pain     allopurinol (ZYLOPRIM) 100 MG tablet Take 100 mg by mouth daily.      Amino Acids-Protein Hydrolys (FEEDING SUPPLEMENT, PRO-STAT 64,) LIQD Take 30 mLs by mouth 3 (three) times daily with meals.     aspirin EC 81 MG tablet Take 81 mg by mouth daily.     atorvastatin (LIPITOR) 10 MG tablet Take 10 mg by mouth daily.     azelastine (ASTELIN) 0.1 % nasal spray Place 2 sprays into both nostrils 2 (two) times daily. Use in each nostril as directed     b complex vitamins tablet Take 1 tablet by mouth daily.     buPROPion (WELLBUTRIN XL) 300 MG 24 hr tablet Take 300 mg by mouth daily.      Calcium Carb-Cholecalciferol (CALCIUM CARBONATE-VITAMIN D3 PO) Take 1 tablet by mouth in the morning and at bedtime.     carvedilol (COREG) 12.5 MG tablet Take 12.5 mg by mouth 2 (two) times daily with a meal.     Cranberry 500 MG TABS Take 1 capsule by mouth daily.     dextromethorphan-guaiFENesin (MUCINEX DM) 30-600 MG per 12 hr tablet Take 1 tablet by mouth every 12 (twelve)  hours as needed for cough.      docusate sodium (COLACE) 100 MG capsule Take 100 mg by mouth at bedtime.     ezetimibe (ZETIA) 10 MG tablet Take 10 mg by mouth at bedtime.      famotidine (PEPCID) 20 MG tablet Take 20 mg by mouth 2 (two) times daily.     ferrous sulfate 325 (65 FE) MG EC tablet Take 325 mg by mouth 3 (three) times daily with meals.     fluticasone (FLONASE) 50 MCG/ACT nasal spray Place 2 sprays into both nostrils daily.      furosemide (LASIX) 20 MG tablet Take 1 tablet (20 mg total) by mouth daily. 30 tablet 3   gabapentin (NEURONTIN) 300 MG capsule Take 300 mg by mouth every 8 (eight) hours.     ipratropium-albuterol (DUONEB) 0.5-2.5 (3) MG/3ML SOLN Take 3 mLs by nebulization every 6 (six) hours as needed (shortness of breath). 360 mL 2   Krill Oil 300  MG CAPS Take 1 capsule by mouth 2 (two) times daily.     Lactobacillus (ACIDOPHILUS PO) Take 1 tablet by mouth in the morning and at bedtime.     levothyroxine (SYNTHROID) 150 MCG tablet Take 175 mcg by mouth daily.     lisinopril (ZESTRIL) 2.5 MG tablet Take 2.5 mg by mouth daily.     magnesium oxide (MAG-OX) 400 MG tablet Take 400 mg by mouth daily.     Menthol-Zinc Oxide (CALMOSEPTINE) 0.44-20.6 % OINT Apply 1 Application topically every 8 (eight) hours as needed (protection/prevention).     mirabegron ER (MYRBETRIQ) 25 MG TB24 tablet Take 25 mg by mouth daily.     Multiple Vitamins-Minerals (CERTAGEN PO) Take 1 tablet by mouth daily.     Nutritional Supplements (ARGINAID) PACK Take 1 packet by mouth daily.     oxycodone (OXY-IR) 5 MG capsule Take 1 capsule (5 mg total) by mouth in the morning and at bedtime. 10 capsule 0   pantoprazole (PROTONIX) 40 MG tablet Take 1 tablet (40 mg total) by mouth daily. 30 tablet 2   potassium chloride 20 MEQ TBCR Take 10 mEq by mouth daily.     tamsulosin (FLOMAX) 0.4 MG CAPS capsule Take 1 capsule (0.4 mg total) by mouth daily after supper. 30 capsule 3   topiramate (TOPAMAX) 50 MG tablet Take 50 mg by mouth 2 (two) times daily.     vitamin C (ASCORBIC ACID) 500 MG tablet Take 500 mg by mouth 2 (two) times daily.     Vitamin D, Ergocalciferol, (DRISDOL) 1.25 MG (50000 UNIT) CAPS capsule Take 50,000 Units by mouth every 7 (seven) days.     leptospermum manuka honey (MEDIHONEY) PSTE paste Apply 1 Application topically daily. Apply Medihoney to right and left inner  and outer thigh wounds and left lower buttock Q day, then cover with foam dressing.  Change foam dressings Q 3 days or PRN soiling. 2. Apply moist gauze packing to right middle buttock Q day, using swab to fill, then cover with foam dressing.  (Change foam dressing Q 3 days or PRN soiling.) 2. Foam dressings to right inner groin, left buttock, right lower buttock, right upper thigh.  Change Q 3 days or  PRN soiling. Apply thin layer (3 mm) to wound. (Patient not taking: Reported on 12/22/2021) 44 mL 3   SANTYL 250 UNIT/GM ointment Apply 1 Application topically daily.     Wound Dressings (PROMOGRAN EX) Apply topically daily.       Physical Exam: Blood  pressure (!) 105/53, pulse 81, temperature 98.1 F (36.7 C), temperature source Axillary, resp. rate 13, height '5\' 7"'$  (1.702 m), weight 102.1 kg, SpO2 94 %. General: Chronically ill-appearing white elderly female HEENT: head -normocephalic, atraumatic; Eyes: Pupils are equal and round, anicteric sclerae; IJ central venous access noted Neck- Trachea is midline CV- RRR, there is bilateral lower extremity edema Pulm- breathing is non-labored. CTABL, no wheezes, rhales, rhonchi. Abd- soft, obese, nontender nondistended, mild erythema of bilateral inguinal folds without open wounds GU-see photo below.  Patient's anatomy is not straightforward.   -  Patient has an indwelling Foley catheter draining cloudy, purulent urine without gross hematuria.  -  Bimanual exam was performed no masses, blood, or stool noted in the vaginal canal. -  There is a right ischial ulcer with a clean base that tracks roughly 10 cm cephalad.  This was packed by the bedside nurse with saline moistened gauze. -There is a roughly 6 x 8 cm decubitus ulcer that is draining stool.  Stool was wiped away to reveal what appears to be a mixture of viable and necrotic soft tissue, I can see and palpate what appears to be colonic tissue in the superior and inferior aspects of the wound-it appears red, viable, budded. I do not see an obvious bowel lumen but it is obscured by soft tissue and stool.  The periwound appears macerated and erythematous.  There is no fluctuance, induration, or crepitus.   MSK-upper extremities are symmetrical, lower extremities with chronic deformity present and edema. Neuro-nonfocal exam, moving both upper extremities and following commands, Psych- Alert and  Oriented x3 with appropriate affect Skin: No obvious rashes or masses.  Results for orders placed or performed during the hospital encounter of 12/22/21 (from the past 48 hour(s))  SARS Coronavirus 2 by RT PCR (hospital order, performed in [facility] hospital lab) *cepheid single result test* Anterior Nasal Swab     Status: None   Collection Time: 12/22/21  1:40 PM   Specimen: Anterior Nasal Swab  Result Value Ref Range   SARS Coronavirus 2 by RT PCR NEGATIVE NEGATIVE    Comment: (NOTE) SARS-CoV-2 target nucleic acids are NOT DETECTED.  The SARS-CoV-2 RNA is generally detectable in upper and lower respiratory specimens during the acute phase of infection. The lowest concentration of SARS-CoV-2 viral copies this assay can detect is 250 copies / mL. A negative result does not preclude SARS-CoV-2 infection and should not be used as the sole basis for treatment or other patient management decisions.  A negative result may occur with improper specimen collection / handling, submission of specimen other than nasopharyngeal swab, presence of viral mutation(s) within the areas targeted by this assay, and inadequate number of viral copies (<250 copies / mL). A negative result must be combined with clinical observations, patient history, and epidemiological information.  Fact Sheet for Patients:   https://www.patel.info/  Fact Sheet for Healthcare Providers: https://hall.com/  This test is not yet approved or  cleared by the [place] FDA and has been authorized for detection and/or diagnosis of SARS-CoV-2 by FDA under an Emergency Use Authorization (EUA).  This EUA will remain in effect (meaning this test can be used) for the duration of the COVID-19 declaration under Section 564(b)(1) of the Act, 21 U.S.C. section 360bbb-3(b)(1), unless the authorization is terminated or revoked sooner.  Performed at [facility], [address],  [place], [place] [address]   CBC with Differential     Status: Abnormal   Collection Time: 12/22/21  1:42 PM  Result Value Ref Range   WBC 35.7 (H) 4.0 - 10.5 K/uL   RBC 2.65 (L) 3.87 - 5.11 MIL/uL   Hemoglobin 7.1 (L) 12.0 - 15.0 g/dL   HCT 23.8 (L) 36.0 - 46.0 %   MCV 89.8 80.0 - 100.0 fL   MCH 26.8 26.0 - 34.0 pg   MCHC 29.8 (L) 30.0 - 36.0 g/dL   RDW 17.2 (H) 11.5 - 15.5 %   Platelets 470 (H) 150 - 400 K/uL   nRBC 0.0 0.0 - 0.2 %   Neutrophils Relative % 88 %   Neutro Abs 31.2 (H) 1.7 - 7.7 K/uL   Lymphocytes Relative 5 %   Lymphs Abs 1.9 0.7 - 4.0 K/uL   Monocytes Relative 5 %   Monocytes Absolute 1.9 (H) 0.1 - 1.0 K/uL   Eosinophils Relative 0 %   Eosinophils Absolute 0.1 0.0 - 0.5 K/uL   Basophils Relative 0 %   Basophils Absolute 0.1 0.0 - 0.1 K/uL   WBC Morphology TOXIC GRANULATION    RBC Morphology POLYCHROMASIA    Smear Review MORPHOLOGY UNREMARKABLE    Immature Granulocytes 2 %   Abs Immature Granulocytes 0.53 (H) 0.00 - 0.07 K/uL   Polychromasia PRESENT     Comment: Performed at [facility], [address], [place], [place] [address]  Comprehensive metabolic panel     Status: Abnormal   Collection Time: 12/22/21  1:42 PM  Result Value Ref Range   Sodium 127 (L) 135 - 145 mmol/L   Potassium 5.9 (H) 3.5 - 5.1 mmol/L   Chloride 102 98 - 111 mmol/L   CO2 12 (L) 22 - 32 mmol/L   Glucose, Bld 80 70 - 99 mg/dL    Comment: Glucose reference range applies only to samples taken after fasting for at least 8 hours.   BUN 83 (H) 8 - 23 mg/dL   Creatinine, Ser 1.72 (H) 0.44 - 1.00 mg/dL   Calcium 8.2 (L) 8.9 - 10.3 mg/dL   Total Protein 6.8 6.5 - 8.1 g/dL   Albumin 2.2 (L) 3.5 - 5.0 g/dL   AST 29 15 - 41 U/L   ALT 21 0 - 44 U/L   Alkaline Phosphatase 146 (H) 38 - 126 U/L   Total Bilirubin 1.0 0.3 - 1.2 mg/dL   GFR, Estimated 31 (L) >60 mL/min    Comment: (NOTE) Calculated using the CKD-EPI Creatinine Equation (2021)    Anion gap 13 5 - 15    Comment: Performed at [facility], [address], [place], [place] [address]  Magnesium     Status: Abnormal   Collection Time: 12/22/21  1:42 PM  Result Value Ref Range   Magnesium 2.8 (H) 1.7 - 2.4 mg/dL    Comment: Performed at [facility], [address] [place], [place] [address]  Ammonia     Status: None   Collection Time: 12/22/21  1:42 PM  Result Value Ref Range   Ammonia 14 9 - 35 umol/L    Comment: Performed at [facility], [address] [place], [place] [address]  Lactic acid, plasma     Status: None   Collection Time: 12/22/21  1:42 PM  Result Value Ref Range   Lactic Acid, Venous 1.1 0.5 - 1.9 mmol/L    Comment: Performed at [facility], [address] [place], [place] [address]  Brain natriuretic peptide     Status: None   Collection Time: 12/22/21  1:42 PM  Result Value Ref  Range   B Natriuretic Peptide 38.0 0.0 - 100.0 pg/mL    Comment: Performed at [facility], [address], [place], [place] [address]  POC CBG, ED     Status: None   Collection Time: 12/22/21  1:54 PM  Result Value Ref Range   Glucose-Capillary 78 70 - 99 mg/dL    Comment: Glucose reference range applies only to samples taken after fasting for at least 8 hours.  Blood culture (routine x 2)     Status: None (Preliminary result)   Collection Time: 12/22/21  2:49 PM   Specimen: Right Antecubital; Blood  Result Value Ref Range   Specimen Description RIGHT ANTECUBITAL    Special Requests      BOTTLES DRAWN AEROBIC AND ANAEROBIC Blood Culture results may not be optimal due to an inadequate volume of blood received in culture bottles   Culture      NO GROWTH < 12 HOURS Performed at [facility], [address], [place], [place] [address]    Report Status PENDING   Blood culture (routine x 2)     Status: None (Preliminary result)   Collection Time: 12/22/21  2:49 PM   Specimen: BLOOD RIGHT WRIST  Result Value Ref Range   Specimen Description BLOOD RIGHT WRIST    Special Requests      BOTTLES DRAWN AEROBIC AND ANAEROBIC  Blood Culture adequate volume   Culture      NO GROWTH < 12 HOURS Performed at [facility], [address] [place], [place] [address]    Report Status PENDING   Troponin I (High Sensitivity)     Status: None   Collection Time: 12/22/21  2:49 PM  Result Value Ref Range   Troponin I (High Sensitivity) 5 <18 ng/L    Comment: (NOTE) Elevated high sensitivity troponin I (hsTnI) values and significant  changes across serial measurements may suggest ACS but many other  chronic and acute conditions are known to elevate hsTnI results.  Refer to the "Links" section for chest pain algorithms and additional  guidance. Performed at [facility], [address], [place], [place] [address]   Troponin I (High Sensitivity)     Status: None   Collection Time: 12/22/21  4:32 PM  Result Value Ref Range   Troponin I (High Sensitivity) 5 <18 ng/L    Comment: (NOTE) Elevated high sensitivity troponin I (hsTnI) values and significant  changes across serial measurements may suggest ACS but many other  chronic and acute conditions are known to elevate hsTnI results.  Refer to the "Links" section for chest pain algorithms and additional  guidance. Performed at [facility], [address], [place], [place] [address]   CBG monitoring, ED     Status: None   Collection Time: 12/22/21  6:14 PM  Result Value Ref Range   Glucose-Capillary 93 70 - 99 mg/dL    Comment: Glucose reference range applies only to samples taken after fasting for at least 8 hours.  Glucose, capillary     Status: None   Collection Time: 12/22/21  7:13 PM  Result Value Ref Range   Glucose-Capillary 93 70 - 99 mg/dL    Comment: Glucose reference range applies only to samples taken after fasting for at least 8 hours.  MRSA Next Gen by PCR, Nasal     Status: None   Collection Time: 12/22/21  7:43 PM   Specimen: Nasal Mucosa; Nasal Swab  Result Value Ref Range   MRSA by PCR Next Gen NOT  DETECTED NOT DETECTED    Comment: (NOTE) The  GeneXpert MRSA Assay (FDA approved for NASAL specimens only), is one component of a comprehensive MRSA colonization surveillance program. It is not intended to diagnose MRSA infection nor to guide or monitor treatment for MRSA infections. Test performance is not FDA approved in patients less than 3 years old. Performed at [facility], [place] [address], [place], [place] [address]   CBC     Status: Abnormal   Collection Time: 12/22/21  9:23 PM  Result Value Ref Range   WBC 41.8 (H) 4.0 - 10.5 K/uL   RBC 3.00 (L) 3.87 - 5.11 MIL/uL   Hemoglobin 8.0 (L) 12.0 - 15.0 g/dL   HCT 26.7 (L) 36.0 - 46.0 %   MCV 89.0 80.0 - 100.0 fL   MCH 26.7 26.0 - 34.0 pg   MCHC 30.0 30.0 - 36.0 g/dL   RDW 16.9 (H) 11.5 - 15.5 %   Platelets 420 (H) 150 - 400 K/uL   nRBC 0.0 0.0 - 0.2 %    Comment: Performed at [facility] [address], [place], [place] [address]  Basic metabolic panel     Status: Abnormal   Collection Time: 12/22/21 10:14 PM  Result Value Ref Range   Sodium 132 (L) 135 - 145 mmol/L   Potassium 4.9 3.5 - 5.1 mmol/L   Chloride 107 98 - 111 mmol/L   CO2 16 (L) 22 - 32 mmol/L   Glucose, Bld 110 (H) 70 - 99 mg/dL    Comment: Glucose reference range applies only to samples taken after fasting for at least 8 hours.   BUN 72 (H) 8 - 23 mg/dL   Creatinine, Ser 1.67 (H) 0.44 - 1.00 mg/dL   Calcium 8.1 (L) 8.9 - 10.3 mg/dL   GFR, Estimated 32 (L) >60 mL/min    Comment: (NOTE) Calculated using the CKD-EPI Creatinine Equation (2021)    Anion gap 9 5 - 15    Comment: Performed at [facility] [address], [place], [place] [address]  Magnesium     Status: Abnormal   Collection Time: 12/22/21 10:14 PM  Result Value Ref Range   Magnesium 2.5 (H) 1.7 - 2.4 mg/dL    Comment: Performed at [facility] [address] [place], [place] [address]  Procalcitonin     Status: None   Collection Time: 12/22/21 10:14 PM  Result Value Ref Range   Procalcitonin 0.84 ng/mL     Comment:        Interpretation: PCT > 0.5 ng/mL and <= 2 ng/mL: Systemic infection (sepsis) is possible, but other conditions are known to elevate PCT as well. (NOTE)       Sepsis PCT Algorithm           Lower Respiratory Tract                                      Infection PCT Algorithm    ----------------------------     ----------------------------         PCT < 0.25 ng/mL                PCT < 0.10 ng/mL          Strongly encourage             Strongly discourage   discontinuation of antibiotics    initiation  of antibiotics    ----------------------------     -----------------------------       PCT 0.25 - 0.50 ng/mL            PCT 0.10 - 0.25 ng/mL               OR       >80% decrease in PCT            Discourage initiation of                                            antibiotics      Encourage discontinuation           of antibiotics    ----------------------------     -----------------------------         PCT >= 0.50 ng/mL              PCT 0.26 - 0.50 ng/mL                AND       <80% decrease in PCT             Encourage initiation of                                             antibiotics       Encourage continuation           of antibiotics    ----------------------------     -----------------------------        PCT >= 0.50 ng/mL                  PCT > 0.50 ng/mL               AND         increase in PCT                  Strongly encourage                                      initiation of antibiotics    Strongly encourage escalation           of antibiotics                                     -----------------------------                                           PCT <= 0.25 ng/mL                                                 OR                                        >  80% decrease in PCT                                      Discontinue / Do not initiate                                             antibiotics  Performed at [facility], [place] [address] [place], [place] [address]   TSH     Status: None   Collection Time: 12/22/21 10:14 PM  Result Value Ref Range   TSH 3.775 0.350 - 4.500 uIU/mL    Comment: Performed by a 3rd Generation assay with a functional sensitivity of <=0.01 uIU/mL. Performed at [facility], [place] [address] [place], [place] [address]   Glucose, capillary     Status: Abnormal   Collection Time: 12/22/21 11:31 PM  Result Value Ref Range   Glucose-Capillary 118 (H) 70 - 99 mg/dL    Comment: Glucose reference range applies only to samples taken after fasting for at least 8 hours.  CBC     Status: Abnormal   Collection Time: 12/23/21  3:10 AM  Result Value Ref Range   WBC 31.2 (H) 4.0 - 10.5 K/uL   RBC 2.60 (L) 3.87 - 5.11 MIL/uL   Hemoglobin 7.0 (L) 12.0 - 15.0 g/dL   HCT 23.0 (L) 36.0 - 46.0 %   MCV 88.5 80.0 - 100.0 fL   MCH 26.9 26.0 - 34.0 pg   MCHC 30.4 30.0 - 36.0 g/dL   RDW 16.9 (H) 11.5 - 15.5 %   Platelets 442 (H) 150 - 400 K/uL   nRBC 0.0 0.0 - 0.2 %    Comment: Performed at [facility] [address], [place], [place] [address]  Basic metabolic panel     Status: Abnormal   Collection Time: 12/23/21  3:10 AM  Result Value Ref Range   Sodium 133 (L) 135 - 145 mmol/L   Potassium 4.6 3.5 - 5.1 mmol/L   Chloride 106 98 - 111 mmol/L   CO2 12 (L) 22 - 32 mmol/L   Glucose, Bld 136 (H) 70 - 99 mg/dL    Comment: Glucose reference range applies only to samples taken after fasting for at least 8 hours.   BUN 64 (H) 8 - 23 mg/dL   Creatinine, Ser 1.46 (H) 0.44 - 1.00 mg/dL   Calcium 8.3 (L) 8.9 - 10.3 mg/dL   GFR, Estimated 38 (L) >60 mL/min    Comment: (NOTE) Calculated using the CKD-EPI Creatinine Equation (2021)    Anion gap 15 5 - 15    Comment: Performed at [facility] [address], [place], [place] [address]  Magnesium     Status: None   Collection Time: 12/23/21  3:10 AM  Result Value Ref Range   Magnesium 2.4 1.7 - 2.4 mg/dL    Comment: Performed at [facility] [address], [place], [place] [address]  Phosphorus     Status: None   Collection Time: 12/23/21  3:10 AM  Result Value Ref Range   Phosphorus 4.4 2.5 - 4.6 mg/dL    Comment: Performed at [facility] [address] [place], [place] [address]  Glucose, capillary     Status:  Abnormal   Collection Time: 12/23/21  3:48 AM  Result Value Ref Range   Glucose-Capillary 114 (H) 70 - 99 mg/dL    Comment: Glucose reference range applies only to samples taken after fasting for at least 8 hours.  Glucose, capillary     Status: Abnormal   Collection Time: 12/23/21  7:26 AM  Result Value Ref Range   Glucose-Capillary 108 (H) 70 - 99 mg/dL    Comment: Glucose reference range applies only to samples taken after fasting for at least 8 hours.   DG Chest Port 1 View  Result Date: 12/23/2021 CLINICAL DATA:  Respiratory failure EXAM: PORTABLE CHEST 1 VIEW COMPARISON:  Radiograph 12/22/2021 FINDINGS: Left neck approach catheter tip overlies the proximal SVC. Unchanged cardiomediastinal silhouette with unchanged tortuosity of the aorta. New bandlike left basilar opacity, likely atelectasis. No large effusion. No evidence of pneumothorax. Bones are unchanged. IMPRESSION: New bandlike left basilar opacity, favored to be atelectasis. Continued radiographic follow-up recommended. Electronically Signed   By: Maurine Simmering M.D.   On: 12/23/2021 08:07   DG CHEST PORT 1 VIEW  Result Date: 12/22/2021 CLINICAL DATA:  Central line EXAM: PORTABLE CHEST 1 VIEW COMPARISON:  Chest x-ray 12/22/2021 FINDINGS: There is a new left-sided central venous catheter with distal tip projecting over the brachiocephalic SVC Snyder. The aorta is tortuous, unchanged. Heart size is within normal limits. There is no focal lung infiltrate, pleural effusion or pneumothorax. No acute fractures are seen. IMPRESSION: New left-sided central venous catheter with distal tip projecting over the brachiocephalic SVC Snyder. No  pneumothorax. Electronically Signed   By: Ronney Asters M.D.   On: 12/22/2021 22:49   DG Chest Port 1 View  Result Date: 12/22/2021 CLINICAL DATA:  Altered mental status and cough. EXAM: PORTABLE CHEST 1 VIEW COMPARISON:  Chest radiograph 11/15/2021 FINDINGS: Patient appears to be slightly rotated towards the right. Heart and mediastinum are within normal limits and stable. Slightly coarse lung markings are suggestive for chronic changes. No focal airspace disease or pulmonary edema. Mild widening at the bilateral AC joints. Negative for a pneumothorax. IMPRESSION: No acute chest findings. Electronically Signed   By: Markus Daft M.D.   On: 12/22/2021 13:29     Assessment/Plan This is a 76 year old female who [facility] [facility] with septic shock-suspected source recurrent urinary tract infection versus decubitus wound.   Decubitus wound, draining stool. ?Coloatmospheric/colocutaneous fistula -The patient is afebrile, white blood cell count 31 today -The patient has no visible or palpable anus on exam, there are also no gross signs of infection from her wound.  Recommend CT of the abdomen and pelvis with IV contrast.  We will place NG tube for water-soluble oral contrast in attempts to better visualize her colonic anatomy, as I do not see a reliable lumen for rectal contrast. -No emergent surgical needs at this time.  CCS will follow closely. -WOC RN consulted to place Eakin pouch on the wound to collect stool and protect periwound. - may end up benefiting from diverting ostomy   Per primary team-- Septic shock --on Levophed; blood cultures and urine cultures are pending Acute kidney injury --BUN 64, creatinine 1.46 Acute on chronic respiratory failure, COPD DM2 Hypothyroidism GERD History of paraplegia   FEN -currently n.p.o. due to mental status VTE - SCDs, okay for DVT prophylaxis from a surgical perspective ID - meropenem Admit -ICU, CCM service  I reviewed nursing  notes, hospitalist notes, last 24 h vitals and pain scores, last 48 h intake  and output, last 24 h labs and trends, and last 24 h imaging results.  Jill Alexanders, [facility] Surgery 12/23/2021, 8:27 AM Please see Amion for pager number during day hours 7:00am-4:30pm or 7:00am -11:30am on weekends

## 2021-12-23 NOTE — Consult Note (Signed)
WOC Nurse Consult Note: Reason for Consult: ?Perirectal unstageable pressure injury, present on admission.  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  Wound bed: 25% gray slough, 75% nongranulating or mucosal (rectal) tissue.  Unclear etiology - awaiting CT  Drainage (amount, consistency, odor)  Copious thick stool oozing from wound bed.  Also tan, watery drainage. Periwound: Generalized erythema and denuded skin to buttocks, perineum and posterior thighs.  Consistent with prolonged exposure to urinary and fecal incontinence.  Will implement barrier cream, low air loss mattress (already in place) and barrier cream.  Legs are completely flaccid and feet are pointed outwards.  Protective heel foam dressings in place and pillow under calves to  float heels.  Due to anatomy and leg presentation, I do not think Prevalon boots would offload pressure.   Dressing procedure/placement/frequency: Cleanse coccyx wound with Soap and water and pat dry. Apply skin protectant and barrier ring around opening.  Apply rectal pouch (LAWSON # [id number])  Cut spout off bottom and close with clamp (LAWSON # [id number]).  Change twice weekly and PRN leakage.    Keep buttocks and perineal skin clean and dry. Apply moisture barrier ointment twice daily and PRN soilage.  No disposable pads or briefs.  [place] team  will see every 7-10 days and await CT results.  Domenic Moras MSN, RN, FNP-BC [facility] Wound, Ostomy, Continence Nurse Pager [phone]

## 2021-12-23 NOTE — Progress Notes (Signed)
Contacted patient's daughter [name] to inform of her transfer to [facility].

## 2021-12-23 NOTE — Significant Event (Signed)
Care Contact Phone Call  Camden F Narvaez's daughter, Carmie End, at [phone]. They were updated on Bo F Mizrachi's clinical status and plan.  Redmond [employer]., MSN, APRN, AGACNP-BC [facility]  12/23/2021 , 12:47 PM  Please see [email or web address] for pager details  If no response, please call [phone] After hours, please call Elink at [phone]

## 2021-12-23 NOTE — Evaluation (Signed)
Physical Therapy Evaluation Patient Details Name: NASRA COUNCE MRN: [id number] DOB: [birth year] Today's Date: 12/23/2021  History of Present Illness  pt is a 76 y/o female admitted as a transfer from [facility] via [facility] with AMS,  unstageable decubitus ulcers and cloudy urine.  Work up dx is septic shock due to urosepsis vs wound infection and acute respiratory failure with hypoxia.  PMHx:  bipolar d/o, COPD, DM, HTN, Paraplegic spinal paralysis.  Clinical Impression  Pt is at or close to baseline functioning and is general a dependent care level patient.  She should be no more difficult to care for than usuall. There are no further acute PT needs.  Will sign off at this time.        Recommendations for follow up therapy are one component of a multi-disciplinary discharge planning process, led by the attending physician.  Recommendations may be updated based on patient status, additional functional criteria and insurance authorization.  Follow Up Recommendations Long-term institutional care without follow-up therapy Can patient physically be transported by private vehicle: No    Assistance Recommended at Discharge Frequent or constant Supervision/Assistance  Patient can return home with the following  A lot of help with bathing/dressing/bathroom    Equipment Recommendations None recommended by PT  Recommendations for Other Services       Functional Status Assessment Patient has not had a recent decline in their functional status     Precautions / Restrictions Precautions Precautions:  (skin breakdown)      Mobility  Bed Mobility Overal bed mobility: Needs Assistance Bed Mobility: Rolling Rolling: Total assist         General bed mobility comments: LE's positioned dependently and pt cued and assisted via trunk/opposite arm to roll.  Pt unable to hold sidelying.    Transfers                   General transfer comment: unable    Ambulation/Gait                   Stairs            Wheelchair Mobility    Modified Rankin (Stroke Patients Only)       Balance Overall balance assessment:  (not able)                                           Pertinent Vitals/Pain Pain Assessment Pain Assessment: Faces Faces Pain Scale: Hurts a little bit Pain Location: shd pain R with AAROM Pain Intervention(s): Monitored during session    Home Living Family/patient expects to be discharged to:: Skilled nursing facility                        Prior Function Prior Level of Function : Other (comment) (Likely dependent care, though pt unable to relate PLOF)                     Hand Dominance        Extremity/Trunk Assessment   Upper Extremity Assessment Upper Extremity Assessment: Generalized weakness;RUE deficits/detail;LUE deficits/detail RUE Coordination: decreased fine motor LUE Coordination: decreased fine motor    Lower Extremity Assessment Lower Extremity Assessment:  (No voluntary/spontaneous movement, restin in hip flex/severe ER and knee flexion, soft contracture.)       Communication   Communication: No  difficulties  Cognition Arousal/Alertness: Lethargic, Awake/alert Behavior During Therapy: Flat affect Overall Cognitive Status: No family/caregiver present to determine baseline cognitive functioning (pt is confused, poor historian, unable to stay on task.)                                          General Comments General comments (skin integrity, edema, etc.): Questioning of pt suggests that she is bedbound, does not get OOB and is dependent with care, though she assists with UE's    Exercises Other Exercises Other Exercises: bicep/tricep presses AAROM, LE PROM   Assessment/Plan    PT Assessment Patient does not need any further PT services  PT Problem List         PT Treatment Interventions      PT Goals (Current goals can be found in the Care  Plan section)  Acute Rehab PT Goals PT Goal Formulation: All assessment and education complete, DC therapy    Frequency       Co-evaluation               AM-PAC PT "6 Clicks" Mobility  Outcome Measure Help needed turning from your back to your side while in a flat bed without using bedrails?: Total Help needed moving from lying on your back to sitting on the side of a flat bed without using bedrails?: Total Help needed moving to and from a bed to a chair (including a wheelchair)?: Total Help needed standing up from a chair using your arms (e.g., wheelchair or bedside chair)?: Total Help needed to walk in hospital room?: Total Help needed climbing 3-5 steps with a railing? : Total 6 Click Score: 6    End of Session   Activity Tolerance: Patient tolerated treatment well Patient left: in bed;with call bell/phone within reach Nurse Communication: Other (comment) (Related that we have nothing to offer Ms Flight at this time.) PT Visit Diagnosis: Other symptoms and signs involving the nervous system (R29.898);Muscle weakness (generalized) (M62.81)    Time: 3825-0539 PT Time Calculation (min) (ACUTE ONLY): 16 min   Charges:   PT Evaluation $PT Eval Moderate Complexity: 1 Mod          12/23/2021  Ginger Carne., PT Acute Rehabilitation Services [phone]  (office)  Tessie Fass Kedron Uno 12/23/2021, 11:43 AM

## 2021-12-23 NOTE — Progress Notes (Signed)
PCCM Interval Progress Note  Notified by Bobby Rumpf RN regarding completion of CT abdomen pelvis with contrast.  Radiology reads as below:  1. Bowel wall thickening of the rectosigmoid colon consistent with proctocolitis. 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 16 mm low-density area in the rectal wall along the left lateral aspect likely reflecting a small intramural abscess. 2. Severe right hydronephrosis. Right nephrolithiasis with the largest in the renal pelvis measuring 3.6 cm. 4 mm right mid ureteral calculus. 7 mm right distal ureteral calculus.  Dr. Zenia Resides with general surgery contacted regarding possible intramural abscess and colocutaneous fistula.  No urgent general surgery intervention per Dr. Zenia Resides.  Dr. Zenia Resides stated that they would follow-up with their colorectal surgery colleagues for evaluation of the patient.  Follow-up colorectal evaluation.  Dr. Kathlene Cote with IR consulted regarding severe right hydronephrosis.  Discussed case with physician.  IR to see patient for potential placement of neph tube.  Dr. Kathlene Cote requested urology consultation for stones.  Dr. Abner Greenspan with urology consulted for right nephrolithiasis.  To see patient.  Plan -Continue meropenem and linezolid.  Follow-up cultures. -Goal MAP 65.  Continue Levophed -Follow-up general surgery and colorectal surgery evaluation. -Follow-up IR intervention -Follow-up urology recommendations. -Continue Foley -Continue LR at [address]  Redmond School., MSN, APRN, AGACNP-BC [facility]  12/23/2021 , 3:11 PM  Please see [email or web address] for pager details  If no response, please call [phone] After hours, please call Elink at [phone]

## 2021-12-23 NOTE — Progress Notes (Signed)
Oral contract started '@1105'$ . PT prefers not to have NGT for contrast administration but states she understands that she needs to drink complete dose or will need to have NGT placed "for a short while" to complete the dose.

## 2021-12-23 NOTE — Consult Note (Signed)
Urology Consult   Physician requesting consult: Baltazar Apo, MD  Reason for consult: Renal and ureteral stones  History of Present Illness: [name] is a 76 y.o. with a past medical history significant for paraplegia, diabetes, obesity, COPD, bipolar 1, chronic decubitus ulcers, neurogenic bladder and nephrolithiasis who is seen in consultation for urolithiasis, obstruction and septic shock.  She follows with Dr. Felipa Eth in [place] and was seen in 01/2021 with history of neurogenic bladder and known large renal stones.  At that time he recommended CT urogram, cystoscopy and urodynamics however patient did not follow-up.  Patient is currently resting comfortably on her bed.  She was able to open her eyes and tell me that she is not in pain however she is unable to answer any further questions.  I called her daughter, [name] and further HPI was obtained through her.  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  She was diagnosed with neurogenic bladder and has been managed with a Foley catheter for the past 15 years.  Given her body habitus with severe obesity and large pannus, has been determined that suprapubic tube is unlikely to be an adequate option to manage her neurogenic bladder.  She was recently admitted and discharged in 10/2021 with sepsis due to presumed UTI.  No cross-sectional abdominal imaging was obtained during that admission.  She presented to the ED plan in the emergency department feeling fatigued, not taking much p.o. for concern for possible UTI.  She was transferred to [facility] yesterday evening and was started on broad-spectrum antibiotics and pressor support.  CT A/P was obtained on 12/23/2021 earlier this afternoon that demonstrated severe right hydronephrosis to the level of the distal ureter in which she was found to have a 4 mm right mid ureteral stone 7 mm right distal ureteral stone.   Also present is a known large right renal stone measuring 3.6 cm.  Of note, the stone burden has remained stable since 04/2020 when she was found to have a large 3.7 cm right renal stone.  Critical care had already consulted interventional radiology to place a right nephrostomy tube and this is scheduled for 12/24/2021 unless she decompensates prior.  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   Current Outpatient Medications on File Prior to Encounter  Medication Sig Dispense Refill   acetaminophen (TYLENOL) 325 MG tablet Take 325 mg by mouth 2 (two) times daily. *May take one every 6 hours as needed for pain     allopurinol (ZYLOPRIM) 100 MG tablet Take 100 mg by mouth daily.      Amino Acids-Protein Hydrolys (FEEDING SUPPLEMENT, PRO-STAT 64,) LIQD Take 30 mLs by mouth 3 (three) times daily with meals.     aspirin EC  81 MG tablet Take 81 mg by mouth daily.     atorvastatin (LIPITOR) 10 MG tablet Take 10 mg by mouth daily.     azelastine (ASTELIN) 0.1 % nasal spray Place 2 sprays into both nostrils 2 (two) times daily. Use in each nostril as directed     b complex vitamins tablet Take 1 tablet by mouth daily.     buPROPion (WELLBUTRIN XL) 300 MG 24 hr tablet Take 300 mg by mouth daily.      Calcium Carb-Cholecalciferol (CALCIUM CARBONATE-VITAMIN D3 PO) Take 1 tablet by mouth in  the morning and at bedtime.     carvedilol (COREG) 12.5 MG tablet Take 12.5 mg by mouth 2 (two) times daily with a meal.     Cranberry 500 MG TABS Take 1 capsule by mouth daily.     dextromethorphan-guaiFENesin (MUCINEX DM) 30-600 MG per 12 hr tablet Take 1 tablet by mouth every 12 (twelve) hours as needed for cough.      docusate sodium (COLACE) 100 MG capsule Take 100 mg by mouth at bedtime.     ezetimibe (ZETIA) 10 MG tablet Take 10 mg by mouth at bedtime.      famotidine (PEPCID) 20 MG tablet Take 20 mg by mouth 2 (two) times daily.     ferrous sulfate 325 (65 FE) MG EC tablet Take 325 mg by mouth 3 (three) times daily with meals.     fluticasone (FLONASE) 50 MCG/ACT nasal spray Place 2 sprays into both nostrils daily.      furosemide (LASIX) 20 MG tablet Take 1 tablet (20 mg total) by mouth daily. 30 tablet 3   gabapentin (NEURONTIN) 300 MG capsule Take 300 mg by mouth every 8 (eight) hours.     ipratropium-albuterol (DUONEB) 0.5-2.5 (3) MG/3ML SOLN Take 3 mLs by nebulization every 6 (six) hours as needed (shortness of breath). 360 mL 2   Krill Oil 300 MG CAPS Take 1 capsule by mouth 2 (two) times daily.     Lactobacillus (ACIDOPHILUS PO) Take 1 tablet by mouth in the morning and at bedtime.     levothyroxine (SYNTHROID) 150 MCG tablet Take 175 mcg by mouth daily.     lisinopril (ZESTRIL) 2.5 MG tablet Take 2.5 mg by mouth daily.     magnesium oxide (MAG-OX) 400 MG tablet Take 400 mg by mouth daily.     Menthol-Zinc Oxide (CALMOSEPTINE) 0.44-20.6 % OINT Apply 1 Application topically every 8 (eight) hours as needed (protection/prevention).     mirabegron ER (MYRBETRIQ) 25 MG TB24 tablet Take 25 mg by mouth daily.     Multiple Vitamins-Minerals (CERTAGEN PO) Take 1 tablet by mouth daily.     Nutritional Supplements (ARGINAID) PACK Take 1 packet by mouth daily.     oxycodone (OXY-IR) 5 MG capsule Take 1 capsule (5 mg total) by mouth in the morning and at bedtime. 10 capsule 0   pantoprazole  (PROTONIX) 40 MG tablet Take 1 tablet (40 mg total) by mouth daily. 30 tablet 2   potassium chloride 20 MEQ TBCR Take 10 mEq by mouth daily.     tamsulosin (FLOMAX) 0.4 MG CAPS capsule Take 1 capsule (0.4 mg total) by mouth daily after supper. 30 capsule 3   topiramate (TOPAMAX) 50 MG tablet Take 50 mg by mouth 2 (two) times daily.     vitamin C (ASCORBIC ACID) 500 MG tablet Take 500 mg by mouth 2 (two) times daily.     Vitamin D, Ergocalciferol, (DRISDOL) 1.25 MG (50000  UNIT) CAPS capsule Take 50,000 Units by mouth every 7 (seven) days.     leptospermum manuka honey (MEDIHONEY) PSTE paste Apply 1 Application topically daily. Apply Medihoney to right and left inner  and outer thigh wounds and left lower buttock Q day, then cover with foam dressing.  Change foam dressings Q 3 days or PRN soiling. 2. Apply moist gauze packing to right middle buttock Q day, using swab to fill, then cover with foam dressing.  (Change foam dressing Q 3 days or PRN soiling.) 2. Foam dressings to right inner groin, left buttock, right lower buttock, right upper thigh.  Change Q 3 days or PRN soiling. Apply thin layer (3 mm) to wound. (Patient not taking: Reported on 12/22/2021) 44 mL 3   SANTYL 250 UNIT/GM ointment Apply 1 Application topically daily.     Wound Dressings (PROMOGRAN EX) Apply topically daily.       Scheduled Meds:  aspirin  81 mg Oral Daily   atorvastatin  10 mg Oral Daily   Chlorhexidine Gluconate Cloth  6 each Topical Daily   heparin  5,000 Units Subcutaneous Q8H   levothyroxine  175 mcg Oral Q0600   mouth rinse  15 mL Mouth Rinse 4 times per day   pantoprazole  40 mg Oral Daily   sodium chloride flush  10-40 mL Intracatheter Q12H   Continuous Infusions:  sodium chloride Stopped (12/22/21 2250)   lactated ringers 75 mL/hr at 12/23/21 1500   [START ON 12/24/2021] levofloxacin (LEVAQUIN) IV     linezolid (ZYVOX) IV Stopped (12/23/21 1024)   meropenem (MERREM) IV Stopped (12/23/21 0516)    norepinephrine (LEVOPHED) Adult infusion 6 mcg/min (12/23/21 1500)   PRN Meds:.docusate sodium, mouth rinse, polyethylene glycol, sodium chloride flush  Allergies:  Allergies  Allergen Reactions   Invanz [Ertapenem] Other (See Comments)    Patient states allergy but is not certain    Propranolol Hcl    Rocephin [Ceftriaxone] Hives   Vancomycin Hives    Family History  Adopted: Yes  Problem Relation Age of Onset   Heart disease Mother     Social History:  reports that she has quit smoking. She has never used smokeless tobacco. She reports that she does not currently use alcohol. She reports that she does not use drugs.  ROS: A complete review of systems was performed.  All systems are negative except for pertinent findings as noted.  Physical Exam:  Vital signs in last 24 hours: Temp:  [96 F (35.6 C)-98.1 F (36.7 C)] 96 F (35.6 C) (09/28 1541) Pulse Rate:  [73-99] 73 (09/28 1600) Resp:  [9-24] 12 (09/28 1600) BP: (63-137)/(42-126) 102/52 (09/28 1600) SpO2:  [82 %-100 %] 98 % (09/28 1600) Weight:  [97.3 kg-102.1 kg] 102.1 kg (09/28 0441) Constitutional:  Arousable Cardiovascular: Regular rate and rhythm Respiratory: Normal respiratory effort, Lungs clear bilaterally GI: Abdomen is soft, nontender.  She is morbidly obese with very large pannus GU: No CVA tenderness Neurologic: Grossly intact, no focal deficits  Laboratory Data:  Recent Labs    12/22/21 1342 12/22/21 2123 12/23/21 0310  WBC 35.7* 41.8* 31.2*  HGB 7.1* 8.0* 7.0*  HCT 23.8* 26.7* 23.0*  PLT 470* 420* 442*    Recent Labs    12/22/21 1342 12/22/21 2214 12/23/21 0310  NA 127* 132* 133*  K 5.9* 4.9 4.6  CL 102 107 106  GLUCOSE 80 110* 136*  BUN 83* 72* 64*  CALCIUM 8.2* 8.1* 8.3*  CREATININE 1.72* 1.67* 1.46*  Results for orders placed or performed during the hospital encounter of 12/22/21 (from the past 24 hour(s))  CBG monitoring, ED     Status: None   Collection Time: 12/22/21   6:14 PM  Result Value Ref Range   Glucose-Capillary 93 70 - 99 mg/dL  Glucose, capillary     Status: None   Collection Time: 12/22/21  7:13 PM  Result Value Ref Range   Glucose-Capillary 93 70 - 99 mg/dL  MRSA Next Gen by PCR, Nasal     Status: None   Collection Time: 12/22/21  7:43 PM   Specimen: Nasal Mucosa; Nasal Swab  Result Value Ref Range   MRSA by PCR Next Gen NOT DETECTED NOT DETECTED  CBC     Status: Abnormal   Collection Time: 12/22/21  9:23 PM  Result Value Ref Range   WBC 41.8 (H) 4.0 - 10.5 K/uL   RBC 3.00 (L) 3.87 - 5.11 MIL/uL   Hemoglobin 8.0 (L) 12.0 - 15.0 g/dL   HCT 26.7 (L) 36.0 - 46.0 %   MCV 89.0 80.0 - 100.0 fL   MCH 26.7 26.0 - 34.0 pg   MCHC 30.0 30.0 - 36.0 g/dL   RDW 16.9 (H) 11.5 - 15.5 %   Platelets 420 (H) 150 - 400 K/uL   nRBC 0.0 0.0 - 0.2 %  Basic metabolic panel     Status: Abnormal   Collection Time: 12/22/21 10:14 PM  Result Value Ref Range   Sodium 132 (L) 135 - 145 mmol/L   Potassium 4.9 3.5 - 5.1 mmol/L   Chloride 107 98 - 111 mmol/L   CO2 16 (L) 22 - 32 mmol/L   Glucose, Bld 110 (H) 70 - 99 mg/dL   BUN 72 (H) 8 - 23 mg/dL   Creatinine, Ser 1.67 (H) 0.44 - 1.00 mg/dL   Calcium 8.1 (L) 8.9 - 10.3 mg/dL   GFR, Estimated 32 (L) >60 mL/min   Anion gap 9 5 - 15  Magnesium     Status: Abnormal   Collection Time: 12/22/21 10:14 PM  Result Value Ref Range   Magnesium 2.5 (H) 1.7 - 2.4 mg/dL  Procalcitonin     Status: None   Collection Time: 12/22/21 10:14 PM  Result Value Ref Range   Procalcitonin 0.84 ng/mL  TSH     Status: None   Collection Time: 12/22/21 10:14 PM  Result Value Ref Range   TSH 3.775 0.350 - 4.500 uIU/mL  Glucose, capillary     Status: Abnormal   Collection Time: 12/22/21 11:31 PM  Result Value Ref Range   Glucose-Capillary 118 (H) 70 - 99 mg/dL  CBC     Status: Abnormal   Collection Time: 12/23/21  3:10 AM  Result Value Ref Range   WBC 31.2 (H) 4.0 - 10.5 K/uL   RBC 2.60 (L) 3.87 - 5.11 MIL/uL   Hemoglobin  7.0 (L) 12.0 - 15.0 g/dL   HCT 23.0 (L) 36.0 - 46.0 %   MCV 88.5 80.0 - 100.0 fL   MCH 26.9 26.0 - 34.0 pg   MCHC 30.4 30.0 - 36.0 g/dL   RDW 16.9 (H) 11.5 - 15.5 %   Platelets 442 (H) 150 - 400 K/uL   nRBC 0.0 0.0 - 0.2 %  Basic metabolic panel     Status: Abnormal   Collection Time: 12/23/21  3:10 AM  Result Value Ref Range   Sodium 133 (L) 135 - 145 mmol/L   Potassium 4.6 3.5 - 5.1 mmol/L  Chloride 106 98 - 111 mmol/L   CO2 12 (L) 22 - 32 mmol/L   Glucose, Bld 136 (H) 70 - 99 mg/dL   BUN 64 (H) 8 - 23 mg/dL   Creatinine, Ser 1.46 (H) 0.44 - 1.00 mg/dL   Calcium 8.3 (L) 8.9 - 10.3 mg/dL   GFR, Estimated 38 (L) >60 mL/min   Anion gap 15 5 - 15  Magnesium     Status: None   Collection Time: 12/23/21  3:10 AM  Result Value Ref Range   Magnesium 2.4 1.7 - 2.4 mg/dL  Phosphorus     Status: None   Collection Time: 12/23/21  3:10 AM  Result Value Ref Range   Phosphorus 4.4 2.5 - 4.6 mg/dL  Glucose, capillary     Status: Abnormal   Collection Time: 12/23/21  3:48 AM  Result Value Ref Range   Glucose-Capillary 114 (H) 70 - 99 mg/dL  Glucose, capillary     Status: Abnormal   Collection Time: 12/23/21  7:26 AM  Result Value Ref Range   Glucose-Capillary 108 (H) 70 - 99 mg/dL  Glucose, capillary     Status: Abnormal   Collection Time: 12/23/21 11:47 AM  Result Value Ref Range   Glucose-Capillary 144 (H) 70 - 99 mg/dL  Glucose, capillary     Status: Abnormal   Collection Time: 12/23/21  3:38 PM  Result Value Ref Range   Glucose-Capillary 112 (H) 70 - 99 mg/dL   Recent Results (from the past 240 hour(s))  SARS Coronavirus 2 by RT PCR (hospital order, performed in [place] hospital lab) *cepheid single result test* Anterior Nasal Swab     Status: None   Collection Time: 12/22/21  1:40 PM   Specimen: Anterior Nasal Swab  Result Value Ref Range Status   SARS Coronavirus 2 by RT PCR NEGATIVE NEGATIVE Final    Comment: (NOTE) SARS-CoV-2 target nucleic acids are NOT  DETECTED.  The SARS-CoV-2 RNA is generally detectable in upper and lower respiratory specimens during the acute phase of infection. The lowest concentration of SARS-CoV-2 viral copies this assay can detect is 250 copies / mL. A negative result does not preclude SARS-CoV-2 infection and should not be used as the sole basis for treatment or other patient management decisions.  A negative result may occur with improper specimen collection / handling, submission of specimen other than nasopharyngeal swab, presence of viral mutation(s) within the areas targeted by this assay, and inadequate number of viral copies (<250 copies / mL). A negative result must be combined with clinical observations, patient history, and epidemiological information.  Fact Sheet for Patients:   https://www.patel.info/  Fact Sheet for Healthcare Providers: https://hall.com/  This test is not yet approved or  cleared by the [place] FDA and has been authorized for detection and/or diagnosis of SARS-CoV-2 by FDA under an Emergency Use Authorization (EUA).  This EUA will remain in effect (meaning this test can be used) for the duration of the COVID-19 declaration under Section 564(b)(1) of the Act, 21 U.S.C. section 360bbb-3(b)(1), unless the authorization is terminated or revoked sooner.  Performed at [facility], [address] [place], [place] [address]   Blood culture (routine x 2)     Status: None (Preliminary result)   Collection Time: 12/22/21  2:49 PM   Specimen: Right Antecubital; Blood  Result Value Ref Range Status   Specimen Description RIGHT ANTECUBITAL  Final   Special Requests   Final    BOTTLES DRAWN AEROBIC AND ANAEROBIC  Blood Culture results may not be optimal due to an inadequate volume of blood received in culture bottles   Culture   Final    NO GROWTH < 12 HOURS Performed at [facility], [address], [place], [place] [address]    Report  Status PENDING  Incomplete  Blood culture (routine x 2)     Status: None (Preliminary result)   Collection Time: 12/22/21  2:49 PM   Specimen: BLOOD RIGHT WRIST  Result Value Ref Range Status   Specimen Description BLOOD RIGHT WRIST  Final   Special Requests   Final    BOTTLES DRAWN AEROBIC AND ANAEROBIC Blood Culture adequate volume   Culture   Final    NO GROWTH < 12 HOURS Performed at [facility], [address], [place], [place] [address]    Report Status PENDING  Incomplete  MRSA Next Gen by PCR, Nasal     Status: None   Collection Time: 12/22/21  7:43 PM   Specimen: Nasal Mucosa; Nasal Swab  Result Value Ref Range Status   MRSA by PCR Next Gen NOT DETECTED NOT DETECTED Final    Comment: (NOTE) The GeneXpert MRSA Assay (FDA approved for NASAL specimens only), is one component of a comprehensive MRSA colonization surveillance program. It is not intended to diagnose MRSA infection nor to guide or monitor treatment for MRSA infections. Test performance is not FDA approved in patients less than 56 years old. Performed at [facility], [place] [address], [place], [place] [address]     Renal Function: Recent Labs    12/22/21 1342 12/22/21 2214 12/23/21 0310  CREATININE 1.72* 1.67* 1.46*   Estimated Creatinine Clearance: 42.1 mL/min (A) (by C-G formula based on SCr of 1.46 mg/dL (H)).  Radiologic Imaging: CT ABDOMEN PELVIS W CONTRAST  Result Date: 12/23/2021 CLINICAL DATA:  Sacral wound leaking stool. EXAM: CT ABDOMEN AND PELVIS WITH CONTRAST TECHNIQUE: Multidetector CT imaging of the abdomen and pelvis was performed using the standard protocol following bolus administration of intravenous contrast. RADIATION DOSE REDUCTION: This exam was performed according to the departmental dose-optimization program which includes automated exposure control, adjustment of the mA and/or kV according to patient size and/or use of iterative reconstruction technique. CONTRAST:  23m  OMNIPAQUE IOHEXOL 350 MG/ML SOLN COMPARISON:  05/21/2020 FINDINGS: Lower chest: Trace bilateral pleural effusions. Bibasilar atelectasis. Hepatobiliary: Cirrhosis without focal hepatic mass. No gallstones, gallbladder wall thickening, or biliary dilatation. Pancreas: Unremarkable. No pancreatic ductal dilatation or surrounding inflammatory changes. Spleen: Normal in size without focal abnormality. Adrenals/Urinary Tract: Adrenal glands are unremarkable. Stable bilateral renal cysts for which no further evaluation is recommended. Severe right hydronephrosis. Right nephrolithiasis with the largest in the renal pelvis measuring 3.6 cm. 4 mm right mid ureteral calculus. 7 mm right distal ureteral calculus. Decompressed bladder. Foley catheter in the bladder. Stomach/Bowel: No bowel dilatation to suggest bowel obstruction. Bowel wall thickening of the rectosigmoid colon consistent with proctocolitis. 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 16 mm low-density area in the rectal wall along the left lateral aspect likely reflecting a small intramural abscess. Vascular/Lymphatic: Normal caliber abdominal aorta with mild atherosclerosis. No lymphadenopathy. Reproductive: Status post hysterectomy. No adnexal masses. Other: No abdominal wall hernia or abnormality. No abdominopelvic ascites. Sacral decubitus ulcer. Decubitus ulcer overlying the right ischial tuberosity. Generalized osteopenia. Mild osteoarthritis of bilateral SI joints. Mild osteoarthritis of the right hip. Musculoskeletal: No acute osseous abnormality. No aggressive osseous lesion. Diffuse thoracolumbar spondylosis. 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 IMPRESSION: 1. Bowel wall thickening of the rectosigmoid colon consistent with proctocolitis. 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 16 mm low-density area in the rectal wall along the left  lateral aspect likely reflecting a small intramural abscess. 2. Severe right hydronephrosis. Right nephrolithiasis with the largest in the renal pelvis measuring 3.6 cm. 4 mm right mid ureteral calculus. 7 mm right distal ureteral calculus. 3. Decubitus ulcer overlying the right ischial tuberosity. Electronically Signed   By: Kathreen Devoid M.D.   On: 12/23/2021 14:13   DG Chest Port 1 View  Result Date: 12/23/2021 CLINICAL DATA:  Respiratory failure EXAM: PORTABLE CHEST 1 VIEW COMPARISON:  Radiograph 12/22/2021 FINDINGS: Left neck approach catheter tip overlies the proximal SVC. Unchanged cardiomediastinal silhouette with unchanged tortuosity of the aorta. New bandlike left basilar opacity, likely atelectasis. No large effusion. No evidence of pneumothorax. Bones are unchanged. IMPRESSION: New bandlike left basilar opacity, favored to be atelectasis. Continued radiographic follow-up recommended. Electronically Signed   By: Maurine Simmering M.D.   On: 12/23/2021 08:07   DG CHEST PORT 1 VIEW  Result Date: 12/22/2021 CLINICAL DATA:  Central line EXAM: PORTABLE CHEST 1 VIEW COMPARISON:  Chest x-ray 12/22/2021 FINDINGS: There is a new left-sided central venous catheter with distal tip projecting over the brachiocephalic SVC junction. The aorta is tortuous, unchanged. Heart size is within normal limits. There is no focal lung infiltrate, pleural effusion or pneumothorax. No acute fractures are seen. IMPRESSION: New left-sided central venous catheter with distal tip projecting over the brachiocephalic SVC junction. No pneumothorax. Electronically Signed   By: Ronney Asters M.D.   On: 12/22/2021 22:49   DG Chest Port 1 View  Result Date: 12/22/2021 CLINICAL DATA:  Altered mental status and cough. EXAM: PORTABLE CHEST 1 VIEW COMPARISON:  Chest radiograph 11/15/2021 FINDINGS: Patient appears to be slightly rotated towards the right. Heart and mediastinum are within normal limits and stable. Slightly coarse lung markings  are suggestive for chronic changes. No focal airspace disease or pulmonary edema. Mild widening at the bilateral AC joints. Negative for a pneumothorax. IMPRESSION: No acute chest findings. Electronically Signed   By: Markus Daft M.D.   On: 12/22/2021 13:29    I independently reviewed the above imaging studies.  Impression/Recommendation: Sepsis secondary to obstructing right ureteral stones.  CT A/P 12/23/2021 with severe right hydronephrosis, 4 mm right mid ureteral stone and 7 mm right distal ureteral stone.  Also present is large 3.6 cm right renal stone.  Urine culture pending.  WBC 31.2 from 41.8 yesterday.  On broad-spectrum antibiotics.  Planning for right percutaneous nephrostomy tube placement.  -Reviewed her history and current imaging with evidence of obstructing right distal ureteral stones and large right renal stone.  -Interventional radiology has already been consulted by the primary team to place right nephrostomy tube.  This is tentatively scheduled for tomorrow morning unless she decompensates prior.  Appreciate assistance. -Continue broad-spectrum antibiotics.  Follow-up urine culture.  She remains afebrile.  Wean as able to culture specific antibiotics for treatment at least 2 weeks. -I discussed her case with her daughter.  She is followed in the [facility] clinic and prefers to have outpatient follow-up there as this is closer to her. -We discussed that she will require outpatient treatment of at least her ureteral stones.  I discussed that in order to treat her large renal stone, this would require percutaneous nephrolithotomy.  She would require medical clearance prior determine  if she would be able to tolerate this procedure.  We discussed that the stone has been present for years and at least since early 2022 at the time of her last [facility] clinic to arrange outpatient follow-up. -Following peripherally. Please call with  questions.  Matt R. Lavella Myren MD 12/23/2021, 4:42 PM  [facility]  Pager: [phone]   CC: Baltazar Apo, MD

## 2021-12-23 NOTE — Progress Notes (Signed)
Insertion of 16 F Coude cath per MD order successful,,with cloudy yellowish return , patient tolerated this well. Incontinent care provided by assigned nurse and staff

## 2021-12-24 ENCOUNTER — Inpatient Hospital Stay (HOSPITAL_COMMUNITY): Payer: Medicare Other

## 2021-12-24 DIAGNOSIS — A419 Sepsis, unspecified organism: Secondary | ICD-10-CM | POA: Diagnosis not present

## 2021-12-24 DIAGNOSIS — R6521 Severe sepsis with septic shock: Secondary | ICD-10-CM | POA: Diagnosis not present

## 2021-12-24 HISTORY — PX: IR NEPHROSTOMY PLACEMENT RIGHT: IMG6064

## 2021-12-24 LAB — BASIC METABOLIC PANEL
Anion gap: 7 (ref 5–15)
BUN: 36 mg/dL — ABNORMAL HIGH (ref 8–23)
CO2: 17 mmol/L — ABNORMAL LOW (ref 22–32)
Calcium: 8.2 mg/dL — ABNORMAL LOW (ref 8.9–10.3)
Chloride: 110 mmol/L (ref 98–111)
Creatinine, Ser: 0.73 mg/dL (ref 0.44–1.00)
GFR, Estimated: >60 mL/min (ref >60)
Glucose, Bld: 129 mg/dL — ABNORMAL HIGH (ref 70–99)
Potassium: 4.2 mmol/L (ref 3.5–5.1)
Sodium: 134 mmol/L — ABNORMAL LOW (ref 135–145)

## 2021-12-24 LAB — PROTIME-INR
INR: 1.4 — ABNORMAL HIGH (ref 0.8–1.2)
Prothrombin Time: 17.2 seconds — ABNORMAL HIGH (ref 11.4–15.2)

## 2021-12-24 LAB — CBC WITH DIFFERENTIAL/PLATELET
Abs Immature Granulocytes: 0.16 10*3/uL — ABNORMAL HIGH (ref 0.00–0.07)
Basophils Absolute: 0.1 10*3/uL (ref 0.0–0.1)
Basophils Relative: 0 %
Eosinophils Absolute: 0.1 10*3/uL (ref 0.0–0.5)
Eosinophils Relative: 1 %
HCT: 23.7 % — ABNORMAL LOW (ref 36.0–46.0)
Hemoglobin: 7.2 g/dL — ABNORMAL LOW (ref 12.0–15.0)
Immature Granulocytes: 1 %
Lymphocytes Relative: 9 %
Lymphs Abs: 1.6 10*3/uL (ref 0.7–4.0)
MCH: 26.4 pg (ref 26.0–34.0)
MCHC: 30.4 g/dL (ref 30.0–36.0)
MCV: 86.8 fL (ref 80.0–100.0)
Monocytes Absolute: 0.9 10*3/uL (ref 0.1–1.0)
Monocytes Relative: 5 %
Neutro Abs: 15.7 10*3/uL — ABNORMAL HIGH (ref 1.7–7.7)
Neutrophils Relative %: 84 %
Platelets: 444 10*3/uL — ABNORMAL HIGH (ref 150–400)
RBC: 2.73 MIL/uL — ABNORMAL LOW (ref 3.87–5.11)
RDW: 17.1 % — ABNORMAL HIGH (ref 11.5–15.5)
WBC: 18.5 10*3/uL — ABNORMAL HIGH (ref 4.0–10.5)
nRBC: 0 % (ref 0.0–0.2)

## 2021-12-24 LAB — URINALYSIS, ROUTINE W REFLEX MICROSCOPIC
Bilirubin Urine: NEGATIVE
Glucose, UA: NEGATIVE mg/dL
Ketones, ur: 5 mg/dL — AB
Nitrite: NEGATIVE
Protein, ur: 100 mg/dL — AB
Specific Gravity, Urine: 1.016 (ref 1.005–1.030)
WBC, UA: >50 WBC/hpf — ABNORMAL HIGH (ref 0–5)
pH: 5 (ref 5.0–8.0)

## 2021-12-24 LAB — GLUCOSE, CAPILLARY
Glucose-Capillary: 121 mg/dL — ABNORMAL HIGH (ref 70–99)
Glucose-Capillary: 114 mg/dL — ABNORMAL HIGH (ref 70–99)
Glucose-Capillary: 113 mg/dL — ABNORMAL HIGH (ref 70–99)
Glucose-Capillary: 102 mg/dL — ABNORMAL HIGH (ref 70–99)
Glucose-Capillary: 102 mg/dL — ABNORMAL HIGH (ref 70–99)
Glucose-Capillary: 124 mg/dL — ABNORMAL HIGH (ref 70–99)

## 2021-12-24 MED ORDER — MIDAZOLAM HCL 2 MG/2ML IJ SOLN
INTRAMUSCULAR | Status: AC | PRN
  Administered 2021-12-24: .5 mg via INTRAVENOUS

## 2021-12-24 MED ORDER — FENTANYL CITRATE (PF) 100 MCG/2ML IJ SOLN
INTRAMUSCULAR | Status: AC
  Filled 2021-12-24: qty 2

## 2021-12-24 MED ORDER — LIDOCAINE HCL 1 % IJ SOLN
INTRAMUSCULAR | Status: AC
  Filled 2021-12-24: qty 20

## 2021-12-24 MED ORDER — IOHEXOL 300 MG/ML  SOLN
50.0000 mL | Freq: Once | INTRAMUSCULAR | Status: AC | PRN
  Administered 2021-12-24: 15 mL

## 2021-12-24 MED ORDER — MIDAZOLAM HCL 2 MG/2ML IJ SOLN
INTRAMUSCULAR | Status: AC
  Filled 2021-12-24: qty 2

## 2021-12-24 MED ORDER — LACTATED RINGERS IV BOLUS
1000.0000 mL | Freq: Once | INTRAVENOUS | Status: AC
  Administered 2021-12-24: 1000 mL via INTRAVENOUS

## 2021-12-24 MED ORDER — LIDOCAINE-EPINEPHRINE 1 %-1:100000 IJ SOLN
INTRAMUSCULAR | Status: AC | PRN
  Administered 2021-12-24: 10 mL

## 2021-12-24 MED ORDER — FENTANYL CITRATE (PF) 100 MCG/2ML IJ SOLN
INTRAMUSCULAR | Status: AC | PRN
  Administered 2021-12-24: 25 ug via INTRAVENOUS

## 2021-12-24 MED ORDER — ACETAMINOPHEN 325 MG PO TABS
650.0000 mg | ORAL_TABLET | Freq: Four times a day (QID) | ORAL | Status: DC | PRN
  Administered 2021-12-24 – 2021-12-29 (×6): 650 mg via ORAL
  Filled 2021-12-24 (×6): qty 2

## 2021-12-24 MED ORDER — LIP MEDEX EX OINT
TOPICAL_OINTMENT | CUTANEOUS | Status: DC | PRN
  Filled 2021-12-24: qty 7

## 2021-12-24 MED ORDER — SODIUM CHLORIDE 0.9 % IV SOLN
1.0000 g | Freq: Three times a day (TID) | INTRAVENOUS | Status: DC
  Administered 2021-12-24 – 2021-12-27 (×9): 1 g via INTRAVENOUS
  Filled 2021-12-24 (×9): qty 20

## 2021-12-24 MED ORDER — IPRATROPIUM-ALBUTEROL 0.5-2.5 (3) MG/3ML IN SOLN: 3.0000 mL | Freq: Four times a day (QID) | RESPIRATORY_TRACT | Status: DC | PRN

## 2021-12-24 NOTE — Sedation Documentation (Signed)
Patient safely moved from IR table back to bed with assistance from staff. Patient denies any pain or discomfort after transfer to bed. Patient nurse remained in IR during procedure. Report given in IR suite. Patient cleaned and new rectal pouch placed prior to transfer back to 51M.

## 2021-12-24 NOTE — Progress Notes (Signed)
PHARMACY NOTE:  ANTIMICROBIAL RENAL DOSAGE ADJUSTMENT  Current antimicrobial regimen includes a mismatch between antimicrobial dosage and estimated renal function.  As per policy approved by the Pharmacy & Therapeutics and Medical Executive Committees, the antimicrobial dosage will be adjusted accordingly.  Current antimicrobial dosage:  Meropenem 1g IV Q12H   Indication: Anal necrosis with colocutaneous fistula and pyonephrosis 2/2 obstructing R ureteral stone s/p percutaneous nephrostomy placement.   Renal Function:  Estimated Creatinine Clearance: 76.9 mL/min (by C-G formula based on SCr of 0.73 mg/dL). '[]'$      On intermittent HD, scheduled: '[]'$      On CRRT    Antimicrobial dosage has been changed to:  Meropenem 1g IV Q8H   Additional comments:   Thank you for allowing pharmacy to be a part of this patient's care.  Adria Dill, PharmD PGY-2 Infectious Diseases Resident  12/24/2021 10:47 AM

## 2021-12-24 NOTE — Sedation Documentation (Signed)
Patient transferred from bed to IR table with assistance from staff members. Yellow cushions placed underneath patient's arms for comfort.

## 2021-12-24 NOTE — TOC Progression Note (Signed)
Transition of Care [facility]) - Initial/Assessment Note    Patient Details  Name: [name] MRN: [id number] Date of Birth: [birth year]  Transition of Care [facility]) CM/SW Contact:    Milinda Antis, [place] Phone Number: 12/24/2021, 3:58 PM  Clinical Narrative:                 TOC following patient for any d/c planning needs once medically stable.  Patient is from [facility].  CSW contacted [facility] to inquire about whether the patient is LT or ST at the facility and there was no answer.  CSW was unable to leave a VM.    Lind Covert, MSW, LCSW   Patient Goals and CMS Choice        Expected Discharge Plan and Services                                                Prior Living Arrangements/Services                       Activities of Daily Living      Permission Sought/Granted                  Emotional Assessment              Admission diagnosis:  Hyperkalemia [E87.5] Hyponatremia [E87.1] Septic shock ([place]) [A41.9, R65.21] Altered mental status, unspecified altered mental status type [R41.82] Sepsis with acute renal failure and septic shock, due to unspecified organism, unspecified acute renal failure type ([place]) [A41.9, R65.21, N17.9] Patient Active Problem List   Diagnosis Date Noted   Septic shock ([place]) 12/22/2021   Hyperkalemia    Acute metabolic encephalopathy 77/93/9030   Pressure injury of skin 11/15/2021   Neurogenic bladder 02/23/2021   Urinary incontinence 02/23/2021   Nephrolithiasis 02/23/2021   Constipation 06/05/2020   Hypokalemia 04/04/2016   Hyperglycemia 04/04/2016   Cellulitis 04/03/2016   Sepsis ([place]) 09/08/2014   Chronic paraplegia ([place]) 09/08/2014   Swelling of right lower extremity 07/21/2014   Discitis of lumbar region    Abscess in epidural space of lumbar spine    Blood poisoning    Osteomyelitis ([place])    Discitis of thoracolumbar region    Anxiety state    Chronic pain syndrome     Sepsis due to Candida species ([place])    Spondylarthrosis    AKI (acute kidney injury) ([place])    Other specified hypothyroidism    Essential hypertension    Paraplegia ([place])    Enteritis due to Clostridium difficile 05/08/2014   UTI (urinary tract infection) 05/08/2014   Chronic indwelling Foley catheter 05/08/2014   Hypotension    Decubitus ulcer of sacral region, unstageable ([place]) 03/28/2014   Hypothyroid 03/28/2014   Depression 03/28/2014   ARF (acute renal failure) ([place]) 03/28/2014   Acute respiratory failure with hypoxia ([place]) 03/27/2014   Pyelonephritis 10/22/2013   Leukocytosis 10/22/2013   Hyponatremia 10/22/2013   Encephalopathy acute 10/22/2013   Hypothyroidism 10/22/2013   Hypertension    Bipolar 1 disorder ([place])    Thyroid disease    Diabetes mellitus without complication ([place])    GERD (gastroesophageal reflux disease)    SHOULDER PAIN 11/19/2007   HAND, ARTHRITIS, DEGEN./OSTEO 09/03/2007   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

## 2021-12-24 NOTE — Progress Notes (Signed)
[place] Progress Note Patient Name: REFUGIA LANEVE DOB: 07-11-1945 MRN: [id number]   Date of Service  12/24/2021  HPI/Events of Note  Patient c/o back pain. AST and ALT both normal.   eICU Interventions  Plan: Tylenol 650 mg PO Q 6 hours PRN mild or moderate pain.      Intervention Category Major Interventions: Other:  Lysle Dingwall 12/24/2021, 8:41 PM

## 2021-12-24 NOTE — Progress Notes (Cosign Needed)
[facility] Progress Note     Subjective: CC:  Denies pain. About to go down for perc nephrostomy tube.  Objective: Vital signs in last 24 hours: Temp:  [96 F (35.6 C)-98.3 F (36.8 C)] 97.9 F (36.6 C) (09/29 0721) Pulse Rate:  [71-90] 82 (09/29 0700) Resp:  [7-21] 16 (09/29 0700) BP: (84-140)/(42-112) 133/73 (09/29 0700) SpO2:  [84 %-100 %] 100 % (09/29 0700) Last BM Date : 12/23/21  Intake/Output from previous day: 09/28 0701 - 09/29 0700 In: 4063.4 [P.O.:950; I.V.:2213.6; IV Piggyback:899.9] Out: 2055 [Urine:2055] Intake/Output this shift: No intake/output data recorded.  PE: Gen:  Alert, NAD, pleasant Card:  Regular rate and rhythm, pedal edema  Pulm:  Normal effort on nasal cannula  Abd: Soft, obese, non-tender, non-distended, Skin: warm and dry, no rashes  GU: WON placed [place] Pouch Psych: A&Ox1  Lab Results:  Recent Labs    12/23/21 0310 12/24/21 0244  WBC 31.2* 18.5*  HGB 7.0* 7.2*  HCT 23.0* 23.7*  PLT 442* 444*   BMET Recent Labs    12/23/21 0310 12/24/21 0244  NA 133* 134*  K 4.6 4.2  CL 106 110  CO2 12* 17*  GLUCOSE 136* 129*  BUN 64* 36*  CREATININE 1.46* 0.73  CALCIUM 8.3* 8.2*   PT/INR Recent Labs    12/24/21 0244  LABPROT 17.2*  INR 1.4*   CMP     Component Value Date/Time   NA 134 (L) 12/24/2021 0244   K 4.2 12/24/2021 0244   CL 110 12/24/2021 0244   CO2 17 (L) 12/24/2021 0244   GLUCOSE 129 (H) 12/24/2021 0244   BUN 36 (H) 12/24/2021 0244   CREATININE 0.73 12/24/2021 0244   CALCIUM 8.2 (L) 12/24/2021 0244   PROT 6.8 12/22/2021 1342   ALBUMIN 2.2 (L) 12/22/2021 1342   AST 29 12/22/2021 1342   ALT 21 12/22/2021 1342   ALKPHOS 146 (H) 12/22/2021 1342   BILITOT 1.0 12/22/2021 1342   GFRNONAA >60 12/24/2021 0244   GFRAA >60 04/09/2017 1400   Lipase  No results found for: "LIPASE"     Studies/Results: CT ABDOMEN PELVIS W CONTRAST  Result Date: 12/23/2021 CLINICAL DATA:  Sacral wound leaking  stool. EXAM: CT ABDOMEN AND PELVIS WITH CONTRAST TECHNIQUE: Multidetector CT imaging of the abdomen and pelvis was performed using the standard protocol following bolus administration of intravenous contrast. RADIATION DOSE REDUCTION: This exam was performed according to the departmental dose-optimization program which includes automated exposure control, adjustment of the mA and/or kV according to patient size and/or use of iterative reconstruction technique. CONTRAST:  60m OMNIPAQUE IOHEXOL 350 MG/ML SOLN COMPARISON:  05/21/2020 FINDINGS: Lower chest: Trace bilateral pleural effusions. Bibasilar atelectasis. Hepatobiliary: Cirrhosis without focal hepatic mass. No gallstones, gallbladder wall thickening, or biliary dilatation. Pancreas: Unremarkable. No pancreatic ductal dilatation or surrounding inflammatory changes. Spleen: Normal in size without focal abnormality. Adrenals/Urinary Tract: Adrenal glands are unremarkable. Stable bilateral renal cysts for which no further evaluation is recommended. Severe right hydronephrosis. Right nephrolithiasis with the largest in the renal pelvis measuring 3.6 cm. 4 mm right mid ureteral calculus. 7 mm right distal ureteral calculus. Decompressed bladder. Foley catheter in the bladder. Stomach/Bowel: No bowel dilatation to suggest bowel obstruction. Bowel wall thickening of the rectosigmoid colon consistent with proctocolitis. 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 16 mm low-density area in the rectal wall along the left lateral aspect likely reflecting a small intramural abscess. Vascular/Lymphatic: Normal caliber abdominal aorta with mild  atherosclerosis. No lymphadenopathy. Reproductive: Status post hysterectomy. No adnexal masses. Other: No abdominal wall hernia or abnormality. No abdominopelvic ascites. Sacral decubitus ulcer. Decubitus ulcer overlying the right ischial tuberosity. Generalized osteopenia. Mild  osteoarthritis of bilateral SI joints. Mild osteoarthritis of the right hip. Musculoskeletal: No acute osseous abnormality. No aggressive osseous lesion. Diffuse thoracolumbar spondylosis. 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 IMPRESSION: 1. Bowel wall thickening of the rectosigmoid colon consistent with proctocolitis. 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 16 mm low-density area in the rectal wall along the left lateral aspect likely reflecting a small intramural abscess. 2. Severe right hydronephrosis. Right nephrolithiasis with the largest in the renal pelvis measuring 3.6 cm. 4 mm right mid ureteral calculus. 7 mm right distal ureteral calculus. 3. Decubitus ulcer overlying the right ischial tuberosity. Electronically Signed   By: Kathreen Devoid M.D.   On: 12/23/2021 14:13   DG Chest Port 1 View  Result Date: 12/23/2021 CLINICAL DATA:  Respiratory failure EXAM: PORTABLE CHEST 1 VIEW COMPARISON:  Radiograph 12/22/2021 FINDINGS: Left neck approach catheter tip overlies the proximal SVC. Unchanged cardiomediastinal silhouette with unchanged tortuosity of the aorta. New bandlike left basilar opacity, likely atelectasis. No large effusion. No evidence of pneumothorax. Bones are unchanged. IMPRESSION: New bandlike left basilar opacity, favored to be atelectasis. Continued radiographic follow-up recommended. Electronically Signed   By: Maurine Simmering M.D.   On: 12/23/2021 08:07   DG CHEST PORT 1 VIEW  Result Date: 12/22/2021 CLINICAL DATA:  Central line EXAM: PORTABLE CHEST 1 VIEW COMPARISON:  Chest x-ray 12/22/2021 FINDINGS: There is a new left-sided central venous catheter with distal tip projecting over the brachiocephalic SVC junction. The aorta is tortuous, unchanged. Heart size is within normal limits. There is no focal lung infiltrate, pleural effusion or pneumothorax. No acute fractures are seen. IMPRESSION: New left-sided central venous  catheter with distal tip projecting over the brachiocephalic SVC junction. No pneumothorax. Electronically Signed   By: Ronney Asters M.D.   On: 12/22/2021 22:49   DG Chest Port 1 View  Result Date: 12/22/2021 CLINICAL DATA:  Altered mental status and cough. EXAM: PORTABLE CHEST 1 VIEW COMPARISON:  Chest radiograph 11/15/2021 FINDINGS: Patient appears to be slightly rotated towards the right. Heart and mediastinum are within normal limits and stable. Slightly coarse lung markings are suggestive for chronic changes. No focal airspace disease or pulmonary edema. Mild widening at the bilateral AC joints. Negative for a pneumothorax. IMPRESSION: No acute chest findings. Electronically Signed   By: Markus Daft M.D.   On: 12/22/2021 13:29    Anti-infectives: Anti-infectives (From admission, onward)    Start     Dose/Rate Route Frequency Ordered Stop   12/24/21 0000  levofloxacin (LEVAQUIN) IVPB 500 mg       Note to Pharmacy: To be given for radiology procedure, will be given in IR by IR nurse   500 mg 100 mL/hr over 60 Minutes Intravenous On call 12/23/21 1506 12/25/21 0000   12/23/21 1000  linezolid (ZYVOX) IVPB 600 mg        600 mg 300 mL/hr over 60 Minutes Intravenous Every 12 hours 12/23/21 0836     12/23/21 0400  meropenem (MERREM) 1 g in sodium chloride 0.9 % 100 mL IVPB        1 g 200 mL/hr over 30 Minutes Intravenous Every 12 hours 12/22/21 1645     12/22/21 1800  piperacillin-tazobactam (ZOSYN) IVPB 3.375 g  Status:  Discontinued  3.375 g 12.5 mL/hr over 240 Minutes Intravenous Every 6 hours 12/22/21 1508 12/22/21 1515   12/22/21 1600  meropenem (MERREM) 1 g in sodium chloride 0.9 % 100 mL IVPB        1 g 200 mL/hr over 30 Minutes Intravenous  Once 12/22/21 1553 12/22/21 1646   12/22/21 1530  piperacillin-tazobactam (ZOSYN) IVPB 3.375 g  Status:  Discontinued        3.375 g 12.5 mL/hr over 240 Minutes Intravenous Every 8 hours 12/22/21 1515 12/22/21 1544       Assessment/Plan Anal necrosis with colocutaneous fistula -Appreciate WOC assisting with pouching this area, it is constantly draining semi-solid non-bloody stool.  - continue IV abx for proctitis. I do not this she needs urgent/emergent debridement of this wound. - may end up needing a diverting end colostomy. She was heading down for IR procedure which was more pressing given her obstructive right ureteral stones. I will plan to talk to the patient and her family (daughter) about this procedure later today or on Monday     Per primary team-- Obstructing R ureteral stone - per Dr. Abner Greenspan, IR perc nephrostomy today Septic shock -- urosepsis, on low dose Levophed; blood cultures (NGTD) and urine cultures (multiple species) are pending Acute kidney injury -- improving Acute on chronic respiratory failure, COPD DM2 Hypothyroidism GERD History of paraplegia -per daughter for over 15 years, after a vascular event involving her spinal cord.     FEN -currently n.p.o. due to mental status VTE - SCDs, okay for DVT prophylaxis from a surgical perspective ID - meropenem Admit -ICU, CCM service   LOS: 2 days   I reviewed nursing notes, hospitalist notes, last 24 h vitals and pain scores, last 48 h intake and output, last 24 h labs and trends, and last 24 h imaging results.    Obie Dredge, PA-C [facility] Please see Amion for pager number during day hours 7:00am-4:30pm

## 2021-12-24 NOTE — Progress Notes (Signed)
NAME:  [name], MRN:  [id number], DOB:  [birth year], LOS: 2 ADMISSION DATE:  12/22/2021, CONSULTATION DATE:  9/27   REFERRING MD:  Mayra Neer, CHIEF COMPLAINT:  AMS   History of Present Illness:  [name], is a 76 y.o. female, who [facility] with a chief complaint of AMS  They have a pertinent past medical history of paraplegic spinal paralysis, COPD, DM 2, GERD, hypertension, obesity, hypothyroidism, unstageable decubitus ulcer.  Per notes prior to arrival, patient with recent decline in eating habits, sleeping more, possible UTI. Recent admission to [facility] on 8/20-8/25 with urosepsis secondary to gram negative UTI.   ED course was notable for WBC of 35.7, blood pressure of 76/45. Patient with AKI and K 5.9, A code sepsis was called. 3L IVF was given. BC and UC were obtained. Meropenem was started.   PCCM was consulted for admission.  Upon arrival to [facility] pt was on 7 Levophed; however, IV noted to not be functioning appropriately. SBP in 120's so Levophed turned off while awaiting new IV.  Pertinent  Medical History  paraplegic spinal paralysis, COPD, DM2, , GERD, hypertension, obesity, hypothyroidism, unstageable decubitus ulcer.  Significant Hospital Events: Including procedures, antibiotic start and stop dates in addition to other pertinent events   9/27 Presented to [facility], meropenem, PCCM consult CT abdomen/pelvis 9/28 >> cirrhotic changes without mass, normal gallbladder, severe right hydronephrosis with chronic right nephrolithiasis proximally and distally.  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  There is a 58m low-density area in the rectal wall along the lateral aspect that could represent a small intramural abscess.  Decubitus ulcer overlying the right ischial tuberosity extends almost to bone C. difficile toxin  9/28 >> negative Urine culture 9/27 >> multiple species, needs to be recollected Blood cultures 9/27 >>   Interim History / Subjective:  No acute events overnight CT abdomen pelvis 9/28 as above Norepinephrine to 7 10 L/min nasal cannula I/O+ 2.4 L total Persistent mild metabolic acidosis Leukocytosis improved  Objective   Blood pressure 133/73, pulse 82, temperature 97.9 F (36.6 C), temperature source Oral, resp. rate 16, height '5\' 7"'$  (1.702 m), weight 102.1 kg, SpO2 100 %. 4L         Intake/Output Summary (Last 24 hours) at 12/24/2021 0829 Last data filed at 12/24/2021 0600 Gross per 24 hour  Intake 3970.48 ml  Output 2055 ml  Net 1915.48 ml   Filed Weights   12/22/21 1918 12/23/21 0441  Weight: 97.3 kg 102.1 kg    Examination: General: Chronically ill-appearing woman laying in bed HEENT: Oropharynx moist, clear Neuro: Awake, interacts, poorly oriented, able to move upper extremities CV: Regular, borderline tachycardic, no murmur PULM: Decreased to both bases, no wheeze or crackles GI: Obese, nondistended with positive bowel sounds Extremities: No pretibial edema Skin: See photos below  Rectum and DTI on left hip in rect    RT lateral thigh   L lateral thigh   Rt ischium       Assessment & Plan:   Septic Shock -primary source likely complicated UTI from obstructing stone, pyelonephritis.  Suspect some possible contribution from her cutaneous fistula, proctitis and associated mural abscess. -Continue volume resuscitation, repeat IV fluid bolus 9/29 -Wean norepinephrine as able -Definitive source control with percutaneous nephrostomy tube planned in IR this morning. Appreciate CCS evaluation and assistance.  Ultimately she will  need more definitive treatment to deal with her intracutaneous fistula and associated chronic infection/inflammation. -Continue meropenem and linezolid for now.  Plan to narrow based on culture data -Repeat urine sample will  need to be sent, multiple morphologies on her initial urine culture -Appreciate urology evaluation.  Definitive treatment for her renal calculi can likely wait until recovered from this acute process, as an outpatient  Acute respiratory failure with hypoxia. COPD. FiO2 has been uptitrated to 10 L/min.  No clear evidence for acute exacerbation COPD at this time -Push pulmonary hygiene -Careful with aggressive IV fluids -Wean FiO2 as able -Add DuoNeb as needed  Acute metabolic encephalopathy, improving slowly GCS 14, slightly confused, unclear baseline, confused to place.  -Monitor mental status -supportive care as above  AKI Suspect secondary to hypotension/septic ATN. Improved Hyponatremia - presumed hypovolemic. Improving Hyperkalemia- Resolved -Supportive care for underlying contributors.  Ensure adequate renal perfusion -Renal dose medications and avoid nephrotoxins -Follow urine output and BMP with volume resuscitation  Normocytic anemia, suspect chronic -Following CBC -Transfusion threshold hemoglobin 7.0 -Following for any signs of active bleeding  Hx hypertension Currently on vasopressors -Home antihypertensive regimen currently on hold  Hx DM2 BG 80-136 -Blood Glucose goal < 180. -Currently at goal, start SSI if indicated  Hx Hypothyroidism. TSh 3.77 -Continue levothyroxine  Hx GERD -Continue pantoprazole  HX paraplegic spinal paralysis. -Push PT/OT  Unstageable decubitus ulcer (POA). -Appreciate WOC and CCS evaluation -Suspect ultimately will require diverting colostomy.  Unclear whether the proctitis, mural abscess will require any definitive therapy   Best Practice (right click and "Reselect all SmartList Selections" daily)   Diet/type: Regular consistency (see orders) DVT prophylaxis: prophylactic heparin  GI prophylaxis: PPI Lines: Central line Foley:  Yes, and it is still needed Code Status:  full code Last date of multidisciplinary goals of  care discussion: None yet.    Critical care time: 35 min.   The patient is critically ill with multiple organ systems failure and requires high complexity decision making for assessment and support, frequent evaluation and titration of therapies, application of advanced monitoring technologies and extensive interpretation of multiple databases.    Baltazar Apo, MD, PhD 12/24/2021, 8:46 AM [facility] [phone] or if no answer before 7:00PM call [phone] For any issues after 7:00PM please call eLink [phone]

## 2021-12-24 NOTE — Procedures (Signed)
Interventional Radiology Procedure Note  Procedure: Image guided right PCN placement, frank pyonephrosis ~110cc's.  35F pigtail drain.  Complications: None  EBL: None Sample: Culture sent  Recommendations: - Routine drain care,  record output - follow up Cx - routine wound care  Signed,  Dulcy Fanny. Earleen Newport, DO

## 2021-12-25 ENCOUNTER — Inpatient Hospital Stay: Payer: Self-pay

## 2021-12-25 DIAGNOSIS — R6521 Severe sepsis with septic shock: Secondary | ICD-10-CM | POA: Diagnosis not present

## 2021-12-25 DIAGNOSIS — A419 Sepsis, unspecified organism: Secondary | ICD-10-CM | POA: Diagnosis not present

## 2021-12-25 LAB — CBC
HCT: 21.2 % — ABNORMAL LOW (ref 36.0–46.0)
HCT: 20.4 % — ABNORMAL LOW (ref 36.0–46.0)
HCT: 26.5 % — ABNORMAL LOW (ref 36.0–46.0)
Hemoglobin: 6.5 g/dL — CL (ref 12.0–15.0)
Hemoglobin: 6.6 g/dL — CL (ref 12.0–15.0)
Hemoglobin: 8.4 g/dL — ABNORMAL LOW (ref 12.0–15.0)
MCH: 26.3 pg (ref 26.0–34.0)
MCH: 27.6 pg (ref 26.0–34.0)
MCH: 27.3 pg (ref 26.0–34.0)
MCHC: 30.7 g/dL (ref 30.0–36.0)
MCHC: 32.4 g/dL (ref 30.0–36.0)
MCHC: 31.7 g/dL (ref 30.0–36.0)
MCV: 85.8 fL (ref 80.0–100.0)
MCV: 85.4 fL (ref 80.0–100.0)
MCV: 86 fL (ref 80.0–100.0)
Platelets: 291 10*3/uL (ref 150–400)
Platelets: 268 10*3/uL (ref 150–400)
Platelets: 299 10*3/uL (ref 150–400)
RBC: 2.47 MIL/uL — ABNORMAL LOW (ref 3.87–5.11)
RBC: 2.39 MIL/uL — ABNORMAL LOW (ref 3.87–5.11)
RBC: 3.08 MIL/uL — ABNORMAL LOW (ref 3.87–5.11)
RDW: 16.8 % — ABNORMAL HIGH (ref 11.5–15.5)
RDW: 16.9 % — ABNORMAL HIGH (ref 11.5–15.5)
RDW: 16.8 % — ABNORMAL HIGH (ref 11.5–15.5)
WBC: 9.5 10*3/uL (ref 4.0–10.5)
WBC: 9.1 10*3/uL (ref 4.0–10.5)
WBC: 10.2 10*3/uL (ref 4.0–10.5)
nRBC: 0 % (ref 0.0–0.2)
nRBC: 0 % (ref 0.0–0.2)
nRBC: 0 % (ref 0.0–0.2)

## 2021-12-25 LAB — GLUCOSE, CAPILLARY
Glucose-Capillary: 76 mg/dL (ref 70–99)
Glucose-Capillary: 75 mg/dL (ref 70–99)
Glucose-Capillary: 99 mg/dL (ref 70–99)
Glucose-Capillary: 95 mg/dL (ref 70–99)

## 2021-12-25 LAB — BASIC METABOLIC PANEL
Anion gap: 5 (ref 5–15)
BUN: 20 mg/dL (ref 8–23)
CO2: 19 mmol/L — ABNORMAL LOW (ref 22–32)
Calcium: 7.7 mg/dL — ABNORMAL LOW (ref 8.9–10.3)
Chloride: 108 mmol/L (ref 98–111)
Creatinine, Ser: 0.54 mg/dL (ref 0.44–1.00)
GFR, Estimated: >60 mL/min (ref >60)
Glucose, Bld: 84 mg/dL (ref 70–99)
Potassium: 3.8 mmol/L (ref 3.5–5.1)
Sodium: 132 mmol/L — ABNORMAL LOW (ref 135–145)

## 2021-12-25 LAB — PHOSPHORUS: Phosphorus: 2 mg/dL — ABNORMAL LOW (ref 2.5–4.6)

## 2021-12-25 LAB — PREPARE RBC (CROSSMATCH)

## 2021-12-25 LAB — MAGNESIUM: Magnesium: 1.7 mg/dL (ref 1.7–2.4)

## 2021-12-25 MED ORDER — BUPROPION HCL ER (XL) 150 MG PO TB24
300.0000 mg | ORAL_TABLET | Freq: Every day | ORAL | Status: DC
  Administered 2021-12-26 – 2021-12-29 (×4): 300 mg via ORAL
  Filled 2021-12-25: qty 1
  Filled 2021-12-25: qty 2
  Filled 2021-12-25 (×2): qty 1
  Filled 2021-12-25: qty 2

## 2021-12-25 MED ORDER — SODIUM CHLORIDE 0.9% FLUSH
10.0000 mL | Freq: Two times a day (BID) | INTRAVENOUS | Status: DC
  Administered 2021-12-25 (×2): 10 mL

## 2021-12-25 MED ORDER — SODIUM CHLORIDE 0.9% IV SOLUTION: Freq: Once | INTRAVENOUS | Status: AC

## 2021-12-25 MED ORDER — GABAPENTIN 300 MG PO CAPS
300.0000 mg | ORAL_CAPSULE | Freq: Three times a day (TID) | ORAL | Status: DC
  Administered 2021-12-25 – 2021-12-29 (×13): 300 mg via ORAL
  Filled 2021-12-25 (×13): qty 1

## 2021-12-25 MED ORDER — FAMOTIDINE 20 MG PO TABS
20.0000 mg | ORAL_TABLET | Freq: Two times a day (BID) | ORAL | Status: DC
  Administered 2021-12-25 – 2021-12-29 (×10): 20 mg via ORAL
  Filled 2021-12-25 (×10): qty 1

## 2021-12-25 MED ORDER — MAGNESIUM SULFATE 2 GM/50ML IV SOLN
2.0000 g | Freq: Once | INTRAVENOUS | Status: AC
  Administered 2021-12-25: 2 g via INTRAVENOUS
  Filled 2021-12-25: qty 50

## 2021-12-25 MED ORDER — EZETIMIBE 10 MG PO TABS
10.0000 mg | ORAL_TABLET | Freq: Every day | ORAL | Status: DC
  Administered 2021-12-25 – 2021-12-29 (×5): 10 mg via ORAL
  Filled 2021-12-25 (×5): qty 1

## 2021-12-25 MED ORDER — SODIUM CHLORIDE 0.9% FLUSH: 10.0000 mL | INTRAVENOUS | Status: DC | PRN

## 2021-12-25 NOTE — Progress Notes (Signed)
Pt noted to have a urine soiled bed when checked at 2200. During peri care, pt's foley became dislodged. Tip of catheter and balloon intact. No trauma noted. Malo notified, new coude cath kit ordered. 4W IP Technical brewer called for assistance. Awaiting kit and new orders to replace foley.

## 2021-12-25 NOTE — Progress Notes (Signed)
[facility] [facility] Progress Note Patient Name: [name] DOB: 12/27/1945 MRN: [id number]   Date of Service  12/25/2021  HPI/Events of Note  Anemia - Hgb = 6.5.   eICU Interventions  Will transfuse 1 unit PRBC.     Intervention Category Major Interventions: Other:  Nyeisha Goodall Cornelia Copa 12/25/2021, 7:06 AM

## 2021-12-25 NOTE — Progress Notes (Signed)
NAME:  [name], MRN:  [id number], DOB:  14-Apr-1945, LOS: 3 ADMISSION DATE:  12/22/2021, CONSULTATION DATE:  9/27   REFERRING MD:  Mayra Neer, CHIEF COMPLAINT:  AMS   History of Present Illness:  [name], is a 76 y.o. female, who presented to the [facility] from [facility] with a chief complaint of AMS  They have a pertinent past medical history of paraplegic spinal paralysis, COPD, DM 2, GERD, hypertension, obesity, hypothyroidism, unstageable decubitus ulcer.  Per notes prior to arrival, patient with recent decline in eating habits, sleeping more, possible UTI. Recent admission to [facility] on 8/20-8/25 with urosepsis secondary to gram negative UTI.   ED course was notable for WBC of 35.7, blood pressure of 76/45. Patient with AKI and K 5.9, A code sepsis was called. 3L IVF was given. BC and UC were obtained. Meropenem was started.   PCCM was consulted for admission.  Upon arrival to [facility], pt was on 7 Levophed; however, IV noted to not be functioning appropriately. SBP in 120's so Levophed turned off while awaiting new IV.  Pertinent  Medical History  paraplegic spinal paralysis, COPD, DM2, , GERD, hypertension, obesity, hypothyroidism, unstageable decubitus ulcer.  Significant Hospital Events: Including procedures, antibiotic start and stop dates in addition to other pertinent events   9/27 Presented to [facility], meropenem, PCCM consult CT abdomen/pelvis 9/28 >> cirrhotic changes without mass, normal gallbladder, severe right hydronephrosis with chronic right nephrolithiasis proximally and distally.  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  There is a 67m low-density area in the rectal wall along the lateral aspect that could represent a small intramural abscess.  Decubitus ulcer overlying the right ischial tuberosity extends almost to bone C. difficile toxin  9/28 >> negative Urine culture 9/27 >> multiple species, needs to be recollected Blood cultures 9/27 >>   Interim History / Subjective:  Remains off levophed. On 3L [place] this AM. Hemoglobin 6.6 receiving 1 unit RBC.   Objective   Blood pressure 110/65, pulse 90, temperature 99.1 F (37.3 C), temperature source Axillary, resp. rate 18, height '5\' 7"'$  (1.702 m), weight 119.8 kg, SpO2 100 %. 4L [place]        Intake/Output Summary (Last 24 hours) at 12/25/2021 0900 Last data filed at 12/25/2021 0800 Gross per 24 hour  Intake 3197.31 ml  Output 1175 ml  Net 2022.31 ml   Filed Weights   12/22/21 1918 12/23/21 0441 12/25/21 0500  Weight: 97.3 kg 102.1 kg 119.8 kg    Examination: General: Chronically ill-appearing woman laying in bed HEENT: MMM Neuro: Alert, follows commands, oriented  CV: Regular, no mRG, HR 90  PULM: Diminished to bases, no use of accessory muscles  GI: Obese, nondistended with positive bowel sounds Extremities: +2 pedal edema  Skin: See photos below  Rectum and DTI on left hip in rect    RT lateral thigh   L lateral thigh   Rt ischium     Resolved:  Acute Metabolic Encephalopathy   Assessment & Plan:   Septic Shock -primary source likely complicated UTI from obstructing stone, pyelonephritis.  Suspect some possible contribution from her cutaneous fistula, proctitis and associated mural abscess. H/O HTN Plan  -Remains off vasopressors. MAP goal >65  -Appreciate CCS evaluation and assistance.  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 -Continue to hold home Coreg  UTI with obstructing stone and pyelonephritis s/p nephrostomy tube placement 9/29 Plan -Urology and IR following  -Continue meropenem and linezolid for now. -Follow Culture Data   Acute respiratory failure with hypoxia. > improving COPD. Not in exacerbation  Plan -Titrate supplemental oxygen  for saturation goal >88 -Encourage good pulmonary hygiene  -DuoNeb as needed  AKI Suspect secondary to hypotension/septic ATN. Improved Hyponatremia - presumed hypovolemic. Improving Hyperkalemia- Resolved Plan -Supportive care for underlying contributors.  Ensure adequate renal perfusion -Renal dose medications and avoid nephrotoxins -Follow urine output and BMP with volume resuscitation  Normocytic anemia, suspect chronic with component of acute given sepsis  Plan -Following CBC > receiving one unit RBC now. No active bleeding noted  -Transfusion threshold hemoglobin 7.0 -Following for any signs of active bleeding  Hx DM2 BG 80-136 -Blood Glucose goal < 180. -Currently at goal, start SSI if indicated  Hx Hypothyroidism. TSh 3.77 -Continue levothyroxine  Hx GERD -Change to home pepcid. Patient states Protonix does not work for her   HX paraplegic spinal paralysis. -PT/OT  Unstageable decubitus ulcer (POA). -Appreciate WOC and CCS evaluation -Suspect ultimately will require diverting colostomy.  Unclear whether the proctitis, mural abscess will require any definitive therapy   Best Practice (right click and "Reselect all SmartList Selections" daily)   Diet/type: Regular consistency (see orders) DVT prophylaxis: prophylactic heparin  GI prophylaxis: PPI Lines: Central line Foley:  Yes, and it is still needed Code Status:  full code Last date of multidisciplinary goals of care discussion: None yet.  Dispo: Transfer to telemetry unit  Time Spent: 55 minutes    Hayden Pedro, AGACNP-BC [place] Pgr: [phone]

## 2021-12-25 NOTE — Progress Notes (Signed)
[facility] ADULT ICU REPLACEMENT PROTOCOL   The patient does apply for the [facility] Adult ICU Electrolyte Replacment Protocol based on the criteria listed below:   1.Exclusion criteria: TCTS patients, ECMO patients, and Dialysis patients 2. Is GFR >/= 30 ml/min? Yes.    Patient's GFR today is >60 3. Is SCr </= 2? Yes.   Patient's SCr is 0.54 mg/dL 4. Did SCr increase >/= 0.5 in 24 hours? No. 5.Pt's weight >40kg  Yes.   6. Abnormal electrolyte(s): Mag 1.7  7. Electrolytes replaced per protocol 8.  Call MD STAT for K+ </= 2.5, Phos </= 1, or Mag </= 1 Physician:  Randie Heinz 12/25/2021 6:43 AM

## 2021-12-25 NOTE — Progress Notes (Signed)
Peripherally Inserted Central Catheter Placement  The IV Nurse has discussed with the patient and/or persons authorized to consent for the patient, the purpose of this procedure and the potential benefits and risks involved with this procedure.  The benefits include less needle sticks, lab draws from the catheter, and the patient may be discharged home with the catheter. Risks include, but not limited to, infection, bleeding, blood clot (thrombus formation), and puncture of an artery; nerve damage and irregular heartbeat and possibility to perform a PICC exchange if needed/ordered by physician.  Alternatives to this procedure were also discussed.  Bard Power PICC patient education guide, fact sheet on infection prevention and patient information card has been provided to patient /or left at bedside.    PICC Placement Documentation  PICC Double Lumen 25/42/70 Left Basilic 44 cm 0 cm (Active)  Indication for Insertion or Continuance of Line Poor Vasculature-patient has had multiple peripheral attempts or PIVs lasting less than 24 hours 12/25/21 1405  Exposed Catheter (cm) 0 cm 12/25/21 1405  Site Assessment Clean, Dry, Intact 12/25/21 1405  Lumen #1 Status Flushed;Saline locked;Blood return noted 12/25/21 1405  Lumen #2 Status Flushed;Saline locked;Blood return noted 12/25/21 1405  Dressing Type Transparent;Securing device 12/25/21 1405  Dressing Status Antimicrobial disc in place;Clean, Dry, Intact 12/25/21 1405  Safety Lock Not Applicable 62/37/62 8315  Line Care Connections checked and tightened 12/25/21 1405  Line Adjustment (NICU/IV Team Only) No 12/25/21 1405  Dressing Intervention New dressing 12/25/21 1405  Dressing Change Due 01/01/22 12/25/21 1405       [name] 12/25/2021, 2:05 PM

## 2021-12-25 NOTE — Progress Notes (Addendum)
Referring Physician(s): Hillery Aldo, NP  Supervising Physician: Corrie Mckusick  Patient Status:  [facility] - In-pt  Chief Complaint:  S/p right PCN placement 12/18/21  Subjective:  Patient sitting upright in bed watching TV at time of exam. She is pleasantly confused and does not remember having a right PCN placed yesterday. She denies any pain of her right flank.  Allergies: Invanz [ertapenem], Propranolol hcl, Rocephin [ceftriaxone], and Vancomycin  Medications: Prior to Admission medications   Medication Sig Start Date End Date Taking? Authorizing Provider  acetaminophen (TYLENOL) 325 MG tablet Take 325 mg by mouth 2 (two) times daily. *May take one every 6 hours as needed for pain   Yes [provider]  allopurinol (ZYLOPRIM) 100 MG tablet Take 100 mg by mouth daily.  10/14/14  Yes [provider]  Amino Acids-Protein Hydrolys (FEEDING SUPPLEMENT, PRO-STAT 64,) LIQD Take 30 mLs by mouth 3 (three) times daily with meals.   Yes [provider]  aspirin EC 81 MG tablet Take 81 mg by mouth daily.   Yes [provider]  atorvastatin (LIPITOR) 10 MG tablet Take 10 mg by mouth daily.   Yes [provider]  azelastine (ASTELIN) 0.1 % nasal spray Place 2 sprays into both nostrils 2 (two) times daily. Use in each nostril as directed   Yes [provider]  b complex vitamins tablet Take 1 tablet by mouth daily.   Yes [provider]  buPROPion (WELLBUTRIN XL) 300 MG 24 hr tablet Take 300 mg by mouth daily.    Yes [provider]  Calcium Carb-Cholecalciferol (CALCIUM CARBONATE-VITAMIN D3 PO) Take 1 tablet by mouth in the morning and at bedtime.   Yes [provider]  carvedilol (COREG) 12.5 MG tablet Take 12.5 mg by mouth 2 (two) times daily with a meal.   Yes [provider]  Cranberry 500 MG TABS Take 1 capsule by mouth daily.   Yes [provider]  dextromethorphan-guaiFENesin (MUCINEX DM)  30-600 MG per 12 hr tablet Take 1 tablet by mouth every 12 (twelve) hours as needed for cough.    Yes [provider]  docusate sodium (COLACE) 100 MG capsule Take 100 mg by mouth at bedtime.   Yes [provider]  ezetimibe (ZETIA) 10 MG tablet Take 10 mg by mouth at bedtime.    Yes [provider]  famotidine (PEPCID) 20 MG tablet Take 20 mg by mouth 2 (two) times daily.   Yes [provider]  ferrous sulfate 325 (65 FE) MG EC tablet Take 325 mg by mouth 3 (three) times daily with meals.   Yes [provider]  fluticasone (FLONASE) 50 MCG/ACT nasal spray Place 2 sprays into both nostrils daily.  09/03/14  Yes [provider]  furosemide (LASIX) 20 MG tablet Take 1 tablet (20 mg total) by mouth daily. 11/19/21  Yes Emokpae, Courage, MD  gabapentin (NEURONTIN) 300 MG capsule Take 300 mg by mouth every 8 (eight) hours.   Yes [provider]  ipratropium-albuterol (DUONEB) 0.5-2.5 (3) MG/3ML SOLN Take 3 mLs by nebulization every 6 (six) hours as needed (shortness of breath). 11/19/21  Yes Roxan Hockey, MD  Javier Docker Oil 300 MG CAPS Take 1 capsule by mouth 2 (two) times daily.   Yes [provider]  Lactobacillus (ACIDOPHILUS PO) Take 1 tablet by mouth in the morning and at bedtime.   Yes [provider]  levothyroxine (SYNTHROID) 150 MCG tablet Take 175 mcg by mouth daily.  Yes [provider]  lisinopril (ZESTRIL) 2.5 MG tablet Take 2.5 mg by mouth daily.   Yes [provider]  magnesium oxide (MAG-OX) 400 MG tablet Take 400 mg by mouth daily.   Yes [provider]  Menthol-Zinc Oxide (CALMOSEPTINE) 0.44-20.6 % OINT Apply 1 Application topically every 8 (eight) hours as needed (protection/prevention).   Yes [provider]  mirabegron ER (MYRBETRIQ) 25 MG TB24 tablet Take 25 mg by mouth daily.   Yes [provider]  Multiple Vitamins-Minerals (CERTAGEN PO) Take 1 tablet by mouth  daily.   Yes [provider]  Nutritional Supplements (ARGINAID) PACK Take 1 packet by mouth daily.   Yes [provider]  oxycodone (OXY-IR) 5 MG capsule Take 1 capsule (5 mg total) by mouth in the morning and at bedtime. 11/19/21  Yes Emokpae, Courage, MD  pantoprazole (PROTONIX) 40 MG tablet Take 1 tablet (40 mg total) by mouth daily. 11/20/21  Yes Emokpae, Courage, MD  potassium chloride 20 MEQ TBCR Take 10 mEq by mouth daily. 04/06/16  Yes Kathie Dike, MD  tamsulosin (FLOMAX) 0.4 MG CAPS capsule Take 1 capsule (0.4 mg total) by mouth daily after supper. 11/19/21  Yes Emokpae, Courage, MD  topiramate (TOPAMAX) 50 MG tablet Take 50 mg by mouth 2 (two) times daily.   Yes [provider]  vitamin C (ASCORBIC ACID) 500 MG tablet Take 500 mg by mouth 2 (two) times daily.   Yes [provider]  Vitamin D, Ergocalciferol, (DRISDOL) 1.25 MG (50000 UNIT) CAPS capsule Take 50,000 Units by mouth every 7 (seven) days.   Yes [provider]  leptospermum manuka honey (MEDIHONEY) PSTE paste Apply 1 Application topically daily. Apply Medihoney to right and left inner  and outer thigh wounds and left lower buttock Q day, then cover with foam dressing.  Change foam dressings Q 3 days or PRN soiling. 2. Apply moist gauze packing to right middle buttock Q day, using swab to fill, then cover with foam dressing.  (Change foam dressing Q 3 days or PRN soiling.) 2. Foam dressings to right inner groin, left buttock, right lower buttock, right upper thigh.  Change Q 3 days or PRN soiling. Apply thin layer (3 mm) to wound. Patient not taking: Reported on 12/22/2021 11/20/21   Roxan Hockey, MD  SANTYL 250 UNIT/GM ointment Apply 1 Application topically daily. 12/22/21   [provider]  Wound Dressings (PROMOGRAN EX) Apply topically daily.    [provider]     Vital Signs: BP (!) 104/53   Pulse 89   Temp 99.1 F (37.3 C) (Axillary)   Resp (!) 22   Ht 5'  7" (1.702 m)   Wt 264 lb 1.8 oz (119.8 kg)   SpO2 100%   BMI 41.37 kg/m   Physical Exam Vitals reviewed.  Constitutional:      Appearance: She is obese.  Pulmonary:     Effort: Pulmonary effort is normal.  Abdominal:     Palpations: Abdomen is soft.  Skin:    General: Skin is warm and dry.    Drain Location: Right flank Size: Fr size: 10 Fr Date of placement: 12/24/21  Currently to: Drain collection device: gravity 24 hour output:  Output by Drain (mL) 12/23/21 0701 - 12/23/21 1900 12/23/21 1901 - 12/24/21 0700 12/24/21 0701 - 12/24/21 1900 12/24/21 1901 - 12/25/21 0700 12/25/21 0701 - 12/25/21 1101  Requested LDAs do not have output data documented.    Interval imaging/drain manipulation:  None  Current examination: ~100cc of light yellow fluid with a pink tinge in gravity bag at time of exam. Flushes/aspirates easily.  Insertion site unremarkable. Suture in place. Dressed appropriately. Small amount of blood noticed on dressing, asked RN to change dressing so that we can evaluate if patient has continued bleeding given the noted hemoglobin of 6.5 this AM.     Imaging: IR NEPHROSTOMY PLACEMENT RIGHT  Result Date: 12/24/2021 INDICATION: 76 year old female referred for percutaneous nephrostomy. Current sepsis and a history of paraplegia EXAM: IMAGE GUIDED PERCUTANEOUS NEPHROSTOMY COMPARISON:  None MEDICATIONS: 500 mg IV Levaquin ANESTHESIA/SEDATION: Moderate (conscious) sedation was employed during this procedure. A total of Versed 0.5 mg and Fentanyl 25 mcg was administered intravenously by the radiology nurse. Total intra-service moderate Sedation Time: 13 minutes. The patient's level of consciousness and vital signs were monitored continuously by radiology nursing throughout the procedure under my direct supervision. CONTRAST:  15 cc-administered into the collecting system(s) FLUOROSCOPY: Radiation Exposure Index (as provided by the fluoroscopic device): 2 mGy Kerma  COMPLICATIONS: None PROCEDURE: Informed written consent was obtained from the patient and the patient's family after a thorough discussion of the procedural risks, benefits and alternatives. All questions were addressed. Maximal Sterile Barrier Technique was utilized including caps, mask, sterile gowns, sterile gloves, sterile drape, hand hygiene and skin antiseptic. A timeout was performed prior to the initiation of the procedure. Patient positioned prone position on the fluoroscopy table. Ultrasound survey of the right flank was performed with images stored and sent to PACs. The patient was then prepped and draped in the usual sterile fashion. 1% lidocaine was used to anesthetize the skin and subcutaneous tissues for local anesthesia. A Chiba needle was then used to access a posterior inferior calyx with ultrasound guidance. With spontaneous urine returned through the needle, passage of an 018 micro wire into the collecting system was performed under fluoroscopy. A small incision was made with an 11 blade scalpel, and the needle was removed from the wire. An Accustick system was then advanced over the wire into the collecting system under fluoroscopy. The metal stiffener and inner dilator were removed. Contrast was injected confirming location in the collecting system. Bentson wire was passed into the collecting system and the sheath removed. Ten French dilation of the soft tissues was performed. Using modified Seldinger technique, a 10 French pigtail catheter drain was placed over the Bentson wire. Wire and inner stiffener removed, and the pigtail was formed in the collecting system. Approximately 110 cc of frank pyonephrosis was aspirated. Sample was sent to the lab. Small amount of contrast confirmed position of the catheter. Patient tolerated the procedure well and remained hemodynamically stable throughout. No complications were encountered and no significant blood loss encountered IMPRESSION: Status post  image guided right-sided percutaneous nephrostomy, for decompression of frank pyonephrosis. Signed, Dulcy Fanny. Nadene Rubins, RPVI [facility] Electronically Signed   By: Corrie Mckusick D.O.   On: 12/24/2021 09:31   CT ABDOMEN PELVIS W CONTRAST  Result Date: 12/23/2021 CLINICAL DATA:  Sacral wound leaking stool. EXAM: CT ABDOMEN AND PELVIS WITH CONTRAST TECHNIQUE: Multidetector CT imaging of the abdomen and pelvis was performed using the standard protocol following bolus administration of intravenous contrast. RADIATION DOSE REDUCTION: This exam was performed according to the departmental dose-optimization program which includes automated exposure control, adjustment of the mA and/or kV according to patient size and/or use of iterative reconstruction technique. CONTRAST:  72m OMNIPAQUE IOHEXOL 350 MG/ML SOLN COMPARISON:  05/21/2020 FINDINGS: Lower  chest: Trace bilateral pleural effusions. Bibasilar atelectasis. Hepatobiliary: Cirrhosis without focal hepatic mass. No gallstones, gallbladder wall thickening, or biliary dilatation. Pancreas: Unremarkable. No pancreatic ductal dilatation or surrounding inflammatory changes. Spleen: Normal in size without focal abnormality. Adrenals/Urinary Tract: Adrenal glands are unremarkable. Stable bilateral renal cysts for which no further evaluation is recommended. Severe right hydronephrosis. Right nephrolithiasis with the largest in the renal pelvis measuring 3.6 cm. 4 mm right mid ureteral calculus. 7 mm right distal ureteral calculus. Decompressed bladder. Foley catheter in the bladder. Stomach/Bowel: No bowel dilatation to suggest bowel obstruction. Bowel wall thickening of the rectosigmoid colon consistent with proctocolitis. 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 16 mm low-density area in the rectal wall along the left lateral aspect likely reflecting a  small intramural abscess. Vascular/Lymphatic: Normal caliber abdominal aorta with mild atherosclerosis. No lymphadenopathy. Reproductive: Status post hysterectomy. No adnexal masses. Other: No abdominal wall hernia or abnormality. No abdominopelvic ascites. Sacral decubitus ulcer. Decubitus ulcer overlying the right ischial tuberosity. Generalized osteopenia. Mild osteoarthritis of bilateral SI joints. Mild osteoarthritis of the right hip. Musculoskeletal: No acute osseous abnormality. No aggressive osseous lesion. Diffuse thoracolumbar spondylosis. 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 IMPRESSION: 1. Bowel wall thickening of the rectosigmoid colon consistent with proctocolitis. 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 16 mm low-density area in the rectal wall along the left lateral aspect likely reflecting a small intramural abscess. 2. Severe right hydronephrosis. Right nephrolithiasis with the largest in the renal pelvis measuring 3.6 cm. 4 mm right mid ureteral calculus. 7 mm right distal ureteral calculus. 3. Decubitus ulcer overlying the right ischial tuberosity. Electronically Signed   By: Kathreen Devoid M.D.   On: 12/23/2021 14:13   DG Chest Port 1 View  Result Date: 12/23/2021 CLINICAL DATA:  Respiratory failure EXAM: PORTABLE CHEST 1 VIEW COMPARISON:  Radiograph 12/22/2021 FINDINGS: Left neck approach catheter tip overlies the proximal SVC. Unchanged cardiomediastinal silhouette with unchanged tortuosity of the aorta. New bandlike left basilar opacity, likely atelectasis. No large effusion. No evidence of pneumothorax. Bones are unchanged. IMPRESSION: New bandlike left basilar opacity, favored to be atelectasis. Continued radiographic follow-up recommended. Electronically Signed   By: Maurine Simmering M.D.   On: 12/23/2021 08:07   DG CHEST PORT 1 VIEW  Result Date: 12/22/2021 CLINICAL DATA:  Central line EXAM: PORTABLE CHEST 1 VIEW  COMPARISON:  Chest x-ray 12/22/2021 FINDINGS: There is a new left-sided central venous catheter with distal tip projecting over the brachiocephalic SVC junction. The aorta is tortuous, unchanged. Heart size is within normal limits. There is no focal lung infiltrate, pleural effusion or pneumothorax. No acute fractures are seen. IMPRESSION: New left-sided central venous catheter with distal tip projecting over the brachiocephalic SVC junction. No pneumothorax. Electronically Signed   By: Ronney Asters M.D.   On: 12/22/2021 22:49   DG Chest Port 1 View  Result Date: 12/22/2021 CLINICAL DATA:  Altered mental status and cough. EXAM: PORTABLE CHEST 1 VIEW COMPARISON:  Chest radiograph 11/15/2021 FINDINGS: Patient appears to be slightly rotated towards the right. Heart and mediastinum are within normal limits and stable. Slightly coarse lung markings are suggestive for chronic changes. No focal airspace disease or pulmonary edema. Mild widening at the bilateral AC joints. Negative for a pneumothorax. IMPRESSION: No acute chest findings. Electronically Signed   By: Markus Daft M.D.   On: 12/22/2021 13:29    Labs:  CBC: Recent Labs    12/23/21  0310 12/24/21 0244 12/25/21 0541 12/25/21 0910  WBC 31.2* 18.5* 9.5 9.1  HGB 7.0* 7.2* 6.5* 6.6*  HCT 23.0* 23.7* 21.2* 20.4*  PLT 442* 444* 291 268    COAGS: Recent Labs    11/14/21 2053 11/15/21 0352 12/24/21 0244  INR 1.4* 1.5* 1.4*  APTT 48*  --   --     BMP: Recent Labs    12/22/21 2214 12/23/21 0310 12/24/21 0244 12/25/21 0541  NA 132* 133* 134* 132*  K 4.9 4.6 4.2 3.8  CL 107 106 110 108  CO2 16* 12* 17* 19*  GLUCOSE 110* 136* 129* 84  BUN 72* 64* 36* 20  CALCIUM 8.1* 8.3* 8.2* 7.7*  CREATININE 1.67* 1.46* 0.73 0.54  GFRNONAA 32* 38* >60 >60    LIVER FUNCTION TESTS: Recent Labs    11/14/21 2053 11/15/21 0352 11/18/21 0310 12/22/21 1342  BILITOT 1.0 0.7 0.6 1.0  AST 46* 50* 46* 29  ALT 29 32 40 21  ALKPHOS 139* 130* 101  146*  PROT 6.6 5.9* 5.2* 6.8  ALBUMIN 2.8* 2.3* 2.0* 2.2*    Assessment: [name] is a pleasantly confused 76 yo female s/p right percutaneous nephrostomy drain placement on 12/24/21 by Dr Earleen Newport. On review today, patient had hemoglobin of 6.5, prompting concern for possible bleed related to PCN placement. At time of exam, there was a dime-sized amount of blood on the dressing over the PCN insertion site, as well as a very slight pink tinge to the fluid from the drain. Patient's Nurse Practitioner was at bedside during exam, who stated her belief that the low hemoglobin was dilutional and not from an acute bleed. IR will continue to follow.   Plan: Continue TID flushes with 5 cc NS. Record output Q shift. Dressing changes QD or PRN if soiled.  Call IR APP or on call IR MD if difficulty flushing or sudden change in drain output.  Repeat imaging/possible drain injection once output < 10 mL/QD (excluding flush material). Consideration for drain removal if output is < 10 mL/QD (excluding flush material), pending discussion with the providing surgical service.  Discharge planning: Please contact IR APP or on call IR MD prior to patient d/c to ensure appropriate follow up plans are in place. Typically patient will follow up with IR clinic 10-14 days post d/c for repeat imaging/possible drain injection. IR scheduler will contact patient with date/time of appointment. Patient will need to flush drain QD with 5 cc NS, record output QD, dressing changes every 2-3 days or earlier if soiled.   IR will continue to follow - please call with questions or concerns.  Electronically Signed: Lura Em, PA-C 12/25/2021, 10:57 AM   I spent a total of 15 Minutes at the the patient's bedside AND on the patient's hospital floor or unit, greater than 50% of which was counseling/coordinating care for s/p right percutaneous nephrostomy drain placement.

## 2021-12-25 NOTE — Plan of Care (Signed)
  Problem: Education: Goal: Knowledge of General Education information will improve Description: Including pain rating scale, medication(s)/side effects and non-pharmacologic comfort measures Outcome: Progressing   Problem: Clinical Measurements: Goal: Will remain free from infection Outcome: Progressing Goal: Diagnostic test results will improve Outcome: Progressing   Problem: Nutrition: Goal: Adequate nutrition will be maintained Outcome: Progressing   Problem: Coping: Goal: Level of anxiety will decrease Outcome: Progressing   Problem: Safety: Goal: Ability to remain free from injury will improve Outcome: Progressing

## 2021-12-25 NOTE — Progress Notes (Signed)
Nephrostomy drain dressing changed at 1700 on 9/30.

## 2021-12-26 DIAGNOSIS — A419 Sepsis, unspecified organism: Secondary | ICD-10-CM | POA: Diagnosis not present

## 2021-12-26 DIAGNOSIS — R6521 Severe sepsis with septic shock: Secondary | ICD-10-CM | POA: Diagnosis not present

## 2021-12-26 LAB — CBC
HCT: 23.9 % — ABNORMAL LOW (ref 36.0–46.0)
Hemoglobin: 7.6 g/dL — ABNORMAL LOW (ref 12.0–15.0)
MCH: 27.4 pg (ref 26.0–34.0)
MCHC: 31.8 g/dL (ref 30.0–36.0)
MCV: 86.3 fL (ref 80.0–100.0)
Platelets: 269 10*3/uL (ref 150–400)
RBC: 2.77 MIL/uL — ABNORMAL LOW (ref 3.87–5.11)
RDW: 16.8 % — ABNORMAL HIGH (ref 11.5–15.5)
WBC: 8.1 10*3/uL (ref 4.0–10.5)
nRBC: 0 % (ref 0.0–0.2)

## 2021-12-26 LAB — TYPE AND SCREEN
ABO/RH(D): O POS
Antibody Screen: NEGATIVE
Unit division: 0

## 2021-12-26 LAB — BASIC METABOLIC PANEL
Anion gap: 6 (ref 5–15)
BUN: 14 mg/dL (ref 8–23)
CO2: 20 mmol/L — ABNORMAL LOW (ref 22–32)
Calcium: 7.8 mg/dL — ABNORMAL LOW (ref 8.9–10.3)
Chloride: 108 mmol/L (ref 98–111)
Creatinine, Ser: 0.51 mg/dL (ref 0.44–1.00)
GFR, Estimated: >60 mL/min (ref >60)
Glucose, Bld: 89 mg/dL (ref 70–99)
Potassium: 3.5 mmol/L (ref 3.5–5.1)
Sodium: 134 mmol/L — ABNORMAL LOW (ref 135–145)

## 2021-12-26 LAB — BPAM RBC
Blood Product Expiration Date: 202311022359
ISSUE DATE / TIME: 202309301101
Unit Type and Rh: 5100

## 2021-12-26 LAB — PHOSPHORUS: Phosphorus: 2.3 mg/dL — ABNORMAL LOW (ref 2.5–4.6)

## 2021-12-26 LAB — MAGNESIUM: Magnesium: 1.8 mg/dL (ref 1.7–2.4)

## 2021-12-26 MED ORDER — MAGNESIUM SULFATE 2 GM/50ML IV SOLN
2.0000 g | Freq: Once | INTRAVENOUS | Status: AC
  Administered 2021-12-26: 2 g via INTRAVENOUS
  Filled 2021-12-26: qty 50

## 2021-12-26 MED ORDER — MIDODRINE HCL 5 MG PO TABS
5.0000 mg | ORAL_TABLET | Freq: Three times a day (TID) | ORAL | Status: DC
  Administered 2021-12-26 – 2021-12-29 (×11): 5 mg via ORAL
  Filled 2021-12-26 (×11): qty 1

## 2021-12-26 MED ORDER — POTASSIUM PHOSPHATES 15 MMOLE/5ML IV SOLN
15.0000 mmol | Freq: Once | INTRAVENOUS | Status: AC
  Administered 2021-12-26: 15 mmol via INTRAVENOUS
  Filled 2021-12-26: qty 5

## 2021-12-26 MED ORDER — POTASSIUM CHLORIDE CRYS ER 20 MEQ PO TBCR
20.0000 meq | EXTENDED_RELEASE_TABLET | Freq: Once | ORAL | Status: AC
  Administered 2021-12-26: 20 meq via ORAL
  Filled 2021-12-26: qty 1

## 2021-12-26 NOTE — Progress Notes (Addendum)
PROGRESS NOTE                                                                                                                                                                                                             Patient Demographics:    [name], is a 76 y.o. female, DOB - 08-12-1945, CHE:[id number]  Outpatient Primary MD for the patient is Mal Amabile, Anthony Sar, MD    LOS - 4  Admit date - 12/22/2021    Chief Complaint  Patient presents with   Altered Mental Status       Brief Narrative (HPI from H&P)    76 y.o. female, who presented to the [facility] from [facility] with a chief complaint of AMS.  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  She was diagnosed with right-sided kidney stone, urosepsis, she was transferred to [facility], ICU for further care.  She was placed on IV pressors for a few days, stabilized, she was seen by urology, IR underwent right nephrostomy tube placement.  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  She was stabilized and transferred to hospitalist service on 12/26/2021 on day 4 of her hospital stay.   Subjective:    [name] today has, No headache, No chest pain, No abdominal pain - No Nausea, No new weakness tingling or numbness, no shortness of breath   Assessment  & Plan :   Septic shock caused by right obstructing ureteric stone with pyelonephritis.  She has been seen by IR, urology and PCCM, briefly required IV pressors, sepsis pathophysiology has resolved, culture data inconclusive, she is on empiric antibiotics and improving.  Underwent right-sided nephrostomy tube placement by IR on 12/24/2021.  Defer management of this problem to IR  and urology.  Continue antibiotics.  Will request ID to titrate her antibiotics regimen.  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  Nonspecific possible rectal wall adjacent intramural abscess.  General surgery following, antibiotics per ID, general surgery contemplating diverting colostomy to help with the decubitus ulcer heal.  Defer management of this issue  to the general surgery.  Baseline paraplegia due to spinal ischemic injury in the past, stage sacral decubitus ulcer present on admission.  Indwelling Foley catheter.  Foley catheter was changed on 12/25/2021, wound care and supportive care continue for her chronic issues.  AKI due to sepsis.  Improved.  Hypokalemia, hypophosphatemia, hypomagnesemia.  Replaced.  GERD.  On Pepcid.  Hypothyroidism.  Stable TSH continue Synthroid.  Anemia of chronic disease.  Monitor, some fall due to heme dilution and frequent lab draws.    DM type II.  On sliding scale.  Lab Results  Component Value Date   HGBA1C 6.0 (H) 11/15/2021   CBG (last 3)  Recent Labs    12/25/21 0739 12/25/21 1109 12/25/21 1529  GLUCAP 75 99 95    12/24/2021.  Right nephrostomy tube placed 12/25/2021.  Indwelling Foley catheter exchanged. 12/25/2021. L.Arm PICC   Discharge planning for the Right nephrostomy tube per IR: Please contact IR APP or on call IR MD prior to patient d/c to ensure appropriate follow up plans are in place. Typically patient will follow up with IR clinic 10-14 days post d/c for repeat imaging/possible drain injection. IR scheduler will contact patient with date/time of appointment. Patient will need to flush drain QD with 5 cc NS, record output QD, dressing changes every 2-3 days or earlier if soiled.        Condition - Extremely Guarded  Family Communication  : Daughter [name] [phone] on 12/26/2021 at 9:56 AM  Code Status :  Full  Consults  : PCCM, urology, general surgery, urology, IR, ID, palliative care  PUD Prophylaxis :   Significant events and procedures  :     9/27 Presented to [facility], meropenem, PCCM consult CT abdomen/pelvis 9/28 >> cirrhotic changes without mass, normal gallbladder, severe right hydronephrosis with chronic right nephrolithiasis proximally and distally.  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  There is a 51m low-density area in the rectal wall along the lateral aspect that could represent a small intramural abscess.  Decubitus ulcer overlying the right ischial tuberosity extends almost to bone C. difficile toxin 9/28 >> negative Urine culture 9/27 >> multiple species, needs to be recollected Blood cultures 9/27 >>  12/24/2021.  Right nephrostomy tube placed 12/25/2021.  Indwelling Foley catheter exchanged. 12/25/2021. L.Arm PICC 12/26/2021.  [facility]     Disposition Plan  :    Status is: Inpatient  DVT Prophylaxis  :    heparin injection 5,000 Units Start: 12/22/21 2200 SCDs Start: 12/22/21 1926  Lab Results  Component Value Date   PLT 269 12/26/2021    Diet :  Diet Order             Diet regular Room service appropriate? Yes; Fluid consistency: Thin  Diet effective now                    Inpatient Medications  Scheduled Meds:  aspirin  81 mg Oral Daily   atorvastatin  10 mg Oral Daily   buPROPion  300 mg Oral Daily   Chlorhexidine Gluconate Cloth  6 each Topical Daily   ezetimibe  10 mg Oral QHS   famotidine  20 mg Oral BID   gabapentin  300 mg Oral Q8H   heparin  5,000 Units Subcutaneous Q8H   levothyroxine  175 mcg Oral Q0600   mouth rinse  15 mL Mouth Rinse  4 times per day   sodium chloride flush  10-40 mL Intracatheter Q12H   Continuous Infusions:  sodium chloride Stopped (12/22/21 2250)   linezolid (ZYVOX)  IV 600 mg (12/25/21 2145)   meropenem (MERREM) IV 1 g (12/26/21 0508)   potassium PHOSPHATE IVPB (in mmol) 43 mL/hr at 12/26/21 0800   PRN Meds:.acetaminophen, docusate sodium, ipratropium-albuterol, lip balm, mouth rinse, polyethylene glycol  Time Spent in minutes  30   Lala Lund M.D on 12/26/2021 at 9:44 AM  To page go to [email or web address]   [facility] -  Office  [phone]  See all Orders from today for further details    Objective:   Vitals:   12/26/21 0630 12/26/21 0700 12/26/21 0800 12/26/21 0900  BP: 113/61 108/62 108/65 (!) 95/54  Pulse: 80 84 90 92  Resp: (!) 21 (!) 21 10 (!) 21  Temp:   98 F (36.7 C)   TempSrc:   Oral   SpO2: 100% 100% 100% 100%  Weight:      Height:        Wt Readings from Last 3 Encounters:  12/26/21 119.1 kg  11/18/21 114 kg  06/04/20 111.6 kg     Intake/Output Summary (Last 24 hours) at 12/26/2021 0944 Last data filed at 12/26/2021 0800 Gross per 24 hour  Intake 723.9 ml  Output 980 ml  Net -256.1 ml     Physical Exam  Awake Alert, No new F.N deficits, Normal affect [place].AT,PERRAL Supple Neck, No JVD,   Symmetrical Chest wall movement, Good air movement bilaterally, CTAB RRR,No Gallops,Rubs or new Murmurs,  +ve B.Sounds, Abd Soft, No tenderness,   Bilateral paraplegia, both ankles with chronic decubitus ulcers present on admission under bandage, indwelling Foley catheter switched on 12/25/2021, right nephrostomy tube.    RN pressure injury documentation: Pressure Injury 11/15/21 Buttocks Right;Lower Deep Tissue Pressure Injury - Purple or maroon localized area of discolored intact skin or blood-filled blister due to damage of underlying soft tissue from pressure and/or shear. (Active)  11/15/21   Location: Buttocks  Location Orientation: Right;Lower  Staging: Deep Tissue Pressure Injury - Purple or maroon localized area of discolored intact skin or blood-filled blister due to damage of underlying soft tissue from  pressure and/or shear.  Wound Description (Comments):   Present on Admission: Yes  Dressing Type None 12/26/21 0800     Pressure Injury 12/22/21 Thigh Left;Posterior Unstageable - Full thickness tissue loss in which the base of the injury is covered by slough (yellow, tan, gray, green or brown) and/or eschar (tan, brown or black) in the wound bed. slough present (Active)  12/22/21 1915  Location: Thigh  Location Orientation: Left;Posterior  Staging: Unstageable - Full thickness tissue loss in which the base of the injury is covered by slough (yellow, tan, gray, green or brown) and/or eschar (tan, brown or black) in the wound bed.  Wound Description (Comments): slough present  Present on Admission: Yes  Dressing Type Foam - Lift dressing to assess site every shift 12/26/21 0800     Pressure Injury 12/22/21 Thigh Distal;Left;Posterior Unstageable - Full thickness tissue loss in which the base of the injury is covered by slough (yellow, tan, gray, green or brown) and/or eschar (tan, brown or black) in the wound bed. (Active)  12/22/21 1915  Location: Thigh  Location Orientation: Distal;Left;Posterior  Staging: Unstageable - Full thickness tissue loss in which the base of the injury is covered by slough (yellow, tan, gray, green or brown) and/or eschar (tan, brown or black)  in the wound bed.  Wound Description (Comments):   Present on Admission: Yes  Dressing Type Foam - Lift dressing to assess site every shift 12/26/21 0800     Pressure Injury 12/22/21 Ischial tuberosity Left Stage 4 - Full thickness tissue loss with exposed bone, tendon or muscle. deep tunneling wound (Active)  12/22/21 1915  Location: Ischial tuberosity  Location Orientation: Left  Staging: Stage 4 - Full thickness tissue loss with exposed bone, tendon or muscle.  Wound Description (Comments): deep tunneling wound  Present on Admission: Yes  Dressing Type Foam - Lift dressing to assess site every shift;Normal saline  moist dressing 12/26/21 0800     Data Review:    CBC Recent Labs  Lab 12/22/21 1342 12/22/21 2123 12/24/21 0244 12/25/21 0541 12/25/21 0910 12/25/21 1831 12/26/21 0213  WBC 35.7*   < > 18.5* 9.5 9.1 10.2 8.1  HGB 7.1*   < > 7.2* 6.5* 6.6* 8.4* 7.6*  HCT 23.8*   < > 23.7* 21.2* 20.4* 26.5* 23.9*  PLT 470*   < > 444* 291 268 299 269  MCV 89.8   < > 86.8 85.8 85.4 86.0 86.3  MCH 26.8   < > 26.4 26.3 27.6 27.3 27.4  MCHC 29.8*   < > 30.4 30.7 32.4 31.7 31.8  RDW 17.2*   < > 17.1* 16.8* 16.9* 16.8* 16.8*  LYMPHSABS 1.9  --  1.6  --   --   --   --   MONOABS 1.9*  --  0.9  --   --   --   --   EOSABS 0.1  --  0.1  --   --   --   --   BASOSABS 0.1  --  0.1  --   --   --   --    < > = values in this interval not displayed.    Electrolytes Recent Labs  Lab 12/22/21 1342 12/22/21 2214 12/23/21 0310 12/24/21 0244 12/25/21 0541 12/26/21 0213  NA 127* 132* 133* 134* 132* 134*  K 5.9* 4.9 4.6 4.2 3.8 3.5  CL 102 107 106 110 108 108  CO2 12* 16* 12* 17* 19* 20*  GLUCOSE 80 110* 136* 129* 84 89  BUN 83* 72* 64* 36* 20 14  CREATININE 1.72* 1.67* 1.46* 0.73 0.54 0.51  CALCIUM 8.2* 8.1* 8.3* 8.2* 7.7* 7.8*  AST 29  --   --   --   --   --   ALT 21  --   --   --   --   --   ALKPHOS 146*  --   --   --   --   --   BILITOT 1.0  --   --   --   --   --   ALBUMIN 2.2*  --   --   --   --   --   MG 2.8* 2.5* 2.4  --  1.7 1.8  PROCALCITON  --  0.84  --   --   --   --   LATICACIDVEN 1.1  --   --   --   --   --   INR  --   --   --  1.4*  --   --   TSH  --  3.775  --   --   --   --   AMMONIA 14  --   --   --   --   --   BNP 38.0  --   --   --   --   --  ID Labs Recent Labs  Lab 12/22/21 1342 12/22/21 2123 12/22/21 2214 12/23/21 0310 12/24/21 0244 12/25/21 0541 12/25/21 0910 12/25/21 1831 12/26/21 0213  WBC 35.7*   < >  --  31.2* 18.5* 9.5 9.1 10.2 8.1  PLT 470*   < >  --  442* 444* 291 268 299 269  PROCALCITON  --   --  0.84  --   --   --   --   --   --   LATICACIDVEN 1.1   --   --   --   --   --   --   --   --   CREATININE 1.72*  --  1.67* 1.46* 0.73 0.54  --   --  0.51   < > = values in this interval not displayed.    Radiology Reports [place] EKG SITE RITE  Result Date: 12/25/2021 If Site Rite image not attached, placement could not be confirmed due to current cardiac rhythm.  IR NEPHROSTOMY PLACEMENT RIGHT  Result Date: 12/24/2021 INDICATION: 76 year old female referred for percutaneous nephrostomy. Current sepsis and a history of paraplegia EXAM: IMAGE GUIDED PERCUTANEOUS NEPHROSTOMY COMPARISON:  None MEDICATIONS: 500 mg IV Levaquin ANESTHESIA/SEDATION: Moderate (conscious) sedation was employed during this procedure. A total of Versed 0.5 mg and Fentanyl 25 mcg was administered intravenously by the radiology nurse. Total intra-service moderate Sedation Time: 13 minutes. The patient's level of consciousness and vital signs were monitored continuously by radiology nursing throughout the procedure under my direct supervision. CONTRAST:  15 cc-administered into the collecting system(s) FLUOROSCOPY: Radiation Exposure Index (as provided by the fluoroscopic device): 2 mGy Kerma COMPLICATIONS: None PROCEDURE: Informed written consent was obtained from the patient and the patient's family after a thorough discussion of the procedural risks, benefits and alternatives. All questions were addressed. Maximal Sterile Barrier Technique was utilized including caps, mask, sterile gowns, sterile gloves, sterile drape, hand hygiene and skin antiseptic. A timeout was performed prior to the initiation of the procedure. Patient positioned prone position on the fluoroscopy table. Ultrasound survey of the right flank was performed with images stored and sent to PACs. The patient was then prepped and draped in the usual sterile fashion. 1% lidocaine was used to anesthetize the skin and subcutaneous tissues for local anesthesia. A Chiba needle was then used to access a posterior inferior calyx  with ultrasound guidance. With spontaneous urine returned through the needle, passage of an 018 micro wire into the collecting system was performed under fluoroscopy. A small incision was made with an 11 blade scalpel, and the needle was removed from the wire. An Accustick system was then advanced over the wire into the collecting system under fluoroscopy. The metal stiffener and inner dilator were removed. Contrast was injected confirming location in the collecting system. Bentson wire was passed into the collecting system and the sheath removed. Ten French dilation of the soft tissues was performed. Using modified Seldinger technique, a 10 French pigtail catheter drain was placed over the Bentson wire. Wire and inner stiffener removed, and the pigtail was formed in the collecting system. Approximately 110 cc of frank pyonephrosis was aspirated. Sample was sent to the lab. Small amount of contrast confirmed position of the catheter. Patient tolerated the procedure well and remained hemodynamically stable throughout. No complications were encountered and no significant blood loss encountered IMPRESSION: Status post image guided right-sided percutaneous nephrostomy, for decompression of frank pyonephrosis. Signed, Dulcy Fanny. Nadene Rubins, RPVI [facility] Electronically Signed  By: Corrie Mckusick D.O.   On: 12/24/2021 09:31   CT ABDOMEN PELVIS W CONTRAST  Result Date: 12/23/2021 CLINICAL DATA:  Sacral wound leaking stool. EXAM: CT ABDOMEN AND PELVIS WITH CONTRAST TECHNIQUE: Multidetector CT imaging of the abdomen and pelvis was performed using the standard protocol following bolus administration of intravenous contrast. RADIATION DOSE REDUCTION: This exam was performed according to the departmental dose-optimization program which includes automated exposure control, adjustment of the mA and/or kV according to patient size and/or use of iterative  reconstruction technique. CONTRAST:  77m OMNIPAQUE IOHEXOL 350 MG/ML SOLN COMPARISON:  05/21/2020 FINDINGS: Lower chest: Trace bilateral pleural effusions. Bibasilar atelectasis. Hepatobiliary: Cirrhosis without focal hepatic mass. No gallstones, gallbladder wall thickening, or biliary dilatation. Pancreas: Unremarkable. No pancreatic ductal dilatation or surrounding inflammatory changes. Spleen: Normal in size without focal abnormality. Adrenals/Urinary Tract: Adrenal glands are unremarkable. Stable bilateral renal cysts for which no further evaluation is recommended. Severe right hydronephrosis. Right nephrolithiasis with the largest in the renal pelvis measuring 3.6 cm. 4 mm right mid ureteral calculus. 7 mm right distal ureteral calculus. Decompressed bladder. Foley catheter in the bladder. Stomach/Bowel: No bowel dilatation to suggest bowel obstruction. Bowel wall thickening of the rectosigmoid colon consistent with proctocolitis. 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 16 mm low-density area in the rectal wall along the left lateral aspect likely reflecting a small intramural abscess. Vascular/Lymphatic: Normal caliber abdominal aorta with mild atherosclerosis. No lymphadenopathy. Reproductive: Status post hysterectomy. No adnexal masses. Other: No abdominal wall hernia or abnormality. No abdominopelvic ascites. Sacral decubitus ulcer. Decubitus ulcer overlying the right ischial tuberosity. Generalized osteopenia. Mild osteoarthritis of bilateral SI joints. Mild osteoarthritis of the right hip. Musculoskeletal: No acute osseous abnormality. No aggressive osseous lesion. Diffuse thoracolumbar spondylosis. 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 IMPRESSION: 1. Bowel wall thickening of the rectosigmoid colon consistent with proctocolitis. 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 16 mm low-density  area in the rectal wall along the left lateral aspect likely reflecting a small intramural abscess. 2. Severe right hydronephrosis. Right nephrolithiasis with the largest in the renal pelvis measuring 3.6 cm. 4 mm right mid ureteral calculus. 7 mm right distal ureteral calculus. 3. Decubitus ulcer overlying the right ischial tuberosity. Electronically Signed   By: HKathreen DevoidM.D.   On: 12/23/2021 14:13   DG Chest Port 1 View  Result Date: 12/23/2021 CLINICAL DATA:  Respiratory failure EXAM: PORTABLE CHEST 1 VIEW COMPARISON:  Radiograph 12/22/2021 FINDINGS: Left neck approach catheter tip overlies the proximal SVC. Unchanged cardiomediastinal silhouette with unchanged tortuosity of the aorta. New bandlike left basilar opacity, likely atelectasis. No large effusion. No evidence of pneumothorax. Bones are unchanged. IMPRESSION: New bandlike left basilar opacity, favored to be atelectasis. Continued radiographic follow-up recommended. Electronically Signed   By: JMaurine SimmeringM.D.   On: 12/23/2021 08:07   DG CHEST PORT 1 VIEW  Result Date: 12/22/2021 CLINICAL DATA:  Central line EXAM: PORTABLE CHEST 1 VIEW COMPARISON:  Chest x-ray 12/22/2021 FINDINGS: There is a new left-sided central venous catheter with distal tip projecting over the brachiocephalic SVC junction. The aorta is tortuous, unchanged. Heart size is within normal limits. There is no focal lung infiltrate, pleural effusion or pneumothorax. No acute fractures are seen. IMPRESSION: New left-sided central venous catheter with distal tip projecting over the brachiocephalic SVC junction. No pneumothorax. Electronically Signed   By: ARonney AstersM.D.   On: 12/22/2021 22:49   DG  Chest Port 1 View  Result Date: 12/22/2021 CLINICAL DATA:  Altered mental status and cough. EXAM: PORTABLE CHEST 1 VIEW COMPARISON:  Chest radiograph 11/15/2021 FINDINGS: Patient appears to be slightly rotated towards the right. Heart and mediastinum are within normal limits  and stable. Slightly coarse lung markings are suggestive for chronic changes. No focal airspace disease or pulmonary edema. Mild widening at the bilateral AC joints. Negative for a pneumothorax. IMPRESSION: No acute chest findings. Electronically Signed   By: Markus Daft M.D.   On: 12/22/2021 13:29

## 2021-12-26 NOTE — Progress Notes (Signed)
(+)   urine return from foley, per Vernie Shanks, Therapist, sports. Patrici Ranks A

## 2021-12-26 NOTE — Progress Notes (Signed)
Coude cath placed per orders. Patient tolerated without complaint of. No immediate urine return. Recently incontinent. Bladder scan=0. [name] A

## 2021-12-26 NOTE — Plan of Care (Signed)
  Problem: Education: Goal: Knowledge of General Education information will improve Description: Including pain rating scale, medication(s)/side effects and non-pharmacologic comfort measures Outcome: Progressing   Problem: Clinical Measurements: Goal: Diagnostic test results will improve Outcome: Progressing Goal: Respiratory complications will improve Outcome: Progressing Goal: Cardiovascular complication will be avoided Outcome: Progressing   Problem: Activity: Goal: Risk for activity intolerance will decrease Outcome: Progressing   Problem: Nutrition: Goal: Adequate nutrition will be maintained Outcome: Progressing   

## 2021-12-26 NOTE — Progress Notes (Signed)
[facility] ADULT ICU REPLACEMENT PROTOCOL   The patient does apply for the [facility] Adult ICU Electrolyte Replacment Protocol based on the criteria listed below:   1.Exclusion criteria: TCTS patients, ECMO patients, and Dialysis patients 2. Is GFR >/= 30 ml/min? Yes.    Patient's GFR today is >60 3. Is SCr </= 2? Yes.   Patient's SCr is 0.51 mg/dL 4. Did SCr increase >/= 0.5 in 24 hours? No. 5.Pt's weight >40kg  Yes.   6. Abnormal electrolyte(s): Phos, K, Mag  7. Electrolytes replaced per protocol 8.  Call MD STAT for K+ </= 2.5, Phos </= 1, or Mag </= 1 Physician:  Dani Gobble E Tamrah Victorino 12/26/2021 5:24 AM

## 2021-12-27 DIAGNOSIS — N12 Tubulo-interstitial nephritis, not specified as acute or chronic: Secondary | ICD-10-CM | POA: Diagnosis not present

## 2021-12-27 DIAGNOSIS — E875 Hyperkalemia: Secondary | ICD-10-CM | POA: Diagnosis not present

## 2021-12-27 DIAGNOSIS — N200 Calculus of kidney: Secondary | ICD-10-CM

## 2021-12-27 DIAGNOSIS — N1330 Unspecified hydronephrosis: Secondary | ICD-10-CM

## 2021-12-27 DIAGNOSIS — R4182 Altered mental status, unspecified: Secondary | ICD-10-CM

## 2021-12-27 DIAGNOSIS — E871 Hypo-osmolality and hyponatremia: Secondary | ICD-10-CM | POA: Diagnosis not present

## 2021-12-27 DIAGNOSIS — Z789 Other specified health status: Secondary | ICD-10-CM

## 2021-12-27 DIAGNOSIS — A419 Sepsis, unspecified organism: Secondary | ICD-10-CM | POA: Diagnosis not present

## 2021-12-27 DIAGNOSIS — R6521 Severe sepsis with septic shock: Secondary | ICD-10-CM | POA: Diagnosis not present

## 2021-12-27 DIAGNOSIS — Z515 Encounter for palliative care: Secondary | ICD-10-CM

## 2021-12-27 LAB — CBC
HCT: 23.2 % — ABNORMAL LOW (ref 36.0–46.0)
Hemoglobin: 7.1 g/dL — ABNORMAL LOW (ref 12.0–15.0)
MCH: 26.7 pg (ref 26.0–34.0)
MCHC: 30.6 g/dL (ref 30.0–36.0)
MCV: 87.2 fL (ref 80.0–100.0)
Platelets: 252 10*3/uL (ref 150–400)
RBC: 2.66 MIL/uL — ABNORMAL LOW (ref 3.87–5.11)
RDW: 16.9 % — ABNORMAL HIGH (ref 11.5–15.5)
WBC: 8 10*3/uL (ref 4.0–10.5)
nRBC: 0 % (ref 0.0–0.2)

## 2021-12-27 LAB — CULTURE, BLOOD (ROUTINE X 2)
Culture: NO GROWTH
Culture: NO GROWTH
Special Requests: ADEQUATE

## 2021-12-27 LAB — GLUCOSE, CAPILLARY
Glucose-Capillary: 98 mg/dL (ref 70–99)
Glucose-Capillary: 116 mg/dL — ABNORMAL HIGH (ref 70–99)
Glucose-Capillary: 94 mg/dL (ref 70–99)

## 2021-12-27 LAB — BASIC METABOLIC PANEL
Anion gap: 5 (ref 5–15)
BUN: 13 mg/dL (ref 8–23)
CO2: 19 mmol/L — ABNORMAL LOW (ref 22–32)
Calcium: 6.7 mg/dL — ABNORMAL LOW (ref 8.9–10.3)
Chloride: 113 mmol/L — ABNORMAL HIGH (ref 98–111)
Creatinine, Ser: 0.49 mg/dL (ref 0.44–1.00)
GFR, Estimated: >60 mL/min (ref >60)
Glucose, Bld: 90 mg/dL (ref 70–99)
Potassium: 3 mmol/L — ABNORMAL LOW (ref 3.5–5.1)
Sodium: 137 mmol/L (ref 135–145)

## 2021-12-27 LAB — MAGNESIUM: Magnesium: 1.7 mg/dL (ref 1.7–2.4)

## 2021-12-27 LAB — PHOSPHORUS: Phosphorus: 2.6 mg/dL (ref 2.5–4.6)

## 2021-12-27 MED ORDER — SODIUM CHLORIDE 0.9 % IV SOLN
1000.0000 mg | Freq: Three times a day (TID) | INTRAVENOUS | Status: DC
  Administered 2021-12-27 – 2021-12-28 (×3): 1000 mg via INTRAVENOUS
  Filled 2021-12-27 (×7): qty 20

## 2021-12-27 MED ORDER — POTASSIUM CHLORIDE CRYS ER 20 MEQ PO TBCR: 20.0000 meq | EXTENDED_RELEASE_TABLET | Freq: Once | ORAL | Status: DC

## 2021-12-27 MED ORDER — POTASSIUM CHLORIDE CRYS ER 20 MEQ PO TBCR
40.0000 meq | EXTENDED_RELEASE_TABLET | Freq: Once | ORAL | Status: AC
  Administered 2021-12-27: 40 meq via ORAL
  Filled 2021-12-27: qty 2

## 2021-12-27 NOTE — Plan of Care (Signed)

## 2021-12-27 NOTE — Consult Note (Signed)
Consultation Note Date: 12/27/2021   Patient Name: [name]  DOB: September 13, 1945  MRN: [id number]  Age / Sex: 76 y.o., female  PCP: Hilbert Corrigan, MD Referring Physician: [name], MD  Reason for Consultation: Establishing goals of care  HPI/Patient Profile:  76 y.o. female, who presented to the [facility] from [facility] wth a chief complaint of [place].  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  She was diagnosed with right-sided kidney stone, urosepsis, she was transferred to [facility], ICU for further care.  She was placed on IV pressors for a few days, stabilized, she was seen by urology, IR underwent right nephrostomy tube placement.  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  She was stabilized and transferred to hospitalist service. PMT consulted for [place].   Clinical Assessment and Goals of Care: I have reviewed medical records including EPIC notes, labs and imaging, assessed the patient and then met with [name]  to discuss diagnosis prognosis, GOC, EOL wishes, disposition and options.  I introduced Palliative Medicine as specialized medical care for people living with serious illness. It focuses on providing relief from the symptoms and stress of a serious illness. The goal is to improve quality of life for both the patient and the family.  We discussed a brief life review of the patient. She states that she is was born in [place] but moved to [place] when she was 35 months old and has lived there ever since. She used to work in a [employer] and states she was married many times. She admits to only having one daughter - [name] - who also lives in  [place].   As far as functional and nutritional status, [name] lives in a SNF where she does not get out of the bed. She states that the staff there could get here out of the bed but she chooses to stay in the bed. She denies any changes in her appetite.   We discussed patient's current illness and what it means in the larger context of patient's on-going co-morbidities. Natural disease trajectory and expectations at EOL were discussed. She is aware of her PMH as well as her current presentation knowing that she may need a surgical procedure for a colostomy. She is willing to have the surgical procedure if necessary for her to continue to live. As of right now she states that she feels better there where she was on admission.   I attempted to elicit values and goals of care important to the patient. Advance directives, concepts specific to code status, artificial feeding and hydration, and rehospitalization were considered and discussed. Allyssia was clear that she would be okay with being intubated with MV support and artifical nutrition including PEG for long term if she was still able to cognitively function. She  states she would not want to be a "vegetable", and if she was incoherent laying in the bed she would not want or live with these aggressive medical therapies.   I presented the patient with living will forms and advised her on the importance to fill these out. Together we filled out a MOST form and education was provided throughout this process.   I completed a MOST form today. The patient outlined her wishes for the following treatment decisions:  Cardiopulmonary Resuscitation: Attempt Resuscitation (CPR)  Medical Interventions: Full Scope of Treatment: Use intubation, advanced airway interventions, mechanical ventilation, cardioversion as indicated, medical treatment, IV fluids, etc, also provide comfort measures. Transfer to the hospital if indicated  Antibiotics: Antibiotics if indicated  IV  Fluids: IV fluids if indicated  Feeding Tube: Feeding tube long-term if indicated     Discussed with patient/family the importance of continued conversation with family and the medical providers regarding overall plan of care and treatment options, ensuring decisions are within the context of the patient's values and GOCs.    Questions and concerns were addressed. The patient was encouraged to call with questions or concerns.   Primary Decision Maker [place]   Code Status/Advance Care Planning: Full code  Prognosis:   Unable to determine  Discharge Planning: SNF   Primary Diagnoses: Present on Admission:  Septic shock [facility])   Physical Exam Cardiovascular:     Rate and Rhythm: Normal rate and regular rhythm.  Pulmonary:     Effort: Pulmonary effort is normal.     Breath sounds: Rhonchi present.  Abdominal:     General: Bowel sounds are normal.  Skin:    General: Skin is warm.  Neurological:     General: No focal deficit present.     Mental Status: She is alert.  Psychiatric:        Mood and Affect: Mood normal.     Palliative Assessment/Data: 30%     Thank you for this consult. Palliative medicine will continue to follow and assist holistically.   Time Total: 75 minutes Greater than 50%  of this time was spent counseling and coordinating care related to the above assessment and plan.  Signed by: Jordan Hawks, DNP, FNP-BC Palliative Medicine    Please contact Palliative Medicine Team phone at [phone] for questions and concerns.  For individual provider: See Shea Evans

## 2021-12-27 NOTE — Progress Notes (Signed)
Supervising Physician: Daryll Brod  Patient Status:  [facility] - In-pt  Chief Complaint:  3 days s/p R PCN  Subjective:  Feeling better Eating in bed Daughter at bedside  Allergies: Invanz [ertapenem], Propranolol hcl, Rocephin [ceftriaxone], and Vancomycin  Medications: Prior to Admission medications   Medication Sig Start Date End Date Taking? Authorizing Provider  acetaminophen (TYLENOL) 325 MG tablet Take 325 mg by mouth 2 (two) times daily. *May take one every 6 hours as needed for pain   Yes [provider]  allopurinol (ZYLOPRIM) 100 MG tablet Take 100 mg by mouth daily.  10/14/14  Yes [provider]  Amino Acids-Protein Hydrolys (FEEDING SUPPLEMENT, PRO-STAT 64,) LIQD Take 30 mLs by mouth 3 (three) times daily with meals.   Yes [provider]  aspirin EC 81 MG tablet Take 81 mg by mouth daily.   Yes [provider]  atorvastatin (LIPITOR) 10 MG tablet Take 10 mg by mouth daily.   Yes [provider]  azelastine (ASTELIN) 0.1 % nasal spray Place 2 sprays into both nostrils 2 (two) times daily. Use in each nostril as directed   Yes [provider]  b complex vitamins tablet Take 1 tablet by mouth daily.   Yes [provider]  buPROPion (WELLBUTRIN XL) 300 MG 24 hr tablet Take 300 mg by mouth daily.    Yes [provider]  Calcium Carb-Cholecalciferol (CALCIUM CARBONATE-VITAMIN D3 PO) Take 1 tablet by mouth in the morning and at bedtime.   Yes [provider]  carvedilol (COREG) 12.5 MG tablet Take 12.5 mg by mouth 2 (two) times daily with a meal.   Yes [provider]  Cranberry 500 MG TABS Take 1 capsule by mouth daily.   Yes [provider]  dextromethorphan-guaiFENesin (MUCINEX DM) 30-600 MG per 12 hr tablet Take 1 tablet by mouth every 12 (twelve) hours as needed for cough.    Yes [provider]  docusate sodium (COLACE) 100 MG capsule Take 100 mg by mouth at  bedtime.   Yes [provider]  ezetimibe (ZETIA) 10 MG tablet Take 10 mg by mouth at bedtime.    Yes [provider]  famotidine (PEPCID) 20 MG tablet Take 20 mg by mouth 2 (two) times daily.   Yes [provider]  ferrous sulfate 325 (65 FE) MG EC tablet Take 325 mg by mouth 3 (three) times daily with meals.   Yes [provider]  fluticasone (FLONASE) 50 MCG/ACT nasal spray Place 2 sprays into both nostrils daily.  09/03/14  Yes [provider]  furosemide (LASIX) 20 MG tablet Take 1 tablet (20 mg total) by mouth daily. 11/19/21  Yes Emokpae, Courage, MD  gabapentin (NEURONTIN) 300 MG capsule Take 300 mg by mouth every 8 (eight) hours.   Yes [provider]  ipratropium-albuterol (DUONEB) 0.5-2.5 (3) MG/3ML SOLN Take 3 mLs by nebulization every 6 (six) hours as needed (shortness of breath). 11/19/21  Yes Roxan Hockey, MD  Javier Docker Oil 300 MG CAPS Take 1 capsule by mouth 2 (two) times daily.   Yes [provider]  Lactobacillus (ACIDOPHILUS PO) Take 1 tablet by mouth in the morning and at bedtime.   Yes [provider]  levothyroxine (SYNTHROID) 150 MCG tablet Take 175 mcg by mouth daily.   Yes [provider]  lisinopril (ZESTRIL) 2.5 MG tablet Take 2.5 mg by mouth daily.   Yes [provider]  magnesium oxide (MAG-OX) 400 MG tablet Take 400  mg by mouth daily.   Yes [provider]  Menthol-Zinc Oxide (CALMOSEPTINE) 0.44-20.6 % OINT Apply 1 Application topically every 8 (eight) hours as needed (protection/prevention).   Yes [provider]  mirabegron ER (MYRBETRIQ) 25 MG TB24 tablet Take 25 mg by mouth daily.   Yes [provider]  Multiple Vitamins-Minerals (CERTAGEN PO) Take 1 tablet by mouth daily.   Yes [provider]  Nutritional Supplements (ARGINAID) PACK Take 1 packet by mouth daily.   Yes [provider]  oxycodone (OXY-IR) 5 MG capsule Take 1 capsule (5  mg total) by mouth in the morning and at bedtime. 11/19/21  Yes Emokpae, Courage, MD  pantoprazole (PROTONIX) 40 MG tablet Take 1 tablet (40 mg total) by mouth daily. 11/20/21  Yes Emokpae, Courage, MD  potassium chloride 20 MEQ TBCR Take 10 mEq by mouth daily. 04/06/16  Yes Kathie Dike, MD  tamsulosin (FLOMAX) 0.4 MG CAPS capsule Take 1 capsule (0.4 mg total) by mouth daily after supper. 11/19/21  Yes Emokpae, Courage, MD  topiramate (TOPAMAX) 50 MG tablet Take 50 mg by mouth 2 (two) times daily.   Yes [provider]  vitamin C (ASCORBIC ACID) 500 MG tablet Take 500 mg by mouth 2 (two) times daily.   Yes [provider]  Vitamin D, Ergocalciferol, (DRISDOL) 1.25 MG (50000 UNIT) CAPS capsule Take 50,000 Units by mouth every 7 (seven) days.   Yes [provider]  leptospermum manuka honey (MEDIHONEY) PSTE paste Apply 1 Application topically daily. Apply Medihoney to right and left inner  and outer thigh wounds and left lower buttock Q day, then cover with foam dressing.  Change foam dressings Q 3 days or PRN soiling. 2. Apply moist gauze packing to right middle buttock Q day, using swab to fill, then cover with foam dressing.  (Change foam dressing Q 3 days or PRN soiling.) 2. Foam dressings to right inner groin, left buttock, right lower buttock, right upper thigh.  Change Q 3 days or PRN soiling. Apply thin layer (3 mm) to wound. Patient not taking: Reported on 12/22/2021 11/20/21   Roxan Hockey, MD  SANTYL 250 UNIT/GM ointment Apply 1 Application topically daily. 12/22/21   [provider]  Wound Dressings (PROMOGRAN EX) Apply topically daily.    [provider]     Vital Signs: BP 120/69   Pulse 80   Temp 97.6 F (36.4 C) (Oral)   Resp 16   Ht '5\' 7"'$  (1.702 m)   Wt 262 lb 9.1 oz (119.1 kg)   SpO2 100%   BMI 41.12 kg/m   Physical Exam Constitutional:      General: She is not in acute distress.    Appearance: She is not toxic-appearing.   HENT:     Head: Normocephalic and atraumatic.     Mouth/Throat:     Pharynx: Oropharynx is clear.  Eyes:     Extraocular Movements: Extraocular movements intact.  Cardiovascular:     Rate and Rhythm: Normal rate.  Pulmonary:     Effort: Pulmonary effort is normal. No respiratory distress.  Abdominal:     Palpations: Abdomen is soft.  Skin:    General: Skin is warm and dry.  Neurological:     General: No focal deficit present.     Mental Status: She is alert and oriented to person, place, and time.  Psychiatric:        Mood and Affect: Mood normal.        Behavior: Behavior  normal.   R PCN- drain in place and intact.  Occlusive dressing in place. Over 200cc in gravity bag at time of exam.  Urine pink and cloudy     Imaging: [place] EKG SITE RITE  Result Date: 12/25/2021 If Site Rite image not attached, placement could not be confirmed due to current cardiac rhythm.  IR NEPHROSTOMY PLACEMENT RIGHT  Result Date: 12/24/2021 INDICATION: 76 year old female referred for percutaneous nephrostomy. Current sepsis and a history of paraplegia EXAM: IMAGE GUIDED PERCUTANEOUS NEPHROSTOMY COMPARISON:  None MEDICATIONS: 500 mg IV Levaquin ANESTHESIA/SEDATION: Moderate (conscious) sedation was employed during this procedure. A total of Versed 0.5 mg and Fentanyl 25 mcg was administered intravenously by the radiology nurse. Total intra-service moderate Sedation Time: 13 minutes. The patient's level of consciousness and vital signs were monitored continuously by radiology nursing throughout the procedure under my direct supervision. CONTRAST:  15 cc-administered into the collecting system(s) FLUOROSCOPY: Radiation Exposure Index (as provided by the fluoroscopic device): 2 mGy Kerma COMPLICATIONS: None PROCEDURE: Informed written consent was obtained from the patient and the patient's family after a thorough discussion of the procedural risks, benefits and alternatives. All questions were addressed. Maximal  Sterile Barrier Technique was utilized including caps, mask, sterile gowns, sterile gloves, sterile drape, hand hygiene and skin antiseptic. A timeout was performed prior to the initiation of the procedure. Patient positioned prone position on the fluoroscopy table. Ultrasound survey of the right flank was performed with images stored and sent to PACs. The patient was then prepped and draped in the usual sterile fashion. 1% lidocaine was used to anesthetize the skin and subcutaneous tissues for local anesthesia. A Chiba needle was then used to access a posterior inferior calyx with ultrasound guidance. With spontaneous urine returned through the needle, passage of an 018 micro wire into the collecting system was performed under fluoroscopy. A small incision was made with an 11 blade scalpel, and the needle was removed from the wire. An Accustick system was then advanced over the wire into the collecting system under fluoroscopy. The metal stiffener and inner dilator were removed. Contrast was injected confirming location in the collecting system. Bentson wire was passed into the collecting system and the sheath removed. Ten French dilation of the soft tissues was performed. Using modified Seldinger technique, a 10 French pigtail catheter drain was placed over the Bentson wire. Wire and inner stiffener removed, and the pigtail was formed in the collecting system. Approximately 110 cc of frank pyonephrosis was aspirated. Sample was sent to the lab. Small amount of contrast confirmed position of the catheter. Patient tolerated the procedure well and remained hemodynamically stable throughout. No complications were encountered and no significant blood loss encountered IMPRESSION: Status post image guided right-sided percutaneous nephrostomy, for decompression of frank pyonephrosis. Signed, Dulcy Fanny. Nadene Rubins, RPVI [facility] Radiology Electronically Signed   By:  Corrie Mckusick D.O.   On: 12/24/2021 09:31    Labs:  CBC: Recent Labs    12/25/21 0910 12/25/21 1831 12/26/21 0213 12/27/21 0459  WBC 9.1 10.2 8.1 8.0  HGB 6.6* 8.4* 7.6* 7.1*  HCT 20.4* 26.5* 23.9* 23.2*  PLT 268 299 269 252    COAGS: Recent Labs    11/14/21 2053 11/15/21 0352 12/24/21 0244  INR 1.4* 1.5* 1.4*  APTT 48*  --   --     BMP: Recent Labs    12/24/21 0244 12/25/21 0541 12/26/21 0213 12/27/21 0459  NA 134* 132* 134* 137  K 4.2  3.8 3.5 3.0*  CL 110 108 108 113*  CO2 17* 19* 20* 19*  GLUCOSE 129* 84 89 90  BUN 36* '20 14 13  '$ CALCIUM 8.2* 7.7* 7.8* 6.7*  CREATININE 0.73 0.54 0.51 0.49  GFRNONAA >60 >60 >60 >60    LIVER FUNCTION TESTS: Recent Labs    11/14/21 2053 11/15/21 0352 11/18/21 0310 12/22/21 1342  BILITOT 1.0 0.7 0.6 1.0  AST 46* 50* 46* 29  ALT 29 32 40 21  ALKPHOS 139* 130* 101 146*  PROT 6.6 5.9* 5.2* 6.8  ALBUMIN 2.8* 2.3* 2.0* 2.2*    Assessment and Plan:  Urinary obstruction --requiring R PCN placement, urine not yet clear --defer to primary team for further management --IR available for routine exchange (6-8 weeks) or sooner if needed.  Electronically Signed: Pasty Spillers, PA 12/27/2021, 4:24 PM   I spent a total of 15 Minutes at the the patient's bedside AND on the patient's hospital floor or unit, greater than 50% of which was counseling/coordinating care for R PCN.

## 2021-12-27 NOTE — Progress Notes (Signed)
Pharmacy Antibiotic Note  [name] is a 76 y.o. female admitted on 12/22/2021 with pyelonephritis now s/p R-nephrostomy placement on 9/29 with cultures growing Morganella + Proteus + E faecalis. Pharmacy has been consulted for Primaxin dosing.  Still waiting on Proteus and E faecalis sensitivities - but this will provide empiric coverage for now.   Plan: - Start Primaxin 1g IV every 8 hours - Will continue to follow renal function, culture results, LOT, and antibiotic de-escalation plans   Height: '5\' 7"'$  (170.2 cm) Weight: 119.1 kg (262 lb 9.1 oz) IBW/kg (Calculated) : 61.6  Temp (24hrs), Avg:98.5 F (36.9 C), Min:97.8 F (36.6 C), Max:99.8 F (37.7 C)  Recent Labs  Lab 12/22/21 1342 12/22/21 2123 12/23/21 0310 12/24/21 0244 12/25/21 0541 12/25/21 0910 12/25/21 1831 12/26/21 0213 12/27/21 0459  WBC 35.7*   < > 31.2* 18.5* 9.5 9.1 10.2 8.1 8.0  CREATININE 1.72*   < > 1.46* 0.73 0.54  --   --  0.51 0.49  LATICACIDVEN 1.1  --   --   --   --   --   --   --   --    < > = values in this interval not displayed.    Estimated Creatinine Clearance: 83.6 mL/min (by C-G formula based on SCr of 0.49 mg/dL).    Allergies  Allergen Reactions   Invanz [Ertapenem] Other (See Comments)    Patient states allergy but is not certain    Propranolol Hcl    Rocephin [Ceftriaxone] Hives   Vancomycin Hives    Antimicrobials this admission: Meropenem 9/27 >> 10/2 Linezolid 9/28 >> 10/2 Levaquin 9/29 x 1 Primaxin 10/2 >>  Dose adjustments this admission: N/a  Microbiology results: 9/27 UCx >> mult species 9/27 BCx >> [id number] 9/28 CDiff >> neg 9/29 R-kidney urine (s/p nephrostomy placement) >> Morgnella morganii + proteus mirabilis + enterococcus faecalis  Thank you for allowing pharmacy to be a part of this patient's care.  Alycia Rossetti, PharmD, BCPS Infectious Diseases Clinical Pharmacist 12/27/2021 1:29 PM   **Pharmacist phone directory can now be found on [email or web address] ([email or web address]  [email or web address]).  Listed under [place].

## 2021-12-27 NOTE — Plan of Care (Signed)
  Problem: Education: Goal: Knowledge of General Education information will improve Description: Including pain rating scale, medication(s)/side effects and non-pharmacologic comfort measures 12/27/2021 1557 by Sim Boast, RN Outcome: Progressing 12/27/2021 1421 by Sim Boast, RN Outcome: Progressing   Problem: Clinical Measurements: Goal: Ability to maintain clinical measurements within normal limits will improve Outcome: Progressing Goal: Will remain free from infection 12/27/2021 1557 by Sim Boast, RN Outcome: Progressing 12/27/2021 1421 by Sim Boast, RN Outcome: Progressing Goal: Respiratory complications will improve 12/27/2021 1557 by Sim Boast, RN Outcome: Progressing 12/27/2021 1421 by Sim Boast, RN Outcome: Progressing Goal: Cardiovascular complication will be avoided Outcome: Progressing   Problem: Activity: Goal: Risk for activity intolerance will decrease Outcome: Progressing   Problem: Nutrition: Goal: Adequate nutrition will be maintained Outcome: Progressing   Problem: Coping: Goal: Level of anxiety will decrease Outcome: Progressing

## 2021-12-27 NOTE — Progress Notes (Signed)
Subjective: CC: Sitting up in bed eating breakfast. No complaints.  Unsure how long she has had the wound Normally has 2 bm's day Lives at a SNF  Objective: Vital signs in last 24 hours: Temp:  [98 F (36.7 C)-99.8 F (37.7 C)] 99.8 F (37.7 C) (10/02 0733) Pulse Rate:  [69-91] 81 (10/02 0500) Resp:  [11-23] 20 (10/02 0500) BP: (86-119)/(51-91) 111/68 (10/02 0500) SpO2:  [97 %-100 %] 100 % (10/02 0500) Last BM Date : 12/26/21  Intake/Output from previous day: 10/01 0701 - 10/02 0700 In: 216.2 [IV Piggyback:216.2] Out: 1525 [Urine:1525] Intake/Output this shift: No intake/output data recorded.  PE: Gen:  Alert, NAD, pleasant Abd: Obese, soft, NT Wound: Seen with RN. Perianal tissue breakdown noted with large opening as noted below. Liquid stool decompressing from this area. RN re-pouched this area.     Lab Results:  Recent Labs    12/26/21 0213 12/27/21 0459  WBC 8.1 8.0  HGB 7.6* 7.1*  HCT 23.9* 23.2*  PLT 269 252   BMET Recent Labs    12/26/21 0213 12/27/21 0459  NA 134* 137  K 3.5 3.0*  CL 108 113*  CO2 20* 19*  GLUCOSE 89 90  BUN 14 13  CREATININE 0.51 0.49  CALCIUM 7.8* 6.7*   PT/INR No results for input(s): "LABPROT", "INR" in the last 72 hours. CMP     Component Value Date/Time   NA 137 12/27/2021 0459   K 3.0 (L) 12/27/2021 0459   CL 113 (H) 12/27/2021 0459   CO2 19 (L) 12/27/2021 0459   GLUCOSE 90 12/27/2021 0459   BUN 13 12/27/2021 0459   CREATININE 0.49 12/27/2021 0459   CALCIUM 6.7 (L) 12/27/2021 0459   PROT 6.8 12/22/2021 1342   ALBUMIN 2.2 (L) 12/22/2021 1342   AST 29 12/22/2021 1342   ALT 21 12/22/2021 1342   ALKPHOS 146 (H) 12/22/2021 1342   BILITOT 1.0 12/22/2021 1342   GFRNONAA >60 12/27/2021 0459   GFRAA >60 04/09/2017 1400   Lipase  No results found for: "LIPASE"  Studies/Results: [place] EKG SITE RITE  Result Date: 12/25/2021 If Site Rite image not attached, placement could not be confirmed due to current  cardiac rhythm.   Anti-infectives: Anti-infectives (From admission, onward)    Start     Dose/Rate Route Frequency Ordered Stop   12/24/21 1400  meropenem (MERREM) 1 g in sodium chloride 0.9 % 100 mL IVPB        1 g 200 mL/hr over 30 Minutes Intravenous Every 8 hours 12/24/21 1044     12/24/21 0000  levofloxacin (LEVAQUIN) IVPB 500 mg       Note to Pharmacy: To be given for radiology procedure, will be given in IR by IR nurse   500 mg 100 mL/hr over 60 Minutes Intravenous On call 12/23/21 1506 12/25/21 0753   12/23/21 1000  linezolid (ZYVOX) IVPB 600 mg        600 mg 300 mL/hr over 60 Minutes Intravenous Every 12 hours 12/23/21 0836     12/23/21 0400  meropenem (MERREM) 1 g in sodium chloride 0.9 % 100 mL IVPB  Status:  Discontinued        1 g 200 mL/hr over 30 Minutes Intravenous Every 12 hours 12/22/21 1645 12/24/21 1044   12/22/21 1800  piperacillin-tazobactam (ZOSYN) IVPB 3.375 g  Status:  Discontinued        3.375 g 12.5 mL/hr over 240 Minutes Intravenous Every 6 hours 12/22/21 1508  12/22/21 1515   12/22/21 1600  meropenem (MERREM) 1 g in sodium chloride 0.9 % 100 mL IVPB        1 g 200 mL/hr over 30 Minutes Intravenous  Once 12/22/21 1553 12/22/21 1646   12/22/21 1530  piperacillin-tazobactam (ZOSYN) IVPB 3.375 g  Status:  Discontinued        3.375 g 12.5 mL/hr over 240 Minutes Intravenous Every 8 hours 12/22/21 1515 12/22/21 1544        Assessment/Plan 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  Given patient is here with urosepsis, I do not think she would best served by getting general anesthesia and for a diverting colostomy at this time.  She does have anal necrosis but I do not know if this will necessarily completely heal ever, even after a diverting colostomy. Recommend pressure displacement as much as feasible.  I do think that she may benefit from a diverting colostomy at some point  but this can be done as an outpatient.  She can follow-up with our office after discharge.  Please call back with any questions or concerns.   LOS: 5 days    Jillyn Ledger , [facility] Surgery 12/27/2021, 9:22 AM Please see Amion for pager number during day hours 7:00am-4:30pm

## 2021-12-27 NOTE — Progress Notes (Signed)
PROGRESS NOTE                                                                                                                                                                                                             Patient Demographics:    [name], is a 76 y.o. female, DOB - 06/15/1945, UTM:[id number]  Outpatient Primary MD for the patient is Mal Amabile, Anthony Sar, MD    LOS - 5  Admit date - 12/22/2021    Chief Complaint  Patient presents with   Altered Mental Status       Brief Narrative (HPI from H&P)    76 y.o. female, who presented to the [facility] from [facility] with a chief complaint of AMS.  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  She was diagnosed with right-sided kidney stone, urosepsis, she was transferred to [facility], ICU for further care.  She was placed on IV pressors for a few days, stabilized, she was seen by urology, IR underwent right nephrostomy tube placement.  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  She was stabilized and transferred to hospitalist service on 12/26/2021 on day 4 of her hospital stay.   Subjective:   Patient in bed, appears comfortable, denies any headache, no fever, no chest pain or pressure, no shortness of breath , no abdominal pain. No new focal weakness.   Assessment  & Plan :   Septic shock caused by right obstructing ureteric stone with pyelonephritis.  She has been seen by IR, urology and PCCM, briefly required IV pressors, sepsis pathophysiology has resolved, culture data inconclusive, she is on empiric antibiotics and improving.  Underwent right-sided nephrostomy tube placement by IR on 12/24/2021.  Defer management of  this problem to IR and urology.  Continue antibiotics.  Will request ID to titrate her antibiotics regimen.  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  Nonspecific possible rectal wall adjacent intramural abscess.  General surgery following, antibiotics per ID, general surgery contemplating diverting colostomy to help with the decubitus ulcer heal.  Defer management of this  issue to the general surgery.  Baseline paraplegia due to spinal ischemic injury in the past, stage sacral decubitus ulcer present on admission.  Indwelling Foley catheter.  Foley catheter was changed on 12/25/2021, wound care and supportive care continue for her chronic issues.  AKI due to sepsis.  Improved.  Hypokalemia, hypophosphatemia, hypomagnesemia.  Replaced.  GERD.  On Pepcid.  Hypothyroidism.  Stable TSH continue Synthroid.  Anemia of chronic disease.  Monitor, some fall due to heme dilution and frequent lab draws.    DM type II.  On sliding scale.  Lab Results  Component Value Date   HGBA1C 6.0 (H) 11/15/2021   CBG (last 3)  Recent Labs    12/25/21 1109 12/25/21 1529 12/27/21 0736  GLUCAP 99 95 98    12/24/2021.  Right nephrostomy tube placed 12/25/2021.  Indwelling Foley catheter exchanged. 12/25/2021. L.Arm PICC   Discharge planning for the Right nephrostomy tube per IR: Please contact IR APP or on call IR MD prior to patient d/c to ensure appropriate follow up plans are in place. Typically patient will follow up with IR clinic 10-14 days post d/c for repeat imaging/possible drain injection. IR scheduler will contact patient with date/time of appointment. Patient will need to flush drain QD with 5 cc NS, record output QD, dressing changes every 2-3 days or earlier if soiled.        Condition - Extremely Guarded  Family Communication  : Daughter [name] [phone] on 12/26/2021 at 9:56  AM  Code Status : Full  Consults  : PCCM, urology, general surgery, urology, IR, ID, palliative care  PUD Prophylaxis :   Significant events and procedures  :     9/27 Presented to [facility], meropenem, PCCM consult CT abdomen/pelvis 9/28 >> cirrhotic changes without mass, normal gallbladder, severe right hydronephrosis with chronic right nephrolithiasis proximally and distally.  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  There is a 59m low-density area in the rectal wall along the lateral aspect that could represent a small intramural abscess.  Decubitus ulcer overlying the right ischial tuberosity extends almost to bone C. difficile toxin 9/28 >> negative Urine culture 9/27 >> multiple species, needs to be recollected Blood cultures 9/27 >>  12/24/2021.  Right nephrostomy tube placed 12/25/2021.  Indwelling Foley catheter exchanged. 12/25/2021. L.Arm PICC 12/26/2021.  Transferred to [facility]     Disposition Plan  :    Status is: Inpatient  DVT Prophylaxis  :    heparin injection 5,000 Units Start: 12/22/21 2200 SCDs Start: 12/22/21 1926  Lab Results  Component Value Date   PLT 252 12/27/2021    Diet :  Diet Order             Diet regular Room service appropriate? Yes; Fluid consistency: Thin  Diet effective now                    Inpatient Medications  Scheduled Meds:  aspirin  81 mg Oral Daily   atorvastatin  10 mg Oral Daily   buPROPion  300 mg Oral Daily   Chlorhexidine Gluconate Cloth  6 each Topical Daily   ezetimibe  10 mg Oral QHS   famotidine  20 mg Oral BID   gabapentin  300 mg Oral Q8H   heparin  5,000 Units Subcutaneous Q8H   levothyroxine  175 mcg Oral Q0600   midodrine  5 mg Oral TID  WC   mouth rinse  15 mL Mouth Rinse 4 times per day   potassium chloride  20 mEq Oral Once   sodium chloride flush  10-40 mL Intracatheter  Q12H   Continuous Infusions:  sodium chloride Stopped (12/22/21 2250)   linezolid (ZYVOX) IV 600 mg (12/26/21 2104)   meropenem (MERREM) IV 1 g (12/27/21 0502)   PRN Meds:.acetaminophen, docusate sodium, ipratropium-albuterol, lip balm, mouth rinse, polyethylene glycol  Time Spent in minutes  30   Lala Lund M.D on 12/27/2021 at 9:28 AM  To page go to [email or web address]   [facility] -  Office  [phone]  See all Orders from today for further details    Objective:   Vitals:   12/27/21 0310 12/27/21 0400 12/27/21 0500 12/27/21 0733  BP:  117/63 111/68   Pulse:  79 81   Resp:  (!) 21 20   Temp: 99 F (37.2 C)   99.8 F (37.7 C)  TempSrc: Oral   Oral  SpO2:  100% 100%   Weight:      Height:        Wt Readings from Last 3 Encounters:  12/26/21 119.1 kg  11/18/21 114 kg  06/04/20 111.6 kg     Intake/Output Summary (Last 24 hours) at 12/27/2021 0928 Last data filed at 12/27/2021 0600 Gross per 24 hour  Intake 129.41 ml  Output 1525 ml  Net -1395.59 ml     Physical Exam  Awake Alert, No new F.N deficits, Normal affect [place].AT,PERRAL Supple Neck, No JVD,   Symmetrical Chest wall movement, Good air movement bilaterally, CTAB RRR,No Gallops, Rubs or new Murmurs,  +ve B.Sounds, Abd Soft, No tenderness,    Bilateral paraplegia, both ankles with chronic decubitus ulcers present on admission under bandage, indwelling Foley catheter switched on 12/25/2021, right nephrostomy tube.    RN pressure injury documentation: Pressure Injury 11/15/21 Buttocks Right;Lower Deep Tissue Pressure Injury - Purple or maroon localized area of discolored intact skin or blood-filled blister due to damage of underlying soft tissue from pressure and/or shear. (Active)  11/15/21   Location: Buttocks  Location Orientation: Right;Lower  Staging: Deep Tissue Pressure Injury - Purple or maroon localized area of discolored intact skin or blood-filled blister due to damage of underlying  soft tissue from pressure and/or shear.  Wound Description (Comments):   Present on Admission: Yes  Dressing Type None 12/26/21 2000     Pressure Injury 12/22/21 Thigh Left;Posterior Unstageable - Full thickness tissue loss in which the base of the injury is covered by slough (yellow, tan, gray, green or brown) and/or eschar (tan, brown or black) in the wound bed. slough present (Active)  12/22/21 1915  Location: Thigh  Location Orientation: Left;Posterior  Staging: Unstageable - Full thickness tissue loss in which the base of the injury is covered by slough (yellow, tan, gray, green or brown) and/or eschar (tan, brown or black) in the wound bed.  Wound Description (Comments): slough present  Present on Admission: Yes  Dressing Type Foam - Lift dressing to assess site every shift 12/26/21 0800     Pressure Injury 12/22/21 Thigh Distal;Left;Posterior Unstageable - Full thickness tissue loss in which the base of the injury is covered by slough (yellow, tan, gray, green or brown) and/or eschar (tan, brown or black) in the wound bed. (Active)  12/22/21 1915  Location: Thigh  Location Orientation: Distal;Left;Posterior  Staging: Unstageable - Full thickness tissue loss in which the base of the injury is covered by slough (yellow, tan, gray,  green or brown) and/or eschar (tan, brown or black) in the wound bed.  Wound Description (Comments):   Present on Admission: Yes  Dressing Type Foam - Lift dressing to assess site every shift 12/26/21 0800     Pressure Injury 12/22/21 Ischial tuberosity Left Stage 4 - Full thickness tissue loss with exposed bone, tendon or muscle. deep tunneling wound (Active)  12/22/21 1915  Location: Ischial tuberosity  Location Orientation: Left  Staging: Stage 4 - Full thickness tissue loss with exposed bone, tendon or muscle.  Wound Description (Comments): deep tunneling wound  Present on Admission: Yes  Dressing Type Foam - Lift dressing to assess site every  shift;Normal saline moist dressing 12/26/21 0800     Data Review:    CBC Recent Labs  Lab 12/22/21 1342 12/22/21 2123 12/24/21 0244 12/25/21 0541 12/25/21 0910 12/25/21 1831 12/26/21 0213 12/27/21 0459  WBC 35.7*   < > 18.5* 9.5 9.1 10.2 8.1 8.0  HGB 7.1*   < > 7.2* 6.5* 6.6* 8.4* 7.6* 7.1*  HCT 23.8*   < > 23.7* 21.2* 20.4* 26.5* 23.9* 23.2*  PLT 470*   < > 444* 291 268 299 269 252  MCV 89.8   < > 86.8 85.8 85.4 86.0 86.3 87.2  MCH 26.8   < > 26.4 26.3 27.6 27.3 27.4 26.7  MCHC 29.8*   < > 30.4 30.7 32.4 31.7 31.8 30.6  RDW 17.2*   < > 17.1* 16.8* 16.9* 16.8* 16.8* 16.9*  LYMPHSABS 1.9  --  1.6  --   --   --   --   --   MONOABS 1.9*  --  0.9  --   --   --   --   --   EOSABS 0.1  --  0.1  --   --   --   --   --   BASOSABS 0.1  --  0.1  --   --   --   --   --    < > = values in this interval not displayed.    Electrolytes Recent Labs  Lab 12/22/21 1342 12/22/21 2214 12/23/21 0310 12/24/21 0244 12/25/21 0541 12/26/21 0213 12/27/21 0459  NA 127* 132* 133* 134* 132* 134* 137  K 5.9* 4.9 4.6 4.2 3.8 3.5 3.0*  CL 102 107 106 110 108 108 113*  CO2 12* 16* 12* 17* 19* 20* 19*  GLUCOSE 80 110* 136* 129* 84 89 90  BUN 83* 72* 64* 36* '20 14 13  '$ CREATININE 1.72* 1.67* 1.46* 0.73 0.54 0.51 0.49  CALCIUM 8.2* 8.1* 8.3* 8.2* 7.7* 7.8* 6.7*  AST 29  --   --   --   --   --   --   ALT 21  --   --   --   --   --   --   ALKPHOS 146*  --   --   --   --   --   --   BILITOT 1.0  --   --   --   --   --   --   ALBUMIN 2.2*  --   --   --   --   --   --   MG 2.8* 2.5* 2.4  --  1.7 1.8 1.7  PROCALCITON  --  0.84  --   --   --   --   --   LATICACIDVEN 1.1  --   --   --   --   --   --  INR  --   --   --  1.4*  --   --   --   TSH  --  3.775  --   --   --   --   --   AMMONIA 14  --   --   --   --   --   --   BNP 38.0  --   --   --   --   --   --    ID Labs Recent Labs  Lab 12/22/21 1342 12/22/21 2123 12/22/21 2214 12/23/21 0310 12/24/21 0244 12/25/21 0541 12/25/21 0910  12/25/21 1831 12/26/21 0213 12/27/21 0459  WBC 35.7*   < >  --  31.2* 18.5* 9.5 9.1 10.2 8.1 8.0  PLT 470*   < >  --  442* 444* 291 268 299 269 252  PROCALCITON  --   --  0.84  --   --   --   --   --   --   --   LATICACIDVEN 1.1  --   --   --   --   --   --   --   --   --   CREATININE 1.72*  --  1.67* 1.46* 0.73 0.54  --   --  0.51 0.49   < > = values in this interval not displayed.    Radiology Reports [place] EKG SITE RITE  Result Date: 12/25/2021 If Site Rite image not attached, placement could not be confirmed due to current cardiac rhythm.  IR NEPHROSTOMY PLACEMENT RIGHT  Result Date: 12/24/2021 INDICATION: 76 year old female referred for percutaneous nephrostomy. Current sepsis and a history of paraplegia EXAM: IMAGE GUIDED PERCUTANEOUS NEPHROSTOMY COMPARISON:  None MEDICATIONS: 500 mg IV Levaquin ANESTHESIA/SEDATION: Moderate (conscious) sedation was employed during this procedure. A total of Versed 0.5 mg and Fentanyl 25 mcg was administered intravenously by the radiology nurse. Total intra-service moderate Sedation Time: 13 minutes. The patient's level of consciousness and vital signs were monitored continuously by radiology nursing throughout the procedure under my direct supervision. CONTRAST:  15 cc-administered into the collecting system(s) FLUOROSCOPY: Radiation Exposure Index (as provided by the fluoroscopic device): 2 mGy Kerma COMPLICATIONS: None PROCEDURE: Informed written consent was obtained from the patient and the patient's family after a thorough discussion of the procedural risks, benefits and alternatives. All questions were addressed. Maximal Sterile Barrier Technique was utilized including caps, mask, sterile gowns, sterile gloves, sterile drape, hand hygiene and skin antiseptic. A timeout was performed prior to the initiation of the procedure. Patient positioned prone position on the fluoroscopy table. Ultrasound survey of the right flank was performed with images stored and  sent to PACs. The patient was then prepped and draped in the usual sterile fashion. 1% lidocaine was used to anesthetize the skin and subcutaneous tissues for local anesthesia. A Chiba needle was then used to access a posterior inferior calyx with ultrasound guidance. With spontaneous urine returned through the needle, passage of an 018 micro wire into the collecting system was performed under fluoroscopy. A small incision was made with an 11 blade scalpel, and the needle was removed from the wire. An Accustick system was then advanced over the wire into the collecting system under fluoroscopy. The metal stiffener and inner dilator were removed. Contrast was injected confirming location in the collecting system. Bentson wire was passed into the collecting system and the sheath removed. Ten French dilation of the soft tissues was performed. Using modified Seldinger technique, a 10 [place]  pigtail catheter drain was placed over the Bentson wire. Wire and inner stiffener removed, and the pigtail was formed in the collecting system. Approximately 110 cc of frank pyonephrosis was aspirated. Sample was sent to the lab. Small amount of contrast confirmed position of the catheter. Patient tolerated the procedure well and remained hemodynamically stable throughout. No complications were encountered and no significant blood loss encountered IMPRESSION: Status post image guided right-sided percutaneous nephrostomy, for decompression of frank pyonephrosis. Signed, Dulcy Fanny. Nadene Rubins, RPVI [facility] Electronically Signed   By: Corrie Mckusick D.O.   On: 12/24/2021 09:31   CT ABDOMEN PELVIS W CONTRAST  Result Date: 12/23/2021 CLINICAL DATA:  Sacral wound leaking stool. EXAM: CT ABDOMEN AND PELVIS WITH CONTRAST TECHNIQUE: Multidetector CT imaging of the abdomen and pelvis was performed using the standard protocol following bolus administration of intravenous  contrast. RADIATION DOSE REDUCTION: This exam was performed according to the departmental dose-optimization program which includes automated exposure control, adjustment of the mA and/or kV according to patient size and/or use of iterative reconstruction technique. CONTRAST:  16m OMNIPAQUE IOHEXOL 350 MG/ML SOLN COMPARISON:  05/21/2020 FINDINGS: Lower chest: Trace bilateral pleural effusions. Bibasilar atelectasis. Hepatobiliary: Cirrhosis without focal hepatic mass. No gallstones, gallbladder wall thickening, or biliary dilatation. Pancreas: Unremarkable. No pancreatic ductal dilatation or surrounding inflammatory changes. Spleen: Normal in size without focal abnormality. Adrenals/Urinary Tract: Adrenal glands are unremarkable. Stable bilateral renal cysts for which no further evaluation is recommended. Severe right hydronephrosis. Right nephrolithiasis with the largest in the renal pelvis measuring 3.6 cm. 4 mm right mid ureteral calculus. 7 mm right distal ureteral calculus. Decompressed bladder. Foley catheter in the bladder. Stomach/Bowel: No bowel dilatation to suggest bowel obstruction. Bowel wall thickening of the rectosigmoid colon consistent with proctocolitis. 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 16 mm low-density area in the rectal wall along the left lateral aspect likely reflecting a small intramural abscess. Vascular/Lymphatic: Normal caliber abdominal aorta with mild atherosclerosis. No lymphadenopathy. Reproductive: Status post hysterectomy. No adnexal masses. Other: No abdominal wall hernia or abnormality. No abdominopelvic ascites. Sacral decubitus ulcer. Decubitus ulcer overlying the right ischial tuberosity. Generalized osteopenia. Mild osteoarthritis of bilateral SI joints. Mild osteoarthritis of the right hip. Musculoskeletal: No acute osseous abnormality. No aggressive osseous lesion. Diffuse thoracolumbar spondylosis. 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 IMPRESSION: 1. Bowel wall thickening of the rectosigmoid colon consistent with proctocolitis. 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 16 mm low-density area in the rectal wall along the left lateral aspect likely reflecting a small intramural abscess. 2. Severe right hydronephrosis. Right nephrolithiasis with the largest in the renal pelvis measuring 3.6 cm. 4 mm right mid ureteral calculus. 7 mm right distal ureteral calculus. 3. Decubitus ulcer overlying the right ischial tuberosity. Electronically Signed   By: HKathreen DevoidM.D.   On: 12/23/2021 14:13

## 2021-12-27 NOTE — Consult Note (Signed)
[facility]    Date of Admission:  12/22/2021     Total days of antibiotics 5  Linezolid 9/28  Merrem 9/28               Reason for Consult: Pyelonephritis with obstructing stone    Referring Provider: Candiss Norse Primary Care Provider: Hilbert Corrigan, MD    Assessment: [name] is a 76 y.o. female with T2DM, COPD/Asthma, paraplegic with chronic sacral wound here with AMS and leukocytosis; sepsis syndrome met with hypotension refractory to IVF and requiring vasopressors / ICU care. found to have severe hydronephrosis with 82m mid ureteral stone, 7 mm right distal stone and large 3.6 cm right renal stone, now s/p percutaneous nephrostomy tube. Urine cultures from this sample growing Morganella morganii, proteus mirabilis (susceptibilities pending), Enterococcus faecalis. She has improved now on 4 days of linezolid + merrem. Will consolidate to Imipenem (better enterococcus coverage) and stop linezolid. Will follow proteus susceptibilities to see if we can use pip tazo. Would plan for 10 more days of treatment via IV (morganella is FQ resistant and prefer to not use that with cdiff history).  She was not bacteremic at presentation.   Awaiting for susceptibilities of proteus mirabilis. Hopefully tomorrow so we can construct a regimen for treatment and going forward GU procedure prophylaxis recommendations. Hopeful to get her off carbapenem (was chosen d/t allergy history).   Follow urology plan for large kidney stone - [facility]. SJoaquim Laihas been present since early 2022 based on previous imaging -  would require percutaneous nephrolithotomy.   History of c dificil colitis - no symptoms at present.   D/W Surgery team regarding chronic sacral wound - no increased signs of soft tissue infection. No surgical plans. Would continue wound care, nutrition and off loading/ Does not appear infected.     Plan: Antibiotics Recommended: Imipenem IV  Follow proteus susceptibilities  Final recs hopefully tomorrow.  CDiff profile negative - follow for sx.     Principal Problem:   Septic shock (HCC) Active Problems:   Hyperkalemia    aspirin  81 mg Oral Daily   atorvastatin  10 mg Oral Daily   buPROPion  300 mg Oral Daily   Chlorhexidine Gluconate Cloth  6 each Topical Daily   ezetimibe  10 mg Oral QHS   famotidine  20 mg Oral BID   gabapentin  300 mg Oral Q8H   heparin  5,000 Units Subcutaneous Q8H   levothyroxine  175 mcg Oral Q0600   midodrine  5 mg Oral TID WC   mouth rinse  15 mL Mouth Rinse 4 times per day   potassium chloride  40 mEq Oral Once   sodium chloride flush  10-40 mL Intracatheter Q12H    HPI: Sandra SHENOYis a 76y.o. 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 H/O c diff infection in 2016  Hospitalized 8/21 - 8/25 for gram negative sepsis r/t UTI [facility]  [facility]. She does not recall much around her admission but does note she feels essentially normal now. No complaints at present.    Review of Systems: ROS -  slight headache but otherwise not contributory or does not remember.     Past Medical History:  Diagnosis Date   Bipolar 1 disorder ([place])    COPD with asthma ([place])    Diabetes mellitus without complication ([place])    GERD (gastroesophageal reflux disease)    Hypertension    Obesity    Paraplegic spinal paralysis ([place])    Pyelonephritis    Thyroid disease     Social History   Tobacco Use   Smoking status: Former   Smokeless tobacco: Never  Substance Use Topics   Alcohol use: Not Currently    Alcohol/week: 0.0 standard drinks of alcohol    Comment:  "weekend drinker, Saturday night drunk" years ago   Drug use: No    Family History  Adopted: Yes  Problem Relation Age of Onset   Heart disease Mother    Allergies  Allergen Reactions   Invanz [Ertapenem] Other (See Comments)    Patient states allergy but is not certain    Propranolol Hcl    Rocephin [Ceftriaxone] Hives   Vancomycin Hives    OBJECTIVE: Blood pressure 106/60, pulse 78, temperature 97.8 F (36.6 C), temperature source Oral, resp. rate (!) 21, height 5' 7" (1.702 m), weight 119.1 kg, SpO2 100 %.  Physical Exam Constitutional:      General: She is not in acute distress.    Appearance: Normal appearance. She is obese. She is ill-appearing.  HENT:     Mouth/Throat:     Mouth: Mucous membranes are moist.     Pharynx: Oropharynx is clear.  Cardiovascular:     Rate and Rhythm: Normal rate and regular rhythm.  Pulmonary:     Effort: Pulmonary effort is normal.  Abdominal:     General: Bowel sounds are normal.  Musculoskeletal:        General: Normal range of motion.  Skin:    General: Skin is warm.     Coloration: Skin is pale.  Neurological:     Mental Status: She is oriented to person, place, and time.     Lab Results Lab Results  Component Value Date   WBC 8.0 12/27/2021   HGB 7.1 (L) 12/27/2021   HCT 23.2 (L) 12/27/2021   MCV 87.2 12/27/2021   PLT 252 12/27/2021    Lab Results  Component Value Date   CREATININE 0.49 12/27/2021   BUN 13 12/27/2021   NA 137 12/27/2021   K 3.0 (L) 12/27/2021   CL 113 (H) 12/27/2021   CO2 19 (L) 12/27/2021    Lab Results  Component Value Date   ALT 21 12/22/2021   AST 29 12/22/2021   ALKPHOS 146 (H) 12/22/2021   BILITOT 1.0 12/22/2021     Microbiology: Recent Results (from the past 240 hour(s))  SARS Coronavirus 2 by RT PCR (hospital order, performed in [place] hospital lab) *cepheid single result test* Anterior Nasal Swab     Status: None   Collection Time: 12/22/21  1:40 PM   Specimen: Anterior  Nasal Swab  Result Value Ref Range Status   SARS Coronavirus 2 by RT PCR NEGATIVE NEGATIVE Final    Comment: (NOTE) SARS-CoV-2 target nucleic acids are NOT DETECTED.  The SARS-CoV-2 RNA is generally detectable in upper and lower respiratory specimens during the acute phase of infection. The lowest concentration of SARS-CoV-2 viral copies this assay can detect is 250 copies / mL. A negative result does not preclude SARS-CoV-2 infection and should not be used as the sole basis  for treatment or other patient management decisions.  A negative result may occur with improper specimen collection / handling, submission of specimen other than nasopharyngeal swab, presence of viral mutation(s) within the areas targeted by this assay, and inadequate number of viral copies (<250 copies / mL). A negative result must be combined with clinical observations, patient history, and epidemiological information.  Fact Sheet for Patients:   https://www.patel.info/  Fact Sheet for Healthcare Providers: https://hall.com/  This test is not yet approved or  cleared by the [place] FDA and has been authorized for detection and/or diagnosis of SARS-CoV-2 by FDA under an Emergency Use Authorization (EUA).  This EUA will remain in effect (meaning this test can be used) for the duration of the COVID-19 declaration under Section 564(b)(1) of the Act, 21 U.S.C. section 360bbb-3(b)(1), unless the authorization is terminated or revoked sooner.  Performed at [facility], [address] [place], [place] [address]   Blood culture (routine x 2)     Status: None   Collection Time: 12/22/21  2:49 PM   Specimen: Right Antecubital; Blood  Result Value Ref Range Status   Specimen Description RIGHT ANTECUBITAL  Final   Special Requests   Final    BOTTLES DRAWN AEROBIC AND ANAEROBIC Blood Culture results may not be optimal due to an inadequate volume of blood received in  culture bottles   Culture   Final    NO GROWTH 5 DAYS Performed at [facility], [address] [place], [place] [address]    Report Status 12/27/2021 FINAL  Final  Blood culture (routine x 2)     Status: None   Collection Time: 12/22/21  2:49 PM   Specimen: BLOOD RIGHT WRIST  Result Value Ref Range Status   Specimen Description BLOOD RIGHT WRIST  Final   Special Requests   Final    BOTTLES DRAWN AEROBIC AND ANAEROBIC Blood Culture adequate volume   Culture   Final    NO GROWTH 5 DAYS Performed at [facility], [address], [place], [place] [address]    Report Status 12/27/2021 FINAL  Final  Urine Culture     Status: Abnormal   Collection Time: 12/22/21  3:40 PM   Specimen: Urine, Random  Result Value Ref Range Status   Specimen Description   Final    URINE, RANDOM Performed at [facility], [address], [place], [place] [address]    Special Requests   Final    NONE Performed at [facility], [address] [place], [place] [address]    Culture MULTIPLE SPECIES PRESENT, SUGGEST RECOLLECTION (A)  Final   Report Status 12/23/2021 FINAL  Final  MRSA Next Gen by PCR, Nasal     Status: None   Collection Time: 12/22/21  7:43 PM   Specimen: Nasal Mucosa; Nasal Swab  Result Value Ref Range Status   MRSA by PCR Next Gen NOT DETECTED NOT DETECTED Final    Comment: (NOTE) The GeneXpert MRSA Assay (FDA approved for NASAL specimens only), is one component of a comprehensive MRSA colonization surveillance program. It is not intended to diagnose MRSA infection nor to guide or monitor treatment for MRSA infections. Test performance is not FDA approved in patients less than 22 years old. Performed at [facility], [place] [address], [place],  [address]   C Difficile Quick Screen w PCR reflex     Status: None   Collection Time: 12/23/21  7:26 PM   Specimen: STOOL  Result Value Ref Range  Status   C Diff antigen NEGATIVE NEGATIVE Final   C Diff toxin NEGATIVE NEGATIVE  Final   C Diff interpretation No C. difficile detected.  Final    Comment: Performed at [facility], [place] [address] [place], [place] [address]  Aerobic/Anaerobic Culture w Gram Stain (surgical/deep wound)     Status: None (Preliminary result)   Collection Time: 12/24/21  9:01 AM   Specimen: Other Source; Urine  Result Value Ref Range Status   Specimen Description OTHER  Final   Special Requests KIDNEY RIGHT  Final   Gram Stain   Final    ABUNDANT WBC PRESENT, PREDOMINANTLY PMN FEW GRAM POSITIVE COCCI IN CHAINS IN PAIRS FEW GRAM NEGATIVE RODS    Culture   Final    ABUNDANT MORGANELLA MORGANII ABUNDANT PROTEUS MIRABILIS ABUNDANT ENTEROCOCCUS FAECALIS REPEATING SUSCEPTIBILITIES. CULTURE REINCUBATED FOR BETTER GROWTH Performed at [facility], [place] [address] [place], [place] [address]    Report Status PENDING  Incomplete   Organism ID, Bacteria MORGANELLA MORGANII  Final      Susceptibility   Morganella morganii - MIC*    AMPICILLIN >=32 RESISTANT Resistant     CEFAZOLIN RESISTANT Resistant     CEFTAZIDIME <=1 SENSITIVE Sensitive     CIPROFLOXACIN >=4 RESISTANT Resistant     GENTAMICIN <=1 SENSITIVE Sensitive     IMIPENEM 1 SENSITIVE Sensitive     TRIMETH/SULFA >=320 RESISTANT Resistant     AMPICILLIN/SULBACTAM >=32 RESISTANT Resistant     PIP/TAZO <=4 SENSITIVE Sensitive     * ABUNDANT MORGANELLA MORGANII    Janene Madeira, MSN, NP-C [place] Infectious Disease [place] Pager: [phone]   12/27/2021 1:20 PM

## 2021-12-27 NOTE — Progress Notes (Signed)
Pt transferred to 5M09 with RN and NT. Foley emptied prior to transfer. Pt meal and belongings taken to 5M09 with patient. Daughter [name] made aware of transfer.

## 2021-12-28 ENCOUNTER — Telehealth: Payer: Self-pay

## 2021-12-28 DIAGNOSIS — A419 Sepsis, unspecified organism: Secondary | ICD-10-CM | POA: Diagnosis not present

## 2021-12-28 DIAGNOSIS — R6521 Severe sepsis with septic shock: Secondary | ICD-10-CM | POA: Diagnosis not present

## 2021-12-28 LAB — CBC
HCT: 26.6 % — ABNORMAL LOW (ref 36.0–46.0)
Hemoglobin: 8.3 g/dL — ABNORMAL LOW (ref 12.0–15.0)
MCH: 27.5 pg (ref 26.0–34.0)
MCHC: 31.2 g/dL (ref 30.0–36.0)
MCV: 88.1 fL (ref 80.0–100.0)
Platelets: 265 10*3/uL (ref 150–400)
RBC: 3.02 MIL/uL — ABNORMAL LOW (ref 3.87–5.11)
RDW: 17.2 % — ABNORMAL HIGH (ref 11.5–15.5)
WBC: 11 10*3/uL — ABNORMAL HIGH (ref 4.0–10.5)
nRBC: 0 % (ref 0.0–0.2)

## 2021-12-28 LAB — BASIC METABOLIC PANEL
Anion gap: 8 (ref 5–15)
BUN: 11 mg/dL (ref 8–23)
CO2: 19 mmol/L — ABNORMAL LOW (ref 22–32)
Calcium: 8.1 mg/dL — ABNORMAL LOW (ref 8.9–10.3)
Chloride: 108 mmol/L (ref 98–111)
Creatinine, Ser: 0.53 mg/dL (ref 0.44–1.00)
GFR, Estimated: >60 mL/min (ref >60)
Glucose, Bld: 77 mg/dL (ref 70–99)
Potassium: 4.6 mmol/L (ref 3.5–5.1)
Sodium: 135 mmol/L (ref 135–145)

## 2021-12-28 LAB — PHOSPHORUS: Phosphorus: 2.5 mg/dL (ref 2.5–4.6)

## 2021-12-28 LAB — MAGNESIUM: Magnesium: 1.6 mg/dL — ABNORMAL LOW (ref 1.7–2.4)

## 2021-12-28 MED ORDER — MAGNESIUM SULFATE 4 GM/100ML IV SOLN
4.0000 g | Freq: Once | INTRAVENOUS | Status: AC
  Administered 2021-12-28: 4 g via INTRAVENOUS
  Filled 2021-12-28: qty 100

## 2021-12-28 MED ORDER — PIPERACILLIN-TAZOBACTAM 3.375 G IVPB
3.3750 g | Freq: Three times a day (TID) | INTRAVENOUS | Status: DC
  Administered 2021-12-28 – 2021-12-29 (×5): 3.375 g via INTRAVENOUS
  Filled 2021-12-28 (×5): qty 50

## 2021-12-28 NOTE — TOC Progression Note (Addendum)
Transition of Care [facility]) - Initial/Assessment Note    Patient Details  Name: [name] MRN: [id number] Date of Birth: [birth year]  Transition of Care [facility]) CM/SW Contact:    Milinda Antis, [place] Phone Number: 12/28/2021, 12:08 PM  Clinical Narrative:                 [facility] about what will be needed for the patient to return.  There was no answer.  CSW left a secure VM with the admissions director requesting a returned call.   12:28-  CSW received a returned call from [place] in admissions at [facility]  The patient can return when medically ready.   Patient Goals and CMS Choice        Expected Discharge Plan and Services                                                Prior Living Arrangements/Services                       Activities of Daily Living      Permission Sought/Granted                  Emotional Assessment              Admission diagnosis:  Hyperkalemia [E87.5] Hyponatremia [E87.1] Septic shock ([place]) [A41.9, R65.21] Altered mental status, unspecified altered mental status type [R41.82] Sepsis with acute renal failure and septic shock, due to unspecified organism, unspecified acute renal failure type ([place]) [A41.9, R65.21, N17.9] Patient Active Problem List   Diagnosis Date Noted   Hydronephrosis 12/27/2021   Septic shock ([place]) 12/22/2021   Hyperkalemia    Acute metabolic encephalopathy 40/98/1191   Pressure injury of skin 11/15/2021   Neurogenic bladder 02/23/2021   Urinary incontinence 02/23/2021   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   Constipation 06/05/2020   Hypokalemia 04/04/2016   Hyperglycemia 04/04/2016   Cellulitis 04/03/2016   Sepsis ([place]) 09/08/2014   Chronic paraplegia ([place]) 09/08/2014   Swelling of right lower extremity 07/21/2014   Discitis of lumbar region    Abscess in epidural space of lumbar spine    Blood poisoning    Osteomyelitis ([place])    Discitis of  thoracolumbar region    Anxiety state    Chronic pain syndrome    Sepsis due to Candida species ([place])    Spondylarthrosis    AKI (acute kidney injury) ([place])    Other specified hypothyroidism    Essential hypertension    Paraplegia ([place])    Enteritis due to Clostridium difficile 05/08/2014   UTI (urinary tract infection) 05/08/2014   Chronic indwelling Foley catheter 05/08/2014   Hypotension    Decubitus ulcer of sacral region, unstageable ([place]) 03/28/2014   Hypothyroid 03/28/2014   Depression 03/28/2014   ARF (acute renal failure) ([place]) 03/28/2014   Acute respiratory failure with hypoxia ([place]) 03/27/2014   Pyelonephritis 10/22/2013   Leukocytosis 10/22/2013   Hyponatremia 10/22/2013   Encephalopathy acute 10/22/2013   Hypothyroidism 10/22/2013   Hypertension    Bipolar 1 disorder ([place])    Thyroid disease    Diabetes mellitus without complication ([place])    GERD (gastroesophageal reflux disease)    SHOULDER PAIN 11/19/2007   HAND, ARTHRITIS, DEGEN./OSTEO 09/03/2007   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

## 2021-12-28 NOTE — Progress Notes (Signed)
[facility] for Infectious Disease  Date of Admission:  12/22/2021      Total days of antibiotics 6  Zosyn          ASSESSMENT: [name] is a 76 y.o. female admitted with obstructive renal calculi with severe hydronephrosis now s/p PNT placement. Urine cultures with polymicrobial growth (enterococcus faecalis, morganella morganii, proteus mirabilis and bacteroides fragilis). Proteus susceptibilities match morganella and will be covered with IV zosyn >> will change to this and proceed with final recs for 10d total treatment from PNT placement. She has PICC line in place.  For antibiotic prophylaxis would not rely on FQ given organisms are resistant to this - cefepime would be a better choice.   No changes with diarrhea - infrequent and seems antibiotic associated. No abd tenderness.   We will sign off for now but happy to have call back about this patient should her condition change. FU with urology planned in 2-3 weeks at [facility] clinic.     PLAN: OPAT as defined below  PICC to be D/C'd at [facility] after antibiotics are complete.  Pending D/C back to [facility] in process. OK from ID perspective.    OPAT ORDERS:  Diagnosis: obstructing hydronephrosis d/t renal calclui  Culture Result: enterococcus faecalis, morganella morganii, proteus mirabilis and bacteroides fragilis  Allergies  Allergen Reactions   Invanz [Ertapenem] Other (See Comments)    Patient states allergy but is not certain    Propranolol Hcl    Rocephin [Ceftriaxone] Hives   Vancomycin Hives     Discharge antibiotics to be given via PICC line:  Per pharmacy protocol ZOSYN  Duration: 10 Days total from PNT placement    End Date: 01/03/2022  [facility] Care Per Protocol with Biop[employer] Use: Home health RN for IV administration and teaching, line care and labs.    Labs weekly while on IV antibiotics: _x_ CBC with differential __ BMP **TWICE WEEKLY ON VANCOMYCIN  _x_ CMP __ CRP __  ESR __ Vancomycin trough TWICE WEEKLY __ CK  _x_ Please pull PIC at completion of IV antibiotics __ Please leave PIC in place until doctor has seen patient or been notified  Fax weekly labs to [phone]  Clinic Follow Up Appt: FU with Urology team in [place]     Principal Problem:   Septic shock ([place]) Active Problems:   Pyelonephritis   Chronic indwelling Foley catheter   Staghorn calculus   Hyperkalemia   Hydronephrosis    aspirin  81 mg Oral Daily   atorvastatin  10 mg Oral Daily   buPROPion  300 mg Oral Daily   Chlorhexidine Gluconate Cloth  6 each Topical Daily   ezetimibe  10 mg Oral QHS   famotidine  20 mg Oral BID   gabapentin  300 mg Oral Q8H   heparin  5,000 Units Subcutaneous Q8H   levothyroxine  175 mcg Oral Q0600   midodrine  5 mg Oral TID WC   mouth rinse  15 mL Mouth Rinse 4 times per day   sodium chloride flush  10-40 mL Intracatheter Q12H    SUBJECTIVE: Doing well today. No complaints. She has not had any trouble with her antibiotics. Pain is well controlled.    Review of Systems: Review of Systems  All other systems reviewed and are negative.   Allergies  Allergen Reactions   Invanz [Ertapenem] Other (See Comments)    Patient states allergy but is not certain  Propranolol Hcl    Rocephin [Ceftriaxone] Hives   Vancomycin Hives    OBJECTIVE: Vitals:   12/27/21 2058 12/28/21 0500 12/28/21 0600 12/28/21 0804  BP: 122/65  134/74 118/66  Pulse: 79  90 89  Resp: 18  18   Temp: 98.5 F (36.9 C)  98.2 F (36.8 C) 98.9 F (37.2 C)  TempSrc: Oral  Oral Oral  SpO2: 100%  99% 99%  Weight:  118 kg    Height:       Body mass index is 40.74 kg/m.  Physical Exam Constitutional:      Appearance: She is well-developed.  HENT:     Mouth/Throat:     Mouth: No oral lesions.     Dentition: Normal dentition. No dental abscesses.     Pharynx: No oropharyngeal exudate.  Cardiovascular:     Rate and Rhythm: Normal rate and regular  rhythm.     Heart sounds: Normal heart sounds.  Pulmonary:     Effort: Pulmonary effort is normal.     Breath sounds: Normal breath sounds.  Abdominal:     General: There is no distension.     Palpations: Abdomen is soft.     Tenderness: There is no abdominal tenderness.  Genitourinary:    Comments: Rt PNT draining clear yellow urine.  Lymphadenopathy:     Cervical: No cervical adenopathy.  Skin:    General: Skin is warm and dry.     Findings: No rash.  Neurological:     Mental Status: She is alert and oriented to person, place, and time.  Psychiatric:        Judgment: Judgment normal.     Lab Results Lab Results  Component Value Date   WBC 11.0 (H) 12/28/2021   HGB 8.3 (L) 12/28/2021   HCT 26.6 (L) 12/28/2021   MCV 88.1 12/28/2021   PLT 265 12/28/2021    Lab Results  Component Value Date   CREATININE 0.53 12/28/2021   BUN 11 12/28/2021   NA 135 12/28/2021   K 4.6 12/28/2021   CL 108 12/28/2021   CO2 19 (L) 12/28/2021    Lab Results  Component Value Date   ALT 21 12/22/2021   AST 29 12/22/2021   ALKPHOS 146 (H) 12/22/2021   BILITOT 1.0 12/22/2021     Microbiology: Recent Results (from the past 240 hour(s))  SARS Coronavirus 2 by RT PCR (hospital order, performed in [place] hospital lab) *cepheid single result test* Anterior Nasal Swab     Status: None   Collection Time: 12/22/21  1:40 PM   Specimen: Anterior Nasal Swab  Result Value Ref Range Status   SARS Coronavirus 2 by RT PCR NEGATIVE NEGATIVE Final    Comment: (NOTE) SARS-CoV-2 target nucleic acids are NOT DETECTED.  The SARS-CoV-2 RNA is generally detectable in upper and lower respiratory specimens during the acute phase of infection. The lowest concentration of SARS-CoV-2 viral copies this assay can detect is 250 copies / mL. A negative result does not preclude SARS-CoV-2 infection and should not be used as the sole basis for treatment or other patient management decisions.  A negative result  may occur with improper specimen collection / handling, submission of specimen other than nasopharyngeal swab, presence of viral mutation(s) within the areas targeted by this assay, and inadequate number of viral copies (<250 copies / mL). A negative result must be combined with clinical observations, patient history, and epidemiological information.  Fact Sheet for Patients:   https://www.patel.info/  Fact Sheet  for Healthcare Providers: https://hall.com/  This test is not yet approved or  cleared by the [place] and has been authorized for detection and/or diagnosis of SARS-CoV-2 by FDA under an Emergency Use Authorization (EUA).  This EUA will remain in effect (meaning this test can be used) for the duration of the COVID-19 declaration under Section 564(b)(1) of the Act, 21 U.S.C. section 360bbb-3(b)(1), unless the authorization is terminated or revoked sooner.  Performed at [facility], [address] [place], [place] [address]   Blood culture (routine x 2)     Status: None   Collection Time: 12/22/21  2:49 PM   Specimen: Right Antecubital; Blood  Result Value Ref Range Status   Specimen Description RIGHT ANTECUBITAL  Final   Special Requests   Final    BOTTLES DRAWN AEROBIC AND ANAEROBIC Blood Culture results may not be optimal due to an inadequate volume of blood received in culture bottles   Culture   Final    NO GROWTH 5 DAYS Performed at [facility], [address], [place], [place] [address]    Report Status 12/27/2021 FINAL  Final  Blood culture (routine x 2)     Status: None   Collection Time: 12/22/21  2:49 PM   Specimen: BLOOD RIGHT WRIST  Result Value Ref Range Status   Specimen Description BLOOD RIGHT WRIST  Final   Special Requests   Final    BOTTLES DRAWN AEROBIC AND ANAEROBIC Blood Culture adequate volume   Culture   Final    NO GROWTH 5 DAYS Performed at [facility], [address] [place],  [place] [address]    Report Status 12/27/2021 FINAL  Final  Urine Culture     Status: Abnormal   Collection Time: 12/22/21  3:40 PM   Specimen: Urine, Random  Result Value Ref Range Status   Specimen Description   Final    URINE, RANDOM Performed at [facility], [address] [place], [place] [address]    Special Requests   Final    NONE Performed at [facility], [address] [place], [place] [address]    Culture MULTIPLE SPECIES PRESENT, SUGGEST RECOLLECTION (A)  Final   Report Status 12/23/2021 FINAL  Final  MRSA Next Gen by PCR, Nasal     Status: None   Collection Time: 12/22/21  7:43 PM   Specimen: Nasal Mucosa; Nasal Swab  Result Value Ref Range Status   MRSA by PCR Next Gen NOT DETECTED NOT DETECTED Final    Comment: (NOTE) The GeneXpert MRSA Assay (FDA approved for NASAL specimens only), is one component of a comprehensive MRSA colonization surveillance program. It is not intended to diagnose MRSA infection nor to guide or monitor treatment for MRSA infections. Test performance is not FDA approved in patients less than 42 years old. Performed at [facility], [place] [address], [place], [place] [address]   C Difficile Quick Screen w PCR reflex     Status: None   Collection Time: 12/23/21  7:26 PM   Specimen: STOOL  Result Value Ref Range Status   C Diff antigen NEGATIVE NEGATIVE Final   C Diff toxin NEGATIVE NEGATIVE Final   C Diff interpretation No C. difficile detected.  Final    Comment: Performed at [facility], [place] [address], [place],  [address]  Aerobic/Anaerobic Culture w Gram Stain (surgical/deep wound)     Status: None (Preliminary result)   Collection Time: 12/24/21  9:01 AM   Specimen:  Other Source; Urine  Result Value Ref Range Status   Specimen Description OTHER  Final   Special Requests KIDNEY RIGHT  Final   Gram Stain   Final    ABUNDANT WBC PRESENT, PREDOMINANTLY PMN FEW GRAM POSITIVE COCCI IN CHAINS IN PAIRS FEW GRAM NEGATIVE  RODS    Culture   Final    ABUNDANT MORGANELLA MORGANII ABUNDANT PROTEUS MIRABILIS ABUNDANT ENTEROCOCCUS FAECALIS Sent to [facility] for further susceptibility testing. ABUNDANT BACTEROIDES FRAGILIS BETA LACTAMASE NEGATIVE Performed at [facility], [place] [address], [place], [place] [address]    Report Status PENDING  Incomplete   Organism ID, Bacteria MORGANELLA MORGANII  Final   Organism ID, Bacteria PROTEUS MIRABILIS  Final      Susceptibility   Morganella morganii - MIC*    AMPICILLIN >=32 RESISTANT Resistant     CEFAZOLIN RESISTANT Resistant     CEFTAZIDIME <=1 SENSITIVE Sensitive     CIPROFLOXACIN >=4 RESISTANT Resistant     GENTAMICIN <=1 SENSITIVE Sensitive     IMIPENEM 1 SENSITIVE Sensitive     TRIMETH/SULFA >=320 RESISTANT Resistant     AMPICILLIN/SULBACTAM >=32 RESISTANT Resistant     PIP/TAZO <=4 SENSITIVE Sensitive     * ABUNDANT MORGANELLA MORGANII   Proteus mirabilis - MIC*    AMPICILLIN >=32 RESISTANT Resistant     CEFAZOLIN >=64 RESISTANT Resistant     CEFEPIME <=0.12 SENSITIVE Sensitive     CEFTAZIDIME 2 SENSITIVE Sensitive     CEFTRIAXONE 1 SENSITIVE Sensitive     CIPROFLOXACIN >=4 RESISTANT Resistant     GENTAMICIN <=1 SENSITIVE Sensitive     IMIPENEM 2 SENSITIVE Sensitive     TRIMETH/SULFA >=320 RESISTANT Resistant     AMPICILLIN/SULBACTAM >=32 RESISTANT Resistant     PIP/TAZO <=4 SENSITIVE Sensitive     * ABUNDANT PROTEUS MIRABILIS    Janene Madeira, MSN, NP-C [facility] [email or web address] Pager: [phone] Office: [phone] [facility] Main Line: [facility] Communication Welcome

## 2021-12-28 NOTE — Progress Notes (Signed)
Referring Physician(s): Ronnie Derby  Supervising Physician: Mir, Sharen Heck  Patient Status:  [facility] - In-pt  Chief Complaint:  Rt PCN placed secondary hydronephrosis; obstruction  Subjective:  PCN placed in IR 9/29 OP clear urine UOP great amount Cr wnl To follow with Dr Abner Greenspan- Urology   Allergies: Colbert Ewing [ertapenem], Propranolol hcl, Rocephin [ceftriaxone], and Vancomycin  Medications: Prior to Admission medications   Medication Sig Start Date End Date Taking? Authorizing Provider  acetaminophen (TYLENOL) 325 MG tablet Take 325 mg by mouth 2 (two) times daily. *May take one every 6 hours as needed for pain   Yes [provider]  allopurinol (ZYLOPRIM) 100 MG tablet Take 100 mg by mouth daily.  10/14/14  Yes [provider]  Amino Acids-Protein Hydrolys (FEEDING SUPPLEMENT, PRO-STAT 64,) LIQD Take 30 mLs by mouth 3 (three) times daily with meals.   Yes [provider]  aspirin EC 81 MG tablet Take 81 mg by mouth daily.   Yes [provider]  atorvastatin (LIPITOR) 10 MG tablet Take 10 mg by mouth daily.   Yes [provider]  azelastine (ASTELIN) 0.1 % nasal spray Place 2 sprays into both nostrils 2 (two) times daily. Use in each nostril as directed   Yes [provider]  b complex vitamins tablet Take 1 tablet by mouth daily.   Yes [provider]  buPROPion (WELLBUTRIN XL) 300 MG 24 hr tablet Take 300 mg by mouth daily.    Yes [provider]  Calcium Carb-Cholecalciferol (CALCIUM CARBONATE-VITAMIN D3 PO) Take 1 tablet by mouth in the morning and at bedtime.   Yes [provider]  carvedilol (COREG) 12.5 MG tablet Take 12.5 mg by mouth 2 (two) times daily with a meal.   Yes [provider]  Cranberry 500 MG TABS Take 1 capsule by mouth daily.   Yes [provider]  dextromethorphan-guaiFENesin (MUCINEX DM) 30-600 MG per 12 hr tablet Take 1 tablet by mouth every 12 (twelve) hours as  needed for cough.    Yes [provider]  docusate sodium (COLACE) 100 MG capsule Take 100 mg by mouth at bedtime.   Yes [provider]  ezetimibe (ZETIA) 10 MG tablet Take 10 mg by mouth at bedtime.    Yes [provider]  famotidine (PEPCID) 20 MG tablet Take 20 mg by mouth 2 (two) times daily.   Yes [provider]  ferrous sulfate 325 (65 FE) MG EC tablet Take 325 mg by mouth 3 (three) times daily with meals.   Yes [provider]  fluticasone (FLONASE) 50 MCG/ACT nasal spray Place 2 sprays into both nostrils daily.  09/03/14  Yes [provider]  furosemide (LASIX) 20 MG tablet Take 1 tablet (20 mg total) by mouth daily. 11/19/21  Yes Emokpae, Courage, MD  gabapentin (NEURONTIN) 300 MG capsule Take 300 mg by mouth every 8 (eight) hours.   Yes [provider]  ipratropium-albuterol (DUONEB) 0.5-2.5 (3) MG/3ML SOLN Take 3 mLs by nebulization every 6 (six) hours as needed (shortness of breath). 11/19/21  Yes Roxan Hockey, MD  Javier Docker Oil 300 MG CAPS Take 1 capsule by mouth 2 (two) times daily.   Yes [provider]  Lactobacillus (ACIDOPHILUS PO) Take 1 tablet by mouth in the morning and at bedtime.   Yes [provider]  levothyroxine (SYNTHROID) 150 MCG tablet Take 175 mcg by mouth daily.   Yes [provider]  lisinopril (ZESTRIL) 2.5 MG tablet Take 2.5 mg  by mouth daily.   Yes [provider]  magnesium oxide (MAG-OX) 400 MG tablet Take 400 mg by mouth daily.   Yes [provider]  Menthol-Zinc Oxide (CALMOSEPTINE) 0.44-20.6 % OINT Apply 1 Application topically every 8 (eight) hours as needed (protection/prevention).   Yes [provider]  mirabegron ER (MYRBETRIQ) 25 MG TB24 tablet Take 25 mg by mouth daily.   Yes [provider]  Multiple Vitamins-Minerals (CERTAGEN PO) Take 1 tablet by mouth daily.   Yes [provider]  Nutritional Supplements (ARGINAID)  PACK Take 1 packet by mouth daily.   Yes [provider]  oxycodone (OXY-IR) 5 MG capsule Take 1 capsule (5 mg total) by mouth in the morning and at bedtime. 11/19/21  Yes Emokpae, Courage, MD  pantoprazole (PROTONIX) 40 MG tablet Take 1 tablet (40 mg total) by mouth daily. 11/20/21  Yes Emokpae, Courage, MD  potassium chloride 20 MEQ TBCR Take 10 mEq by mouth daily. 04/06/16  Yes Kathie Dike, MD  tamsulosin (FLOMAX) 0.4 MG CAPS capsule Take 1 capsule (0.4 mg total) by mouth daily after supper. 11/19/21  Yes Emokpae, Courage, MD  topiramate (TOPAMAX) 50 MG tablet Take 50 mg by mouth 2 (two) times daily.   Yes [provider]  vitamin C (ASCORBIC ACID) 500 MG tablet Take 500 mg by mouth 2 (two) times daily.   Yes [provider]  Vitamin D, Ergocalciferol, (DRISDOL) 1.25 MG (50000 UNIT) CAPS capsule Take 50,000 Units by mouth every 7 (seven) days.   Yes [provider]  leptospermum manuka honey (MEDIHONEY) PSTE paste Apply 1 Application topically daily. Apply Medihoney to right and left inner  and outer thigh wounds and left lower buttock Q day, then cover with foam dressing.  Change foam dressings Q 3 days or PRN soiling. 2. Apply moist gauze packing to right middle buttock Q day, using swab to fill, then cover with foam dressing.  (Change foam dressing Q 3 days or PRN soiling.) 2. Foam dressings to right inner groin, left buttock, right lower buttock, right upper thigh.  Change Q 3 days or PRN soiling. Apply thin layer (3 mm) to wound. Patient not taking: Reported on 12/22/2021 11/20/21   Roxan Hockey, MD  SANTYL 250 UNIT/GM ointment Apply 1 Application topically daily. 12/22/21   [provider]  Wound Dressings (PROMOGRAN EX) Apply topically daily.    [provider]     Vital Signs: BP 134/74 (BP Location: Right Arm)   Pulse 90   Temp 98.2 F (36.8 C) (Oral)   Resp 18   Ht '5\' 7"'$  (1.702 m)   Wt 260 lb 2.3 oz (118 kg)   SpO2 99%   BMI  40.74 kg/m   Physical Exam Vitals reviewed.  Skin:    General: Skin is warm.     Comments: Site Rt PCN is clean and dry NT no sign of infection OP clear yellow Over 500 cc OP yesterday  Neurological:     Mental Status: She is alert.     Imaging: [place] EKG SITE RITE  Result Date: 12/25/2021 If Site Rite image not attached, placement could not be confirmed due to current cardiac rhythm.  IR NEPHROSTOMY PLACEMENT RIGHT  Result Date: 12/24/2021 INDICATION: 76 year old female referred for percutaneous nephrostomy. Current sepsis and a history of paraplegia EXAM: IMAGE GUIDED PERCUTANEOUS NEPHROSTOMY COMPARISON:  None MEDICATIONS: 500 mg IV Levaquin ANESTHESIA/SEDATION: Moderate (conscious) sedation was employed during this procedure. A total of Versed 0.5 mg and Fentanyl  25 mcg was administered intravenously by the radiology nurse. Total intra-service moderate Sedation Time: 13 minutes. The patient's level of consciousness and vital signs were monitored continuously by radiology nursing throughout the procedure under my direct supervision. CONTRAST:  15 cc-administered into the collecting system(s) FLUOROSCOPY: Radiation Exposure Index (as provided by the fluoroscopic device): 2 mGy Kerma COMPLICATIONS: None PROCEDURE: Informed written consent was obtained from the patient and the patient's family after a thorough discussion of the procedural risks, benefits and alternatives. All questions were addressed. Maximal Sterile Barrier Technique was utilized including caps, mask, sterile gowns, sterile gloves, sterile drape, hand hygiene and skin antiseptic. A timeout was performed prior to the initiation of the procedure. Patient positioned prone position on the fluoroscopy table. Ultrasound survey of the right flank was performed with images stored and sent to PACs. The patient was then prepped and draped in the usual sterile fashion. 1% lidocaine was used to anesthetize the skin and subcutaneous tissues  for local anesthesia. A Chiba needle was then used to access a posterior inferior calyx with ultrasound guidance. With spontaneous urine returned through the needle, passage of an 018 micro wire into the collecting system was performed under fluoroscopy. A small incision was made with an 11 blade scalpel, and the needle was removed from the wire. An Accustick system was then advanced over the wire into the collecting system under fluoroscopy. The metal stiffener and inner dilator were removed. Contrast was injected confirming location in the collecting system. Bentson wire was passed into the collecting system and the sheath removed. Ten French dilation of the soft tissues was performed. Using modified Seldinger technique, a 10 French pigtail catheter drain was placed over the Bentson wire. Wire and inner stiffener removed, and the pigtail was formed in the collecting system. Approximately 110 cc of frank pyonephrosis was aspirated. Sample was sent to the lab. Small amount of contrast confirmed position of the catheter. Patient tolerated the procedure well and remained hemodynamically stable throughout. No complications were encountered and no significant blood loss encountered IMPRESSION: Status post image guided right-sided percutaneous nephrostomy, for decompression of frank pyonephrosis. Signed, Dulcy Fanny. Nadene Rubins, RPVI [facility] Radiology Electronically Signed   By: Corrie Mckusick D.O.   On: 12/24/2021 09:31    Labs:  CBC: Recent Labs    12/25/21 1831 12/26/21 0213 12/27/21 0459 12/28/21 0303  WBC 10.2 8.1 8.0 11.0*  HGB 8.4* 7.6* 7.1* 8.3*  HCT 26.5* 23.9* 23.2* 26.6*  PLT 299 269 252 265    COAGS: Recent Labs    11/14/21 2053 11/15/21 0352 12/24/21 0244  INR 1.4* 1.5* 1.4*  APTT 48*  --   --     BMP: Recent Labs    12/25/21 0541 12/26/21 0213 12/27/21 0459 12/28/21 0303  NA 132* 134* 137 135  K 3.8 3.5 3.0* 4.6  CL  108 108 113* 108  CO2 19* 20* 19* 19*  GLUCOSE 84 89 90 77  BUN '20 14 13 11  '$ CALCIUM 7.7* 7.8* 6.7* 8.1*  CREATININE 0.54 0.51 0.49 0.53  GFRNONAA >60 >60 >60 >60    LIVER FUNCTION TESTS: Recent Labs    11/14/21 2053 11/15/21 0352 11/18/21 0310 12/22/21 1342  BILITOT 1.0 0.7 0.6 1.0  AST 46* 50* 46* 29  ALT 29 32 40 21  ALKPHOS 139* 130* 101 146*  PROT 6.6 5.9* 5.2* 6.8  ALBUMIN 2.8* 2.3* 2.0* 2.2*    Assessment and Plan:  Rt PCN intact Will  follow with Urology Call IR if any needs  Electronically Signed: Lavonia Drafts, PA-C 12/28/2021, 7:47 AM   I spent a total of 15 Minutes at the the patient's bedside AND on the patient's hospital floor or unit, greater than 50% of which was counseling/coordinating care for Rt PCN placement

## 2021-12-28 NOTE — Care Management Important Message (Signed)
Important Message  Patient Details  Name: [name] MRN: [id number] Date of Birth: 02/12/1946   Medicare Important Message Given:  Yes     Orbie Pyo 12/28/2021, 2:22 PM

## 2021-12-28 NOTE — Telephone Encounter (Signed)
-----   Message from Janith Lima, MD sent at 12/28/2021  8:27 AM EDT ----- Regarding: FW: F/u in [place],  Just making sure you received this message and can arrange f/u to see Dr. Felipa Eth in the [place] office in a few weeks? Thanks.  MG  ----- Message ----- From: Janith Lima, MD Sent: 12/23/2021   5:04 PM EDT To: Dorisann Frames, RN Subject: F/u in [place],  I saw this pt at [facility] who has right ureteral and renal stones. She is getting a PCN tomorrow. She sees Dr. Felipa Eth and family would like to arrange f/u in [place] to discuss treating her ureteral and renal stones. Can you arrange followup with her in about 2-3 weeks?  May be best to call her daughter, [name] who is in the chart.   Thanks!  Windell [place]

## 2021-12-28 NOTE — Telephone Encounter (Signed)
I spoke with [name], patient daughter, notified of appt date time.  Also placed in system and will be on AVS when patient is discharged back to facility.

## 2021-12-28 NOTE — Progress Notes (Signed)
PROGRESS NOTE                                                                                                                                                                                                             Patient Demographics:    [name], is a 76 y.o. female, DOB - 11/24/1945, KCL:[id number]  Outpatient Primary MD for the patient is Mal Amabile, Anthony Sar, MD    LOS - 6  Admit date - 12/22/2021    Chief Complaint  Patient presents with   Altered Mental Status       Brief Narrative (HPI from H&P)    76 y.o. female, who presented to the [facility] from [facility] with a chief complaint of AMS.  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  She was diagnosed with right-sided kidney stone, urosepsis, she was transferred to [facility], ICU for further care.  She was placed on IV pressors for a few days, stabilized, she was seen by urology, IR underwent right nephrostomy tube placement.  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  She was stabilized and transferred to hospitalist service on 12/26/2021 on day 4 of her hospital stay.   Subjective:   Patient in bed, appears comfortable, denies any headache, no fever, no chest pain or pressure, no shortness of breath , no abdominal pain. No new focal weakness.    Assessment  & Plan :   Septic shock caused by right obstructing ureteric stone with pyelonephritis.  She has been seen by IR, urology and PCCM, briefly required IV pressors, sepsis pathophysiology has resolved, culture data inconclusive, she is on empiric antibiotics and improving.  Underwent right-sided nephrostomy tube placement by IR on 12/24/2021.  Right nephrostomy  tube care instruction placed below in the note, she will follow-up with urology postdischarge according to the urologist, appointment in AVS.  ID has recommended 10 days of total antibiotics stable address antibiotics based on cultures.  Clinically stable likely discharge in the next 1 to 2 days.  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  Nonspecific  possible rectal wall adjacent intramural abscess.  Is discussed with general surgeon Dr Donne Hazel and ID on 12/27/2021, plan is no surgical intervention at this time as these changes appear chronic, outpatient follow-up with general surgery postdischarge.  Baseline paraplegia due to spinal ischemic injury in the past, stage sacral decubitus ulcer present on admission.  Indwelling Foley catheter.  Foley catheter was changed on 12/25/2021, wound care and supportive care continue for her chronic issues.  AKI due to sepsis.  Improved.  Hypokalemia, hypophosphatemia, hypomagnesemia.  Replaced.  GERD.  On Pepcid.  Hypothyroidism.  Stable TSH continue Synthroid.  Anemia of chronic disease.  Monitor, some fall due to heme dilution and frequent lab draws.    DM type II.  On sliding scale.  Lab Results  Component Value Date   HGBA1C 6.0 (H) 11/15/2021   CBG (last 3)  Recent Labs    12/27/21 0736 12/27/21 1114 12/27/21 1512  GLUCAP 98 116* 94    12/24/2021.  Right nephrostomy tube placed 12/25/2021.  Indwelling Foley catheter exchanged. 12/25/2021. L.Arm PICC   Discharge planning for the Right nephrostomy tube per IR: Please contact IR APP or on call IR MD prior to patient d/c to ensure appropriate follow up plans are in place. Typically patient will follow up with IR clinic 10-14 days post d/c for repeat imaging/possible drain injection. IR scheduler will contact patient with date/time of appointment. Patient will need to flush drain QD with 5  cc NS, record output QD, dressing changes every 2-3 days or earlier if soiled.        Condition - Extremely Guarded  Family Communication  : Daughter [name] [phone] on 12/26/2021 at 9:56 AM  Code Status : Full  Consults  : PCCM, urology, general surgery, urology, IR, ID, palliative care  PUD Prophylaxis :   Significant events and procedures  :     9/27 Presented to [facility], meropenem, PCCM consult CT abdomen/pelvis 9/28 >> cirrhotic changes without mass, normal gallbladder, severe right hydronephrosis with chronic right nephrolithiasis proximally and distally.  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  There is a 32m low-density area in the rectal wall along the lateral aspect that could represent a small intramural abscess.  Decubitus ulcer overlying the right ischial tuberosity extends almost to bone C. difficile toxin 9/28 >> negative Urine culture 9/27 >> multiple species, needs to be recollected Blood cultures 9/27 >>  12/24/2021.  Right nephrostomy tube placed 12/25/2021.  Indwelling Foley catheter exchanged. 12/25/2021. L.Arm PICC 12/26/2021.  [facility]     Disposition Plan  :    Status is: Inpatient  DVT Prophylaxis  :    heparin injection 5,000 Units Start: 12/22/21 2200 SCDs Start: 12/22/21 1926  Lab Results  Component Value Date   PLT 265 12/28/2021    Diet :  Diet Order             Diet regular Room service appropriate? Yes; Fluid consistency: Thin  Diet effective now                    Inpatient Medications  Scheduled Meds:  aspirin  81 mg Oral Daily   atorvastatin  10 mg Oral Daily   buPROPion  300 mg Oral Daily   Chlorhexidine Gluconate Cloth  6 each Topical Daily   ezetimibe  10 mg Oral QHS   famotidine  20 mg Oral BID  gabapentin  300 mg Oral Q8H   heparin  5,000 Units Subcutaneous Q8H    levothyroxine  175 mcg Oral Q0600   midodrine  5 mg Oral TID WC   mouth rinse  15 mL Mouth Rinse 4 times per day   sodium chloride flush  10-40 mL Intracatheter Q12H   Continuous Infusions:  sodium chloride Stopped (12/22/21 2250)   imipenem-cilastatin 1,000 mg (12/28/21 0802)   PRN Meds:.acetaminophen, docusate sodium, ipratropium-albuterol, lip balm, mouth rinse, polyethylene glycol  Time Spent in minutes  30   Lala Lund M.D on 12/28/2021 at 9:39 AM  To page go to [email or web address]   [facility] -  Office  [phone]  See all Orders from today for further details    Objective:   Vitals:   12/27/21 2058 12/28/21 0500 12/28/21 0600 12/28/21 0804  BP: 122/65  134/74 118/66  Pulse: 79  90 89  Resp: 18  18   Temp: 98.5 F (36.9 C)  98.2 F (36.8 C) 98.9 F (37.2 C)  TempSrc: Oral  Oral Oral  SpO2: 100%  99% 99%  Weight:  118 kg    Height:        Wt Readings from Last 3 Encounters:  12/28/21 118 kg  11/18/21 114 kg  06/04/20 111.6 kg     Intake/Output Summary (Last 24 hours) at 12/28/2021 0939 Last data filed at 12/28/2021 0600 Gross per 24 hour  Intake 250 ml  Output 1750 ml  Net -1500 ml     Physical Exam  Awake Alert, No new F.N deficits, Normal affect [email or web address].AT,PERRAL Supple Neck, No JVD,   Symmetrical Chest wall movement, Good air movement bilaterally, CTAB RRR,No Gallops, Rubs or new Murmurs,  +ve B.Sounds, Abd Soft, No tenderness,    Bilateral paraplegia, both ankles with chronic decubitus ulcers present on admission under bandage, indwelling Foley catheter switched on 12/25/2021, right nephrostomy tube.    RN pressure injury documentation: Pressure Injury 11/15/21 Buttocks Right;Lower Deep Tissue Pressure Injury - Purple or maroon localized area of discolored intact skin or blood-filled blister due to damage of underlying soft tissue from pressure and/or shear. (Active)  11/15/21   Location: Buttocks  Location Orientation: Right;Lower   Staging: Deep Tissue Pressure Injury - Purple or maroon localized area of discolored intact skin or blood-filled blister due to damage of underlying soft tissue from pressure and/or shear.  Wound Description (Comments):   Present on Admission: Yes  Dressing Type None 12/27/21 1800     Pressure Injury 12/22/21 Thigh Left;Posterior Unstageable - Full thickness tissue loss in which the base of the injury is covered by slough (yellow, tan, gray, green or brown) and/or eschar (tan, brown or black) in the wound bed. slough present (Active)  12/22/21 1915  Location: Thigh  Location Orientation: Left;Posterior  Staging: Unstageable - Full thickness tissue loss in which the base of the injury is covered by slough (yellow, tan, gray, green or brown) and/or eschar (tan, brown or black) in the wound bed.  Wound Description (Comments): slough present  Present on Admission: Yes  Dressing Type Foam - Lift dressing to assess site every shift 12/27/21 2200     Pressure Injury 12/22/21 Thigh Distal;Left;Posterior Unstageable - Full thickness tissue loss in which the base of the injury is covered by slough (yellow, tan, gray, green or brown) and/or eschar (tan, brown or black) in the wound bed. (Active)  12/22/21 1915  Location: Thigh  Location Orientation: Distal;Left;Posterior  Staging: Unstageable - Full thickness tissue loss in  which the base of the injury is covered by slough (yellow, tan, gray, green or brown) and/or eschar (tan, brown or black) in the wound bed.  Wound Description (Comments):   Present on Admission: Yes  Dressing Type Foam - Lift dressing to assess site every shift 12/27/21 2200     Pressure Injury 12/22/21 Ischial tuberosity Left Stage 4 - Full thickness tissue loss with exposed bone, tendon or muscle. deep tunneling wound (Active)  12/22/21 1915  Location: Ischial tuberosity  Location Orientation: Left  Staging: Stage 4 - Full thickness tissue loss with exposed bone, tendon or  muscle.  Wound Description (Comments): deep tunneling wound  Present on Admission: Yes  Dressing Type Foam - Lift dressing to assess site every shift 12/27/21 2200     Data Review:    CBC Recent Labs  Lab 12/22/21 1342 12/22/21 2123 12/24/21 0244 12/25/21 0541 12/25/21 0910 12/25/21 1831 12/26/21 0213 12/27/21 0459 12/28/21 0303  WBC 35.7*   < > 18.5*   < > 9.1 10.2 8.1 8.0 11.0*  HGB 7.1*   < > 7.2*   < > 6.6* 8.4* 7.6* 7.1* 8.3*  HCT 23.8*   < > 23.7*   < > 20.4* 26.5* 23.9* 23.2* 26.6*  PLT 470*   < > 444*   < > 268 299 269 252 265  MCV 89.8   < > 86.8   < > 85.4 86.0 86.3 87.2 88.1  MCH 26.8   < > 26.4   < > 27.6 27.3 27.4 26.7 27.5  MCHC 29.8*   < > 30.4   < > 32.4 31.7 31.8 30.6 31.2  RDW 17.2*   < > 17.1*   < > 16.9* 16.8* 16.8* 16.9* 17.2*  LYMPHSABS 1.9  --  1.6  --   --   --   --   --   --   MONOABS 1.9*  --  0.9  --   --   --   --   --   --   EOSABS 0.1  --  0.1  --   --   --   --   --   --   BASOSABS 0.1  --  0.1  --   --   --   --   --   --    < > = values in this interval not displayed.    Electrolytes Recent Labs  Lab 12/22/21 1342 12/22/21 2214 12/23/21 0310 12/24/21 0244 12/25/21 0541 12/26/21 0213 12/27/21 0459 12/28/21 0303  NA 127* 132* 133* 134* 132* 134* 137 135  K 5.9* 4.9 4.6 4.2 3.8 3.5 3.0* 4.6  CL 102 107 106 110 108 108 113* 108  CO2 12* 16* 12* 17* 19* 20* 19* 19*  GLUCOSE 80 110* 136* 129* 84 89 90 77  BUN 83* 72* 64* 36* '20 14 13 11  '$ CREATININE 1.72* 1.67* 1.46* 0.73 0.54 0.51 0.49 0.53  CALCIUM 8.2* 8.1* 8.3* 8.2* 7.7* 7.8* 6.7* 8.1*  AST 29  --   --   --   --   --   --   --   ALT 21  --   --   --   --   --   --   --   ALKPHOS 146*  --   --   --   --   --   --   --   BILITOT 1.0  --   --   --   --   --   --   --  ALBUMIN 2.2*  --   --   --   --   --   --   --   MG 2.8* 2.5* 2.4  --  1.7 1.8 1.7 1.6*  PROCALCITON  --  0.84  --   --   --   --   --   --   LATICACIDVEN 1.1  --   --   --   --   --   --   --   INR  --   --   --   1.4*  --   --   --   --   TSH  --  3.775  --   --   --   --   --   --   AMMONIA 14  --   --   --   --   --   --   --   BNP 38.0  --   --   --   --   --   --   --    ID Labs Recent Labs  Lab 12/22/21 1342 12/22/21 2123 12/22/21 2214 12/23/21 0310 12/24/21 0244 12/25/21 0541 12/25/21 0910 12/25/21 1831 12/26/21 0213 12/27/21 0459 12/28/21 0303  WBC 35.7*   < >  --    < > 18.5* 9.5 9.1 10.2 8.1 8.0 11.0*  PLT 470*   < >  --    < > 444* 291 268 299 269 252 265  PROCALCITON  --   --  0.84  --   --   --   --   --   --   --   --   LATICACIDVEN 1.1  --   --   --   --   --   --   --   --   --   --   CREATININE 1.72*  --  1.67*   < > 0.73 0.54  --   --  0.51 0.49 0.53   < > = values in this interval not displayed.    Radiology Reports [place] EKG SITE RITE  Result Date: 12/25/2021 If Site Rite image not attached, placement could not be confirmed due to current cardiac rhythm.

## 2021-12-29 ENCOUNTER — Encounter (HOSPITAL_COMMUNITY): Payer: Self-pay | Admitting: Internal Medicine

## 2021-12-29 DIAGNOSIS — Z978 Presence of other specified devices: Secondary | ICD-10-CM | POA: Diagnosis not present

## 2021-12-29 DIAGNOSIS — R4182 Altered mental status, unspecified: Secondary | ICD-10-CM | POA: Diagnosis not present

## 2021-12-29 DIAGNOSIS — N131 Hydronephrosis with ureteral stricture, not elsewhere classified: Secondary | ICD-10-CM

## 2021-12-29 DIAGNOSIS — A419 Sepsis, unspecified organism: Secondary | ICD-10-CM | POA: Diagnosis not present

## 2021-12-29 LAB — CBC WITH DIFFERENTIAL/PLATELET
Abs Immature Granulocytes: 0.12 10*3/uL — ABNORMAL HIGH (ref 0.00–0.07)
Basophils Absolute: 0.1 10*3/uL (ref 0.0–0.1)
Basophils Relative: 1 %
Eosinophils Absolute: 0.2 10*3/uL (ref 0.0–0.5)
Eosinophils Relative: 2 %
HCT: 26.5 % — ABNORMAL LOW (ref 36.0–46.0)
Hemoglobin: 8.1 g/dL — ABNORMAL LOW (ref 12.0–15.0)
Immature Granulocytes: 1 %
Lymphocytes Relative: 28 %
Lymphs Abs: 2.6 10*3/uL (ref 0.7–4.0)
MCH: 26.9 pg (ref 26.0–34.0)
MCHC: 30.6 g/dL (ref 30.0–36.0)
MCV: 88 fL (ref 80.0–100.0)
Monocytes Absolute: 0.6 10*3/uL (ref 0.1–1.0)
Monocytes Relative: 6 %
Neutro Abs: 5.7 10*3/uL (ref 1.7–7.7)
Neutrophils Relative %: 62 %
Platelets: 266 10*3/uL (ref 150–400)
RBC: 3.01 MIL/uL — ABNORMAL LOW (ref 3.87–5.11)
RDW: 17.2 % — ABNORMAL HIGH (ref 11.5–15.5)
WBC: 9.2 10*3/uL (ref 4.0–10.5)
nRBC: 0 % (ref 0.0–0.2)

## 2021-12-29 LAB — BASIC METABOLIC PANEL
Anion gap: 8 (ref 5–15)
BUN: 13 mg/dL (ref 8–23)
CO2: 20 mmol/L — ABNORMAL LOW (ref 22–32)
Calcium: 8.3 mg/dL — ABNORMAL LOW (ref 8.9–10.3)
Chloride: 106 mmol/L (ref 98–111)
Creatinine, Ser: 0.63 mg/dL (ref 0.44–1.00)
GFR, Estimated: >60 mL/min (ref >60)
Glucose, Bld: 82 mg/dL (ref 70–99)
Potassium: 4.5 mmol/L (ref 3.5–5.1)
Sodium: 134 mmol/L — ABNORMAL LOW (ref 135–145)

## 2021-12-29 LAB — BRAIN NATRIURETIC PEPTIDE: B Natriuretic Peptide: 155.2 pg/mL — ABNORMAL HIGH (ref 0.0–100.0)

## 2021-12-29 LAB — MAGNESIUM: Magnesium: 1.8 mg/dL (ref 1.7–2.4)

## 2021-12-29 MED ORDER — ACETAMINOPHEN 325 MG PO TABS: 325.0000 mg | ORAL_TABLET | Freq: Four times a day (QID) | ORAL | Status: AC | PRN

## 2021-12-29 MED ORDER — PIPERACILLIN-TAZOBACTAM IV (FOR PTA / DISCHARGE USE ONLY): 3.3750 g | Freq: Three times a day (TID) | INTRAVENOUS | Status: DC

## 2021-12-29 MED ORDER — MIDODRINE HCL 5 MG PO TABS: 5.0000 mg | ORAL_TABLET | Freq: Three times a day (TID) | ORAL | Status: DC

## 2021-12-29 MED ORDER — POLYETHYLENE GLYCOL 3350 17 G PO PACK: 17.0000 g | PACK | Freq: Every day | ORAL | 0 refills | Status: AC | PRN

## 2021-12-29 NOTE — TOC Transition Note (Signed)
Transition of Care [facility]) - CM/SW Discharge Note   Patient Details  Name: [name] MRN: [id number] Date of Birth: 21-Nov-1945  [facility] CM/SW Contact:  Milinda Antis, [place] Phone Number: 12/29/2021, 2:13 PM   Clinical Narrative:    Patient will DC to:  [place] Anticipated DC date:  12/29/2021 Family notified: Yes Transport by: Corey Harold   Per MD patient ready for DC to . RN to call report prior to discharge [phone] room 202B). RN,  patient's family, and facility notified of DC. Discharge Summary and FL2 sent to facility. DC packet on chart. Ambulance transport requested for patient.   CSW will sign off for now as social work intervention is no longer needed. Please consult [place] again if new needs arise.     Final next level of care: Skilled Nursing Facility Barriers to Discharge: Barriers Resolved   Patient Goals and CMS Choice        Discharge Placement              Patient chooses bed at:  [facility]) Patient to be transferred to facility by: [place] Name of family member notified: Carmie End (Daughter)   [phone] Patient and family notified of of transfer: 12/29/21  Discharge Plan and Services                                     Social Determinants of Health (SDOH) Interventions     Readmission Risk Interventions    11/15/2021   12:49 PM  Readmission Risk Prevention Plan  Transportation Screening Complete  HRI or [place] Complete  Social Work Consult for [place] Planning/Counseling Complete  Palliative Care Screening Not Applicable  Medication Review Press photographer) Complete

## 2021-12-29 NOTE — Discharge Summary (Signed)
Physician Discharge Summary   Patient: [name] MRN: [id number] DOB: [birth year]  Admit date:     12/22/2021  Discharge date: 12/29/21  Discharge Physician: Estill Cotta, MD    PCP: Hilbert Corrigan, MD   Recommendations at discharge:   Continue IV Zosyn, stop date on 01/03/2022 Weekly labs while on IV antibiotics, CBC, CMP.  Fax weekly labs to [phone] Please pull PICC line at the completion of IV antibiotics Antihypertensives currently on hold.  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 female, who presented to the [facility] from [facility] with a chief complaint of AMS.  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  She was diagnosed with right-sided kidney stone, urosepsis, she was transferred to [facility], ICU for further care.  She was placed on IV pressors for a few days, stabilized, she was seen by urology, IR underwent right nephrostomy tube placement.  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  She was stabilized and transferred to hospitalist service on 12/26/2021 on day 4 of her hospital  stay.  Assessment and Plan:  Septic shock caused by right obstructing ureteric stone with pyelonephritis.  -Initial admitted to ICU by CCM, briefly required IV pressors, placed on empiric antibiotics -Sepsis physiology has resolved.  Urology and interventional radiology has evaluated, underwent right-sided nephrostomy tube placement by IR on 12/24/2021 -She will follow-up with urology postdischarge, per Dr. Abner Greenspan (urology ), her follow-up appointment is scheduled in [place] -ID was also consulted, recommended 10 days of antibiotics, IV Zosyn, stop date on 01/03/2022.  Instructions as above.    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  Nonspecific possible rectal wall adjacent intramural abscess.  -Seen by general surgery on 10/2, Dr. Donne Hazel, recommended pressure displacement as much as feasible, do think that she may benefit from diverting colostomy at some point can be done as an outpatient.  Patient will follow-up in office after discharge.    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   Indwelling Foley catheter. -Foley catheter was changed on 9/30.   - Continue wound care and supportive care for the chronic issues    AKI due to sepsis. -Resolved   Hypokalemia, hypophosphatemia, hypomagnesemia. -Electrolytes were replaced   GERD. Continue Pepcid   Hypothyroidism.  -TSH stable, continue Synthroid   Anemia of chronic disease.  -Hemoglobin 8.1 at discharge   DM type II.   -Continue sliding scale insulin as needed         Pain control - [place] Controlled Substance Reporting System database was reviewed. and patient was instructed, not to drive, operate heavy machinery, perform activities at heights, swimming or  participation in water activities or provide baby-sitting services while  on Pain, Sleep and Anxiety Medications; until their outpatient Physician has advised to do so again. Also recommended to not to take more than prescribed Pain, Sleep and Anxiety Medications.  Consultants: Admitted by CCM, general surgery, infectious disease, palliative medicine, IR, urology Procedures performed: 12/24/2021.  Right nephrostomy tube placed 12/25/2021.  Indwelling Foley catheter exchanged. 12/25/2021. L.Arm PICC   Disposition: Skilled nursing facility Diet recommendation:  Discharge Diet Orders (From admission, onward)     Start     Ordered   12/29/21 0000  Diet - low sodium heart healthy        12/29/21 1149            DISCHARGE MEDICATION: Allergies as of 12/29/2021       Reactions   Invanz [ertapenem] Other (See Comments)   Patient states allergy but is not certain    Propranolol Hcl    Rocephin [ceftriaxone] Hives   Vancomycin Hives        Medication List     STOP taking these medications    allopurinol 100 MG tablet Commonly known as: ZYLOPRIM   carvedilol 12.5 MG tablet Commonly known as: COREG   furosemide 20 MG tablet Commonly known as: LASIX   lisinopril 2.5 MG tablet Commonly known as: ZESTRIL   oxycodone 5 MG capsule Commonly known as: OXY-IR   topiramate 50 MG tablet Commonly known as: TOPAMAX       TAKE these medications    acetaminophen 325 MG tablet Commonly known as: TYLENOL Take 1 tablet (325 mg total) by mouth every 6 (six) hours as needed for mild pain, moderate pain, fever or headache. *May take one every 6 hours as needed for pain What changed:  when to take this reasons to take this   ACIDOPHILUS PO Take 1 tablet by mouth in the morning and at bedtime.   Arginaid Pack Take 1 packet by mouth daily.   ascorbic acid 500 MG tablet Commonly known as: VITAMIN C Take 500 mg by mouth 2 (two) times daily.   aspirin EC 81 MG tablet Take 81 mg by mouth daily.   atorvastatin 10 MG tablet Commonly known as:  LIPITOR Take 10 mg by mouth daily.   azelastine 0.1 % nasal spray Commonly known as: ASTELIN Place 2 sprays into both nostrils 2 (two) times daily. Use in each nostril as directed   b complex vitamins tablet Take 1 tablet by mouth daily.   buPROPion 300 MG 24 hr tablet Commonly known as: WELLBUTRIN XL Take 300 mg by mouth daily.   CALCIUM CARBONATE-VITAMIN D3 PO Take 1 tablet by mouth in the morning and at bedtime.   Calmoseptine 0.44-20.6 % Oint Generic drug: Menthol-Zinc Oxide Apply 1 Application topically every 8 (eight) hours as needed (protection/prevention).   CERTAGEN PO Take 1 tablet by mouth daily.   Cranberry 500 MG Tabs Take 1 capsule by mouth daily.   dextromethorphan-guaiFENesin 30-600 MG 12hr tablet Commonly known as: MUCINEX DM Take 1 tablet by mouth every 12 (twelve) hours as needed for cough.   docusate sodium 100 MG capsule Commonly known as: COLACE Take 100 mg by mouth at bedtime.   ezetimibe 10 MG tablet Commonly known as: ZETIA Take 10 mg by mouth at bedtime.   famotidine 20 MG tablet Commonly known as: PEPCID Take 20 mg by mouth 2 (two) times daily.   feeding supplement (PRO-STAT 64) Liqd Take 30 mLs by mouth 3 (three) times daily  with meals.   ferrous sulfate 325 (65 FE) MG EC tablet Take 325 mg by mouth 3 (three) times daily with meals.   fluticasone 50 MCG/ACT nasal spray Commonly known as: FLONASE Place 2 sprays into both nostrils daily.   gabapentin 300 MG capsule Commonly known as: NEURONTIN Take 300 mg by mouth every 8 (eight) hours.   ipratropium-albuterol 0.5-2.5 (3) MG/3ML Soln Commonly known as: DUONEB Take 3 mLs by nebulization every 6 (six) hours as needed (shortness of breath).   Krill Oil 300 MG Caps Take 1 capsule by mouth 2 (two) times daily.   levothyroxine 150 MCG tablet Commonly known as: SYNTHROID Take 175 mcg by mouth daily.   magnesium oxide 400 MG tablet Commonly known as: MAG-OX Take 400 mg by mouth  daily.   midodrine 5 MG tablet Commonly known as: PROAMATINE Take 1 tablet (5 mg total) by mouth 3 (three) times daily with meals.   mirabegron ER 25 MG Tb24 tablet Commonly known as: MYRBETRIQ Take 25 mg by mouth daily.   pantoprazole 40 MG tablet Commonly known as: PROTONIX Take 1 tablet (40 mg total) by mouth daily.   piperacillin-tazobactam  IVPB Commonly known as: ZOSYN Inject 3.375 g into the vein every 8 (eight) hours. **Run Piperacillin/tazobactam 3.375g over 4 hours Extended infusion** Indication:  UTI w/ obstructive renal calculi with severe hydronephrosis  First Dose: Yes Last Day of Therapy:  01/03/22 Labs - Once weekly:  CBC/D and BMP, Labs - Every other week:  ESR and CRP Method of administration: Elastomeric (Continuous infusion) Method of administration may be changed at the discretion of home infusion pharmacist based upon assessment of the patient and/or caregiver's ability to self-administer the medication ordered.   polyethylene glycol 17 g packet Commonly known as: MIRALAX / GLYCOLAX Take 17 g by mouth daily as needed for moderate constipation.   Potassium Chloride ER 20 MEQ Tbcr Take 10 mEq by mouth daily.   PROMOGRAN EX Apply topically daily. What changed: Another medication with the same name was removed. Continue taking this medication, and follow the directions you see here.   Santyl 250 UNIT/GM ointment Generic drug: collagenase Apply 1 Application topically daily.   tamsulosin 0.4 MG Caps capsule Commonly known as: FLOMAX Take 1 capsule (0.4 mg total) by mouth daily after supper.   Vitamin D (Ergocalciferol) 1.25 MG (50000 UNIT) Caps capsule Commonly known as: DRISDOL Take 50,000 Units by mouth every 7 (seven) days.               Discharge Care Instructions  (From admission, onward)           Start     Ordered   12/29/21 0000  Change dressing on IV access line weekly and PRN  (Home infusion instructions - [facility] )        12/29/21 1146   12/29/21 0000  Discharge wound care:       Comments: Cleanse coccyx wound with Soap and water and pat dry. Apply skin protectant and barrier ring around opening.  Apply rectal pouch. Cut spout off bottom and close with clamp.  Change twice weekly and PRN leakage.    Keep buttocks and perineal skin clean and dry. Apply moisture barrier ointment twice daily and PRN soilage.  No disposable pads or briefs.      12/29/21 1149            Follow-up Information     Surgery, [place]. Schedule an appointment as soon as  possible for a visit in 2 week(s).   Specialty: General Surgery Why: for hospital follow-up Contact information: [address] [place] [address] [phone]         Hilbert Corrigan, MD. Schedule an appointment as soon as possible for a visit in 2 week(s).   Specialty: Internal Medicine Why: for hospital follow-up Contact information: [address] [place] [address] [phone]         Janith Lima, MD. Schedule an appointment as soon as possible for a visit in 2 week(s).   Specialty: Urology Why: for hospital follow-up Contact information: [address] [place] [address] [phone]                Discharge Exam: Filed Weights   12/26/21 0500 12/28/21 0500 12/29/21 0500  Weight: 119.1 kg 118 kg 108.9 kg   S: No acute complaints.  No fevers or chills, cleared by ID for discharge  Vitals:   12/29/21 0343 12/29/21 0500 12/29/21 0832 12/29/21 0834  BP: (!) 151/65  132/75 133/74  Pulse: 69  (!) 111 (!) 109  Resp: _0 Temp: 97.8 F (36.6 C)  97.8 F (36.6 C)   TempSrc: Oral  Oral   SpO2: 100%  99% 97%  Weight:  108.9 kg    Height:        Physical Exam General: Alert and oriented x 3, NAD Cardiovascular: S1 S2 clear, RRR.  Respiratory: CTAB, no wheezing, rales or rhonchi Gastrointestinal: Soft, nontender, nondistended, NBS, right nephrostomy tube Ext:  bilateral paraplegia, both ankles with chronic decub ulcers POA Psych: Normal affect and demeanor, alert and oriented x3    Condition at discharge: fair  The results of significant diagnostics from this hospitalization (including imaging, microbiology, ancillary and laboratory) are listed below for reference.   Imaging Studies: [place] EKG SITE RITE  Result Date: 12/25/2021 If Site Rite image not attached, placement could not be confirmed due to current cardiac rhythm.  IR NEPHROSTOMY PLACEMENT RIGHT  Result Date: 12/24/2021 INDICATION: 76 year old female referred for percutaneous nephrostomy. Current sepsis and a history of paraplegia EXAM: IMAGE GUIDED PERCUTANEOUS NEPHROSTOMY COMPARISON:  None MEDICATIONS: 500 mg IV Levaquin ANESTHESIA/SEDATION: Moderate (conscious) sedation was employed during this procedure. A total of Versed 0.5 mg and Fentanyl 25 mcg was administered intravenously by the radiology nurse. Total intra-service moderate Sedation Time: 13 minutes. The patient's level of consciousness and vital signs were monitored continuously by radiology nursing throughout the procedure under my direct supervision. CONTRAST:  15 cc-administered into the collecting system(s) FLUOROSCOPY: Radiation Exposure Index (as provided by the fluoroscopic device): 2 mGy Kerma COMPLICATIONS: None PROCEDURE: Informed written consent was obtained from the patient and the patient's family after a thorough discussion of the procedural risks, benefits and alternatives. All questions were addressed. Maximal Sterile Barrier Technique was utilized including caps, mask, sterile gowns, sterile gloves, sterile drape, hand hygiene and skin antiseptic. A timeout was performed prior to the initiation of the procedure. Patient positioned prone position on the fluoroscopy table. Ultrasound survey of the right flank was performed with images stored and sent to PACs. The patient was then prepped and draped in the usual sterile  fashion. 1% lidocaine was used to anesthetize the skin and subcutaneous tissues for local anesthesia. A Chiba needle was then used to access a posterior inferior calyx with ultrasound guidance. With spontaneous urine returned through the needle, passage of an 018 micro wire into the collecting system was performed under  fluoroscopy. A small incision was made with an 11 blade scalpel, and the needle was removed from the wire. An Accustick system was then advanced over the wire into the collecting system under fluoroscopy. The metal stiffener and inner dilator were removed. Contrast was injected confirming location in the collecting system. Bentson wire was passed into the collecting system and the sheath removed. Ten French dilation of the soft tissues was performed. Using modified Seldinger technique, a 10 French pigtail catheter drain was placed over the Bentson wire. Wire and inner stiffener removed, and the pigtail was formed in the collecting system. Approximately 110 cc of frank pyonephrosis was aspirated. Sample was sent to the lab. Small amount of contrast confirmed position of the catheter. Patient tolerated the procedure well and remained hemodynamically stable throughout. No complications were encountered and no significant blood loss encountered IMPRESSION: Status post image guided right-sided percutaneous nephrostomy, for decompression of frank pyonephrosis. Signed, Dulcy Fanny. Nadene Rubins, RPVI [facility] Electronically Signed   By: Corrie Mckusick D.O.   On: 12/24/2021 09:31   CT ABDOMEN PELVIS W CONTRAST  Result Date: 12/23/2021 CLINICAL DATA:  Sacral wound leaking stool. EXAM: CT ABDOMEN AND PELVIS WITH CONTRAST TECHNIQUE: Multidetector CT imaging of the abdomen and pelvis was performed using the standard protocol following bolus administration of intravenous contrast. RADIATION DOSE REDUCTION: This exam was performed according to the  departmental dose-optimization program which includes automated exposure control, adjustment of the mA and/or kV according to patient size and/or use of iterative reconstruction technique. CONTRAST:  42m OMNIPAQUE IOHEXOL 350 MG/ML SOLN COMPARISON:  05/21/2020 FINDINGS: Lower chest: Trace bilateral pleural effusions. Bibasilar atelectasis. Hepatobiliary: Cirrhosis without focal hepatic mass. No gallstones, gallbladder wall thickening, or biliary dilatation. Pancreas: Unremarkable. No pancreatic ductal dilatation or surrounding inflammatory changes. Spleen: Normal in size without focal abnormality. Adrenals/Urinary Tract: Adrenal glands are unremarkable. Stable bilateral renal cysts for which no further evaluation is recommended. Severe right hydronephrosis. Right nephrolithiasis with the largest in the renal pelvis measuring 3.6 cm. 4 mm right mid ureteral calculus. 7 mm right distal ureteral calculus. Decompressed bladder. Foley catheter in the bladder. Stomach/Bowel: No bowel dilatation to suggest bowel obstruction. Bowel wall thickening of the rectosigmoid colon consistent with proctocolitis. 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 16 mm low-density area in the rectal wall along the left lateral aspect likely reflecting a small intramural abscess. Vascular/Lymphatic: Normal caliber abdominal aorta with mild atherosclerosis. No lymphadenopathy. Reproductive: Status post hysterectomy. No adnexal masses. Other: No abdominal wall hernia or abnormality. No abdominopelvic ascites. Sacral decubitus ulcer. Decubitus ulcer overlying the right ischial tuberosity. Generalized osteopenia. Mild osteoarthritis of bilateral SI joints. Mild osteoarthritis of the right hip. Musculoskeletal: No acute osseous abnormality. No aggressive osseous lesion. Diffuse thoracolumbar spondylosis. 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 IMPRESSION: 1. Bowel wall thickening of  the rectosigmoid colon consistent with proctocolitis. 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 16 mm low-density area in the rectal wall along the left lateral aspect likely reflecting a small intramural abscess. 2. Severe right hydronephrosis. Right nephrolithiasis with the largest in the renal pelvis measuring 3.6 cm. 4 mm right mid ureteral calculus. 7 mm right distal ureteral calculus. 3. Decubitus ulcer overlying the right ischial tuberosity. Electronically Signed   By: HKathreen DevoidM.D.   On: 12/23/2021 14:13   DG Chest Port 1 View  Result Date: 12/23/2021 CLINICAL DATA:  Respiratory failure EXAM:  PORTABLE CHEST 1 VIEW COMPARISON:  Radiograph 12/22/2021 FINDINGS: Left neck approach catheter tip overlies the proximal SVC. Unchanged cardiomediastinal silhouette with unchanged tortuosity of the aorta. New bandlike left basilar opacity, likely atelectasis. No large effusion. No evidence of pneumothorax. Bones are unchanged. IMPRESSION: New bandlike left basilar opacity, favored to be atelectasis. Continued radiographic follow-up recommended. Electronically Signed   By: Maurine Simmering M.D.   On: 12/23/2021 08:07   DG CHEST PORT 1 VIEW  Result Date: 12/22/2021 CLINICAL DATA:  Central line EXAM: PORTABLE CHEST 1 VIEW COMPARISON:  Chest x-ray 12/22/2021 FINDINGS: There is a new left-sided central venous catheter with distal tip projecting over the brachiocephalic SVC junction. The aorta is tortuous, unchanged. Heart size is within normal limits. There is no focal lung infiltrate, pleural effusion or pneumothorax. No acute fractures are seen. IMPRESSION: New left-sided central venous catheter with distal tip projecting over the brachiocephalic SVC junction. No pneumothorax. Electronically Signed   By: Ronney Asters M.D.   On: 12/22/2021 22:49   DG Chest Port 1 View  Result Date: 12/22/2021 CLINICAL DATA:  Altered mental status and cough. EXAM: PORTABLE CHEST  1 VIEW COMPARISON:  Chest radiograph 11/15/2021 FINDINGS: Patient appears to be slightly rotated towards the right. Heart and mediastinum are within normal limits and stable. Slightly coarse lung markings are suggestive for chronic changes. No focal airspace disease or pulmonary edema. Mild widening at the bilateral AC joints. Negative for a pneumothorax. IMPRESSION: No acute chest findings. Electronically Signed   By: Markus Daft M.D.   On: 12/22/2021 13:29    Microbiology: Results for orders placed or performed during the hospital encounter of 12/22/21  SARS Coronavirus 2 by RT PCR (hospital order, performed in [facility] hospital lab) *cepheid single result test* Anterior Nasal Swab     Status: None   Collection Time: 12/22/21  1:40 PM   Specimen: Anterior Nasal Swab  Result Value Ref Range Status   SARS Coronavirus 2 by RT PCR NEGATIVE NEGATIVE Final    Comment: (NOTE) SARS-CoV-2 target nucleic acids are NOT DETECTED.  The SARS-CoV-2 RNA is generally detectable in upper and lower respiratory specimens during the acute phase of infection. The lowest concentration of SARS-CoV-2 viral copies this assay can detect is 250 copies / mL. A negative result does not preclude SARS-CoV-2 infection and should not be used as the sole basis for treatment or other patient management decisions.  A negative result may occur with improper specimen collection / handling, submission of specimen other than nasopharyngeal swab, presence of viral mutation(s) within the areas targeted by this assay, and inadequate number of viral copies (<250 copies / mL). A negative result must be combined with clinical observations, patient history, and epidemiological information.  Fact Sheet for Patients:   https://www.patel.info/  Fact Sheet for Healthcare Providers: https://hall.com/  This test is not yet approved or  cleared by the [place] FDA and has been authorized  for detection and/or diagnosis of SARS-CoV-2 by FDA under an Emergency Use Authorization (EUA).  This EUA will remain in effect (meaning this test can be used) for the duration of the COVID-19 declaration under Section 564(b)(1) of the Act, 21 U.S.C. section 360bbb-3(b)(1), unless the authorization is terminated or revoked sooner.  Performed at [facility], [address], [place], [place] [address]   Blood culture (routine x 2)     Status: None   Collection Time: 12/22/21  2:49 PM   Specimen: Right Antecubital; Blood  Result Value Ref Range Status  Specimen Description RIGHT ANTECUBITAL  Final   Special Requests   Final    BOTTLES DRAWN AEROBIC AND ANAEROBIC Blood Culture results may not be optimal due to an inadequate volume of blood received in culture bottles   Culture   Final    NO GROWTH 5 DAYS Performed at [facility], [address], [place], [place] [address]    Report Status 12/27/2021 FINAL  Final  Blood culture (routine x 2)     Status: None   Collection Time: 12/22/21  2:49 PM   Specimen: BLOOD RIGHT WRIST  Result Value Ref Range Status   Specimen Description BLOOD RIGHT WRIST  Final   Special Requests   Final    BOTTLES DRAWN AEROBIC AND ANAEROBIC Blood Culture adequate volume   Culture   Final    NO GROWTH 5 DAYS Performed at [facility], [address] [place], [place] [address]    Report Status 12/27/2021 FINAL  Final  Urine Culture     Status: Abnormal   Collection Time: 12/22/21  3:40 PM   Specimen: Urine, Random  Result Value Ref Range Status   Specimen Description   Final    URINE, RANDOM Performed at [facility], [address], [place], [place] [address]    Special Requests   Final    NONE Performed at [facility], [address], [place], [place] [address]    Culture MULTIPLE SPECIES PRESENT, SUGGEST RECOLLECTION (A)  Final   Report Status 12/23/2021 FINAL  Final  MRSA Next Gen by PCR, Nasal     Status: None   Collection Time: 12/22/21  7:43  PM   Specimen: Nasal Mucosa; Nasal Swab  Result Value Ref Range Status   MRSA by PCR Next Gen NOT DETECTED NOT DETECTED Final    Comment: (NOTE) The GeneXpert MRSA Assay (FDA approved for NASAL specimens only), is one component of a comprehensive MRSA colonization surveillance program. It is not intended to diagnose MRSA infection nor to guide or monitor treatment for MRSA infections. Test performance is not FDA approved in patients less than 69 years old. Performed at [facility], [place] [address] [place], [place] [address]   C Difficile Quick Screen w PCR reflex     Status: None   Collection Time: 12/23/21  7:26 PM   Specimen: STOOL  Result Value Ref Range Status   C Diff antigen NEGATIVE NEGATIVE Final   C Diff toxin NEGATIVE NEGATIVE Final   C Diff interpretation No C. difficile detected.  Final    Comment: Performed at [facility], [place] [address], [place], [place] [address]  Aerobic/Anaerobic Culture w Gram Stain (surgical/deep wound)     Status: None (Preliminary result)   Collection Time: 12/24/21  9:01 AM   Specimen: Other Source; Urine  Result Value Ref Range Status   Specimen Description OTHER  Final   Special Requests KIDNEY RIGHT  Final   Gram Stain   Final    ABUNDANT WBC PRESENT, PREDOMINANTLY PMN FEW GRAM POSITIVE COCCI IN CHAINS IN PAIRS FEW GRAM NEGATIVE RODS    Culture   Final    ABUNDANT MORGANELLA MORGANII ABUNDANT PROTEUS MIRABILIS ABUNDANT ENTEROCOCCUS FAECALIS Sent to [facility] for further susceptibility testing. ABUNDANT BACTEROIDES FRAGILIS BETA LACTAMASE NEGATIVE Performed at [facility], [place] [address], [place], [place] [address]    Report Status PENDING  Incomplete   Organism ID, Bacteria MORGANELLA MORGANII  Final   Organism ID, Bacteria PROTEUS MIRABILIS  Final  Susceptibility   Morganella morganii - MIC*    AMPICILLIN >=32 RESISTANT Resistant     CEFAZOLIN RESISTANT Resistant     CEFTAZIDIME <=1 SENSITIVE Sensitive      CIPROFLOXACIN >=4 RESISTANT Resistant     GENTAMICIN <=1 SENSITIVE Sensitive     IMIPENEM 1 SENSITIVE Sensitive     TRIMETH/SULFA >=320 RESISTANT Resistant     AMPICILLIN/SULBACTAM >=32 RESISTANT Resistant     PIP/TAZO <=4 SENSITIVE Sensitive     * ABUNDANT MORGANELLA MORGANII   Proteus mirabilis - MIC*    AMPICILLIN >=32 RESISTANT Resistant     CEFAZOLIN >=64 RESISTANT Resistant     CEFEPIME <=0.12 SENSITIVE Sensitive     CEFTAZIDIME 2 SENSITIVE Sensitive     CEFTRIAXONE 1 SENSITIVE Sensitive     CIPROFLOXACIN >=4 RESISTANT Resistant     GENTAMICIN <=1 SENSITIVE Sensitive     IMIPENEM 2 SENSITIVE Sensitive     TRIMETH/SULFA >=320 RESISTANT Resistant     AMPICILLIN/SULBACTAM >=32 RESISTANT Resistant     PIP/TAZO <=4 SENSITIVE Sensitive     * ABUNDANT PROTEUS MIRABILIS    Labs: CBC: Recent Labs  Lab 12/22/21 1342 12/22/21 2123 12/24/21 0244 12/25/21 0541 12/25/21 1831 12/26/21 0213 12/27/21 0459 12/28/21 0303 12/29/21 0424  WBC 35.7*   < > 18.5*   < > 10.2 8.1 8.0 11.0* 9.2  NEUTROABS 31.2*  --  15.7*  --   --   --   --   --  5.7  HGB 7.1*   < > 7.2*   < > 8.4* 7.6* 7.1* 8.3* 8.1*  HCT 23.8*   < > 23.7*   < > 26.5* 23.9* 23.2* 26.6* 26.5*  MCV 89.8   < > 86.8   < > 86.0 86.3 87.2 88.1 88.0  PLT 470*   < > 444*   < > 299 269 252 265 266   < > = values in this interval not displayed.   Basic Metabolic Panel: Recent Labs  Lab 12/23/21 0310 12/24/21 0244 12/25/21 0541 12/26/21 0213 12/27/21 0459 12/28/21 0303 12/29/21 0424  NA 133*   < > 132* 134* 137 135 134*  K 4.6   < > 3.8 3.5 3.0* 4.6 4.5  CL 106   < > 108 108 113* 108 106  CO2 12*   < > 19* 20* 19* 19* 20*  GLUCOSE 136*   < > 84 89 90 77 82  BUN 64*   < > _0 CREATININE 1.46*   < > 0.54 0.51 0.49 0.53 0.63  CALCIUM 8.3*   < > 7.7* 7.8* 6.7* 8.1* 8.3*  MG 2.4  --  1.7 1.8 1.7 1.6* 1.8  PHOS 4.4  --  2.0* 2.3* 2.6 2.5  --    < > = values in this interval not displayed.   Liver Function  Tests: Recent Labs  Lab 12/22/21 1342  AST 29  ALT 21  ALKPHOS 146*  BILITOT 1.0  PROT 6.8  ALBUMIN 2.2*   CBG: Recent Labs  Lab 12/25/21 1109 12/25/21 1529 12/27/21 0736 12/27/21 1114 12/27/21 1512  GLUCAP 99 95 98 116* 94    Discharge time spent: greater than 30 minutes.  Signed: Estill Cotta, MD [facility] 12/29/2021

## 2021-12-29 NOTE — Progress Notes (Signed)
I have attempted to call report 2x to [facility] at [phone]. ( 1430pm and 1450pm) I was transferred both times to the unit by the operator but no one answered the phone.  I have given my number to the receptionist for the nurse to call me back for report.

## 2021-12-29 NOTE — Progress Notes (Signed)
Tried calling report to facility with no one answering the phone.

## 2021-12-29 NOTE — Progress Notes (Signed)
PHARMACY CONSULT NOTE FOR:  OUTPATIENT  PARENTERAL ANTIBIOTIC THERAPY (OPAT)  Indication: UTI w/ obstructive renal calculi with severe hydronephrosis  Regimen: pip/tazo 3.375g extended infusion q8 hr End date: 01/03/22  IV antibiotic discharge orders are pended. To discharging provider:  please sign these orders via discharge navigator,  Select New Orders & click on the button choice - Manage This Unsigned Work.     Thank you for allowing pharmacy to be a part of this patient's care.  Benetta Spar, PharmD, BCPS, BCCP Clinical Pharmacist  Please check AMION for all [place] phone numbers After 10:00 PM, call [place] [phone]

## 2021-12-29 NOTE — NC FL2 (Signed)
[place] LEVEL OF CARE SCREENING TOOL     IDENTIFICATION  Patient Name: [name] Birthdate: 22-Jan-1946 Sex: female Admission Date (Current Location): 12/22/2021  [facility] and [place] Number:  Herbalist and Address:  [facility] [place], [place] [address] [place], [place] [address]      Provider Number: [id number]  Attending Physician Name and Address:  Mendel Corning, MD  Relative Name and Phone Number:  Carmie End (Daughter)   [phone]    Current Level of Care: Hospital Recommended Level of Care: [place] Prior Approval Number:    Date Approved/Denied:   PASRR Number: [id number]  Discharge Plan: SNF    Current Diagnoses: Patient Active Problem List   Diagnosis Date Noted   Hydronephrosis 12/27/2021   Septic shock ([place]) 12/22/2021   Hyperkalemia    Acute metabolic encephalopathy 01/74/9449   Pressure injury of skin 11/15/2021   Neurogenic bladder 02/23/2021   Urinary incontinence 02/23/2021   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   Constipation 06/05/2020   Hypokalemia 04/04/2016   Hyperglycemia 04/04/2016   Cellulitis 04/03/2016   Sepsis ([place]) 09/08/2014   Chronic paraplegia ([place]) 09/08/2014   Swelling of right lower extremity 07/21/2014   Discitis of lumbar region    Abscess in epidural space of lumbar spine    Blood poisoning    Osteomyelitis ([place])    Discitis of thoracolumbar region    Anxiety state    Chronic pain syndrome    Sepsis due to Candida species ([place])    Spondylarthrosis    AKI (acute kidney injury) ([place])    Other specified hypothyroidism    Essential hypertension    Paraplegia ([place])    Enteritis due to Clostridium difficile 05/08/2014   UTI (urinary tract infection) 05/08/2014   Chronic indwelling Foley catheter 05/08/2014   Hypotension    Decubitus ulcer of sacral region, unstageable ([place]) 03/28/2014   Hypothyroid 03/28/2014   Depression 03/28/2014   ARF (acute renal  failure) ([place]) 03/28/2014   Acute respiratory failure with hypoxia ([place]) 03/27/2014   Pyelonephritis 10/22/2013   Leukocytosis 10/22/2013   Hyponatremia 10/22/2013   Encephalopathy acute 10/22/2013   Hypothyroidism 10/22/2013   Hypertension    Bipolar 1 disorder (HCC)    Thyroid disease    Diabetes mellitus without complication (HCC)    GERD (gastroesophageal reflux disease)    SHOULDER PAIN 11/19/2007   HAND, ARTHRITIS, DEGEN./OSTEO 09/03/2007   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 Thigh=unstageable; Ischial tuberosity=stage 4; Buttocks R= deep tissue; L Buttocks= skin damage, R. Vagina= Skin tear; Anus= Skin tear)                       Personal Care Assistance Level of Assistance  Bathing, Feeding, Total care, Dressing Bathing Assistance: Maximum assistance Feeding assistance: Maximum assistance Dressing Assistance: Maximum assistance Total Care Assistance: Maximum assistance   Functional Limitations Info  Sight, Hearing, Speech Sight Info: Adequate Hearing Info: Adequate Speech Info: Adequate    SPECIAL CARE FACTORS FREQUENCY  Contractures Contractures Info: Not present    Additional Factors Info  Psychotropic, Allergies, Code Status Code Status Info: Full Allergies Info: Invanz (Ertapenem)   Propranolol Hcl   Rocephin (Ceftriaxone)   Vancomycin Psychotropic Info: Welbutrin         Current Medications (12/29/2021):  This is the current hospital active medication list Current Facility-Administered Medications  Medication Dose  Route Frequency Provider Last Rate Last Admin   0.9 %  sodium chloride infusion  250 mL Intravenous Continuous Omar Person, NP   Stopped at 12/22/21 2250   acetaminophen (TYLENOL) tablet 650 mg  650 mg Oral Q6H PRN Omar Person, NP   650 mg at 12/28/21 2132   aspirin chewable tablet 81 mg  81 mg Oral Daily Omar Person, NP   81 mg at 12/29/21 0914   atorvastatin (LIPITOR) tablet 10 mg  10 mg Oral Daily Omar Person, NP   10 mg at 12/29/21 0914   buPROPion (WELLBUTRIN XL) 24 hr tablet 300 mg  300 mg Oral Daily Hayden Pedro M, NP   300 mg at 12/29/21 9826   Chlorhexidine Gluconate Cloth 2 % PADS 6 each  6 each Topical Daily Omar Person, NP   6 each at 12/29/21 0915   docusate sodium (COLACE) capsule 100 mg  100 mg Oral BID PRN Omar Person, NP       ezetimibe (ZETIA) tablet 10 mg  10 mg Oral QHS Hayden Pedro M, NP   10 mg at 12/28/21 2132   famotidine (PEPCID) tablet 20 mg  20 mg Oral BID Omar Person, NP   20 mg at 12/29/21 0914   gabapentin (NEURONTIN) capsule 300 mg  300 mg Oral Q8H Omar Person, NP   300 mg at 12/29/21 1236   heparin injection 5,000 Units  5,000 Units Subcutaneous Q8H Omar Person, NP   5,000 Units at 12/29/21 0531   ipratropium-albuterol (DUONEB) 0.5-2.5 (3) MG/3ML nebulizer solution 3 mL  3 mL Nebulization Q6H PRN Omar Person, NP       levothyroxine (SYNTHROID) tablet 175 mcg  175 mcg Oral Q0600 Omar Person, NP   175 mcg at 12/29/21 0531   lip balm (CARMEX) ointment   Topical PRN Omar Person, NP       midodrine (PROAMATINE) tablet 5 mg  5 mg Oral TID WC [name], MD   5 mg at 12/29/21 1236   Oral care mouth rinse  15 mL Mouth Rinse 4 times per day Omar Person, NP   15 mL at 12/29/21 1237   Oral care mouth rinse  15 mL Mouth Rinse PRN Omar Person, NP       piperacillin-tazobactam (ZOSYN) IVPB 3.375 g  3.375 g Intravenous Q8H Vu, Trung T, MD 12.5 mL/hr at  12/29/21 0531 3.375 g at 12/29/21 0531   polyethylene glycol (MIRALAX / GLYCOLAX) packet 17 g  17 g Oral Daily PRN Omar Person, NP       sodium chloride flush (NS) 0.9 % injection 10-40 mL  10-40 mL Intracatheter Q12H Omar Person, NP   10 mL at 12/29/21 0915     Discharge Medications: Please see discharge summary for a list of discharge medications.  Relevant Imaging Results:  Relevant Lab Results:   Additional Information SSN: [id number];  PICC line receiving Zosyn  Lynze Reddy F Keyshawn Hellwig, LCSWA

## 2021-12-30 ENCOUNTER — Encounter (HOSPITAL_COMMUNITY): Payer: Self-pay | Admitting: Internal Medicine

## 2021-12-30 NOTE — Progress Notes (Addendum)
Called receiving facility for report.x4 after patient left the unit. I have called after shift change.No one is answering the phone. Patient has been given discharge information. Patient received a bath and dressing changes prior to discharging. Patient transport has patient's belongings. Family has been notified.

## 2022-01-02 LAB — SUSCEPTIBILITY, AER + ANAEROB

## 2022-01-02 LAB — SUSCEPTIBILITY RESULT

## 2022-01-12 LAB — AEROBIC/ANAEROBIC CULTURE W GRAM STAIN (SURGICAL/DEEP WOUND)

## 2022-01-13 ENCOUNTER — Encounter: Payer: Self-pay | Admitting: Urology

## 2022-01-13 ENCOUNTER — Ambulatory Visit (INDEPENDENT_AMBULATORY_CARE_PROVIDER_SITE_OTHER): Payer: Medicare Other | Admitting: Urology

## 2022-01-13 VITALS — BP 125/65 | HR 103 | Ht 67.0 in | Wt 240.0 lb

## 2022-01-13 DIAGNOSIS — N200 Calculus of kidney: Secondary | ICD-10-CM

## 2022-01-13 DIAGNOSIS — Z87440 Personal history of urinary (tract) infections: Secondary | ICD-10-CM | POA: Diagnosis not present

## 2022-01-13 DIAGNOSIS — N319 Neuromuscular dysfunction of bladder, unspecified: Secondary | ICD-10-CM

## 2022-01-13 DIAGNOSIS — N202 Calculus of kidney with calculus of ureter: Secondary | ICD-10-CM | POA: Diagnosis not present

## 2022-01-13 DIAGNOSIS — N201 Calculus of ureter: Secondary | ICD-10-CM | POA: Insufficient documentation

## 2022-01-13 NOTE — Progress Notes (Signed)
Assessment: 1. Ureteral calculi   2. Nephrolithiasis   3. History of UTI   4. Neurogenic bladder     Plan: I reviewed the records from the patient's recent hospitalization including provider notes, discharge summary, lab results, and imaging results. I personally viewed the CT study from 12/23/2021 showing severe right hydronephrosis, right nephrolithiasis with a 3.6 cm calculus in the right renal pelvis, 4 mm right mid ureteral calculus, and 7 mm distal right ureteral calculus. Recommend continuing the right nephrostomy tube at this time. Repeat CT renal stone study to evaluate for the right ureteral calculi. Consider surgical management of the right ureteral calculi with ureteroscopic laser lithotripsy.  Management of the large right renal calculus would be more extensive and likely require percutaneous nephrolithotomy.  She would require medical clearance to determine if she is medically stable to tolerate a procedure of this magnitude.  Additionally, the right renal stone has been present for some time and appears to be stable. Return to office in 1-2 weeks to review results and discuss management further.  Chief Complaint:  Chief Complaint  Patient presents with   Urinary Tract Infection   ureteral calculus    History of Present Illness:  [name] is a 76 y.o. year old female who is seen for evaluation of right renal and ureteral calculi and recent hospital admission for sepsis.  She was previously seen in November 2022 for a neurogenic bladder, possible SP tube placement, and nephrolithiasis.  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  Her Foley catheter is changed approximately every month or as needed.  She has had continued incontinence around the catheter.  She has been on  Myrbetriq 25 mg twice daily for a number of years.  She has not had significant problems with urinary tract infections.  She is essentially bedbound.  CT imaging from 2/22 showed bilateral nephrolithiasis with a 3.7 cm stone in the left upper pole without obstruction.  She has a remote history of a ureteral calculus requiring ureteroscopy.   She was admitted to the hospital on 12/23/2021 with sepsis.  She was found to have severe right hydronephrosis with a 4 mm mid right ureteral stone and a 7 mm right distal ureteral stone.  She also had the large right renal stone measuring 3.6 cm which was stable as compared to prior studies.  She underwent placement of a right nephrostomy tube on 12/24/2021. She was treated with IV Zosyn which was scheduled to complete on 01/03/2022.  According to her records from [facility], she is currently on Levaquin 750 mg daily x10 days (started on 01/05/2022). Her right nephrostomy tube has been draining well.  She continues with a Foley catheter secondary to her neurogenic bladder.  Portions of the above documentation were copied from a prior visit for review purposes only.   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[place])    Pyelonephritis    Thyroid disease     Past Surgical History:  Past Surgical History:  Procedure Laterality Date   ABDOMINAL HYSTERECTOMY  BACK SURGERY     IR NEPHROSTOMY PLACEMENT RIGHT  12/24/2021    Allergies:  Allergies  Allergen Reactions   Invanz [Ertapenem] Other (See Comments)    Patient states allergy but is not certain    Propranolol Hcl    Rocephin [Ceftriaxone] Hives   Vancomycin Hives    Family History:  Family History  Adopted: Yes  Problem Relation Age of Onset   Heart disease Mother      Social History:  Social History   Tobacco Use   Smoking status: Former   Smokeless tobacco: Never  Substance Use Topics   Alcohol use: Not Currently    Alcohol/week: 0.0 standard drinks of alcohol    Comment: "weekend drinker, Saturday night drunk" years ago   Drug use: No    ROS: Constitutional:  Negative for fever, chills, weight loss CV: Negative for chest pain, previous MI, hypertension Respiratory:  Negative for shortness of breath, wheezing, sleep apnea, frequent cough GI:  Negative for nausea, vomiting, bloody stool, GERD  Physical exam: BP 125/65   Pulse (!) 103   Ht '5\' 7"'$  (1.702 m)   Wt 240 lb (108.9 kg)   BMI 37.59 kg/m  GENERAL APPEARANCE:  Chronically ill appearing female on stretcher, NAD HEENT:  Atraumatic, normocephalic, oropharynx clear NECK:  Supple without lymphadenopathy or thyromegaly ABDOMEN:  Soft, non-tender, no masses EXTREMITIES:  Moves all extremities well, without clubbing, cyanosis, or edema NEUROLOGIC:  Alert and oriented x 3, normal gait, CN II-XII grossly intact MENTAL STATUS:  appropriate BACK:  Non-tender to palpation, No CVAT; right nephrostomy tube draining clear urine SKIN:  Warm, dry, and intact GU:  foley draining clear urine  Results: None

## 2022-01-25 ENCOUNTER — Other Ambulatory Visit: Payer: Self-pay

## 2022-01-26 ENCOUNTER — Ambulatory Visit: Payer: Medicare Other | Admitting: Infectious Diseases

## 2022-01-26 ENCOUNTER — Encounter (HOSPITAL_COMMUNITY)
Admission: RE | Admit: 2022-01-26 | Discharge: 2022-01-26 | Disposition: A | Discharge status: Home / Self Care | Payer: Medicare Other | Source: Ambulatory Visit | Attending: Emergency Medicine | Admitting: Emergency Medicine

## 2022-01-26 VITALS — BP 117/56 | HR 85 | Temp 98.5°F | Resp 16

## 2022-01-26 DIAGNOSIS — D500 Iron deficiency anemia secondary to blood loss (chronic): Secondary | ICD-10-CM | POA: Insufficient documentation

## 2022-01-26 LAB — PREPARE RBC (CROSSMATCH)

## 2022-01-26 MED ORDER — SODIUM CHLORIDE 0.9% IV SOLUTION: Freq: Once | INTRAVENOUS | Status: AC

## 2022-01-26 NOTE — Progress Notes (Addendum)
Diagnosis: Acute Anemia  Provider:  Marshell Garfinkel MD  Procedure: Infusion  IV Type: Peripheral, IV Location: R Antecubital  PRBCs, Dose: 2 units  Blood Transfusion Record     Product Unit Status Volume Start End          Transfuse RBC    [id number]  23  [id number]  [id number] Completed 01/26/22 1659 484.17 mL 01/26/22 1451 01/26/22 1655    [id number]  23  [id number]  [id number] Completed 01/26/22 1719 441.5 mL 01/26/22 1245 01/26/22 1445          Post Infusion IV Care: Observation period completed and Peripheral IV Discontinued  Discharge: Condition: Good, Destination: Skilled nursing facility . AVS provided to patient.   Performed by:  Jonelle Sidle, RN    EMS called for transport at [phone] - informed would be ready around 1700 - no ETA available.   Patient received lunch and dinner trays from dietary department.   Patient picked up by [place] for transport back to facility at [facility]

## 2022-01-28 LAB — TYPE AND SCREEN
ABO/RH(D): O POS
Antibody Screen: NEGATIVE
Unit division: 0
Unit division: 0

## 2022-01-28 LAB — BPAM RBC
Blood Product Expiration Date: 202312052359
Blood Product Expiration Date: 202312052359
ISSUE DATE / TIME: 202311011245
ISSUE DATE / TIME: 202311011444
Unit Type and Rh: 5100
Unit Type and Rh: 5100

## 2022-01-31 ENCOUNTER — Ambulatory Visit (HOSPITAL_COMMUNITY)
Admission: RE | Admit: 2022-01-31 | Discharge: 2022-01-31 | Disposition: A | Discharge status: Home / Self Care | Payer: Medicare Other | Source: Ambulatory Visit | Attending: Urology | Admitting: Urology

## 2022-01-31 DIAGNOSIS — N201 Calculus of ureter: Secondary | ICD-10-CM | POA: Diagnosis present

## 2022-01-31 DIAGNOSIS — N200 Calculus of kidney: Secondary | ICD-10-CM | POA: Insufficient documentation

## 2022-02-01 ENCOUNTER — Ambulatory Visit (INDEPENDENT_AMBULATORY_CARE_PROVIDER_SITE_OTHER): Payer: Medicare Other | Admitting: Internal Medicine

## 2022-02-01 ENCOUNTER — Encounter: Payer: Self-pay | Admitting: Internal Medicine

## 2022-02-01 ENCOUNTER — Other Ambulatory Visit: Payer: Self-pay

## 2022-02-01 VITALS — BP 152/84 | HR 70 | Temp 97.9°F | Wt 240.0 lb

## 2022-02-01 DIAGNOSIS — L89319 Pressure ulcer of right buttock, unspecified stage: Secondary | ICD-10-CM | POA: Diagnosis not present

## 2022-02-01 DIAGNOSIS — L89150 Pressure ulcer of sacral region, unstageable: Secondary | ICD-10-CM

## 2022-02-01 NOTE — Progress Notes (Unsigned)
[facility]      Reason for Consult:"wound infection"    Referring Physician: Dr. Mal Amabile    Patient ID: [name], female    DOB: 08/01/1945, 76 y.o.   MRN: [id number]  HPI:   She is here for evaluation of her pressure ulcer.  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  She is in [place].  She was recently hospitalized for infectious complications of nephrolithiasis and ureteral calculi and right nephrostomy tube placement and followed by Dr. Felipa Eth.  She was sent to me today for "wound infection".  There are no notes related to her wound from the facility or ordering provider and the only note sent is the discharge summary regarding her recent urinary infection.  She is getting wound care at the facility and the patient reports it is going well and no concerns have been expressed to her.     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 Authorizing Provider  acetaminophen (TYLENOL) 325 MG tablet Take 1 tablet (325 mg total) by mouth every 6 (six) hours as needed for mild pain, moderate pain, fever or headache. *May take one every 6 hours as needed for pain 12/29/21  Yes Rai, Ripudeep K, MD  Amino Acids-Protein Hydrolys (FEEDING SUPPLEMENT, PRO-STAT 64,) LIQD Take 30 mLs by mouth 3 (three) times daily with meals.   Yes [provider]  aspirin EC 81 MG tablet Take 81 mg by mouth daily.   Yes [provider]  atorvastatin (LIPITOR) 10 MG tablet Take 10 mg by mouth daily.   Yes [provider]  azelastine (ASTELIN) 0.1 % nasal spray Place 2 sprays into both  nostrils 2 (two) times daily. Use in each nostril as directed   Yes [provider]  b complex vitamins tablet Take 1 tablet by mouth daily.   Yes [provider]  buPROPion (WELLBUTRIN XL) 300 MG 24 hr tablet Take 300 mg by mouth daily.    Yes [provider]  Calcium Carb-Cholecalciferol (CALCIUM CARBONATE-VITAMIN D3 PO) Take 1 tablet by mouth in the morning and at bedtime.   Yes [provider]  Cranberry 500 MG TABS Take 1 capsule by mouth daily.   Yes [provider]  docusate sodium (COLACE) 100 MG capsule Take 100 mg by mouth at bedtime.   Yes [provider]  ezetimibe (ZETIA) 10 MG tablet Take 10 mg by mouth at bedtime.    Yes [provider]  famotidine (PEPCID) 20 MG tablet Take 20 mg by mouth 2 (two) times daily.   Yes [provider]  ferrous sulfate 325 (65 FE) MG EC tablet Take 325 mg by mouth 3 (three) times daily with meals.   Yes [provider]  fluticasone (FLONASE) 50 MCG/ACT nasal spray Place 2 sprays into both nostrils daily.  09/03/14  Yes [provider]  gabapentin (NEURONTIN) 300 MG capsule Take 300 mg by mouth every 8 (eight) hours.   Yes [provider]  ipratropium-albuterol (DUONEB) 0.5-2.5 (3) MG/3ML  SOLN Take 3 mLs by nebulization every 6 (six) hours as needed (shortness of breath). 11/19/21  Yes Roxan Hockey, MD  Javier Docker Oil 300 MG CAPS Take 1 capsule by mouth 2 (two) times daily.   Yes [provider]  Lactobacillus (ACIDOPHILUS PO) Take 1 tablet by mouth in the morning and at bedtime.   Yes [provider]  levothyroxine (SYNTHROID) 150 MCG tablet Take 175 mcg by mouth daily.   Yes [provider]  magnesium oxide (MAG-OX) 400 MG tablet Take 400 mg by mouth daily.   Yes [provider]  Menthol-Zinc Oxide (CALMOSEPTINE) 0.44-20.6 % OINT Apply 1 Application topically every 8 (eight) hours as needed (protection/prevention).   Yes  [provider]  midodrine (PROAMATINE) 5 MG tablet Take 1 tablet (5 mg total) by mouth 3 (three) times daily with meals. 12/29/21  Yes Rai, Ripudeep K, MD  mirabegron ER (MYRBETRIQ) 25 MG TB24 tablet Take 25 mg by mouth daily.   Yes [provider]  Multiple Vitamins-Minerals (CERTAGEN PO) Take 1 tablet by mouth daily.   Yes [provider]  Nutritional Supplements (ARGINAID) PACK Take 1 packet by mouth daily.   Yes [provider]  pantoprazole (PROTONIX) 40 MG tablet Take 1 tablet (40 mg total) by mouth daily. 11/20/21  Yes Emokpae, Courage, MD  polyethylene glycol (MIRALAX / GLYCOLAX) 17 g packet Take 17 g by mouth daily as needed for moderate constipation. 12/29/21  Yes Rai, Ripudeep K, MD  potassium chloride 20 MEQ TBCR Take 10 mEq by mouth daily. 04/06/16  Yes Kathie Dike, MD  SANTYL 250 UNIT/GM ointment Apply 1 Application topically daily. 12/22/21  Yes [provider]  tamsulosin (FLOMAX) 0.4 MG CAPS capsule Take 1 capsule (0.4 mg total) by mouth daily after supper. 11/19/21  Yes Emokpae, Courage, MD  vitamin C (ASCORBIC ACID) 500 MG tablet Take 500 mg by mouth 2 (two) times daily.   Yes [provider]  Vitamin D, Ergocalciferol, (DRISDOL) 1.25 MG (50000 UNIT) CAPS capsule Take 50,000 Units by mouth every 7 (seven) days.   Yes [provider]  Wound Dressings (PROMOGRAN EX) Apply topically daily.   Yes [provider]  dextromethorphan-guaiFENesin (MUCINEX DM) 30-600 MG per 12 hr tablet Take 1 tablet by mouth every 12 (twelve) hours as needed for cough.  Patient not taking: Reported on 02/01/2022    [provider]  piperacillin-tazobactam (ZOSYN) IVPB Inject 3.375 g into the vein every 8 (eight) hours. **Run Piperacillin/tazobactam 3.375g over 4 hours Extended infusion** Indication:  UTI w/ obstructive renal calculi with severe hydronephrosis  First Dose: Yes Last Day of Therapy:  01/03/22 Labs - Once weekly:   CBC/D and BMP, Labs - Every other week:  ESR and CRP Method of administration: Elastomeric (Continuous infusion) Method of administration may be changed at the discretion of home infusion pharmacist based upon assessment of the patient and/or caregiver's ability to self-administer the medication ordered. Patient not taking: Reported on 02/01/2022 12/29/21   Mendel Corning, MD    Allergies  Allergen Reactions   Invanz [Ertapenem] Other (See Comments)    Patient states allergy but is not certain    Propranolol Hcl    Rocephin [Ceftriaxone] Hives   Vancomycin Hives    Social History   Tobacco Use   Smoking status: Former   Smokeless tobacco: Never  Substance Use Topics   Alcohol use: Not Currently    Alcohol/week: 0.0 standard drinks of alcohol    Comment: "weekend drinker,  Saturday night drunk" years ago   Drug use: No    Family History  Adopted: Yes  Problem Relation Age of Onset   Heart disease Mother     Review of Systems  Constitutional: negative for fevers and chills All other systems reviewed and are negative    Constitutional: alert Vitals:   02/01/22 1421  BP: (!) 152/84  Pulse: 70  Temp: 97.9 F (36.6 C)  SpO2: 100%   EYES: anicteric Back: wound examined, some serosanguinous drainage noted; some stool present; no surrounding erythema, no pus  Labs: Lab Results  Component Value Date   WBC 9.2 12/29/2021   HGB 8.1 (L) 12/29/2021   HCT 26.5 (L) 12/29/2021   MCV 88.0 12/29/2021   PLT 266 12/29/2021    Lab Results  Component Value Date   CREATININE 0.63 12/29/2021   BUN 13 12/29/2021   NA 134 (L) 12/29/2021   K 4.5 12/29/2021   CL 106 12/29/2021   CO2 20 (L) 12/29/2021    Lab Results  Component Value Date   ALT 21 12/22/2021   AST 29 12/22/2021   ALKPHOS 146 (H) 12/22/2021   BILITOT 1.0 12/22/2021   INR 1.4 (H) 12/24/2021     Assessment: pressure ulcer without signs of active infection.  I do not appreciate any particular infection at this  time and no antibiotics indicated.     Plan: 1)  continue with wound care, nutrition, offloading  Follow up as needed  I have personally spent 65 minutes involved in face-to-face and non-face-to-face activities for this patient on the day of the visit. Professional time spent includes the following activities: Preparing to see the patient (review of tests), Obtaining and/or reviewing separately obtained history (admission/discharge record), Performing a medically appropriate examination and/or evaluation , Ordering medications/tests/procedures, referring and communicating with other health care professionals, Documenting clinical information in the EMR, Independently interpreting results (not separately reported), Communicating results to the patient/family/caregiver, Counseling and educating the patient/family/caregiver and Care coordination (not separately reported).

## 2022-02-02 ENCOUNTER — Encounter: Payer: Self-pay | Admitting: Internal Medicine

## 2022-02-08 ENCOUNTER — Encounter: Payer: Self-pay | Admitting: Urology

## 2022-02-08 ENCOUNTER — Ambulatory Visit (INDEPENDENT_AMBULATORY_CARE_PROVIDER_SITE_OTHER): Payer: Medicare Other | Admitting: Urology

## 2022-02-08 ENCOUNTER — Telehealth (HOSPITAL_COMMUNITY): Payer: Self-pay

## 2022-02-08 ENCOUNTER — Other Ambulatory Visit: Payer: Self-pay | Admitting: Urology

## 2022-02-08 VITALS — BP 135/82 | HR 80 | Ht 67.0 in | Wt 240.0 lb

## 2022-02-08 DIAGNOSIS — N201 Calculus of ureter: Secondary | ICD-10-CM | POA: Diagnosis not present

## 2022-02-08 DIAGNOSIS — N200 Calculus of kidney: Secondary | ICD-10-CM

## 2022-02-08 DIAGNOSIS — N319 Neuromuscular dysfunction of bladder, unspecified: Secondary | ICD-10-CM

## 2022-02-08 DIAGNOSIS — Z87440 Personal history of urinary (tract) infections: Secondary | ICD-10-CM

## 2022-02-08 DIAGNOSIS — G8220 Paraplegia, unspecified: Secondary | ICD-10-CM

## 2022-02-08 NOTE — Progress Notes (Signed)
Assessment: 1. Nephrolithiasis   2. History of UTI   3. Ureteral calculi   4. Neurogenic bladder   5. Paraplegia ([place])     Plan: I personally viewed the CT study from 02/01/2022 showing right nephrolithiasis with a 3.6 cm calculus in the right renal pelvis, a right nephrostomy tube in good position, 4 mm right mid ureteral calculus, and 7 mm distal right ureteral calculus. Recommend continuing the right nephrostomy tube at this time. Will discuss possible placement of a internal right ureteral stent with interventional radiology in preparation for possible surgical management of the right ureteral calculi. Urine culture sent today Will consider surgical management of the right ureteral calculi with ureteroscopic laser lithotripsy.   Management of the large right renal calculus would be more extensive and likely require percutaneous nephrolithotomy.  She would require medical clearance to determine if she is medically stable to tolerate a procedure of this magnitude.  Additionally, the right renal stone has been present for some time and appears to be stable. Will make arrangements for follow-up after discussion with IR   Chief Complaint:  Chief Complaint  Patient presents with   Nephrolithiasis    History of Present Illness:  [name] is a 76 y.o. year old female who is seen for evaluation of right renal and ureteral calculi and recent hospital admission for sepsis.  She was previously seen in November 2022 for a neurogenic bladder, possible SP tube placement, and nephrolithiasis.  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  Her Foley catheter is changed approximately every month or as needed.  She has had continued incontinence around the catheter.  She has been on Myrbetriq 25 mg  twice daily for a number of years.  She has not had significant problems with urinary tract infections.  She is essentially bedbound.  CT imaging from 2/22 showed bilateral nephrolithiasis with a 3.7 cm stone in the left upper pole without obstruction.  She has a remote history of a ureteral calculus requiring ureteroscopy.   She was admitted to the hospital on 12/23/2021 with sepsis.  She was found to have severe right hydronephrosis with a 4 mm mid right ureteral stone and a 7 mm right distal ureteral stone.  She also had the large right renal stone measuring 3.6 cm which was stable as compared to prior studies.  She underwent placement of a right nephrostomy tube on 12/24/2021. She was treated with IV Zosyn which was completed on 01/03/2022.  According to her records from [facility], she was on Levaquin 750 mg daily x10 days (started on 01/05/2022).  She was reevaluated with a CT renal stone protocol on 02/01/2022.  The large calculus in the right kidney measuring 3.3 x 3.2 cm in size was again seen, the right nephrostomy tube was noted to be in good position.  The previously noted right ureteral calculi were again seen although not noted in the radiology report.  She returns today for scheduled follow-up.  Her right nephrostomy tube has been draining well with clear urine.  She has had some issues with the Foley catheter not draining well.  She reports that it was recently replaced.  She is not currently on any antibiotics.  No fevers or chills.  No gross hematuria.  Portions of the above documentation were copied  from a prior visit for review purposes only.   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[place])    Pyelonephritis    Thyroid disease     Past Surgical History:  Past  Surgical History:  Procedure Laterality Date   ABDOMINAL HYSTERECTOMY     BACK SURGERY     IR NEPHROSTOMY PLACEMENT RIGHT  12/24/2021    Allergies:  Allergies  Allergen Reactions   Invanz [Ertapenem] Other (See Comments)    Patient states allergy but is not certain    Propranolol Hcl    Rocephin [Ceftriaxone] Hives   Vancomycin Hives    Family History:  Family History  Adopted: Yes  Problem Relation Age of Onset   Heart disease Mother     Social History:  Social History   Tobacco Use   Smoking status: Former   Smokeless tobacco: Never  Substance Use Topics   Alcohol use: Not Currently    Alcohol/week: 0.0 standard drinks of alcohol    Comment: "weekend drinker, Saturday night drunk" years ago   Drug use: No    ROS: Constitutional:  Negative for fever, chills, weight loss CV: Negative for chest pain, previous MI, hypertension Respiratory:  Negative for shortness of breath, wheezing, sleep apnea, frequent cough GI:  Negative for nausea, vomiting, bloody stool, GERD  Physical exam: BP 135/82   Pulse 80   Ht '5\' 7"'$  (1.702 m)   Wt 240 lb (108.9 kg)   BMI 37.59 kg/m  GENERAL APPEARANCE:  Chronically ill appearing, NAD HEENT:  Atraumatic, normocephalic, oropharynx clear NECK:  Supple without lymphadenopathy or thyromegaly ABDOMEN:  Soft, non-tender, no masses EXTREMITIES:  Without clubbing, cyanosis, or edema NEUROLOGIC:  Alert and oriented x 3, on stretcher, CN II-XII grossly intact MENTAL STATUS:  appropriate BACK:  Non-tender to palpation, No CVAT; right nephrostomy tube draining clear yellow urine SKIN:  Warm, dry, and intact GU:  foley draining darker yellow urine  Results: None

## 2022-02-08 NOTE — Telephone Encounter (Signed)
-----   Message from [facility] sent at 02/08/2022  2:02 PM EST ----- Regarding: RE: right nephroureteral stent I have 11/28 @ [facility]  ----- Message ----- From: Suzette Battiest, MD Sent: 02/08/2022   1:51 PM EST To: Primus Bravo, MD; Joanell Rising; # Subject: right nephroureteral stent                     Team,  Please arrange for Ms. Blankley to have her right PCN exchanged and attempt at placing a double J nephroureteral stent, either at [facility] or [facility], first available.  I've discussed this case with Dr. Felipa Eth, included on this message.  Expect an order in the system from him soon.  Please reach out with any questions,  Dylan

## 2022-02-10 ENCOUNTER — Other Ambulatory Visit: Payer: Self-pay

## 2022-02-10 LAB — URINE CULTURE

## 2022-02-10 NOTE — Telephone Encounter (Signed)
Spoke to [place] at facility and notified her of MD instructions.  She requested order be faxed to facility, faxed to [phone].

## 2022-02-10 NOTE — Telephone Encounter (Signed)
-----   Message from Primus Bravo, MD sent at 02/10/2022 10:38 AM EST ----- Please notify nursing home to begin Cefdinir 300 mg PO BID x 7 days beginning on 02/18/22 in preparation for radiology procedure on 11/28.

## 2022-02-11 NOTE — Addendum Note (Signed)
Addended by: Audie Box on: 02/11/2022 12:13 PM   Modules accepted: Orders

## 2022-02-11 NOTE — Telephone Encounter (Signed)
Unable to get in touch with nurse to confirm orders.  Pt is showing allergy to Cefdinir, do you still want to send in for treatment?

## 2022-02-11 NOTE — Telephone Encounter (Signed)
Called facility and requested nurse or nurse manager and I was transferred to Joe, she is aware of MD orders to cancel cefdinirrx and he will have  radiology will give abx prior to procedure.    [place], Reece Leader., MD  Audie Box, CMA Caller: Unspecified Wilburn Mylar,  3:35 PM) Cancel order for Cefdinir. Will have Radiology give antibiotics prior to procedure

## 2022-02-15 ENCOUNTER — Other Ambulatory Visit: Payer: Self-pay | Admitting: Radiology

## 2022-02-15 ENCOUNTER — Ambulatory Visit (HOSPITAL_COMMUNITY): Payer: Medicare Other

## 2022-02-15 DIAGNOSIS — N200 Calculus of kidney: Secondary | ICD-10-CM

## 2022-02-16 ENCOUNTER — Other Ambulatory Visit: Payer: Self-pay | Admitting: Urology

## 2022-02-16 ENCOUNTER — Other Ambulatory Visit: Payer: Self-pay

## 2022-02-16 ENCOUNTER — Ambulatory Visit (HOSPITAL_COMMUNITY)
Admission: RE | Admit: 2022-02-16 | Discharge: 2022-02-16 | Disposition: A | Discharge status: Home / Self Care | Payer: Medicare Other | Source: Ambulatory Visit | Attending: Urology | Admitting: Urology

## 2022-02-16 DIAGNOSIS — I10 Essential (primary) hypertension: Secondary | ICD-10-CM | POA: Diagnosis not present

## 2022-02-16 DIAGNOSIS — N201 Calculus of ureter: Secondary | ICD-10-CM

## 2022-02-16 DIAGNOSIS — N200 Calculus of kidney: Secondary | ICD-10-CM

## 2022-02-16 DIAGNOSIS — Z87891 Personal history of nicotine dependence: Secondary | ICD-10-CM | POA: Diagnosis not present

## 2022-02-16 DIAGNOSIS — N202 Calculus of kidney with calculus of ureter: Secondary | ICD-10-CM | POA: Diagnosis not present

## 2022-02-16 DIAGNOSIS — E119 Type 2 diabetes mellitus without complications: Secondary | ICD-10-CM | POA: Diagnosis not present

## 2022-02-16 DIAGNOSIS — J449 Chronic obstructive pulmonary disease, unspecified: Secondary | ICD-10-CM | POA: Diagnosis not present

## 2022-02-16 DIAGNOSIS — G8220 Paraplegia, unspecified: Secondary | ICD-10-CM | POA: Insufficient documentation

## 2022-02-16 DIAGNOSIS — K219 Gastro-esophageal reflux disease without esophagitis: Secondary | ICD-10-CM | POA: Insufficient documentation

## 2022-02-16 HISTORY — PX: IR NEPHROSTOMY EXCHANGE LEFT: IMG6069

## 2022-02-16 HISTORY — PX: IR NEPHROSTOGRAM RIGHT THRU EXISTING ACCESS: IMG6062

## 2022-02-16 HISTORY — PX: IR NEPHROSTOMY EXCHANGE RIGHT: IMG6070

## 2022-02-16 LAB — BASIC METABOLIC PANEL
Anion gap: 6 (ref 5–15)
BUN: 12 mg/dL (ref 8–23)
CO2: 24 mmol/L (ref 22–32)
Calcium: 8.1 mg/dL — ABNORMAL LOW (ref 8.9–10.3)
Chloride: 111 mmol/L (ref 98–111)
Creatinine, Ser: 0.39 mg/dL — ABNORMAL LOW (ref 0.44–1.00)
GFR, Estimated: >60 mL/min (ref >60)
Glucose, Bld: 81 mg/dL (ref 70–99)
Potassium: 4 mmol/L (ref 3.5–5.1)
Sodium: 141 mmol/L (ref 135–145)

## 2022-02-16 LAB — CBC
HCT: 32.9 % — ABNORMAL LOW (ref 36.0–46.0)
Hemoglobin: 10.5 g/dL — ABNORMAL LOW (ref 12.0–15.0)
MCH: 29.2 pg (ref 26.0–34.0)
MCHC: 31.9 g/dL (ref 30.0–36.0)
MCV: 91.4 fL (ref 80.0–100.0)
Platelets: 248 10*3/uL (ref 150–400)
RBC: 3.6 MIL/uL — ABNORMAL LOW (ref 3.87–5.11)
RDW: 15.9 % — ABNORMAL HIGH (ref 11.5–15.5)
WBC: 9.9 10*3/uL (ref 4.0–10.5)
nRBC: 0 % (ref 0.0–0.2)

## 2022-02-16 LAB — PROTIME-INR
INR: 1.1 (ref 0.8–1.2)
Prothrombin Time: 14.1 seconds (ref 11.4–15.2)

## 2022-02-16 MED ORDER — FENTANYL CITRATE (PF) 100 MCG/2ML IJ SOLN
INTRAMUSCULAR | Status: AC | PRN
  Administered 2022-02-16: 25 ug via INTRAVENOUS

## 2022-02-16 MED ORDER — SODIUM CHLORIDE 0.9 % IV SOLN: INTRAVENOUS | Status: DC

## 2022-02-16 MED ORDER — MIDAZOLAM HCL 2 MG/2ML IJ SOLN
INTRAMUSCULAR | Status: AC
  Filled 2022-02-16: qty 2

## 2022-02-16 MED ORDER — FENTANYL CITRATE (PF) 100 MCG/2ML IJ SOLN
INTRAMUSCULAR | Status: AC
  Filled 2022-02-16: qty 2

## 2022-02-16 MED ORDER — LEVOFLOXACIN IN D5W 750 MG/150ML IV SOLN
750.0000 mg | INTRAVENOUS | Status: AC
  Administered 2022-02-16: 750 mg via INTRAVENOUS
  Filled 2022-02-16 (×2): qty 150

## 2022-02-16 MED ORDER — IOHEXOL 300 MG/ML  SOLN
50.0000 mL | Freq: Once | INTRAMUSCULAR | Status: AC | PRN
  Administered 2022-02-16: 20 mL

## 2022-02-16 MED ORDER — MIDAZOLAM HCL 2 MG/2ML IJ SOLN
INTRAMUSCULAR | Status: AC | PRN
  Administered 2022-02-16: .5 mg via INTRAVENOUS

## 2022-02-16 MED ORDER — LIDOCAINE HCL 1 % IJ SOLN
INTRAMUSCULAR | Status: AC
  Filled 2022-02-16: qty 20

## 2022-02-16 NOTE — Progress Notes (Signed)
Pt smells of urine, Angie NT tried to clean pt, pt refused to be washed.

## 2022-02-16 NOTE — Progress Notes (Signed)
RN attempted to call [facility] five times with no answer. RN not able to complete medication reconciliation prior to radiology procedure at this time.

## 2022-02-16 NOTE — Procedures (Signed)
Pre Procedure Dx: Hydronephrosis Post Procedural Dx: Same  Left sided antegrade nephrostogram demonstrates patency of the left ureter to the level of the urinary bladder. Successful fluoroscopic guided left sided 10 Fr PCN exchange. PCN connected to gravity bag.  EBL: None Complications: None immediate.  Ronny Bacon, MD Pager #: [phone]

## 2022-02-16 NOTE — H&P (Signed)
Chief Complaint: Patient was seen in consultation today for JJ stent placement  Referring Physician(s): Stoneking,Bradley J.  Supervising Physician: Ruthann Cancer  Patient Status: [facility] - Out-pt  History of Present Illness: [name] is a 76 y.o. female with a past medical history significant for bipolar 1, GERD, HTN, DM, COPD, paraplegia, neurogenic bladder and large right nephrolithiasis s/p right nephrostomy placement 12/24/21 who presents today for JJ stent placement and PCN exchange. 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  She has been followed by urology for these issues since November 2022. She was admitted to [facility] on 12/23/21 with sepsis and was found to have severe right hydronephrosis with a 4 mm mid right ureteral stone, a 7 mm right distal ureteral stone and a 3.6 cm right renal stone. She underwent right nephrostomy tube placement 12/24/21 and was started on IV antibiotics. She was discharged to SNF on oral antibiotics which she has since completed. She underwent CT renal stone study 02/01/22 which showed:  1. Bulky RIGHT staghorn calculus. Percutaneous RIGHT nephrostomy tube in place. No hydronephrosis. 2. Small punctate LEFT renal calculi without obstruction. 3. Bilateral benign Bosniak 1 and Bosniak 2 renal cysts. No specific follow-up recommended. 4. No ureterolithiasis or obstructive uropathy. 5. Foley catheter within collapsed bladder. 6. Large stool ball in the rectum. 7. Severe degenerative change compression deformities of the lumbar spine.  IR has been consulted by urology for JJ stent placement and PCN exchange in anticipation of percutaneous nephrolithotomy.   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 Authorizing Provider  acetaminophen (TYLENOL) 325 MG tablet Take 1 tablet (325 mg total) by mouth every 6 (six) hours as needed for mild pain, moderate pain, fever or headache. *May take one every 6 hours as needed for pain Patient taking differently: Take 650 mg by mouth every 6 (six) hours as needed for mild pain, moderate pain, fever or headache. 12/29/21  Yes Rai, Ripudeep K, MD  Amino Acids-Protein Hydrolys (FEEDING SUPPLEMENT, PRO-STAT 64,) LIQD Take 30 mLs by mouth 3 (three) times daily with meals.   Yes [provider]  atorvastatin (LIPITOR) 10 MG tablet Take 10 mg by mouth every evening.   Yes [provider]  azelastine (ASTELIN) 0.1 % nasal spray Place 2 sprays into both nostrils 2 (two) times daily. Use in each nostril as directed   Yes [provider]  b complex vitamins tablet Take 1 tablet by mouth daily.   Yes [provider]  Calcium Carb-Cholecalciferol (CALCIUM CARBONATE-VITAMIN D3 PO) Take 1 tablet by mouth in the morning and at bedtime.   Yes [provider]  carvedilol (COREG) 6.25 MG tablet Take 6.25 mg by mouth 2 (two) times daily. Hold for  SBP <100 or HR < 60   Yes [provider]  Cranberry 500 MG TABS Take 1 capsule by mouth daily.   Yes [provider]  dextromethorphan-guaiFENesin (MUCINEX DM) 30-600 MG per 12 hr tablet Take 1 tablet by mouth every 12 (twelve) hours as needed for cough.   Yes [provider]  docusate sodium  (COLACE) 100 MG capsule Take 200 mg by mouth at bedtime.   Yes [provider]  Eyelid Cleansers (OCUSOFT EYELID CLEANSING) PADS Apply 1 Application topically daily.   Yes [provider]  ezetimibe (ZETIA) 10 MG tablet Take 10 mg by mouth at bedtime.    Yes [provider]  famotidine (PEPCID) 20 MG tablet Take 20 mg by mouth 2 (two) times daily.   Yes [provider]  ferrous sulfate 325 (65 FE) MG EC tablet Take 325 mg by mouth 3 (three) times daily with meals.   Yes [provider]  fluticasone (FLONASE) 50 MCG/ACT nasal spray Place 2 sprays into both nostrils daily.  09/03/14  Yes [provider]  gabapentin (NEURONTIN) 300 MG capsule Take 300 mg by mouth every 8 (eight) hours.   Yes [provider]  ipratropium-albuterol (DUONEB) 0.5-2.5 (3) MG/3ML SOLN Take 3 mLs by nebulization every 6 (six) hours as needed (shortness of breath). 11/19/21  Yes Emokpae, Courage, MD  Lactobacillus (ACIDOPHILUS PO) Take 1 tablet by mouth in the morning and at bedtime.   Yes [provider]  levothyroxine (SYNTHROID) 150 MCG tablet Take 150 mcg by mouth daily.   Yes [provider]  lisinopril (ZESTRIL) 2.5 MG tablet Take 1.25 mg by mouth daily.   Yes [provider]  Magnesium Oxide -Mg Supplement (MAG-OXIDE) 200 MG TABS Take 400 mg by mouth daily.   Yes [provider]  Menthol-Zinc Oxide (CALMOSEPTINE) 0.44-20.6 % OINT Apply 1 Application topically every 8 (eight) hours as needed (protection/prevention).   Yes [provider]  midodrine (PROAMATINE) 5 MG tablet Take 1 tablet (5 mg total) by mouth 3 (three) times daily with meals. Patient taking differently: Take 5 mg by mouth 2 (two) times daily. 12/29/21  Yes Rai, Ripudeep K, MD  mirabegron ER (MYRBETRIQ) 25 MG TB24 tablet Take 25 mg by mouth daily.   Yes [provider]  Multiple Vitamins-Minerals (CERTAGEN PO) Take 1 tablet by mouth daily.   Yes  [provider]  Nutritional Supplements (ARGINAID) PACK Take 1 packet by mouth daily.   Yes [provider]  olopatadine (PATADAY) 0.1 % ophthalmic solution Place 1 drop into both eyes every 6 (six) hours as needed for allergies.   Yes [provider]  polyethylene glycol (MIRALAX / GLYCOLAX) 17 g packet Take 17 g by mouth daily as needed for moderate constipation. 12/29/21  Yes Rai, Ripudeep K, MD  promethazine (PHENERGAN) 12.5 MG suppository Place 12.5 mg rectally every 12 (twelve) hours as needed for nausea or vomiting.   Yes [provider]  rizatriptan (MAXALT) 10 MG tablet Take 10 mg by mouth daily as needed for migraine.   Yes [provider]  tamsulosin (FLOMAX) 0.4 MG CAPS capsule Take 1 capsule (0.4 mg total) by mouth daily after supper. 11/19/21  Yes Emokpae, Courage, MD  topiramate (TOPAMAX) 50 MG tablet Take 50 mg by mouth 2 (two) times daily.   Yes [provider]  vitamin C (ASCORBIC ACID) 500 MG tablet Take 500 mg by mouth 2 (two) times daily.   Yes [provider]  Vitamin D, Ergocalciferol, (DRISDOL) 1.25 MG (50000 UNIT) CAPS capsule Take 50,000 Units by mouth every Saturday.   Yes [provider]     Family History  Adopted: Yes  Problem Relation Age of Onset   Heart disease Mother     Social History   Socioeconomic History   Marital status: Single    Spouse name: Not on file   Number of children: Not on file   Years of education: Not on file   Highest education level: Not on file  Occupational History   Not on file  Tobacco Use   Smoking status: Former   Smokeless tobacco: Never  Substance and Sexual Activity   Alcohol use: Not Currently    Alcohol/week: 0.0 standard drinks of alcohol    Comment: "weekend drinker, Saturday night drunk" years ago   Drug use: No   Sexual activity: Never  Other Topics Concern   Not on file  Social History Narrative   Not on file   Social Determinants of  Health   Financial Resource Strain: Not on file  Food Insecurity: Unknown (12/29/2021)   Hunger Vital Sign    Worried About Running Out of Food in the Last Year: Patient refused    [place] in the Last Year: Patient refused  Transportation Needs: No Transportation Needs (12/29/2021)   PRAPARE - Hydrologist (Medical): No    Lack of Transportation (Non-Medical): No  Physical Activity: Not on file  Stress: Not on file  Social Connections: Not on file     Review of Systems: A 12 point ROS discussed and pertinent positives are indicated in the HPI above.  All other systems are negative.  Review of Systems  Constitutional:  Negative for chills and fever.  Respiratory:  Positive for shortness of breath (COPD, "bronchitis is acting up lately"). Negative for cough.   Cardiovascular:  Negative for chest pain.  Gastrointestinal:  Negative for abdominal pain, nausea and vomiting.  Neurological:  Negative for dizziness and headaches.    Vital Signs: BP (!) 166/87   Pulse 80   Temp 97.8 F (36.6 C) (Temporal)   Resp 17   Ht '5\' 7"'$  (1.702 m)   Wt 236 lb (107 kg)   SpO2 97%   BMI 36.96 kg/m   Physical Exam Vitals reviewed.  Constitutional:      General: She is not in acute distress. HENT:     Head: Normocephalic.  Cardiovascular:     Rate and Rhythm: Normal rate and regular rhythm.  Pulmonary:     Effort: Pulmonary effort is normal.     Breath sounds: Normal breath sounds.  Abdominal:     Palpations: Abdomen is soft.  Genitourinary:    Comments: (+) foley and right PCN draining hazy yellow urine Skin:    General: Skin is warm and dry.  Neurological:     Mental Status: She is alert and oriented to person, place, and time.  Psychiatric:        Mood and Affect: Mood normal.        Behavior: Behavior normal.        Thought Content: Thought content normal.        Judgment: Judgment normal.      MD Evaluation Airway: WNL Heart:  WNL Abdomen: WNL Chest/ Lungs: WNL (History of COPD, does not wear oxygen at home) ASA  Classification: 3 Mallampati/Airway Score: Two   Imaging: CT RENAL STONE STUDY  Result Date:  02/01/2022 CLINICAL DATA:  Ureteral calculi. Follow-up kidney stones. Hematuria for 6 months. EXAM: CT ABDOMEN AND PELVIS WITHOUT CONTRAST TECHNIQUE: Multidetector CT imaging of the abdomen and pelvis was performed following the standard protocol without IV contrast. RADIATION DOSE REDUCTION: This exam was performed according to the departmental dose-optimization program which includes automated exposure control, adjustment of the mA and/or kV according to patient size and/or use of iterative reconstruction technique. COMPARISON:  None Available. FINDINGS: Lower chest: Moderate atelectasis at the RIGHT lung base. Hepatobiliary: No focal hepatic lesion on noncontrast exam. Gallbladder normal. Pancreas: Pancreas is normal. No ductal dilatation. No pancreatic inflammation. Spleen: Normal spleen Adrenals/urinary tract: Adrenal glands normal. Large dense calculus in the upper pole RIGHT kidney which extends into the hilum measuring 3.3 x 3.2 cm in axial dimension. Percutaneous nephrostomy tube with tip coiled in the RIGHT renal hilum. No RIGHT hydronephrosis. Two simple fluid attenuation cystic lesions of the RIGHT kidney. RIGHT ureter normal. Foley catheter within the bladder. Bladder is collapsed. LEFT kidney has a several punctate nonobstructing calculus measuring 1 mm in the mid kidney. Small intermediate density lesion along the cortex of the LEFT kidney measures 18 mm (image 43/2. Two additional simple fluid attenuation cyst in the LEFT kidney. The anterior cortical lesion described above is decreased in size from 05/21/2020 which time it measured 2.2 cm and had no significant post-contrast enhancement Stomach/Bowel: Stomach, small bowel normal. Cecum ascending colon normal. Descending colon normal. Large volume stool in the  rectum. Stool ball measures 9.5 cm in diameter Vascular/Lymphatic: Abdominal aorta is normal caliber. No periportal or retroperitoneal adenopathy. No pelvic adenopathy. Reproductive: IPost hysterectomy.  Adnexa unremarkable Other: No free fluid. Musculoskeletal: No aggressive osseous lesion. Chronic compression deformities Marked degenerative change marked degenerative changes in the spine IMPRESSION: 1. Bulky RIGHT staghorn calculus. Percutaneous RIGHT nephrostomy tube in place. No hydronephrosis. 2. Small punctate LEFT renal calculi without obstruction. 3. Bilateral benign Bosniak 1 and Bosniak 2 renal cysts. No specific follow-up recommended. 4. No ureterolithiasis or obstructive uropathy. 5. Foley catheter within collapsed bladder. 6. Large stool ball in the rectum. 7. Severe degenerative change compression deformities of the lumbar spine. Electronically Signed   By: Suzy Bouchard M.D.   On: 02/01/2022 14:09    Labs:  CBC: Recent Labs    12/27/21 0459 12/28/21 0303 12/29/21 0424 02/16/22 1350  WBC 8.0 11.0* 9.2 9.9  HGB 7.1* 8.3* 8.1* 10.5*  HCT 23.2* 26.6* 26.5* 32.9*  PLT 252 265 266 248    COAGS: Recent Labs    11/14/21 2053 11/15/21 0352 12/24/21 0244 02/16/22 1350  INR 1.4* 1.5* 1.4* 1.1  APTT 48*  --   --   --     BMP: Recent Labs    12/26/21 0213 12/27/21 0459 12/28/21 0303 12/29/21 0424  NA 134* 137 135 134*  K 3.5 3.0* 4.6 4.5  CL 108 113* 108 106  CO2 20* 19* 19* 20*  GLUCOSE 89 90 77 82  BUN '14 13 11 13  '$ CALCIUM 7.8* 6.7* 8.1* 8.3*  CREATININE 0.51 0.49 0.53 0.63  GFRNONAA >60 >60 >60 >60    LIVER FUNCTION TESTS: Recent Labs    11/14/21 2053 11/15/21 0352 11/18/21 0310 12/22/21 1342  BILITOT 1.0 0.7 0.6 1.0  AST 46* 50* 46* 29  ALT 29 32 40 21  ALKPHOS 139* 130* 101 146*  PROT 6.6 5.9* 5.2* 6.8  ALBUMIN 2.8* 2.3* 2.0* 2.2*    TUMOR MARKERS: No results for input(s): "AFPTM", "CEA", "CA199", "CHROMGRNA" in  the last 8760  hours.  Assessment and Plan:  76 y/o F with history of right nephrolithiasis and right hydronephrosis s/p right PCN placement 12/24/21 for sepsis who presents today for JJ stent placement and right PCN exchange in anticipation of percutaneous nephrolithotomy with urology in the future.  Risks and benefits of right JJ stent placement and right PCN exchange was discussed with the patient including, but not limited to, infection, bleeding, significant bleeding causing loss or decrease in renal function or damage to adjacent structures.   All of the patient's questions were answered, patient is agreeable to proceed.  Consent signed and in chart.  Thank you for this interesting consult.  I greatly enjoyed meeting JUNELLE HASHEMI and look forward to participating in their care.  A copy of this report was sent to the requesting provider on this date.  Electronically Signed: Joaquim Nam, PA-C 02/16/2022, 2:40 PM   I spent a total of 30 Minutes   in face to face in clinical consultation, greater than 50% of which was counseling/coordinating care for right JJ stent placement and right PCN exchange.

## 2022-02-18 ENCOUNTER — Other Ambulatory Visit: Payer: Self-pay | Admitting: Urology

## 2022-02-18 ENCOUNTER — Encounter (HOSPITAL_COMMUNITY): Payer: Self-pay

## 2022-02-18 DIAGNOSIS — N201 Calculus of ureter: Secondary | ICD-10-CM

## 2022-02-18 DIAGNOSIS — N200 Calculus of kidney: Secondary | ICD-10-CM

## 2022-02-22 NOTE — Progress Notes (Signed)
Pre procedure protcol

## 2022-02-23 ENCOUNTER — Other Ambulatory Visit: Payer: Self-pay

## 2022-02-23 ENCOUNTER — Emergency Department (HOSPITAL_COMMUNITY)
Admission: EM | Admit: 2022-02-23 | Discharge: 2022-02-23 | Disposition: A | Discharge status: Home / Self Care | Payer: Medicare Other | Attending: Emergency Medicine | Admitting: Emergency Medicine

## 2022-02-23 ENCOUNTER — Encounter (HOSPITAL_COMMUNITY): Payer: Self-pay | Admitting: *Deleted

## 2022-02-23 ENCOUNTER — Emergency Department (HOSPITAL_COMMUNITY): Payer: Medicare Other

## 2022-02-23 DIAGNOSIS — N99528 Other complication of other external stoma of urinary tract: Secondary | ICD-10-CM | POA: Diagnosis present

## 2022-02-23 DIAGNOSIS — E119 Type 2 diabetes mellitus without complications: Secondary | ICD-10-CM | POA: Diagnosis not present

## 2022-02-23 DIAGNOSIS — Z87891 Personal history of nicotine dependence: Secondary | ICD-10-CM | POA: Insufficient documentation

## 2022-02-23 DIAGNOSIS — J45909 Unspecified asthma, uncomplicated: Secondary | ICD-10-CM | POA: Insufficient documentation

## 2022-02-23 DIAGNOSIS — Z79899 Other long term (current) drug therapy: Secondary | ICD-10-CM | POA: Diagnosis not present

## 2022-02-23 DIAGNOSIS — E039 Hypothyroidism, unspecified: Secondary | ICD-10-CM | POA: Diagnosis not present

## 2022-02-23 DIAGNOSIS — T83022A Displacement of nephrostomy catheter, initial encounter: Secondary | ICD-10-CM

## 2022-02-23 DIAGNOSIS — J449 Chronic obstructive pulmonary disease, unspecified: Secondary | ICD-10-CM | POA: Insufficient documentation

## 2022-02-23 DIAGNOSIS — I10 Essential (primary) hypertension: Secondary | ICD-10-CM | POA: Diagnosis not present

## 2022-02-23 LAB — CBC WITH DIFFERENTIAL/PLATELET
Abs Immature Granulocytes: 0.04 10*3/uL (ref 0.00–0.07)
Basophils Absolute: 0.1 10*3/uL (ref 0.0–0.1)
Basophils Relative: 1 %
Eosinophils Absolute: 0.3 10*3/uL (ref 0.0–0.5)
Eosinophils Relative: 3 %
HCT: 33.6 % — ABNORMAL LOW (ref 36.0–46.0)
Hemoglobin: 10.3 g/dL — ABNORMAL LOW (ref 12.0–15.0)
Immature Granulocytes: 0 %
Lymphocytes Relative: 22 %
Lymphs Abs: 2.1 10*3/uL (ref 0.7–4.0)
MCH: 28 pg (ref 26.0–34.0)
MCHC: 30.7 g/dL (ref 30.0–36.0)
MCV: 91.3 fL (ref 80.0–100.0)
Monocytes Absolute: 0.5 10*3/uL (ref 0.1–1.0)
Monocytes Relative: 5 %
Neutro Abs: 6.6 10*3/uL (ref 1.7–7.7)
Neutrophils Relative %: 69 %
Platelets: 233 10*3/uL (ref 150–400)
RBC: 3.68 MIL/uL — ABNORMAL LOW (ref 3.87–5.11)
RDW: 16.2 % — ABNORMAL HIGH (ref 11.5–15.5)
WBC: 9.6 10*3/uL (ref 4.0–10.5)
nRBC: 0 % (ref 0.0–0.2)

## 2022-02-23 LAB — BASIC METABOLIC PANEL
Anion gap: 7 (ref 5–15)
BUN: 14 mg/dL (ref 8–23)
CO2: 22 mmol/L (ref 22–32)
Calcium: 8.7 mg/dL — ABNORMAL LOW (ref 8.9–10.3)
Chloride: 110 mmol/L (ref 98–111)
Creatinine, Ser: 0.33 mg/dL — ABNORMAL LOW (ref 0.44–1.00)
GFR, Estimated: >60 mL/min (ref >60)
Glucose, Bld: 100 mg/dL — ABNORMAL HIGH (ref 70–99)
Potassium: 3.2 mmol/L — ABNORMAL LOW (ref 3.5–5.1)
Sodium: 139 mmol/L (ref 135–145)

## 2022-02-23 MED ORDER — POTASSIUM CHLORIDE CRYS ER 20 MEQ PO TBCR
40.0000 meq | EXTENDED_RELEASE_TABLET | Freq: Once | ORAL | Status: AC
  Administered 2022-02-23: 40 meq via ORAL
  Filled 2022-02-23: qty 2

## 2022-02-23 NOTE — ED Provider Notes (Signed)
[facility] EMERGENCY DEPARTMENT Provider Note  CSN: [id number] Arrival date & time: 02/23/22 1225  Chief Complaint(s) nephrostomy tube came out  HPI [name] is a 76 y.o. female with history of bipolar disorder, diabetes, COPD, paraplegia, right nephrostomy tube due to large renal calculus presenting with concern for nephrostomy tube dislodgment.  She reports that she thinks it was pulled out around 3 inches.  She reports mild discomfort at the site.  No fevers or chills, nausea or vomiting.  Patient otherwise feels at baseline.  She reports this happened Monday.  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[place])    Pyelonephritis    Thyroid disease    Patient Active Problem List   Diagnosis Date Noted   Ureteral calculi 01/13/2022   Hydronephrosis 12/27/2021   Septic shock ([place]) 12/22/2021   Hyperkalemia    Acute metabolic encephalopathy 34/19/3790   Pressure injury of skin 11/15/2021   Neurogenic bladder 02/23/2021   Urinary incontinence 02/23/2021   Nephrolithiasis 02/23/2021   Constipation 06/05/2020   Hypokalemia 04/04/2016   Hyperglycemia 04/04/2016   Cellulitis 04/03/2016   Sepsis ([place]) 09/08/2014   Chronic paraplegia ([place]) 09/08/2014   Swelling of right lower extremity 07/21/2014   Discitis of lumbar region    Abscess in epidural space of lumbar spine    Blood poisoning    Osteomyelitis ([place])    Discitis of thoracolumbar region    Anxiety state    Chronic pain syndrome    Sepsis due to Candida species ([place])    Spondylarthrosis    AKI (acute kidney injury) ([place])    Other specified hypothyroidism    Essential hypertension    Paraplegia ([place])    Enteritis due to Clostridium difficile 05/08/2014   UTI (urinary tract infection) 05/08/2014    Chronic indwelling Foley catheter 05/08/2014   Hypotension    Decubitus ulcer of sacral region, unstageable ([place]) 03/28/2014   Hypothyroid 03/28/2014   Depression 03/28/2014   ARF (acute renal failure) ([place]) 03/28/2014   Acute respiratory failure with hypoxia ([place]) 03/27/2014   Pyelonephritis 10/22/2013   Leukocytosis 10/22/2013   Hyponatremia 10/22/2013   Encephalopathy acute 10/22/2013   Hypothyroidism 10/22/2013   Hypertension    Bipolar 1 disorder ([place])    Thyroid disease    Diabetes mellitus without complication ([place])    GERD (gastroesophageal reflux disease)    SHOULDER PAIN 11/19/2007   HAND, ARTHRITIS, DEGEN./OSTEO 09/03/2007   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 Authorizing Provider  acetaminophen (TYLENOL) 325 MG tablet Take 1 tablet (325 mg total) by mouth every 6 (six) hours as needed for mild pain, moderate pain, fever or headache. *May take one every 6 hours as needed for pain Patient taking differently: Take 650 mg by mouth every 6 (six) hours as needed for mild pain, moderate pain, fever or headache. 12/29/21  Yes Rai, Ripudeep K, MD  Amino Acids-Protein Hydrolys (FEEDING SUPPLEMENT, PRO-STAT 64,) LIQD Take 30 mLs by mouth 3 (three) times daily with meals.   Yes [provider]  atorvastatin (LIPITOR) 10 MG tablet Take 10 mg by mouth every evening.   Yes [provider]  azelastine (  ASTELIN) 0.1 % nasal spray Place 2 sprays into both nostrils 2 (two) times daily. Use in each nostril as directed   Yes [provider]  b complex vitamins tablet Take 1 tablet by mouth daily.   Yes [provider]  Calcium Carb-Cholecalciferol (CALCIUM CARBONATE-VITAMIN D3 PO) Take 1 tablet by mouth in the morning and at bedtime.   Yes [provider]  carvedilol (COREG) 6.25 MG tablet Take 6.25 mg by mouth 2 (two) times daily.  Hold for SBP <100 or HR < 60   Yes [provider]  Cranberry 500 MG TABS Take 1 capsule by mouth daily.   Yes [provider]  dextromethorphan-guaiFENesin (MUCINEX DM) 30-600 MG per 12 hr tablet Take 1 tablet by mouth every 12 (twelve) hours as needed for cough.   Yes [provider]  docusate sodium (COLACE) 100 MG capsule Take 200 mg by mouth at bedtime.   Yes [provider]  Eyelid Cleansers (OCUSOFT EYELID CLEANSING) PADS Apply 1 Application topically daily.   Yes [provider]  ezetimibe (ZETIA) 10 MG tablet Take 10 mg by mouth at bedtime.    Yes [provider]  famotidine (PEPCID) 20 MG tablet Take 20 mg by mouth 2 (two) times daily.   Yes [provider]  ferrous sulfate 325 (65 FE) MG EC tablet Take 325 mg by mouth 3 (three) times daily with meals.   Yes [provider]  fluticasone (FLONASE) 50 MCG/ACT nasal spray Place 2 sprays into both nostrils daily.  09/03/14  Yes [provider]  gabapentin (NEURONTIN) 300 MG capsule Take 300 mg by mouth every 8 (eight) hours.   Yes [provider]  ipratropium-albuterol (DUONEB) 0.5-2.5 (3) MG/3ML SOLN Take 3 mLs by nebulization every 6 (six) hours as needed (shortness of breath). 11/19/21  Yes Emokpae, Courage, MD  Lactobacillus (ACIDOPHILUS PO) Take 1 tablet by mouth in the morning and at bedtime.   Yes [provider]  levothyroxine (SYNTHROID) 150 MCG tablet Take 150 mcg by mouth daily.   Yes [provider]  lisinopril (ZESTRIL) 2.5 MG tablet Take 1.25 mg by mouth daily.   Yes [provider]  Magnesium Oxide -Mg Supplement (MAG-OXIDE) 200 MG TABS Take 400 mg by mouth daily.   Yes [provider]  Menthol-Zinc Oxide (CALMOSEPTINE) 0.44-20.6 % OINT Apply 1 Application topically every 8 (eight) hours as needed (protection/prevention).   Yes [provider]  midodrine (PROAMATINE) 5 MG tablet Take 1 tablet (5 mg  total) by mouth 3 (three) times daily with meals. Patient taking differently: Take 5 mg by mouth 2 (two) times daily. 12/29/21  Yes Rai, Ripudeep K, MD  mirabegron ER (MYRBETRIQ) 25 MG TB24 tablet Take 25 mg by mouth daily.   Yes [provider]  Multiple Vitamins-Minerals (CERTAGEN PO) Take 1 tablet by mouth daily.   Yes [provider]  Nutritional Supplements (ARGINAID) PACK Take 1 packet by mouth daily.   Yes [provider]  olopatadine (PATADAY) 0.1 % ophthalmic solution Place 1 drop into both eyes every 6 (six) hours as needed for allergies.   Yes [provider]  polyethylene glycol (MIRALAX / GLYCOLAX) 17 g packet Take 17 g by mouth daily as needed for moderate constipation. 12/29/21  Yes Rai, Ripudeep K, MD  promethazine (PHENERGAN) 12.5 MG suppository Place 12.5 mg rectally every 12 (twelve) hours as needed for nausea or vomiting.   Yes [provider]  rizatriptan (MAXALT) 10 MG tablet  Take 10 mg by mouth daily as needed for migraine.   Yes [provider]  tamsulosin (FLOMAX) 0.4 MG CAPS capsule Take 1 capsule (0.4 mg total) by mouth daily after supper. 11/19/21  Yes Emokpae, Courage, MD  topiramate (TOPAMAX) 50 MG tablet Take 50 mg by mouth 2 (two) times daily.   Yes [provider]  vitamin C (ASCORBIC ACID) 500 MG tablet Take 500 mg by mouth 2 (two) times daily.   Yes [provider]  Vitamin D, Ergocalciferol, (DRISDOL) 1.25 MG (50000 UNIT) CAPS capsule Take 50,000 Units by mouth every Saturday.   Yes [provider]                                                                                                                                    Past Surgical History Past Surgical History:  Procedure Laterality Date   ABDOMINAL HYSTERECTOMY     BACK SURGERY     IR NEPHROSTOMY EXCHANGE LEFT  02/16/2022   IR NEPHROSTOMY PLACEMENT RIGHT  12/24/2021   Family History Family History  Adopted: Yes  Problem  Relation Age of Onset   Heart disease Mother     Social History Social History   Tobacco Use   Smoking status: Former   Smokeless tobacco: Never  Substance Use Topics   Alcohol use: Not Currently    Alcohol/week: 0.0 standard drinks of alcohol    Comment: "weekend drinker, Saturday night drunk" years ago   Drug use: No   Allergies Codeine, Invanz [ertapenem], Propranolol hcl, Rocephin [ceftriaxone], and Vancomycin  Review of Systems Review of Systems  All other systems reviewed and are negative.   Physical Exam Vital Signs  I have reviewed the triage vital signs BP (!) 168/80 (BP Location: Left Arm)   Pulse 75   Temp 98.1 F (36.7 C) (Oral)   Resp 16   Ht '5\' 7"'$  (1.702 m)   Wt 107 kg   SpO2 100%   BMI 36.95 kg/m  Physical Exam Vitals and nursing note reviewed.  Constitutional:      General: She is not in acute distress.    Appearance: She is well-developed. She is obese.  HENT:     Head: Normocephalic and atraumatic.     Mouth/Throat:     Mouth: Mucous membranes are moist.  Eyes:     Pupils: Pupils are equal, round, and reactive to light.  Cardiovascular:     Rate and Rhythm: Normal rate and regular rhythm.     Heart sounds: No murmur heard. Pulmonary:     Effort: Pulmonary effort is normal. No respiratory distress.     Breath sounds: Normal breath sounds.  Abdominal:     General: Abdomen is flat.     Palpations: Abdomen is soft.     Tenderness: There is no abdominal tenderness. There is no right CVA tenderness or left CVA tenderness.     Comments: Right  nephrostomy tube site with some mild skin breakdown, no drainage, mild erythema.  No warmth.  Tube is draining yellow urine  Skin:    General: Skin is warm and dry.  Neurological:     General: No focal deficit present.     Mental Status: She is alert. Mental status is at baseline.  Psychiatric:        Mood and Affect: Mood normal.        Behavior: Behavior normal.     ED Results and  Treatments Labs (all labs ordered are listed, but only abnormal results are displayed) Labs Reviewed  BASIC METABOLIC PANEL - Abnormal; Notable for the following components:      Result Value   Potassium 3.2 (*)    Glucose, Bld 100 (*)    Creatinine, Ser 0.33 (*)    Calcium 8.7 (*)    All other components within normal limits  CBC WITH DIFFERENTIAL/PLATELET - Abnormal; Notable for the following components:   RBC 3.68 (*)    Hemoglobin 10.3 (*)    HCT 33.6 (*)    RDW 16.2 (*)    All other components within normal limits                                                                                                                          Radiology CT Renal Stone Study  Result Date: 02/23/2022 CLINICAL DATA:  Nephrostomy tube catheter pulled out about 3 inches in a patient with large renal calculus. EXAM: CT ABDOMEN AND PELVIS WITHOUT CONTRAST TECHNIQUE: Multidetector CT imaging of the abdomen and pelvis was performed following the standard protocol without IV contrast. RADIATION DOSE REDUCTION: This exam was performed according to the departmental dose-optimization program which includes automated exposure control, adjustment of the mA and/or kV according to patient size and/or use of iterative reconstruction technique. COMPARISON:  January 31, 2022 FINDINGS: Lower chest: Slightly improved aeration at the RIGHT lung base still with patchy ground-glass nodularity, volume loss and consolidative changes. These are mild and improved since previous imaging. No pleural effusion. Mild LEFT basilar atelectasis. Hepatobiliary: Nodular hepatic contour with signs of fissural widening. Sludge in the gallbladder. No pericholecystic stranding. No gross biliary duct dilation. 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 No sign of pancreatic inflammation. Spleen: Mild splenomegaly unchanged. Adrenals/Urinary Tract: Adrenal glands are normal. Marked perinephric stranding. Nephrostomy tube in place with  marker for sideholes within the parenchyma of the interpolar RIGHT kidney, not extending beyond the renal parenchyma. Marker for sideholes with similar appearance in terms of positioning compared to previous imaging. Pigtail portion is coiled within lower pole collecting system elements similar to previous imaging. Large branch type calculus with lamellated appearance extends into the UPJ from the renal sinus measuring 3.7 x 2.7 cm similar to prior imaging. Renal cysts and marked RIGHT renal cortical atrophy are similar to prior imaging as well. LEFT renal lesions are unchanged including an intermediate density lesion arising from the lower pole measuring approximately  1.7 cm, this area shown to represent a Bosniak category II cyst on prior imaging. LEFT renal cysts are stable. No dedicated follow-up imaging is suggested for renal cysts. Urinary bladder is collapsed with Foley catheter in place. There is no hydronephrosis. Distal RIGHT ureteral calculi with similar appearance, largest measuring 11 mm just proximal to the RIGHT UVJ. Two additional calculi present in the RIGHT ureter. The largest 11 mm calculus may have progressed slightly towards the UVJ since previous imaging. Stomach/Bowel: Signs of fecal impaction with large burden of stool in the rectum which shows wall thickening despite rectal distension. Perirectal stranding is similar to previous imaging. Perianal stranding also present. Rectum distended up to 9.5 cm. Slightly increased stool in the distal sigmoid. No signs of overt obstruction of upstream loops of bowel currently. Stomach under distended. No signs of small bowel dilation. Vascular/Lymphatic: Aortic atherosclerosis. No sign of aneurysm. Smooth contour of the IVC. There is no gastrohepatic or hepatoduodenal ligament lymphadenopathy. No retroperitoneal or mesenteric lymphadenopathy. No pelvic sidewall lymphadenopathy. Atherosclerotic changes are moderate to marked. Reproductive: Post  hysterectomy. Other: No free air or ascites.  No pneumatosis. Musculoskeletal: Destructive changes about L2-3 with chronic appearance. Levo convexity of the spine and degenerative changes. Marked muscular atrophy. Stage IV decubitus ulceration overlying the RIGHT ischium with sclerotic appearing ischial tuberosity suggesting chronic osteomyelitis. Small amount of gas tracking posterior to the acetabulum and along the RIGHT sacro tuberous ligament. Fluid containing tract extending along the structures previously. Gas in continuity with the skin ulceration and perhaps with some packing material in the wound. Adjacent perianal thickening/stranding in the setting of suspected fecal impaction. No well-formed fluid collection. IMPRESSION: 1. Nephrostomy tube remaining in lower pole collecting systems of the RIGHT kidney, no signs of hydronephrosis with similar appearance of large staghorn calculus and overall similar appearance of distal ureteral calculi, perhaps mild distal migration of the largest calculus. No hydronephrosis. 2. Signs of stercoral proctitis and colitis due to fecal impaction similar to previous imaging. 3. Decubitus ulceration with local extension of fluid in gas along the acetabulum and sacrotuberous ligament. Fluid in these areas previously but no well-formed collection. Findings favored to represent gas tracking from the open wound into these areas but would suggest close attention on follow-up and correlation with direct clinical inspection to ensure no worsening and development of more aggressive soft tissue infection. 4. Aortic atherosclerosis. 5. Nodular hepatic contours raising the question of background liver disease. 6. Contralateral LEFT nephrolithiasis. Aortic Atherosclerosis (ICD10-I70.0). Electronically Signed   By: Zetta Bills M.D.   On: 02/23/2022 14:46    Pertinent labs & imaging results that were available during my care of the patient were reviewed by me and considered in my  medical decision making (see MDM for details).  Medications Ordered in ED Medications  potassium chloride SA (KLOR-CON M) CR tablet 40 mEq (has no administration in time range)  Procedures Procedures  (including critical care time)  Medical Decision Making / ED Course   MDM:  76 year old female presenting with nephrostomy tube issue.  CT scan of the abdomen obtained to further evaluate nephrostomy tube positioning.  Appears to be in the kidney on my review of CT scan.  Will obtain basic lab testing.  Patient has no fevers, chills, CVA tenderness to suggest infectious process.  If nephrostomy tube appears appropriately situated likely discharge back to facility.  Discussed with patient site of partial skin breakdown and need for wound care at her facility, she understands  Clinical Course as of 02/23/22 1519  Wed Feb 23, 2022  1515 1. Nephrostomy tube remaining in lower pole collecting systems of the RIGHT kidney, no signs of hydronephrosis with similar appearance of large staghorn calculus and overall similar appearance of distal ureteral calculi, perhaps mild distal migration of the largest calculus. No hydronephrosis. 2. Signs of stercoral proctitis and colitis due to fecal impaction similar to previous imaging. 3. Decubitus ulceration with local extension of fluid in gas along the acetabulum and sacrotuberous ligament. Fluid in these areas previously but no well-formed collection. Findings favored to represent gas tracking from the open wound into these areas but would suggest close attention on follow-up and correlation with direct clinical inspection to ensure no worsening and development of more aggressive soft tissue infection.   [WS]  1515 Nephrostomy tube is in the right place.  Patient also has some signs of fecal impaction which has been  previously visualized.  Discussed enema or disimpaction and patient refused.  She prefers to continue to try her stool softeners.  Directly visualize decubitus ulceration, wound is packed, patient reports she has regular wound dressing changes which is likely source of the gas tracking.  Patient has no warmth at the site, purulent drainage or elevated WBC count, fever to suggest acute infectious process.  Discharged back to skilled nursing facility.  Discussed need for wound care at nephrostomy site given skin breakdown. [WS]    Clinical Course User Index [WS] Cristie Hem, MD     Additional history obtained: -Additional history obtained from ems -External records from outside source obtained and reviewed including: Chart review including previous notes, labs, imaging, consultation notes including D/C summary from 12/22/21   Lab Tests: -I ordered, reviewed, and interpreted labs.   The pertinent results include:   Labs Reviewed  BASIC METABOLIC PANEL - Abnormal; Notable for the following components:      Result Value   Potassium 3.2 (*)    Glucose, Bld 100 (*)    Creatinine, Ser 0.33 (*)    Calcium 8.7 (*)    All other components within normal limits  CBC WITH DIFFERENTIAL/PLATELET - Abnormal; Notable for the following components:   RBC 3.68 (*)    Hemoglobin 10.3 (*)    HCT 33.6 (*)    RDW 16.2 (*)    All other components within normal limits    Notable for normal WBC count, mild hypokalemia   Imaging Studies ordered: I ordered imaging studies including CT Renal stone survey On my interpretation imaging demonstrates appropriately positioned nephrostomy tube, chronic decubitus ulcer I independently visualized and interpreted imaging. I agree with the radiologist interpretation   Medicines ordered and prescription drug management: Meds ordered this encounter  Medications   potassium chloride SA (KLOR-CON M) CR tablet 40 mEq    -I have reviewed the patients home  medicines and have made adjustments as needed  Social Determinants of Health:  Diagnosis or treatment significantly limited by social determinants of health: obesity   Reevaluation: After the interventions noted above, I reevaluated the patient and found that they have stayed the same  Co morbidities that complicate the patient evaluation  Past Medical History:  Diagnosis Date   Bipolar 1 disorder ([place])    COPD with asthma    Diabetes mellitus without complication ([place])    GERD (gastroesophageal reflux disease)    Hypertension    Obesity    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    Final Clinical Impression(s) / ED Diagnoses Final diagnoses:  Displacement of nephrostomy tube [facility])     This chart was dictated using voice recognition software.  Despite best efforts to proofread,  errors can occur which can change the documentation meaning.    Cristie Hem, MD 02/23/22 [phone]

## 2022-02-23 NOTE — Discharge Instructions (Addendum)
We evaluated your nephrostomy tube.  Your nephrostomy tube appears to be in the correct position.  There is some skin breakdown at the nephrostomy tube site.  This is likely due to lying on the nephrostomy tube.  Please ask your nursing facility to help you turn to your side to reduce pressure on your nephrostomy tube.  You also may benefit from some more supportive dressings which may help relieve pressure on the nephrostomy tube.

## 2022-02-23 NOTE — ED Triage Notes (Signed)
Pt came in by [facility] for due to her nephrostomy tube being pulled out about 3 inches;  Pt had surgery last Wednesday and the tube was pulled out on Monday  Pt c/o some soreness to area

## 2022-03-09 ENCOUNTER — Other Ambulatory Visit: Payer: Self-pay | Admitting: Radiology

## 2022-03-09 ENCOUNTER — Emergency Department (HOSPITAL_COMMUNITY)
Admission: EM | Admit: 2022-03-09 | Discharge: 2022-03-09 | Disposition: A | Discharge status: Skilled Nursing Facility | Payer: Medicare Other | Attending: Emergency Medicine | Admitting: Emergency Medicine

## 2022-03-09 ENCOUNTER — Other Ambulatory Visit: Payer: Self-pay

## 2022-03-09 ENCOUNTER — Encounter (HOSPITAL_COMMUNITY): Payer: Self-pay | Admitting: Emergency Medicine

## 2022-03-09 DIAGNOSIS — Z79899 Other long term (current) drug therapy: Secondary | ICD-10-CM | POA: Insufficient documentation

## 2022-03-09 DIAGNOSIS — I10 Essential (primary) hypertension: Secondary | ICD-10-CM | POA: Diagnosis not present

## 2022-03-09 DIAGNOSIS — J449 Chronic obstructive pulmonary disease, unspecified: Secondary | ICD-10-CM | POA: Diagnosis not present

## 2022-03-09 DIAGNOSIS — T83022A Displacement of nephrostomy catheter, initial encounter: Secondary | ICD-10-CM | POA: Diagnosis not present

## 2022-03-09 DIAGNOSIS — R791 Abnormal coagulation profile: Secondary | ICD-10-CM | POA: Diagnosis not present

## 2022-03-09 DIAGNOSIS — Z436 Encounter for attention to other artificial openings of urinary tract: Secondary | ICD-10-CM | POA: Diagnosis not present

## 2022-03-09 DIAGNOSIS — E119 Type 2 diabetes mellitus without complications: Secondary | ICD-10-CM | POA: Insufficient documentation

## 2022-03-09 LAB — CBC WITH DIFFERENTIAL/PLATELET
Abs Immature Granulocytes: 0.05 10*3/uL (ref 0.00–0.07)
Basophils Absolute: 0.1 10*3/uL (ref 0.0–0.1)
Basophils Relative: 1 %
Eosinophils Absolute: 0.3 10*3/uL (ref 0.0–0.5)
Eosinophils Relative: 3 %
HCT: 34.4 % — ABNORMAL LOW (ref 36.0–46.0)
Hemoglobin: 10.4 g/dL — ABNORMAL LOW (ref 12.0–15.0)
Immature Granulocytes: 1 %
Lymphocytes Relative: 29 %
Lymphs Abs: 2.5 10*3/uL (ref 0.7–4.0)
MCH: 27.8 pg (ref 26.0–34.0)
MCHC: 30.2 g/dL (ref 30.0–36.0)
MCV: 92 fL (ref 80.0–100.0)
Monocytes Absolute: 0.6 10*3/uL (ref 0.1–1.0)
Monocytes Relative: 7 %
Neutro Abs: 5.1 10*3/uL (ref 1.7–7.7)
Neutrophils Relative %: 59 %
Platelets: 275 10*3/uL (ref 150–400)
RBC: 3.74 MIL/uL — ABNORMAL LOW (ref 3.87–5.11)
RDW: 15.8 % — ABNORMAL HIGH (ref 11.5–15.5)
WBC: 8.5 10*3/uL (ref 4.0–10.5)
nRBC: 0 % (ref 0.0–0.2)

## 2022-03-09 LAB — COMPREHENSIVE METABOLIC PANEL
ALT: 18 U/L (ref 0–44)
AST: 28 U/L (ref 15–41)
Albumin: 3.1 g/dL — ABNORMAL LOW (ref 3.5–5.0)
Alkaline Phosphatase: 81 U/L (ref 38–126)
Anion gap: 7 (ref 5–15)
BUN: 19 mg/dL (ref 8–23)
CO2: 24 mmol/L (ref 22–32)
Calcium: 8.9 mg/dL (ref 8.9–10.3)
Chloride: 106 mmol/L (ref 98–111)
Creatinine, Ser: 0.46 mg/dL (ref 0.44–1.00)
GFR, Estimated: >60 mL/min (ref >60)
Glucose, Bld: 100 mg/dL — ABNORMAL HIGH (ref 70–99)
Potassium: 4.7 mmol/L (ref 3.5–5.1)
Sodium: 137 mmol/L (ref 135–145)
Total Bilirubin: 0.5 mg/dL (ref 0.3–1.2)
Total Protein: 6.5 g/dL (ref 6.5–8.1)

## 2022-03-09 LAB — PROTIME-INR
INR: 1.1 (ref 0.8–1.2)
Prothrombin Time: 13.6 seconds (ref 11.4–15.2)

## 2022-03-09 NOTE — ED Notes (Signed)
Unsuccessful attempt to call report to [facility].

## 2022-03-09 NOTE — Discharge Instructions (Addendum)
As we discussed, procedure scheduled for tomorrow morning at noon.  Radiology recommended you getting to [facility] at 10 AM for preprocedural events.  Please do not eat or drink anything after midnight in preparation for this procedure.  Please denies states return to emergency department for worrisome signs and symptoms we discussed become apparent.

## 2022-03-09 NOTE — ED Triage Notes (Signed)
Pt reports nephrostomy tube was removed by CNA. Tube was fully removed. Pt reports she is paralyzed and does not feel any pain.

## 2022-03-09 NOTE — Progress Notes (Signed)
Ms. Fabio PCN was fully dislodged earlier today and was sent to [facility] ED for evaluation.  As patient does not have need for admission, per ED Provider, will have patient scheduled as an outpatient for tomorrow for replacement.  Discussed with Dion Saucier, PA to ask patient to be NPO after midnight and refrain from any anticoagulation until after procedure, who voiced understanding and agreement.   Electronically Signed: Pasty Spillers, PA-C 03/09/2022, 11:54 AM

## 2022-03-09 NOTE — ED Provider Notes (Signed)
[facility] EMERGENCY DEPARTMENT Provider Note   CSN: [id number] Arrival date & time: 03/09/22  8469     History  Chief Complaint  Patient presents with   Nephrostomy removed    [name] is a 76 y.o. female.  HPI   76 year old female presents emergency department with complaints of nephrostomy tube removal.  Patient states that she was being cared for by a CNA earlier this morning around 5 AM when the CNA completely dislodged right-sided nephrostomy tube.  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  Patient is paraplegic and states that she is insensate from the mid abdomen down.   Past medical history significant for paraplegia, hypertension, pyelonephritis, diabetes mellitus, COPD, bipolar 1 disorder.  Home Medications Prior to Admission medications   Medication Sig Start Date End Date Taking? Authorizing Provider  acetaminophen (TYLENOL) 325 MG tablet Take 1 tablet (325 mg total) by mouth every 6 (six) hours as needed for mild pain, moderate pain, fever or headache. *May take one every 6 hours as needed for pain Patient taking differently: Take 650 mg by mouth every 6 (six) hours as needed for mild pain, moderate pain, fever or headache. 12/29/21  Yes Rai, Ripudeep K, MD  Amino Acids-Protein Hydrolys (FEEDING SUPPLEMENT, PRO-STAT 64,) LIQD Take 30 mLs by mouth 3 (three) times daily with meals.   Yes [provider]  atorvastatin (LIPITOR) 10 MG tablet Take 10 mg by mouth every evening.   Yes [provider]  azelastine (ASTELIN) 0.1 % nasal spray Place 2 sprays into both nostrils 2 (two) times daily. Use in each nostril as directed   Yes [provider]  b complex vitamins tablet Take 1 tablet by mouth daily.   Yes [provider]  Calcium Carb-Cholecalciferol (CALCIUM CARBONATE-VITAMIN D3 PO) Take 1 tablet by mouth in the morning and at bedtime.   Yes [provider]  carvedilol  (COREG) 6.25 MG tablet Take 6.25 mg by mouth 2 (two) times daily. Hold for SBP <100 or HR < 60   Yes [provider]  Cranberry 500 MG TABS Take 1 capsule by mouth daily.   Yes [provider]  dextromethorphan-guaiFENesin (MUCINEX DM) 30-600 MG per 12 hr tablet Take 1 tablet by mouth every 12 (twelve) hours as needed for cough.   Yes [provider]  docusate sodium (COLACE) 100 MG capsule Take 200 mg by mouth at bedtime.   Yes [provider]  Eyelid Cleansers (OCUSOFT EYELID CLEANSING) PADS Apply 1 Application topically daily.   Yes [provider]  ezetimibe (ZETIA) 10 MG tablet Take 10 mg by mouth at bedtime.    Yes [provider]  famotidine (PEPCID) 20 MG tablet Take 20 mg by mouth 2 (two) times daily.   Yes [provider]  ferrous sulfate 325 (65 FE) MG EC tablet Take 325 mg by mouth 3 (three) times daily with meals.   Yes [provider]  fluticasone (FLONASE) 50 MCG/ACT nasal spray Place 2 sprays into both nostrils daily.  09/03/14  Yes [provider]  gabapentin (NEURONTIN) 300 MG capsule Take 300 mg by mouth every 8 (eight) hours.   Yes [provider]  ipratropium-albuterol (DUONEB) 0.5-2.5 (3) MG/3ML SOLN Take 3 mLs by nebulization every 6 (six) hours as needed (shortness of breath). 11/19/21  Yes Emokpae, Courage, MD  Lactobacillus (ACIDOPHILUS PO) Take 1 tablet by mouth in the morning and at bedtime.   Yes [provider]  levothyroxine (SYNTHROID) 150 MCG tablet Take 150 mcg by mouth daily.   Yes [provider]  lisinopril (ZESTRIL) 2.5 MG tablet Take 1.25 mg by mouth daily.   Yes [provider]  midodrine (PROAMATINE) 5 MG tablet Take 1 tablet (5 mg total) by mouth 3 (three) times daily with meals. Patient taking differently: Take 5 mg by mouth 2 (two) times daily. 12/29/21  Yes Rai, Ripudeep K, MD  mirabegron ER (MYRBETRIQ) 25 MG TB24 tablet Take 25 mg by mouth  daily.   Yes [provider]  Multiple Vitamins-Minerals (CERTAGEN PO) Take 1 tablet by mouth daily.   Yes [provider]  Nutritional Supplements (ARGINAID) PACK Take 1 packet by mouth daily.   Yes [provider]  olopatadine (PATADAY) 0.1 % ophthalmic solution Place 1 drop into both eyes every 6 (six) hours as needed for allergies.   Yes [provider]  polyethylene glycol (MIRALAX / GLYCOLAX) 17 g packet Take 17 g by mouth daily as needed for moderate constipation. 12/29/21  Yes Rai, Ripudeep K, MD  promethazine (PHENERGAN) 12.5 MG suppository Place 12.5 mg rectally every 12 (twelve) hours as needed for nausea or vomiting.   Yes [provider]  rizatriptan (MAXALT) 10 MG tablet Take 10 mg by mouth daily as needed for migraine.   Yes [provider]  tamsulosin (FLOMAX) 0.4 MG CAPS capsule Take 1 capsule (0.4 mg total) by mouth daily after supper. 11/19/21  Yes Emokpae, Courage, MD  topiramate (TOPAMAX) 50 MG tablet Take 50 mg by mouth 2 (two) times daily.   Yes [provider]  vitamin C (ASCORBIC ACID) 500 MG tablet Take 500 mg by mouth 2 (two) times daily.   Yes [provider]  Vitamin D, Ergocalciferol, (DRISDOL) 1.25 MG (50000 UNIT) CAPS capsule Take 50,000 Units by mouth every Saturday.   Yes [provider]      Allergies    Codeine, Invanz [ertapenem], Propranolol hcl, Rocephin [ceftriaxone], and Vancomycin    Review of Systems   Review of Systems  All other systems reviewed and are negative.   Physical Exam Updated Vital Signs BP (!) 133/58   Pulse 71   Temp 98 F (36.7 C) (Oral)   Resp 16   Ht '5\' 7"'$  (1.702 m)   Wt 107 kg   SpO2 95%   BMI 36.95 kg/m  Physical Exam Vitals and nursing note reviewed.  Constitutional:      General: She is not in acute distress.    Appearance: She is well-developed.  HENT:     Head: Normocephalic and atraumatic.  Eyes:     Conjunctiva/sclera:  Conjunctivae normal.  Cardiovascular:     Rate and Rhythm: Normal rate and regular rhythm.     Heart sounds: No murmur heard. Pulmonary:     Effort: Pulmonary effort is normal. No respiratory distress.     Breath sounds: Normal breath sounds.  Abdominal:     Palpations: Abdomen is soft.     Tenderness: There is no abdominal tenderness.  Musculoskeletal:     Cervical back: Neck supple.  Skin:    General: Skin is warm and dry.     Capillary Refill: Capillary refill takes less than 2 seconds.  Neurological:     Mental Status: She is alert.  Psychiatric:        Mood and Affect: Mood normal.     ED Results / Procedures / Treatments   Labs (all labs ordered are listed,  but only abnormal results are displayed) Labs Reviewed  COMPREHENSIVE METABOLIC PANEL - Abnormal; Notable for the following components:      Result Value   Glucose, Bld 100 (*)    Albumin 3.1 (*)    All other components within normal limits  CBC WITH DIFFERENTIAL/PLATELET - Abnormal; Notable for the following components:   RBC 3.74 (*)    Hemoglobin 10.4 (*)    HCT 34.4 (*)    RDW 15.8 (*)    All other components within normal limits  PROTIME-INR    EKG None  Radiology No results found.  Procedures Procedures    Medications Ordered in ED Medications - No data to display  ED Course/ Medical Decision Making/ A&P Clinical Course as of 03/09/22 1233  Wed Mar 09, 2022  1045 [place] urology Dr. Alyson Ingles.  He is just reaching out to interventional radiology either at [facility] or [place] for placement of right-sided nephrostomy tube. [CR]  1101 Consulted interventional radiology regarding the patient.  Agreed with potential transfer to [facility] or [place] for placement of nephrostomy tube pending availability. [CR]  5974 Consulted interventional radiology again regarding the patient.  To be scheduled tomorrow morning for right-sided nephrostomy tube placement.  N.p.o. after midnight. [CR]  1206  Consulted Dr. Alyson Ingles again of urology.  He agreed with stability of patient to be discharged with IR procedure performed tomorrow. [CR]    Clinical Course User Index [CR] Wilnette Kales, PA                           Medical Decision Making Amount and/or Complexity of Data Reviewed Labs: ordered. Radiology: ordered.   This patient presents to the ED for concern of nephrostomy tube malfunction, this involves an extensive number of treatment options, and is a complaint that carries with it a high risk of complications and morbidity.  The differential diagnosis includes nephrostomy tube malfunction, sepsis, UTI   Co morbidities that complicate the patient evaluation  See HPI   Additional history obtained:  Additional history obtained from EMR External records from outside source obtained and reviewed including hospital records   Lab Tests:  I Ordered, and personally interpreted labs.  The pertinent results include: No leukocytosis noted.  Anemia with hemoglobin 10.4 which is near patient's baseline.  No electrolyte abnormalities noted.  No transaminitis noted.  No renal dysfunction.  PT/INR within normal limits.   Imaging Studies ordered:  N/a   Cardiac Monitoring: / EKG:  The patient was maintained on a cardiac monitor.  I personally viewed and interpreted the cardiac monitored which showed an underlying rhythm of: Sinus rhythm   Consultations Obtained:  See ED course  Problem List / ED Course / Critical interventions / Medication management  Nephrostomy tube malfunction Reevaluation of the patient showed that the patient stayed the same I have reviewed the patients home medicines and have made adjustments as needed   Social Determinants of Health:  Paraplegic.  Nursing home resident.   Test / Admission - Considered:  Nephrostomy tube malfunction  Vitals signs within normal range and stable throughout visit. Laboratory studies significant for: See  above Patient with nephrostomy tube removal by CNA staff.  Consulted IR and urology multiple times regarding the patient.  Agreed with discharging the patient given stable condition with IR procedure scheduled tomorrow morning at noon.  Information was relayed on to patient which she was understandable and agreeable.  Patient again advised to  remain n.p.o. after midnight for upcoming procedure.  Treatment plan discussed with patient and she acknowledged understanding was agreeable to said plan. Worrisome signs and symptoms were discussed with the patient, and the patient acknowledged understanding to return to the ED if noticed. Patient was stable upon discharge.          Final Clinical Impression(s) / ED Diagnoses Final diagnoses:  Nephrostomy tube displaced [facility])    Rx / DC Orders ED Discharge Orders     None         Wilnette Kales, [place] 03/09/22 1233    Milton Ferguson, MD 03/09/22 1757

## 2022-03-09 NOTE — ED Notes (Signed)
Unsuccessful attempt to call report to [facility].

## 2022-03-09 NOTE — ED Notes (Signed)
After 2 unsuccessful attempts to call report to [facility], RN called from another number and facility answered. Currently placed on hold.

## 2022-03-09 NOTE — ED Notes (Signed)
Patient reported to ED with foley catheter in place. [facility] did not facilitate any procedures with foley catheter

## 2022-03-10 ENCOUNTER — Telehealth: Payer: Self-pay

## 2022-03-10 ENCOUNTER — Encounter (HOSPITAL_COMMUNITY): Payer: Self-pay

## 2022-03-10 ENCOUNTER — Inpatient Hospital Stay (HOSPITAL_COMMUNITY): Admit: 2022-03-10 | Discharge: 2022-03-10 | Disposition: A | Discharge status: Home / Self Care | Payer: Medicare Other

## 2022-03-10 ENCOUNTER — Ambulatory Visit (HOSPITAL_COMMUNITY)
Admission: RE | Admit: 2022-03-10 | Discharge: 2022-03-10 | Disposition: A | Discharge status: Home / Self Care | Payer: Medicare Other | Source: Ambulatory Visit | Attending: Physician Assistant | Admitting: Physician Assistant

## 2022-03-10 ENCOUNTER — Other Ambulatory Visit (HOSPITAL_COMMUNITY): Payer: Self-pay | Admitting: Physician Assistant

## 2022-03-10 DIAGNOSIS — N049 Nephrotic syndrome with unspecified morphologic changes: Secondary | ICD-10-CM | POA: Insufficient documentation

## 2022-03-10 DIAGNOSIS — N201 Calculus of ureter: Secondary | ICD-10-CM

## 2022-03-10 DIAGNOSIS — Z436 Encounter for attention to other artificial openings of urinary tract: Secondary | ICD-10-CM | POA: Diagnosis present

## 2022-03-10 HISTORY — PX: IR NEPHROSTOMY EXCHANGE RIGHT: IMG6070

## 2022-03-10 MED ORDER — IOHEXOL 300 MG/ML  SOLN
50.0000 mL | Freq: Once | INTRAMUSCULAR | Status: AC | PRN
  Administered 2022-03-10: 10 mL

## 2022-03-10 MED ORDER — LIDOCAINE HCL 1 % IJ SOLN
INTRAMUSCULAR | Status: AC
  Filled 2022-03-10: qty 20

## 2022-03-10 MED ORDER — LEVOFLOXACIN IN D5W 500 MG/100ML IV SOLN
500.0000 mg | Freq: Once | INTRAVENOUS | Status: AC
  Administered 2022-03-10: 500 mg via INTRAVENOUS
  Filled 2022-03-10 (×2): qty 100

## 2022-03-10 NOTE — Telephone Encounter (Signed)
Patient was transferred from [name] to IR because her Nephrostomy tube had come out at the facility. They wanted to confirm with Dr. Hal Neer if they are replace the nephrostomy tube or if he had different instructions based on the plan from 02/16/2022.  Verbal from Dr. Felipa Eth to replace the nephrostomy tube and have the patient follow up as scheduled.  I spoke with the PA Pam and informed her of MD's response and let her know if they had any questions he will reach out to them.  No further questions at this time.  Her cell call back number [phone].

## 2022-03-10 NOTE — H&P (Signed)
Chief Complaint: Patient was seen in consultation today for replacement right percutaneous nephrostomy tube at the request of Dr Alvie Heidelberg  Referring Physician(s): Robbins,Cooper A Dr Alvie Heidelberg  Supervising Physician: Mir, Sharen Heck  Patient Status: [facility] - Out-pt  History of Present Illness: [name] is a 76 y.o. 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 Rt PCN placed in IR 12/24/21 secondary stone and hydronephrosis Exch 02/16/22 in IR Was discussed with Dr Felipa Eth and Dr Pascal Lux possible right nephroureteral stent and SP catheter at some point  I have spoken to [place] with Dr Felipa Eth today He does recollect the conversation with Dr Pascal Lux Dr Felipa Eth recommends replacement of Rt PCN replacement today- only  Pt is to transition to care with Dr Alyson Ingles as of tomorrow 03/11/22 Plan per Dr Alyson Ingles at that point  Pt is aware and agreeable    Past Medical History:  Diagnosis Date   Bipolar 1 disorder ([place])    COPD with asthma    Diabetes mellitus without complication ([place])    GERD (gastroesophageal reflux disease)    Hypertension    Obesity    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 Authorizing Provider  acetaminophen (TYLENOL) 325 MG tablet Take 1 tablet (325 mg total) by mouth every 6 (six) hours as needed for mild pain, moderate pain, fever or headache. *May take one every 6 hours as needed for pain Patient taking differently: Take 650 mg by mouth every 6 (six) hours as needed for mild pain, moderate pain, fever or  headache. 12/29/21   Rai, Ripudeep Raliegh Ip, MD  Amino Acids-Protein Hydrolys (FEEDING SUPPLEMENT, PRO-STAT 64,) LIQD Take 30 mLs by mouth 3 (three) times daily with meals.    [provider]  atorvastatin (LIPITOR) 10 MG tablet Take 10 mg by mouth every evening.    [provider]  azelastine (ASTELIN) 0.1 % nasal spray Place 2 sprays into both nostrils 2 (two) times daily. Use in each nostril as directed    [provider]  b complex vitamins tablet Take 1 tablet by mouth daily.    [provider]  Calcium Carb-Cholecalciferol (CALCIUM CARBONATE-VITAMIN D3 PO) Take 1 tablet by mouth in the morning and at bedtime.    [provider]  carvedilol (COREG) 6.25 MG tablet Take 6.25 mg by mouth 2 (two) times daily. Hold for SBP <100 or HR < 60    [provider]  Cranberry 500 MG TABS Take 1 capsule by mouth daily.    [provider]  dextromethorphan-guaiFENesin (MUCINEX DM) 30-600 MG per 12 hr tablet Take 1 tablet by mouth every 12 (twelve) hours as needed for cough.    [provider]  docusate sodium (COLACE) 100 MG capsule Take 200 mg by mouth at bedtime.    [provider]  Eyelid Cleansers (OCUSOFT EYELID CLEANSING) PADS Apply 1 Application topically daily.    [provider]  ezetimibe (ZETIA) 10 MG tablet Take 10 mg by mouth at bedtime.     [provider]  famotidine (PEPCID) 20  MG tablet Take 20 mg by mouth 2 (two) times daily.    [provider]  ferrous sulfate 325 (65 FE) MG EC tablet Take 325 mg by mouth 3 (three) times daily with meals.    [provider]  fluticasone (FLONASE) 50 MCG/ACT nasal spray Place 2 sprays into both nostrils daily.  09/03/14   [provider]  gabapentin (NEURONTIN) 300 MG capsule Take 300 mg by mouth every 8 (eight) hours.    [provider]  ipratropium-albuterol (DUONEB) 0.5-2.5 (3) MG/3ML SOLN Take 3 mLs by nebulization every 6 (six)  hours as needed (shortness of breath). 11/19/21   Roxan Hockey, MD  Lactobacillus (ACIDOPHILUS PO) Take 1 tablet by mouth in the morning and at bedtime.    [provider]  levothyroxine (SYNTHROID) 150 MCG tablet Take 150 mcg by mouth daily.    [provider]  lisinopril (ZESTRIL) 2.5 MG tablet Take 1.25 mg by mouth daily.    [provider]  midodrine (PROAMATINE) 5 MG tablet Take 1 tablet (5 mg total) by mouth 3 (three) times daily with meals. Patient taking differently: Take 5 mg by mouth 2 (two) times daily. 12/29/21   Rai, Ripudeep Raliegh Ip, MD  mirabegron ER (MYRBETRIQ) 25 MG TB24 tablet Take 25 mg by mouth daily.    [provider]  Multiple Vitamins-Minerals (CERTAGEN PO) Take 1 tablet by mouth daily.    [provider]  Nutritional Supplements (ARGINAID) PACK Take 1 packet by mouth daily.    [provider]  olopatadine (PATADAY) 0.1 % ophthalmic solution Place 1 drop into both eyes every 6 (six) hours as needed for allergies.    [provider]  polyethylene glycol (MIRALAX / GLYCOLAX) 17 g packet Take 17 g by mouth daily as needed for moderate constipation. 12/29/21   Rai, Vernelle Emerald, MD  promethazine (PHENERGAN) 12.5 MG suppository Place 12.5 mg rectally every 12 (twelve) hours as needed for nausea or vomiting.    [provider]  rizatriptan (MAXALT) 10 MG tablet Take 10 mg by mouth daily as needed for migraine.    [provider]  tamsulosin (FLOMAX) 0.4 MG CAPS capsule Take 1 capsule (0.4 mg total) by mouth daily after supper. 11/19/21   Roxan Hockey, MD  topiramate (TOPAMAX) 50 MG tablet Take 50 mg by mouth 2 (two) times daily.    [provider]  vitamin C (ASCORBIC ACID) 500 MG tablet Take 500 mg by mouth 2 (two) times daily.    [provider]  Vitamin D, Ergocalciferol, (DRISDOL) 1.25 MG (50000 UNIT) CAPS capsule Take 50,000 Units by mouth every Saturday.    [provider]      Family History  Adopted: Yes  Problem Relation Age of Onset   Heart disease Mother     Social History   Socioeconomic History   Marital status: Single    Spouse name: Not on file   Number of children: Not on file   Years of education: Not on file   Highest education level: Not on file  Occupational History   Not on file  Tobacco Use   Smoking status: Former   Smokeless tobacco: Never  Substance and Sexual Activity   Alcohol use: Not Currently    Alcohol/week: 0.0 standard drinks of alcohol    Comment: "weekend drinker, Saturday night drunk" years ago   Drug use: No   Sexual activity: Never  Other Topics Concern   Not on file  Social History Narrative  Not on file   Social Determinants of Health   Financial Resource Strain: Not on file  Food Insecurity: Unknown (12/29/2021)   Hunger Vital Sign    Worried About Running Out of Food in the Last Year: Patient refused    [place] in the Last Year: Patient refused  Transportation Needs: No Transportation Needs (12/29/2021)   PRAPARE - Hydrologist (Medical): No    Lack of Transportation (Non-Medical): No  Physical Activity: Not on file  Stress: Not on file  Social Connections: Not on file    Review of Systems: A 12 point ROS discussed and pertinent positives are indicated in the HPI above.  All other systems are negative.  Review of Systems  Constitutional:  Negative for activity change and fever.  Respiratory:  Negative for cough and shortness of breath.   Gastrointestinal:  Negative for abdominal pain.  Psychiatric/Behavioral:  Negative for behavioral problems and confusion.     Vital Signs: There were no vitals taken for this visit.   Physical Exam Vitals reviewed.  HENT:     Mouth/Throat:     Mouth: Mucous membranes are moist.  Cardiovascular:     Rate and Rhythm: Normal rate and regular rhythm.     Heart sounds: Normal heart sounds.  Pulmonary:     Effort:  Pulmonary effort is normal.     Breath sounds: Normal breath sounds.  Abdominal:     Palpations: Abdomen is soft.  Skin:    General: Skin is warm.  Neurological:     Mental Status: She is alert and oriented to person, place, and time.  Psychiatric:        Behavior: Behavior normal.     Imaging: CT Renal Stone Study  Result Date: 02/23/2022 CLINICAL DATA:  Nephrostomy tube catheter pulled out about 3 inches in a patient with large renal calculus. EXAM: CT ABDOMEN AND PELVIS WITHOUT CONTRAST TECHNIQUE: Multidetector CT imaging of the abdomen and pelvis was performed following the standard protocol without IV contrast. RADIATION DOSE REDUCTION: This exam was performed according to the departmental dose-optimization program which includes automated exposure control, adjustment of the mA and/or kV according to patient size and/or use of iterative reconstruction technique. COMPARISON:  January 31, 2022 FINDINGS: Lower chest: Slightly improved aeration at the RIGHT lung base still with patchy ground-glass nodularity, volume loss and consolidative changes. These are mild and improved since previous imaging. No pleural effusion. Mild LEFT basilar atelectasis. Hepatobiliary: Nodular hepatic contour with signs of fissural widening. Sludge in the gallbladder. No pericholecystic stranding. No gross biliary duct dilation. 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 No sign of pancreatic inflammation. Spleen: Mild splenomegaly unchanged. Adrenals/Urinary Tract: Adrenal glands are normal. Marked perinephric stranding. Nephrostomy tube in place with marker for sideholes within the parenchyma of the interpolar RIGHT kidney, not extending beyond the renal parenchyma. Marker for sideholes with similar appearance in terms of positioning compared to previous imaging. Pigtail portion is coiled within lower pole collecting system elements similar to previous imaging. Large branch type calculus with lamellated appearance  extends into the UPJ from the renal sinus measuring 3.7 x 2.7 cm similar to prior imaging. Renal cysts and marked RIGHT renal cortical atrophy are similar to prior imaging as well. LEFT renal lesions are unchanged including an intermediate density lesion arising from the lower pole measuring approximately 1.7 cm, this area shown to represent a Bosniak category II cyst on prior imaging. LEFT renal cysts are stable. No  dedicated follow-up imaging is suggested for renal cysts. Urinary bladder is collapsed with Foley catheter in place. There is no hydronephrosis. Distal RIGHT ureteral calculi with similar appearance, largest measuring 11 mm just proximal to the RIGHT UVJ. Two additional calculi present in the RIGHT ureter. The largest 11 mm calculus may have progressed slightly towards the UVJ since previous imaging. Stomach/Bowel: Signs of fecal impaction with large burden of stool in the rectum which shows wall thickening despite rectal distension. Perirectal stranding is similar to previous imaging. Perianal stranding also present. Rectum distended up to 9.5 cm. Slightly increased stool in the distal sigmoid. No signs of overt obstruction of upstream loops of bowel currently. Stomach under distended. No signs of small bowel dilation. Vascular/Lymphatic: Aortic atherosclerosis. No sign of aneurysm. Smooth contour of the IVC. There is no gastrohepatic or hepatoduodenal ligament lymphadenopathy. No retroperitoneal or mesenteric lymphadenopathy. No pelvic sidewall lymphadenopathy. Atherosclerotic changes are moderate to marked. Reproductive: Post hysterectomy. Other: No free air or ascites.  No pneumatosis. Musculoskeletal: Destructive changes about L2-3 with chronic appearance. Levo convexity of the spine and degenerative changes. Marked muscular atrophy. Stage IV decubitus ulceration overlying the RIGHT ischium with sclerotic appearing ischial tuberosity suggesting chronic osteomyelitis. Small amount of gas tracking  posterior to the acetabulum and along the RIGHT sacro tuberous ligament. Fluid containing tract extending along the structures previously. Gas in continuity with the skin ulceration and perhaps with some packing material in the wound. Adjacent perianal thickening/stranding in the setting of suspected fecal impaction. No well-formed fluid collection. IMPRESSION: 1. Nephrostomy tube remaining in lower pole collecting systems of the RIGHT kidney, no signs of hydronephrosis with similar appearance of large staghorn calculus and overall similar appearance of distal ureteral calculi, perhaps mild distal migration of the largest calculus. No hydronephrosis. 2. Signs of stercoral proctitis and colitis due to fecal impaction similar to previous imaging. 3. Decubitus ulceration with local extension of fluid in gas along the acetabulum and sacrotuberous ligament. Fluid in these areas previously but no well-formed collection. Findings favored to represent gas tracking from the open wound into these areas but would suggest close attention on follow-up and correlation with direct clinical inspection to ensure no worsening and development of more aggressive soft tissue infection. 4. Aortic atherosclerosis. 5. Nodular hepatic contours raising the question of background liver disease. 6. Contralateral LEFT nephrolithiasis. Aortic Atherosclerosis (ICD10-I70.0). Electronically Signed   By: Zetta Bills M.D.   On: 02/23/2022 14:46   IR NEPHROSTOMY EXCHANGE LEFT  Result Date: 02/16/2022 INDICATION: History of paraplegia right-sided renal stones, post image guided placement of right-sided nephrostomy catheter/20 11/2021 Patient presents today for antegrade nephrostogram and nephrostomy catheter exchange. EXAM: FLUOROSCOPIC GUIDED RIGHT SIDED NEPHROSTOMY CATHETER EXCHANGE COMPARISON:  CT abdomen pelvis-01/31/2022; 12/23/2021 Image guided placement of right-sided percutaneous nephrostomy catheter-12/24/2021 CONTRAST:  20 cc  Omnipaque 300 administered into the collecting system FLUOROSCOPY TIME:  1 minute, 30 seconds (34 mGy) COMPLICATIONS: None immediate. TECHNIQUE: Informed written consent was obtained from the patient after a discussion of the risks, benefits and alternatives to treatment. Questions regarding the procedure were encouraged and answered. A timeout was performed prior to the initiation of the procedure. The right flank and external portion of existing nephrostomy catheter were prepped and draped in the usual sterile fashion. A sterile drape was applied covering the operative field. Maximum barrier sterile technique with sterile gowns and gloves were used for the procedure. A timeout was performed prior to the initiation of the procedure. A pre procedural spot fluoroscopic image was obtained  after contrast was injected via the existing nephrostomy catheter demonstrating appropriate positioning within the renal pelvis. Dedicated right-sided antegrade nephrostogram was performed. The existing nephrostomy catheter was cut and cannulated with a Benson wire which was coiled within the renal pelvis. Under intermittent fluoroscopic guidance, the existing nephrostomy catheter was exchanged for a new 10.2 [place] all-purpose drainage catheter. Contrast injection confirmed appropriate positioning within the renal pelvis and a post exchange fluoroscopic image was obtained. The catheter was locked and secured to the skin with a suture. A dressing was placed. The patient tolerated the procedure well without immediate postprocedural complication. FINDINGS: The existing nephrostomy catheter is appropriately functioning though partially retracted into the accessed renal calyx. There is a persistent large (at least 1.8 x 1.3 cm) stone at the level of the left renal pelvis as well as several (approximately 5) nonobstructing stones within the mid aspect of the left ureter though is passage of contrast through the ureter to the level of the  urinary bladder. After fluoroscopic guided exchange, the new 10 French nephrostomy catheter is more ideally positioned with end coiled and locked of the level of the left renal pelvis. IMPRESSION: 1. Successful fluoroscopic guided exchange of left sided 10.2 French percutaneous nephrostomy catheter. 2. Similar appearance of large (at least 1.8 cm) stone at the level of the left pelvis. 3. Approximately 5 nonobstructing left-sided ureteral stones with passage of contrast to the level of the urinary bladder. PLAN: - Conversion to a left-sided nephroureteral catheter may be considered at the time of the patient's next fluoroscopic guided exchange at the discretion of Dr. Felipa Eth. - Additionally, given patient's persistent leaking from the chronic Foley catheter with associated sacral decubitus ulcer, consideration for image guided placement of a suprapubic catheter could be performed as indicated. Electronically Signed   By: Sandi Mariscal M.D.   On: 02/16/2022 17:15    Labs:  CBC: Recent Labs    12/29/21 0424 02/16/22 1350 02/23/22 1358 03/09/22 1109  WBC 9.2 9.9 9.6 8.5  HGB 8.1* 10.5* 10.3* 10.4*  HCT 26.5* 32.9* 33.6* 34.4*  PLT 266 248 233 275    COAGS: Recent Labs    11/14/21 2053 11/15/21 0352 12/24/21 0244 02/16/22 1350 03/09/22 1109  INR 1.4* 1.5* 1.4* 1.1 1.1  APTT 48*  --   --   --   --     BMP: Recent Labs    12/29/21 0424 02/16/22 1350 02/23/22 1358 03/09/22 1109  NA 134* 141 139 137  K 4.5 4.0 3.2* 4.7  CL 106 111 110 106  CO2 20* '24 22 24  '$ GLUCOSE 82 81 100* 100*  BUN '13 12 14 19  '$ CALCIUM 8.3* 8.1* 8.7* 8.9  CREATININE 0.63 0.39* 0.33* 0.46  GFRNONAA >60 >60 >60 >60    LIVER FUNCTION TESTS: Recent Labs    11/15/21 0352 11/18/21 0310 12/22/21 1342 03/09/22 1109  BILITOT 0.7 0.6 1.0 0.5  AST 50* 46* 29 28  ALT 32 40 21 18  ALKPHOS 130* 101 146* 81  PROT 5.9* 5.2* 6.8 6.5  ALBUMIN 2.3* 2.0* 2.2* 3.1*    TUMOR MARKERS: No results for input(s):  "AFPTM", "CEA", "CA199", "CHROMGRNA" in the last 8760 hours.  Assessment and Plan:  Scheduled today for Rt Percutaneous nephrostomy replacement per Dr Jacinto Reap Felipa Eth Pt is to see and transition care to Dr Thera Flake 03/11/22 She is aware of procedure benefits and risks including but not limited to Infection; organ damage and damage to surrounding structures She is agreeable to proceed  Consent signed and in chart   Thank you for this interesting consult.  I greatly enjoyed meeting [name] and look forward to participating in their care.  A copy of this report was sent to the requesting provider on this date.  Electronically Signed: Lavonia Drafts, PA-C 03/10/2022, 10:29 AM   I spent a total of    25 Minutes in face to face in clinical consultation, greater than 50% of which was counseling/coordinating care for right PCN replacement

## 2022-03-11 ENCOUNTER — Ambulatory Visit: Payer: Medicare Other | Admitting: Urology

## 2022-03-12 LAB — AEROBIC/ANAEROBIC CULTURE W GRAM STAIN (SURGICAL/DEEP WOUND)

## 2022-03-17 ENCOUNTER — Other Ambulatory Visit (HOSPITAL_COMMUNITY): Payer: Self-pay | Admitting: Physician Assistant

## 2022-03-17 ENCOUNTER — Telehealth (HOSPITAL_COMMUNITY): Payer: Self-pay | Admitting: Physician Assistant

## 2022-03-17 ENCOUNTER — Other Ambulatory Visit (HOSPITAL_COMMUNITY): Payer: Self-pay | Admitting: Interventional Radiology

## 2022-03-17 DIAGNOSIS — Z01818 Encounter for other preprocedural examination: Secondary | ICD-10-CM

## 2022-03-17 DIAGNOSIS — A419 Sepsis, unspecified organism: Secondary | ICD-10-CM

## 2022-03-17 DIAGNOSIS — N200 Calculus of kidney: Secondary | ICD-10-CM

## 2022-03-17 MED ORDER — LEVOFLOXACIN IN D5W 500 MG/100ML IV SOLN: 500.0000 mg | INTRAVENOUS | Status: AC

## 2022-03-17 NOTE — Progress Notes (Signed)
Patient ID: IJEOMA LOOR, female   DOB: Apr 01, 1945, 76 y.o.   MRN: [id number]  Received call from facility that Ms. Cumber's PCN had fallen out once again. The nursing supervisor said when the dressing was removed from the patient's back the tube was curled up in the gauze.   As this has been placed 3 times in the last month, discussed dilemma with Dr. Pascal Lux, and plan was made for Nephro-ureteral tube placement in hopes of having better anchorage in the urinary tract, greatly reducing the chance for the tube to fall out on its own.    Patient has been scheduled for 1:30pm on 12/22pm.  Conversation had with Allyn Kenner at [facility] who will set up transportation for a 11:30 arrival time.  Pt will be NPO after midnight.  She is not on thinners per Remo Lipps and not on oral diabetic medications.  Appropriate orders will be placed.   Electronically Signed: Pasty Spillers, PA-C 03/17/2022, 3:43 PM

## 2022-03-18 ENCOUNTER — Ambulatory Visit (HOSPITAL_COMMUNITY)
Admission: RE | Admit: 2022-03-18 | Discharge: 2022-03-18 | Disposition: A | Discharge status: Home / Self Care | Payer: Medicare Other | Source: Ambulatory Visit | Attending: Interventional Radiology | Admitting: Interventional Radiology

## 2022-03-18 NOTE — Progress Notes (Addendum)
Christene Slates from [facility] phoned [facility] IR APP office today to inform the team that the patient was not able to be transported to [facility] IR for image-guided nephroureteral catheter placement today at her 1:30 PM appointment time due to lack of medical transport availability.   T[place] originally informed me that an appointment on 12/27 would be feasible. An arrival time of 1130AM on 12/27 was coordinated with [place]. A short while later, Belinda called and informed me that this date would no longer work and that 12/29 was the earliest date that transport could be arranged. An appointment time of 10AM with an arrival time to Short Stay at [facility] was arranged and communicated to [facility]. She voiced her understanding and stated that the patient will be transported to arrive at the agreed upon time.  [place] was informed that the patient will need to be NPO at midnight on 12/29, and she informed this provider that the patient does not take blood thinners or oral diabetic medications. Appropriate orders will be placed.  Lura Em, PA-C 03/18/2022 2:49 PM

## 2022-03-23 ENCOUNTER — Ambulatory Visit (HOSPITAL_COMMUNITY): Payer: Medicare Other

## 2022-03-25 ENCOUNTER — Other Ambulatory Visit: Payer: Self-pay | Admitting: Radiology

## 2022-03-25 ENCOUNTER — Ambulatory Visit (HOSPITAL_COMMUNITY)
Admission: RE | Admit: 2022-03-25 | Discharge: 2022-03-25 | Disposition: A | Discharge status: Home / Self Care | Payer: Medicare Other | Source: Ambulatory Visit | Attending: Interventional Radiology | Admitting: Interventional Radiology

## 2022-03-25 ENCOUNTER — Encounter (HOSPITAL_COMMUNITY): Payer: Self-pay

## 2022-03-25 ENCOUNTER — Other Ambulatory Visit: Payer: Self-pay

## 2022-03-25 VITALS — BP 125/63 | HR 89 | Temp 98.3°F | Resp 15 | Ht 67.0 in | Wt 230.0 lb

## 2022-03-25 DIAGNOSIS — I10 Essential (primary) hypertension: Secondary | ICD-10-CM | POA: Insufficient documentation

## 2022-03-25 DIAGNOSIS — N132 Hydronephrosis with renal and ureteral calculous obstruction: Secondary | ICD-10-CM | POA: Insufficient documentation

## 2022-03-25 DIAGNOSIS — K219 Gastro-esophageal reflux disease without esophagitis: Secondary | ICD-10-CM | POA: Diagnosis not present

## 2022-03-25 DIAGNOSIS — J449 Chronic obstructive pulmonary disease, unspecified: Secondary | ICD-10-CM | POA: Insufficient documentation

## 2022-03-25 DIAGNOSIS — Y848 Other medical procedures as the cause of abnormal reaction of the patient, or of later complication, without mention of misadventure at the time of the procedure: Secondary | ICD-10-CM | POA: Diagnosis not present

## 2022-03-25 DIAGNOSIS — G8220 Paraplegia, unspecified: Secondary | ICD-10-CM | POA: Diagnosis not present

## 2022-03-25 DIAGNOSIS — N319 Neuromuscular dysfunction of bladder, unspecified: Secondary | ICD-10-CM | POA: Diagnosis not present

## 2022-03-25 DIAGNOSIS — T83022A Displacement of nephrostomy catheter, initial encounter: Secondary | ICD-10-CM | POA: Insufficient documentation

## 2022-03-25 DIAGNOSIS — N200 Calculus of kidney: Secondary | ICD-10-CM

## 2022-03-25 DIAGNOSIS — N12 Tubulo-interstitial nephritis, not specified as acute or chronic: Secondary | ICD-10-CM

## 2022-03-25 DIAGNOSIS — E119 Type 2 diabetes mellitus without complications: Secondary | ICD-10-CM | POA: Diagnosis not present

## 2022-03-25 DIAGNOSIS — Z01818 Encounter for other preprocedural examination: Secondary | ICD-10-CM

## 2022-03-25 DIAGNOSIS — A419 Sepsis, unspecified organism: Secondary | ICD-10-CM

## 2022-03-25 DIAGNOSIS — N131 Hydronephrosis with ureteral stricture, not elsewhere classified: Secondary | ICD-10-CM

## 2022-03-25 HISTORY — PX: IR NEPHROURETERAL CATH PLACE RIGHT: IMG6066

## 2022-03-25 LAB — CBC
HCT: 35.3 % — ABNORMAL LOW (ref 36.0–46.0)
Hemoglobin: 10.9 g/dL — ABNORMAL LOW (ref 12.0–15.0)
MCH: 28 pg (ref 26.0–34.0)
MCHC: 30.9 g/dL (ref 30.0–36.0)
MCV: 90.7 fL (ref 80.0–100.0)
Platelets: 250 10*3/uL (ref 150–400)
RBC: 3.89 MIL/uL (ref 3.87–5.11)
RDW: 14.6 % (ref 11.5–15.5)
WBC: 5.9 10*3/uL (ref 4.0–10.5)
nRBC: 0 % (ref 0.0–0.2)

## 2022-03-25 LAB — PROTIME-INR
INR: 1.1 (ref 0.8–1.2)
Prothrombin Time: 14.2 seconds (ref 11.4–15.2)

## 2022-03-25 MED ORDER — SODIUM CHLORIDE 0.9 % IV SOLN: INTRAVENOUS | Status: DC

## 2022-03-25 MED ORDER — SODIUM CHLORIDE 0.9% FLUSH: 5.0000 mL | Freq: Three times a day (TID) | INTRAVENOUS | Status: DC

## 2022-03-25 MED ORDER — LIDOCAINE HCL 1 % IJ SOLN
INTRAMUSCULAR | Status: AC
  Filled 2022-03-25: qty 20

## 2022-03-25 MED ORDER — IOHEXOL 300 MG/ML  SOLN
100.0000 mL | Freq: Once | INTRAMUSCULAR | Status: AC | PRN
  Administered 2022-03-25: 20 mL

## 2022-03-25 MED ORDER — LEVOFLOXACIN IN D5W 500 MG/100ML IV SOLN
500.0000 mg | Freq: Once | INTRAVENOUS | Status: AC
  Administered 2022-03-25: 500 mg via INTRAVENOUS
  Filled 2022-03-25: qty 100

## 2022-03-25 NOTE — Discharge Instructions (Signed)
Flush with 10 mL of normal saline twice a week.

## 2022-03-25 NOTE — H&P (Signed)
Chief Complaint: Right nephrolithiasis with hydronephrosis. Right nephrostomy tube fell out. Patient presents for right nephrostomy tube placement  Referring Physician(s): Dr. Felipa Eth  Supervising Physician: Mir, Sharen Heck  Patient Status: [place]  History of Present Illness: [name] is a 76 y.o. female outpatient. Known to IR. History of GERD, HTN, DM, COPD, paraplegia, neurogenic bladder  s/p foley catheter and large right nephrolithiasis s/p right nephrostomy placement 9.29.23. IR placed a right sided nephrostomy tube on 9.23.23 due to sepsis caused but an obstructing right sided renal calculi. The nephrostomy tube has dislodged or fallen out several times. Facility contacted IR to report the right nephrostomy tube had fallen out again. Patient presents for right nephrostomy tube possible nephroureteral placement.  [name] presents to [facility] today in her usual state of health.  She is alert and oriented.  Although she was expecting a routine exchange, she is aware of the difficulty in managing her exchanges, particularly with frequent inadvertent removal.  After discussed she is agreeable to procedure with replacement vs. New placement vs. Nephroureteral stent placement.   She has not been able to make her appointment with Dr. Alyson Ingles [facility]).   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 Authorizing Provider  acetaminophen (TYLENOL) 325 MG tablet Take 1 tablet (325 mg total) by mouth every 6 (six) hours as needed for mild pain, moderate pain, fever or headache. *May take one every 6 hours as needed for pain Patient taking differently: Take 650 mg by mouth every 6 (six) hours as needed for mild pain, moderate pain, fever or headache. 12/29/21   Rai, Ripudeep Raliegh Ip, MD  Amino Acids-Protein Hydrolys (FEEDING SUPPLEMENT, PRO-STAT 64,) LIQD Take 30 mLs by mouth 3 (three) times daily with meals.    [provider]  atorvastatin (LIPITOR) 10 MG tablet Take 10 mg by mouth every evening.    [provider]  azelastine (ASTELIN) 0.1 % nasal spray Place 2 sprays into both nostrils 2 (two) times daily. Use in each nostril as directed    [provider]  b complex vitamins tablet Take 1 tablet by mouth daily.    [provider]  Calcium Carb-Cholecalciferol (CALCIUM CARBONATE-VITAMIN D3 PO) Take 1 tablet by mouth in the morning and at bedtime.    [provider]  carvedilol (COREG) 6.25 MG tablet Take 6.25 mg by mouth 2 (two) times daily. Hold for SBP <100 or HR < 60    [provider]  Cranberry 500 MG TABS Take 1 capsule by mouth daily.    [provider]  dextromethorphan-guaiFENesin (MUCINEX DM) 30-600 MG per 12 hr tablet Take 1 tablet by mouth every 12 (twelve) hours as needed for cough.    [provider]  docusate sodium (COLACE)  100 MG capsule Take 200 mg by mouth at bedtime.    [provider]  Eyelid Cleansers (OCUSOFT EYELID CLEANSING) PADS Apply 1 Application topically daily.    [provider]  ezetimibe (ZETIA) 10 MG tablet Take 10 mg by mouth at bedtime.     [provider]  famotidine (PEPCID) 20 MG tablet Take 20 mg by mouth 2 (two) times daily.    [provider]  ferrous  sulfate 325 (65 FE) MG EC tablet Take 325 mg by mouth 3 (three) times daily with meals.    [provider]  fluticasone (FLONASE) 50 MCG/ACT nasal spray Place 2 sprays into both nostrils daily.  09/03/14   [provider]  gabapentin (NEURONTIN) 300 MG capsule Take 300 mg by mouth every 8 (eight) hours.    [provider]  ipratropium-albuterol (DUONEB) 0.5-2.5 (3) MG/3ML SOLN Take 3 mLs by nebulization every 6 (six) hours as needed (shortness of breath). 11/19/21   Roxan Hockey, MD  Lactobacillus (ACIDOPHILUS PO) Take 1 tablet by mouth in the morning and at bedtime.    [provider]  levothyroxine (SYNTHROID) 150 MCG tablet Take 150 mcg by mouth daily.    [provider]  lisinopril (ZESTRIL) 2.5 MG tablet Take 1.25 mg by mouth daily.    [provider]  midodrine (PROAMATINE) 5 MG tablet Take 1 tablet (5 mg total) by mouth 3 (three) times daily with meals. Patient taking differently: Take 5 mg by mouth 2 (two) times daily. 12/29/21   Rai, Ripudeep Raliegh Ip, MD  mirabegron ER (MYRBETRIQ) 25 MG TB24 tablet Take 25 mg by mouth daily.    [provider]  Multiple Vitamins-Minerals (CERTAGEN PO) Take 1 tablet by mouth daily.    [provider]  Nutritional Supplements (ARGINAID) PACK Take 1 packet by mouth daily.    [provider]  olopatadine (PATADAY) 0.1 % ophthalmic solution Place 1 drop into both eyes every 6 (six) hours as needed for allergies.    [provider]  polyethylene glycol (MIRALAX / GLYCOLAX) 17 g packet Take 17 g by mouth daily as needed for moderate constipation. 12/29/21   Rai, Vernelle Emerald, MD  promethazine (PHENERGAN) 12.5 MG suppository Place 12.5 mg rectally every 12 (twelve) hours as needed for nausea or vomiting.    [provider]  rizatriptan (MAXALT) 10 MG tablet Take 10 mg by mouth daily as needed for migraine.    [provider]  tamsulosin (FLOMAX) 0.4 MG CAPS capsule  Take 1 capsule (0.4 mg total) by mouth daily after supper. 11/19/21   Roxan Hockey, MD  topiramate (TOPAMAX) 50 MG tablet Take 50 mg by mouth 2 (two) times daily.    [provider]  vitamin C (ASCORBIC ACID) 500 MG tablet Take 500 mg by mouth 2 (two) times daily.    [provider]  Vitamin D, Ergocalciferol, (DRISDOL) 1.25 MG (50000 UNIT) CAPS capsule Take 50,000 Units by mouth every Saturday.    [provider]     Family History  Adopted: Yes  Problem Relation Age of Onset   Heart disease Mother     Social History   Socioeconomic History   Marital status: Single    Spouse name: Not on file   Number of children: Not on file   Years of education: Not on file   Highest education level: Not on file  Occupational History   Not on file  Tobacco Use   Smoking status: Former  Smokeless tobacco: Never  Substance and Sexual Activity   Alcohol use: Not Currently    Alcohol/week: 0.0 standard drinks of alcohol    Comment: "weekend drinker, Saturday night drunk" years ago   Drug use: No   Sexual activity: Never  Other Topics Concern   Not on file  Social History Narrative   Not on file   Social Determinants of Health   Financial Resource Strain: Not on file  Food Insecurity: Unknown (12/29/2021)   Hunger Vital Sign    Worried About Running Out of Food in the Last Year: Patient refused    [place] in the Last Year: Patient refused  Transportation Needs: No Transportation Needs (12/29/2021)   PRAPARE - Hydrologist (Medical): No    Lack of Transportation (Non-Medical): No  Physical Activity: Not on file  Stress: Not on file  Social Connections: Not on file    Review of Systems: A 12 point ROS discussed and pertinent positives are indicated in the HPI above.  All other systems are negative.  Review of Systems  Constitutional:  Negative for fatigue and fever.  Respiratory:  Negative for cough and shortness of  breath.   Cardiovascular:  Negative for chest pain.  Gastrointestinal:  Negative for abdominal pain and nausea.  Musculoskeletal:  Negative for back pain.  Psychiatric/Behavioral:  Negative for behavioral problems and confusion.     Vital Signs: BP (!) 152/74 (BP Location: Right Arm)   Pulse 78   Temp 98.3 F (36.8 C) (Oral)   Resp 18   Ht '5\' 7"'$  (1.702 m)   Wt 230 lb (104.3 kg)   SpO2 98%   BMI 36.02 kg/m    Physical Exam Vitals and nursing note reviewed.  Constitutional:      General: She is not in acute distress.    Appearance: Normal appearance. She is not ill-appearing.  HENT:     Mouth/Throat:     Pharynx: Oropharynx is clear.  Cardiovascular:     Rate and Rhythm: Normal rate and regular rhythm.  Pulmonary:     Effort: Pulmonary effort is normal.     Breath sounds: Normal breath sounds.  Abdominal:     General: Abdomen is flat.     Palpations: Abdomen is soft.  Skin:    General: Skin is warm and dry.  Neurological:     General: No focal deficit present.     Mental Status: She is oriented to person, place, and time. Mental status is at baseline.  Psychiatric:        Mood and Affect: Mood normal.        Behavior: Behavior normal.        Thought Content: Thought content normal.        Judgment: Judgment normal.     Imaging: IR NEPHROSTOMY EXCHANGE RIGHT  Result Date: 03/10/2022 INDICATION: 76 year old woman with history of paraplegia underwent right percutaneous nephrostomy tube placement on 12/18/2021 for sepsis caused by obstructing right renal calculus. Her tube was accidentally dislodged on 03/09/2022. She returns to IR for re-insertion. EXAM: Fluoroscopy guided re-insertion of dislodged right nephrostomy drain. COMPARISON:  02/16/2022 MEDICATIONS: Levofloxacin 500 mg IV; The antibiotic was administered in an appropriate time frame prior to skin puncture. ANESTHESIA/SEDATION: None CONTRAST:  10 mL Omnipaque 300-administered into the collecting system(s)  FLUOROSCOPY TIME:  Radiation Exposure Index (as provided by the fluoroscopic device): 9 mGy Kerma COMPLICATIONS: None immediate. PROCEDURE: Informed written consent was obtained from the patient  after a thorough discussion of the procedural risks, benefits and alternatives. All questions were addressed. Maximal Sterile Barrier Technique was utilized including caps, mask, sterile gowns, sterile gloves, sterile drape, hand hygiene and skin antiseptic. A timeout was performed prior to the initiation of the procedure. The existing right nephrostomy tract was accessed with Kumpe catheter and fluoroscopic guidance. Kumpe catheter was exchanged for 10.2 [place] multipurpose pigtail drain over 0.035 inch glidewire. Drain was secured to skin with suture and connected to bag. 100 mL of purulent urine was aspirated from the right renal collecting system. Samples were sent for Gram stain and culture. IMPRESSION: Successful reinsertion of right nephrostomy tract drain with fluoroscopic guidance. 100 mL of purulent material was aspirated. Samples were sent for Gram stain and culture. PLAN: Patient is transitioning urologic care from Dr. Felipa Eth to Dr. Alyson Ingles. She iha yet to see Dr. Alyson Ingles. After she sees Dr. Alyson Ingles and returns for her nephrostomy drain exchange, we should consider her for conversion to nephroureteral catheter given that this is her second episode of retraction/dislodgement. Return in 8 weeks for next routine exchange. Electronically Signed   By: Miachel Roux M.D.   On: 03/10/2022 14:25   CT Renal Stone Study  Result Date: 02/23/2022 CLINICAL DATA:  Nephrostomy tube catheter pulled out about 3 inches in a patient with large renal calculus. EXAM: CT ABDOMEN AND PELVIS WITHOUT CONTRAST TECHNIQUE: Multidetector CT imaging of the abdomen and pelvis was performed following the standard protocol without IV contrast. RADIATION DOSE REDUCTION: This exam was performed according to the departmental  dose-optimization program which includes automated exposure control, adjustment of the mA and/or kV according to patient size and/or use of iterative reconstruction technique. COMPARISON:  January 31, 2022 FINDINGS: Lower chest: Slightly improved aeration at the RIGHT lung base still with patchy ground-glass nodularity, volume loss and consolidative changes. These are mild and improved since previous imaging. No pleural effusion. Mild LEFT basilar atelectasis. Hepatobiliary: Nodular hepatic contour with signs of fissural widening. Sludge in the gallbladder. No pericholecystic stranding. No gross biliary duct dilation. 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 No sign of pancreatic inflammation. Spleen: Mild splenomegaly unchanged. Adrenals/Urinary Tract: Adrenal glands are normal. Marked perinephric stranding. Nephrostomy tube in place with marker for sideholes within the parenchyma of the interpolar RIGHT kidney, not extending beyond the renal parenchyma. Marker for sideholes with similar appearance in terms of positioning compared to previous imaging. Pigtail portion is coiled within lower pole collecting system elements similar to previous imaging. Large branch type calculus with lamellated appearance extends into the UPJ from the renal sinus measuring 3.7 x 2.7 cm similar to prior imaging. Renal cysts and marked RIGHT renal cortical atrophy are similar to prior imaging as well. LEFT renal lesions are unchanged including an intermediate density lesion arising from the lower pole measuring approximately 1.7 cm, this area shown to represent a Bosniak category II cyst on prior imaging. LEFT renal cysts are stable. No dedicated follow-up imaging is suggested for renal cysts. Urinary bladder is collapsed with Foley catheter in place. There is no hydronephrosis. Distal RIGHT ureteral calculi with similar appearance, largest measuring 11 mm just proximal to the RIGHT UVJ. Two additional calculi present in the RIGHT  ureter. The largest 11 mm calculus may have progressed slightly towards the UVJ since previous imaging. Stomach/Bowel: Signs of fecal impaction with large burden of stool in the rectum which shows wall thickening despite rectal distension. Perirectal stranding is similar to previous imaging. Perianal stranding also present. Rectum distended up  to 9.5 cm. Slightly increased stool in the distal sigmoid. No signs of overt obstruction of upstream loops of bowel currently. Stomach under distended. No signs of small bowel dilation. Vascular/Lymphatic: Aortic atherosclerosis. No sign of aneurysm. Smooth contour of the IVC. There is no gastrohepatic or hepatoduodenal ligament lymphadenopathy. No retroperitoneal or mesenteric lymphadenopathy. No pelvic sidewall lymphadenopathy. Atherosclerotic changes are moderate to marked. Reproductive: Post hysterectomy. Other: No free air or ascites.  No pneumatosis. Musculoskeletal: Destructive changes about L2-3 with chronic appearance. Levo convexity of the spine and degenerative changes. Marked muscular atrophy. Stage IV decubitus ulceration overlying the RIGHT ischium with sclerotic appearing ischial tuberosity suggesting chronic osteomyelitis. Small amount of gas tracking posterior to the acetabulum and along the RIGHT sacro tuberous ligament. Fluid containing tract extending along the structures previously. Gas in continuity with the skin ulceration and perhaps with some packing material in the wound. Adjacent perianal thickening/stranding in the setting of suspected fecal impaction. No well-formed fluid collection. IMPRESSION: 1. Nephrostomy tube remaining in lower pole collecting systems of the RIGHT kidney, no signs of hydronephrosis with similar appearance of large staghorn calculus and overall similar appearance of distal ureteral calculi, perhaps mild distal migration of the largest calculus. No hydronephrosis. 2. Signs of stercoral proctitis and colitis due to fecal  impaction similar to previous imaging. 3. Decubitus ulceration with local extension of fluid in gas along the acetabulum and sacrotuberous ligament. Fluid in these areas previously but no well-formed collection. Findings favored to represent gas tracking from the open wound into these areas but would suggest close attention on follow-up and correlation with direct clinical inspection to ensure no worsening and development of more aggressive soft tissue infection. 4. Aortic atherosclerosis. 5. Nodular hepatic contours raising the question of background liver disease. 6. Contralateral LEFT nephrolithiasis. Aortic Atherosclerosis (ICD10-I70.0). Electronically Signed   By: Zetta Bills M.D.   On: 02/23/2022 14:46    Labs:  CBC: Recent Labs    12/29/21 0424 02/16/22 1350 02/23/22 1358 03/09/22 1109  WBC 9.2 9.9 9.6 8.5  HGB 8.1* 10.5* 10.3* 10.4*  HCT 26.5* 32.9* 33.6* 34.4*  PLT 266 248 233 275    COAGS: Recent Labs    11/14/21 2053 11/15/21 0352 12/24/21 0244 02/16/22 1350 03/09/22 1109  INR 1.4* 1.5* 1.4* 1.1 1.1  APTT 48*  --   --   --   --     BMP: Recent Labs    12/29/21 0424 02/16/22 1350 02/23/22 1358 03/09/22 1109  NA 134* 141 139 137  K 4.5 4.0 3.2* 4.7  CL 106 111 110 106  CO2 20* '24 22 24  '$ GLUCOSE 82 81 100* 100*  BUN '13 12 14 19  '$ CALCIUM 8.3* 8.1* 8.7* 8.9  CREATININE 0.63 0.39* 0.33* 0.46  GFRNONAA >60 >60 >60 >60    LIVER FUNCTION TESTS: Recent Labs    11/15/21 0352 11/18/21 0310 12/22/21 1342 03/09/22 1109  BILITOT 0.7 0.6 1.0 0.5  AST 50* 46* 29 28  ALT 32 40 21 18  ALKPHOS 130* 101 146* 81  PROT 5.9* 5.2* 6.8 6.5  ALBUMIN 2.3* 2.0* 2.2* 3.1*      Assessment and Plan:  76 y.o. female outpatient. Known to IR. History of GERD, HTN, DM, COPD, paraplegia, neurogenic bladder  s/p foley catheter and large right nephrolithiasis s/p right nephrostomy placement 9.29.23. IR placed a right sided nephrostomy tube on 9.23.23 due to sepsis caused but  an obstructing right sided renal calculi. The nephrostomy tube has dislodged or fallen out  several times. Facility contacted IR to report the right nephrostomy tube had fallen out again 12/21.  She was unable to make her scheduled appointment for replacement 12.22.  Patient presents for right nephrostomy tube possible nephroureteral placement.  Prior tract unaccessed/maintained in >1 week.   Labs obtained and reviewed. All medications are within acceptable parameters. Allergies include codeine, Invanz, propranolol, rocephin and vancomycin.   Given 500 mg levaquin for procedure today.   Risks and benefits of right sided PCN placement was discussed with the patient including, but not limited to, infection, bleeding, significant bleeding causing loss or decrease in renal function or damage to adjacent structures.   All of the patient's questions were answered, patient is agreeable to proceed.  Consent signed and in chart.    Thank you for this interesting consult.  I greatly enjoyed meeting [name] and look forward to participating in their care.  A copy of this report was sent to the requesting provider on this date.  Electronically Signed: Docia Barrier, PA 03/25/2022, 10:42 AM   I spent a total of  40 Minutes   in face to face in clinical consultation, greater than 50% of which was counseling/coordinating care for right pyelonephrosis.

## 2022-03-25 NOTE — Sedation Documentation (Signed)
Care Hand Off will go to [place], RN short stay. Patient did not receive sedation. Will be observed for 1 hr.

## 2022-03-25 NOTE — Sedation Documentation (Signed)
Patient is resting comfortably in the prone position. No complaints or distress at this time.

## 2022-03-25 NOTE — Procedures (Signed)
Interventional Radiology Procedure Note  Procedure: Right percutaneous Nephroureteral drain insertion  Indication: Right hydronephrosis  Findings: Please refer to procedural dictation for full description.  Complications: None  EBL: < 10 mL  Miachel Roux, MD [phone]

## 2022-04-08 ENCOUNTER — Ambulatory Visit: Payer: Medicare Other | Admitting: Urology

## 2022-04-08 DIAGNOSIS — N200 Calculus of kidney: Secondary | ICD-10-CM

## 2022-04-26 ENCOUNTER — Ambulatory Visit: Payer: Medicare Other | Admitting: Urology

## 2022-04-26 DIAGNOSIS — N201 Calculus of ureter: Secondary | ICD-10-CM

## 2022-05-27 ENCOUNTER — Ambulatory Visit (INDEPENDENT_AMBULATORY_CARE_PROVIDER_SITE_OTHER): Payer: Medicare Other | Admitting: Urology

## 2022-05-27 ENCOUNTER — Encounter: Payer: Self-pay | Admitting: Urology

## 2022-05-27 VITALS — BP 101/70 | HR 78

## 2022-05-27 DIAGNOSIS — N200 Calculus of kidney: Secondary | ICD-10-CM | POA: Diagnosis not present

## 2022-05-27 NOTE — Progress Notes (Signed)
05/27/2022 11:39 AM   ZAINEB PALAS 1945/08/21 [id number]  Referring provider: Nanine Means [address] [place],   [address]  Followup nephrolithiasis   HPI: Ms [name] is a 77yo here for followup for nephrolithiasis. She has 2 right ureteral calculus and a large right UPJ calculus. She is confined to a stretcher. He nephrostomy tube was last changed 03/25/2022. Her right UPJ calculus is 3.7cm.    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[place])    Pyelonephritis    Thyroid disease     Surgical History: Past Surgical History:  Procedure Laterality Date   ABDOMINAL HYSTERECTOMY     BACK SURGERY     IR NEPHROSTOMY EXCHANGE LEFT  02/16/2022   IR NEPHROSTOMY EXCHANGE RIGHT  03/10/2022   IR NEPHROSTOMY PLACEMENT RIGHT  12/24/2021   IR NEPHROURETERAL CATH PLACE RIGHT  03/25/2022    Home Medications:  Allergies as of 05/27/2022       Reactions   Codeine Nausea Only   Listed on MAR   Invanz [ertapenem] Other (See Comments)   Patient states allergy but is not certain    Propranolol Hcl    Listed on MAR   Rocephin [ceftriaxone] Hives   Vancomycin Hives        Medication List        Accurate as of May 27, 2022 11:39 AM. If you have any questions, ask your nurse or doctor.          acetaminophen 325 MG tablet Commonly known as: TYLENOL Take 1 tablet (325 mg total) by mouth every 6 (six) hours as needed for mild pain, moderate pain, fever or headache. *May take one every 6 hours as needed for pain What changed:  how much to take additional instructions   ACIDOPHILUS PO Take 1 tablet by mouth in the morning and at bedtime.   Arginaid Pack Take 1 packet by mouth daily.   ascorbic acid 500 MG tablet Commonly known as: VITAMIN C Take 500 mg by mouth 2  (two) times daily.   atorvastatin 10 MG tablet Commonly known as: LIPITOR Take 10 mg by mouth every evening.   azelastine 0.1 % nasal spray Commonly known as: ASTELIN Place 2 sprays into both nostrils 2 (two) times daily. Use in each nostril as directed   b complex vitamins tablet Take 1 tablet by mouth daily.   CALCIUM CARBONATE-VITAMIN D3 PO Take 1 tablet by mouth in the morning and at bedtime.   carvedilol 6.25 MG tablet Commonly known as: COREG Take 6.25 mg by mouth 2 (two) times daily. Hold for SBP [id number] or HR < 60   CERTAGEN PO Take 1 tablet by mouth daily.   Cranberry 500 MG Tabs Take 1 capsule by mouth daily.   dextromethorphan-guaiFENesin 30-600 MG 12hr tablet Commonly known as: MUCINEX DM Take 1 tablet by mouth every 12 (twelve) hours as needed for cough.   docusate sodium 100 MG capsule Commonly known as: COLACE Take 200 mg by mouth at bedtime.   ezetimibe 10 MG tablet Commonly known as: ZETIA Take 10 mg by mouth at bedtime.   famotidine 20 MG tablet Commonly known as: PEPCID Take 20 mg by mouth 2 (two) times daily.   feeding supplement (PRO-STAT 64) Liqd  Take 30 mLs by mouth 3 (three) times daily with meals.   ferrous sulfate 325 (65 FE) MG EC tablet Take 325 mg by mouth 3 (three) times daily with meals.   fluticasone 50 MCG/ACT nasal spray Commonly known as: FLONASE Place 2 sprays into both nostrils daily.   gabapentin 300 MG capsule Commonly known as: NEURONTIN Take 300 mg by mouth every 8 (eight) hours.   ipratropium-albuterol 0.5-2.5 (3) MG/3ML Soln Commonly known as: DUONEB Take 3 mLs by nebulization every 6 (six) hours as needed (shortness of breath).   levothyroxine 150 MCG tablet Commonly known as: SYNTHROID Take 150 mcg by mouth daily.   lisinopril 2.5 MG tablet Commonly known as: ZESTRIL Take 1.25 mg by mouth daily.   midodrine 5 MG tablet Commonly known as: PROAMATINE Take 1 tablet (5 mg total) by mouth 3 (three) times daily  with meals. What changed: when to take this   mirabegron ER 25 MG Tb24 tablet Commonly known as: MYRBETRIQ Take 25 mg by mouth daily.   OcuSoft Eyelid Cleansing Pads Apply 1 Application topically daily.   Pataday 0.1 % ophthalmic solution Generic drug: olopatadine Place 1 drop into both eyes every 6 (six) hours as needed for allergies.   polyethylene glycol 17 g packet Commonly known as: MIRALAX / GLYCOLAX Take 17 g by mouth daily as needed for moderate constipation.   promethazine 12.5 MG suppository Commonly known as: PHENERGAN Place 12.5 mg rectally every 12 (twelve) hours as needed for nausea or vomiting.   rizatriptan 10 MG tablet Commonly known as: MAXALT Take 10 mg by mouth daily as needed for migraine.   tamsulosin 0.4 MG Caps capsule Commonly known as: FLOMAX Take 1 capsule (0.4 mg total) by mouth daily after supper.   topiramate 50 MG tablet Commonly known as: TOPAMAX Take 50 mg by mouth 2 (two) times daily.   Vitamin D (Ergocalciferol) 1.25 MG (50000 UNIT) Caps capsule Commonly known as: DRISDOL Take 50,000 Units by mouth every Saturday.        Allergies:  Allergies  Allergen Reactions   Codeine Nausea Only    Listed on MAR   Invanz [Ertapenem] Other (See Comments)    Patient states allergy but is not certain    Propranolol Hcl     Listed on MAR   Rocephin [Ceftriaxone] Hives   Vancomycin Hives    Family History: Family History  Adopted: Yes  Problem Relation Age of Onset   Heart disease Mother     Social History:  reports that she has quit smoking. She has never used smokeless tobacco. She reports that she does not currently use alcohol. She reports that she does not use drugs.  ROS: All other review of systems were reviewed and are negative except what is noted above in HPI  Physical Exam: BP 101/70   Pulse 78   Constitutional:  Alert and oriented, No acute distress. HEENT: [place] AT, moist mucus membranes.  Trachea midline, no  masses. Cardiovascular: No clubbing, cyanosis, or edema. Respiratory: Normal respiratory effort, no increased work of breathing. GI: Abdomen is soft, nontender, nondistended, no abdominal masses GU: No CVA tenderness.  Lymph: No cervical or inguinal lymphadenopathy. Skin: No rashes, bruises or suspicious lesions. Neurologic: Grossly intact, no focal deficits, moving all 4 extremities. Psychiatric: Normal mood and affect.  Laboratory Data: Lab Results  Component Value Date   WBC 5.9 03/25/2022   HGB 10.9 (L) 03/25/2022   HCT 35.3 (L) 03/25/2022   MCV 90.7 03/25/2022  PLT 250 03/25/2022    Lab Results  Component Value Date   CREATININE 0.46 03/09/2022    No results found for: "PSA"  No results found for: "TESTOSTERONE"  Lab Results  Component Value Date   HGBA1C 6.0 (H) 11/15/2021    Urinalysis    Component Value Date/Time   COLORURINE YELLOW 12/24/2021 0244   APPEARANCEUR TURBID (A) 12/24/2021 0244   APPEARANCEUR Cloudy (A) 02/23/2021 1343   LABSPEC 1.016 12/24/2021 0244   PHURINE 5.0 12/24/2021 0244   GLUCOSEU NEGATIVE 12/24/2021 0244   HGBUR SMALL (A) 12/24/2021 0244   BILIRUBINUR NEGATIVE 12/24/2021 0244   BILIRUBINUR Negative 02/23/2021 1343   KETONESUR 5 (A) 12/24/2021 0244   PROTEINUR 100 (A) 12/24/2021 0244   UROBILINOGEN 0.2 09/08/2014 0436   NITRITE NEGATIVE 12/24/2021 0244   LEUKOCYTESUR LARGE (A) 12/24/2021 0244    Lab Results  Component Value Date   LABMICR See below: 02/23/2021   WBCUA 11-30 (A) 02/23/2021   LABEPIT 0-10 02/23/2021   MUCUS Present 02/23/2021   BACTERIA MANY (A) 12/24/2021    Pertinent Imaging: CT 02/23/2022: Images reviewed and discussed with the patient  No results found for this or any previous visit.  No results found for this or any previous visit.  No results found for this or any previous visit.  No results found for this or any previous visit.  No results found for this or any previous visit.  No valid  procedures specified. No results found for this or any previous visit.  Results for orders placed during the hospital encounter of 02/23/22  CT Renal Stone Study  Narrative CLINICAL DATA:  Nephrostomy tube catheter pulled out about 3 inches in a patient with large renal calculus.  EXAM: CT ABDOMEN AND PELVIS WITHOUT CONTRAST  TECHNIQUE: Multidetector CT imaging of the abdomen and pelvis was performed following the standard protocol without IV contrast.  RADIATION DOSE REDUCTION: This exam was performed according to the departmental dose-optimization program which includes automated exposure control, adjustment of the mA and/or kV according to patient size and/or use of iterative reconstruction technique.  COMPARISON:  January 31, 2022  FINDINGS: Lower chest: Slightly improved aeration at the RIGHT lung base still with patchy ground-glass nodularity, volume loss and consolidative changes. These are mild and improved since previous imaging. No pleural effusion. Mild LEFT basilar atelectasis.  Hepatobiliary: Nodular hepatic contour with signs of fissural widening. Sludge in the gallbladder. No pericholecystic stranding. No gross biliary duct dilation.  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 No sign of pancreatic inflammation.  Spleen: Mild splenomegaly unchanged.  Adrenals/Urinary Tract: Adrenal glands are normal.  Marked perinephric stranding. Nephrostomy tube in place with marker for sideholes within the parenchyma of the interpolar RIGHT kidney, not extending beyond the renal parenchyma. Marker for sideholes with similar appearance in terms of positioning compared to previous imaging. Pigtail portion is coiled within lower pole collecting system elements similar to previous imaging. Large branch type calculus with lamellated appearance extends into the UPJ from the renal sinus measuring 3.7 x 2.7 cm similar to prior imaging. Renal cysts and marked RIGHT renal  cortical atrophy are similar to prior imaging as well.  LEFT renal lesions are unchanged including an intermediate density lesion arising from the lower pole measuring approximately 1.7 cm, this area shown to represent a Bosniak category II cyst on prior imaging. LEFT renal cysts are stable. No dedicated follow-up imaging is suggested for renal cysts.  Urinary bladder is collapsed with Foley catheter in place. There  is no hydronephrosis. Distal RIGHT ureteral calculi with similar appearance, largest measuring 11 mm just proximal to the RIGHT UVJ. Two additional calculi present in the RIGHT ureter. The largest 11 mm calculus may have progressed slightly towards the UVJ since previous imaging.  Stomach/Bowel: Signs of fecal impaction with large burden of stool in the rectum which shows wall thickening despite rectal distension. Perirectal stranding is similar to previous imaging. Perianal stranding also present. Rectum distended up to 9.5 cm. Slightly increased stool in the distal sigmoid. No signs of overt obstruction of upstream loops of bowel currently. Stomach under distended. No signs of small bowel dilation.  Vascular/Lymphatic:  Aortic atherosclerosis. No sign of aneurysm. Smooth contour of the IVC. There is no gastrohepatic or hepatoduodenal ligament lymphadenopathy. No retroperitoneal or mesenteric lymphadenopathy.  No pelvic sidewall lymphadenopathy.  Atherosclerotic changes are moderate to marked.  Reproductive: Post hysterectomy.  Other: No free air or ascites.  No pneumatosis.  Musculoskeletal: Destructive changes about L2-3 with chronic appearance. Levo convexity of the spine and degenerative changes. Marked muscular atrophy.  Stage IV decubitus ulceration overlying the RIGHT ischium with sclerotic appearing ischial tuberosity suggesting chronic osteomyelitis. Small amount of gas tracking posterior to the acetabulum and along the RIGHT sacro tuberous ligament.  Fluid containing tract extending along the structures previously. Gas in continuity with the skin ulceration and perhaps with some packing material in the wound.  Adjacent perianal thickening/stranding in the setting of suspected fecal impaction. No well-formed fluid collection.  IMPRESSION: 1. Nephrostomy tube remaining in lower pole collecting systems of the RIGHT kidney, no signs of hydronephrosis with similar appearance of large staghorn calculus and overall similar appearance of distal ureteral calculi, perhaps mild distal migration of the largest calculus. No hydronephrosis. 2. Signs of stercoral proctitis and colitis due to fecal impaction similar to previous imaging. 3. Decubitus ulceration with local extension of fluid in gas along the acetabulum and sacrotuberous ligament. Fluid in these areas previously but no well-formed collection. Findings favored to represent gas tracking from the open wound into these areas but would suggest close attention on follow-up and correlation with direct clinical inspection to ensure no worsening and development of more aggressive soft tissue infection. 4. Aortic atherosclerosis. 5. Nodular hepatic contours raising the question of background liver disease. 6. Contralateral LEFT nephrolithiasis.  Aortic Atherosclerosis (ICD10-I70.0).   Electronically Signed By: Zetta Bills M.D. On: 02/23/2022 14:46   Assessment & Plan:    1. Nephrolithiasis -We discussed the management of kidney stones. These options include observation, ureteroscopy, shockwave lithotripsy (ESWL) and percutaneous nephrolithotomy (PCNL). We discussed which options are relevant to the patient's stone(s). We discussed the natural history of kidney stones as well as the complications of untreated stones and the impact on quality of life without treatment as well as with each of the above listed treatments. We also discussed the efficacy of each treatment in its ability  to clear the stone burden. With any of these management options I discussed the signs and symptoms of infection and the need for emergent treatment should these be experienced. For each option we discussed the ability of each procedure to clear the patient of their stone burden.   For observation I described the risks which include but are not limited to silent renal damage, life-threatening infection, need for emergent surgery, failure to pass stone and pain.   For ureteroscopy I described the risks which include bleeding, infection, damage to contiguous structures, positioning injury, ureteral stricture, ureteral avulsion, ureteral injury, need for prolonged  ureteral stent, inability to perform ureteroscopy, need for an interval procedure, inability to clear stone burden, stent discomfort/pain, heart attack, stroke, pulmonary embolus and the inherent risks with general anesthesia.   For shockwave lithotripsy I described the risks which include arrhythmia, kidney contusion, kidney hemorrhage, need for transfusion, pain, inability to adequately break up stone, inability to pass stone fragments, Steinstrasse, infection associated with obstructing stones, need for alternate surgical procedure, need for repeat shockwave lithotripsy, MI, CVA, PE and the inherent risks with anesthesia/conscious sedation.   For PCNL I described the risks including positioning injury, pneumothorax, hydrothorax, need for chest tube, inability to clear stone burden, renal laceration, arterial venous fistula or malformation, need for embolization of kidney, loss of kidney or renal function, need for repeat procedure, need for prolonged nephrostomy tube, ureteral avulsion, MI, CVA, PE and the inherent risks of general anesthesia.   - The patient would like to proceed with right ureteroscopic stone extraction   No follow-ups on file.  Nicolette Bang, MD  [facility] [place]

## 2022-05-27 NOTE — Patient Instructions (Signed)
Ureteroscopy  Ureteroscopy is a procedure to check for and treat problems inside part of the urinary tract. In this procedure, a long rigid or flexible tube with a lens and light at the end (ureteroscope) is used to look at the inside of the kidneys and the ureters. The ureters are the tubes that carry urine from the kidneys to the bladder. The ureteroscope is inserted into one or both of the ureters. You may need this procedure if you have frequent urinary tract infections (UTIs), blood in your urine, or a stone in one or both of your ureters. A ureteroscopy can be done: To find the cause of urine blockage in a ureter and to evaluate other abnormalities inside the ureters or kidneys. To remove stones. To remove or treat growths of tissue (polyps), abnormal tissue, and some types of tumors. To remove a tissue sample and check it for disease under a microscope (biopsy). Tell a health care provider about: Any allergies you have. All medicines you are taking, including vitamins, herbs, eye drops, creams, and over-the-counter medicines. Any problems you or family members have had with anesthetic medicines. Any bleeding problems you have. Any surgeries you have had. Any medical conditions you have. Whether you are pregnant or may be pregnant. What are the risks? Your health care provider will talk with you about risks. These may include: Abdominal pain or a burning feeling or pain while urinating. Abnormal bleeding. A UTI. Allergic reactions to medicines. Scarring that narrows the ureter (stricture) or swelling. Creating a hole (perforation) in the ureter. Damage to other structures or organs, such as the part of your body that drains urine from your bladder (urethra), your bladder, or your uterus. What happens before the procedure? When to stop eating and drinking  8 hours before your procedure Stop eating most foods. Do not eat meat, fried foods, or fatty foods. Eat only light foods, such  as toast or crackers. All liquids are okay except energy drinks and alcohol. 6 hours before your procedure Stop eating. Drink only clear liquids, such as water, clear fruit juice, black coffee, plain tea, and sports drinks. Do not drink energy drinks or alcohol. 2 hours before your procedure Stop drinking all liquids. You may be allowed to take medicines with small sips of water. Medicines Ask your health care provider about: Changing or stopping your regular medicines. These include any diabetes medicines or blood thinners you take. Taking medicines such as aspirin and ibuprofen. These medicines can thin your blood. Do not take these medicines unless your health care provider tells you to. Taking over-the-counter medicines, vitamins, herbs, and supplements. General instructions Do not use any products that contain nicotine or tobacco for at least 4 weeks before the procedure. These products include cigarettes, chewing tobacco, and vaping devices, such as e-cigarettes. If you need help quitting, ask your health care provider. If you will be going home right after the procedure, plan to have a responsible adult: Take you home from the hospital or clinic. You will not be allowed to drive. Care for you for the time you are told. Ask your health care provider what steps will be taken to help prevent infection. These may include: Washing skin with a soap that kills germs. Receiving antibiotic medicine. Tests You may have an exam or testing. You may have a urine sample taken to check for infection. What happens during the procedure? An IV will be inserted into one of your veins. You may be given: A sedative. This helps  you relax. Anesthesia. This will: Numb certain areas of your body. Make you fall asleep for surgery. Your urethra will be cleaned with a germ-killing solution. The ureteroscope will be passed through your urethra into your bladder. A salt-water solution will be sent through  the ureteroscope to fill your bladder. This will help the health care provider see the openings of your ureters more clearly. The ureteroscope will be passed into your ureter. If a growth is found, a biopsy may be done. If a stone is found, it may be removed through the ureteroscope, or the stone may be broken up using a laser, shock waves, or electrical energy. In some cases, if the ureter is too small, a tube may be inserted that keeps the ureter open (ureteral stent). The stent may be left in place for 1 or 2 weeks, and then the ureteroscopy procedure will be done again. The scope will be removed, and your bladder will be emptied. The procedure may vary among health care providers and hospitals. What happens after the procedure? Your blood pressure, heart rate, breathing rate, and blood oxygen level will be monitored until you leave the hospital or clinic. It is up to you to get the results of your procedure. Ask your health care provider, or the department that is doing the procedure, when your results will be ready. Summary Ureteroscopy is a procedure used to look at the inside of the kidneys and the ureters. You may need this procedure if you have frequent urinary tract infections (UTIs), blood in your urine, or a stone in one or both of your ureters. Follow instructions from your health care provider about eating and drinking. In some cases, if the ureter is too small, a tube may be inserted that keeps the ureter open (ureteral stent). The stent may be left in place for 1 or 2 weeks to keep the ureter open, and then the ureteroscopy procedure will be done again. This information is not intended to replace advice given to you by your health care provider. Make sure you discuss any questions you have with your health care provider. Document Revised: 07/09/2021 Document Reviewed: 07/09/2021 Elsevier Patient Education  [employer].

## 2022-06-01 ENCOUNTER — Telehealth: Payer: Self-pay

## 2022-06-01 NOTE — Telephone Encounter (Signed)
Left a voice message. [name] at facility home patient lives at called. Has patient's surgery been scheduled?  Call back:  [phone]

## 2022-06-02 NOTE — Telephone Encounter (Signed)
Left detailed message for H[place]am of surgery date 07/21/2022 and pre op would reach out to her number to provided pre op appt and to return call to office if any questions.

## 2022-06-09 ENCOUNTER — Other Ambulatory Visit (HOSPITAL_COMMUNITY): Payer: Medicare Other

## 2022-06-16 ENCOUNTER — Observation Stay (HOSPITAL_COMMUNITY)
Admission: EM | Admit: 2022-06-16 | Discharge: 2022-06-18 | Disposition: A | Discharge status: Skilled Nursing Facility | Payer: Medicare Other | Attending: Internal Medicine | Admitting: Internal Medicine

## 2022-06-16 ENCOUNTER — Encounter (HOSPITAL_COMMUNITY): Payer: Self-pay

## 2022-06-16 ENCOUNTER — Other Ambulatory Visit: Payer: Self-pay | Admitting: Urology

## 2022-06-16 ENCOUNTER — Ambulatory Visit (HOSPITAL_COMMUNITY)
Admission: RE | Admit: 2022-06-16 | Discharge: 2022-06-16 | Disposition: A | Discharge status: Home / Self Care | Payer: Medicare Other | Source: Ambulatory Visit | Attending: Urology | Admitting: Urology

## 2022-06-16 ENCOUNTER — Other Ambulatory Visit: Payer: Self-pay

## 2022-06-16 DIAGNOSIS — J45909 Unspecified asthma, uncomplicated: Secondary | ICD-10-CM | POA: Diagnosis not present

## 2022-06-16 DIAGNOSIS — Z436 Encounter for attention to other artificial openings of urinary tract: Secondary | ICD-10-CM | POA: Insufficient documentation

## 2022-06-16 DIAGNOSIS — N99528 Other complication of other external stoma of urinary tract: Secondary | ICD-10-CM

## 2022-06-16 DIAGNOSIS — I10 Essential (primary) hypertension: Secondary | ICD-10-CM | POA: Diagnosis present

## 2022-06-16 DIAGNOSIS — N200 Calculus of kidney: Secondary | ICD-10-CM | POA: Insufficient documentation

## 2022-06-16 DIAGNOSIS — L89153 Pressure ulcer of sacral region, stage 3: Secondary | ICD-10-CM | POA: Diagnosis not present

## 2022-06-16 DIAGNOSIS — Z79899 Other long term (current) drug therapy: Secondary | ICD-10-CM | POA: Insufficient documentation

## 2022-06-16 DIAGNOSIS — T83092A Other mechanical complication of nephrostomy catheter, initial encounter: Secondary | ICD-10-CM | POA: Diagnosis not present

## 2022-06-16 DIAGNOSIS — J449 Chronic obstructive pulmonary disease, unspecified: Secondary | ICD-10-CM | POA: Diagnosis not present

## 2022-06-16 DIAGNOSIS — N201 Calculus of ureter: Secondary | ICD-10-CM | POA: Insufficient documentation

## 2022-06-16 DIAGNOSIS — E039 Hypothyroidism, unspecified: Secondary | ICD-10-CM | POA: Diagnosis present

## 2022-06-16 DIAGNOSIS — G8220 Paraplegia, unspecified: Secondary | ICD-10-CM | POA: Diagnosis not present

## 2022-06-16 DIAGNOSIS — D638 Anemia in other chronic diseases classified elsewhere: Secondary | ICD-10-CM | POA: Insufficient documentation

## 2022-06-16 DIAGNOSIS — E119 Type 2 diabetes mellitus without complications: Secondary | ICD-10-CM

## 2022-06-16 DIAGNOSIS — L89150 Pressure ulcer of sacral region, unstageable: Secondary | ICD-10-CM | POA: Diagnosis present

## 2022-06-16 DIAGNOSIS — Z978 Presence of other specified devices: Secondary | ICD-10-CM

## 2022-06-16 DIAGNOSIS — Z87891 Personal history of nicotine dependence: Secondary | ICD-10-CM | POA: Insufficient documentation

## 2022-06-16 DIAGNOSIS — N319 Neuromuscular dysfunction of bladder, unspecified: Secondary | ICD-10-CM | POA: Diagnosis present

## 2022-06-16 HISTORY — PX: IR NEPHROSTOMY EXCHANGE RIGHT: IMG6070

## 2022-06-16 LAB — CBC
HCT: 34.5 % — ABNORMAL LOW (ref 36.0–46.0)
Hemoglobin: 10.4 g/dL — ABNORMAL LOW (ref 12.0–15.0)
MCH: 27.3 pg (ref 26.0–34.0)
MCHC: 30.1 g/dL (ref 30.0–36.0)
MCV: 90.6 fL (ref 80.0–100.0)
Platelets: 219 10*3/uL (ref 150–400)
RBC: 3.81 MIL/uL — ABNORMAL LOW (ref 3.87–5.11)
RDW: 15.3 % (ref 11.5–15.5)
WBC: 10.1 10*3/uL (ref 4.0–10.5)
nRBC: 0 % (ref 0.0–0.2)

## 2022-06-16 LAB — BASIC METABOLIC PANEL
Anion gap: 9 (ref 5–15)
BUN: 27 mg/dL — ABNORMAL HIGH (ref 8–23)
CO2: 24 mmol/L (ref 22–32)
Calcium: 8.9 mg/dL (ref 8.9–10.3)
Chloride: 102 mmol/L (ref 98–111)
Creatinine, Ser: 0.47 mg/dL (ref 0.44–1.00)
GFR, Estimated: >60 mL/min (ref >60)
Glucose, Bld: 125 mg/dL — ABNORMAL HIGH (ref 70–99)
Potassium: 3.6 mmol/L (ref 3.5–5.1)
Sodium: 135 mmol/L (ref 135–145)

## 2022-06-16 MED ORDER — LIDOCAINE HCL 1 % IJ SOLN
INTRAMUSCULAR | Status: AC
  Filled 2022-06-16: qty 20

## 2022-06-16 MED ORDER — ACETAMINOPHEN 500 MG PO TABS
1000.0000 mg | ORAL_TABLET | Freq: Once | ORAL | Status: AC
  Administered 2022-06-16: 1000 mg via ORAL
  Filled 2022-06-16: qty 2

## 2022-06-16 MED ORDER — PIPERACILLIN-TAZOBACTAM 3.375 G IVPB 30 MIN
3.3750 g | INTRAVENOUS | Status: AC
  Administered 2022-06-17: 3.375 g via INTRAVENOUS
  Filled 2022-06-16 (×2): qty 50

## 2022-06-16 MED ORDER — IOHEXOL 300 MG/ML  SOLN
50.0000 mL | Freq: Once | INTRAMUSCULAR | Status: AC | PRN
  Administered 2022-06-16: 20 mL

## 2022-06-16 NOTE — ED Triage Notes (Signed)
Pt transferred from IR following after complications with procedure. Plan to board pt and retry procedure tomorrow.

## 2022-06-16 NOTE — ED Provider Notes (Signed)
[place] [facility] Provider Note   CSN: [id number] Arrival date & time: 06/16/22  1650     History  Chief Complaint  Patient presents with   Post-op Problem    [name] is a 77 y.o. female.  Patient with history of COPD, paraplegia, T2DM, hypertension presents today with complaints of post op problem. Patient had an appointment for routine right nephroureteral catheter exchange with interventional radiology earlier today. During the procedure IR was unable to exchange the catheter as the distal aspect of catheter appeared to be occluded and could not remove over the wire. They then attempted to replace the catheter through the existing access without success. Given this, the patients urologist Dr. Alyson Ingles was consulted and plan is for new nephrostomy tube placement tomorrow as inpatient as patient will need IV and antibiotics for this procedure. She presents for same. She denies any fevers, chills, nausea, vomiting, diarrhea, abdominal pain.   The history is provided by the patient. No language interpreter was used.       Home Medications Prior to Admission medications   Medication Sig Start Date End Date Taking? Authorizing Provider  acetaminophen (TYLENOL) 325 MG tablet Take 1 tablet (325 mg total) by mouth every 6 (six) hours as needed for mild pain, moderate pain, fever or headache. *May take one every 6 hours as needed for pain Patient taking differently: Take 650 mg by mouth every 6 (six) hours as needed for mild pain, moderate pain, fever or headache. 12/29/21   Rai, Ripudeep Raliegh Ip, MD  Amino Acids-Protein Hydrolys (FEEDING SUPPLEMENT, PRO-STAT 64,) LIQD Take 30 mLs by mouth 3 (three) times daily with meals.    [provider]  atorvastatin (LIPITOR) 10 MG tablet Take 10 mg by mouth every evening.    [provider]  azelastine (ASTELIN) 0.1 % nasal spray Place 2 sprays into both nostrils 2 (two) times daily. Use in  each nostril as directed    [provider]  b complex vitamins tablet Take 1 tablet by mouth daily.    [provider]  Calcium Carb-Cholecalciferol (CALCIUM CARBONATE-VITAMIN D3 PO) Take 1 tablet by mouth in the morning and at bedtime.    [provider]  carvedilol (COREG) 6.25 MG tablet Take 6.25 mg by mouth 2 (two) times daily. Hold for SBP <100 or HR < 60    [provider]  Cranberry 500 MG TABS Take 1 capsule by mouth daily.    [provider]  dextromethorphan-guaiFENesin (MUCINEX DM) 30-600 MG per 12 hr tablet Take 1 tablet by mouth every 12 (twelve) hours as needed for cough.    [provider]  docusate sodium (COLACE) 100 MG capsule Take 200 mg by mouth at bedtime.    [provider]  Eyelid Cleansers (OCUSOFT EYELID CLEANSING) PADS Apply 1 Application topically daily.    [provider]  ezetimibe (ZETIA) 10 MG tablet Take 10 mg by mouth at bedtime.     [provider]  famotidine (PEPCID) 20 MG tablet Take 20 mg by mouth 2 (two) times daily.    [provider]  ferrous sulfate 325 (65 FE) MG EC tablet Take 325 mg by mouth 3 (three) times daily with meals.    [provider]  fluticasone (FLONASE) 50 MCG/ACT nasal spray Place 2 sprays into both nostrils daily.  09/03/14   [provider]  gabapentin (NEURONTIN) 300 MG capsule Take 300 mg by mouth every 8 (eight) hours.  [provider]  ipratropium-albuterol (DUONEB) 0.5-2.5 (3) MG/3ML SOLN Take 3 mLs by nebulization every 6 (six) hours as needed (shortness of breath). 11/19/21   Roxan Hockey, MD  Lactobacillus (ACIDOPHILUS PO) Take 1 tablet by mouth in the morning and at bedtime.    [provider]  levothyroxine (SYNTHROID) 150 MCG tablet Take 150 mcg by mouth daily.    [provider]  lisinopril (ZESTRIL) 2.5 MG tablet Take 1.25 mg by mouth daily.    [provider]  midodrine  (PROAMATINE) 5 MG tablet Take 1 tablet (5 mg total) by mouth 3 (three) times daily with meals. Patient taking differently: Take 5 mg by mouth 2 (two) times daily. 12/29/21   Rai, Ripudeep Raliegh Ip, MD  mirabegron ER (MYRBETRIQ) 25 MG TB24 tablet Take 25 mg by mouth daily.    [provider]  Multiple Vitamins-Minerals (CERTAGEN PO) Take 1 tablet by mouth daily.    [provider]  Nutritional Supplements (ARGINAID) PACK Take 1 packet by mouth daily.    [provider]  olopatadine (PATADAY) 0.1 % ophthalmic solution Place 1 drop into both eyes every 6 (six) hours as needed for allergies.    [provider]  polyethylene glycol (MIRALAX / GLYCOLAX) 17 g packet Take 17 g by mouth daily as needed for moderate constipation. 12/29/21   Rai, Vernelle Emerald, MD  promethazine (PHENERGAN) 12.5 MG suppository Place 12.5 mg rectally every 12 (twelve) hours as needed for nausea or vomiting.    [provider]  rizatriptan (MAXALT) 10 MG tablet Take 10 mg by mouth daily as needed for migraine.    [provider]  tamsulosin (FLOMAX) 0.4 MG CAPS capsule Take 1 capsule (0.4 mg total) by mouth daily after supper. 11/19/21   Roxan Hockey, MD  topiramate (TOPAMAX) 50 MG tablet Take 50 mg by mouth 2 (two) times daily.    [provider]  vitamin C (ASCORBIC ACID) 500 MG tablet Take 500 mg by mouth 2 (two) times daily.    [provider]  Vitamin D, Ergocalciferol, (DRISDOL) 1.25 MG (50000 UNIT) CAPS capsule Take 50,000 Units by mouth every Saturday.    [provider]      Allergies    Codeine, Invanz [ertapenem], Propranolol hcl, Rocephin [ceftriaxone], and Vancomycin    Review of Systems   Review of Systems  All other systems reviewed and are negative.   Physical Exam Updated Vital Signs BP 105/74   Pulse 80   Temp 97.6 F (36.4 C) (Oral)   Resp 16   SpO2 100%  Physical Exam Vitals and nursing note reviewed.  Constitutional:       General: She is not in acute distress.    Appearance: Normal appearance. She is normal weight. She is not ill-appearing, toxic-appearing or diaphoretic.  HENT:     Head: Normocephalic and atraumatic.  Cardiovascular:     Rate and Rhythm: Normal rate.  Pulmonary:     Effort: Pulmonary effort is normal. No respiratory distress.  Abdominal:     General: Abdomen is flat.     Palpations: Abdomen is soft.     Tenderness: There is no abdominal tenderness.  Musculoskeletal:        General: Normal range of motion.     Cervical back: Normal range of motion.  Skin:    General: Skin is warm and dry.  Neurological:     General: No focal deficit present.     Mental Status: She is alert.  Psychiatric:        Mood and Affect: Mood normal.        Behavior: Behavior normal.     ED Results / Procedures / Treatments   Labs (all labs ordered are listed, but only abnormal results are displayed) Labs Reviewed  CBC - Abnormal; Notable for the following components:      Result Value   RBC 3.81 (*)    Hemoglobin 10.4 (*)    HCT 34.5 (*)    All other components within normal limits  BASIC METABOLIC PANEL - Abnormal; Notable for the following components:   Glucose, Bld 125 (*)    BUN 27 (*)    All other components within normal limits  PROTIME-INR    EKG None  Radiology No results found.  Procedures Procedures    Medications Ordered in ED Medications  piperacillin-tazobactam (ZOSYN) IVPB 3.375 g (has no administration in time range)    ED Course/ Medical Decision Making/ A&P                             Medical Decision Making Amount and/or Complexity of Data Reviewed Labs: ordered.  Risk Prescription drug management.   Patient presents today after failed right nephroureteral catheter exchange with interventional radiology today.  Per chart review of interventional radiology notes, they did speak to the patient's urologist Dr. Alyson Ingles who plans to do a nephrostomy tube  replacement tomorrow as an inpatient.  Patient denies any complaints.  Laboratory evaluation reassuring.  Discussed with urology resident on-call Dr. Kathrynn Ducking who recommends IV Zosyn for pre-op antibiotics and NPO at midnight. She will reach out to Dr. Alyson Ingles in preparation for patients procedure tomorrow. Patient will require admission per urology recommendations.  Patient is understanding and amenable with plan.  Discussed patient with hospitalist on-call who accepts patient for admission.   This is a shared visit with supervising physician Dr. Darl Householder who has independently evaluated patient & provided guidance in evaluation/management/disposition, in agreement with care    Final Clinical Impression(s) / ED Diagnoses Final diagnoses:  Complication of nephrostomy [facility])    Rx / DC Orders ED Discharge Orders     None         Nestor Lewandowsky 06/16/22 2219    Drenda Freeze, MD 06/16/22 [phone]

## 2022-06-16 NOTE — Progress Notes (Addendum)
ED staff able to successfully place PIV.

## 2022-06-16 NOTE — H&P (Addendum)
PCP:   Nanine Means   Chief Complaint:  Right nephrostomy tube changed out  HPI: This is a 77 year old female, chronically paraplegic, bedbound, COPD, DM type 2, Bipolar d/o, neurogenic bladder, hypothyroidism, and nephrolithiasis with moderate to severe hydronephrosis.  Patient has right nephroureteral catheter. She has 2 right ureteral calculus and a large right UPJ calculus.  Her nephrostomy tubes are changed intermittently, last changed 03/25/2022.  She was here today for a nephrostomy tube change out by IR.  IR had a failed exchange of right nephroureteral catheter.  The old catheter was removed.  Patient sent to the ER by IR, will with plans for admission and inpatient change out of the right nephrostomy tube.  Review of Systems:  The patient denies anorexia, fever, weight loss,, vision loss, decreased hearing, hoarseness, chest pain, syncope, dyspnea on exertion, peripheral edema, balance deficits, hemoptysis, abdominal pain, melena, hematochezia, severe indigestion/heartburn, hematuria, incontinence, genital sores, muscle weakness, suspicious skin lesions, transient blindness, difficulty walking, depression, unusual weight change, abnormal bleeding, enlarged lymph nodes, angioedema, and breast masses.  Past Medical History: Past Medical History:  Diagnosis Date   Bipolar 1 disorder ([place])    COPD with asthma    Diabetes mellitus without complication ([place])    GERD (gastroesophageal reflux disease)    Hypertension    Obesity    Paraplegic spinal paralysis ([place])    Pyelonephritis    Thyroid disease    Past Surgical History:  Procedure Laterality Date   ABDOMINAL HYSTERECTOMY     BACK SURGERY     IR NEPHROSTOMY EXCHANGE LEFT  02/16/2022   IR NEPHROSTOMY EXCHANGE RIGHT  03/10/2022   IR NEPHROSTOMY EXCHANGE RIGHT  06/16/2022   IR NEPHROSTOMY PLACEMENT RIGHT  12/24/2021   IR NEPHROURETERAL CATH PLACE RIGHT  03/25/2022    Medications: Prior to Admission medications   Medication  Sig Start Date End Date Taking? Authorizing Provider  acetaminophen (TYLENOL) 325 MG tablet Take 1 tablet (325 mg total) by mouth every 6 (six) hours as needed for mild pain, moderate pain, fever or headache. *May take one every 6 hours as needed for pain Patient taking differently: Take 650 mg by mouth every 6 (six) hours as needed for mild pain, moderate pain, fever or headache. 12/29/21   Rai, Ripudeep Raliegh Ip, MD  Amino Acids-Protein Hydrolys (FEEDING SUPPLEMENT, PRO-STAT 64,) LIQD Take 30 mLs by mouth 3 (three) times daily with meals.    [provider]  atorvastatin (LIPITOR) 10 MG tablet Take 10 mg by mouth every evening.    [provider]  azelastine (ASTELIN) 0.1 % nasal spray Place 2 sprays into both nostrils 2 (two) times daily. Use in each nostril as directed    [provider]  b complex vitamins tablet Take 1 tablet by mouth daily.    [provider]  Calcium Carb-Cholecalciferol (CALCIUM CARBONATE-VITAMIN D3 PO) Take 1 tablet by mouth in the morning and at bedtime.    [provider]  carvedilol (COREG) 6.25 MG tablet Take 6.25 mg by mouth 2 (two) times daily. Hold for SBP <100 or HR < 60    [provider]  Cranberry 500 MG TABS Take 1 capsule by mouth daily.    [provider]  dextromethorphan-guaiFENesin (MUCINEX DM) 30-600 MG per 12 hr tablet Take 1 tablet by mouth every 12 (twelve) hours as needed for cough.    [provider]  docusate sodium (COLACE) 100 MG capsule Take 200 mg by mouth at bedtime.    [provider]  Eyelid Cleansers (OCUSOFT EYELID CLEANSING) PADS Apply 1 Application topically daily.    [provider]  ezetimibe (ZETIA) 10 MG tablet Take 10 mg by mouth at bedtime.     [provider]  famotidine (PEPCID) 20 MG tablet Take 20 mg by mouth 2 (two) times daily.    [provider]  ferrous sulfate 325 (65 FE) MG EC tablet Take 325 mg by mouth 3 (three) times daily  with meals.    [provider]  fluticasone (FLONASE) 50 MCG/ACT nasal spray Place 2 sprays into both nostrils daily.  09/03/14   [provider]  gabapentin (NEURONTIN) 300 MG capsule Take 300 mg by mouth every 8 (eight) hours.    [provider]  ipratropium-albuterol (DUONEB) 0.5-2.5 (3) MG/3ML SOLN Take 3 mLs by nebulization every 6 (six) hours as needed (shortness of breath). 11/19/21   Roxan Hockey, MD  Lactobacillus (ACIDOPHILUS PO) Take 1 tablet by mouth in the morning and at bedtime.    [provider]  levothyroxine (SYNTHROID) 150 MCG tablet Take 150 mcg by mouth daily.    [provider]  lisinopril (ZESTRIL) 2.5 MG tablet Take 1.25 mg by mouth daily.    [provider]  midodrine (PROAMATINE) 5 MG tablet Take 1 tablet (5 mg total) by mouth 3 (three) times daily with meals. Patient taking differently: Take 5 mg by mouth 2 (two) times daily. 12/29/21   Rai, Ripudeep Raliegh Ip, MD  mirabegron ER (MYRBETRIQ) 25 MG TB24 tablet Take 25 mg by mouth daily.    [provider]  Multiple Vitamins-Minerals (CERTAGEN PO) Take 1 tablet by mouth daily.    [provider]  Nutritional Supplements (ARGINAID) PACK Take 1 packet by mouth daily.    [provider]  olopatadine (PATADAY) 0.1 % ophthalmic solution Place 1 drop into both eyes every 6 (six) hours as needed for allergies.    [provider]  polyethylene glycol (MIRALAX / GLYCOLAX) 17 g packet Take 17 g by mouth daily as needed for moderate constipation. 12/29/21   Rai, Vernelle Emerald, MD  promethazine (PHENERGAN) 12.5 MG suppository Place 12.5 mg rectally every 12 (twelve) hours as needed for nausea or vomiting.    [provider]  rizatriptan (MAXALT) 10 MG tablet Take 10 mg by mouth daily as needed for migraine.    [provider]  tamsulosin (FLOMAX) 0.4 MG CAPS capsule Take 1 capsule (0.4 mg total) by mouth daily after supper. 11/19/21   Roxan Hockey, MD  topiramate (TOPAMAX) 50 MG tablet Take 50 mg by mouth 2 (two) times daily.    [provider]  vitamin C (ASCORBIC ACID) 500 MG tablet Take 500 mg by mouth 2 (two) times daily.    [provider]  Vitamin D, Ergocalciferol, (DRISDOL) 1.25 MG (50000 UNIT) CAPS capsule Take 50,000 Units by mouth every Saturday.    [provider]    Allergies:   Allergies  Allergen Reactions   Codeine Nausea Only    Listed on MAR   Invanz [Ertapenem] Other (See Comments)    Patient states allergy but is not certain    Propranolol Hcl     Listed on MAR   Rocephin [Ceftriaxone] Hives   Vancomycin Hives    Social History:  reports that she has quit smoking. She has never used smokeless tobacco. She reports that she does not currently use alcohol. She reports that she does not use drugs.  Family History: Family History  Adopted: Yes  Problem Relation Age of Onset   Heart disease Mother     Physical Exam: Vitals:   06/16/22 1705 06/16/22 2015  BP: 105/74 105/83  Pulse: 80 96  Resp: 16 17  Temp: 97.6 F (36.4 C)   TempSrc: Oral   SpO2: 100% 96%    General:  Alert and oriented times three, well developed and nourished, no distress Eyes: Pink conjunctiva, no scleral icterus ENT: Moist oral mucosa, neck supple, no thyromegaly Lungs: clear to ascultation, no wheeze, no crackles, no use of accessory muscles Cardiovascular: regular rate and rhythm, no regurgitation, no gallops, no murmurs. No carotid bruits, no JVD Abdomen: soft, positive BS, non-tender, non-distended, no organomegaly, not an acute abdomen GU: Indwelling foley, last changed 06/14/22 Neuro: CN II - XII grossly intact Musculoskeletal: B/L LE swelling >4+. Strength B/L LE 0/5, sensation 0/5, strength 5/5 upper extremities Skin: sacral decubitus Psych: appropriate patient  Labs on Admission:  Recent Labs    06/16/22 2109  NA 135  K 3.6  CL 102  CO2 24  GLUCOSE 125*  BUN 27*   CREATININE 0.47  CALCIUM 8.9   Recent Labs    06/16/22 2109  WBC 10.1  HGB 10.4*  HCT 34.5*  MCV 90.6  PLT 219     Radiological Exams on Admission: IR NEPHROSTOMY EXCHANGE RIGHT  Result Date: 06/16/2022 INDICATION: 77 year old with history of paraplegia and obstructive urinary calculi. Patient initially had a right nephrostomy tube placed on 12/24/2021 and subsequently had exchanges and a even new placement due to catheter dislodgement. A nephroureteral catheter was placed on 03/25/2022. The nephrostomy tube has been capped. Patient presents for routine exchange. EXAM: UNSUCCESSFUL EXCHANGE OF RIGHT NEPHROURETERAL CATHETER WITH FLUOROSCOPY Physician: Stephan Minister. Anselm Pancoast, MD COMPARISON:  None Available. MEDICATIONS: 1% lidocaine ANESTHESIA/SEDATION: None CONTRAST:  44mL OMNIPAQUE IOHEXOL 300 MG/ML SOLN - administered into the collecting system(s) FLUOROSCOPY: Radiation Exposure Index (as provided by the fluoroscopic device): 71 mGy Kerma COMPLICATIONS: None immediate. PROCEDURE: The procedure was explained to the patient. The risks and benefits of the procedure were discussed and the patient's questions were addressed. Informed consent was obtained from the patient. Patient was placed prone. The existing catheter and surrounding skin were prepped and draped in sterile fashion. Maximal barrier sterile technique was utilized including caps, mask, sterile gowns, sterile gloves, sterile drape, hand hygiene and skin antiseptic. Contrast was injected through the right nephroureteral catheter. Contrast filled the dilated right renal collecting system and the proximal right ureter. Catheter suture was already broken. The catheter was cut and attempted to remove for a Bentson wire. Bentson wire would not advance into the distal aspect of the nephroureteral catheter. Attempted to advance a stiff Glidewire through the catheter but this was also unsuccessful. Catheter was partially retracted and straightened out but  a wire would not advance through the catheter. As a result, the entire catheter was removed. Immediately following removal of the old catheter, a 5 [place] Kumpe catheter was advanced through the old tract and tried to cannulate the right renal collecting system. These attempts were unsuccessful. Contrast was even injected along the tract. Tortuous old tube tract was identified but contrast was not draining into the renal collecting system. Catheter was even manipulated under ultrasound guidance but unable to get access to the right renal collecting system. Bandage placed over the old drain site. Fluoroscopic images were taken and saved for this procedure. FINDINGS: Old right nephroureteral catheter was well positioned with proximal aspect in the renal pelvic region.  Contrast injection demonstrated moderate to severe dilatation of the right renal calices. Patient has known kidney stones that were poorly characterized on this examination. The distal aspect of stent was within the region of the urinary bladder but contrast was not draining into the bladder. Massive amount of stool noted in the rectum. Wires could not be advanced to the distal aspect of the catheter. After the old catheter was completely removed, 5 French catheter was unable to cannulate the renal collecting system despite multiple attempts using ultrasound and fluoroscopic guidance. IMPRESSION: 1. Unsuccessful exchange of the right nephroureteral catheter. Unable to advance a wire through the catheter and the entire catheter had to be removed without a wire. Unable to cannulate the right renal collecting system through the old tract. 2. Ultrasound and fluoroscopy demonstrated moderate to severe right hydronephrosis and suspect that the right nephroureteral catheter was not adequately draining. Findings discussed with patient's urologist Dr. Alyson Ingles. Patient will need decompression of the right renal collecting system. As a result, the patient was  transferred to the emergency department and plan for a new percutaneous nephrostomy tube placement on 06/17/2022. Electronically Signed   By: Markus Daft M.D.   On: 06/16/2022 21:14    Assessment/Plan Present on Admission:  Nephrolithiasis/severe hydronephrosis  Failed exchange of right nephroureteral catheter.  -Per Dr Moises Blood note (IR). Patient will need a completely new right nephrostomy tube placement. Patient will need IV and antibiotics for this procedure. Will need to have patient go to ED and arrange for new nephrostomy tube placement tomorrow as inpatient. Discussed with patient's urologist, Dr. Alyson Ingles.  -oncall urologist Dr Kathrynn Ducking contacted by EDP, aware and in agreement. -IV Zosyn ordered, n.p.o. at midnight -IR consult placed   Decubitus ulcer of sacral region [facility]) -wound care consult   Essential hypertension -Resume lisinopril  Paraplegia/autonomic dysfunction -Resume midodrine -Resume midodrine   Hyperlipidemia -Zetia, and atorvastatin resumed   Migraine -Resume Topamax and Maxalt   Hypothyroidism -Synthroid resumed   Neurogenic bladder -See above.  Myrbetriq and Flomax resumed  Jaiyon Wander 06/16/2022, 11:01 PM

## 2022-06-16 NOTE — Procedures (Signed)
Interventional Radiology Procedure:   Indications: Routine exchange of right nephroureteral catheter.  Obstructive urinary calculi.  Procedure: Failed exchange of right nephroureteral catheter.  Old catheter was removed.   Findings: Attempted to exchange right nephroureteral catheter.  Distal aspect of catheter appeared to be occluded and could not remove over a wire.  Catheter was removed without a wire and attempted to replace catheter through existing access.  Theses attempts were unsuccessful.  [place] confirmed moderate to severe hydronephrosis.   Complications: None     EBL: Minimal  Plan: Patient will need a completely new right nephrostomy tube placement.  Patient will need IV and antibiotics for this procedure.  Will need to have patient go to ED and arrange for new nephrostomy tube placement tomorrow as inpatient. Discussed with patient's urologist, Dr. Alyson Ingles.   Sandra Bramer R. Anselm Pancoast, MD  Pager: [phone]

## 2022-06-17 ENCOUNTER — Observation Stay (HOSPITAL_COMMUNITY): Payer: Medicare Other

## 2022-06-17 DIAGNOSIS — I10 Essential (primary) hypertension: Secondary | ICD-10-CM

## 2022-06-17 DIAGNOSIS — T83092A Other mechanical complication of nephrostomy catheter, initial encounter: Secondary | ICD-10-CM | POA: Diagnosis not present

## 2022-06-17 DIAGNOSIS — N200 Calculus of kidney: Secondary | ICD-10-CM | POA: Diagnosis not present

## 2022-06-17 DIAGNOSIS — N99528 Other complication of other external stoma of urinary tract: Secondary | ICD-10-CM | POA: Diagnosis not present

## 2022-06-17 DIAGNOSIS — Z978 Presence of other specified devices: Secondary | ICD-10-CM | POA: Diagnosis not present

## 2022-06-17 DIAGNOSIS — L89150 Pressure ulcer of sacral region, unstageable: Secondary | ICD-10-CM

## 2022-06-17 HISTORY — PX: IR NEPHROSTOMY PLACEMENT RIGHT: IMG6064

## 2022-06-17 LAB — CBC WITH DIFFERENTIAL/PLATELET
Abs Immature Granulocytes: 0.03 10*3/uL (ref 0.00–0.07)
Basophils Absolute: 0 10*3/uL (ref 0.0–0.1)
Basophils Relative: 0 %
Eosinophils Absolute: 0.3 10*3/uL (ref 0.0–0.5)
Eosinophils Relative: 3 %
HCT: 32.9 % — ABNORMAL LOW (ref 36.0–46.0)
Hemoglobin: 10 g/dL — ABNORMAL LOW (ref 12.0–15.0)
Immature Granulocytes: 0 %
Lymphocytes Relative: 26 %
Lymphs Abs: 2.4 10*3/uL (ref 0.7–4.0)
MCH: 27.5 pg (ref 26.0–34.0)
MCHC: 30.4 g/dL (ref 30.0–36.0)
MCV: 90.4 fL (ref 80.0–100.0)
Monocytes Absolute: 0.7 10*3/uL (ref 0.1–1.0)
Monocytes Relative: 8 %
Neutro Abs: 5.6 10*3/uL (ref 1.7–7.7)
Neutrophils Relative %: 63 %
Platelets: 222 10*3/uL (ref 150–400)
RBC: 3.64 MIL/uL — ABNORMAL LOW (ref 3.87–5.11)
RDW: 15.5 % (ref 11.5–15.5)
WBC: 9.1 10*3/uL (ref 4.0–10.5)
nRBC: 0 % (ref 0.0–0.2)

## 2022-06-17 LAB — BASIC METABOLIC PANEL
Anion gap: 7 (ref 5–15)
BUN: 30 mg/dL — ABNORMAL HIGH (ref 8–23)
CO2: 24 mmol/L (ref 22–32)
Calcium: 8.7 mg/dL — ABNORMAL LOW (ref 8.9–10.3)
Chloride: 104 mmol/L (ref 98–111)
Creatinine, Ser: 0.53 mg/dL (ref 0.44–1.00)
GFR, Estimated: >60 mL/min (ref >60)
Glucose, Bld: 117 mg/dL — ABNORMAL HIGH (ref 70–99)
Potassium: 3.8 mmol/L (ref 3.5–5.1)
Sodium: 135 mmol/L (ref 135–145)

## 2022-06-17 LAB — MRSA NEXT GEN BY PCR, NASAL: MRSA by PCR Next Gen: DETECTED — AB

## 2022-06-17 LAB — PROTIME-INR
INR: 1.2 (ref 0.8–1.2)
Prothrombin Time: 14.7 seconds (ref 11.4–15.2)

## 2022-06-17 MED ORDER — CHLORHEXIDINE GLUCONATE CLOTH 2 % EX PADS
6.0000 | MEDICATED_PAD | Freq: Every day | CUTANEOUS | Status: DC
  Administered 2022-06-17 – 2022-06-18 (×2): 6 via TOPICAL

## 2022-06-17 MED ORDER — LIDOCAINE-EPINEPHRINE 1 %-1:100000 IJ SOLN
INTRAMUSCULAR | Status: AC
  Filled 2022-06-17: qty 1

## 2022-06-17 MED ORDER — LEVOTHYROXINE SODIUM 50 MCG PO TABS
150.0000 ug | ORAL_TABLET | Freq: Every day | ORAL | Status: DC
  Administered 2022-06-18: 150 ug via ORAL
  Filled 2022-06-17: qty 1

## 2022-06-17 MED ORDER — TAMSULOSIN HCL 0.4 MG PO CAPS
0.4000 mg | ORAL_CAPSULE | Freq: Every day | ORAL | Status: DC
  Administered 2022-06-17: 0.4 mg via ORAL
  Filled 2022-06-17: qty 1

## 2022-06-17 MED ORDER — IOHEXOL 300 MG/ML  SOLN
50.0000 mL | Freq: Once | INTRAMUSCULAR | Status: AC | PRN
  Administered 2022-06-17: 20 mL

## 2022-06-17 MED ORDER — ACETAMINOPHEN 650 MG RE SUPP: 650.0000 mg | Freq: Four times a day (QID) | RECTAL | Status: DC | PRN

## 2022-06-17 MED ORDER — SILVER NITRATE-POT NITRATE 75-25 % EX MISC
CUTANEOUS | Status: AC
  Filled 2022-06-17: qty 10

## 2022-06-17 MED ORDER — MIDODRINE HCL 5 MG PO TABS
5.0000 mg | ORAL_TABLET | Freq: Three times a day (TID) | ORAL | Status: DC
  Administered 2022-06-17 – 2022-06-18 (×3): 5 mg via ORAL
  Filled 2022-06-17 (×3): qty 1

## 2022-06-17 MED ORDER — ONDANSETRON HCL 4 MG PO TABS: 4.0000 mg | ORAL_TABLET | Freq: Four times a day (QID) | ORAL | Status: DC | PRN

## 2022-06-17 MED ORDER — SODIUM CHLORIDE 0.9% FLUSH
5.0000 mL | Freq: Three times a day (TID) | INTRAVENOUS | Status: DC
  Administered 2022-06-17 – 2022-06-18 (×2): 5 mL

## 2022-06-17 MED ORDER — ENOXAPARIN SODIUM 40 MG/0.4ML IJ SOSY
40.0000 mg | PREFILLED_SYRINGE | INTRAMUSCULAR | Status: DC
  Filled 2022-06-17: qty 0.4

## 2022-06-17 MED ORDER — CARVEDILOL 3.125 MG PO TABS: 6.2500 mg | ORAL_TABLET | Freq: Two times a day (BID) | ORAL | Status: DC

## 2022-06-17 MED ORDER — FENTANYL CITRATE (PF) 100 MCG/2ML IJ SOLN
INTRAMUSCULAR | Status: AC
  Filled 2022-06-17: qty 2

## 2022-06-17 MED ORDER — TOPIRAMATE 25 MG PO TABS
50.0000 mg | ORAL_TABLET | Freq: Two times a day (BID) | ORAL | Status: DC
  Administered 2022-06-17 – 2022-06-18 (×2): 50 mg via ORAL
  Filled 2022-06-17 (×2): qty 2

## 2022-06-17 MED ORDER — MIRABEGRON ER 25 MG PO TB24
25.0000 mg | ORAL_TABLET | Freq: Every day | ORAL | Status: DC
  Administered 2022-06-17 – 2022-06-18 (×2): 25 mg via ORAL
  Filled 2022-06-17 (×2): qty 1

## 2022-06-17 MED ORDER — ACETAMINOPHEN 325 MG PO TABS
650.0000 mg | ORAL_TABLET | Freq: Four times a day (QID) | ORAL | Status: DC | PRN
  Administered 2022-06-17 – 2022-06-18 (×3): 650 mg via ORAL
  Filled 2022-06-17 (×3): qty 2

## 2022-06-17 MED ORDER — MORPHINE SULFATE (PF) 2 MG/ML IV SOLN
1.0000 mg | INTRAVENOUS | Status: DC | PRN
  Administered 2022-06-17 – 2022-06-18 (×2): 1 mg via INTRAVENOUS
  Filled 2022-06-17 (×3): qty 1

## 2022-06-17 MED ORDER — ENOXAPARIN SODIUM 40 MG/0.4ML IJ SOSY: 40.0000 mg | PREFILLED_SYRINGE | INTRAMUSCULAR | Status: DC

## 2022-06-17 MED ORDER — PIPERACILLIN-TAZOBACTAM 3.375 G IVPB
3.3750 g | Freq: Three times a day (TID) | INTRAVENOUS | Status: DC
  Administered 2022-06-17 – 2022-06-18 (×4): 3.375 g via INTRAVENOUS
  Filled 2022-06-17 (×5): qty 50

## 2022-06-17 MED ORDER — ONDANSETRON HCL 4 MG/2ML IJ SOLN: 4.0000 mg | Freq: Four times a day (QID) | INTRAMUSCULAR | Status: DC | PRN

## 2022-06-17 MED ORDER — ATORVASTATIN CALCIUM 10 MG PO TABS
10.0000 mg | ORAL_TABLET | Freq: Every evening | ORAL | Status: DC
  Administered 2022-06-17: 10 mg via ORAL
  Filled 2022-06-17: qty 1

## 2022-06-17 MED ORDER — MIDAZOLAM HCL 2 MG/2ML IJ SOLN
INTRAMUSCULAR | Status: AC
  Filled 2022-06-17: qty 2

## 2022-06-17 NOTE — Consult Note (Addendum)
[facility] Nurse Consult Note: Reason for Consult: Consult requested for buttocks. 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  Scattered partial thickness wounds to bilat buttocks and upper posterior thighs; appearance is consistent with moisture associated skin damage, red and moist.  ICD-10 CM Codes for Irritant Dermatitis L24A2 - Due to fecal, urinary or dual incontinence  Pt is currently incontinent of urine and stool, cleaned and changed the linens. It will be difficult to promote healing while she is incontinent and it is difficult to keep the affected areas from becoming soiled.  Pressure Injury POA: Yes Dressing procedure/placement/frequency: Topical treatment orders provided for bedside nurses to perform as follows: Foam dressing to buttocks wounds and posterior thigh wounds, change Q 3 days or PRN soiling. Please re-consult if further assistance is needed.  Thank-you,  Julien Girt MSN, [place], [place], [place], [place]

## 2022-06-17 NOTE — Progress Notes (Signed)
Pharmacy Antibiotic Note  EUFRACIA HERNDON is a 77 y.o. female admitted on 06/16/2022 with history of COPD, diabetes, hypertension here presenting with postop problem. Patient noted her to right nephrostomy exchange with IR earlier today. However they were unable to exchange it and patient was noted to have severe hydro on the right side. Marland Kitchen  Pharmacy has been consulted for zosyn dosing.  Plan: Zosyn 3.375g IV q8h (4 hour infusion). Follow renal function and clinical course     Temp (24hrs), Avg:97.6 F (36.4 C), Min:97.6 F (36.4 C), Max:97.6 F (36.4 C)  Recent Labs  Lab 06/16/22 2109  WBC 10.1  CREATININE 0.47    CrCl cannot be calculated (Unknown ideal weight.).    Allergies  Allergen Reactions   Codeine Nausea Only    Listed on MAR   Invanz [Ertapenem] Other (See Comments)    Patient states allergy but is not certain    Propranolol Hcl     Listed on MAR   Rocephin [Ceftriaxone] Hives   Vancomycin Hives     Thank you for allowing pharmacy to be a part of this patient's care.  Dolly Rias RPh 06/17/2022, 12:15 AM

## 2022-06-17 NOTE — Consult Note (Signed)
Urology Consult   Physician requesting consult: Estill Cotta, MD  Reason for consult: Right renal and ureteral stones  History of Present Illness: [name] is a 77 y.o. with a history of right ureteral and a very large right UPJ stone.  She is confined to a stretcher.  Her right UPJ stone measures 3.7 cm.  She is followed by Dr. Alyson Ingles who is planning stage right ureteroscopy.  She had a right nephrostomy tube exchange on 03/25/2022.  She had attempt at exchange on 06/16/2022 that was unsuccessful.  She required admission to the ED today and new access was obtained today 06/17/2022.  Right nephrostomy tube is draining clear yellow urine.  She denies abdominal pain or flank pain, fevers or chills.   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   Current Outpatient Medications on File Prior to Encounter  Medication Sig Dispense Refill   acetaminophen (TYLENOL) 325 MG tablet Take 1 tablet (325 mg total) by mouth every 6 (six) hours as needed for mild pain, moderate pain, fever or headache. *May take one every 6 hours as needed for pain (Patient taking differently: Take 650 mg by mouth every 6 (six) hours as needed (for pain).)     ARTIFICIAL TEARS 1.4 % ophthalmic  solution Place 1 drop into both eyes every 12 (twelve) hours as needed for dry eyes.     atorvastatin (LIPITOR) 10 MG tablet Take 10 mg by mouth every evening.     azelastine (ASTELIN) 0.1 % nasal spray Place 2 sprays into both nostrils 2 (two) times daily.     b complex vitamins tablet Take 1 tablet by mouth daily.     Calcium Carb-Cholecalciferol (CALCIUM CARBONATE-VITAMIN D3 PO) Take 1 tablet by mouth in the morning and at bedtime.     CALMOSEPTINE 0.44-20.6 % OINT Apply 1 application  topically every 8 (eight) hours as needed (for moisture-associated skin damage- right rear thigh/buttocks). Apply every 8 hours as needed for for moisture-associated skin damage (right rear thigh/buttocks) and at bedtime (SCHEDULED)     carvedilol (COREG) 6.25 MG tablet Take 6.25 mg by mouth See admin instructions. Take 6.25 mg by mouth two times a day and Hold for SBP <100 or HR < 60     Cranberry 500 MG TABS Take 500 mg by mouth daily.     dextromethorphan-guaiFENesin (MUCINEX DM) 30-600 MG per 12 hr tablet Take 1 tablet by mouth every 12 (twelve) hours as needed for cough.     docusate sodium (COLACE) 100 MG capsule Take 200 mg by mouth at bedtime.     ezetimibe (ZETIA) 10 MG tablet Take 10 mg by mouth at bedtime.  famotidine (PEPCID) 20 MG tablet Take 20 mg by mouth 2 (two) times daily.     ferrous sulfate 325 (65 FE) MG EC tablet Take 325 mg by mouth 3 (three) times daily with meals.     fluticasone (FLONASE) 50 MCG/ACT nasal spray Place 2 sprays into both nostrils in the morning.     furosemide (LASIX) 40 MG tablet Take 40 mg by mouth in the morning.     gabapentin (NEURONTIN) 300 MG capsule Take 300 mg by mouth 3 (three) times daily.     Lactobacillus (ACIDOPHILUS) CAPS capsule Take 1 capsule by mouth 2 (two) times daily.     lisinopril (ZESTRIL) 2.5 MG tablet Take 1.25 mg by mouth daily.     loratadine (CLARITIN) 10 MG tablet Take 10 mg by mouth daily as needed (for seasonal allergies).     Magnesium  Oxide -Mg Supplement (MAG-OXIDE) 200 MG TABS Take 400 mg by mouth in the morning.     midodrine (PROAMATINE) 5 MG tablet Take 1 tablet (5 mg total) by mouth 3 (three) times daily with meals. (Patient taking differently: Take 5 mg by mouth 2 (two) times daily.)     mirabegron ER (MYRBETRIQ) 25 MG TB24 tablet Take 25 mg by mouth in the morning.     Multiple Vitamins-Minerals (CERTAGEN PO) Take 1 tablet by mouth daily with breakfast.     Nutritional Supplements (ARGINAID) PACK Take 1 packet by mouth daily.     olopatadine (PATADAY) 0.1 % ophthalmic solution Place 1 drop into both eyes every 6 (six) hours as needed for allergies.     polyethylene glycol (MIRALAX / GLYCOLAX) 17 g packet Take 17 g by mouth daily as needed for moderate constipation. (Patient taking differently: Take 17 g by mouth daily as needed (for constipation- mix as directed).) 14 each 0   potassium chloride (KLOR-CON) 10 MEQ tablet Take 10 mEq by mouth daily.     rizatriptan (MAXALT) 10 MG tablet Take 10 mg by mouth daily as needed for migraine.     sodium hypochlorite (DAKIN'S 1/4 STRENGTH) 0.125 % SOLN Irrigate with 1 Application as directed See admin instructions. Apply to wound bed topically every day shift for healing     SYNTHROID 200 MCG tablet Take 200 mcg by mouth daily before breakfast.     tamsulosin (FLOMAX) 0.4 MG CAPS capsule Take 1 capsule (0.4 mg total) by mouth daily after supper. (Patient taking differently: Take 0.4 mg by mouth every evening.) 30 capsule 3   topiramate (TOPAMAX) 50 MG tablet Take 50 mg by mouth 2 (two) times daily.     vitamin C (ASCORBIC ACID) 500 MG tablet Take 500 mg by mouth 2 (two) times daily.     Vitamin D, Ergocalciferol, (DRISDOL) 1.25 MG (50000 UNIT) CAPS capsule Take 50,000 Units by mouth every Saturday.     ipratropium-albuterol (DUONEB) 0.5-2.5 (3) MG/3ML SOLN Take 3 mLs by nebulization every 6 (six) hours as needed (shortness of breath). (Patient not taking: Reported on 06/17/2022) 360  mL 2     Scheduled Meds:  atorvastatin  10 mg Oral QPM   Chlorhexidine Gluconate Cloth  6 each Topical Daily   [START ON 06/18/2022] enoxaparin (LOVENOX) injection  40 mg Subcutaneous Q24H   levothyroxine  150 mcg Oral Q0600   midodrine  5 mg Oral TID WC   mirabegron ER  25 mg Oral Daily   tamsulosin  0.4 mg Oral QPC supper   topiramate  50 mg Oral BID  Continuous Infusions:  piperacillin-tazobactam (ZOSYN)  IV 3.375 g (06/17/22 1443)   PRN Meds:.acetaminophen **OR** acetaminophen, morphine injection, ondansetron **OR** ondansetron (ZOFRAN) IV  Allergies:  Allergies  Allergen Reactions   Codeine Nausea Only and Other (See Comments)    "Allergic," per facility's paperwork   Invanz [Ertapenem] Other (See Comments)    "Allergic," per facility's paperwork   Propranolol Hcl Other (See Comments)    "Allergic," per facility's paperwork   Rocephin [Ceftriaxone] Hives and Other (See Comments)    "Allergic," per facility's paperwork   Vancomycin Hives and Other (See Comments)    "Allergic," per facility's paperwork    Family History  Adopted: Yes  Problem Relation Age of Onset   Heart disease Mother     Social History:  reports that she has quit smoking. She has never used smokeless tobacco. She reports that she does not currently use alcohol. She reports that she does not use drugs.  ROS: A complete review of systems was performed.  All systems are negative except for pertinent findings as noted.  Physical Exam:  Vital signs in last 24 hours: Temp:  [98.2 F (36.8 C)-99.6 F (37.6 C)] 98.2 F (36.8 C) (03/22 1357) Pulse Rate:  [88-96] 88 (03/22 1357) Resp:  [17-18] 18 (03/22 1357) BP: (105-128)/(68-83) 127/80 (03/22 1357) SpO2:  [96 %-100 %] 99 % (03/22 1357) Weight:  [97.1 kg] 97.1 kg (03/22 0113) Constitutional:  Alert and oriented, No acute distress Cardiovascular: Regular rate and rhythm Respiratory: Normal respiratory effort, Lungs clear bilaterally GI: Abdomen  is soft, nontender, nondistended, no abdominal masses GU: No CVA tenderness; R PCN draining clear yellow urine Neurologic: Grossly intact, no focal deficits Psychiatric: Normal mood and affect  Laboratory Data:  Recent Labs    06/16/22 2109 06/17/22 0449  WBC 10.1 9.1  HGB 10.4* 10.0*  HCT 34.5* 32.9*  PLT 219 222    Recent Labs    06/16/22 2109 06/17/22 0449  NA 135 135  K 3.6 3.8  CL 102 104  GLUCOSE 125* 117*  BUN 27* 30*  CALCIUM 8.9 8.7*  CREATININE 0.47 0.53     Results for orders placed or performed during the hospital encounter of 06/16/22 (from the past 24 hour(s))  CBC     Status: Abnormal   Collection Time: 06/16/22  9:09 PM  Result Value Ref Range   WBC 10.1 4.0 - 10.5 K/uL   RBC 3.81 (L) 3.87 - 5.11 MIL/uL   Hemoglobin 10.4 (L) 12.0 - 15.0 g/dL   HCT 34.5 (L) 36.0 - 46.0 %   MCV 90.6 80.0 - 100.0 fL   MCH 27.3 26.0 - 34.0 pg   MCHC 30.1 30.0 - 36.0 g/dL   RDW 15.3 11.5 - 15.5 %   Platelets 219 150 - 400 K/uL   nRBC 0.0 0.0 - 0.2 %  Basic metabolic panel     Status: Abnormal   Collection Time: 06/16/22  9:09 PM  Result Value Ref Range   Sodium 135 135 - 145 mmol/L   Potassium 3.6 3.5 - 5.1 mmol/L   Chloride 102 98 - 111 mmol/L   CO2 24 22 - 32 mmol/L   Glucose, Bld 125 (H) 70 - 99 mg/dL   BUN 27 (H) 8 - 23 mg/dL   Creatinine, Ser 0.47 0.44 - 1.00 mg/dL   Calcium 8.9 8.9 - 10.3 mg/dL   GFR, Estimated >60 >60 mL/min   Anion gap 9 5 - 15  MRSA Next Gen by PCR, Nasal  Status: Abnormal   Collection Time: 06/17/22  1:14 AM   Specimen: Nasal Mucosa; Nasal Swab  Result Value Ref Range   MRSA by PCR Next Gen DETECTED (A) NOT DETECTED  Protime-INR     Status: None   Collection Time: 06/17/22  4:49 AM  Result Value Ref Range   Prothrombin Time 14.7 11.4 - 15.2 seconds   INR 1.2 0.8 - 1.2  Basic metabolic panel     Status: Abnormal   Collection Time: 06/17/22  4:49 AM  Result Value Ref Range   Sodium 135 135 - 145 mmol/L   Potassium 3.8 3.5 -  5.1 mmol/L   Chloride 104 98 - 111 mmol/L   CO2 24 22 - 32 mmol/L   Glucose, Bld 117 (H) 70 - 99 mg/dL   BUN 30 (H) 8 - 23 mg/dL   Creatinine, Ser 0.53 0.44 - 1.00 mg/dL   Calcium 8.7 (L) 8.9 - 10.3 mg/dL   GFR, Estimated >60 >60 mL/min   Anion gap 7 5 - 15  CBC with Differential/Platelet     Status: Abnormal   Collection Time: 06/17/22  4:49 AM  Result Value Ref Range   WBC 9.1 4.0 - 10.5 K/uL   RBC 3.64 (L) 3.87 - 5.11 MIL/uL   Hemoglobin 10.0 (L) 12.0 - 15.0 g/dL   HCT 32.9 (L) 36.0 - 46.0 %   MCV 90.4 80.0 - 100.0 fL   MCH 27.5 26.0 - 34.0 pg   MCHC 30.4 30.0 - 36.0 g/dL   RDW 15.5 11.5 - 15.5 %   Platelets 222 150 - 400 K/uL   nRBC 0.0 0.0 - 0.2 %   Neutrophils Relative % 63 %   Neutro Abs 5.6 1.7 - 7.7 K/uL   Lymphocytes Relative 26 %   Lymphs Abs 2.4 0.7 - 4.0 K/uL   Monocytes Relative 8 %   Monocytes Absolute 0.7 0.1 - 1.0 K/uL   Eosinophils Relative 3 %   Eosinophils Absolute 0.3 0.0 - 0.5 K/uL   Basophils Relative 0 %   Basophils Absolute 0.0 0.0 - 0.1 K/uL   Immature Granulocytes 0 %   Abs Immature Granulocytes 0.03 0.00 - 0.07 K/uL   Recent Results (from the past 240 hour(s))  MRSA Next Gen by PCR, Nasal     Status: Abnormal   Collection Time: 06/17/22  1:14 AM   Specimen: Nasal Mucosa; Nasal Swab  Result Value Ref Range Status   MRSA by PCR Next Gen DETECTED (A) NOT DETECTED Final    Comment: (NOTE) The GeneXpert MRSA Assay (FDA approved for NASAL specimens only), is one component of a comprehensive MRSA colonization surveillance program. It is not intended to diagnose MRSA infection nor to guide or monitor treatment for MRSA infections. Test performance is not FDA approved in patients less than 44 years old. Performed at [facility], [place] [address] [place], [place] [address]     Renal Function: Recent Labs    06/16/22 2109 06/17/22 0449  CREATININE 0.47 0.53   Estimated Creatinine Clearance: 73.8 mL/min (by C-G formula  based on SCr of 0.53 mg/dL).  Radiologic Imaging: IR NEPHROSTOMY PLACEMENT RIGHT  Result Date: 06/17/2022 INDICATION: 77 year old woman with history of paraplegia and obstructive urinary calculi underwent initial right nephrostomy drain placement on 12/24/2021. Due to history of frequent drain dislodgement, it was converted to a nephroureteral catheter on 03/25/2022. She returned to IR on 06/16/2022 for exchange, however the drain was clogged and had to be removed. She  returns today for new insertion. EXAM: Ultrasound and fluoroscopy guided right percutaneous nephroureteral catheter placement. COMPARISON:  06/16/2022 03/25/2022 MEDICATIONS: Zosyn 3.375 IV; The antibiotic was administered in an appropriate time frame prior to skin puncture. ANESTHESIA/SEDATION: None CONTRAST:  20 mL of Omnipaque 300-administered into the collecting system(s) FLUOROSCOPY TIME:  Radiation Exposure Index (as provided by the fluoroscopic device): 19 mGy Kerma COMPLICATIONS: None immediate. PROCEDURE: Informed written consent was obtained from the patient after a thorough discussion of the procedural risks, benefits and alternatives. All questions were addressed. Maximal Sterile Barrier Technique was utilized including caps, mask, sterile gowns, sterile gloves, sterile drape, hand hygiene and skin antiseptic. A timeout was performed prior to the initiation of the procedure. Patient positioned prone on the procedure table. 21 gauge needle utilized to access the lower pole of the right kidney utilizing continuous ultrasound guidance. Contrast administered through the 21 gauge needle under fluoroscopy confirmed appropriate access of the renal collecting system. Large upper pole calculus was again noted. 21 gauge needle exchanged for transitional dilator set over 0.018 inch guidewire. Transitional set exchanged for 0.035 inch stiff glidewire. Stiff glidewire advanced to the level of the bladder utilizing Kumpe catheter and fluoroscopic  guidance. Contrast administered through the Kumpe catheter confirmed intraluminal positioning within bladder lumen. Kumpe catheter was removed and 26 cm 10 French nephroureteral drain was inserted over the stiff glidewire. 50 mL of purulent material was aspirated. Samples were sent for Gram stain and culture. The distal pigtail was formed within the bladder lumen and the proximal pigtail was formed in the renal pelvis, confirmed by administering contrast under fluoroscopy. Drain was flushed and attached to bag. IMPRESSION: 1. Successful placement of right nephroureteral catheter (26 cm 10 cm. 2. 50 mL of purulent material was aspirated from the right renal collecting system. Samples were sent for Gram stain and culture. PLAN: 1. Maintain drain to bag for at least 1 week to allow for infection to clear. 2. May consider capping drain after 1 week, after urine has cleared. 3. Drain should be flushed daily with 10 mL of saline. 4. Patient should return in 8 weeks for routine exchange. Electronically Signed   By: Miachel Roux M.D.   On: 06/17/2022 14:59   IR NEPHROSTOMY EXCHANGE RIGHT  Result Date: 06/16/2022 INDICATION: 77 year old with history of paraplegia and obstructive urinary calculi. Patient initially had a right nephrostomy tube placed on 12/24/2021 and subsequently had exchanges and a even new placement due to catheter dislodgement. A nephroureteral catheter was placed on 03/25/2022. The nephrostomy tube has been capped. Patient presents for routine exchange. EXAM: UNSUCCESSFUL EXCHANGE OF RIGHT NEPHROURETERAL CATHETER WITH FLUOROSCOPY Physician: Stephan Minister. Anselm Pancoast, MD COMPARISON:  None Available. MEDICATIONS: 1% lidocaine ANESTHESIA/SEDATION: None CONTRAST:  71mL OMNIPAQUE IOHEXOL 300 MG/ML SOLN - administered into the collecting system(s) FLUOROSCOPY: Radiation Exposure Index (as provided by the fluoroscopic device): 71 mGy Kerma COMPLICATIONS: None immediate. PROCEDURE: The procedure was explained to the  patient. The risks and benefits of the procedure were discussed and the patient's questions were addressed. Informed consent was obtained from the patient. Patient was placed prone. The existing catheter and surrounding skin were prepped and draped in sterile fashion. Maximal barrier sterile technique was utilized including caps, mask, sterile gowns, sterile gloves, sterile drape, hand hygiene and skin antiseptic. Contrast was injected through the right nephroureteral catheter. Contrast filled the dilated right renal collecting system and the proximal right ureter. Catheter suture was already broken. The catheter was cut and attempted to remove for a Bentson wire.  Bentson wire would not advance into the distal aspect of the nephroureteral catheter. Attempted to advance a stiff Glidewire through the catheter but this was also unsuccessful. Catheter was partially retracted and straightened out but a wire would not advance through the catheter. As a result, the entire catheter was removed. Immediately following removal of the old catheter, a 5 [place] Kumpe catheter was advanced through the old tract and tried to cannulate the right renal collecting system. These attempts were unsuccessful. Contrast was even injected along the tract. Tortuous old tube tract was identified but contrast was not draining into the renal collecting system. Catheter was even manipulated under ultrasound guidance but unable to get access to the right renal collecting system. Bandage placed over the old drain site. Fluoroscopic images were taken and saved for this procedure. FINDINGS: Old right nephroureteral catheter was well positioned with proximal aspect in the renal pelvic region. Contrast injection demonstrated moderate to severe dilatation of the right renal calices. Patient has known kidney stones that were poorly characterized on this examination. The distal aspect of stent was within the region of the urinary bladder but contrast was  not draining into the bladder. Massive amount of stool noted in the rectum. Wires could not be advanced to the distal aspect of the catheter. After the old catheter was completely removed, 5 French catheter was unable to cannulate the renal collecting system despite multiple attempts using ultrasound and fluoroscopic guidance. IMPRESSION: 1. Unsuccessful exchange of the right nephroureteral catheter. Unable to advance a wire through the catheter and the entire catheter had to be removed without a wire. Unable to cannulate the right renal collecting system through the old tract. 2. Ultrasound and fluoroscopy demonstrated moderate to severe right hydronephrosis and suspect that the right nephroureteral catheter was not adequately draining. Findings discussed with patient's urologist Dr. Alyson Ingles. Patient will need decompression of the right renal collecting system. As a result, the patient was transferred to the emergency department and plan for a new percutaneous nephrostomy tube placement on 06/17/2022. Electronically Signed   By: Markus Daft M.D.   On: 06/16/2022 21:14    I independently reviewed the above imaging studies.  Impression/Recommendation: Right ureteral and renal stones s/p right nephrostomy tube  -Appreciate interventional radiology placing new right nephrostomy tube. -She has tolerated the procedure well and may be discharged from a urologic perspective.  She may follow-up with Dr. Alyson Ingles to undergo treatment of her urolithiasis.  Matt R. Arend Bahl MD 06/17/2022, 6:02 PM  [facility]  Pager: [phone]  CC: Estill Cotta, MD

## 2022-06-17 NOTE — Progress Notes (Signed)
Referring Physician(s): McKenzie,P  Supervising Physician: Mir, Sharen Heck  Patient Status:  [facility] - In-pt  Chief Complaint:  Obstructive urinary calculi, failed exchange of right nephroureteral catheter  Subjective: Patient known to IR service from spinal arteriogram in 2008, L2-3 disc space aspiration in 2016, right nephrostomy placement on 12/24/2021 for pyonephrosis/nephrolithiasis, colostomy exchange on 02/16/2022, replacement of right nephrostomy on 03/10/2022 secondary to dislodgment, right nephroureteral catheter insertion on 03/25/2022 with capping of drain.  She presented for routine nephroureteral catheter exchange yesterday however we were unable to advance a wire through the catheter and the entire catheter had to be removed without a wire.  We were unable to cannulate the right renal collecting system through the old tract.  Ultrasound demonstrated moderate to severe right hydronephrosis.  Case reviewed with patient's urologist Dr. Alyson Ingles.  Plans now underway for placement of new right percutaneous nephrostomy.  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  She is currently afebrile, WBC normal, hemoglobin stable at 10, platelets normal, creatinine normal, PT/INR normal.  Currently on IV Zosyn.  She denies fever, CP, dyspnea, cough, N/V; she does have HA/neck pain and is thirsty. 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 Authorizing Provider  acetaminophen (TYLENOL) 325 MG tablet Take 1 tablet (325 mg total) by mouth every 6 (six) hours as needed for mild pain, moderate pain, fever or headache. *May take one every 6 hours as needed for pain Patient taking differently: Take 650 mg by mouth every 6 (six) hours as needed for mild pain, moderate pain, fever or headache. 12/29/21   Rai, Ripudeep Raliegh Ip, MD  Amino Acids-Protein Hydrolys (FEEDING SUPPLEMENT, PRO-STAT 64,) LIQD Take 30 mLs by mouth 3 (three) times daily with meals.    [provider]  atorvastatin (LIPITOR) 10 MG tablet Take 10 mg by mouth every evening.    [provider]  azelastine (ASTELIN) 0.1 % nasal spray Place 2 sprays into both nostrils 2 (two) times daily. Use in each nostril as directed    [provider]  b complex vitamins tablet Take 1 tablet by mouth daily.    [provider]  Calcium Carb-Cholecalciferol (CALCIUM CARBONATE-VITAMIN D3 PO) Take 1 tablet by mouth in the morning and at bedtime.    [provider]  carvedilol (COREG) 6.25 MG tablet Take 6.25 mg by mouth 2 (two) times daily. Hold for SBP <100 or HR < 60    [provider]  Cranberry 500 MG  TABS Take 1 capsule by mouth daily.    [provider]  dextromethorphan-guaiFENesin (MUCINEX DM) 30-600 MG per 12 hr tablet Take 1 tablet by mouth every 12 (twelve) hours as needed for cough.    [provider]  docusate sodium (COLACE) 100 MG capsule Take 200 mg by mouth at bedtime.    [provider]  Eyelid Cleansers (OCUSOFT EYELID CLEANSING) PADS Apply 1 Application topically daily.    [provider]  ezetimibe (ZETIA) 10 MG tablet Take 10 mg by mouth at bedtime.     [provider]  famotidine (PEPCID) 20 MG tablet Take 20 mg by mouth 2 (two) times daily.    [provider]  ferrous sulfate 325 (65 FE) MG EC tablet Take 325 mg by mouth 3 (three) times daily with meals.    [provider]  fluticasone (FLONASE) 50 MCG/ACT nasal spray Place 2 sprays into both nostrils daily.  09/03/14   [provider]  gabapentin (NEURONTIN) 300 MG capsule Take 300 mg by mouth every 8 (eight) hours.    [provider]  ipratropium-albuterol (DUONEB) 0.5-2.5 (3) MG/3ML SOLN Take 3 mLs by nebulization every 6 (six) hours as needed (shortness of breath). 11/19/21   Roxan Hockey, MD  Lactobacillus (ACIDOPHILUS PO) Take 1 tablet by mouth in the morning and at bedtime.    [provider]  levothyroxine (SYNTHROID) 150 MCG tablet Take 150 mcg by mouth daily.    [provider]  lisinopril (ZESTRIL) 2.5 MG tablet Take 1.25 mg by mouth daily.    [provider]  midodrine (PROAMATINE) 5 MG tablet Take 1 tablet (5 mg total) by mouth 3 (three) times daily with meals. Patient taking differently: Take 5 mg by mouth 2 (two) times daily. 12/29/21   Rai, Ripudeep Raliegh Ip, MD  mirabegron ER (MYRBETRIQ) 25 MG TB24 tablet Take 25 mg by mouth daily.    [provider]  Multiple Vitamins-Minerals (CERTAGEN PO) Take 1 tablet by mouth daily.    [provider]  Nutritional Supplements (ARGINAID) PACK Take 1 packet by mouth daily.    [provider]  olopatadine (PATADAY) 0.1 % ophthalmic solution Place 1 drop into both eyes every 6 (six) hours as needed for allergies.    [provider]  polyethylene glycol (MIRALAX / GLYCOLAX) 17 g packet Take 17 g by mouth daily as needed for moderate constipation.  12/29/21   Rai, Vernelle Emerald, MD  promethazine (PHENERGAN) 12.5 MG suppository Place 12.5 mg rectally every 12 (twelve) hours as needed for nausea or vomiting.    [provider]  rizatriptan (MAXALT) 10 MG tablet Take 10 mg by mouth daily as needed for migraine.    [provider]  tamsulosin (FLOMAX) 0.4 MG CAPS capsule Take 1 capsule (0.4 mg total) by mouth daily after supper. 11/19/21   Roxan Hockey, MD  topiramate (TOPAMAX) 50 MG tablet Take 50 mg by mouth 2 (two) times daily.    [provider]  vitamin C (ASCORBIC ACID) 500 MG tablet Take 500 mg by mouth 2 (two) times daily.    [provider]  Vitamin D, Ergocalciferol, (DRISDOL) 1.25 MG (50000 UNIT) CAPS capsule Take 50,000 Units by mouth every Saturday.    [provider]     Vital Signs: BP 128/68 (BP Location: Left Arm)   Pulse 91   Temp 98.6 F (37 C) (Oral)   Resp 18   Ht 5\' 7"  (1.702 m)   Wt  214 lb 1.6 oz (97.1 kg)   SpO2 100%   BMI 33.53 kg/m   Physical Exam: awake/alert; chest- CTA bilat ant; heart- RRR,+ soft murmur; abd- obese, soft,+BS; bilat LE edema, sacral decubitus ulcer  Imaging: IR NEPHROSTOMY EXCHANGE RIGHT  Result Date: 06/16/2022 INDICATION: 77 year old with history of paraplegia and obstructive urinary calculi. Patient initially had a right nephrostomy tube placed on 12/24/2021 and subsequently had exchanges and a even new placement due to catheter dislodgement. A nephroureteral catheter was placed on 03/25/2022. The nephrostomy tube has been capped. Patient presents for routine exchange. EXAM: UNSUCCESSFUL EXCHANGE OF RIGHT NEPHROURETERAL CATHETER WITH FLUOROSCOPY Physician: Stephan Minister. Anselm Pancoast, MD COMPARISON:  None Available. MEDICATIONS: 1% lidocaine ANESTHESIA/SEDATION: None CONTRAST:  6mL OMNIPAQUE IOHEXOL 300 MG/ML SOLN - administered into the collecting system(s) FLUOROSCOPY: Radiation Exposure Index (as provided by the fluoroscopic device): 71 mGy Kerma  COMPLICATIONS: None immediate. PROCEDURE: The procedure was explained to the patient. The risks and benefits of the procedure were discussed and the patient's questions were addressed. Informed consent was obtained from the patient. Patient was placed prone. The existing catheter and surrounding skin were prepped and draped in sterile fashion. Maximal barrier sterile technique was utilized including caps, mask, sterile gowns, sterile gloves, sterile drape, hand hygiene and skin antiseptic. Contrast was injected through the right nephroureteral catheter. Contrast filled the dilated right renal collecting system and the proximal right ureter. Catheter suture was already broken. The catheter was cut and attempted to remove for a Bentson wire. Bentson wire would not advance into the distal aspect of the nephroureteral catheter. Attempted to advance a stiff Glidewire through the catheter but this was also unsuccessful. Catheter was partially retracted and straightened out but a wire would not advance through the catheter. As a result, the entire catheter was removed. Immediately following removal of the old catheter, a 5 [place] Kumpe catheter was advanced through the old tract and tried to cannulate the right renal collecting system. These attempts were unsuccessful. Contrast was even injected along the tract. Tortuous old tube tract was identified but contrast was not draining into the renal collecting system. Catheter was even manipulated under ultrasound guidance but unable to get access to the right renal collecting system. Bandage placed over the old drain site. Fluoroscopic images were taken and saved for this procedure. FINDINGS: Old right nephroureteral catheter was well positioned with proximal aspect in the renal pelvic region. Contrast injection demonstrated moderate to severe dilatation of the right renal calices. Patient has known kidney stones that were poorly characterized on this examination. The distal  aspect of stent was within the region of the urinary bladder but contrast was not draining into the bladder. Massive amount of stool noted in the rectum. Wires could not be advanced to the distal aspect of the catheter. After the old catheter was completely removed, 5 French catheter was unable to cannulate the renal collecting system despite multiple attempts using ultrasound and fluoroscopic guidance. IMPRESSION: 1. Unsuccessful exchange of the right nephroureteral catheter. Unable to advance a wire through the catheter and the entire catheter had to be removed without a wire. Unable to cannulate the right renal collecting system through the old tract. 2. Ultrasound and fluoroscopy demonstrated moderate to severe right hydronephrosis and suspect that the right nephroureteral catheter was not adequately draining. Findings discussed with patient's urologist Dr. Alyson Ingles. Patient will need decompression of the right renal collecting system. As a result, the patient was transferred to the emergency department and plan for a new percutaneous nephrostomy  tube placement on 06/17/2022. Electronically Signed   By: Markus Daft M.D.   On: 06/16/2022 21:14    Labs:  CBC: Recent Labs    03/09/22 1109 03/25/22 1053 06/16/22 2109 06/17/22 0449  WBC 8.5 5.9 10.1 9.1  HGB 10.4* 10.9* 10.4* 10.0*  HCT 34.4* 35.3* 34.5* 32.9*  PLT 275 250 219 222    COAGS: Recent Labs    11/14/21 2053 11/15/21 0352 02/16/22 1350 03/09/22 1109 03/25/22 1053 06/17/22 0449  INR 1.4*   < > 1.1 1.1 1.1 1.2  APTT 48*  --   --   --   --   --    < > = values in this interval not displayed.    BMP: Recent Labs    02/23/22 1358 03/09/22 1109 06/16/22 2109 06/17/22 0449  NA 139 137 135 135  K 3.2* 4.7 3.6 3.8  CL 110 106 102 104  CO2 22 24 24 24   GLUCOSE 100* 100* 125* 117*  BUN 14 19 27* 30*  CALCIUM 8.7* 8.9 8.9 8.7*  CREATININE 0.33* 0.46 0.47 0.53  GFRNONAA >60 >60 >60 >60    LIVER FUNCTION TESTS: Recent  Labs    11/15/21 0352 11/18/21 0310 12/22/21 1342 03/09/22 1109  BILITOT 0.7 0.6 1.0 0.5  AST 50* 46* 29 28  ALT 32 40 21 18  ALKPHOS 130* 101 146* 81  PROT 5.9* 5.2* 6.8 6.5  ALBUMIN 2.3* 2.0* 2.2* 3.1*    Assessment and Plan: Patient known to IR service from spinal arteriogram in 2008, L2-3 disc space aspiration in 2016, right nephrostomy placement on 12/24/2021 for pyonephrosis/nephrolithiasis, colostomy exchange on 02/16/2022, replacement of right nephrostomy on 03/10/2022 secondary to dislodgment, right nephroureteral catheter insertion on 03/25/2022 with capping of drain.  She presented for routine nephroureteral catheter exchange yesterday however we were unable to advance a wire through the catheter and the entire catheter had to be removed without a wire.  We were unable to cannulate the right renal collecting system through the old tract.  Ultrasound demonstrated moderate to severe right hydronephrosis.  Case reviewed with patient's urologist Dr. Alyson Ingles.  Plans now underway for placement of new right percutaneous nephrostomy.  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  She is currently afebrile, WBC normal, hemoglobin stable at 10, platelets normal, creatinine normal, PT/INR normal.  Currently on IV Zosyn. Risks and benefits of right PCN placement was discussed with the patient including, but not limited to, infection, bleeding, significant bleeding causing loss or decrease in renal function or damage to adjacent structures.   All of the patient's questions were answered, patient is agreeable to proceed.  Consent signed and in chart. Procedure scheduled for today.      Electronically Signed: D. Rowe Robert, PA-C 06/17/2022, 10:44 AM   I spent a total of 20 minutes at the the  patient's bedside AND on the patient's hospital floor or unit, greater than 50% of which was counseling/coordinating care for right percutaneous nephrostomy    Patient ID: Gala Romney, female   DOB: December 06, 1945, 77 y.o.   MRN: [id number]

## 2022-06-17 NOTE — Procedures (Signed)
Interventional Radiology Procedure Note  Procedure: Right nephroureteral catheter placement  Indication: Right hydronephrosis due to calculi  Findings: Please refer to procedural dictation for full description.  Complications: None  EBL: < 10 mL  Miachel Roux, MD [phone]

## 2022-06-17 NOTE — Progress Notes (Signed)
[facility]                                                                              [name], is a 77 y.o. female, DOB - 1945-11-20, [id number] Admit date - 06/16/2022    Outpatient Primary MD for the patient is [place], [place]  LOS - 0  days  Chief Complaint  Patient presents with   Post-op Problem       Brief summary   Patient is a 77 year old female, paraplegic, bedbound, COPD, DM type II, bipolar disorder, neurogenic bladder, hypothyroidism, nephrolithiasis with moderate to severe hydronephrosis, has right nephroureteral catheter. She has 2 right ureteral calculus and a large right UPJ calculus.  Her nephrostomy tubes are changed intermittently, last changed 03/25/2022.  Patient presented to IR for nephrostomy tube change.  IR had a failed exchange of right nephro ureteral catheter, old catheter was removed.  Recommended to completely new right nephrostomy tube placement, IV antibiotics and recommended patient to ED.  Patient's urologist, Dr. Alyson Ingles was consulted.   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   Weight as of this encounter: 97.1 kg.  Code Status: Full code DVT Prophylaxis:  enoxaparin (LOVENOX) injection 40 mg Start: 06/17/22 1700   Level of Care: Level of care: Med-Surg Family Communication: Updated patient Disposition Plan:      Remains inpatient appropriate: Will return to SNF once cleared by IR and urology   Procedures:    Consultants:   Intervention radiology Urology  Antimicrobials:   Anti-infectives (From admission, onward)    Start     Dose/Rate Route Frequency Ordered Stop   06/17/22 0700  piperacillin-tazobactam (ZOSYN) IVPB 3.375 g        3.375 g 100 mL/hr over 30 Minutes Intravenous 30 min pre-op 06/16/22 1814     06/17/22 0015  piperacillin-tazobactam (ZOSYN) IVPB 3.375 g        3.375 g 12.5 mL/hr over 240 Minutes Intravenous Every 8 hours 06/17/22 0014            Medications  atorvastatin  10 mg Oral QPM   Chlorhexidine Gluconate Cloth  6 each Topical Daily   enoxaparin (LOVENOX) injection  40 mg Subcutaneous Q24H  levothyroxine  150 mcg Oral Q0600   midodrine  5 mg Oral TID WC   mirabegron ER  25 mg Oral Daily   tamsulosin  0.4 mg Oral QPC supper   topiramate  50 mg Oral BID      Subjective:   [name] was seen and examined today.  Complaining of headache otherwise no acute issues, no fevers or chills, chest pain or shortness of breath, no nausea or vomiting, fevers.   Objective:   Vitals:   06/16/22 2015 06/17/22 0052 06/17/22 0113 06/17/22 0550  BP: 105/83 109/68  128/68  Pulse: 96 89  91  Resp: 17 18  18   Temp:  99.6 F (37.6 C)  98.6 F (37 C)  TempSrc:  Oral  Oral  SpO2: 96% 96%  100%  Weight:   97.1 kg   Height:   5\' 7"  (1.702 m)     Intake/Output Summary (Last 24 hours) at 06/17/2022 1046 Last data filed at 06/17/2022 0550 Gross per 24 hour  Intake 23.96 ml   Output 300 ml  Net -276.04 ml     Wt Readings from Last 3 Encounters:  06/17/22 97.1 kg  03/25/22 104.3 kg  03/09/22 107 kg     Exam General: Alert and oriented x 3, NAD Cardiovascular: S1 S2 auscultated,  RRR Respiratory: Clear to auscultation bilaterally Gastrointestinal: Soft, nontender, nondistended, + bowel sounds Ext: + pedal edema bilaterally Neuro: paraplegic, lower extremity strength 0/5 bilaterally Skin: Stage III chronic pressure injury to right upper buttocks Psych: Normal affect     Data Reviewed:  I have personally reviewed following labs    CBC Lab Results  Component Value Date   WBC 9.1 06/17/2022   RBC 3.64 (L) 06/17/2022   HGB 10.0 (L) 06/17/2022   HCT 32.9 (L) 06/17/2022   MCV 90.4 06/17/2022   MCH 27.5 06/17/2022   PLT 222 06/17/2022   MCHC 30.4 06/17/2022   RDW 15.5 06/17/2022   LYMPHSABS 2.4 06/17/2022   MONOABS 0.7 06/17/2022   EOSABS 0.3 06/17/2022   BASOSABS 0.0 [id number]     Last metabolic panel Lab Results  Component Value Date   NA 135 06/17/2022   K 3.8 06/17/2022   CL 104 06/17/2022   CO2 24 06/17/2022   BUN 30 (H) 06/17/2022   CREATININE 0.53 06/17/2022   GLUCOSE 117 (H) 06/17/2022   GFRNONAA >60 06/17/2022   GFRAA >60 04/09/2017   CALCIUM 8.7 (L) 06/17/2022   PHOS 2.5 12/28/2021   PROT 6.5 03/09/2022   ALBUMIN 3.1 (L) 03/09/2022   BILITOT 0.5 03/09/2022   ALKPHOS 81 03/09/2022   AST 28 03/09/2022   ALT 18 03/09/2022   ANIONGAP 7 06/17/2022    CBG (last 3)  No results for input(s): "GLUCAP" in the last 72 hours.    Coagulation Profile: Recent Labs  Lab 06/17/22 0449  INR 1.2     Radiology Studies: I have personally reviewed the imaging studies  IR NEPHROSTOMY EXCHANGE RIGHT  Result Date: 06/16/2022 INDICATION: 77 year old with history of paraplegia and obstructive urinary calculi. Patient initially had a right nephrostomy tube placed on 12/24/2021 and subsequently had exchanges and a even new  placement due to catheter dislodgement. A nephroureteral catheter was placed on 03/25/2022. The nephrostomy tube has been capped. Patient presents for routine exchange. EXAM: UNSUCCESSFUL EXCHANGE OF RIGHT NEPHROURETERAL CATHETER WITH FLUOROSCOPY Physician: Stephan Minister. Anselm Pancoast, MD COMPARISON:  None Available. MEDICATIONS: 1% lidocaine ANESTHESIA/SEDATION: None CONTRAST:  54mL OMNIPAQUE IOHEXOL 300 MG/ML SOLN - administered  into the collecting system(s) FLUOROSCOPY: Radiation Exposure Index (as provided by the fluoroscopic device): 71 mGy Kerma COMPLICATIONS: None immediate. PROCEDURE: The procedure was explained to the patient. The risks and benefits of the procedure were discussed and the patient's questions were addressed. Informed consent was obtained from the patient. Patient was placed prone. The existing catheter and surrounding skin were prepped and draped in sterile fashion. Maximal barrier sterile technique was utilized including caps, mask, sterile gowns, sterile gloves, sterile drape, hand hygiene and skin antiseptic. Contrast was injected through the right nephroureteral catheter. Contrast filled the dilated right renal collecting system and the proximal right ureter. Catheter suture was already broken. The catheter was cut and attempted to remove for a Bentson wire. Bentson wire would not advance into the distal aspect of the nephroureteral catheter. Attempted to advance a stiff Glidewire through the catheter but this was also unsuccessful. Catheter was partially retracted and straightened out but a wire would not advance through the catheter. As a result, the entire catheter was removed. Immediately following removal of the old catheter, a 5 [place] Kumpe catheter was advanced through the old tract and tried to cannulate the right renal collecting system. These attempts were unsuccessful. Contrast was even injected along the tract. Tortuous old tube tract was identified but contrast was not draining into the  renal collecting system. Catheter was even manipulated under ultrasound guidance but unable to get access to the right renal collecting system. Bandage placed over the old drain site. Fluoroscopic images were taken and saved for this procedure. FINDINGS: Old right nephroureteral catheter was well positioned with proximal aspect in the renal pelvic region. Contrast injection demonstrated moderate to severe dilatation of the right renal calices. Patient has known kidney stones that were poorly characterized on this examination. The distal aspect of stent was within the region of the urinary bladder but contrast was not draining into the bladder. Massive amount of stool noted in the rectum. Wires could not be advanced to the distal aspect of the catheter. After the old catheter was completely removed, 5 French catheter was unable to cannulate the renal collecting system despite multiple attempts using ultrasound and fluoroscopic guidance. IMPRESSION: 1. Unsuccessful exchange of the right nephroureteral catheter. Unable to advance a wire through the catheter and the entire catheter had to be removed without a wire. Unable to cannulate the right renal collecting system through the old tract. 2. Ultrasound and fluoroscopy demonstrated moderate to severe right hydronephrosis and suspect that the right nephroureteral catheter was not adequately draining. Findings discussed with patient's urologist Dr. Alyson Ingles. Patient will need decompression of the right renal collecting system. As a result, the patient was transferred to the emergency department and plan for a new percutaneous nephrostomy tube placement on 06/17/2022. Electronically Signed   By: Markus Daft M.D.   On: 06/16/2022 21:14       Adhira Jamil M.D. [facility] 06/17/2022, 10:46 AM  Available via Epic secure chat 7am-7pm After 7 pm, please refer to night coverage provider listed on amion.

## 2022-06-17 NOTE — Plan of Care (Signed)
  Problem: Education: Goal: Knowledge of General Education information will improve Description: Including pain rating scale, medication(s)/side effects and non-pharmacologic comfort measures Outcome: Progressing   Problem: Elimination: Goal: Will not experience complications related to bowel motility Outcome: Progressing Goal: Will not experience complications related to urinary retention Outcome: Progressing   

## 2022-06-17 NOTE — Progress Notes (Signed)
MEDICATION-RELATED CONSULT NOTE   IR Procedure Consult - Anticoagulant/Antiplatelet PTA/Inpatient Med List Review by Pharmacist    Procedure:  Right nephroureteral catheter placement     Completed: 3.22.24 13:28  Post-Procedural bleeding risk per IR MD assessment:  standard  Antithrombotic medications on inpatient or PTA profile prior to procedure:   LMWH 40 qday ordered to start 3/22 at 1700     Recommended restart time per IR Post-Procedure Guidelines:  Day + 1 (Next AM)    Plan:    LMWH 40 qday to start 3/23 at 10 am   Eudelia Bunch, Pharm.D Use secure chat for questions 06/17/2022 1:45 PM

## 2022-06-18 DIAGNOSIS — N99528 Other complication of other external stoma of urinary tract: Secondary | ICD-10-CM | POA: Diagnosis not present

## 2022-06-18 DIAGNOSIS — N201 Calculus of ureter: Secondary | ICD-10-CM | POA: Diagnosis not present

## 2022-06-18 DIAGNOSIS — T83092A Other mechanical complication of nephrostomy catheter, initial encounter: Secondary | ICD-10-CM | POA: Diagnosis not present

## 2022-06-18 DIAGNOSIS — N200 Calculus of kidney: Secondary | ICD-10-CM | POA: Diagnosis not present

## 2022-06-18 MED ORDER — AMOXICILLIN-POT CLAVULANATE 875-125 MG PO TABS: 1.0000 | ORAL_TABLET | Freq: Two times a day (BID) | ORAL | Status: AC

## 2022-06-18 NOTE — Discharge Summary (Signed)
Physician Discharge Summary   Patient: [name] MRN: [id number] DOB: 07-Dec-1945  Admit date:     06/16/2022  Discharge date: 06/18/22  Discharge Physician: Estill Cotta, MD    PCP: Nanine Means   Recommendations at discharge:   Continue Augmentin 875-125 mg 1 tab p.o. twice daily for 5 days Needs to follow-up with Dr. Alyson Ingles ([facility] ) to undergo treatment for urolithiasis in next 2 weeks Right new nephrostomy tube placed, IR to follow outpatient, office will contact  Discharge Diagnoses:     Nephrolithiasis with failed exchange of right nephroureteral catheter, hydronephrosis s/p new nephrostomy tube placed on 3/22   Essential hypertension   Hypertension   Diabetes mellitus without complication ([place])   Hypothyroidism   Decubitus ulcer of sacral region, unstageable ([place])   Chronic indwelling Foley catheter   Neurogenic bladder   Hospital Course:  Patient is a 77 year old female, paraplegic, bedbound, COPD, DM type II, bipolar disorder, neurogenic bladder, hypothyroidism, nephrolithiasis with moderate to severe hydronephrosis, has right nephroureteral catheter. She has 2 right ureteral calculus and a large right UPJ calculus.  Her nephrostomy tubes are changed intermittently, last changed 03/25/2022.  Patient presented to IR for nephrostomy tube change.  IR had a failed exchange of right nephro ureteral catheter, old catheter was removed.  Recommended to completely new right nephrostomy tube placement, IV antibiotics and recommended patient to ED.  Patient's urologist, Dr. Alyson Ingles was consulted.   Assessment and Plan:   Nephrolithiasis with failed exchange of right nephroureteral catheter, hydronephrosis -Patient had presented to IR for nephrostomy tube change however failed the exchange of the nephroureteral catheter  -Patient was sent to the ER, placed on n.p.o. status and new right nephrostomy tube was placed on 06/17/2022.   -She was placed on IV Zosyn,  transition to oral Augmentin for 5 days  -Patient was followed by urology after procedure, cleared for discharge from urology perspective.  Outpatient follow-up with Dr. Alyson Ingles to undergo treatment of urolithiasis.        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   Weight as of this encounter: 97.1 kg.     {Tip this will not be part of the note when signed Body mass index is 33.53 kg/m. , ,    Pain control - [facility] Controlled Substance Reporting System database was reviewed. and patient was instructed, not to drive, operate heavy machinery, perform activities at heights, swimming or participation in water activities or provide baby-sitting services while on Pain, Sleep and Anxiety Medications; until their outpatient Physician has advised to do so again. Also recommended to not to take more than prescribed Pain, Sleep and Anxiety Medications.  Consultants: IR, urology Procedures performed: Right nephroureteral catheter placement on 3/22 Disposition: Skilled nursing facility Diet recommendation:  Discharge Diet Orders (From admission, onward)     Start     Ordered  06/18/22 0000  Diet - low sodium heart healthy        06/18/22 0746            DISCHARGE MEDICATION: Allergies as of 06/18/2022       Reactions   Codeine Nausea Only, Other  (See Comments)   "Allergic," per facility's paperwork   Invanz [ertapenem] Other (See Comments)   "Allergic," per facility's paperwork   Propranolol Hcl Other (See Comments)   "Allergic," per facility's paperwork   Rocephin [ceftriaxone] Hives, Other (See Comments)   "Allergic," per facility's paperwork   Vancomycin Hives, Other (See Comments)   "Allergic," per facility's paperwork        Medication List     STOP taking these medications    carvedilol 6.25 MG tablet Commonly known as: COREG   furosemide 40 MG tablet Commonly known as: LASIX   lisinopril 2.5 MG tablet Commonly known as: ZESTRIL   potassium chloride 10 MEQ tablet Commonly known as: KLOR-CON       TAKE these medications    acetaminophen 325 MG tablet Commonly known as: TYLENOL Take 1 tablet (325 mg total) by mouth every 6 (six) hours as needed for mild pain, moderate pain, fever or headache. *May take one every 6 hours as needed for pain What changed:  how much to take reasons to take this additional instructions   Acidophilus Caps capsule Take 1 capsule by mouth 2 (two) times daily.   amoxicillin-clavulanate 875-125 MG tablet Commonly known as: AUGMENTIN Take 1 tablet by mouth 2 (two) times daily for 5 days.   Arginaid Pack Take 1 packet by mouth daily.   Artificial Tears 1.4 % ophthalmic solution Generic drug: polyvinyl alcohol Place 1 drop into both eyes every 12 (twelve) hours as needed for dry eyes.   ascorbic acid 500 MG tablet Commonly known as: VITAMIN C Take 500 mg by mouth 2 (two) times daily.   atorvastatin 10 MG tablet Commonly known as: LIPITOR Take 10 mg by mouth every evening.   azelastine 0.1 % nasal spray Commonly known as: ASTELIN Place 2 sprays into both nostrils 2 (two) times daily.   b complex vitamins tablet Take 1 tablet by mouth daily.   CALCIUM CARBONATE-VITAMIN D3 PO Take 1 tablet by mouth in the morning and at bedtime.   Calmoseptine 0.44-20.6 %  Oint Generic drug: Menthol-Zinc Oxide Apply 1 application  topically every 8 (eight) hours as needed (for moisture-associated skin damage- right rear thigh/buttocks). Apply every 8 hours as needed for for moisture-associated skin damage (right rear thigh/buttocks) and at bedtime (SCHEDULED)   CERTAGEN PO Take 1 tablet by mouth daily with breakfast.   Cranberry 500 MG Tabs Take 500 mg by mouth daily.   dextromethorphan-guaiFENesin 30-600 MG 12hr tablet Commonly known as: MUCINEX DM Take 1 tablet by mouth every 12 (twelve) hours as needed for cough.   docusate sodium 100 MG capsule Commonly known as: COLACE Take 200 mg by mouth at bedtime.   ezetimibe 10 MG tablet Commonly known as: ZETIA Take 10 mg by mouth at bedtime.   famotidine 20 MG tablet Commonly known as: PEPCID Take 20 mg by mouth 2 (two) times daily.   ferrous sulfate 325 (65 FE) MG EC tablet Take 325 mg by mouth 3 (three) times daily with meals.   fluticasone 50 MCG/ACT nasal spray Commonly known as: FLONASE Place 2 sprays into both nostrils in the morning.   gabapentin 300 MG capsule Commonly known as: NEURONTIN Take 300 mg by  mouth 3 (three) times daily.   ipratropium-albuterol 0.5-2.5 (3) MG/3ML Soln Commonly known as: DUONEB Take 3 mLs by nebulization every 6 (six) hours as needed (shortness of breath).   loratadine 10 MG tablet Commonly known as: CLARITIN Take 10 mg by mouth daily as needed (for seasonal allergies).   Mag-Oxide 200 MG Tabs Generic drug: Magnesium Oxide -Mg Supplement Take 400 mg by mouth in the morning.   midodrine 5 MG tablet Commonly known as: PROAMATINE Take 1 tablet (5 mg total) by mouth 3 (three) times daily with meals. What changed: when to take this   mirabegron ER 25 MG Tb24 tablet Commonly known as: MYRBETRIQ Take 25 mg by mouth in the morning.   Pataday 0.1 % ophthalmic solution Generic drug: olopatadine Place 1 drop into both eyes every 6 (six) hours as needed for  allergies.   polyethylene glycol 17 g packet Commonly known as: MIRALAX / GLYCOLAX Take 17 g by mouth daily as needed for moderate constipation. What changed: reasons to take this   rizatriptan 10 MG tablet Commonly known as: MAXALT Take 10 mg by mouth daily as needed for migraine.   sodium hypochlorite 0.125 % Soln Commonly known as: DAKIN'S 1/4 STRENGTH Irrigate with 1 Application as directed See admin instructions. Apply to wound bed topically every day shift for healing   Synthroid 200 MCG tablet Generic drug: levothyroxine Take 200 mcg by mouth daily before breakfast.   tamsulosin 0.4 MG Caps capsule Commonly known as: FLOMAX Take 1 capsule (0.4 mg total) by mouth daily after supper. What changed: when to take this   topiramate 50 MG tablet Commonly known as: TOPAMAX Take 50 mg by mouth 2 (two) times daily.   Vitamin D (Ergocalciferol) 1.25 MG (50000 UNIT) Caps capsule Commonly known as: DRISDOL Take 50,000 Units by mouth every Saturday.               Discharge Care Instructions  (From admission, onward)           Start     Ordered   06/18/22 0000  Discharge wound care:       Comments: Wound care  Daily      Comments: Foam dressing to buttocks wounds and posterior thigh wounds, change Q 3 days or PRN soiling   06/18/22 0746            Follow-up Information     Mir, Paula Libra, MD Follow up.   Specialties: Interventional Radiology, Diagnostic Radiology, Radiology Why: Fonnie Mu will contact you with date and time of follow-up appointment. Contact information: [address] [place] [address] [phone]         [place]. Schedule an appointment as soon as possible for a visit in 2 week(s).   Specialty: [place] Why: for hospital follow-up Contact information: [place] [address] [phone]         Cleon Gustin, MD. Schedule an appointment as soon  as possible for a visit in 2 week(s).   Specialty: Urology Why: for hospital follow-up Contact information: [place] [address] [place] [address] [phone]                Discharge Exam: Danley Danker Weights   06/17/22 0113  Weight: 97.1 kg   S: No acute complaints, tolerated procedure for the right nephrostomy tube placement yesterday.  No acute issues overnight.  Cleared from neurology perspective to discharge back to SNF.  No fevers, abdominal pain or any leukocytosis.  BP 121/65 (BP Location: Left Arm)   Pulse 79   Temp 98.4 F (36.9 C) (Oral)   Resp 18   Ht 5\' 7"  (1.702 m)   Wt 97.1 kg   SpO2 98%   BMI 33.53 kg/m   Physical Exam General: Alert and oriented x 3, NAD Cardiovascular: S1 S2 clear, RRR.  Respiratory: CTAB, no wheezing, rales or rhonchi Gastrointestinal: Soft, nontender, nondistended, NBS Ext: + pedal edema bilaterally Neuro: paraplegia with lower extremity strength 0/5 bilaterally Psych: Normal affect     Condition at discharge: fair  The results of significant diagnostics from this hospitalization (including imaging, microbiology, ancillary and laboratory) are listed below for reference.   Imaging Studies: IR NEPHROSTOMY PLACEMENT RIGHT  Result Date: 06/17/2022 INDICATION: 77 year old woman with history of paraplegia and obstructive urinary calculi underwent initial right nephrostomy drain placement on 12/24/2021. Due to history of frequent drain dislodgement, it was converted to a nephroureteral catheter on 03/25/2022. She returned to IR on 06/16/2022 for exchange, however the drain was clogged and had to be removed. She returns today for new insertion. EXAM: Ultrasound and fluoroscopy guided right percutaneous nephroureteral catheter placement. COMPARISON:  06/16/2022 03/25/2022 MEDICATIONS: Zosyn 3.375 IV; The antibiotic was administered in an appropriate time frame prior to skin puncture. ANESTHESIA/SEDATION: None CONTRAST:  20 mL of  Omnipaque 300-administered into the collecting system(s) FLUOROSCOPY TIME:  Radiation Exposure Index (as provided by the fluoroscopic device): 19 mGy Kerma COMPLICATIONS: None immediate. PROCEDURE: Informed written consent was obtained from the patient after a thorough discussion of the procedural risks, benefits and alternatives. All questions were addressed. Maximal Sterile Barrier Technique was utilized including caps, mask, sterile gowns, sterile gloves, sterile drape, hand hygiene and skin antiseptic. A timeout was performed prior to the initiation of the procedure. Patient positioned prone on the procedure table. 21 gauge needle utilized to access the lower pole of the right kidney utilizing continuous ultrasound guidance. Contrast administered through the 21 gauge needle under fluoroscopy confirmed appropriate access of the renal collecting system. Large upper pole calculus was again noted. 21 gauge needle exchanged for transitional dilator set over 0.018 inch guidewire. Transitional set exchanged for 0.035 inch stiff glidewire. Stiff glidewire advanced to the level of the bladder utilizing Kumpe catheter and fluoroscopic guidance. Contrast administered through the Kumpe catheter confirmed intraluminal positioning within bladder lumen. Kumpe catheter was removed and 26 cm 10 French nephroureteral drain was inserted over the stiff glidewire. 50 mL of purulent material was aspirated. Samples were sent for Gram stain and culture. The distal pigtail was formed within the bladder lumen and the proximal pigtail was formed in the renal pelvis, confirmed by administering contrast under fluoroscopy. Drain was flushed and attached to bag. IMPRESSION: 1. Successful placement of right nephroureteral catheter (26 cm 10 cm. 2. 50 mL of purulent material was aspirated from the right renal collecting system. Samples were sent for Gram stain and culture. PLAN: 1. Maintain drain to bag for at least 1 week to allow for  infection to clear. 2. May consider capping drain after 1 week, after urine has cleared. 3. Drain should be flushed daily with 10 mL of saline. 4. Patient should return in 8 weeks for routine exchange. Electronically Signed   By: Miachel Roux M.D.   On: 06/17/2022 14:59   IR NEPHROSTOMY EXCHANGE RIGHT  Result Date: 06/16/2022 INDICATION: 77 year old with history of paraplegia and obstructive urinary calculi. Patient initially had a right nephrostomy tube placed on  12/24/2021 and subsequently had exchanges and a even new placement due to catheter dislodgement. A nephroureteral catheter was placed on 03/25/2022. The nephrostomy tube has been capped. Patient presents for routine exchange. EXAM: UNSUCCESSFUL EXCHANGE OF RIGHT NEPHROURETERAL CATHETER WITH FLUOROSCOPY Physician: Stephan Minister. Anselm Pancoast, MD COMPARISON:  None Available. MEDICATIONS: 1% lidocaine ANESTHESIA/SEDATION: None CONTRAST:  67mL OMNIPAQUE IOHEXOL 300 MG/ML SOLN - administered into the collecting system(s) FLUOROSCOPY: Radiation Exposure Index (as provided by the fluoroscopic device): 71 mGy Kerma COMPLICATIONS: None immediate. PROCEDURE: The procedure was explained to the patient. The risks and benefits of the procedure were discussed and the patient's questions were addressed. Informed consent was obtained from the patient. Patient was placed prone. The existing catheter and surrounding skin were prepped and draped in sterile fashion. Maximal barrier sterile technique was utilized including caps, mask, sterile gowns, sterile gloves, sterile drape, hand hygiene and skin antiseptic. Contrast was injected through the right nephroureteral catheter. Contrast filled the dilated right renal collecting system and the proximal right ureter. Catheter suture was already broken. The catheter was cut and attempted to remove for a Bentson wire. Bentson wire would not advance into the distal aspect of the nephroureteral catheter. Attempted to advance a stiff Glidewire  through the catheter but this was also unsuccessful. Catheter was partially retracted and straightened out but a wire would not advance through the catheter. As a result, the entire catheter was removed. Immediately following removal of the old catheter, a 5 [place] Kumpe catheter was advanced through the old tract and tried to cannulate the right renal collecting system. These attempts were unsuccessful. Contrast was even injected along the tract. Tortuous old tube tract was identified but contrast was not draining into the renal collecting system. Catheter was even manipulated under ultrasound guidance but unable to get access to the right renal collecting system. Bandage placed over the old drain site. Fluoroscopic images were taken and saved for this procedure. FINDINGS: Old right nephroureteral catheter was well positioned with proximal aspect in the renal pelvic region. Contrast injection demonstrated moderate to severe dilatation of the right renal calices. Patient has known kidney stones that were poorly characterized on this examination. The distal aspect of stent was within the region of the urinary bladder but contrast was not draining into the bladder. Massive amount of stool noted in the rectum. Wires could not be advanced to the distal aspect of the catheter. After the old catheter was completely removed, 5 French catheter was unable to cannulate the renal collecting system despite multiple attempts using ultrasound and fluoroscopic guidance. IMPRESSION: 1. Unsuccessful exchange of the right nephroureteral catheter. Unable to advance a wire through the catheter and the entire catheter had to be removed without a wire. Unable to cannulate the right renal collecting system through the old tract. 2. Ultrasound and fluoroscopy demonstrated moderate to severe right hydronephrosis and suspect that the right nephroureteral catheter was not adequately draining. Findings discussed with patient's urologist Dr.  Alyson Ingles. Patient will need decompression of the right renal collecting system. As a result, the patient was transferred to the emergency department and plan for a new percutaneous nephrostomy tube placement on 06/17/2022. Electronically Signed   By: Markus Daft M.D.   On: 06/16/2022 21:14    Microbiology: Results for orders placed or performed during the hospital encounter of 06/16/22  MRSA Next Gen by PCR, Nasal     Status: Abnormal   Collection Time: 06/17/22  1:14 AM   Specimen: Nasal Mucosa; Nasal Swab  Result Value Ref  Range Status   MRSA by PCR Next Gen DETECTED (A) NOT DETECTED Final    Comment: (NOTE) The GeneXpert MRSA Assay (FDA approved for NASAL specimens only), is one component of a comprehensive MRSA colonization surveillance program. It is not intended to diagnose MRSA infection nor to guide or monitor treatment for MRSA infections. Test performance is not FDA approved in patients less than 5 years old. Performed at [facility], [place] [address] [place], [place] [address]   Aerobic/Anaerobic Culture w Gram Stain (surgical/deep wound)     Status: None (Preliminary result)   Collection Time: 06/17/22  1:28 PM   Specimen: Wound  Result Value Ref Range Status   Specimen Description   Final    WOUND ANAEROBIC URINE Performed at [facility] [address] [place], [place] [address]    Special Requests   Final    NONE Performed at [facility], [place] [address], [place], [place] [address]    Gram Stain   Final    ABUNDANT WBC PRESENT, PREDOMINANTLY PMN ABUNDANT GRAM POSITIVE COCCI IN PAIRS IN CHAINS Performed at [facility], [place] [address] [place], [place] [address]    Culture PENDING  Incomplete   Report Status PENDING  Incomplete    Labs: CBC: Recent Labs  Lab 06/16/22 2109 06/17/22 0449  WBC 10.1 9.1  NEUTROABS  --  5.6  HGB 10.4* 10.0*  HCT 34.5* 32.9*  MCV 90.6 90.4  PLT 219 [id number]   Basic  Metabolic Panel: Recent Labs  Lab 06/16/22 2109 06/17/22 0449  NA 135 135  K 3.6 3.8  CL 102 104  CO2 24 24  GLUCOSE 125* 117*  BUN 27* 30*  CREATININE 0.47 0.53  CALCIUM 8.9 8.7*   Liver Function Tests: No results for input(s): "AST", "ALT", "ALKPHOS", "BILITOT", "PROT", "ALBUMIN" in the last 168 hours. CBG: No results for input(s): "GLUCAP" in the last 168 hours.  Discharge time spent: greater than 30 minutes.  Signed: Estill Cotta, MD [facility] 06/18/2022

## 2022-06-18 NOTE — Progress Notes (Signed)
Patient dishcarge instructions given to PTAR. Report called to [place] at [facility]. All questions answered regarding new nephrostomy tube place, medication administration, and follow-up appointments. Patient safely transported on stretcher to elevator with PTAR.

## 2022-06-18 NOTE — TOC Transition Note (Signed)
Transition of Care [facility]) - CM/SW Discharge Note   Patient Details  Name: [name] MRN: [id number] Date of Birth: [birth year]  [facility] CM/SW Contact:  Illene Regulus, LCSW Phone Number: 06/18/2022, 10:22 AM   Clinical Narrative:    Pt to d/c to [facility], [place] 415 call report [phone]. CSW attempted to contact pt's daughter no response left VM. PTAR called no additional needs TOC sign off.          Patient Goals and CMS Choice      Discharge Placement                         Discharge Plan and Services Additional resources added to the After Visit Summary for                                       Social Determinants of Health (SDOH) Interventions SDOH Screenings   Food Insecurity: No Food Insecurity (06/17/2022)  Housing: Low Risk  (06/17/2022)  Transportation Needs: No Transportation Needs (06/17/2022)  Utilities: Not At Risk (06/17/2022)  Tobacco Use: Medium Risk (06/17/2022)     Readmission Risk Interventions    11/15/2021   12:49 PM  Readmission Risk Prevention Plan  Transportation Screening Complete  HRI or [place] Complete  Social Work Consult for [place] Planning/Counseling Complete  Palliative Care Screening Not Applicable  Medication Review [employer]) Complete

## 2022-06-18 NOTE — Progress Notes (Signed)
This RN assumed care of this patient at 2300. No changes in assessment from previous RN's documented assessment.

## 2022-06-23 LAB — AEROBIC/ANAEROBIC CULTURE W GRAM STAIN (SURGICAL/DEEP WOUND)

## 2022-07-18 ENCOUNTER — Other Ambulatory Visit: Payer: Self-pay

## 2022-07-18 ENCOUNTER — Encounter (HOSPITAL_COMMUNITY): Payer: Self-pay

## 2022-07-18 ENCOUNTER — Encounter (HOSPITAL_COMMUNITY)
Admission: RE | Admit: 2022-07-18 | Discharge: 2022-07-18 | Disposition: A | Discharge status: Home / Self Care | Payer: Medicare Other | Source: Ambulatory Visit | Attending: Urology | Admitting: Urology

## 2022-07-18 NOTE — Progress Notes (Signed)
PAT phone call completed with lindsey, SNF nurse of the patient.  Verbalized understanding regarding instructions for procedure and arrival time.

## 2022-07-19 NOTE — Progress Notes (Signed)
Spoke with [name] at [facility]. She stated that pt can only be transported using EMS and non emergent EMS only starts at 8 am.  [name] verbalized understanding of where to tell EMS to take pt to arrive for surgery. RN will communicate with staff regarding later arrival time

## 2022-07-20 MED ORDER — GENTAMICIN SULFATE 40 MG/ML IJ SOLN
5.0000 mg/kg | INTRAVENOUS | Status: DC
  Filled 2022-07-20: qty 12.25

## 2022-07-21 ENCOUNTER — Encounter (HOSPITAL_COMMUNITY): Payer: Self-pay | Admitting: Urology

## 2022-07-21 ENCOUNTER — Ambulatory Visit (HOSPITAL_BASED_OUTPATIENT_CLINIC_OR_DEPARTMENT_OTHER): Payer: Medicare Other | Admitting: Anesthesiology

## 2022-07-21 ENCOUNTER — Ambulatory Visit (HOSPITAL_COMMUNITY): Payer: Medicare Other | Admitting: Anesthesiology

## 2022-07-21 ENCOUNTER — Ambulatory Visit (HOSPITAL_COMMUNITY)
Admission: RE | Admit: 2022-07-21 | Discharge: 2022-07-21 | Disposition: A | Discharge status: Home / Self Care | Payer: Medicare Other | Attending: Urology | Admitting: Urology

## 2022-07-21 ENCOUNTER — Encounter (HOSPITAL_COMMUNITY)
Admission: RE | Disposition: A | Discharge status: Home / Self Care | Payer: Self-pay | Source: Home / Self Care | Attending: Urology

## 2022-07-21 ENCOUNTER — Ambulatory Visit (HOSPITAL_COMMUNITY): Payer: Medicare Other

## 2022-07-21 DIAGNOSIS — E119 Type 2 diabetes mellitus without complications: Secondary | ICD-10-CM | POA: Insufficient documentation

## 2022-07-21 DIAGNOSIS — J449 Chronic obstructive pulmonary disease, unspecified: Secondary | ICD-10-CM

## 2022-07-21 DIAGNOSIS — N202 Calculus of kidney with calculus of ureter: Secondary | ICD-10-CM | POA: Diagnosis not present

## 2022-07-21 DIAGNOSIS — I10 Essential (primary) hypertension: Secondary | ICD-10-CM | POA: Diagnosis not present

## 2022-07-21 DIAGNOSIS — N200 Calculus of kidney: Secondary | ICD-10-CM

## 2022-07-21 DIAGNOSIS — Z87891 Personal history of nicotine dependence: Secondary | ICD-10-CM | POA: Diagnosis not present

## 2022-07-21 DIAGNOSIS — G8220 Paraplegia, unspecified: Secondary | ICD-10-CM | POA: Diagnosis not present

## 2022-07-21 DIAGNOSIS — E039 Hypothyroidism, unspecified: Secondary | ICD-10-CM | POA: Diagnosis not present

## 2022-07-21 DIAGNOSIS — N201 Calculus of ureter: Secondary | ICD-10-CM

## 2022-07-21 HISTORY — PX: CYSTOSCOPY: SHX5120

## 2022-07-21 LAB — GLUCOSE, CAPILLARY: Glucose-Capillary: 106 mg/dL — ABNORMAL HIGH (ref 70–99)

## 2022-07-21 SURGERY — CYSTOSCOPY
Anesthesia: General | Site: Renal | Laterality: Right

## 2022-07-21 MED ORDER — GLYCOPYRROLATE PF 0.2 MG/ML IJ SOSY
PREFILLED_SYRINGE | INTRAMUSCULAR | Status: AC
  Filled 2022-07-21: qty 1

## 2022-07-21 MED ORDER — PROPOFOL 10 MG/ML IV BOLUS
INTRAVENOUS | Status: DC | PRN
  Administered 2022-07-21: 150 mg via INTRAVENOUS
  Administered 2022-07-21: 50 mg via INTRAVENOUS

## 2022-07-21 MED ORDER — EPHEDRINE 5 MG/ML INJ
INTRAVENOUS | Status: AC
  Filled 2022-07-21: qty 5

## 2022-07-21 MED ORDER — LIDOCAINE HCL (CARDIAC) PF 100 MG/5ML IV SOSY
PREFILLED_SYRINGE | INTRAVENOUS | Status: DC | PRN
  Administered 2022-07-21: 60 mg via INTRAVENOUS

## 2022-07-21 MED ORDER — WATER FOR IRRIGATION, STERILE IR SOLN
Status: DC | PRN
  Administered 2022-07-21: 500 mL

## 2022-07-21 MED ORDER — METOPROLOL TARTRATE 5 MG/5ML IV SOLN
INTRAVENOUS | Status: AC
  Filled 2022-07-21: qty 5

## 2022-07-21 MED ORDER — PHENYLEPHRINE HCL (PRESSORS) 10 MG/ML IV SOLN
INTRAVENOUS | Status: DC | PRN
  Administered 2022-07-21 (×2): 160 ug via INTRAVENOUS
  Administered 2022-07-21: 80 ug via INTRAVENOUS
  Administered 2022-07-21: 160 ug via INTRAVENOUS

## 2022-07-21 MED ORDER — GLYCOPYRROLATE PF 0.2 MG/ML IJ SOSY
PREFILLED_SYRINGE | INTRAMUSCULAR | Status: DC | PRN
  Administered 2022-07-21: .2 mg via INTRAVENOUS

## 2022-07-21 MED ORDER — ONDANSETRON HCL 4 MG/2ML IJ SOLN
INTRAMUSCULAR | Status: AC
  Filled 2022-07-21: qty 2

## 2022-07-21 MED ORDER — PROPOFOL 10 MG/ML IV BOLUS
INTRAVENOUS | Status: AC
  Filled 2022-07-21: qty 20

## 2022-07-21 MED ORDER — CHLORHEXIDINE GLUCONATE 0.12 % MT SOLN
15.0000 mL | Freq: Once | OROMUCOSAL | Status: AC
  Administered 2022-07-21: 15 mL via OROMUCOSAL

## 2022-07-21 MED ORDER — LIDOCAINE HCL (PF) 2 % IJ SOLN
INTRAMUSCULAR | Status: AC
  Filled 2022-07-21: qty 5

## 2022-07-21 MED ORDER — LACTATED RINGERS IV SOLN: INTRAVENOUS | Status: DC

## 2022-07-21 MED ORDER — ONDANSETRON HCL 4 MG/2ML IJ SOLN
INTRAMUSCULAR | Status: DC | PRN
  Administered 2022-07-21: 4 mg via INTRAVENOUS

## 2022-07-21 MED ORDER — EPHEDRINE SULFATE (PRESSORS) 50 MG/ML IJ SOLN
INTRAMUSCULAR | Status: DC | PRN
  Administered 2022-07-21: 10 mg via INTRAVENOUS

## 2022-07-21 MED ORDER — FENTANYL CITRATE (PF) 250 MCG/5ML IJ SOLN
INTRAMUSCULAR | Status: AC
  Filled 2022-07-21: qty 5

## 2022-07-21 MED ORDER — ROCURONIUM BROMIDE 10 MG/ML (PF) SYRINGE
PREFILLED_SYRINGE | INTRAVENOUS | Status: DC | PRN
  Administered 2022-07-21: 80 mg via INTRAVENOUS

## 2022-07-21 MED ORDER — ROCURONIUM BROMIDE 10 MG/ML (PF) SYRINGE
PREFILLED_SYRINGE | INTRAVENOUS | Status: AC
  Filled 2022-07-21: qty 10

## 2022-07-21 MED ORDER — DIATRIZOATE MEGLUMINE 30 % UR SOLN
URETHRAL | Status: AC
  Filled 2022-07-21: qty 100

## 2022-07-21 MED ORDER — FENTANYL CITRATE (PF) 100 MCG/2ML IJ SOLN
INTRAMUSCULAR | Status: DC | PRN
  Administered 2022-07-21: 50 ug via INTRAVENOUS

## 2022-07-21 MED ORDER — SODIUM CHLORIDE 0.9 % IR SOLN
Status: DC | PRN
  Administered 2022-07-21 (×2): 3000 mL

## 2022-07-21 MED ORDER — SUGAMMADEX SODIUM 200 MG/2ML IV SOLN
INTRAVENOUS | Status: DC | PRN
  Administered 2022-07-21 (×4): 100 mg via INTRAVENOUS

## 2022-07-21 MED ORDER — GENTAMICIN SULFATE 40 MG/ML IJ SOLN
5.0000 mg/kg | INTRAVENOUS | Status: AC
  Administered 2022-07-21: 490 mg via INTRAVENOUS
  Filled 2022-07-21: qty 12.25

## 2022-07-21 MED ORDER — PHENYLEPHRINE 80 MCG/ML (10ML) SYRINGE FOR IV PUSH (FOR BLOOD PRESSURE SUPPORT)
PREFILLED_SYRINGE | INTRAVENOUS | Status: AC
  Filled 2022-07-21: qty 10

## 2022-07-21 MED ORDER — DEXMEDETOMIDINE HCL IN NACL 80 MCG/20ML IV SOLN
INTRAVENOUS | Status: DC | PRN
  Administered 2022-07-21: 8 ug via INTRAVENOUS

## 2022-07-21 MED ORDER — ORAL CARE MOUTH RINSE: 15.0000 mL | Freq: Once | OROMUCOSAL | Status: AC

## 2022-07-21 MED ORDER — METOPROLOL TARTRATE 5 MG/5ML IV SOLN
INTRAVENOUS | Status: DC | PRN
  Administered 2022-07-21: 1 mg via INTRAVENOUS

## 2022-07-21 SURGICAL SUPPLY — 27 items
BAG DRAIN URO TABLE W/ADPT NS (BAG) ×3 IMPLANT
BAG DRN 8 ADPR NS SKTRN CSTL (BAG) ×2
BAG DRN URN TUBE DRIP CHMBR (OSTOMY) ×2
BAG HAMPER (MISCELLANEOUS) ×3 IMPLANT
BAG URINE DRAIN TURP 4L (OSTOMY) ×1 IMPLANT
CATH FOLEY 2WAY SLVR  5CC 20FR (CATHETERS) ×2
CATH FOLEY 2WAY SLVR 5CC 20FR (CATHETERS) ×1 IMPLANT
CATH INTERMIT  6FR 70CM (CATHETERS) ×3 IMPLANT
CLOTH BEACON ORANGE TIMEOUT ST (SAFETY) ×3 IMPLANT
DECANTER SPIKE VIAL GLASS SM (MISCELLANEOUS) ×3 IMPLANT
EXTRACTOR STONE NITINOL NGAGE (UROLOGICAL SUPPLIES) IMPLANT
GLOVE BIO SURGEON STRL SZ8 (GLOVE) ×3 IMPLANT
GLOVE BIOGEL PI IND STRL 7.0 (GLOVE) ×6 IMPLANT
GOWN STRL REUS W/TWL LRG LVL3 (GOWN DISPOSABLE) ×3 IMPLANT
GOWN STRL REUS W/TWL XL LVL3 (GOWN DISPOSABLE) ×3 IMPLANT
GUIDEWIRE STR DUAL SENSOR (WIRE) ×3 IMPLANT
GUIDEWIRE STR ZIPWIRE 035X150 (MISCELLANEOUS) ×3 IMPLANT
IV NS IRRIG 3000ML ARTHROMATIC (IV SOLUTION) ×6 IMPLANT
KIT TURNOVER CYSTO (KITS) ×3 IMPLANT
MANIFOLD NEPTUNE II (INSTRUMENTS) ×3 IMPLANT
PACK CYSTO (CUSTOM PROCEDURE TRAY) ×3 IMPLANT
PAD ARMBOARD 7.5X6 YLW CONV (MISCELLANEOUS) ×3 IMPLANT
SHEATH URETERAL 12FRX35CM (MISCELLANEOUS) IMPLANT
SYR 10ML LL (SYRINGE) ×3 IMPLANT
SYR CONTROL 10ML LL (SYRINGE) ×3 IMPLANT
TOWEL OR 17X26 4PK STRL BLUE (TOWEL DISPOSABLE) ×3 IMPLANT
WATER STERILE IRR 500ML POUR (IV SOLUTION) ×3 IMPLANT

## 2022-07-21 NOTE — H&P (Signed)
HPI: [name] is a 77yo here for followup for nephrolithiasis. She has 2 right ureteral calculi and a large right UPJ calculus. She is confined to a stretcher. He nephrostomy tube was last changed 1 month ago. Her right UPJ calculus is 3.7cm.      PMH:     Past Medical History:  Diagnosis Date   Bipolar 1 disorder (HCC)     COPD with asthma     Diabetes mellitus without complication (HCC)     GERD (gastroesophageal reflux disease)     Hypertension     Obesity     Paraplegic spinal paralysis (HCC)     Pyelonephritis     Thyroid disease        Surgical History:      Past Surgical History:  Procedure Laterality Date   ABDOMINAL HYSTERECTOMY       BACK SURGERY       IR NEPHROSTOMY EXCHANGE LEFT   02/16/2022   IR NEPHROSTOMY EXCHANGE RIGHT   03/10/2022   IR NEPHROSTOMY PLACEMENT RIGHT   12/24/2021   IR NEPHROURETERAL CATH PLACE RIGHT   03/25/2022      Home Medications:  Allergies as of 05/27/2022         Reactions    Codeine Nausea Only    Listed on MAR    Invanz [ertapenem] Other (See Comments)    Patient states allergy but is not certain     Propranolol Hcl      Listed on MAR    Rocephin [ceftriaxone] Hives    Vancomycin Hives            Medication List           Accurate as of May 27, 2022 11:39 AM. If you have any questions, ask your nurse or doctor.              acetaminophen 325 MG tablet Commonly known as: TYLENOL Take 1 tablet (325 mg total) by mouth every 6 (six) hours as needed for mild pain, moderate pain, fever or headache. *May take one every 6 hours as needed for pain What changed:  how much to take additional instructions    ACIDOPHILUS PO Take 1 tablet by mouth in the morning and at bedtime.    Arginaid Pack Take 1 packet by mouth daily.    ascorbic acid 500 MG tablet Commonly known as: VITAMIN C Take 500 mg by mouth 2 (two) times daily.    atorvastatin 10 MG tablet Commonly known as: LIPITOR Take 10 mg by mouth every evening.     azelastine 0.1 % nasal spray Commonly known as: ASTELIN Place 2 sprays into both nostrils 2 (two) times daily. Use in each nostril as directed    b complex vitamins tablet Take 1 tablet by mouth daily.    CALCIUM CARBONATE-VITAMIN D3 PO Take 1 tablet by mouth in the morning and at bedtime.    carvedilol 6.25 MG tablet Commonly known as: COREG Take 6.25 mg by mouth 2 (two) times daily. Hold for SBP <100 or HR < 60    CERTAGEN PO Take 1 tablet by mouth daily.    Cranberry 500 MG Tabs Take 1 capsule by mouth daily.    dextromethorphan-guaiFENesin 30-600 MG 12hr tablet Commonly known as: MUCINEX DM Take 1 tablet by mouth every 12 (twelve) hours as needed for cough.    docusate sodium 100 MG capsule Commonly known as: COLACE Take 200 mg by mouth at bedtime.    ezetimibe  10 MG tablet Commonly known as: ZETIA Take 10 mg by mouth at bedtime.    famotidine 20 MG tablet Commonly known as: PEPCID Take 20 mg by mouth 2 (two) times daily.    feeding supplement (PRO-STAT 64) Liqd Take 30 mLs by mouth 3 (three) times daily with meals.    ferrous sulfate 325 (65 FE) MG EC tablet Take 325 mg by mouth 3 (three) times daily with meals.    fluticasone 50 MCG/ACT nasal spray Commonly known as: FLONASE Place 2 sprays into both nostrils daily.    gabapentin 300 MG capsule Commonly known as: NEURONTIN Take 300 mg by mouth every 8 (eight) hours.    ipratropium-albuterol 0.5-2.5 (3) MG/3ML Soln Commonly known as: DUONEB Take 3 mLs by nebulization every 6 (six) hours as needed (shortness of breath).    levothyroxine 150 MCG tablet Commonly known as: SYNTHROID Take 150 mcg by mouth daily.    lisinopril 2.5 MG tablet Commonly known as: ZESTRIL Take 1.25 mg by mouth daily.    midodrine 5 MG tablet Commonly known as: PROAMATINE Take 1 tablet (5 mg total) by mouth 3 (three) times daily with meals. What changed: when to take this    mirabegron ER 25 MG Tb24 tablet Commonly known  as: MYRBETRIQ Take 25 mg by mouth daily.    OcuSoft Eyelid Cleansing Pads Apply 1 Application topically daily.    Pataday 0.1 % ophthalmic solution Generic drug: olopatadine Place 1 drop into both eyes every 6 (six) hours as needed for allergies.    polyethylene glycol 17 g packet Commonly known as: MIRALAX / GLYCOLAX Take 17 g by mouth daily as needed for moderate constipation.    promethazine 12.5 MG suppository Commonly known as: PHENERGAN Place 12.5 mg rectally every 12 (twelve) hours as needed for nausea or vomiting.    rizatriptan 10 MG tablet Commonly known as: MAXALT Take 10 mg by mouth daily as needed for migraine.    tamsulosin 0.4 MG Caps capsule Commonly known as: FLOMAX Take 1 capsule (0.4 mg total) by mouth daily after supper.    topiramate 50 MG tablet Commonly known as: TOPAMAX Take 50 mg by mouth 2 (two) times daily.    Vitamin D (Ergocalciferol) 1.25 MG (50000 UNIT) Caps capsule Commonly known as: DRISDOL Take 50,000 Units by mouth every Saturday.             Allergies:       Allergies  Allergen Reactions   Codeine Nausea Only      Listed on MAR   Invanz [Ertapenem] Other (See Comments)      Patient states allergy but is not certain    Propranolol Hcl        Listed on MAR   Rocephin [Ceftriaxone] Hives   Vancomycin Hives      Family History:      Family History  Adopted: Yes  Problem Relation Age of Onset   Heart disease Mother        Social History:  reports that she has quit smoking. She has never used smokeless tobacco. She reports that she does not currently use alcohol. She reports that she does not use drugs.   ROS: All other review of systems were reviewed and are negative except what is noted above in HPI   Physical Exam: BP 101/70   Pulse 78   Constitutional:  Alert and oriented, No acute distress. HEENT: Friend AT, moist mucus membranes.  Trachea midline, no masses. Cardiovascular: No clubbing, cyanosis,  or  edema. Respiratory: Normal respiratory effort, no increased work of breathing. GI: Abdomen is soft, nontender, nondistended, no abdominal masses GU: No CVA tenderness.  Lymph: No cervical or inguinal lymphadenopathy. Skin: No rashes, bruises or suspicious lesions. Neurologic: Grossly intact, no focal deficits, moving all 4 extremities. Psychiatric: Normal mood and affect.   Laboratory Data: Recent Labs       Lab Results  Component Value Date    WBC 5.9 03/25/2022    HGB 10.9 (L) 03/25/2022    HCT 35.3 (L) 03/25/2022    MCV 90.7 03/25/2022    PLT 250 03/25/2022        Recent Labs       Lab Results  Component Value Date    CREATININE 0.46 03/09/2022        Recent Labs  No results found for: "PSA"     Recent Labs  No results found for: "TESTOSTERONE"     Recent Labs       Lab Results  Component Value Date    HGBA1C 6.0 (H) 11/15/2021        Urinalysis Labs (Brief)          Component Value Date/Time    COLORURINE YELLOW 12/24/2021 0244    APPEARANCEUR TURBID (A) 12/24/2021 0244    APPEARANCEUR Cloudy (A) 02/23/2021 1343    LABSPEC 1.016 12/24/2021 0244    PHURINE 5.0 12/24/2021 0244    GLUCOSEU NEGATIVE 12/24/2021 0244    HGBUR SMALL (A) 12/24/2021 0244    BILIRUBINUR NEGATIVE 12/24/2021 0244    BILIRUBINUR Negative 02/23/2021 1343    KETONESUR 5 (A) 12/24/2021 0244    PROTEINUR 100 (A) 12/24/2021 0244    UROBILINOGEN 0.2 09/08/2014 0436    NITRITE NEGATIVE 12/24/2021 0244    LEUKOCYTESUR LARGE (A) 12/24/2021 0244        Recent Labs       Lab Results  Component Value Date    LABMICR See below: 02/23/2021    WBCUA 11-30 (A) 02/23/2021    LABEPIT 0-10 02/23/2021    MUCUS Present 02/23/2021    BACTERIA MANY (A) 12/24/2021        Pertinent Imaging: CT 02/23/2022: Images reviewed and discussed with the patient  No results found for this or any previous visit.   No results found for this or any previous visit.   No results found for this or  any previous visit.   No results found for this or any previous visit.   No results found for this or any previous visit.   No valid procedures specified. No results found for this or any previous visit.   Results for orders placed during the hospital encounter of 02/23/22   CT Renal Stone Study   Narrative CLINICAL DATA:  Nephrostomy tube catheter pulled out about 3 inches in a patient with large renal calculus.   EXAM: CT ABDOMEN AND PELVIS WITHOUT CONTRAST   TECHNIQUE: Multidetector CT imaging of the abdomen and pelvis was performed following the standard protocol without IV contrast.   RADIATION DOSE REDUCTION: This exam was performed according to the departmental dose-optimization program which includes automated exposure control, adjustment of the mA and/or kV according to patient size and/or use of iterative reconstruction technique.   COMPARISON:  January 31, 2022   FINDINGS: Lower chest: Slightly improved aeration at the RIGHT lung base still with patchy ground-glass nodularity, volume loss and consolidative changes. These are mild and improved since previous imaging. No pleural effusion. Mild LEFT basilar atelectasis.  Hepatobiliary: Nodular hepatic contour with signs of fissural widening. Sludge in the gallbladder. No pericholecystic stranding. No gross biliary duct dilation.   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 No sign of pancreatic inflammation.   Spleen: Mild splenomegaly unchanged.   Adrenals/Urinary Tract: Adrenal glands are normal.   Marked perinephric stranding. Nephrostomy tube in place with marker for sideholes within the parenchyma of the interpolar RIGHT kidney, not extending beyond the renal parenchyma. Marker for sideholes with similar appearance in terms of positioning compared to previous imaging. Pigtail portion is coiled within lower pole collecting system elements similar to previous imaging. Large branch type calculus  with lamellated appearance extends into the UPJ from the renal sinus measuring 3.7 x 2.7 cm similar to prior imaging. Renal cysts and marked RIGHT renal cortical atrophy are similar to prior imaging as well.   LEFT renal lesions are unchanged including an intermediate density lesion arising from the lower pole measuring approximately 1.7 cm, this area shown to represent a Bosniak category II cyst on prior imaging. LEFT renal cysts are stable. No dedicated follow-up imaging is suggested for renal cysts.   Urinary bladder is collapsed with Foley catheter in place. There is no hydronephrosis. Distal RIGHT ureteral calculi with similar appearance, largest measuring 11 mm just proximal to the RIGHT UVJ. Two additional calculi present in the RIGHT ureter. The largest 11 mm calculus may have progressed slightly towards the UVJ since previous imaging.   Stomach/Bowel: Signs of fecal impaction with large burden of stool in the rectum which shows wall thickening despite rectal distension. Perirectal stranding is similar to previous imaging. Perianal stranding also present. Rectum distended up to 9.5 cm. Slightly increased stool in the distal sigmoid. No signs of overt obstruction of upstream loops of bowel currently. Stomach under distended. No signs of small bowel dilation.   Vascular/Lymphatic:   Aortic atherosclerosis. No sign of aneurysm. Smooth contour of the IVC. There is no gastrohepatic or hepatoduodenal ligament lymphadenopathy. No retroperitoneal or mesenteric lymphadenopathy.   No pelvic sidewall lymphadenopathy.   Atherosclerotic changes are moderate to marked.   Reproductive: Post hysterectomy.   Other: No free air or ascites.  No pneumatosis.   Musculoskeletal: Destructive changes about L2-3 with chronic appearance. Levo convexity of the spine and degenerative changes. Marked muscular atrophy.   Stage IV decubitus ulceration overlying the RIGHT ischium with sclerotic  appearing ischial tuberosity suggesting chronic osteomyelitis. Small amount of gas tracking posterior to the acetabulum and along the RIGHT sacro tuberous ligament. Fluid containing tract extending along the structures previously. Gas in continuity with the skin ulceration and perhaps with some packing material in the wound.   Adjacent perianal thickening/stranding in the setting of suspected fecal impaction. No well-formed fluid collection.   IMPRESSION: 1. Nephrostomy tube remaining in lower pole collecting systems of the RIGHT kidney, no signs of hydronephrosis with similar appearance of large staghorn calculus and overall similar appearance of distal ureteral calculi, perhaps mild distal migration of the largest calculus. No hydronephrosis. 2. Signs of stercoral proctitis and colitis due to fecal impaction similar to previous imaging. 3. Decubitus ulceration with local extension of fluid in gas along the acetabulum and sacrotuberous ligament. Fluid in these areas previously but no well-formed collection. Findings favored to represent gas tracking from the open wound into these areas but would suggest close attention on follow-up and correlation with direct clinical inspection to ensure no worsening and development of more aggressive soft tissue infection. 4. Aortic atherosclerosis. 5. Nodular hepatic contours raising the  question of background liver disease. 6. Contralateral LEFT nephrolithiasis.   Aortic Atherosclerosis (ICD10-I70.0).     Electronically Signed By: Donzetta Kohut M.D. On: 02/23/2022 14:46     Assessment & Plan:     1. Nephrolithiasis -We discussed the management of kidney stones. These options include observation, ureteroscopy, shockwave lithotripsy (ESWL) and percutaneous nephrolithotomy (PCNL). We discussed which options are relevant to the patient's stone(s). We discussed the natural history of kidney stones as well as the complications of untreated  stones and the impact on quality of life without treatment as well as with each of the above listed treatments. We also discussed the efficacy of each treatment in its ability to clear the stone burden. With any of these management options I discussed the signs and symptoms of infection and the need for emergent treatment should these be experienced. For each option we discussed the ability of each procedure to clear the patient of their stone burden.   For observation I described the risks which include but are not limited to silent renal damage, life-threatening infection, need for emergent surgery, failure to pass stone and pain.   For ureteroscopy I described the risks which include bleeding, infection, damage to contiguous structures, positioning injury, ureteral stricture, ureteral avulsion, ureteral injury, need for prolonged ureteral stent, inability to perform ureteroscopy, need for an interval procedure, inability to clear stone burden, stent discomfort/pain, heart attack, stroke, pulmonary embolus and the inherent risks with general anesthesia.   For shockwave lithotripsy I described the risks which include arrhythmia, kidney contusion, kidney hemorrhage, need for transfusion, pain, inability to adequately break up stone, inability to pass stone fragments, Steinstrasse, infection associated with obstructing stones, need for alternate surgical procedure, need for repeat shockwave lithotripsy, MI, CVA, PE and the inherent risks with anesthesia/conscious sedation.   For PCNL I described the risks including positioning injury, pneumothorax, hydrothorax, need for chest tube, inability to clear stone burden, renal laceration, arterial venous fistula or malformation, need for embolization of kidney, loss of kidney or renal function, need for repeat procedure, need for prolonged nephrostomy tube, ureteral avulsion, MI, CVA, PE and the inherent risks of general anesthesia.   - The patient would like to  proceed with right ureteroscopic stone extraction     No follow-ups on file.   Wilkie Aye, MD   [facility] [place]

## 2022-07-21 NOTE — Op Note (Signed)
Preoperative diagnosis: Right renal stone  Postoperative diagnosis: Same  Procedure: 1 cystoscopy 2. Right retrograde pyelography 3.  Intraoperative fluoroscopy, under one hour, with interpretation  Attending: Cleda Mccreedy  Anesthesia: General  Estimated blood loss: None  Drains: Right nephroureteral stent  Specimens: none  Antibiotics: ancef  Findings: Right staghorn calculus 5-6cm. No hydronephrosis. No masses/lesions in the bladder. Ureteral orifices in normal anatomic location.  Indications: Patient is a 77 year old female with a history of right renal stone and who has persistent right flank pain She has a nephrostomy tube in place.  After discussing treatment options, she decided proceed with right ureteroscopic stone manipulation.  Procedure in detail: The patient was brought to the operating room and a brief timeout was done to ensure correct patient, correct procedure, correct site.  General anesthesia was administered patient was placed in dorsal lithotomy position.  Her genitalia was then prepped and draped in usual sterile fashion.  A rigid 22 French cystoscope was passed in the urethra and the bladder.  Bladder was inspected free masses or lesions.  the ureteral orifices were in the normal orthotopic locations.  a 6 french ureteral catheter was then instilled into the right ureteral orifice.  a gentle retrograde was obtained and findings noted above. We noted the 2.5cm renal calculus had grown in size to 5-6cm. We elected not to proceed with ureteroscopy  the bladder was then drained and this concluded the procedure which was well tolerated by patient.  Complications: None  Condition: Stable, extubated, transferred to PACU  Plan: Patient is to be discharged home as to follow-up in 3-4 weeks for PCNL

## 2022-07-21 NOTE — Anesthesia Postprocedure Evaluation (Signed)
Anesthesia Post Note  Patient: [name]  Procedure(s) Performed: CYSTOSCOPY (Right: Renal)  Patient location during evaluation: Phase II Anesthesia Type: General Level of consciousness: awake and alert and oriented Pain management: pain level controlled Vital Signs Assessment: post-procedure vital signs reviewed and stable Respiratory status: spontaneous breathing, nonlabored ventilation and respiratory function stable Cardiovascular status: blood pressure returned to baseline and stable Postop Assessment: no apparent nausea or vomiting Anesthetic complications: no  No notable events documented.   Last Vitals:  Vitals:   07/21/22 1141 07/21/22 1145  BP: 116/64   Pulse: 81 78  Resp: 12 16  Temp: 36.6 C   SpO2: 100% 100%    Last Pain:  Vitals:   07/21/22 1141  TempSrc:   PainSc: 0-No pain                 Christophr Calix C Raiden Yearwood

## 2022-07-21 NOTE — Anesthesia Preprocedure Evaluation (Signed)
Anesthesia Evaluation  Patient identified by MRN, date of birth, ID band Patient awake    Reviewed: Allergy & Precautions, H&P , NPO status , Patient's Chart, lab work & pertinent test results  Airway Mallampati: II  TM Distance: >3 FB Neck ROM: Full    Dental  (+) Edentulous Upper, Missing, Dental Advisory Given   Pulmonary asthma , COPD, former smoker   Pulmonary exam normal breath sounds clear to auscultation       Cardiovascular METS (paraplegic): hypertension, Pt. on medications Normal cardiovascular exam Rhythm:Regular Rate:Normal     Neuro/Psych  PSYCHIATRIC DISORDERS Anxiety Depression Bipolar Disorder    Neuromuscular disease (paraplegic)    GI/Hepatic Neg liver ROS,GERD  Medicated and Controlled,,  Endo/Other  diabetes, Well Controlled, Type 2Hypothyroidism    Renal/GU Renal disease (stones)  negative genitourinary   Musculoskeletal  (+) Arthritis , Osteoarthritis,    Abdominal   Peds negative pediatric ROS (+)  Hematology negative hematology ROS (+)   Anesthesia Other Findings   Reproductive/Obstetrics negative OB ROS                             Anesthesia Physical Anesthesia Plan  ASA: 3  Anesthesia Plan: General   Post-op Pain Management: Dilaudid IV   Induction: Intravenous  PONV Risk Score and Plan: 4 or greater and Ondansetron, Dexamethasone and Metaclopromide  Airway Management Planned: Oral ETT  Additional Equipment:   Intra-op Plan:   Post-operative Plan: Extubation in OR  Informed Consent: I have reviewed the patients History and Physical, chart, labs and discussed the procedure including the risks, benefits and alternatives for the proposed anesthesia with the patient or authorized representative who has indicated his/her understanding and acceptance.     Dental advisory given  Plan Discussed with: CRNA and Surgeon  Anesthesia Plan Comments:         Anesthesia Quick Evaluation

## 2022-07-21 NOTE — Anesthesia Procedure Notes (Signed)
Procedure Name: Intubation Date/Time: 07/21/2022 11:01 AM  Performed by: Chelsea Aus, CRNAPre-anesthesia Checklist: Patient identified, Emergency Drugs available, Suction available and Patient being monitored Patient Re-evaluated:Patient Re-evaluated prior to induction Oxygen Delivery Method: Circle system utilized Preoxygenation: Pre-oxygenation with 100% oxygen Induction Type: IV induction Ventilation: Mask ventilation without difficulty and Oral airway inserted - appropriate to patient size Laryngoscope Size: Mac and 3 Grade View: Grade I Tube type: Oral Tube size: 7.0 mm Number of attempts: 1 Placement Confirmation: ETT inserted through vocal cords under direct vision, positive ETCO2 and breath sounds checked- equal and bilateral Secured at: 22 cm Tube secured with: Tape Dental Injury: Teeth and Oropharynx as per pre-operative assessment

## 2022-07-21 NOTE — Transfer of Care (Signed)
Immediate Anesthesia Transfer of Care Note  Patient: [name]  Procedure(s) Performed: CYSTOSCOPY (Right: Renal)  Patient Location: PACU  Anesthesia Type:General  Level of Consciousness: awake, alert , and oriented  Airway & Oxygen Therapy: Patient Spontanous Breathing and Patient connected to face mask oxygen  Post-op Assessment: Report given to RN and Post -op Vital signs reviewed and stable  Post vital signs: Reviewed and stable  Last Vitals:  Vitals Value Taken Time  BP 115/64 07/21/22 1118  Temp    Pulse 78 07/21/22 1122  Resp 15 07/21/22 1122  SpO2 100 % 07/21/22 1122  Vitals shown include unvalidated device data.  Last Pain:  Vitals:   07/21/22 1009  TempSrc: Oral  PainSc: 6       Patients Stated Pain Goal: 0 (07/21/22 1009)  Complications: No notable events documented.

## 2022-07-26 ENCOUNTER — Encounter (HOSPITAL_COMMUNITY): Payer: Self-pay | Admitting: Urology

## 2022-07-29 ENCOUNTER — Ambulatory Visit: Payer: Medicare Other | Admitting: Urology

## 2022-07-29 DIAGNOSIS — N201 Calculus of ureter: Secondary | ICD-10-CM

## 2022-08-12 ENCOUNTER — Other Ambulatory Visit (HOSPITAL_COMMUNITY): Payer: Medicare Other

## 2022-08-19 ENCOUNTER — Ambulatory Visit (HOSPITAL_COMMUNITY): Payer: Medicare Other

## 2022-08-23 ENCOUNTER — Ambulatory Visit (HOSPITAL_COMMUNITY): Payer: Medicare Other

## 2022-08-24 ENCOUNTER — Ambulatory Visit (HOSPITAL_COMMUNITY)
Admission: RE | Admit: 2022-08-24 | Discharge: 2022-08-24 | Disposition: A | Discharge status: Home / Self Care | Payer: Medicare Other | Source: Ambulatory Visit | Attending: Student | Admitting: Student

## 2022-08-24 ENCOUNTER — Other Ambulatory Visit (HOSPITAL_COMMUNITY): Payer: Self-pay | Admitting: Student

## 2022-08-24 DIAGNOSIS — N201 Calculus of ureter: Secondary | ICD-10-CM | POA: Diagnosis present

## 2022-08-24 DIAGNOSIS — N99528 Other complication of other external stoma of urinary tract: Secondary | ICD-10-CM

## 2022-08-24 HISTORY — PX: IR EXT NEPHROURETERAL CATH EXCHANGE: IMG5418

## 2022-08-24 MED ORDER — LIDOCAINE HCL 1 % IJ SOLN
INTRAMUSCULAR | Status: AC
  Filled 2022-08-24: qty 20

## 2022-08-24 MED ORDER — IOHEXOL 300 MG/ML  SOLN
50.0000 mL | Freq: Once | INTRAMUSCULAR | Status: AC | PRN
  Administered 2022-08-24: 25 mL

## 2022-09-06 ENCOUNTER — Emergency Department (HOSPITAL_COMMUNITY): Payer: Medicare Other

## 2022-09-06 ENCOUNTER — Telehealth: Payer: Self-pay

## 2022-09-06 ENCOUNTER — Other Ambulatory Visit: Payer: Self-pay

## 2022-09-06 ENCOUNTER — Emergency Department (HOSPITAL_COMMUNITY)
Admission: EM | Admit: 2022-09-06 | Discharge: 2022-09-06 | Disposition: A | Discharge status: Home / Self Care | Payer: Medicare Other | Attending: Emergency Medicine | Admitting: Emergency Medicine

## 2022-09-06 ENCOUNTER — Encounter (HOSPITAL_COMMUNITY): Payer: Self-pay

## 2022-09-06 DIAGNOSIS — T83122A Displacement of urinary stent, initial encounter: Secondary | ICD-10-CM | POA: Insufficient documentation

## 2022-09-06 DIAGNOSIS — E119 Type 2 diabetes mellitus without complications: Secondary | ICD-10-CM | POA: Diagnosis not present

## 2022-09-06 DIAGNOSIS — Z7951 Long term (current) use of inhaled steroids: Secondary | ICD-10-CM | POA: Insufficient documentation

## 2022-09-06 DIAGNOSIS — I10 Essential (primary) hypertension: Secondary | ICD-10-CM | POA: Diagnosis not present

## 2022-09-06 DIAGNOSIS — Y828 Other medical devices associated with adverse incidents: Secondary | ICD-10-CM | POA: Diagnosis not present

## 2022-09-06 DIAGNOSIS — J449 Chronic obstructive pulmonary disease, unspecified: Secondary | ICD-10-CM | POA: Insufficient documentation

## 2022-09-06 DIAGNOSIS — Z79899 Other long term (current) drug therapy: Secondary | ICD-10-CM | POA: Insufficient documentation

## 2022-09-06 NOTE — ED Provider Notes (Signed)
[place] EMERGENCY DEPARTMENT AT [facility] Provider Note   CSN: [id number] Arrival date & time: 09/06/22  1042     History  No chief complaint on file.   [name] is a 77 y.o. female with history of paraplegia, hypertension, bipolar 1 disorder, thyroid disease, diabetes, COPD, GERD who presents emergency department with concern for stent displacement.  Patient brought in by EMS from [facility] living facility.  States that she has a stent placed by the urologist for a very large kidney stone on the right.  She is scheduled for a repeat surgery on 6/20 to remove it.  She states that when the staff was exchanging her Foley catheter and doing some cleaning, they noticed what looked like her ureteral stent.  They called the urologist office who recommended that they come to the ER for evaluation. Patient not complaining of any pain.   HPI     Home Medications Prior to Admission medications   Medication Sig Start Date End Date Taking? Authorizing Provider  acetaminophen (TYLENOL) 325 MG tablet Take 1 tablet (325 mg total) by mouth every 6 (six) hours as needed for mild pain, moderate pain, fever or headache. *May take one every 6 hours as needed for pain Patient taking differently: Take 650 mg by mouth every 6 (six) hours as needed (for pain). 12/29/21   Rai, Ripudeep K, MD  ARTIFICIAL TEARS 1.4 % ophthalmic solution Place 1 drop into both eyes every 12 (twelve) hours as needed for dry eyes.    [provider]  atorvastatin (LIPITOR) 10 MG tablet Take 10 mg by mouth every evening.    [provider]  azelastine (ASTELIN) 0.1 % nasal spray Place 2 sprays into both nostrils 2 (two) times daily.    [provider]  b complex vitamins tablet Take 1 tablet by mouth daily.    [provider]  Calcium Carb-Cholecalciferol (CALCIUM CARBONATE-VITAMIN D3 PO) Take 1 tablet by mouth in the morning and at bedtime.    [provider]   CALMOSEPTINE 0.44-20.6 % OINT Apply 1 application  topically every 8 (eight) hours as needed (for moisture-associated skin damage- right rear thigh/buttocks). Apply every 8 hours as needed for for moisture-associated skin damage (right rear thigh/buttocks) and at bedtime (SCHEDULED)    [provider]  carvedilol (COREG) 6.25 MG tablet Take 6.25 mg by mouth daily. SBP <100 and or HR <80    [provider]  Cranberry 500 MG TABS Take 500 mg by mouth daily.    [provider]  dextromethorphan-guaiFENesin (MUCINEX DM) 30-600 MG per 12 hr tablet Take 1 tablet by mouth every 12 (twelve) hours as needed for cough.    [provider]  docusate sodium (COLACE) 100 MG capsule Take 200 mg by mouth at bedtime.    [provider]  ezetimibe (ZETIA) 10 MG tablet Take 10 mg by mouth at bedtime.     [provider]  famotidine (PEPCID) 20 MG tablet Take 20 mg by mouth 2 (two) times daily.    [provider]  ferrous sulfate 325 (65 FE) MG EC tablet Take 325 mg by mouth 3 (three) times daily with meals.    [provider]  fluticasone (FLONASE) 50 MCG/ACT nasal spray Place 2 sprays into both nostrils in the morning. 09/03/14   [provider]  furosemide (LASIX) 20 MG tablet Take 20 mg by mouth daily.    [provider]  gabapentin (NEURONTIN) 300 MG  capsule Take 300 mg by mouth 3 (three) times daily.    [provider]  ipratropium-albuterol (DUONEB) 0.5-2.5 (3) MG/3ML SOLN Take 3 mLs by nebulization every 6 (six) hours as needed (shortness of breath). 11/19/21   Shon Hale, MD  Lactobacillus (ACIDOPHILUS PO) Take 1 capsule by mouth daily. Probiotic 250 mg    [provider]  lisinopril (ZESTRIL) 2.5 MG tablet Take 1.25 mg by mouth daily.    [provider]  loratadine (CLARITIN) 10 MG tablet Take 10 mg by mouth daily as needed for allergies (for seasonal allergies). q24    [provider]  Magnesium Oxide -Mg Supplement (MAG-OXIDE) 200 MG TABS Take 400 mg by mouth 2 (two) times daily.    [provider]  midodrine (PROAMATINE) 5 MG tablet Take 1 tablet (5 mg total) by mouth 3 (three) times daily with meals. Patient not taking: Reported on 07/14/2022 12/29/21   Rai, Delene Ruffini, MD  mirabegron ER (MYRBETRIQ) 25 MG TB24 tablet Take 25 mg by mouth in the morning.    [provider]  Nutritional Supplements (ARGINAID) PACK Take 1 packet by mouth daily.    [provider]  olopatadine (PATADAY) 0.1 % ophthalmic solution Place 1 drop into both eyes every 6 (six) hours as needed for allergies.    [provider]  polyethylene glycol (MIRALAX / GLYCOLAX) 17 g packet Take 17 g by mouth daily as needed for moderate constipation. Patient taking differently: Take 17 g by mouth daily as needed (for constipation- mix as directed). 12/29/21   Rai, Ripudeep K, MD  potassium chloride (KLOR-CON) 10 MEQ tablet Take 10 mEq by mouth daily. Do not crush    [provider]  promethazine (PHENERGAN) 12.5 MG tablet Take 12.5 mg by mouth every 12 (twelve) hours as needed for nausea or vomiting.    [provider]  rizatriptan (MAXALT) 10 MG tablet Take 10 mg by mouth daily as needed for migraine.    [provider]  sodium hypochlorite (DAKIN'S 1/4 STRENGTH) 0.125 % SOLN Apply 1 Application topically See admin instructions. Apply to wound bed topically every day shift for healing right buttock    [provider]  SYNTHROID 200 MCG tablet Take 200 mcg by mouth daily before breakfast.    [provider]  tamsulosin (FLOMAX) 0.4 MG CAPS capsule Take 1 capsule (0.4 mg total) by mouth daily after supper. Patient taking differently: Take 0.4 mg by mouth every evening. 11/19/21   Shon Hale, MD  topiramate (TOPAMAX) 50 MG tablet Take 50 mg by mouth 2 (two) times daily.    [provider]  vitamin C (ASCORBIC ACID) 500 MG  tablet Take 500 mg by mouth 2 (two) times daily.    [provider]  Vitamin D, Ergocalciferol, (DRISDOL) 1.25 MG (50000 UNIT) CAPS capsule Take 50,000 Units by mouth every Saturday.    [provider]      Allergies    Codeine, Invanz [ertapenem], Propranolol hcl, Rocephin [ceftriaxone], and Vancomycin    Review of Systems   Review of Systems  All other systems reviewed and are negative.   Physical Exam Updated Vital Signs BP 129/69 (BP Location: Left Arm)   Pulse 72   Temp 98.3 F (36.8 C) (Oral)   Resp 14   Ht 5\' 7"  (1.702 m)   Wt 102.1 kg   SpO2 97%   BMI 35.24 kg/m  Physical Exam Vitals and nursing note reviewed. Exam conducted with a chaperone  present.  Constitutional:      Appearance: Normal appearance.     Comments: Bed bound at baseline  HENT:     Head: Normocephalic and atraumatic.  Eyes:     Conjunctiva/sclera: Conjunctivae normal.  Pulmonary:     Effort: Pulmonary effort is normal. No respiratory distress.  Genitourinary:    Comments: Foley catheter in place, draining clear yellow urine. Removed and reexamined, visualized distal stent with pigtail Skin:    General: Skin is warm and dry.  Neurological:     Mental Status: She is alert.  Psychiatric:        Mood and Affect: Mood normal.        Behavior: Behavior normal.     ED Results / Procedures / Treatments   Labs (all labs ordered are listed, but only abnormal results are displayed) Labs Reviewed - No data to display  EKG None  Radiology DG Abd Portable 1 View  Result Date: 09/06/2022 CLINICAL DATA:  Nephroureteral stent placement. EXAM: PORTABLE ABDOMEN - 1 VIEW COMPARISON:  Nephroureteral stent exchange dated Aug 24, 2022. FINDINGS: Right nephroureteral stent appears minimally retracted when compared to final image from recent catheter exchange, and is still likely in appropriate position. Unchanged staghorn right renal calculus. Large amount of stool in the rectosigmoid colon  again noted. IMPRESSION: 1. Right nephroureteral stent appears minimally retracted compared to recent catheter exchange, and is still likely in appropriate position. Electronically Signed   By: Obie Dredge M.D.   On: 09/06/2022 13:22    Procedures Procedures    Medications Ordered in ED Medications - No data to display  ED Course/ Medical Decision Making/ A&P                             Medical Decision Making Amount and/or Complexity of Data Reviewed Radiology: ordered.   This patient is a 77 y.o. female  who presents to the ED for concern of ureteral stent displacement.   Past Medical History / Co-morbidities / Social History: paraplegia, hypertension, bipolar 1 disorder, thyroid disease, diabetes, COPD, GERD  Additional history: Chart reviewed. Pertinent results include: Reviewed operative note from 4/25 with Dr. Ronne Binning of urology in which patient underwent cystoscopy and pyelography.  Was noted to have renal calculus 5 to 6 cm.  It was elected not to proceed with uteroscopy.  Has a nephrostomy tube in place, last changed in March.  Patient scheduled for percutaneous right nephrolithotomy on 6/20.  Physical Exam: Physical exam performed. The pertinent findings include: Foley catheter removed, can visualize distal ureteral stent with pigtail  Lab Tests/Imaging studies: I personally interpreted labs/imaging and the pertinent results include:  abdominal xray shows right nephroureteral stent with minimal retraction. I agree with the radiologist interpretation.  Consultations obtained: I consulted with on call urologist Dr Retta Diones who recommended replacing foley catheter and following up in urology office. He did not believe there was any emergent need for intervention, but recommends patient will need IR intervention but this can be done outpatient.    Disposition: After consideration of the diagnostic results and the patients response to treatment, I feel that emergency  department workup does not suggest an emergent condition requiring admission or immediate intervention beyond what has been performed at this time. The plan is: discharge to home with urology follow up for stent displacement. Foley draining well, otherwise abdominal XR looks like stent is in the right place. The patient is safe for discharge and  has been instructed to return immediately for worsening symptoms, change in symptoms or any other concerns.  Final Clinical Impression(s) / ED Diagnoses Final diagnoses:  Displacement of ureteral stent, initial encounter [facility]    Rx / DC Orders ED Discharge Orders     None      Portions of this report may have been transcribed using voice recognition software. Every effort was made to ensure accuracy; however, inadvertent computerized transcription errors may be present.    Jeanella Flattery 09/06/22 1820    Pricilla Loveless, MD 09/07/22 [phone]

## 2022-09-06 NOTE — ED Triage Notes (Signed)
[facility] from [place] for c/o stent coming out from right kidney area. Still has a stone there says scheduled for 17th preop to have procedure done to remove it. States its the size of a baseball. Not in pain because pt is paralyzed but she still feels "discomfort"

## 2022-09-06 NOTE — Discharge Instructions (Signed)
You were seen in the ER for possible stent displacement.  We can visualize the very bottom of your ureteral stent. We exchanged your foley catheter.   I discussed with the urologist Dr Retta Diones who was not concerned about the displacement. You will need it adjusted/replaced with the interventional radiologists, and they will try to coordinate this for you on an outpatient basis. Their office should reach out to schedule this.  Continue to monitor how you're doing and return to the ER for new or worsening symptoms such as worsening pain, foley catheter no longer draining, etc.

## 2022-09-06 NOTE — Telephone Encounter (Signed)
Received a call from [facility] stating patient had a stent coming out of her vagina. Caller states patient was refusing to go to the hospital and wanted her to call the office to see what we could do. Per MD op note patient is noted to have a right nephroureteral stent. Md not in office. Caller informed that there is nothing that could be done for patient in the office. Caller informed that if they felt that patient needed to be assessed beyond their capability that patient should go to the ER for evaluation. Caller voiced understanding.

## 2022-09-09 NOTE — Patient Instructions (Signed)
[name]  09/09/2022     @PREFPERIOPPHARMACY @   Your procedure is scheduled on  09/15/2022.   Report to [facility] at  1030  A.M.   Call this number if you have problems the morning of surgery:  [phone]  If you experience any cold or flu symptoms such as cough, fever, chills, shortness of breath, etc. between now and your scheduled surgery, please notify us at the above number.   Remember:  Do not eat or drink after midnight.       Use your nebulizer and your inhaler before you come and bring your rescue inhaler with you.      Take these medicines the morning of surgery with A SIP OF WATER         carvedilol, zyrtec, famotidine, gabapentin, mirabegron, maxalt(if needed), synthroid, topamax.     Do not wear jewelry, make-up or nail polish, including gel polish,  artificial nails, or any other type of covering on natural nails (fingers and  toes).  Do not wear lotions, powders, or perfumes, or deodorant.  Do not shave 48 hours prior to surgery.  Men may shave face and neck.  Do not bring valuables to the hospital.  [facility] is not responsible for any belongings or valuables.  Contacts, dentures or bridgework may not be worn into surgery.  Leave your suitcase in the car.  After surgery it may be brought to your room.  For patients admitted to the hospital, discharge time will be determined by your treatment team.  Patients discharged the day of surgery will not be allowed to drive home and must have someone with them for 24 hours.    Special instructions:   DO NOT smoke tobacco or vape for 24 hours before your procedure.  Please read over the following fact sheets that you were given. Pain Booklet, Coughing and Deep Breathing, Surgical Site Infection Prevention, Anesthesia Post-op Instructions, and Care and Recovery After Surgery      Percutaneous Nephrolithotomy, Care After After percutaneous nephrolithotomy, it is common to have: Soreness  or pain. A small amount of blood or clear fluid coming from your incision for a few days. Tiredness (fatigue). Some blood in your pee (urine). This will last for a few days. A feeling of needing to pee (urinate) often. It may feel urgent. You may have this if you have a small mesh tube (stent) in the part of your body that connects your bladder to your kidneys (ureter). Follow these instructions at home: Medicines Take over-the-counter and prescription medicines only as told by your health care provider. If you were prescribed antibiotics, take them as told by your provider. Do not stop using the antibiotic even if you start to feel better. Ask your provider if the medicine prescribed to you: Requires you to avoid driving or using machinery. Can cause constipation. You may need to take these actions to prevent or treat constipation: Drink enough fluid to keep your pee pale yellow. Take over-the-counter or prescription medicines. Eat foods that are high in fiber, such as beans, whole grains, and fresh fruits and vegetables. Limit foods that are high in fat and processed sugars, such as fried or sweet foods. Incision care  Follow instructions from your provider about how to take care of your incision. Make sure you: Wash your hands with soap and water for at least 20 seconds before and after you change your bandage (dressing). If soap and water  are not available, use hand sanitizer. Change your dressing as told by your provider. Leave stitches (sutures), skin glue, or tape strips in place. These skin closures may need to stay in place for 2 weeks or longer. If tape strip edges start to loosen and curl up, you may trim the loose edges. Do not remove tape strips completely unless your provider tells you to do that. Check your incision area every day for signs of infection. Check for: More redness, swelling, or pain. More fluid or blood. Warmth. Pus or a bad smell. Do not take baths, swim, or  use a hot tub until your provider approves. Ask your provider if you may take showers. You may only be allowed to take sponge baths. Activity Avoid activities that take a lot of effort for as long as told by your provider. Return to your normal activities as told by your provider. Ask your provider what activities are safe for you. General instructions If you were sent home with a soft tube (catheter) or a surgical drain (nephrostomy tube), follow your provider's instructions on how to take care of it. Wear compression stockings as told by your provider. These stockings help to prevent blood clots and reduce swelling in your legs. Do not use any products that contain nicotine or tobacco. These products include cigarettes, chewing tobacco, and vaping devices, such as e-cigarettes. These can delay incision healing after surgery. If you need help quitting, ask your provider. Keep all follow-up visits. If you have a stent, it will need to be removed after 1-2 weeks. Your provider may give you more instructions. Make sure you know what you can and cannot do. Contact a health care provider if: You have a fever. Your incision shows any signs of infection. You lose your appetite, feel nauseous, or vomit. You have a catheter, stent, or drain and the flow of pee stops all of a sudden. Then you have pain in your kidney. Get help right away if: There is a lot of blood in your pee. You have blood clots in your pee. You cannot pee. You have chest pain or trouble breathing. These symptoms may be an emergency. Get help right away. Call 911. Do not wait to see if the symptoms will go away. Do not drive yourself to the hospital. This information is not intended to replace advice given to you by your health care provider. Make sure you discuss any questions you have with your health care provider. Document Revised: 11/11/2021 Document Reviewed: 11/11/2021 Elsevier Patient Education  2024 [employer] General Anesthesia, Adult, Care After The following information offers guidance on how to care for yourself after your procedure. Your health care provider may also give you more specific instructions. If you have problems or questions, contact your health care provider. What can I expect after the procedure? After the procedure, it is common for people to: Have pain or discomfort at the IV site. Have nausea or vomiting. Have a sore throat or hoarseness. Have trouble concentrating. Feel cold or chills. Feel weak, sleepy, or tired (fatigue). Have soreness and body aches. These can affect parts of the body that were not involved in surgery. Follow these instructions at home: For the time period you were told by your health care provider:  Rest. Do not participate in activities where you could fall or become injured. Do not drive or use machinery. Do not drink alcohol. Do not take sleeping pills or medicines that cause drowsiness. Do not make important decisions  or sign legal documents. Do not take care of children on your own. General instructions Drink enough fluid to keep your urine pale yellow. If you have sleep apnea, surgery and certain medicines can increase your risk for breathing problems. Follow instructions from your health care provider about wearing your sleep device: Anytime you are sleeping, including during daytime naps. While taking prescription pain medicines, sleeping medicines, or medicines that make you drowsy. Return to your normal activities as told by your health care provider. Ask your health care provider what activities are safe for you. Take over-the-counter and prescription medicines only as told by your health care provider. Do not use any products that contain nicotine or tobacco. These products include cigarettes, chewing tobacco, and vaping devices, such as e-cigarettes. These can delay incision healing after surgery. If you need help quitting, ask your  health care provider. Contact a health care provider if: You have nausea or vomiting that does not get better with medicine. You vomit every time you eat or drink. You have pain that does not get better with medicine. You cannot urinate or have bloody urine. You develop a skin rash. You have a fever. Get help right away if: You have trouble breathing. You have chest pain. You vomit blood. These symptoms may be an emergency. Get help right away. Call 911. Do not wait to see if the symptoms will go away. Do not drive yourself to the hospital. Summary After the procedure, it is common to have a sore throat, hoarseness, nausea, vomiting, or to feel weak, sleepy, or fatigue. For the time period you were told by your health care provider, do not drive or use machinery. Get help right away if you have difficulty breathing, have chest pain, or vomit blood. These symptoms may be an emergency. This information is not intended to replace advice given to you by your health care provider. Make sure you discuss any questions you have with your health care provider. Document Revised: 06/11/2021 Document Reviewed: 06/11/2021 Elsevier Patient Education  2024 [employer]. How to Use Chlorhexidine Before Surgery Chlorhexidine gluconate (CHG) is a germ-killing (antiseptic) solution that is used to clean the skin. It can get rid of the bacteria that normally live on the skin and can keep them away for about 24 hours. To clean your skin with CHG, you may be given: A CHG solution to use in the shower or as part of a sponge bath. A prepackaged cloth that contains CHG. Cleaning your skin with CHG may help lower the risk for infection: While you are staying in the intensive care unit of the hospital. If you have a vascular access, such as a central line, to provide short-term or long-term access to your veins. If you have a catheter to drain urine from your bladder. If you are on a ventilator. A ventilator is  a machine that helps you breathe by moving air in and out of your lungs. After surgery. What are the risks? Risks of using CHG include: A skin reaction. Hearing loss, if CHG gets in your ears and you have a perforated eardrum. Eye injury, if CHG gets in your eyes and is not rinsed out. The CHG product catching fire. Make sure that you avoid smoking and flames after applying CHG to your skin. Do not use CHG: If you have a chlorhexidine allergy or have previously reacted to chlorhexidine. On babies younger than 30 months of age. How to use CHG solution Use CHG only as told by your  health care provider, and follow the instructions on the label. Use the full amount of CHG as directed. Usually, this is one bottle. During a shower Follow these steps when using CHG solution during a shower (unless your health care provider gives you different instructions): Start the shower. Use your normal soap and shampoo to wash your face and hair. Turn off the shower or move out of the shower stream. Pour the CHG onto a clean washcloth. Do not use any type of brush or rough-edged sponge. Starting at your neck, lather your body down to your toes. Make sure you follow these instructions: If you will be having surgery, pay special attention to the part of your body where you will be having surgery. Scrub this area for at least 1 minute. Do not use CHG on your head or face. If the solution gets into your ears or eyes, rinse them well with water. Avoid your genital area. Avoid any areas of skin that have broken skin, cuts, or scrapes. Scrub your back and under your arms. Make sure to wash skin folds. Let the lather sit on your skin for 1-2 minutes or as long as told by your health care provider. Thoroughly rinse your entire body in the shower. Make sure that all body creases and crevices are rinsed well. Dry off with a clean towel. Do not put any substances on your body afterward--such as powder, lotion, or  perfume--unless you are told to do so by your health care provider. Only use lotions that are recommended by the manufacturer. Put on clean clothes or pajamas. If it is the night before your surgery, sleep in clean sheets.  During a sponge bath Follow these steps when using CHG solution during a sponge bath (unless your health care provider gives you different instructions): Use your normal soap and shampoo to wash your face and hair. Pour the CHG onto a clean washcloth. Starting at your neck, lather your body down to your toes. Make sure you follow these instructions: If you will be having surgery, pay special attention to the part of your body where you will be having surgery. Scrub this area for at least 1 minute. Do not use CHG on your head or face. If the solution gets into your ears or eyes, rinse them well with water. Avoid your genital area. Avoid any areas of skin that have broken skin, cuts, or scrapes. Scrub your back and under your arms. Make sure to wash skin folds. Let the lather sit on your skin for 1-2 minutes or as long as told by your health care provider. Using a different clean, wet washcloth, thoroughly rinse your entire body. Make sure that all body creases and crevices are rinsed well. Dry off with a clean towel. Do not put any substances on your body afterward--such as powder, lotion, or perfume--unless you are told to do so by your health care provider. Only use lotions that are recommended by the manufacturer. Put on clean clothes or pajamas. If it is the night before your surgery, sleep in clean sheets. How to use CHG prepackaged cloths Only use CHG cloths as told by your health care provider, and follow the instructions on the label. Use the CHG cloth on clean, dry skin. Do not use the CHG cloth on your head or face unless your health care provider tells you to. When washing with the CHG cloth: Avoid your genital area. Avoid any areas of skin that have broken  skin, cuts, or scrapes.  Before surgery Follow these steps when using a CHG cloth to clean before surgery (unless your health care provider gives you different instructions): Using the CHG cloth, vigorously scrub the part of your body where you will be having surgery. Scrub using a back-and-forth motion for 3 minutes. The area on your body should be completely wet with CHG when you are done scrubbing. Do not rinse. Discard the cloth and let the area air-dry. Do not put any substances on the area afterward, such as powder, lotion, or perfume. Put on clean clothes or pajamas. If it is the night before your surgery, sleep in clean sheets.  For general bathing Follow these steps when using CHG cloths for general bathing (unless your health care provider gives you different instructions). Use a separate CHG cloth for each area of your body. Make sure you wash between any folds of skin and between your fingers and toes. Wash your body in the following order, switching to a new cloth after each step: The front of your neck, shoulders, and chest. Both of your arms, under your arms, and your hands. Your stomach and groin area, avoiding the genitals. Your right leg and foot. Your left leg and foot. The back of your neck, your back, and your buttocks. Do not rinse. Discard the cloth and let the area air-dry. Do not put any substances on your body afterward--such as powder, lotion, or perfume--unless you are told to do so by your health care provider. Only use lotions that are recommended by the manufacturer. Put on clean clothes or pajamas. Contact a health care provider if: Your skin gets irritated after scrubbing. You have questions about using your solution or cloth. You swallow any chlorhexidine. Call your local poison control center [phone] in the [place] Get help right away if: Your eyes itch badly, or they become very red or swollen. Your skin itches badly and is red or swollen. Your  hearing changes. You have trouble seeing. You have swelling or tingling in your mouth or throat. You have trouble breathing. These symptoms may represent a serious problem that is an emergency. Do not wait to see if the symptoms will go away. Get medical help right away. Call your local emergency services (911 in the [place] Do not drive yourself to the hospital. Summary Chlorhexidine gluconate (CHG) is a germ-killing (antiseptic) solution that is used to clean the skin. Cleaning your skin with CHG may help to lower your risk for infection. You may be given CHG to use for bathing. It may be in a bottle or in a prepackaged cloth to use on your skin. Carefully follow your health care provider's instructions and the instructions on the product label. Do not use CHG if you have a chlorhexidine allergy. Contact your health care provider if your skin gets irritated after scrubbing. This information is not intended to replace advice given to you by your health care provider. Make sure you discuss any questions you have with your health care provider. Document Revised: 07/12/2021 Document Reviewed: 05/25/2020 Elsevier Patient Education  2023 [employer].

## 2022-09-12 ENCOUNTER — Encounter (HOSPITAL_COMMUNITY)
Admission: RE | Admit: 2022-09-12 | Discharge: 2022-09-12 | Disposition: A | Discharge status: Home / Self Care | Payer: Medicare Other | Source: Ambulatory Visit | Attending: Urology | Admitting: Urology

## 2022-09-12 ENCOUNTER — Encounter (HOSPITAL_COMMUNITY): Payer: Self-pay

## 2022-09-12 VITALS — BP 129/69 | HR 72 | Temp 98.4°F | Resp 18 | Ht 67.0 in | Wt 225.0 lb

## 2022-09-12 DIAGNOSIS — D649 Anemia, unspecified: Secondary | ICD-10-CM | POA: Insufficient documentation

## 2022-09-12 DIAGNOSIS — E119 Type 2 diabetes mellitus without complications: Secondary | ICD-10-CM | POA: Insufficient documentation

## 2022-09-12 DIAGNOSIS — Z01812 Encounter for preprocedural laboratory examination: Secondary | ICD-10-CM | POA: Insufficient documentation

## 2022-09-12 HISTORY — DX: Unspecified osteoarthritis, unspecified site: M19.90

## 2022-09-12 HISTORY — DX: Dyspnea, unspecified: R06.00

## 2022-09-12 LAB — CBC WITH DIFFERENTIAL/PLATELET
Abs Immature Granulocytes: 0.01 10*3/uL (ref 0.00–0.07)
Basophils Absolute: 0 10*3/uL (ref 0.0–0.1)
Basophils Relative: 1 %
Eosinophils Absolute: 0.2 10*3/uL (ref 0.0–0.5)
Eosinophils Relative: 3 %
HCT: 38.9 % (ref 36.0–46.0)
Hemoglobin: 11.8 g/dL — ABNORMAL LOW (ref 12.0–15.0)
Immature Granulocytes: 0 %
Lymphocytes Relative: 25 %
Lymphs Abs: 1.8 10*3/uL (ref 0.7–4.0)
MCH: 27.4 pg (ref 26.0–34.0)
MCHC: 30.3 g/dL (ref 30.0–36.0)
MCV: 90.5 fL (ref 80.0–100.0)
Monocytes Absolute: 0.5 10*3/uL (ref 0.1–1.0)
Monocytes Relative: 7 %
Neutro Abs: 4.7 10*3/uL (ref 1.7–7.7)
Neutrophils Relative %: 64 %
Platelets: 183 10*3/uL (ref 150–400)
RBC: 4.3 MIL/uL (ref 3.87–5.11)
RDW: 15.4 % (ref 11.5–15.5)
WBC: 7.3 10*3/uL (ref 4.0–10.5)
nRBC: 0 % (ref 0.0–0.2)

## 2022-09-12 LAB — BASIC METABOLIC PANEL
Anion gap: 7 (ref 5–15)
BUN: 29 mg/dL — ABNORMAL HIGH (ref 8–23)
CO2: 24 mmol/L (ref 22–32)
Calcium: 9.1 mg/dL (ref 8.9–10.3)
Chloride: 110 mmol/L (ref 98–111)
Creatinine, Ser: 0.53 mg/dL (ref 0.44–1.00)
GFR, Estimated: >60 mL/min (ref >60)
Glucose, Bld: 138 mg/dL — ABNORMAL HIGH (ref 70–99)
Potassium: 4.8 mmol/L (ref 3.5–5.1)
Sodium: 141 mmol/L (ref 135–145)

## 2022-09-15 ENCOUNTER — Encounter (HOSPITAL_COMMUNITY): Payer: Self-pay | Admitting: Urology

## 2022-09-15 ENCOUNTER — Observation Stay (HOSPITAL_COMMUNITY)
Admission: RE | Admit: 2022-09-15 | Discharge: 2022-09-17 | Disposition: A | Discharge status: Skilled Nursing Facility | Payer: Medicare Other | Attending: Urology | Admitting: Urology

## 2022-09-15 ENCOUNTER — Ambulatory Visit (HOSPITAL_BASED_OUTPATIENT_CLINIC_OR_DEPARTMENT_OTHER): Payer: Medicare Other | Admitting: Certified Registered Nurse Anesthetist

## 2022-09-15 ENCOUNTER — Ambulatory Visit (HOSPITAL_COMMUNITY): Payer: Medicare Other | Admitting: Certified Registered Nurse Anesthetist

## 2022-09-15 ENCOUNTER — Encounter (HOSPITAL_COMMUNITY)
Admission: RE | Disposition: A | Discharge status: Skilled Nursing Facility | Payer: Self-pay | Source: Home / Self Care | Attending: Urology

## 2022-09-15 ENCOUNTER — Ambulatory Visit (HOSPITAL_COMMUNITY): Payer: Medicare Other

## 2022-09-15 DIAGNOSIS — Z87891 Personal history of nicotine dependence: Secondary | ICD-10-CM | POA: Diagnosis not present

## 2022-09-15 DIAGNOSIS — Z79899 Other long term (current) drug therapy: Secondary | ICD-10-CM | POA: Diagnosis not present

## 2022-09-15 DIAGNOSIS — Z7952 Long term (current) use of systemic steroids: Secondary | ICD-10-CM | POA: Diagnosis not present

## 2022-09-15 DIAGNOSIS — J449 Chronic obstructive pulmonary disease, unspecified: Secondary | ICD-10-CM | POA: Diagnosis not present

## 2022-09-15 DIAGNOSIS — E039 Hypothyroidism, unspecified: Secondary | ICD-10-CM

## 2022-09-15 DIAGNOSIS — I10 Essential (primary) hypertension: Secondary | ICD-10-CM | POA: Insufficient documentation

## 2022-09-15 DIAGNOSIS — E119 Type 2 diabetes mellitus without complications: Secondary | ICD-10-CM | POA: Insufficient documentation

## 2022-09-15 DIAGNOSIS — J45909 Unspecified asthma, uncomplicated: Secondary | ICD-10-CM | POA: Diagnosis not present

## 2022-09-15 DIAGNOSIS — N200 Calculus of kidney: Secondary | ICD-10-CM

## 2022-09-15 DIAGNOSIS — N201 Calculus of ureter: Secondary | ICD-10-CM

## 2022-09-15 DIAGNOSIS — E1149 Type 2 diabetes mellitus with other diabetic neurological complication: Secondary | ICD-10-CM

## 2022-09-15 HISTORY — PX: NEPHROLITHOTOMY: SHX5134

## 2022-09-15 LAB — GLUCOSE, CAPILLARY
Glucose-Capillary: 89 mg/dL (ref 70–99)
Glucose-Capillary: 97 mg/dL (ref 70–99)

## 2022-09-15 LAB — BASIC METABOLIC PANEL
Anion gap: 10 (ref 5–15)
BUN: 22 mg/dL (ref 8–23)
CO2: 20 mmol/L — ABNORMAL LOW (ref 22–32)
Calcium: 8.5 mg/dL — ABNORMAL LOW (ref 8.9–10.3)
Chloride: 107 mmol/L (ref 98–111)
Creatinine, Ser: 0.37 mg/dL — ABNORMAL LOW (ref 0.44–1.00)
GFR, Estimated: >60 mL/min (ref >60)
Glucose, Bld: 106 mg/dL — ABNORMAL HIGH (ref 70–99)
Potassium: 4.1 mmol/L (ref 3.5–5.1)
Sodium: 137 mmol/L (ref 135–145)

## 2022-09-15 LAB — CBC
HCT: 37.3 % (ref 36.0–46.0)
Hemoglobin: 11.2 g/dL — ABNORMAL LOW (ref 12.0–15.0)
MCH: 27.2 pg (ref 26.0–34.0)
MCHC: 30 g/dL (ref 30.0–36.0)
MCV: 90.5 fL (ref 80.0–100.0)
Platelets: 186 10*3/uL (ref 150–400)
RBC: 4.12 MIL/uL (ref 3.87–5.11)
RDW: 15.2 % (ref 11.5–15.5)
WBC: 7.3 10*3/uL (ref 4.0–10.5)
nRBC: 0 % (ref 0.0–0.2)

## 2022-09-15 SURGERY — NEPHROLITHOTOMY PERCUTANEOUS
Anesthesia: General | Site: Flank | Laterality: Right

## 2022-09-15 MED ORDER — FUROSEMIDE 20 MG PO TABS
20.0000 mg | ORAL_TABLET | Freq: Every day | ORAL | Status: DC
  Administered 2022-09-15 – 2022-09-17 (×3): 20 mg via ORAL
  Filled 2022-09-15 (×3): qty 1

## 2022-09-15 MED ORDER — SODIUM CHLORIDE 0.9 % IR SOLN
Status: DC | PRN
  Administered 2022-09-15 (×6): 3000 mL

## 2022-09-15 MED ORDER — PHENYLEPHRINE 80 MCG/ML (10ML) SYRINGE FOR IV PUSH (FOR BLOOD PRESSURE SUPPORT)
PREFILLED_SYRINGE | INTRAVENOUS | Status: AC
  Filled 2022-09-15: qty 10

## 2022-09-15 MED ORDER — LORATADINE 10 MG PO TABS
10.0000 mg | ORAL_TABLET | Freq: Every day | ORAL | Status: DC
  Administered 2022-09-16 – 2022-09-17 (×2): 10 mg via ORAL
  Filled 2022-09-15 (×2): qty 1

## 2022-09-15 MED ORDER — LISINOPRIL 2.5 MG PO TABS
1.2500 mg | ORAL_TABLET | Freq: Every day | ORAL | Status: DC
  Filled 2022-09-15 (×5): qty 0.5

## 2022-09-15 MED ORDER — OXYBUTYNIN CHLORIDE 5 MG PO TABS: 5.0000 mg | ORAL_TABLET | Freq: Three times a day (TID) | ORAL | Status: DC | PRN

## 2022-09-15 MED ORDER — ZOLPIDEM TARTRATE 5 MG PO TABS
5.0000 mg | ORAL_TABLET | Freq: Every evening | ORAL | Status: DC | PRN
  Administered 2022-09-15: 5 mg via ORAL
  Filled 2022-09-15: qty 1

## 2022-09-15 MED ORDER — ROCURONIUM BROMIDE 10 MG/ML (PF) SYRINGE
PREFILLED_SYRINGE | INTRAVENOUS | Status: AC
  Filled 2022-09-15: qty 10

## 2022-09-15 MED ORDER — POLYVINYL ALCOHOL 1.4 % OP SOLN
1.0000 [drp] | Freq: Two times a day (BID) | OPHTHALMIC | Status: DC
  Administered 2022-09-15 – 2022-09-17 (×4): 1 [drp] via OPHTHALMIC
  Filled 2022-09-15: qty 15

## 2022-09-15 MED ORDER — FENTANYL CITRATE (PF) 100 MCG/2ML IJ SOLN
INTRAMUSCULAR | Status: DC | PRN
  Administered 2022-09-15: 100 ug via INTRAVENOUS
  Administered 2022-09-15 (×2): 25 ug via INTRAVENOUS

## 2022-09-15 MED ORDER — FENTANYL CITRATE (PF) 100 MCG/2ML IJ SOLN
INTRAMUSCULAR | Status: AC
  Filled 2022-09-15: qty 2

## 2022-09-15 MED ORDER — HYDROMORPHONE HCL 1 MG/ML IJ SOLN: 0.2500 mg | INTRAMUSCULAR | Status: DC | PRN

## 2022-09-15 MED ORDER — FERROUS SULFATE 325 (65 FE) MG PO TABS
325.0000 mg | ORAL_TABLET | Freq: Three times a day (TID) | ORAL | Status: DC
  Administered 2022-09-15 – 2022-09-17 (×5): 325 mg via ORAL
  Filled 2022-09-15 (×5): qty 1

## 2022-09-15 MED ORDER — PROPOFOL 10 MG/ML IV BOLUS
INTRAVENOUS | Status: DC | PRN
  Administered 2022-09-15: 180 mg via INTRAVENOUS

## 2022-09-15 MED ORDER — LEVOTHYROXINE SODIUM 100 MCG PO TABS
200.0000 ug | ORAL_TABLET | Freq: Every day | ORAL | Status: DC
  Administered 2022-09-16 – 2022-09-17 (×2): 200 ug via ORAL
  Filled 2022-09-15 (×2): qty 2

## 2022-09-15 MED ORDER — MIDAZOLAM HCL 2 MG/2ML IJ SOLN
INTRAMUSCULAR | Status: AC
  Filled 2022-09-15: qty 2

## 2022-09-15 MED ORDER — CHLORHEXIDINE GLUCONATE 0.12 % MT SOLN
15.0000 mL | Freq: Once | OROMUCOSAL | Status: AC
  Administered 2022-09-15: 15 mL via OROMUCOSAL

## 2022-09-15 MED ORDER — ACETAMINOPHEN 325 MG PO TABS
650.0000 mg | ORAL_TABLET | ORAL | Status: DC | PRN
  Administered 2022-09-15 – 2022-09-17 (×5): 650 mg via ORAL
  Filled 2022-09-15 (×5): qty 2

## 2022-09-15 MED ORDER — DIATRIZOATE MEGLUMINE 30 % UR SOLN
URETHRAL | Status: DC | PRN
  Administered 2022-09-15: 20 mL via URETHRAL

## 2022-09-15 MED ORDER — ONDANSETRON HCL 4 MG/2ML IJ SOLN: 4.0000 mg | Freq: Once | INTRAMUSCULAR | Status: DC | PRN

## 2022-09-15 MED ORDER — DIPHENHYDRAMINE HCL 50 MG/ML IJ SOLN: 12.5000 mg | Freq: Four times a day (QID) | INTRAMUSCULAR | Status: DC | PRN

## 2022-09-15 MED ORDER — DIATRIZOATE MEGLUMINE 30 % UR SOLN
URETHRAL | Status: AC
  Filled 2022-09-15: qty 100

## 2022-09-15 MED ORDER — POTASSIUM CHLORIDE CRYS ER 10 MEQ PO TBCR
10.0000 meq | EXTENDED_RELEASE_TABLET | Freq: Every day | ORAL | Status: DC
  Administered 2022-09-15 – 2022-09-17 (×3): 10 meq via ORAL
  Filled 2022-09-15 (×3): qty 1

## 2022-09-15 MED ORDER — CEFTRIAXONE SODIUM 2 G IJ SOLR
2.0000 g | INTRAMUSCULAR | Status: DC
Start: 2022-09-15 — End: 2022-09-15

## 2022-09-15 MED ORDER — STERILE WATER FOR IRRIGATION IR SOLN
Status: DC | PRN
  Administered 2022-09-15: 1000 mL

## 2022-09-15 MED ORDER — FLUTICASONE PROPIONATE 50 MCG/ACT NA SUSP
2.0000 | Freq: Every day | NASAL | Status: DC
  Administered 2022-09-16 – 2022-09-17 (×2): 2 via NASAL
  Filled 2022-09-15: qty 16

## 2022-09-15 MED ORDER — HYDROMORPHONE HCL 1 MG/ML IJ SOLN: 0.5000 mg | INTRAMUSCULAR | Status: DC | PRN

## 2022-09-15 MED ORDER — ATORVASTATIN CALCIUM 10 MG PO TABS
10.0000 mg | ORAL_TABLET | Freq: Every evening | ORAL | Status: DC
  Administered 2022-09-15 – 2022-09-16 (×2): 10 mg via ORAL
  Filled 2022-09-15 (×2): qty 1

## 2022-09-15 MED ORDER — GENTAMICIN SULFATE 40 MG/ML IJ SOLN
5.0000 mg/kg | Freq: Once | INTRAVENOUS | Status: AC
  Administered 2022-09-15: 390 mg via INTRAVENOUS
  Filled 2022-09-15: qty 9.75

## 2022-09-15 MED ORDER — ONDANSETRON HCL 4 MG/2ML IJ SOLN: 4.0000 mg | INTRAMUSCULAR | Status: DC | PRN

## 2022-09-15 MED ORDER — PHENYLEPHRINE 80 MCG/ML (10ML) SYRINGE FOR IV PUSH (FOR BLOOD PRESSURE SUPPORT)
PREFILLED_SYRINGE | INTRAVENOUS | Status: DC | PRN
  Administered 2022-09-15: 880 ug via INTRAVENOUS

## 2022-09-15 MED ORDER — SENNOSIDES-DOCUSATE SODIUM 8.6-50 MG PO TABS
2.0000 | ORAL_TABLET | Freq: Every day | ORAL | Status: DC
  Administered 2022-09-15 – 2022-09-16 (×2): 2 via ORAL
  Filled 2022-09-15 (×2): qty 2

## 2022-09-15 MED ORDER — AZELASTINE HCL 0.1 % NA SOLN
2.0000 | Freq: Two times a day (BID) | NASAL | Status: DC
  Administered 2022-09-15 – 2022-09-17 (×4): 2 via NASAL
  Filled 2022-09-15: qty 30

## 2022-09-15 MED ORDER — TOPIRAMATE 25 MG PO TABS
50.0000 mg | ORAL_TABLET | Freq: Two times a day (BID) | ORAL | Status: DC
  Administered 2022-09-15 – 2022-09-17 (×4): 50 mg via ORAL
  Filled 2022-09-15 (×4): qty 2

## 2022-09-15 MED ORDER — ONDANSETRON HCL 4 MG/2ML IJ SOLN
INTRAMUSCULAR | Status: AC
  Filled 2022-09-15: qty 2

## 2022-09-15 MED ORDER — LIDOCAINE HCL (CARDIAC) PF 100 MG/5ML IV SOSY
PREFILLED_SYRINGE | INTRAVENOUS | Status: DC | PRN
  Administered 2022-09-15: 80 mg via INTRATRACHEAL

## 2022-09-15 MED ORDER — ONDANSETRON HCL 4 MG/2ML IJ SOLN
INTRAMUSCULAR | Status: DC | PRN
  Administered 2022-09-15: 4 mg via INTRAVENOUS

## 2022-09-15 MED ORDER — MIDAZOLAM HCL 2 MG/2ML IJ SOLN
INTRAMUSCULAR | Status: DC | PRN
  Administered 2022-09-15: 2 mg via INTRAVENOUS

## 2022-09-15 MED ORDER — OXYCODONE HCL 5 MG PO TABS: 5.0000 mg | ORAL_TABLET | ORAL | Status: DC | PRN

## 2022-09-15 MED ORDER — ROCURONIUM BROMIDE 10 MG/ML (PF) SYRINGE
PREFILLED_SYRINGE | INTRAVENOUS | Status: DC | PRN
  Administered 2022-09-15: 60 mg via INTRAVENOUS

## 2022-09-15 MED ORDER — GABAPENTIN 300 MG PO CAPS
300.0000 mg | ORAL_CAPSULE | Freq: Three times a day (TID) | ORAL | Status: DC
  Administered 2022-09-15 – 2022-09-17 (×6): 300 mg via ORAL
  Filled 2022-09-15 (×6): qty 1

## 2022-09-15 MED ORDER — PROPOFOL 10 MG/ML IV BOLUS
INTRAVENOUS | Status: AC
  Filled 2022-09-15: qty 20

## 2022-09-15 MED ORDER — MIRABEGRON ER 25 MG PO TB24
25.0000 mg | ORAL_TABLET | Freq: Every day | ORAL | Status: DC
  Administered 2022-09-16 – 2022-09-17 (×2): 25 mg via ORAL
  Filled 2022-09-15 (×2): qty 1

## 2022-09-15 MED ORDER — DIPHENHYDRAMINE HCL 12.5 MG/5ML PO ELIX: 12.5000 mg | ORAL_SOLUTION | Freq: Four times a day (QID) | ORAL | Status: DC | PRN

## 2022-09-15 MED ORDER — IPRATROPIUM-ALBUTEROL 0.5-2.5 (3) MG/3ML IN SOLN: 3.0000 mL | Freq: Four times a day (QID) | RESPIRATORY_TRACT | Status: DC | PRN

## 2022-09-15 MED ORDER — LIDOCAINE HCL (PF) 2 % IJ SOLN
INTRAMUSCULAR | Status: AC
  Filled 2022-09-15: qty 5

## 2022-09-15 MED ORDER — EZETIMIBE 10 MG PO TABS
10.0000 mg | ORAL_TABLET | Freq: Every day | ORAL | Status: DC
  Administered 2022-09-15 – 2022-09-16 (×2): 10 mg via ORAL
  Filled 2022-09-15 (×2): qty 1

## 2022-09-15 MED ORDER — CARVEDILOL 3.125 MG PO TABS
6.2500 mg | ORAL_TABLET | Freq: Every day | ORAL | Status: DC
  Administered 2022-09-16: 6.25 mg via ORAL
  Filled 2022-09-15 (×2): qty 2

## 2022-09-15 MED ORDER — ORAL CARE MOUTH RINSE: 15.0000 mL | Freq: Once | OROMUCOSAL | Status: AC

## 2022-09-15 MED ORDER — SODIUM CHLORIDE 0.9 % IV SOLN: INTRAVENOUS | Status: DC

## 2022-09-15 MED ORDER — SUGAMMADEX SODIUM 200 MG/2ML IV SOLN
INTRAVENOUS | Status: DC | PRN
  Administered 2022-09-15: 200 mg via INTRAVENOUS

## 2022-09-15 MED ORDER — FAMOTIDINE 20 MG PO TABS
20.0000 mg | ORAL_TABLET | Freq: Two times a day (BID) | ORAL | Status: DC
  Administered 2022-09-15 – 2022-09-17 (×4): 20 mg via ORAL
  Filled 2022-09-15 (×4): qty 1

## 2022-09-15 MED ORDER — LACTATED RINGERS IV SOLN: INTRAVENOUS | Status: DC

## 2022-09-15 SURGICAL SUPPLY — 50 items
APL PRP STRL LF DISP 70% ISPRP (MISCELLANEOUS) ×2
APL SKNCLS STERI-STRIP NONHPOA (GAUZE/BANDAGES/DRESSINGS) ×4
BAG DRN RND TRDRP ANRFLXCHMBR (UROLOGICAL SUPPLIES) ×2
BAG URINE DRAIN 2000ML AR STRL (UROLOGICAL SUPPLIES) ×3 IMPLANT
BASKET ZERO TIP NITINOL 2.4FR (BASKET) ×1 IMPLANT
BENZOIN TINCTURE PRP APPL 2/3 (GAUZE/BANDAGES/DRESSINGS) ×6 IMPLANT
BLADE SURG 15 STRL LF DISP TIS (BLADE) ×3 IMPLANT
BLADE SURG 15 STRL SS (BLADE) ×2
BSKT STON RTRVL ZERO TP 2.4FR (BASKET) ×2
CATCHER STONE W/TUBE ADAPTER (UROLOGICAL SUPPLIES) ×2 IMPLANT
CATH FOLEY 2WAY SLVR 5CC 20FR (CATHETERS) ×1 IMPLANT
CATH INTERMIT  6FR 70CM (CATHETERS) ×3 IMPLANT
CATH UROLOGY TORQUE 65 (CATHETERS) ×3 IMPLANT
CATH X-FORCE N30 NEPHROSTOMY (TUBING) ×3 IMPLANT
CHLORAPREP W/TINT 26 (MISCELLANEOUS) ×1 IMPLANT
COVER LIGHT HANDLE STERIS (MISCELLANEOUS) ×6 IMPLANT
DRAPE C-ARM FOLDED MOBILE STRL (DRAPES) ×3 IMPLANT
DRAPE HALF SHEET 40X57 (DRAPES) ×3 IMPLANT
DRAPE LINGEMAN PERC (DRAPES) ×3 IMPLANT
DRSG PAD ABDOMINAL 8X10 ST (GAUZE/BANDAGES/DRESSINGS) ×6 IMPLANT
DRSG TEGADERM 8X12 (GAUZE/BANDAGES/DRESSINGS) ×9 IMPLANT
GAUZE SPONGE 4X4 12PLY STRL (GAUZE/BANDAGES/DRESSINGS) ×3 IMPLANT
GLOVE BIO SURGEON STRL SZ8 (GLOVE) ×3 IMPLANT
GLOVE BIOGEL PI IND STRL 7.0 (GLOVE) ×6 IMPLANT
GLOVE BIOGEL PI IND STRL 8 (GLOVE) IMPLANT
GOWN STRL REUS W/TWL LRG LVL3 (GOWN DISPOSABLE) ×3 IMPLANT
GOWN STRL REUS W/TWL XL LVL3 (GOWN DISPOSABLE) ×3 IMPLANT
GUIDEWIRE AMPLAZ .035X145 (WIRE) ×3 IMPLANT
GUIDEWIRE STR DUAL SENSOR (WIRE) ×3 IMPLANT
IV NS IRRIG 3000ML ARTHROMATIC (IV SOLUTION) ×10 IMPLANT
KIT BLADEGUARD II DBL (SET/KITS/TRAYS/PACK) ×3 IMPLANT
KIT PROBE TRILOGY 3.9X350 (MISCELLANEOUS) ×3 IMPLANT
KIT TURNOVER KIT A (KITS) ×3 IMPLANT
MANIFOLD NEPTUNE II (INSTRUMENTS) ×3 IMPLANT
PACK CYSTO (CUSTOM PROCEDURE TRAY) ×3 IMPLANT
PAD ABD 5X9 TENDERSORB (GAUZE/BANDAGES/DRESSINGS) ×1 IMPLANT
PAD ABD 8X10 STRL (GAUZE/BANDAGES/DRESSINGS) ×2 IMPLANT
PAD ARMBOARD 7.5X6 YLW CONV (MISCELLANEOUS) ×3 IMPLANT
POSITIONER HEAD PRONE TRACH (MISCELLANEOUS) ×3 IMPLANT
PROBE PNEUMATIC 1.0MMX570MM (UROLOGICAL SUPPLIES) IMPLANT
SET AMPLATZ RENAL DILATOR (MISCELLANEOUS) IMPLANT
SET BASIN LINEN APH (SET/KITS/TRAYS/PACK) ×3 IMPLANT
SHEATH COOK PEEL AWAY SET 9F (SHEATH) ×1 IMPLANT
SHEATH PEELAWAY SET 9 (SHEATH) ×3 IMPLANT
STENT URET 6FRX26 CONTOUR (STENTS) ×1 IMPLANT
STONE CATCHER W/TUBE ADAPTER (UROLOGICAL SUPPLIES) ×2 IMPLANT
SUT SILK 2 0 SH (SUTURE) ×3 IMPLANT
SYR 50ML LL SCALE MARK (SYRINGE) ×3 IMPLANT
WATER STERILE IRR 1000ML POUR (IV SOLUTION) ×3 IMPLANT
YANKAUER SUCT BULB TIP 10FT TU (MISCELLANEOUS) ×6 IMPLANT

## 2022-09-15 NOTE — Anesthesia Procedure Notes (Addendum)
Procedure Name: Intubation Date/Time: 09/15/2022 11:51 AM  Performed by: Oletha Cruel, CRNAPre-anesthesia Checklist: Patient identified, Emergency Drugs available, Suction available, Patient being monitored and Timeout performed Patient Re-evaluated:Patient Re-evaluated prior to induction Preoxygenation: Pre-oxygenation with 100% oxygen Induction Type: IV induction Ventilation: Mask ventilation without difficulty Laryngoscope Size: Mac and 4 Grade View: Grade II Tube type: Oral Number of attempts: 1 Airway Equipment and Method: Stylet Placement Confirmation: ETT inserted through vocal cords under direct vision, positive ETCO2 and breath sounds checked- equal and bilateral Secured at: 21 cm Tube secured with: Tape Dental Injury: Teeth and Oropharynx as per pre-operative assessment  Comments: Patient edentulous.

## 2022-09-15 NOTE — Anesthesia Postprocedure Evaluation (Signed)
Anesthesia Post Note  Patient: [name]  Procedure(s) Performed: NEPHROLITHOTOMY PERCUTANEOUS- already has tube (Right: Flank)  Patient location during evaluation: PACU Anesthesia Type: General Level of consciousness: awake and alert and oriented Pain management: pain level controlled Vital Signs Assessment: post-procedure vital signs reviewed and stable Respiratory status: spontaneous breathing, nonlabored ventilation and respiratory function stable Cardiovascular status: blood pressure returned to baseline and stable Postop Assessment: no apparent nausea or vomiting Anesthetic complications: no  No notable events documented.   Last Vitals:  Vitals:   09/15/22 1415 09/15/22 1430  BP: 127/66 127/66  Pulse: (!) 59 61  Resp: (!) 9 (!) 8  Temp:    SpO2: 100% 100%    Last Pain:  Vitals:   09/15/22 1415  PainSc: 0-No pain                 Salem Lembke C Yaritza Leist

## 2022-09-15 NOTE — Transfer of Care (Signed)
Immediate Anesthesia Transfer of Care Note  Patient: [name]  Procedure(s) Performed: NEPHROLITHOTOMY PERCUTANEOUS- already has tube (Right: Flank)  Patient Location: PACU  Anesthesia Type:General  Level of Consciousness: awake and patient cooperative  Airway & Oxygen Therapy: Patient Spontanous Breathing and Patient connected to face mask oxygen  Post-op Assessment: Report given to RN and Post -op Vital signs reviewed and stable  Post vital signs: Reviewed and stable  Last Vitals:  Vitals Value Taken Time  BP 130/66 09/15/22 1335  Temp    Pulse 71 09/15/22 1341  Resp 15 09/15/22 1341  SpO2 100 % 09/15/22 1341  Vitals shown include unvalidated device data.  Last Pain:  Vitals:   09/15/22 1030  PainSc: 4       Patients Stated Pain Goal: 5 (09/15/22 1030)  Complications: No notable events documented.

## 2022-09-15 NOTE — Anesthesia Preprocedure Evaluation (Signed)
Anesthesia Evaluation  Patient identified by MRN, date of birth, ID band Patient awake    Reviewed: Allergy & Precautions, H&P , NPO status , Patient's Chart, lab work & pertinent test results  Airway Mallampati: II  TM Distance: >3 FB Neck ROM: Full    Dental  (+) Edentulous Upper, Missing, Dental Advisory Given   Pulmonary shortness of breath and with exertion, asthma , COPD, former smoker   Pulmonary exam normal breath sounds clear to auscultation       Cardiovascular METS (paraplegic): hypertension, Pt. on medications Normal cardiovascular exam Rhythm:Regular Rate:Normal     Neuro/Psych  PSYCHIATRIC DISORDERS Anxiety Depression Bipolar Disorder    Neuromuscular disease (paraplegic)    GI/Hepatic Neg liver ROS,GERD  Medicated and Controlled,,  Endo/Other  diabetes, Well Controlled, Type 2Hypothyroidism    Renal/GU Renal disease (stones)  negative genitourinary   Musculoskeletal  (+) Arthritis , Osteoarthritis,    Abdominal   Peds negative pediatric ROS (+)  Hematology negative hematology ROS (+)   Anesthesia Other Findings   Reproductive/Obstetrics negative OB ROS                              Anesthesia Physical Anesthesia Plan  ASA: 3  Anesthesia Plan: General   Post-op Pain Management: Dilaudid IV   Induction: Intravenous  PONV Risk Score and Plan: 4 or greater and Ondansetron, Dexamethasone and Metaclopromide  Airway Management Planned: Oral ETT  Additional Equipment:   Intra-op Plan:   Post-operative Plan: Extubation in OR  Informed Consent: I have reviewed the patients History and Physical, chart, labs and discussed the procedure including the risks, benefits and alternatives for the proposed anesthesia with the patient or authorized representative who has indicated his/her understanding and acceptance.     Dental advisory given  Plan Discussed with: CRNA and  Surgeon  Anesthesia Plan Comments:         Anesthesia Quick Evaluation

## 2022-09-15 NOTE — Op Note (Signed)
Preoperative diagnosis: Right renal stone  Postoperative diagnosis: Same  Procedure 1.  Right percutaneous nephrostolithotomy for stone greater than 2 cm 2.  Right nephrostogram 3.  Intraoperative fluoroscopy, under 1 hour, with interpretation 4.  Placement of a 6 x 26 double-J ureteral stent. 5.  Placement of a 20 French nephrostomy tube 6.  Dilation of percutaneous tract  Attending: Wilkie Aye, MD  Anesthesia: General  Estimated blood loss: Minimal  Antibiotics: Zosyn  Drains: 1.  16 French Foley catheter 2.  6 x 26 right double-J ureteral stent 3.  20 French nephrostomy tube  Specimens: Stone for analysis  Findings: numerous renal pelvis calculi. 4cm UPJ calculus. Limited drainage of contrast down the ureter prior to removing UPJ calculus. Minimal extravasation following PCNL  Indications: Patient is a 77 year old female with a history of large right renal stones.  After discussing treatment options and decided she was right percutaneous nephrostolithotomy.  Patient already has a nephrostomy tube.  Procedure in detail: Prior to procedure consent was obtained.  Patient was brought to the operating room debridement was done to ensure correct patient, correct procedure, and correct site.  General anesthesia was administered.  A 16 French Foley catheter was in place.  The patient was then placed in the prone position.  His nephrostomy tube and right flank was then prepped and draped in usual sterile fashion.  A nephrostogram was obtained and findings noted above.  Through the nephrostomy tube we then placed a sensor wire.  Sensor wire was coiled in the renal pelvis we then removed the nephrostomy tube.  We then made an incision at the level of the skin and over the wire we then placed a NephroMax dilator.  We dilated the nephrostomy tract to 30 [place] and held this 18 cm of water for 1 minute.  We then  placed the access sheath over the balloon.  The balloon was then deflated.  We  then used a rigid nephroscope to perform nephroscopy.  We encountered numerous large renal pelvis calculi and large UPJ calculus.  We then used a lithoclast to fragment the stone in multiple pieces.  Using the graspers we removed stone fragments and sent for composition analysis.  Once the majority of the stone was removed we then were able to perform nephroscopy with a flexible nephroscope.   We then placed a second wire through the ureteroscope into the bladder.  We then removed the nephroscope and over the wire placed a 6 x 26 double-J ureteral stent.  The wire was then removed and good coil was noted in the renal pelvis under direct vision in the bladder under fluoroscopy.  We then placed a 20 French nephrostomy tube through the sheath into the renal pelvis.  The balloon was inflated with 3 amounts of contrast.  We then removed the access sheath in and obtain another nephrostogram.  We noted minimal extravasation of contrast.  We then secured the nephrostomy tubes with 0 silks in interrupted fashion.  Dressing was placed over the nephrostomy tube site and this then concluded the procedure was well-tolerated by the patient.  Complications: None  Condition: Stable, extubated, transferred to PACU  Plan: Patient is to be admitted overnight for observation.  His Foley catheter through the morning. Nephrostomy tube will be removed in 2 days. She is then to be discharged home and followup in 1 week for stent removal

## 2022-09-15 NOTE — H&P (Signed)
[name] is a 76yo here for right percutaneous nephrostolithotomy. She has 2 right ureteral calculus and a large right UPJ calculus. She is confined to a stretcher. He nephrostomy tube was last changed 1 month ago Her right UPJ calculus is 3.7cm.      PMH:     Past Medical History:  Diagnosis Date   Bipolar 1 disorder (HCC)     COPD with asthma     Diabetes mellitus without complication (HCC)     GERD (gastroesophageal reflux disease)     Hypertension     Obesity     Paraplegic spinal paralysis (HCC)     Pyelonephritis     Thyroid disease        Surgical History:      Past Surgical History:  Procedure Laterality Date   ABDOMINAL HYSTERECTOMY       BACK SURGERY       IR NEPHROSTOMY EXCHANGE LEFT   02/16/2022   IR NEPHROSTOMY EXCHANGE RIGHT   03/10/2022   IR NEPHROSTOMY PLACEMENT RIGHT   12/24/2021   IR NEPHROURETERAL CATH PLACE RIGHT   03/25/2022      Home Medications:  Allergies as of 05/27/2022         Reactions    Codeine Nausea Only    Listed on MAR    Invanz [ertapenem] Other (See Comments)    Patient states allergy but is not certain     Propranolol Hcl      Listed on MAR    Rocephin [ceftriaxone] Hives    Vancomycin Hives            Medication List           Accurate as of May 27, 2022 11:39 AM. If you have any questions, ask your nurse or doctor.              acetaminophen 325 MG tablet Commonly known as: TYLENOL Take 1 tablet (325 mg total) by mouth every 6 (six) hours as needed for mild pain, moderate pain, fever or headache. *May take one every 6 hours as needed for pain What changed:  how much to take additional instructions    ACIDOPHILUS PO Take 1 tablet by mouth in the morning and at bedtime.    Arginaid Pack Take 1 packet by mouth daily.    ascorbic acid 500 MG tablet Commonly known as: VITAMIN C Take 500 mg by mouth 2 (two) times daily.    atorvastatin 10 MG tablet Commonly known as: LIPITOR Take 10 mg by mouth every evening.     azelastine 0.1 % nasal spray Commonly known as: ASTELIN Place 2 sprays into both nostrils 2 (two) times daily. Use in each nostril as directed    b complex vitamins tablet Take 1 tablet by mouth daily.    CALCIUM CARBONATE-VITAMIN D3 PO Take 1 tablet by mouth in the morning and at bedtime.    carvedilol 6.25 MG tablet Commonly known as: COREG Take 6.25 mg by mouth 2 (two) times daily. Hold for SBP <100 or HR < 60    CERTAGEN PO Take 1 tablet by mouth daily.    Cranberry 500 MG Tabs Take 1 capsule by mouth daily.    dextromethorphan-guaiFENesin 30-600 MG 12hr tablet Commonly known as: MUCINEX DM Take 1 tablet by mouth every 12 (twelve) hours as needed for cough.    docusate sodium 100 MG capsule Commonly known as: COLACE Take 200 mg by mouth at bedtime.    ezetimibe 10  MG tablet Commonly known as: ZETIA Take 10 mg by mouth at bedtime.    famotidine 20 MG tablet Commonly known as: PEPCID Take 20 mg by mouth 2 (two) times daily.    feeding supplement (PRO-STAT 64) Liqd Take 30 mLs by mouth 3 (three) times daily with meals.    ferrous sulfate 325 (65 FE) MG EC tablet Take 325 mg by mouth 3 (three) times daily with meals.    fluticasone 50 MCG/ACT nasal spray Commonly known as: FLONASE Place 2 sprays into both nostrils daily.    gabapentin 300 MG capsule Commonly known as: NEURONTIN Take 300 mg by mouth every 8 (eight) hours.    ipratropium-albuterol 0.5-2.5 (3) MG/3ML Soln Commonly known as: DUONEB Take 3 mLs by nebulization every 6 (six) hours as needed (shortness of breath).    levothyroxine 150 MCG tablet Commonly known as: SYNTHROID Take 150 mcg by mouth daily.    lisinopril 2.5 MG tablet Commonly known as: ZESTRIL Take 1.25 mg by mouth daily.    midodrine 5 MG tablet Commonly known as: PROAMATINE Take 1 tablet (5 mg total) by mouth 3 (three) times daily with meals. What changed: when to take this    mirabegron ER 25 MG Tb24 tablet Commonly known  as: MYRBETRIQ Take 25 mg by mouth daily.    OcuSoft Eyelid Cleansing Pads Apply 1 Application topically daily.    Pataday 0.1 % ophthalmic solution Generic drug: olopatadine Place 1 drop into both eyes every 6 (six) hours as needed for allergies.    polyethylene glycol 17 g packet Commonly known as: MIRALAX / GLYCOLAX Take 17 g by mouth daily as needed for moderate constipation.    promethazine 12.5 MG suppository Commonly known as: PHENERGAN Place 12.5 mg rectally every 12 (twelve) hours as needed for nausea or vomiting.    rizatriptan 10 MG tablet Commonly known as: MAXALT Take 10 mg by mouth daily as needed for migraine.    tamsulosin 0.4 MG Caps capsule Commonly known as: FLOMAX Take 1 capsule (0.4 mg total) by mouth daily after supper.    topiramate 50 MG tablet Commonly known as: TOPAMAX Take 50 mg by mouth 2 (two) times daily.    Vitamin D (Ergocalciferol) 1.25 MG (50000 UNIT) Caps capsule Commonly known as: DRISDOL Take 50,000 Units by mouth every Saturday.             Allergies:       Allergies  Allergen Reactions   Codeine Nausea Only      Listed on MAR   Invanz [Ertapenem] Other (See Comments)      Patient states allergy but is not certain    Propranolol Hcl        Listed on MAR   Rocephin [Ceftriaxone] Hives   Vancomycin Hives      Family History:      Family History  Adopted: Yes  Problem Relation Age of Onset   Heart disease Mother        Social History:  reports that she has quit smoking. She has never used smokeless tobacco. She reports that she does not currently use alcohol. She reports that she does not use drugs.   ROS: All other review of systems were reviewed and are negative except what is noted above in HPI   Physical Exam: BP 101/70   Pulse 78   Constitutional:  Alert and oriented, No acute distress. HEENT: [place] AT, moist mucus membranes.  Trachea midline, no masses. Cardiovascular: No clubbing, cyanosis, or  edema. Respiratory: Normal respiratory effort, no increased work of breathing. GI: Abdomen is soft, nontender, nondistended, no abdominal masses GU: No CVA tenderness.  Lymph: No cervical or inguinal lymphadenopathy. Skin: No rashes, bruises or suspicious lesions. Neurologic: Grossly intact, no focal deficits, moving all 4 extremities. Psychiatric: Normal mood and affect.   Laboratory Data: Recent Labs       Lab Results  Component Value Date    WBC 5.9 03/25/2022    HGB 10.9 (L) 03/25/2022    HCT 35.3 (L) 03/25/2022    MCV 90.7 03/25/2022    PLT 250 03/25/2022        Recent Labs       Lab Results  Component Value Date    CREATININE 0.46 03/09/2022        Recent Labs  No results found for: "PSA"     Recent Labs  No results found for: "TESTOSTERONE"     Recent Labs       Lab Results  Component Value Date    HGBA1C 6.0 (H) 11/15/2021        Urinalysis Labs (Brief)          Component Value Date/Time    COLORURINE YELLOW 12/24/2021 0244    APPEARANCEUR TURBID (A) 12/24/2021 0244    APPEARANCEUR Cloudy (A) 02/23/2021 1343    LABSPEC 1.016 12/24/2021 0244    PHURINE 5.0 12/24/2021 0244    GLUCOSEU NEGATIVE 12/24/2021 0244    HGBUR SMALL (A) 12/24/2021 0244    BILIRUBINUR NEGATIVE 12/24/2021 0244    BILIRUBINUR Negative 02/23/2021 1343    KETONESUR 5 (A) 12/24/2021 0244    PROTEINUR 100 (A) 12/24/2021 0244    UROBILINOGEN 0.2 09/08/2014 0436    NITRITE NEGATIVE 12/24/2021 0244    LEUKOCYTESUR LARGE (A) 12/24/2021 0244        Recent Labs       Lab Results  Component Value Date    LABMICR See below: 02/23/2021    WBCUA 11-30 (A) 02/23/2021    LABEPIT 0-10 02/23/2021    MUCUS Present 02/23/2021    BACTERIA MANY (A) 12/24/2021        Pertinent Imaging: CT 02/23/2022: Images reviewed and discussed with the patient  No results found for this or any previous visit.   No results found for this or any previous visit.   No results found for this or  any previous visit.   No results found for this or any previous visit.   No results found for this or any previous visit.   No valid procedures specified. No results found for this or any previous visit.   Results for orders placed during the hospital encounter of 02/23/22   CT Renal Stone Study   Narrative CLINICAL DATA:  Nephrostomy tube catheter pulled out about 3 inches in a patient with large renal calculus.   EXAM: CT ABDOMEN AND PELVIS WITHOUT CONTRAST   TECHNIQUE: Multidetector CT imaging of the abdomen and pelvis was performed following the standard protocol without IV contrast.   RADIATION DOSE REDUCTION: This exam was performed according to the departmental dose-optimization program which includes automated exposure control, adjustment of the mA and/or kV according to patient size and/or use of iterative reconstruction technique.   COMPARISON:  January 31, 2022   FINDINGS: Lower chest: Slightly improved aeration at the RIGHT lung base still with patchy ground-glass nodularity, volume loss and consolidative changes. These are mild and improved since previous imaging. No pleural effusion. Mild LEFT basilar atelectasis.  Hepatobiliary: Nodular hepatic contour with signs of fissural widening. Sludge in the gallbladder. No pericholecystic stranding. No gross biliary duct dilation.   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 No sign of pancreatic inflammation.   Spleen: Mild splenomegaly unchanged.   Adrenals/Urinary Tract: Adrenal glands are normal.   Marked perinephric stranding. Nephrostomy tube in place with marker for sideholes within the parenchyma of the interpolar RIGHT kidney, not extending beyond the renal parenchyma. Marker for sideholes with similar appearance in terms of positioning compared to previous imaging. Pigtail portion is coiled within lower pole collecting system elements similar to previous imaging. Large branch type calculus  with lamellated appearance extends into the UPJ from the renal sinus measuring 3.7 x 2.7 cm similar to prior imaging. Renal cysts and marked RIGHT renal cortical atrophy are similar to prior imaging as well.   LEFT renal lesions are unchanged including an intermediate density lesion arising from the lower pole measuring approximately 1.7 cm, this area shown to represent a Bosniak category II cyst on prior imaging. LEFT renal cysts are stable. No dedicated follow-up imaging is suggested for renal cysts.   Urinary bladder is collapsed with Foley catheter in place. There is no hydronephrosis. Distal RIGHT ureteral calculi with similar appearance, largest measuring 11 mm just proximal to the RIGHT UVJ. Two additional calculi present in the RIGHT ureter. The largest 11 mm calculus may have progressed slightly towards the UVJ since previous imaging.   Stomach/Bowel: Signs of fecal impaction with large burden of stool in the rectum which shows wall thickening despite rectal distension. Perirectal stranding is similar to previous imaging. Perianal stranding also present. Rectum distended up to 9.5 cm. Slightly increased stool in the distal sigmoid. No signs of overt obstruction of upstream loops of bowel currently. Stomach under distended. No signs of small bowel dilation.   Vascular/Lymphatic:   Aortic atherosclerosis. No sign of aneurysm. Smooth contour of the IVC. There is no gastrohepatic or hepatoduodenal ligament lymphadenopathy. No retroperitoneal or mesenteric lymphadenopathy.   No pelvic sidewall lymphadenopathy.   Atherosclerotic changes are moderate to marked.   Reproductive: Post hysterectomy.   Other: No free air or ascites.  No pneumatosis.   Musculoskeletal: Destructive changes about L2-3 with chronic appearance. Levo convexity of the spine and degenerative changes. Marked muscular atrophy.   Stage IV decubitus ulceration overlying the RIGHT ischium with sclerotic  appearing ischial tuberosity suggesting chronic osteomyelitis. Small amount of gas tracking posterior to the acetabulum and along the RIGHT sacro tuberous ligament. Fluid containing tract extending along the structures previously. Gas in continuity with the skin ulceration and perhaps with some packing material in the wound.   Adjacent perianal thickening/stranding in the setting of suspected fecal impaction. No well-formed fluid collection.   IMPRESSION: 1. Nephrostomy tube remaining in lower pole collecting systems of the RIGHT kidney, no signs of hydronephrosis with similar appearance of large staghorn calculus and overall similar appearance of distal ureteral calculi, perhaps mild distal migration of the largest calculus. No hydronephrosis. 2. Signs of stercoral proctitis and colitis due to fecal impaction similar to previous imaging. 3. Decubitus ulceration with local extension of fluid in gas along the acetabulum and sacrotuberous ligament. Fluid in these areas previously but no well-formed collection. Findings favored to represent gas tracking from the open wound into these areas but would suggest close attention on follow-up and correlation with direct clinical inspection to ensure no worsening and development of more aggressive soft tissue infection. 4. Aortic atherosclerosis. 5. Nodular hepatic contours raising the  question of background liver disease. 6. Contralateral LEFT nephrolithiasis.   Aortic Atherosclerosis (ICD10-I70.0).     Electronically Signed By: Donzetta Kohut M.D. On: 02/23/2022 14:46     Assessment & Plan:     1. Nephrolithiasis -We discussed the management of kidney stones. These options include observation, ureteroscopy, shockwave lithotripsy (ESWL) and percutaneous nephrolithotomy (PCNL). We discussed which options are relevant to the patient's stone(s). We discussed the natural history of kidney stones as well as the complications of untreated  stones and the impact on quality of life without treatment as well as with each of the above listed treatments. We also discussed the efficacy of each treatment in its ability to clear the stone burden. With any of these management options I discussed the signs and symptoms of infection and the need for emergent treatment should these be experienced. For each option we discussed the ability of each procedure to clear the patient of their stone burden.   For observation I described the risks which include but are not limited to silent renal damage, life-threatening infection, need for emergent surgery, failure to pass stone and pain.   For ureteroscopy I described the risks which include bleeding, infection, damage to contiguous structures, positioning injury, ureteral stricture, ureteral avulsion, ureteral injury, need for prolonged ureteral stent, inability to perform ureteroscopy, need for an interval procedure, inability to clear stone burden, stent discomfort/pain, heart attack, stroke, pulmonary embolus and the inherent risks with general anesthesia.   For shockwave lithotripsy I described the risks which include arrhythmia, kidney contusion, kidney hemorrhage, need for transfusion, pain, inability to adequately break up stone, inability to pass stone fragments, Steinstrasse, infection associated with obstructing stones, need for alternate surgical procedure, need for repeat shockwave lithotripsy, MI, CVA, PE and the inherent risks with anesthesia/conscious sedation.   For PCNL I described the risks including positioning injury, pneumothorax, hydrothorax, need for chest tube, inability to clear stone burden, renal laceration, arterial venous fistula or malformation, need for embolization of kidney, loss of kidney or renal function, need for repeat procedure, need for prolonged nephrostomy tube, ureteral avulsion, MI, CVA, PE and the inherent risks of general anesthesia.   - The patient would like to  proceed with right PCNL

## 2022-09-16 DIAGNOSIS — N200 Calculus of kidney: Secondary | ICD-10-CM | POA: Diagnosis not present

## 2022-09-16 LAB — CBC
HCT: 35.6 % — ABNORMAL LOW (ref 36.0–46.0)
Hemoglobin: 10.8 g/dL — ABNORMAL LOW (ref 12.0–15.0)
MCH: 27 pg (ref 26.0–34.0)
MCHC: 30.3 g/dL (ref 30.0–36.0)
MCV: 89 fL (ref 80.0–100.0)
Platelets: 213 10*3/uL (ref 150–400)
RBC: 4 MIL/uL (ref 3.87–5.11)
RDW: 15.3 % (ref 11.5–15.5)
WBC: 9 10*3/uL (ref 4.0–10.5)
nRBC: 0 % (ref 0.0–0.2)

## 2022-09-16 LAB — BASIC METABOLIC PANEL
Anion gap: 9 (ref 5–15)
BUN: 23 mg/dL (ref 8–23)
CO2: 22 mmol/L (ref 22–32)
Calcium: 8.4 mg/dL — ABNORMAL LOW (ref 8.9–10.3)
Chloride: 106 mmol/L (ref 98–111)
Creatinine, Ser: 0.47 mg/dL (ref 0.44–1.00)
GFR, Estimated: >60 mL/min (ref >60)
Glucose, Bld: 122 mg/dL — ABNORMAL HIGH (ref 70–99)
Potassium: 4.1 mmol/L (ref 3.5–5.1)
Sodium: 137 mmol/L (ref 135–145)

## 2022-09-16 MED ORDER — GENTAMICIN SULFATE 40 MG/ML IJ SOLN
5.0000 mg/kg | Freq: Once | INTRAVENOUS | Status: AC
  Administered 2022-09-16: 390 mg via INTRAVENOUS
  Filled 2022-09-16: qty 9.75

## 2022-09-16 MED ORDER — SULFAMETHOXAZOLE-TRIMETHOPRIM 800-160 MG PO TABS: 1.0000 | ORAL_TABLET | Freq: Two times a day (BID) | ORAL | 0 refills | Status: AC

## 2022-09-16 MED ORDER — OXYCODONE HCL 5 MG PO TABS: 5.0000 mg | ORAL_TABLET | ORAL | 0 refills | Status: DC | PRN

## 2022-09-16 MED ORDER — CHLORHEXIDINE GLUCONATE CLOTH 2 % EX PADS
6.0000 | MEDICATED_PAD | Freq: Every day | CUTANEOUS | Status: DC
  Administered 2022-09-16 – 2022-09-17 (×2): 6 via TOPICAL

## 2022-09-16 NOTE — Discharge Summary (Signed)
Physician Discharge Summary  Patient ID: [name] MRN: [id number] DOB/AGE: 08/14/1945 77 y.o.  Admit date: 09/15/2022 Discharge date: 09/16/2022  Admission Diagnoses:  Nephrolithiasis  Discharge Diagnoses:  Principal Problem:   Nephrolithiasis Active Problems:   Renal calculus, right   Past Medical History:  Diagnosis Date   Arthritis    Bipolar 1 disorder (HCC)    COPD with asthma    Diabetes mellitus without complication (HCC)    Dyspnea    GERD (gastroesophageal reflux disease)    Hypertension    Obesity    Osteoarthritis    Paraplegic spinal paralysis (HCC)    waist down   Pyelonephritis    Thyroid disease     Surgeries: Procedure(s): NEPHROLITHOTOMY PERCUTANEOUS- already has tube on 09/15/2022   Consultants (if any):   Discharged Condition: Improved  Hospital Course: [name] is an 77 y.o. female who was admitted 09/15/2022 with a diagnosis of Nephrolithiasis and went to the operating room on 09/15/2022 and underwent the above named procedures.    She was given perioperative antibiotics:  Anti-infectives (From admission, onward)    Start     Dose/Rate Route Frequency Ordered Stop   09/16/22 1200  gentamicin (GARAMYCIN) 390 mg in dextrose 5 % 100 mL IVPB        5 mg/kg  77.8 kg (Adjusted) 109.8 mL/hr over 60 Minutes Intravenous  Once 09/16/22 0804     09/16/22 0000  sulfamethoxazole-trimethoprim (BACTRIM DS) 800-160 MG tablet        1 tablet Oral 2 times daily 09/16/22 1217     09/15/22 1045  gentamicin (GARAMYCIN) 390 mg in dextrose 5 % 100 mL IVPB        5 mg/kg  77.8 kg (Adjusted) 109.8 mL/hr over 60 Minutes Intravenous  Once 09/15/22 1044 09/15/22 1224   09/15/22 1011  cefTRIAXone (ROCEPHIN) 2 g in sodium chloride 0.9 % 100 mL IVPB  Status:  Discontinued        2 g 200 mL/hr over 30 Minutes Intravenous 30 min pre-op 09/15/22 1011 09/15/22 1042     .  She was given sequential compression devices for DVT prophylaxis.  She benefited  maximally from the hospital stay and there were no complications.    Recent vital signs:  Vitals:   09/16/22 0648 09/16/22 0934  BP:  106/62  Pulse:  82  Resp:  18  Temp: 99.5 F (37.5 C) 98.6 F (37 C)  SpO2:  95%    Recent laboratory studies:  Lab Results  Component Value Date   HGB 10.8 (L) 09/16/2022   HGB 11.2 (L) 09/15/2022   HGB 11.8 (L) 09/12/2022   Lab Results  Component Value Date   WBC 9.0 09/16/2022   PLT 213 09/16/2022   Lab Results  Component Value Date   INR 1.2 06/17/2022   Lab Results  Component Value Date   NA 137 09/16/2022   K 4.1 09/16/2022   CL 106 09/16/2022   CO2 22 09/16/2022   BUN 23 09/16/2022   CREATININE 0.47 09/16/2022   GLUCOSE 122 (H) 09/16/2022    Discharge Medications:   Allergies as of 09/16/2022       Reactions   Codeine Nausea Only, Other (See Comments)   "Allergic," per facility's paperwork   Invanz [ertapenem] Other (See Comments)   "Allergic," per facility's paperwork   Propranolol Hcl Other (See Comments)   "Allergic," per facility's paperwork   Rocephin [ceftriaxone] Hives, Other (See Comments)   "Allergic," per facility's  paperwork   Vancomycin Hives, Other (See Comments)   "Allergic," per facility's paperwork        Medication List     TAKE these medications    acetaminophen 325 MG tablet Commonly known as: TYLENOL Take 1 tablet (325 mg total) by mouth every 6 (six) hours as needed for mild pain, moderate pain, fever or headache. *May take one every 6 hours as needed for pain What changed:  how much to take when to take this additional instructions   Arginaid Pack Take 1 packet by mouth daily.   Artificial Tears 1.4 % ophthalmic solution Generic drug: polyvinyl alcohol Place 1 drop into both eyes every 12 (twelve) hours.   ascorbic acid 500 MG tablet Commonly known as: VITAMIN C Take 500 mg by mouth 2 (two) times daily.   atorvastatin 10 MG tablet Commonly known as: LIPITOR Take 10 mg by  mouth every evening.   azelastine 0.1 % nasal spray Commonly known as: ASTELIN Place 2 sprays into both nostrils 2 (two) times daily.   b complex vitamins tablet Take 1 tablet by mouth daily.   CALCIUM CARBONATE-VITAMIN D3 PO Take 1 tablet by mouth 2 (two) times daily.   Calmoseptine 0.44-20.6 % Oint Generic drug: Menthol-Zinc Oxide Apply 1 application  topically every 8 (eight) hours as needed (for moisture-associated skin damage- right rear thigh/buttocks). Apply every 8 hours as needed for for moisture-associated skin damage (right rear thigh/buttocks) and at bedtime (SCHEDULED)   carvedilol 6.25 MG tablet Commonly known as: COREG Take 6.25 mg by mouth daily. SBP <100 and or HR <80   cetirizine 10 MG tablet Commonly known as: ZYRTEC Take 10 mg by mouth daily.   Cranberry 500 MG Tabs Take 500 mg by mouth daily.   dextromethorphan-guaiFENesin 30-600 MG 12hr tablet Commonly known as: MUCINEX DM Take 1 tablet by mouth every 12 (twelve) hours as needed for cough.   docusate sodium 100 MG capsule Commonly known as: COLACE Take 200 mg by mouth at bedtime.   ezetimibe 10 MG tablet Commonly known as: ZETIA Take 10 mg by mouth at bedtime.   famotidine 20 MG tablet Commonly known as: PEPCID Take 20 mg by mouth 2 (two) times daily.   ferrous sulfate 325 (65 FE) MG EC tablet Take 325 mg by mouth 3 (three) times daily with meals.   fluticasone 50 MCG/ACT nasal spray Commonly known as: FLONASE Place 2 sprays into both nostrils in the morning.   furosemide 20 MG tablet Commonly known as: LASIX Take 20 mg by mouth daily.   gabapentin 300 MG capsule Commonly known as: NEURONTIN Take 300 mg by mouth 3 (three) times daily.   ipratropium-albuterol 0.5-2.5 (3) MG/3ML Soln Commonly known as: DUONEB Take 3 mLs by nebulization every 6 (six) hours as needed (shortness of breath).   lisinopril 2.5 MG tablet Commonly known as: ZESTRIL Take 1.25 mg by mouth daily.   Mag-Oxide  200 MG Tabs Generic drug: Magnesium Oxide -Mg Supplement Take 400 mg by mouth 2 (two) times daily.   mirabegron ER 25 MG Tb24 tablet Commonly known as: MYRBETRIQ Take 25 mg by mouth in the morning.   oxyCODONE 5 MG immediate release tablet Commonly known as: Oxy IR/ROXICODONE Take 1 tablet (5 mg total) by mouth every 4 (four) hours as needed for moderate pain.   Pataday 0.1 % ophthalmic solution Generic drug: olopatadine Place 1 drop into both eyes every 6 (six) hours as needed for allergies.   polyethylene glycol 17 g packet  Commonly known as: MIRALAX / GLYCOLAX Take 17 g by mouth daily as needed for moderate constipation.   potassium chloride 10 MEQ tablet Commonly known as: KLOR-CON Take 10 mEq by mouth daily. Do not crush   Probiotic 250 MG Caps Take 250 mg by mouth daily.   rizatriptan 10 MG tablet Commonly known as: MAXALT Take 10 mg by mouth daily as needed for migraine.   sulfamethoxazole-trimethoprim 800-160 MG tablet Commonly known as: BACTRIM DS Take 1 tablet by mouth 2 (two) times daily.   Synthroid 200 MCG tablet Generic drug: levothyroxine Take 200 mcg by mouth daily before breakfast.   tamsulosin 0.4 MG Caps capsule Commonly known as: FLOMAX Take 1 capsule (0.4 mg total) by mouth daily after supper. What changed: when to take this   topiramate 50 MG tablet Commonly known as: TOPAMAX Take 50 mg by mouth 2 (two) times daily.   Vitamin D (Ergocalciferol) 1.25 MG (50000 UNIT) Caps capsule Commonly known as: DRISDOL Take 50,000 Units by mouth every Saturday.        Diagnostic Studies: DG C-Arm 1-60 Min  Result Date: 09/15/2022 CLINICAL DATA:  Percutaneous nephrolithotomy EXAM: DG C-ARM 1-60 MIN FLUOROSCOPY: Radiation Exposure Index (if provided by the fluoroscopic device): 20.81 mGy COMPARISON:  None Available. FINDINGS: Multiple intraoperative fluoroscopic spot images are provided. Intraoperative images demonstrating right nephrolithotomy. Left  nephroureteral stent in place. IMPRESSION: 1. Intraoperative images demonstrating right nephrolithotomy. Electronically Signed   By: Elige Ko M.D.   On: 09/15/2022 15:29   DG Abd Portable 1 View  Result Date: 09/06/2022 CLINICAL DATA:  Nephroureteral stent placement. EXAM: PORTABLE ABDOMEN - 1 VIEW COMPARISON:  Nephroureteral stent exchange dated Aug 24, 2022. FINDINGS: Right nephroureteral stent appears minimally retracted when compared to final image from recent catheter exchange, and is still likely in appropriate position. Unchanged staghorn right renal calculus. Large amount of stool in the rectosigmoid colon again noted. IMPRESSION: 1. Right nephroureteral stent appears minimally retracted compared to recent catheter exchange, and is still likely in appropriate position. Electronically Signed   By: Obie Dredge M.D.   On: 09/06/2022 13:22   IR EXT NEPHROURETERAL CATH EXCHANGE  Result Date: 08/24/2022 INDICATION: History of paraplegia with history of right-sided obstructive uropathy, post previous nephrostomy catheter placement, ultimately post placement of a nephroureteral catheter. Patient presents today for routine fluoroscopic guided exchange. Per report, patient is to undergo definitive nephrolithotomy procedure by Dr. Ronne Binning in the coming weeks. Note, the nephroureteral catheter has been capped for a trial of internalization. EXAM: FLUOROSCOPIC GUIDED RIGHT SIDED NEPHROURETERAL CATHETER EXCHANGE COMPARISON:  Image guided placement of right-sided nephroureteral catheter-06/17/2022 CONTRAST:  25 cc Omnipaque 300 administered into the collecting system FLUOROSCOPY TIME:  6 minutes, 18 seconds (35 mGy) COMPLICATIONS: None immediate. TECHNIQUE: Informed written consent was obtained from the patient after a discussion of the risks, benefits and alternatives to treatment. Questions regarding the procedure were encouraged and answered. A timeout was performed prior to the initiation of the  procedure. The right flank and external portion of existing nephroureteral catheter were prepped and draped in the usual sterile fashion. A sterile drape was applied covering the operative field. Maximum barrier sterile technique with sterile gowns and gloves were used for the procedure. A timeout was performed prior to the initiation of the procedure. Preprocedural spot fluoroscopic image demonstrates slight retraction of right-sided nephroureteral catheter with radiopaque marker at the level of the expected accessed renal calyx. Contrast injection demonstrated partial opacification of the right renal pelvis without passage of  contrast to the level of the urinary bladder Despite prolonged efforts with both an Amplatz stiff glidewire, there is great difficulty advancing the wire through the entirety of the nephroureteral catheter secondary to encrustation As such, a Kumpe catheter was inserted along the percutaneous track the nephroureteral catheter, and with the use of a stiff glidewire, advanced to the level of the urinary bladder. Contrast injection confirmed appropriate positioning. Next, the partially withdrawn nephroureteral catheter was removed intact. Over a Amplatz wire, a new, 10 [place], 25 cm nephroureteral catheter was placed over an Amplatz wire with inferior coil within the urinary bladder and superior coil at the level of the right renal pelvis. Contrast injection confirmed appropriate positioning and functionality of the nephroureteral catheter with passage of contrast to the level of the urinary bladder. The nephroureteral catheter was again capped for a trial of internalization (also due to the fact that previous attempts at externalization of the catheter has resulted in multiple previous inadvertent removals due to patient's totally dependent state). A dressing was applied. Patient tolerated the procedure well without immediate postprocedural complication. FINDINGS: The existing right-sided  nephroureteral catheter is partially retracted with superior coil at the level of the accessed renal calyx. Significant incrustation and occlusion of the catheter was noted at the time of exchange requiring percutaneous track recanalization. After fluoroscopic guided exchange, the new 10 [place], 25 nephroureteral catheter is appropriately positioned with inferior coil within the urinary bladder and superior coil at the level of the right renal pelvis. IMPRESSION: Challenging though ultimately successful fluoroscopic guided exchange right-sided 10 French, 25 cm nephroureteral catheter. PLAN: - The patient was encouraged to follow-up with Dr. Ronne Binning for definitive nephrolithotomy procedure. - Otherwise, the patient will return for routine fluoroscopic guided exchange in 6 weeks. Electronically Signed   By: Simonne Come M.D.   On: 08/24/2022 13:52    Disposition: Discharge disposition: 03-Skilled Nursing Facility       Discharge Instructions     Discharge patient   Complete by: As directed    Discharge disposition: 03-Skilled Nursing Facility   Discharge patient date: 09/16/2022          Signed: Wilkie Aye 09/16/2022, 12:18 PM

## 2022-09-16 NOTE — Progress Notes (Signed)
   09/16/22 0934  Assess: MEWS Score  Temp 98.6 F (37 C)  BP 106/62  MAP (mmHg) 75  Pulse Rate 82  Resp 18  SpO2 95 %  O2 Device Room Air  Assess: MEWS Score  MEWS Temp 0  MEWS Systolic 0  MEWS Pulse 0  MEWS RR 0  MEWS LOC 0  MEWS Score 0  MEWS Score Color Green  Assess: SIRS CRITERIA  SIRS Temperature  0  SIRS Pulse 0  SIRS Respirations  0  SIRS WBC 0  SIRS Score Sum  0

## 2022-09-16 NOTE — Progress Notes (Signed)
Nurse tech reported that patients foley catheter was leaking, patient reported that she has bladder spasms which causes it to leak a lot. MD McKenzie made aware.

## 2022-09-16 NOTE — TOC Transition Note (Signed)
Transition of Care [facility]) - CM/SW Discharge Note   Patient Details  Name: [name] MRN: [id number] Date of Birth: Jun 21, 1945  [facility] CM/SW Contact:  Villa Herb, LCSWA Phone Number: 09/16/2022, 12:22 PM   Clinical Narrative:    CSW updated that pt is medically ready for D/C back to [facility]. CSW spoke with Herbert Seta in admissions who states pt is a long term and can return. CSW requested report and room numbers from [place] with [facility]. CSW to provide RN with numbers. Med necessity completed and sent to floor for RN. CSW to call for EMS. TOC signing off.   Final next level of care: Long Term Nursing Home Barriers to Discharge: Barriers Resolved   Patient Goals and CMS Choice CMS Medicare.gov Compare Post Acute Care list provided to:: Patient Choice offered to / list presented to : Patient  Discharge Placement                         Discharge Plan and Services Additional resources added to the After Visit Summary for                                       Social Determinants of Health (SDOH) Interventions SDOH Screenings   Food Insecurity: No Food Insecurity (06/17/2022)  Housing: Low Risk  (06/17/2022)  Transportation Needs: No Transportation Needs (06/17/2022)  Utilities: Not At Risk (06/17/2022)  Tobacco Use: Medium Risk (09/15/2022)     Readmission Risk Interventions    11/15/2021   12:49 PM  Readmission Risk Prevention Plan  Transportation Screening Complete  HRI or Home Care Consult Complete  Social Work Consult for Recovery Care Planning/Counseling Complete  Palliative Care Screening Not Applicable  Medication Review Oceanographer) Complete

## 2022-09-16 NOTE — Care Management Obs Status (Signed)
MEDICARE OBSERVATION STATUS NOTIFICATION   Patient Details  Name: ADLER ALTON MRN: [id number] Date of Birth: 07-18-1945   Medicare Observation Status Notification Given:  Yes    Corey Harold 09/16/2022, 1:49 PM

## 2022-09-16 NOTE — Progress Notes (Signed)
Noted MD placed order to discontinue foley catheter. Patient reported that she has had a foley for 17 years and refused to allow staff to remove it. MD McKenzie made aware. Per MD keep foley.

## 2022-09-17 DIAGNOSIS — N200 Calculus of kidney: Secondary | ICD-10-CM | POA: Diagnosis not present

## 2022-09-17 NOTE — Progress Notes (Addendum)
Patient discharged to [facility], transported by EMS. Discharge paperwork placed in discharge packet to give to facility. Belongings sent with patient. Informed nurse at [facility] about patient being transferred back to facility. Patients nephrostomy tube and foley intact at discharge.

## 2022-09-19 ENCOUNTER — Ambulatory Visit (INDEPENDENT_AMBULATORY_CARE_PROVIDER_SITE_OTHER): Payer: Medicare Other | Admitting: Urology

## 2022-09-19 VITALS — BP 118/76 | HR 77

## 2022-09-19 DIAGNOSIS — N200 Calculus of kidney: Secondary | ICD-10-CM

## 2022-09-20 ENCOUNTER — Encounter (HOSPITAL_COMMUNITY): Payer: Self-pay | Admitting: Urology

## 2022-09-23 LAB — CALCULI, WITH PHOTOGRAPH (CLINICAL LAB)
Carbonate Apatite: 40 %
Mg NH4 PO4 (Struvite): 60 %
Weight Calculi: 21374 mg

## 2022-09-25 NOTE — Progress Notes (Signed)
09/19/2022 8:41 PM   KIMISHA KEITH 04-17-1945 [id number]  Referring provider: Lindaann Pascal [address] [place],  [place] [address]     HPI: Ms Giammona is a [phone] here followup after PCNL. She denies any fevers. No hematuria   PMH: Past Medical History:  Diagnosis Date   Arthritis    Bipolar 1 disorder (HCC)    COPD with asthma    Diabetes mellitus without complication (HCC)    Dyspnea    GERD (gastroesophageal reflux disease)    Hypertension    Obesity    Osteoarthritis    Paraplegic spinal paralysis (HCC)    waist down   Pyelonephritis    Thyroid disease     Surgical History: Past Surgical History:  Procedure Laterality Date   ABDOMINAL HYSTERECTOMY     BACK SURGERY     x 4   BUNIONECTOMY Left    CARPAL TUNNEL RELEASE Bilateral    CYSTOSCOPY Right 07/21/2022   Procedure: CYSTOSCOPY;  Surgeon: Malen Gauze, MD;  Location: [facility];  Service: Urology;  Laterality: Right;   IR EXT NEPHROURETERAL CATH EXCHANGE  08/24/2022   IR NEPHROSTOMY EXCHANGE LEFT  02/16/2022   IR NEPHROSTOMY EXCHANGE RIGHT  03/10/2022   IR NEPHROSTOMY EXCHANGE RIGHT  06/16/2022   IR NEPHROSTOMY PLACEMENT RIGHT  12/24/2021   IR NEPHROSTOMY PLACEMENT RIGHT  06/17/2022   IR NEPHROURETERAL CATH PLACE RIGHT  03/25/2022   NEPHROLITHOTOMY Right 09/15/2022   Procedure: NEPHROLITHOTOMY PERCUTANEOUS- already has tube;  Surgeon: Malen Gauze, MD;  Location: [facility];  Service: Urology;  Laterality: Right;  pt can not come in the AM - Dr. Requests PM surgery time    Home Medications:  Allergies as of 09/19/2022       Reactions   Codeine Nausea Only, Other (See Comments)   "Allergic," per facility's paperwork   Invanz [ertapenem] Other (See Comments)   "Allergic," per facility's paperwork   Propranolol Hcl Other (See Comments)   "Allergic," per facility's paperwork   Rocephin [ceftriaxone] Hives, Other (See Comments)   "Allergic," per facility's paperwork   Vancomycin Hives, Other  (See Comments)   "Allergic," per facility's paperwork        Medication List        Accurate as of September 19, 2022 11:59 PM. If you have any questions, ask your nurse or doctor.          acetaminophen 325 MG tablet Commonly known as: TYLENOL Take 1 tablet (325 mg total) by mouth every 6 (six) hours as needed for mild pain, moderate pain, fever or headache. *May take one every 6 hours as needed for pain What changed:  how much to take when to take this additional instructions   Arginaid Pack Take 1 packet by mouth daily.   Artificial Tears 1.4 % ophthalmic solution Generic drug: polyvinyl alcohol Place 1 drop into both eyes every 12 (twelve) hours.   ascorbic acid 500 MG tablet Commonly known as: VITAMIN C Take 500 mg by mouth 2 (two) times daily.   atorvastatin 10 MG tablet Commonly known as: LIPITOR Take 10 mg by mouth every evening.   azelastine 0.1 % nasal spray Commonly known as: ASTELIN Place 2 sprays into both nostrils 2 (two) times daily.   b complex vitamins tablet Take 1 tablet by mouth daily.   CALCIUM CARBONATE-VITAMIN D3 PO Take 1 tablet by mouth 2 (two) times daily.   Calmoseptine 0.44-20.6 % Oint Generic drug: Menthol-Zinc Oxide Apply 1 application  topically every 8 (eight) hours as needed (for moisture-associated skin damage- right rear thigh/buttocks). Apply every 8 hours as needed for for moisture-associated skin damage (right rear thigh/buttocks) and at bedtime (SCHEDULED)   carvedilol 6.25 MG tablet Commonly known as: COREG Take 6.25 mg by mouth daily. SBP <100 and or HR <80   cetirizine 10 MG tablet Commonly known as: ZYRTEC Take 10 mg by mouth daily.   Cranberry 500 MG Tabs Take 500 mg by mouth daily.   dextromethorphan-guaiFENesin 30-600 MG 12hr tablet Commonly known as: MUCINEX DM Take 1 tablet by mouth every 12 (twelve) hours as needed for cough.   docusate sodium 100 MG capsule Commonly known as: COLACE Take 200 mg by mouth  at bedtime.   ezetimibe 10 MG tablet Commonly known as: ZETIA Take 10 mg by mouth at bedtime.   famotidine 20 MG tablet Commonly known as: PEPCID Take 20 mg by mouth 2 (two) times daily.   ferrous sulfate 325 (65 FE) MG EC tablet Take 325 mg by mouth 3 (three) times daily with meals.   fluticasone 50 MCG/ACT nasal spray Commonly known as: FLONASE Place 2 sprays into both nostrils in the morning.   furosemide 20 MG tablet Commonly known as: LASIX Take 20 mg by mouth daily.   gabapentin 300 MG capsule Commonly known as: NEURONTIN Take 300 mg by mouth 3 (three) times daily.   ipratropium-albuterol 0.5-2.5 (3) MG/3ML Soln Commonly known as: DUONEB Take 3 mLs by nebulization every 6 (six) hours as needed (shortness of breath).   lisinopril 2.5 MG tablet Commonly known as: ZESTRIL Take 1.25 mg by mouth daily.   Mag-Oxide 200 MG Tabs Generic drug: Magnesium Oxide -Mg Supplement Take 400 mg by mouth 2 (two) times daily.   mirabegron ER 25 MG Tb24 tablet Commonly known as: MYRBETRIQ Take 25 mg by mouth in the morning.   Pataday 0.1 % ophthalmic solution Generic drug: olopatadine Place 1 drop into both eyes every 6 (six) hours as needed for allergies.   polyethylene glycol 17 g packet Commonly known as: MIRALAX / GLYCOLAX Take 17 g by mouth daily as needed for moderate constipation.   potassium chloride 10 MEQ tablet Commonly known as: KLOR-CON Take 10 mEq by mouth daily. Do not crush   Probiotic 250 MG Caps Take 250 mg by mouth daily.   rizatriptan 10 MG tablet Commonly known as: MAXALT Take 10 mg by mouth daily as needed for migraine.   sulfamethoxazole-trimethoprim 800-160 MG tablet Commonly known as: BACTRIM DS Take 1 tablet by mouth 2 (two) times daily.   Synthroid 200 MCG tablet Generic drug: levothyroxine Take 200 mcg by mouth daily before breakfast.   tamsulosin 0.4 MG Caps capsule Commonly known as: FLOMAX Take 1 capsule (0.4 mg total) by mouth  daily after supper. What changed: when to take this   topiramate 50 MG tablet Commonly known as: TOPAMAX Take 50 mg by mouth 2 (two) times daily.   Vitamin D (Ergocalciferol) 1.25 MG (50000 UNIT) Caps capsule Commonly known as: DRISDOL Take 50,000 Units by mouth every Saturday.        Allergies:  Allergies  Allergen Reactions   Codeine Nausea Only and Other (See Comments)    "Allergic," per facility's paperwork   Invanz [Ertapenem] Other (See Comments)    "Allergic," per facility's paperwork   Propranolol Hcl Other (See Comments)    "Allergic," per facility's paperwork   Rocephin [Ceftriaxone] Hives and Other (See Comments)    "Allergic," per facility's paperwork  Vancomycin Hives and Other (See Comments)    "Allergic," per facility's paperwork    Family History: Family History  Adopted: Yes  Problem Relation Age of Onset   Heart disease Mother     Social History:  reports that she has quit smoking. She has never used smokeless tobacco. She reports that she does not currently use alcohol. She reports that she does not use drugs.  ROS: All other review of systems were reviewed and are negative except what is noted above in HPI  Physical Exam: BP 118/76   Pulse 77   Constitutional:  Alert and oriented, No acute distress. HEENT: [place] AT, moist mucus membranes.  Trachea midline, no masses. Cardiovascular: No clubbing, cyanosis, or edema. Respiratory: Normal respiratory effort, no increased work of breathing. GI: Abdomen is soft, nontender, nondistended, no abdominal masses GU: No CVA tenderness.  Lymph: No cervical or inguinal lymphadenopathy. Skin: No rashes, bruises or suspicious lesions. Neurologic: Grossly intact, no focal deficits, moving all 4 extremities. Psychiatric: Normal mood and affect.  Laboratory Data: Lab Results  Component Value Date   WBC 9.0 09/16/2022   HGB 10.8 (L) 09/16/2022   HCT 35.6 (L) 09/16/2022   MCV 89.0 09/16/2022   PLT 213  09/16/2022    Lab Results  Component Value Date   CREATININE 0.47 09/16/2022    No results found for: "PSA"  No results found for: "TESTOSTERONE"  Lab Results  Component Value Date   HGBA1C 6.0 (H) 11/15/2021    Urinalysis    Component Value Date/Time   COLORURINE YELLOW 12/24/2021 0244   APPEARANCEUR TURBID (A) 12/24/2021 0244   APPEARANCEUR Cloudy (A) 02/23/2021 1343   LABSPEC 1.016 12/24/2021 0244   PHURINE 5.0 12/24/2021 0244   GLUCOSEU NEGATIVE 12/24/2021 0244   HGBUR SMALL (A) 12/24/2021 0244   BILIRUBINUR NEGATIVE 12/24/2021 0244   BILIRUBINUR Negative 02/23/2021 1343   KETONESUR 5 (A) 12/24/2021 0244   PROTEINUR 100 (A) 12/24/2021 0244   UROBILINOGEN 0.2 09/08/2014 0436   NITRITE NEGATIVE 12/24/2021 0244   LEUKOCYTESUR LARGE (A) 12/24/2021 0244    Lab Results  Component Value Date   LABMICR See below: 02/23/2021   WBCUA 11-30 (A) 02/23/2021   LABEPIT 0-10 02/23/2021   MUCUS Present 02/23/2021   BACTERIA MANY (A) 12/24/2021    Pertinent Imaging:  No results found for this or any previous visit.  No results found for this or any previous visit.  No results found for this or any previous visit.  No results found for this or any previous visit.  No results found for this or any previous visit.  No valid procedures specified. No results found for this or any previous visit.  Results for orders placed during the hospital encounter of 02/23/22  CT Renal Stone Study  Narrative CLINICAL DATA:  Nephrostomy tube catheter pulled out about 3 inches in a patient with large renal calculus.  EXAM: CT ABDOMEN AND PELVIS WITHOUT CONTRAST  TECHNIQUE: Multidetector CT imaging of the abdomen and pelvis was performed following the standard protocol without IV contrast.  RADIATION DOSE REDUCTION: This exam was performed according to the departmental dose-optimization program which includes automated exposure control, adjustment of the mA and/or kV  according to patient size and/or use of iterative reconstruction technique.  COMPARISON:  January 31, 2022  FINDINGS: Lower chest: Slightly improved aeration at the RIGHT lung base still with patchy ground-glass nodularity, volume loss and consolidative changes. These are mild and improved since previous imaging. No pleural effusion. Mild LEFT  basilar atelectasis.  Hepatobiliary: Nodular hepatic contour with signs of fissural widening. Sludge in the gallbladder. No pericholecystic stranding. No gross biliary duct dilation.  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 No sign of pancreatic inflammation.  Spleen: Mild splenomegaly unchanged.  Adrenals/Urinary Tract: Adrenal glands are normal.  Marked perinephric stranding. Nephrostomy tube in place with marker for sideholes within the parenchyma of the interpolar RIGHT kidney, not extending beyond the renal parenchyma. Marker for sideholes with similar appearance in terms of positioning compared to previous imaging. Pigtail portion is coiled within lower pole collecting system elements similar to previous imaging. Large branch type calculus with lamellated appearance extends into the UPJ from the renal sinus measuring 3.7 x 2.7 cm similar to prior imaging. Renal cysts and marked RIGHT renal cortical atrophy are similar to prior imaging as well.  LEFT renal lesions are unchanged including an intermediate density lesion arising from the lower pole measuring approximately 1.7 cm, this area shown to represent a Bosniak category II cyst on prior imaging. LEFT renal cysts are stable. No dedicated follow-up imaging is suggested for renal cysts.  Urinary bladder is collapsed with Foley catheter in place. There is no hydronephrosis. Distal RIGHT ureteral calculi with similar appearance, largest measuring 11 mm just proximal to the RIGHT UVJ. Two additional calculi present in the RIGHT ureter. The largest 11 mm calculus may have  progressed slightly towards the UVJ since previous imaging.  Stomach/Bowel: Signs of fecal impaction with large burden of stool in the rectum which shows wall thickening despite rectal distension. Perirectal stranding is similar to previous imaging. Perianal stranding also present. Rectum distended up to 9.5 cm. Slightly increased stool in the distal sigmoid. No signs of overt obstruction of upstream loops of bowel currently. Stomach under distended. No signs of small bowel dilation.  Vascular/Lymphatic:  Aortic atherosclerosis. No sign of aneurysm. Smooth contour of the IVC. There is no gastrohepatic or hepatoduodenal ligament lymphadenopathy. No retroperitoneal or mesenteric lymphadenopathy.  No pelvic sidewall lymphadenopathy.  Atherosclerotic changes are moderate to marked.  Reproductive: Post hysterectomy.  Other: No free air or ascites.  No pneumatosis.  Musculoskeletal: Destructive changes about L2-3 with chronic appearance. Levo convexity of the spine and degenerative changes. Marked muscular atrophy.  Stage IV decubitus ulceration overlying the RIGHT ischium with sclerotic appearing ischial tuberosity suggesting chronic osteomyelitis. Small amount of gas tracking posterior to the acetabulum and along the RIGHT sacro tuberous ligament. Fluid containing tract extending along the structures previously. Gas in continuity with the skin ulceration and perhaps with some packing material in the wound.  Adjacent perianal thickening/stranding in the setting of suspected fecal impaction. No well-formed fluid collection.  IMPRESSION: 1. Nephrostomy tube remaining in lower pole collecting systems of the RIGHT kidney, no signs of hydronephrosis with similar appearance of large staghorn calculus and overall similar appearance of distal ureteral calculi, perhaps mild distal migration of the largest calculus. No hydronephrosis. 2. Signs of stercoral proctitis and colitis due to  fecal impaction similar to previous imaging. 3. Decubitus ulceration with local extension of fluid in gas along the acetabulum and sacrotuberous ligament. Fluid in these areas previously but no well-formed collection. Findings favored to represent gas tracking from the open wound into these areas but would suggest close attention on follow-up and correlation with direct clinical inspection to ensure no worsening and development of more aggressive soft tissue infection. 4. Aortic atherosclerosis. 5. Nodular hepatic contours raising the question of background liver disease. 6. Contralateral LEFT nephrolithiasis.  Aortic Atherosclerosis (ICD10-I70.0).  Electronically Signed By: Donzetta Kohut M.D. On: 02/23/2022 14:46   Assessment & Plan:    1. Nephrolithiasis Nephrolstomy tube removed today. Followup in 1 week for stent removal   No follow-ups on file.  Wilkie Aye, MD  [facility] [place]

## 2022-09-25 NOTE — Patient Instructions (Signed)

## 2022-09-28 ENCOUNTER — Ambulatory Visit (INDEPENDENT_AMBULATORY_CARE_PROVIDER_SITE_OTHER): Payer: Medicare Other | Admitting: Urology

## 2022-09-28 VITALS — BP 125/77 | HR 79

## 2022-09-28 DIAGNOSIS — N200 Calculus of kidney: Secondary | ICD-10-CM

## 2022-09-28 MED ORDER — CIPROFLOXACIN HCL 500 MG PO TABS
500.0000 mg | ORAL_TABLET | Freq: Once | ORAL | Status: AC
Start: 2022-09-28 — End: ?

## 2022-09-28 NOTE — Progress Notes (Signed)
09/28/2022 8:26 AM   [name] 08/12/1945 [id number]  Referring provider: Lindaann Pascal [address] [place],  [place] [address]  Followup nephrolithiasis   HPI: [name] is a 77yo here for followup for nephrolithiasis. Ureteral stent was found to be eminating from urethra and was removed while replacing her foley catheter   PMH: Past Medical History:  Diagnosis Date   Arthritis    Bipolar 1 disorder (HCC)    COPD with asthma    Diabetes mellitus without complication (HCC)    Dyspnea    GERD (gastroesophageal reflux disease)    Hypertension    Obesity    Osteoarthritis    Paraplegic spinal paralysis (HCC)    waist down   Pyelonephritis    Thyroid disease     Surgical History: Past Surgical History:  Procedure Laterality Date   ABDOMINAL HYSTERECTOMY     BACK SURGERY     x 4   BUNIONECTOMY Left    CARPAL TUNNEL RELEASE Bilateral    CYSTOSCOPY Right 07/21/2022   Procedure: CYSTOSCOPY;  Surgeon: Malen Gauze, MD;  Location: [facility];  Service: Urology;  Laterality: Right;   IR EXT NEPHROURETERAL CATH EXCHANGE  08/24/2022   IR NEPHROSTOMY EXCHANGE LEFT  02/16/2022   IR NEPHROSTOMY EXCHANGE RIGHT  03/10/2022   IR NEPHROSTOMY EXCHANGE RIGHT  06/16/2022   IR NEPHROSTOMY PLACEMENT RIGHT  12/24/2021   IR NEPHROSTOMY PLACEMENT RIGHT  06/17/2022   IR NEPHROURETERAL CATH PLACE RIGHT  03/25/2022   NEPHROLITHOTOMY Right 09/15/2022   Procedure: NEPHROLITHOTOMY PERCUTANEOUS- already has tube;  Surgeon: Malen Gauze, MD;  Location: [facility];  Service: Urology;  Laterality: Right;  pt can not come in the AM - Dr. Requests PM surgery time    Home Medications:  Allergies as of 09/28/2022       Reactions   Codeine Nausea Only, Other (See Comments)   "Allergic," per facility's paperwork   Invanz [ertapenem] Other (See Comments)   "Allergic," per facility's paperwork   Propranolol Hcl Other (See Comments)   "Allergic," per facility's paperwork   Rocephin  [ceftriaxone] Hives, Other (See Comments)   "Allergic," per facility's paperwork   Vancomycin Hives, Other (See Comments)   "Allergic," per facility's paperwork        Medication List        Accurate as of September 28, 2022 11:59 PM. If you have any questions, ask your nurse or doctor.          acetaminophen 325 MG tablet Commonly known as: TYLENOL Take 1 tablet (325 mg total) by mouth every 6 (six) hours as needed for mild pain, moderate pain, fever or headache. *May take one every 6 hours as needed for pain What changed:  how much to take when to take this additional instructions   Arginaid Pack Take 1 packet by mouth daily.   Artificial Tears 1.4 % ophthalmic solution Generic drug: polyvinyl alcohol Place 1 drop into both eyes every 12 (twelve) hours.   ascorbic acid 500 MG tablet Commonly known as: VITAMIN C Take 500 mg by mouth 2 (two) times daily.   atorvastatin 10 MG tablet Commonly known as: LIPITOR Take 10 mg by mouth every evening.   azelastine 0.1 % nasal spray Commonly known as: ASTELIN Place 2 sprays into both nostrils 2 (two) times daily.   b complex vitamins tablet Take 1 tablet by mouth daily.   CALCIUM CARBONATE-VITAMIN D3 PO Take 1 tablet by mouth 2 (two) times daily.  Calmoseptine 0.44-20.6 % Oint Generic drug: Menthol-Zinc Oxide Apply 1 application  topically every 8 (eight) hours as needed (for moisture-associated skin damage- right rear thigh/buttocks). Apply every 8 hours as needed for for moisture-associated skin damage (right rear thigh/buttocks) and at bedtime (SCHEDULED)   carvedilol 6.25 MG tablet Commonly known as: COREG Take 6.25 mg by mouth daily. SBP <100 and or HR <80   cetirizine 10 MG tablet Commonly known as: ZYRTEC Take 10 mg by mouth daily.   Cranberry 500 MG Tabs Take 500 mg by mouth daily.   dextromethorphan-guaiFENesin 30-600 MG 12hr tablet Commonly known as: MUCINEX DM Take 1 tablet by mouth every 12 (twelve)  hours as needed for cough.   docusate sodium 100 MG capsule Commonly known as: COLACE Take 200 mg by mouth at bedtime.   ezetimibe 10 MG tablet Commonly known as: ZETIA Take 10 mg by mouth at bedtime.   famotidine 20 MG tablet Commonly known as: PEPCID Take 20 mg by mouth 2 (two) times daily.   ferrous sulfate 325 (65 FE) MG EC tablet Take 325 mg by mouth 3 (three) times daily with meals.   fluticasone 50 MCG/ACT nasal spray Commonly known as: FLONASE Place 2 sprays into both nostrils in the morning.   furosemide 20 MG tablet Commonly known as: LASIX Take 20 mg by mouth daily.   gabapentin 300 MG capsule Commonly known as: NEURONTIN Take 300 mg by mouth 3 (three) times daily.   ipratropium-albuterol 0.5-2.5 (3) MG/3ML Soln Commonly known as: DUONEB Take 3 mLs by nebulization every 6 (six) hours as needed (shortness of breath).   lisinopril 2.5 MG tablet Commonly known as: ZESTRIL Take 1.25 mg by mouth daily.   Mag-Oxide 200 MG Tabs Generic drug: Magnesium Oxide -Mg Supplement Take 400 mg by mouth 2 (two) times daily.   mirabegron ER 25 MG Tb24 tablet Commonly known as: MYRBETRIQ Take 25 mg by mouth in the morning.   Pataday 0.1 % ophthalmic solution Generic drug: olopatadine Place 1 drop into both eyes every 6 (six) hours as needed for allergies.   polyethylene glycol 17 g packet Commonly known as: MIRALAX / GLYCOLAX Take 17 g by mouth daily as needed for moderate constipation.   potassium chloride 10 MEQ tablet Commonly known as: KLOR-CON Take 10 mEq by mouth daily. Do not crush   Probiotic 250 MG Caps Take 250 mg by mouth daily.   rizatriptan 10 MG tablet Commonly known as: MAXALT Take 10 mg by mouth daily as needed for migraine.   sulfamethoxazole-trimethoprim 800-160 MG tablet Commonly known as: BACTRIM DS Take 1 tablet by mouth 2 (two) times daily.   Synthroid 200 MCG tablet Generic drug: levothyroxine Take 200 mcg by mouth daily before  breakfast.   tamsulosin 0.4 MG Caps capsule Commonly known as: FLOMAX Take 1 capsule (0.4 mg total) by mouth daily after supper. What changed: when to take this   topiramate 50 MG tablet Commonly known as: TOPAMAX Take 50 mg by mouth 2 (two) times daily.   Vitamin D (Ergocalciferol) 1.25 MG (50000 UNIT) Caps capsule Commonly known as: DRISDOL Take 50,000 Units by mouth every Saturday.        Allergies:  Allergies  Allergen Reactions   Codeine Nausea Only and Other (See Comments)    "Allergic," per facility's paperwork   Invanz [Ertapenem] Other (See Comments)    "Allergic," per facility's paperwork   Propranolol Hcl Other (See Comments)    "Allergic," per facility's paperwork   Rocephin [Ceftriaxone]  Hives and Other (See Comments)    "Allergic," per facility's paperwork   Vancomycin Hives and Other (See Comments)    "Allergic," per facility's paperwork    Family History: Family History  Adopted: Yes  Problem Relation Age of Onset   Heart disease Mother     Social History:  reports that she has quit smoking. She has never used smokeless tobacco. She reports that she does not currently use alcohol. She reports that she does not use drugs.  ROS: All other review of systems were reviewed and are negative except what is noted above in HPI  Physical Exam: BP 125/77   Pulse 79   Constitutional:  Alert and oriented, No acute distress. HEENT: Gay AT, moist mucus membranes.  Trachea midline, no masses. Cardiovascular: No clubbing, cyanosis, or edema. Respiratory: Normal respiratory effort, no increased work of breathing. GI: Abdomen is soft, nontender, nondistended, no abdominal masses GU: No CVA tenderness.  Lymph: No cervical or inguinal lymphadenopathy. Skin: No rashes, bruises or suspicious lesions. Neurologic: Grossly intact, no focal deficits, moving all 4 extremities. Psychiatric: Normal mood and affect.  Laboratory Data: Lab Results  Component Value Date    WBC 9.0 09/16/2022   HGB 10.8 (L) 09/16/2022   HCT 35.6 (L) 09/16/2022   MCV 89.0 09/16/2022   PLT 213 09/16/2022    Lab Results  Component Value Date   CREATININE 0.47 09/16/2022    No results found for: "PSA"  No results found for: "TESTOSTERONE"  Lab Results  Component Value Date   HGBA1C 6.0 (H) 11/15/2021    Urinalysis    Component Value Date/Time   COLORURINE YELLOW 12/24/2021 0244   APPEARANCEUR TURBID (A) 12/24/2021 0244   APPEARANCEUR Cloudy (A) 02/23/2021 1343   LABSPEC 1.016 12/24/2021 0244   PHURINE 5.0 12/24/2021 0244   GLUCOSEU NEGATIVE 12/24/2021 0244   HGBUR SMALL (A) 12/24/2021 0244   BILIRUBINUR NEGATIVE 12/24/2021 0244   BILIRUBINUR Negative 02/23/2021 1343   KETONESUR 5 (A) 12/24/2021 0244   PROTEINUR 100 (A) 12/24/2021 0244   UROBILINOGEN 0.2 09/08/2014 0436   NITRITE NEGATIVE 12/24/2021 0244   LEUKOCYTESUR LARGE (A) 12/24/2021 0244    Lab Results  Component Value Date   LABMICR See below: 02/23/2021   WBCUA 11-30 (A) 02/23/2021   LABEPIT 0-10 02/23/2021   MUCUS Present 02/23/2021   BACTERIA MANY (A) 12/24/2021    Pertinent Imaging:  No results found for this or any previous visit.  No results found for this or any previous visit.  No results found for this or any previous visit.  No results found for this or any previous visit.  No results found for this or any previous visit.  No valid procedures specified. No results found for this or any previous visit.  Results for orders placed during the hospital encounter of 02/23/22  CT Renal Stone Study  Narrative CLINICAL DATA:  Nephrostomy tube catheter pulled out about 3 inches in a patient with large renal calculus.  EXAM: CT ABDOMEN AND PELVIS WITHOUT CONTRAST  TECHNIQUE: Multidetector CT imaging of the abdomen and pelvis was performed following the standard protocol without IV contrast.  RADIATION DOSE REDUCTION: This exam was performed according to the departmental  dose-optimization program which includes automated exposure control, adjustment of the mA and/or kV according to patient size and/or use of iterative reconstruction technique.  COMPARISON:  January 31, 2022  FINDINGS: Lower chest: Slightly improved aeration at the RIGHT lung base still with patchy ground-glass nodularity, volume loss and consolidative  changes. These are mild and improved since previous imaging. No pleural effusion. Mild LEFT basilar atelectasis.  Hepatobiliary: Nodular hepatic contour with signs of fissural widening. Sludge in the gallbladder. No pericholecystic stranding. No gross biliary duct dilation.  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 No sign of pancreatic inflammation.  Spleen: Mild splenomegaly unchanged.  Adrenals/Urinary Tract: Adrenal glands are normal.  Marked perinephric stranding. Nephrostomy tube in place with marker for sideholes within the parenchyma of the interpolar RIGHT kidney, not extending beyond the renal parenchyma. Marker for sideholes with similar appearance in terms of positioning compared to previous imaging. Pigtail portion is coiled within lower pole collecting system elements similar to previous imaging. Large branch type calculus with lamellated appearance extends into the UPJ from the renal sinus measuring 3.7 x 2.7 cm similar to prior imaging. Renal cysts and marked RIGHT renal cortical atrophy are similar to prior imaging as well.  LEFT renal lesions are unchanged including an intermediate density lesion arising from the lower pole measuring approximately 1.7 cm, this area shown to represent a Bosniak category II cyst on prior imaging. LEFT renal cysts are stable. No dedicated follow-up imaging is suggested for renal cysts.  Urinary bladder is collapsed with Foley catheter in place. There is no hydronephrosis. Distal RIGHT ureteral calculi with similar appearance, largest measuring 11 mm just proximal to the  RIGHT UVJ. Two additional calculi present in the RIGHT ureter. The largest 11 mm calculus may have progressed slightly towards the UVJ since previous imaging.  Stomach/Bowel: Signs of fecal impaction with large burden of stool in the rectum which shows wall thickening despite rectal distension. Perirectal stranding is similar to previous imaging. Perianal stranding also present. Rectum distended up to 9.5 cm. Slightly increased stool in the distal sigmoid. No signs of overt obstruction of upstream loops of bowel currently. Stomach under distended. No signs of small bowel dilation.  Vascular/Lymphatic:  Aortic atherosclerosis. No sign of aneurysm. Smooth contour of the IVC. There is no gastrohepatic or hepatoduodenal ligament lymphadenopathy. No retroperitoneal or mesenteric lymphadenopathy.  No pelvic sidewall lymphadenopathy.  Atherosclerotic changes are moderate to marked.  Reproductive: Post hysterectomy.  Other: No free air or ascites.  No pneumatosis.  Musculoskeletal: Destructive changes about L2-3 with chronic appearance. Levo convexity of the spine and degenerative changes. Marked muscular atrophy.  Stage IV decubitus ulceration overlying the RIGHT ischium with sclerotic appearing ischial tuberosity suggesting chronic osteomyelitis. Small amount of gas tracking posterior to the acetabulum and along the RIGHT sacro tuberous ligament. Fluid containing tract extending along the structures previously. Gas in continuity with the skin ulceration and perhaps with some packing material in the wound.  Adjacent perianal thickening/stranding in the setting of suspected fecal impaction. No well-formed fluid collection.  IMPRESSION: 1. Nephrostomy tube remaining in lower pole collecting systems of the RIGHT kidney, no signs of hydronephrosis with similar appearance of large staghorn calculus and overall similar appearance of distal ureteral calculi, perhaps mild distal  migration of the largest calculus. No hydronephrosis. 2. Signs of stercoral proctitis and colitis due to fecal impaction similar to previous imaging. 3. Decubitus ulceration with local extension of fluid in gas along the acetabulum and sacrotuberous ligament. Fluid in these areas previously but no well-formed collection. Findings favored to represent gas tracking from the open wound into these areas but would suggest close attention on follow-up and correlation with direct clinical inspection to ensure no worsening and development of more aggressive soft tissue infection. 4. Aortic atherosclerosis. 5. Nodular hepatic contours raising the  question of background liver disease. 6. Contralateral LEFT nephrolithiasis.  Aortic Atherosclerosis (ICD10-I70.0).   Electronically Signed By: Donzetta Kohut M.D. On: 02/23/2022 14:46   Assessment & Plan:    1. Nephrolithiasis -stent removed today - Ultrasound renal complete; Future   No follow-ups on file.  Wilkie Aye, MD  [facility] [place]

## 2022-09-28 NOTE — Patient Instructions (Signed)
Renal US Instructions  Please arrive 15 minutes prior to appointment time Arrive with a full bladder- 32 oz of water 30 minutes  Prior to [place]. Do not Void prior to Ultrasound.

## 2022-10-05 ENCOUNTER — Inpatient Hospital Stay (HOSPITAL_COMMUNITY): Admission: RE | Admit: 2022-10-05 | Payer: Medicare Other | Source: Ambulatory Visit

## 2022-10-12 ENCOUNTER — Other Ambulatory Visit: Payer: Self-pay

## 2022-10-28 ENCOUNTER — Ambulatory Visit (HOSPITAL_COMMUNITY)
Admission: RE | Admit: 2022-10-28 | Discharge: 2022-10-28 | Disposition: A | Discharge status: Home / Self Care | Payer: Medicare Other | Source: Ambulatory Visit | Attending: Urology | Admitting: Urology

## 2022-10-28 DIAGNOSIS — N200 Calculus of kidney: Secondary | ICD-10-CM | POA: Diagnosis not present

## 2022-11-07 NOTE — Progress Notes (Signed)
Letter sent.

## 2022-11-07 NOTE — Progress Notes (Signed)
Please let pt know RUS showed no acute findings. Bilateral renal cysts; no stones, masses, or hydronephrosis. Thanks.

## 2022-11-08 ENCOUNTER — Ambulatory Visit: Payer: Medicare Other | Admitting: Urology

## 2022-11-24 ENCOUNTER — Ambulatory Visit: Payer: Medicare Other | Admitting: Urology

## 2022-12-02 ENCOUNTER — Ambulatory Visit: Payer: Medicare Other | Admitting: Urology

## 2022-12-20 ENCOUNTER — Ambulatory Visit: Payer: Medicare Other | Admitting: Urology

## 2022-12-22 ENCOUNTER — Ambulatory Visit: Payer: Medicare Other | Admitting: Urology

## 2022-12-28 ENCOUNTER — Telehealth: Payer: Self-pay

## 2022-12-28 NOTE — Telephone Encounter (Signed)
Spoke to patient's nurse April making her aware that we need patient's BMP/CMP labs results within the past two months. April states she will faxed over labs and voiced understanding.

## 2022-12-28 NOTE — Progress Notes (Deleted)
Name: [name] DOB: 1946/03/20 MRN: [id number]  History of Present Illness: [name] is a 77 y.o. female who presents today for follow up visit at [facility]. She is bed-bound due to paraplegia. - GU History: 1. Neurogenic bladder with urinary retention. ***Managed with chronic indwelling Foley catheter. 2. Kidney stones. 3. Prior episode of pyelonephritis.  Recent history: > 09/15/2022: Underwent right PCNL with placement of right ureteral stent and right nephrostomy tube.  > 09/19/2022: Seen by Dr. Ronne Binning. Nephrostomy stent removed.  > 09/28/2022: Seen by Dr. Ronne Binning. Ureteral stent removed.  Since last visit: 10/28/2022: Renal US showed bilateral renal cysts; no GU stones, masses, or hydronephrosis.  Today: She reports the catheter is draining ***.  It was last exchanged ***.  She {Actions; denies-reports:120008} gross hematuria.  She {Actions; denies-reports:120008} flank pain. She {Actions; denies-reports:120008} abdominal pain.  She {Actions; denies-reports:120008} fevers. She {Actions; denies-reports:120008} nausea/vomiting.  She {Actions; denies-reports:120008} flank pain or abdominal pain. She {Actions; denies-reports:120008} fevers, nausea, or vomiting.  ***[facility] in 6 months for stone surveillance ***continue routine catheter exchanges once every 4 weeks at SNF   Medications: Current Outpatient Medications  Medication Sig Dispense Refill   acetaminophen (TYLENOL) 325 MG tablet Take 1 tablet (325 mg total) by mouth every 6 (six) hours as needed for mild pain, moderate pain, fever or headache. *May take one every 6 hours as needed for pain (Patient taking differently: Take 650 mg by mouth every 6 (six) hours.)     ARTIFICIAL TEARS 1.4 % ophthalmic solution Place 1 drop into both eyes every 12 (twelve) hours.     atorvastatin (LIPITOR) 10 MG tablet Take 10 mg by mouth every evening.     azelastine (ASTELIN) 0.1 % nasal spray Place 2 sprays  into both nostrils 2 (two) times daily.     b complex vitamins tablet Take 1 tablet by mouth daily.     Calcium Carb-Cholecalciferol (CALCIUM CARBONATE-VITAMIN D3 PO) Take 1 tablet by mouth 2 (two) times daily.     CALMOSEPTINE 0.44-20.6 % OINT Apply 1 application  topically every 8 (eight) hours as needed (for moisture-associated skin damage- right rear thigh/buttocks). Apply every 8 hours as needed for for moisture-associated skin damage (right rear thigh/buttocks) and at bedtime (SCHEDULED)     carvedilol (COREG) 6.25 MG tablet Take 6.25 mg by mouth daily. SBP <100 and or HR <80     cetirizine (ZYRTEC) 10 MG tablet Take 10 mg by mouth daily.     Cranberry 500 MG TABS Take 500 mg by mouth daily.     dextromethorphan-guaiFENesin (MUCINEX DM) 30-600 MG per 12 hr tablet Take 1 tablet by mouth every 12 (twelve) hours as needed for cough.     docusate sodium (COLACE) 100 MG capsule Take 200 mg by mouth at bedtime.     ezetimibe (ZETIA) 10 MG tablet Take 10 mg by mouth at bedtime.      famotidine (PEPCID) 20 MG tablet Take 20 mg by mouth 2 (two) times daily.     ferrous sulfate 325 (65 FE) MG EC tablet Take 325 mg by mouth 3 (three) times daily with meals.     fluticasone (FLONASE) 50 MCG/ACT nasal spray Place 2 sprays into both nostrils in the morning.     furosemide (LASIX) 20 MG tablet Take 20 mg by mouth daily.     gabapentin (NEURONTIN) 300 MG capsule Take 300 mg by mouth 3 (three) times daily.     ipratropium-albuterol (DUONEB) 0.5-2.5 (3) MG/3ML  SOLN Take 3 mLs by nebulization every 6 (six) hours as needed (shortness of breath). 360 mL 2   lisinopril (ZESTRIL) 2.5 MG tablet Take 1.25 mg by mouth daily.     Magnesium Oxide -Mg Supplement (MAG-OXIDE) 200 MG TABS Take 400 mg by mouth 2 (two) times daily.     mirabegron ER (MYRBETRIQ) 25 MG TB24 tablet Take 25 mg by mouth in the morning.     Nutritional Supplements (ARGINAID) PACK Take 1 packet by mouth daily.     olopatadine (PATADAY) 0.1 %  ophthalmic solution Place 1 drop into both eyes every 6 (six) hours as needed for allergies.     polyethylene glycol (MIRALAX / GLYCOLAX) 17 g packet Take 17 g by mouth daily as needed for moderate constipation. 14 each 0   potassium chloride (KLOR-CON) 10 MEQ tablet Take 10 mEq by mouth daily. Do not crush     rizatriptan (MAXALT) 10 MG tablet Take 10 mg by mouth daily as needed for migraine.     Saccharomyces boulardii (PROBIOTIC) 250 MG CAPS Take 250 mg by mouth daily.     sulfamethoxazole-trimethoprim (BACTRIM DS) 800-160 MG tablet Take 1 tablet by mouth 2 (two) times daily. 14 tablet 0   SYNTHROID 200 MCG tablet Take 200 mcg by mouth daily before breakfast.     tamsulosin (FLOMAX) 0.4 MG CAPS capsule Take 1 capsule (0.4 mg total) by mouth daily after supper. (Patient taking differently: Take 0.4 mg by mouth every evening.) 30 capsule 3   topiramate (TOPAMAX) 50 MG tablet Take 50 mg by mouth 2 (two) times daily.     vitamin C (ASCORBIC ACID) 500 MG tablet Take 500 mg by mouth 2 (two) times daily.     Vitamin D, Ergocalciferol, (DRISDOL) 1.25 MG (50000 UNIT) CAPS capsule Take 50,000 Units by mouth every Saturday.     Current Facility-Administered Medications  Medication Dose Route Frequency Provider Last Rate Last Admin   ciprofloxacin (CIPRO) tablet 500 mg  500 mg Oral Once McKenzie, Mardene Celeste, MD        Allergies: Allergies  Allergen Reactions   Codeine Nausea Only and Other (See Comments)    "Allergic," per facility's paperwork   Invanz [Ertapenem] Other (See Comments)    "Allergic," per facility's paperwork   Propranolol Hcl Other (See Comments)    "Allergic," per facility's paperwork   Rocephin [Ceftriaxone] Hives and Other (See Comments)    "Allergic," per facility's paperwork   Vancomycin Hives and Other (See Comments)    "Allergic," per facility's paperwork    Past Medical History:  Diagnosis Date   Arthritis    Bipolar 1 disorder (HCC)    COPD with asthma    Diabetes  mellitus without complication (HCC)    Dyspnea    GERD (gastroesophageal reflux disease)    Hypertension    Obesity    Osteoarthritis    Paraplegic spinal paralysis (HCC)    waist down   Pyelonephritis    Thyroid disease    Past Surgical History:  Procedure Laterality Date   ABDOMINAL HYSTERECTOMY     BACK SURGERY     x 4   BUNIONECTOMY Left    CARPAL TUNNEL RELEASE Bilateral    CYSTOSCOPY Right 07/21/2022   Procedure: CYSTOSCOPY;  Surgeon: Malen Gauze, MD;  Location: [facility];  Service: Urology;  Laterality: Right;   IR EXT NEPHROURETERAL CATH EXCHANGE  08/24/2022   IR NEPHROSTOMY EXCHANGE LEFT  02/16/2022   IR NEPHROSTOMY EXCHANGE RIGHT  03/10/2022  IR NEPHROSTOMY EXCHANGE RIGHT  06/16/2022   IR NEPHROSTOMY PLACEMENT RIGHT  12/24/2021   IR NEPHROSTOMY PLACEMENT RIGHT  06/17/2022   IR NEPHROURETERAL CATH PLACE RIGHT  03/25/2022   NEPHROLITHOTOMY Right 09/15/2022   Procedure: NEPHROLITHOTOMY PERCUTANEOUS- already has tube;  Surgeon: Malen Gauze, MD;  Location: [facility];  Service: Urology;  Laterality: Right;  pt can not come in the AM - Dr. Requests PM surgery time   Family History  Adopted: Yes  Problem Relation Age of Onset   Heart disease Mother    Social History   Socioeconomic History   Marital status: Single    Spouse name: Not on file   Number of children: Not on file   Years of education: Not on file   Highest education level: Not on file  Occupational History   Not on file  Tobacco Use   Smoking status: Former   Smokeless tobacco: Never  Substance and Sexual Activity   Alcohol use: Not Currently    Alcohol/week: 0.0 standard drinks of alcohol    Comment: "weekend drinker, Saturday night drunk" years ago   Drug use: No   Sexual activity: Never  Other Topics Concern   Not on file  Social History Narrative   Not on file   Social Determinants of Health   Financial Resource Strain: Not on file  Food Insecurity: No Food Insecurity  (06/17/2022)   Hunger Vital Sign    Worried About Running Out of Food in the Last Year: Never true    Ran Out of Food in the Last Year: Never true  Transportation Needs: No Transportation Needs (06/17/2022)   PRAPARE - Administrator, Civil Service (Medical): No    Lack of Transportation (Non-Medical): No  Physical Activity: Not on file  Stress: Not on file  Social Connections: Not on file  Intimate Partner Violence: Not At Risk (06/17/2022)   Humiliation, Afraid, Rape, and Kick questionnaire    Fear of Current or Ex-Partner: No    Emotionally Abused: No    Physically Abused: No    Sexually Abused: No    SUBJECTIVE  Review of Systems*** Constitutional: Patient denies any unintentional weight loss or change in strength lntegumentary: Patient denies any rashes or pruritus Eyes: Patient denies ***dry eyes ENT: Patient ***denies dry mouth Cardiovascular: Patient denies chest pain or syncope Respiratory: Patient denies shortness of breath Gastrointestinal: Patient ***denies nausea, vomiting, constipation, or diarrhea Musculoskeletal: Patient denies muscle cramps or weakness Neurologic: Patient denies convulsions or seizures Allergic/Immunologic: Patient denies recent allergic reaction(s) Hematologic/Lymphatic: Patient denies bleeding tendencies Endocrine: Patient denies heat/cold intolerance  GU: As per HPI.  OBJECTIVE There were no vitals filed for this visit. There is no height or weight on file to calculate BMI.  Physical Examination*** Constitutional: No obvious distress; patient is non-toxic appearing  Cardiovascular: No visible lower extremity edema.  Respiratory: The patient does not have audible wheezing/stridor; respirations do not appear labored  Gastrointestinal: Abdomen non-distended Musculoskeletal: Normal ROM of UEs  Skin: No obvious rashes/open sores  Neurologic: CN 2-12 grossly intact Psychiatric: Answered questions appropriately with normal affect   Hematologic/Lymphatic/Immunologic: No obvious bruises or sites of spontaneous bleeding  UA: ***negative *** WBC/hpf, *** RBC/hpf, bacteria (***) PVR: *** ml  ASSESSMENT No diagnosis found.  ***We reviewed recent imaging results; ***awaiting radiology results, appears to have ***no acute findings.  ***For stone prevention: Advised adequate hydration and we discussed option to consider low oxalate diet given that calcium oxalate is the most common  type of stone. Handout provided about stone prevention diet.  ***For recurrent stone formers: We discussed option to proceed with 24 hour urinalysis ([facility]) for metabolic evaluation, which may help with targeted recommendations for dietary I medication therapies for stone prevention. Patient elected to ***proceed/ ***hold off.  Will plan to follow up in ***6 months / ***1 year with ***KUB ***RUS for stone surveillance or sooner if needed.  Pt verbalized understanding and agreement. All questions were answered.  PLAN Advised the following: Maintain adequate fluid intake daily. Drink citrus juice (lemon, lime or orange juice) routinely. Low oxalate diet. No follow-ups on file.  No orders of the defined types were placed in this encounter.   It has been explained that the patient is to follow regularly with their PCP in addition to all other providers involved in their care and to follow instructions provided by these respective offices. Patient advised to contact urology clinic if any urologic-pertaining questions, concerns, new symptoms or problems arise in the interim period.  There are no Patient Instructions on file for this visit.  Electronically signed by:  Donnita Falls, MSN, FNP-C, CUNP 12/28/2022 11:58 AM

## 2022-12-28 NOTE — Telephone Encounter (Signed)
-----   Message from Donnita Falls sent at 12/28/2022 12:05 PM EDT ----- Please contact patient's SNF [facility]) to request any recent BMP / CMP lab results within the past 2 months. Thanks!

## 2023-01-02 ENCOUNTER — Ambulatory Visit: Payer: Medicare Other | Admitting: Urology

## 2023-01-02 DIAGNOSIS — N319 Neuromuscular dysfunction of bladder, unspecified: Secondary | ICD-10-CM

## 2023-01-02 DIAGNOSIS — N200 Calculus of kidney: Secondary | ICD-10-CM

## 2023-01-04 ENCOUNTER — Telehealth: Payer: Self-pay

## 2023-01-04 NOTE — Telephone Encounter (Signed)
-----   Message from Donnita Falls sent at 01/02/2023 12:41 PM EDT ----- Please let patient know that I received labs from her SNF and her renal function was normal on 12/20/2022.

## 2023-01-04 NOTE — Telephone Encounter (Signed)
Tried calling patient with no answer.

## 2023-01-04 NOTE — Telephone Encounter (Signed)
Tried calling SNF with no answer.

## 2023-01-12 DIAGNOSIS — R339 Retention of urine, unspecified: Secondary | ICD-10-CM | POA: Insufficient documentation

## 2023-01-12 NOTE — Progress Notes (Signed)
Name: [name] DOB: 1945-07-06 MRN: [id number]  History of Present Illness: [name] is a 77 y.o. female who presents today for follow up visit at [facility]. She is bed-bound due to paraplegia. - GU History: 1. Neurogenic bladder with urinary retention. Managed with chronic indwelling Foley catheter for the past 17-18 years.  2. Kidney stones. 3. Prior episode of pyelonephritis. 4. Multiple renal cysts.  Recent history: > 09/15/2022: Underwent right PCNL with placement of right ureteral stent and right nephrostomy tube.  > 09/19/2022: Seen by Dr. Ronne Binning. Nephrostomy stent removed.  > 09/28/2022: Seen by Dr. Ronne Binning. Ureteral stent removed.  Since last visit: > 10/28/2022: Renal US showed bilateral renal cysts; no GU stones, masses, or hydronephrosis.  > 12/20/2022:  - Normal renal function (creatinine 0.65, GFR 92). - No leukocytosis.  Today: She reports constant urine leakage around her 22 fr Foley. She states the SNF staff tried going up to a 26 fr but there was no improvement that. She reports that the Foley catheter has even come out before with the balloon fully inflated (even with 30 ml in the balloon). She denies any wounds / pressure ulcers currently but states the leakage is annoying.   She states she has previously discussed SP tube placement as an alternative but that she was told by multiple urologists that is not an option due to her body habitus with large pannus due to concerns for kinking / occlusion of the SP tube and abdominal skin breakdown. 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  Denies any systemic symptoms suggestive of acute UTI - denies fevers, nausea, or vomiting.   Reports right side wall "sensation"; denies pain or pressure. Denies gross hematuria.    Medications: Current  Outpatient Medications  Medication Sig Dispense Refill   acetaminophen (TYLENOL) 325 MG tablet Take 1 tablet (325 mg total) by mouth every 6 (six) hours as needed for mild pain, moderate pain, fever or headache. *May take one every 6 hours as needed for pain (Patient taking differently: Take 650 mg by mouth every 6 (six) hours.)     ARTIFICIAL TEARS 1.4 % ophthalmic solution Place 1 drop into both eyes every 12 (twelve) hours.     atorvastatin (LIPITOR) 10 MG tablet Take 10 mg by mouth every evening.     azelastine (ASTELIN) 0.1 % nasal spray Place 2 sprays into both nostrils 2 (two) times daily.     b complex vitamins tablet Take 1 tablet by mouth daily.     Calcium Carb-Cholecalciferol (CALCIUM CARBONATE-VITAMIN D3 PO) Take 1 tablet by mouth 2 (two) times daily.     CALMOSEPTINE 0.44-20.6 % OINT Apply 1 application  topically every 8 (eight) hours as needed (for moisture-associated skin damage- right rear thigh/buttocks). Apply every 8 hours as needed for for moisture-associated skin damage (right rear thigh/buttocks) and at bedtime (SCHEDULED)     carvedilol (COREG) 6.25 MG tablet Take 6.25 mg by mouth daily. SBP <100 and or HR <80     cetirizine (ZYRTEC) 10 MG tablet Take 10 mg by mouth daily.     Cranberry 500 MG TABS Take 500 mg by mouth daily.     dextromethorphan-guaiFENesin (MUCINEX DM) 30-600 MG per 12 hr tablet Take 1 tablet by mouth every 12 (twelve) hours as needed for cough.  docusate sodium (COLACE) 100 MG capsule Take 200 mg by mouth at bedtime.     ezetimibe (ZETIA) 10 MG tablet Take 10 mg by mouth at bedtime.      famotidine (PEPCID) 20 MG tablet Take 20 mg by mouth 2 (two) times daily.     ferrous sulfate 325 (65 FE) MG EC tablet Take 325 mg by mouth 3 (three) times daily with meals.     fluticasone (FLONASE) 50 MCG/ACT nasal spray Place 2 sprays into both nostrils in the morning.     furosemide (LASIX) 20 MG tablet Take 20 mg by mouth daily.     gabapentin (NEURONTIN) 300 MG  capsule Take 300 mg by mouth 3 (three) times daily.     ipratropium-albuterol (DUONEB) 0.5-2.5 (3) MG/3ML SOLN Take 3 mLs by nebulization every 6 (six) hours as needed (shortness of breath). 360 mL 2   lisinopril (ZESTRIL) 2.5 MG tablet Take 1.25 mg by mouth daily.     Magnesium Oxide -Mg Supplement (MAG-OXIDE) 200 MG TABS Take 400 mg by mouth 2 (two) times daily.     mirabegron ER (MYRBETRIQ) 25 MG TB24 tablet Take 25 mg by mouth in the morning.     Nutritional Supplements (ARGINAID) PACK Take 1 packet by mouth daily.     olopatadine (PATADAY) 0.1 % ophthalmic solution Place 1 drop into both eyes every 6 (six) hours as needed for allergies.     polyethylene glycol (MIRALAX / GLYCOLAX) 17 g packet Take 17 g by mouth daily as needed for moderate constipation. 14 each 0   potassium chloride (KLOR-CON) 10 MEQ tablet Take 10 mEq by mouth daily. Do not crush     rizatriptan (MAXALT) 10 MG tablet Take 10 mg by mouth daily as needed for migraine.     Saccharomyces boulardii (PROBIOTIC) 250 MG CAPS Take 250 mg by mouth daily.     sulfamethoxazole-trimethoprim (BACTRIM DS) 800-160 MG tablet Take 1 tablet by mouth 2 (two) times daily. 14 tablet 0   SYNTHROID 200 MCG tablet Take 200 mcg by mouth daily before breakfast.     tamsulosin (FLOMAX) 0.4 MG CAPS capsule Take 1 capsule (0.4 mg total) by mouth daily after supper. (Patient taking differently: Take 0.4 mg by mouth every evening.) 30 capsule 3   topiramate (TOPAMAX) 50 MG tablet Take 50 mg by mouth 2 (two) times daily.     vitamin C (ASCORBIC ACID) 500 MG tablet Take 500 mg by mouth 2 (two) times daily.     Vitamin D, Ergocalciferol, (DRISDOL) 1.25 MG (50000 UNIT) CAPS capsule Take 50,000 Units by mouth every Saturday.     Current Facility-Administered Medications  Medication Dose Route Frequency Provider Last Rate Last Admin   ciprofloxacin (CIPRO) tablet 500 mg  500 mg Oral Once McKenzie, Mardene Celeste, MD        Allergies: Allergies  Allergen  Reactions   Codeine Nausea Only and Other (See Comments)    "Allergic," per facility's paperwork   Invanz [Ertapenem] Other (See Comments)    "Allergic," per facility's paperwork   Propranolol Hcl Other (See Comments)    "Allergic," per facility's paperwork   Rocephin [Ceftriaxone] Hives and Other (See Comments)    "Allergic," per facility's paperwork   Vancomycin Hives and Other (See Comments)    "Allergic," per facility's paperwork    Past Medical History:  Diagnosis Date   Arthritis    Bipolar 1 disorder (HCC)    COPD with asthma (HCC)    Diabetes mellitus without  complication (HCC)    Dyspnea    GERD (gastroesophageal reflux disease)    Hypertension    Obesity    Osteoarthritis    Paraplegic spinal paralysis (HCC)    waist down   Pyelonephritis    Thyroid disease    Past Surgical History:  Procedure Laterality Date   ABDOMINAL HYSTERECTOMY     BACK SURGERY     x 4   BUNIONECTOMY Left    CARPAL TUNNEL RELEASE Bilateral    CYSTOSCOPY Right 07/21/2022   Procedure: CYSTOSCOPY;  Surgeon: Malen Gauze, MD;  Location: [facility];  Service: Urology;  Laterality: Right;   IR EXT NEPHROURETERAL CATH EXCHANGE  08/24/2022   IR NEPHROSTOMY EXCHANGE LEFT  02/16/2022   IR NEPHROSTOMY EXCHANGE RIGHT  03/10/2022   IR NEPHROSTOMY EXCHANGE RIGHT  06/16/2022   IR NEPHROSTOMY PLACEMENT RIGHT  12/24/2021   IR NEPHROSTOMY PLACEMENT RIGHT  06/17/2022   IR NEPHROURETERAL CATH PLACE RIGHT  03/25/2022   NEPHROLITHOTOMY Right 09/15/2022   Procedure: NEPHROLITHOTOMY PERCUTANEOUS- already has tube;  Surgeon: Malen Gauze, MD;  Location: [facility];  Service: Urology;  Laterality: Right;  pt can not come in the AM - Dr. Requests PM surgery time   Family History  Adopted: Yes  Problem Relation Age of Onset   Heart disease Mother    Social History   Socioeconomic History   Marital status: Single    Spouse name: Not on file   Number of children: Not on file   Years of education: Not  on file   Highest education level: Not on file  Occupational History   Not on file  Tobacco Use   Smoking status: Former   Smokeless tobacco: Never  Substance and Sexual Activity   Alcohol use: Not Currently    Alcohol/week: 0.0 standard drinks of alcohol    Comment: "weekend drinker, Saturday night drunk" years ago   Drug use: No   Sexual activity: Never  Other Topics Concern   Not on file  Social History Narrative   Not on file   Social Determinants of Health   Financial Resource Strain: Not on file  Food Insecurity: No Food Insecurity (06/17/2022)   Hunger Vital Sign    Worried About Running Out of Food in the Last Year: Never true    Ran Out of Food in the Last Year: Never true  Transportation Needs: No Transportation Needs (06/17/2022)   PRAPARE - Administrator, Civil Service (Medical): No    Lack of Transportation (Non-Medical): No  Physical Activity: Not on file  Stress: Not on file  Social Connections: Not on file  Intimate Partner Violence: Not At Risk (06/17/2022)   Humiliation, Afraid, Rape, and Kick questionnaire    Fear of Current or Ex-Partner: No    Emotionally Abused: No    Physically Abused: No    Sexually Abused: No    SUBJECTIVE  Review of Systems Constitutional: Patient denies any unintentional weight loss or change in strength lntegumentary: Patient denies any rashes, pruritus, pressure wounds / ulcers, skin erosion Cardiovascular: Patient denies chest pain or syncope Respiratory: Patient denies shortness of breath Gastrointestinal: Patient denies nausea, vomiting, constipation, or diarrhea Musculoskeletal: Patient denies muscle cramps or weakness Allergic/Immunologic: Patient denies recent allergic reaction(s) Hematologic/Lymphatic: Patient denies bleeding tendencies Endocrine: Patient denies heat/cold intolerance  GU: As per HPI.  OBJECTIVE Vitals:   01/19/23 1433  BP: 116/63  Pulse: 67  Temp: 98.6 F (37 C)   There is no  height or weight on file to calculate BMI.  Physical Examination Constitutional: No obvious distress; patient is non-toxic appearing  Cardiovascular: No visible lower extremity edema.  Respiratory: The patient does not have audible wheezing/stridor; respirations do not appear labored  Gastrointestinal: Abdomen non-distended Musculoskeletal: Normal ROM of UEs  Skin: No obvious rashes/open sores  Neurologic: CN 2-12 grossly intact Psychiatric: Answered questions appropriately with normal affect  Hematologic/Lymphatic/Immunologic: No obvious bruises or sites of spontaneous bleeding  GU: Catheter draining clear yellow urine.  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 Discussed renal cysts which are likely benign. Unclear etiology of right side wall "sensation" - offered to assess with repeat imaging now but she declined. Agreed to repeat RUS in 6 months for surveillance of cysts and stones. No need for Flomax at this time - can be discontinued.  No evidence of UTI at this time. Pt was advised that their urine will always appear infected on UA due to indwelling catheter and that we would not advise antibiotic treatment unless systemic symptoms are present (such as new onset or worsening of fever, rigors, altered mental status, malaise, or lethargy with no other identified cause; flank pain; CVA tenderness). We discussed antibiotic stewardship and goal to minimize risk for developing antibiotic resistance. From a urology standpoint no need for ongoing daily Bactrim use; can discontinue unless there are non-urologic indications for that.   Advised to continue routine catheter exchanges at least once every 4 weeks  at SNF to ensure bladder emptying. We discussed her constant urine leakage around the indwelling Foley catheter due to urethral sphincter erosion. Unfortunately there may be no options for correcting that issue, however will consult with Dr. Ronne Binning and let her know if he has any suggestions. She is aware that upsizing the catheter will not help and was informed that surgical intervention would likely fail.   Will plan to follow up in 6 months. Pt verbalized understanding and agreement. All questions were answered.   PLAN Advised the following: Continue indwelling Foley catheter.  Can discontinue daily Bactrim unless there are non-urologic indications for that.  Can discontinue daily Flomax.  Return in about 6 months (around 07/20/2023) for f/u with Dr. Ronne Binning with RUS prior.  Orders Placed This Encounter  Procedures   US RENAL    Standing Status:   Future    Standing Expiration Date:   01/19/2024    Order Specific Question:   Reason for Exam (SYMPTOM  OR DIAGNOSIS REQUIRED)    Answer:   kidney stone known or suspected    Order Specific Question:   Preferred imaging location?    Answer:   [facility]   Total time spent caring for the patient today was over 30 minutes. This includes time spent on the date of the visit reviewing the patient's chart before the visit, time spent during the visit, and time spent after the visit on documentation. Over 50% of that time was spent in face-to-face time with this patient for direct counseling. E&M based on time and complexity of medical decision making.  It has been explained that the patient is to follow regularly with their PCP in addition to all other providers involved in their care and to follow instructions provided by these respective  offices. Patient advised to contact urology clinic if any urologic-pertaining questions, concerns, new symptoms or problems arise in the interim period.  There are no Patient Instructions on file for  this visit.  Electronically signed by:  Donnita Falls, MSN, FNP-C, CUNP 01/19/2023 4:44 PM

## 2023-01-13 ENCOUNTER — Ambulatory Visit: Payer: Medicare Other | Admitting: Urology

## 2023-01-19 ENCOUNTER — Ambulatory Visit: Payer: Medicare Other | Admitting: Urology

## 2023-01-19 ENCOUNTER — Encounter: Payer: Self-pay | Admitting: Urology

## 2023-01-19 VITALS — BP 116/63 | HR 67 | Temp 98.6°F

## 2023-01-19 DIAGNOSIS — Z978 Presence of other specified devices: Secondary | ICD-10-CM

## 2023-01-19 DIAGNOSIS — N368 Other specified disorders of urethra: Secondary | ICD-10-CM

## 2023-01-19 DIAGNOSIS — G8220 Paraplegia, unspecified: Secondary | ICD-10-CM

## 2023-01-19 DIAGNOSIS — R339 Retention of urine, unspecified: Secondary | ICD-10-CM

## 2023-01-19 DIAGNOSIS — N3945 Continuous leakage: Secondary | ICD-10-CM

## 2023-01-19 DIAGNOSIS — N200 Calculus of kidney: Secondary | ICD-10-CM

## 2023-01-19 DIAGNOSIS — T8389XD Other specified complication of genitourinary prosthetic devices, implants and grafts, subsequent encounter: Secondary | ICD-10-CM

## 2023-01-19 DIAGNOSIS — N319 Neuromuscular dysfunction of bladder, unspecified: Secondary | ICD-10-CM

## 2023-01-19 DIAGNOSIS — Q6102 Congenital multiple renal cysts: Secondary | ICD-10-CM

## 2023-01-24 ENCOUNTER — Telehealth: Payer: Self-pay

## 2023-01-24 NOTE — Telephone Encounter (Signed)
Office note faxed per NP request Release: [id number]

## 2023-01-24 NOTE — Telephone Encounter (Signed)
-----   Message from Donnita Falls sent at 01/23/2023  8:53 AM EDT ----- Please let patient know that Dr. Ronne Binning was consulted as we discussed and he confirmed that unfortunately there are no viable treatment options for her urine leakage around her indwelling Foley catheter (due to urethral sphincter erosion) at this point, including bulking injections around the urethral sphincter. ----- Message ----- From: Donnita Falls, FNP Sent: 01/19/2023   4:44 PM EDT To: Donnita Falls, FNP  Sent to Dr. Ronne Binning 01/19/23:  She would like your opinion on whether anything can be done for her constant urine leakage around her indwelling Foley catheter due to urethral sphincter erosion. I told her it's unlikely but that perhaps Bulkamid injections around the urethral sphincter could be considered (like how they use it for stress urinary incontinence). She's been told by multiple urologists that she's not a candidate for SP tube & that it wouldn't fix the urethra leakage. Thoughts?

## 2023-01-24 NOTE — Telephone Encounter (Signed)
Unable to speak with patient, letter sent informing pt of NP response.

## 2023-01-24 NOTE — Telephone Encounter (Signed)
-----   Message from Donnita Falls sent at 01/23/2023  8:55 AM EDT ----- Please fax visit note from 01/19/2023 to her SNF. Thank you

## 2023-01-31 ENCOUNTER — Telehealth: Payer: Self-pay

## 2023-01-31 NOTE — Telephone Encounter (Signed)
I left a voicemail on Belinda's secure voicemail informing her of Sarah's response and clarifying the injections per our original conversation.  I gave her my personal call back and informed her to call back with any questions.

## 2023-01-31 NOTE — Telephone Encounter (Signed)
Belinda called from [facility], she wanted clarification on the injections.  I explained this was an option recommended by MD that this was not something that needed to be scheduled however, if the pt was interested she could discuss this further with provider.  [place] states that the facility d/c'd patient catheter on 10/31.  With any questions regarding catheter removal we need to call [phone] and ask for [facility].

## 2023-07-18 ENCOUNTER — Ambulatory Visit (HOSPITAL_COMMUNITY): Payer: Medicare Other

## 2023-07-20 ENCOUNTER — Ambulatory Visit: Payer: Medicare Other | Admitting: Urology

## 2023-07-31 ENCOUNTER — Ambulatory Visit (HOSPITAL_COMMUNITY)
Admission: RE | Admit: 2023-07-31 | Discharge: 2023-07-31 | Disposition: A | Discharge status: Home / Self Care | Source: Ambulatory Visit | Attending: Urology | Admitting: Urology

## 2023-07-31 DIAGNOSIS — N200 Calculus of kidney: Secondary | ICD-10-CM | POA: Insufficient documentation

## 2023-08-02 ENCOUNTER — Telehealth: Payer: Self-pay

## 2023-08-02 NOTE — Telephone Encounter (Signed)
-----   Message from Lauretta Ponto sent at 08/02/2023  8:43 AM EDT ----- Please let pt know RUS was "Technically challenging examination due to patient body habitus" but as far as the radiologist could tell there were no acute findings. Stable renal cysts. No stones, masses, or hydronephrosis. Thanks.

## 2023-08-02 NOTE — Telephone Encounter (Signed)
 Tried calling Patient's nurse at [facility] with no answer.

## 2023-08-04 NOTE — Telephone Encounter (Signed)
 Tried calling [facility] to relay NP response to patient's nurse with no answer.

## 2023-08-04 NOTE — Telephone Encounter (Signed)
-----   Message from Lauretta Ponto sent at 08/02/2023  8:43 AM EDT ----- Please let pt know RUS was "Technically challenging examination due to patient body habitus" but as far as the radiologist could tell there were no acute findings. Stable renal cysts. No stones, masses, or hydronephrosis. Thanks.

## 2023-08-07 NOTE — Telephone Encounter (Signed)
 Tried calling [facility] to relay NP response to patient's nurse with no answer multiple times. Letter mailed out.

## 2023-08-15 ENCOUNTER — Ambulatory Visit: Admitting: Urology

## 2024-01-16 ENCOUNTER — Ambulatory Visit: Admitting: Urology

## 2024-02-05 ENCOUNTER — Telehealth: Payer: Self-pay

## 2024-02-05 NOTE — Telephone Encounter (Signed)
 Hospital follow up appointment needed case manager advised the hospital encounter will be sent to MD for his advisement on follow up

## 2024-02-05 NOTE — Telephone Encounter (Signed)
 Kasey with [facility] is calling to make a hospital follow up with Dr. Sherrilee for patient being discharged.  Call Josephine at [phone]

## 2024-02-06 NOTE — Telephone Encounter (Signed)
 Called pt facility to let them know Dx of pt and confirm upcoming appt no answer lvm for someone to return the phone call

## 2024-04-08 ENCOUNTER — Ambulatory Visit: Admitting: Urology

## 2024-04-08 VITALS — BP 119/77 | HR 63

## 2024-04-08 DIAGNOSIS — N200 Calculus of kidney: Secondary | ICD-10-CM | POA: Diagnosis not present

## 2024-04-08 DIAGNOSIS — Z960 Presence of urogenital implants: Secondary | ICD-10-CM | POA: Diagnosis not present

## 2024-04-08 NOTE — Progress Notes (Unsigned)
 "  04/08/2024 11:45 AM   [name] [birth year] [id number]  Referring provider: Wilhelmina Cedar [address] [place],  [place] [address]  Followup nephrolithiasis   HPI: [name] is a 25bn here for followup for nephrolithiasis. No stone events since last visit. She denies any flank pain. Renal US  07/2023 shows no calculi and no hydronephrosis. Her bladder is managed with an indwelling foley.  Her catheter is changed every 1-2 weeks due to sediment. She drinks 48-64 oz of water  daily.    PMH: Past Medical History:  Diagnosis Date   Arthritis    Bipolar 1 disorder (HCC)    COPD with asthma (HCC)    Diabetes mellitus without complication (HCC)    Dyspnea    GERD (gastroesophageal reflux disease)    Hypertension    Obesity    Osteoarthritis    Paraplegic spinal paralysis (HCC)    waist down   Pyelonephritis    Thyroid  disease     Surgical History: Past Surgical History:  Procedure Laterality Date   ABDOMINAL HYSTERECTOMY     BACK SURGERY     x 4   BUNIONECTOMY Left    CARPAL TUNNEL RELEASE Bilateral    CYSTOSCOPY Right 07/21/2022   Procedure: CYSTOSCOPY;  Surgeon: Sherrilee Belvie CROME, MD;  Location: [facility];  Service: Urology;  Laterality: Right;   IR EXT NEPHROURETERAL CATH EXCHANGE  08/24/2022   IR NEPHROSTOMY EXCHANGE LEFT  02/16/2022   IR NEPHROSTOMY EXCHANGE RIGHT  03/10/2022   IR NEPHROSTOMY EXCHANGE RIGHT  06/16/2022   IR NEPHROSTOMY PLACEMENT RIGHT  12/24/2021   IR NEPHROSTOMY PLACEMENT RIGHT  06/17/2022   IR NEPHROURETERAL CATH PLACE RIGHT  03/25/2022   NEPHROLITHOTOMY Right 09/15/2022   Procedure: NEPHROLITHOTOMY PERCUTANEOUS- already has tube;  Surgeon: Sherrilee Belvie CROME, MD;  Location: [facility];  Service: Urology;  Laterality: Right;  pt can not come in the AM - Dr. Requests PM surgery time    Home Medications:  Allergies as of 04/08/2024       Reactions   Codeine Nausea Only, Other (See Comments)   Allergic, per facility's paperwork   Invanz   [ertapenem ] Other (See Comments)   Allergic, per facility's paperwork   Propranolol Hcl Other (See Comments)   Allergic, per facility's paperwork   Rocephin  [ceftriaxone ] Hives, Other (See Comments)   Allergic, per facility's paperwork   Vancomycin  Hives, Other (See Comments)   Allergic, per facility's paperwork        Medication List        Accurate as of April 08, 2024 11:45 AM. If you have any questions, ask your nurse or doctor.          acetaminophen  325 MG tablet Commonly known as: TYLENOL  Take 1 tablet (325 mg total) by mouth every 6 (six) hours as needed for mild pain, moderate pain, fever or headache. *May take one every 6 hours as needed for pain What changed:  how much to take when to take this additional instructions   Arginaid Pack Take 1 packet by mouth daily.   Artificial Tears 1.4 % ophthalmic solution Generic drug: artificial tears Place 1 drop into both eyes every 12 (twelve) hours.   ascorbic acid 500 MG tablet Commonly known as: VITAMIN C  Take 500 mg by mouth 2 (two) times daily.   atorvastatin  10 MG tablet Commonly known as: LIPITOR Take 10 mg by mouth every evening.   azelastine  0.1 % nasal spray Commonly known as: ASTELIN  Place 2 sprays into both nostrils  2 (two) times daily.   b complex vitamins tablet Take 1 tablet by mouth daily.   CALCIUM  CARBONATE-VITAMIN D3 PO Take 1 tablet by mouth 2 (two) times daily.   Calmoseptine 0.44-20.6 % Oint Generic drug: Menthol-Zinc Oxide Apply 1 application  topically every 8 (eight) hours as needed (for moisture-associated skin damage- right rear thigh/buttocks). Apply every 8 hours as needed for for moisture-associated skin damage (right rear thigh/buttocks) and at bedtime (SCHEDULED)   carvedilol  6.25 MG tablet Commonly known as: COREG  Take 6.25 mg by mouth daily. SBP <100 and or HR <80   cetirizine 10 MG tablet Commonly known as: ZYRTEC Take 10 mg by mouth daily.   Cranberry 500  MG Tabs Take 500 mg by mouth daily.   dextromethorphan -guaiFENesin  30-600 MG 12hr tablet Commonly known as: MUCINEX  DM Take 1 tablet by mouth every 12 (twelve) hours as needed for cough.   docusate sodium  100 MG capsule Commonly known as: COLACE Take 200 mg by mouth at bedtime.   ezetimibe  10 MG tablet Commonly known as: ZETIA  Take 10 mg by mouth at bedtime.   famotidine  20 MG tablet Commonly known as: PEPCID  Take 20 mg by mouth 2 (two) times daily.   ferrous sulfate  325 (65 FE) MG EC tablet Take 325 mg by mouth 3 (three) times daily with meals.   fluticasone  50 MCG/ACT nasal spray Commonly known as: FLONASE  Place 2 sprays into both nostrils in the morning.   furosemide  20 MG tablet Commonly known as: LASIX  Take 20 mg by mouth daily.   gabapentin  300 MG capsule Commonly known as: NEURONTIN  Take 300 mg by mouth 3 (three) times daily.   ipratropium-albuterol  0.5-2.5 (3) MG/3ML Soln Commonly known as: DUONEB Take 3 mLs by nebulization every 6 (six) hours as needed (shortness of breath).   lisinopril  2.5 MG tablet Commonly known as: ZESTRIL  Take 1.25 mg by mouth daily.   Mag-Oxide 200 MG Tabs Generic drug: Magnesium  Oxide -Mg Supplement Take 400 mg by mouth 2 (two) times daily.   mirabegron  ER 25 MG Tb24 tablet Commonly known as: MYRBETRIQ  Take 25 mg by mouth in the morning.   Pataday 0.1 % ophthalmic solution Generic drug: olopatadine Place 1 drop into both eyes every 6 (six) hours as needed for allergies.   polyethylene glycol 17 g packet Commonly known as: MIRALAX  / GLYCOLAX  Take 17 g by mouth daily as needed for moderate constipation.   potassium chloride  10 MEQ tablet Commonly known as: KLOR-CON  Take 10 mEq by mouth daily. Do not crush   Probiotic 250 MG Caps Take 250 mg by mouth daily.   rizatriptan 10 MG tablet Commonly known as: MAXALT Take 10 mg by mouth daily as needed for migraine.   sulfamethoxazole -trimethoprim  800-160 MG tablet Commonly  known as: BACTRIM  DS Take 1 tablet by mouth 2 (two) times daily.   Synthroid  200 MCG tablet Generic drug: levothyroxine  Take 200 mcg by mouth daily before breakfast.   tamsulosin  0.4 MG Caps capsule Commonly known as: FLOMAX  Take 1 capsule (0.4 mg total) by mouth daily after supper. What changed: when to take this   topiramate  50 MG tablet Commonly known as: TOPAMAX  Take 50 mg by mouth 2 (two) times daily.   Vitamin D  (Ergocalciferol ) 1.25 MG (50000 UNIT) Caps capsule Commonly known as: DRISDOL Take 50,000 Units by mouth every Saturday.        Allergies: Allergies[1]  Family History: Family History  Adopted: Yes  Problem Relation Age of Onset   Heart disease Mother  Social History:  reports that she has quit smoking. She has never used smokeless tobacco. She reports that she does not currently use alcohol . She reports that she does not use drugs.  ROS: All other review of systems were reviewed and are negative except what is noted above in HPI  Physical Exam: BP 119/77   Pulse 63   Constitutional:  Alert and oriented, No acute distress. HEENT: [place] AT, moist mucus membranes.  Trachea midline, no masses. Cardiovascular: No clubbing, cyanosis, or edema. Respiratory: Normal respiratory effort, no increased work of breathing. GI: Abdomen is soft, nontender, nondistended, no abdominal masses GU: No CVA tenderness.  Lymph: No cervical or inguinal lymphadenopathy. Skin: No rashes, bruises or suspicious lesions. Neurologic: Grossly intact, no focal deficits, moving all 4 extremities. Psychiatric: Normal mood and affect.  Laboratory Data: Lab Results  Component Value Date   WBC 9.0 09/16/2022   HGB 10.8 (L) 09/16/2022   HCT 35.6 (L) 09/16/2022   MCV 89.0 09/16/2022   PLT 213 09/16/2022    Lab Results  Component Value Date   CREATININE 0.47 09/16/2022    No results found for: PSA  No results found for: TESTOSTERONE  Lab Results  Component Value Date    HGBA1C 6.0 (H) 11/15/2021    Urinalysis    Component Value Date/Time   COLORURINE YELLOW 12/24/2021 0244   APPEARANCEUR TURBID (A) 12/24/2021 0244   APPEARANCEUR Cloudy (A) 02/23/2021 1343   LABSPEC 1.016 12/24/2021 0244   PHURINE 5.0 12/24/2021 0244   GLUCOSEU NEGATIVE 12/24/2021 0244   HGBUR SMALL (A) 12/24/2021 0244   BILIRUBINUR NEGATIVE 12/24/2021 0244   BILIRUBINUR Negative 02/23/2021 1343   KETONESUR 5 (A) 12/24/2021 0244   PROTEINUR 100 (A) 12/24/2021 0244   UROBILINOGEN 0.2 09/08/2014 0436   NITRITE NEGATIVE 12/24/2021 0244   LEUKOCYTESUR LARGE (A) 12/24/2021 0244    Lab Results  Component Value Date   LABMICR See below: 02/23/2021   WBCUA 11-30 (A) 02/23/2021   LABEPIT 0-10 02/23/2021   MUCUS Present 02/23/2021   BACTERIA MANY (A) 12/24/2021    Pertinent Imaging: Renal US  07/2023: images reviewed and discussed with the patient No results found for this or any previous visit.  No results found for this or any previous visit.  No results found for this or any previous visit.  No results found for this or any previous visit.  Results for orders placed during the hospital encounter of 07/31/23  US  RENAL  Narrative CLINICAL DATA:  Nephrolithiasis  EXAM: RENAL / URINARY TRACT ULTRASOUND COMPLETE  COMPARISON:  Renal ultrasound examination dated 10/28/2022  FINDINGS: Right Kidney:  Length = 10.3 cm  Normal parenchymal echogenicity with preserved corticomedullary differentiation. Diffuse renal cortical thinning. Again seen are multiple simple cysts, measuring up to 7.7 x 5.3 x 5.1 cm in the upper pole, not substantially changed in size. No urinary tract dilation or shadowing calculi. The ureter is not seen.  Left Kidney:  Length = 10.5 cm  Normal parenchymal echogenicity with preserved corticomedullary differentiation. Previously noted cysts are not well seen. No urinary tract dilation or shadowing calculi. The ureter is not  seen.  Bladder:  Bladder is not well seen, likely related to underdistention.  Other:  Technically challenging examination due to patient body habitus.  IMPRESSION: 1. Technically challenging examination due to patient body habitus. Previously noted left renal cysts are not well seen. Multiple simple right renal cysts, not substantially changed in size. 2. No urinary tract dilation or shadowing calculi. 3. Bladder is  not well seen, likely related to underdistention.   Electronically Signed By: Limin  Xu M.D. On: 08/01/2023 16:12  No results found for this or any previous visit.  No results found for this or any previous visit.  Results for orders placed during the hospital encounter of 02/23/22  CT Renal Stone Study  Narrative CLINICAL DATA:  Nephrostomy tube catheter pulled out about 3 inches in a patient with large renal calculus.  EXAM: CT ABDOMEN AND PELVIS WITHOUT CONTRAST  TECHNIQUE: Multidetector CT imaging of the abdomen and pelvis was performed following the standard protocol without IV contrast.  RADIATION DOSE REDUCTION: This exam was performed according to the departmental dose-optimization program which includes automated exposure control, adjustment of the mA and/or kV according to patient size and/or use of iterative reconstruction technique.  COMPARISON:  January 31, 2022  FINDINGS: Lower chest: Slightly improved aeration at the RIGHT lung base still with patchy ground-glass nodularity, volume loss and consolidative changes. These are mild and improved since previous imaging. No pleural effusion. Mild LEFT basilar atelectasis.  Hepatobiliary: Nodular hepatic contour with signs of fissural widening. Sludge in the gallbladder. No pericholecystic stranding. No gross biliary duct dilation.  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 No sign of pancreatic inflammation.  Spleen: Mild splenomegaly unchanged.  Adrenals/Urinary Tract: Adrenal  glands are normal.  Marked perinephric stranding. Nephrostomy tube in place with marker for sideholes within the parenchyma of the interpolar RIGHT kidney, not extending beyond the renal parenchyma. Marker for sideholes with similar appearance in terms of positioning compared to previous imaging. Pigtail portion is coiled within lower pole collecting system elements similar to previous imaging. Large branch type calculus with lamellated appearance extends into the UPJ from the renal sinus measuring 3.7 x 2.7 cm similar to prior imaging. Renal cysts and marked RIGHT renal cortical atrophy are similar to prior imaging as well.  LEFT renal lesions are unchanged including an intermediate density lesion arising from the lower pole measuring approximately 1.7 cm, this area shown to represent a Bosniak category II cyst on prior imaging. LEFT renal cysts are stable. No dedicated follow-up imaging is suggested for renal cysts.  Urinary bladder is collapsed with Foley catheter in place. There is no hydronephrosis. Distal RIGHT ureteral calculi with similar appearance, largest measuring 11 mm just proximal to the RIGHT UVJ. Two additional calculi present in the RIGHT ureter. The largest 11 mm calculus may have progressed slightly towards the UVJ since previous imaging.  Stomach/Bowel: Signs of fecal impaction with large burden of stool in the rectum which shows wall thickening despite rectal distension. Perirectal stranding is similar to previous imaging. Perianal stranding also present. Rectum distended up to 9.5 cm. Slightly increased stool in the distal sigmoid. No signs of overt obstruction of upstream loops of bowel currently. Stomach under distended. No signs of small bowel dilation.  Vascular/Lymphatic:  Aortic atherosclerosis. No sign of aneurysm. Smooth contour of the IVC. There is no gastrohepatic or hepatoduodenal ligament lymphadenopathy. No retroperitoneal or mesenteric  lymphadenopathy.  No pelvic sidewall lymphadenopathy.  Atherosclerotic changes are moderate to marked.  Reproductive: Post hysterectomy.  Other: No free air or ascites.  No pneumatosis.  Musculoskeletal: Destructive changes about L2-3 with chronic appearance. Levo convexity of the spine and degenerative changes. Marked muscular atrophy.  Stage IV decubitus ulceration overlying the RIGHT ischium with sclerotic appearing ischial tuberosity suggesting chronic osteomyelitis. Small amount of gas tracking posterior to the acetabulum and along the RIGHT sacro tuberous ligament. Fluid containing tract extending along the  structures previously. Gas in continuity with the skin ulceration and perhaps with some packing material in the wound.  Adjacent perianal thickening/stranding in the setting of suspected fecal impaction. No well-formed fluid collection.  IMPRESSION: 1. Nephrostomy tube remaining in lower pole collecting systems of the RIGHT kidney, no signs of hydronephrosis with similar appearance of large staghorn calculus and overall similar appearance of distal ureteral calculi, perhaps mild distal migration of the largest calculus. No hydronephrosis. 2. Signs of stercoral proctitis and colitis due to fecal impaction similar to previous imaging. 3. Decubitus ulceration with local extension of fluid in gas along the acetabulum and sacrotuberous ligament. Fluid in these areas previously but no well-formed collection. Findings favored to represent gas tracking from the open wound into these areas but would suggest close attention on follow-up and correlation with direct clinical inspection to ensure no worsening and development of more aggressive soft tissue infection. 4. Aortic atherosclerosis. 5. Nodular hepatic contours raising the question of background liver disease. 6. Contralateral LEFT nephrolithiasis.  Aortic Atherosclerosis (ICD10-I70.0).   Electronically  Signed By: Isla Blind M.D. On: 02/23/2022 14:46   Assessment & Plan:    1. Nephrolithiasis (Primary) -CT stone study  2. Chronic indwelling foley -flush foley daily with 60cc of saline   No follow-ups on file.  Belvie Clara, MD  [facility]        [1]  Allergies Allergen Reactions   Codeine Nausea Only and Other (See Comments)    Allergic, per facility's paperwork   Invanz  [Ertapenem ] Other (See Comments)    Allergic, per facility's paperwork   Propranolol Hcl Other (See Comments)    Allergic, per facility's paperwork   Rocephin  [Ceftriaxone ] Hives and Other (See Comments)    Allergic, per facility's paperwork   Vancomycin  Hives and Other (See Comments)    Allergic, per facility's paperwork   "

## 2024-04-11 ENCOUNTER — Encounter: Payer: Self-pay | Admitting: Urology

## 2024-04-11 NOTE — Patient Instructions (Signed)

## 2024-04-19 ENCOUNTER — Ambulatory Visit (HOSPITAL_COMMUNITY)

## 2024-05-29 ENCOUNTER — Other Ambulatory Visit (HOSPITAL_COMMUNITY)

## 2024-10-21 ENCOUNTER — Ambulatory Visit: Admitting: Urology
